# Patient Record
Sex: Female | Born: 1950
Health system: Southern US, Community
[De-identification: ages and names within clinical notes are randomized; demographics above are authoritative.]

## PROBLEM LIST (undated history)

## (undated) DIAGNOSIS — C801 Malignant (primary) neoplasm, unspecified: Secondary | ICD-10-CM

## (undated) DIAGNOSIS — B20 Human immunodeficiency virus [HIV] disease: Secondary | ICD-10-CM

## (undated) DIAGNOSIS — G4700 Insomnia, unspecified: Secondary | ICD-10-CM

## (undated) DIAGNOSIS — B009 Herpesviral infection, unspecified: Secondary | ICD-10-CM

## (undated) DIAGNOSIS — B1920 Unspecified viral hepatitis C without hepatic coma: Secondary | ICD-10-CM

## (undated) DIAGNOSIS — K829 Disease of gallbladder, unspecified: Secondary | ICD-10-CM

## (undated) DIAGNOSIS — E213 Hyperparathyroidism, unspecified: Secondary | ICD-10-CM

## (undated) DIAGNOSIS — I10 Essential (primary) hypertension: Secondary | ICD-10-CM

## (undated) DIAGNOSIS — J189 Pneumonia, unspecified organism: Secondary | ICD-10-CM

## (undated) DIAGNOSIS — J309 Allergic rhinitis, unspecified: Secondary | ICD-10-CM

## (undated) DIAGNOSIS — Z21 Asymptomatic human immunodeficiency virus [HIV] infection status: Secondary | ICD-10-CM

## (undated) DIAGNOSIS — K8050 Calculus of bile duct without cholangitis or cholecystitis without obstruction: Secondary | ICD-10-CM

## (undated) HISTORY — DX: Allergic rhinitis, unspecified: J30.9

## (undated) HISTORY — DX: Human immunodeficiency virus (HIV) disease: B20

## (undated) HISTORY — DX: Insomnia, unspecified: G47.00

## (undated) HISTORY — DX: Pneumonia, unspecified organism: J18.9

## (undated) HISTORY — DX: Calculus of bile duct without cholangitis or cholecystitis without obstruction: K80.50

## (undated) HISTORY — DX: Herpesviral infection, unspecified: B00.9

## (undated) HISTORY — DX: Hyperparathyroidism, unspecified: E21.3

## (undated) HISTORY — PX: ECTOPIC PREGNANCY SURGERY: SHX613

## (undated) HISTORY — DX: Unspecified viral hepatitis C without hepatic coma: B19.20

## (undated) HISTORY — DX: Asymptomatic human immunodeficiency virus (hiv) infection status: Z21

---

## 2006-08-02 ENCOUNTER — Emergency Department (HOSPITAL_COMMUNITY): Admission: EM | Admit: 2006-08-02 | Discharge: 2006-08-02 | Payer: Self-pay | Admitting: Emergency Medicine

## 2006-08-03 ENCOUNTER — Inpatient Hospital Stay (HOSPITAL_COMMUNITY): Admission: EM | Admit: 2006-08-03 | Discharge: 2006-08-04 | Payer: Self-pay | Admitting: Emergency Medicine

## 2006-08-03 ENCOUNTER — Ambulatory Visit: Payer: Self-pay | Admitting: Infectious Diseases

## 2006-08-04 ENCOUNTER — Encounter (INDEPENDENT_AMBULATORY_CARE_PROVIDER_SITE_OTHER): Payer: Self-pay | Admitting: Internal Medicine

## 2006-08-04 DIAGNOSIS — B20 Human immunodeficiency virus [HIV] disease: Secondary | ICD-10-CM | POA: Insufficient documentation

## 2006-08-10 ENCOUNTER — Encounter (INDEPENDENT_AMBULATORY_CARE_PROVIDER_SITE_OTHER): Payer: Self-pay | Admitting: Internal Medicine

## 2006-11-20 ENCOUNTER — Emergency Department (HOSPITAL_COMMUNITY): Admission: EM | Admit: 2006-11-20 | Discharge: 2006-11-20 | Payer: Self-pay | Admitting: Emergency Medicine

## 2007-02-06 ENCOUNTER — Emergency Department (HOSPITAL_COMMUNITY): Admission: EM | Admit: 2007-02-06 | Discharge: 2007-02-06 | Payer: Self-pay | Admitting: Emergency Medicine

## 2007-02-15 ENCOUNTER — Emergency Department (HOSPITAL_COMMUNITY): Admission: EM | Admit: 2007-02-15 | Discharge: 2007-02-15 | Payer: Self-pay | Admitting: Emergency Medicine

## 2007-03-18 ENCOUNTER — Ambulatory Visit: Payer: Self-pay | Admitting: Internal Medicine

## 2007-03-18 ENCOUNTER — Encounter: Admission: RE | Admit: 2007-03-18 | Discharge: 2007-03-18 | Payer: Self-pay | Admitting: Internal Medicine

## 2007-03-18 LAB — CONVERTED CEMR LAB
ALT: 11 units/L (ref 0–35)
AST: 40 units/L — ABNORMAL HIGH (ref 0–37)
Albumin: 2.5 g/dL — ABNORMAL LOW (ref 3.5–5.2)
Alkaline Phosphatase: 59 units/L (ref 39–117)
BUN: 13 mg/dL (ref 6–23)
Basophils Absolute: 0 10*3/uL (ref 0.0–0.1)
Basophils Relative: 0 % (ref 0–1)
CD4 Count: 180 microliters
CO2: 23 meq/L (ref 19–32)
Calcium: 8 mg/dL — ABNORMAL LOW (ref 8.4–10.5)
Chloride: 104 meq/L (ref 96–112)
Creatinine, Ser: 0.68 mg/dL (ref 0.40–1.20)
Eosinophils Absolute: 0 10*3/uL (ref 0.0–0.7)
Eosinophils Relative: 1 % (ref 0–5)
Glucose, Bld: 81 mg/dL (ref 70–99)
HCT: 33.5 % — ABNORMAL LOW (ref 36.0–46.0)
HIV 1 RNA Quant: 65200 copies/mL — ABNORMAL HIGH (ref <50)
HIV 1 RNA Quant: 652000 copies/mL
HIV-1 RNA Quant, Log: 4.81 — ABNORMAL HIGH (ref <1.70)
Hemoglobin: 10.3 g/dL — ABNORMAL LOW (ref 12.0–15.0)
Lymphocytes Relative: 39 % (ref 12–46)
Lymphs Abs: 1 10*3/uL (ref 0.7–3.3)
MCHC: 30.7 g/dL (ref 30.0–36.0)
MCV: 88.4 fL (ref 78.0–100.0)
Monocytes Absolute: 0.2 10*3/uL (ref 0.2–0.7)
Monocytes Relative: 9 % (ref 3–11)
Neutro Abs: 1.3 10*3/uL — ABNORMAL LOW (ref 1.7–7.7)
Neutrophils Relative %: 50 % (ref 43–77)
Platelets: 153 10*3/uL (ref 150–400)
Potassium: 3.7 meq/L (ref 3.5–5.3)
RBC: 3.79 M/uL — ABNORMAL LOW (ref 3.87–5.11)
RDW: 15.7 % — ABNORMAL HIGH (ref 11.5–14.0)
Sodium: 131 meq/L — ABNORMAL LOW (ref 135–145)
Total Bilirubin: 0.4 mg/dL (ref 0.3–1.2)
Total Protein: 11.6 g/dL — ABNORMAL HIGH (ref 6.0–8.3)
WBC: 2.7 10*3/uL — ABNORMAL LOW (ref 4.0–10.5)

## 2007-05-07 ENCOUNTER — Encounter (INDEPENDENT_AMBULATORY_CARE_PROVIDER_SITE_OTHER): Payer: Self-pay | Admitting: *Deleted

## 2007-05-07 ENCOUNTER — Ambulatory Visit: Payer: Self-pay | Admitting: Internal Medicine

## 2007-05-07 DIAGNOSIS — Z8709 Personal history of other diseases of the respiratory system: Secondary | ICD-10-CM | POA: Insufficient documentation

## 2007-05-07 DIAGNOSIS — G4700 Insomnia, unspecified: Secondary | ICD-10-CM | POA: Insufficient documentation

## 2007-05-07 DIAGNOSIS — B59 Pneumocystosis: Secondary | ICD-10-CM | POA: Insufficient documentation

## 2007-05-07 DIAGNOSIS — K029 Dental caries, unspecified: Secondary | ICD-10-CM | POA: Insufficient documentation

## 2007-05-07 DIAGNOSIS — Z87898 Personal history of other specified conditions: Secondary | ICD-10-CM | POA: Insufficient documentation

## 2007-05-07 DIAGNOSIS — I10 Essential (primary) hypertension: Secondary | ICD-10-CM | POA: Insufficient documentation

## 2007-05-19 ENCOUNTER — Encounter (INDEPENDENT_AMBULATORY_CARE_PROVIDER_SITE_OTHER): Payer: Self-pay | Admitting: *Deleted

## 2007-05-26 ENCOUNTER — Encounter: Payer: Self-pay | Admitting: Internal Medicine

## 2007-06-05 ENCOUNTER — Encounter: Payer: Self-pay | Admitting: Internal Medicine

## 2007-06-24 ENCOUNTER — Ambulatory Visit: Payer: Self-pay | Admitting: Internal Medicine

## 2007-06-24 ENCOUNTER — Encounter: Admission: RE | Admit: 2007-06-24 | Discharge: 2007-06-24 | Payer: Self-pay | Admitting: Internal Medicine

## 2007-06-24 LAB — CONVERTED CEMR LAB
ALT: 11 units/L (ref 0–35)
AST: 41 units/L — ABNORMAL HIGH (ref 0–37)
Albumin: 2.5 g/dL — ABNORMAL LOW (ref 3.5–5.2)
Alkaline Phosphatase: 55 units/L (ref 39–117)
BUN: 17 mg/dL (ref 6–23)
Basophils Absolute: 0 10*3/uL (ref 0.0–0.1)
Basophils Relative: 0 % (ref 0–1)
CO2: 21 meq/L (ref 19–32)
Calcium: 7.9 mg/dL — ABNORMAL LOW (ref 8.4–10.5)
Chloride: 106 meq/L (ref 96–112)
Creatinine, Ser: 0.63 mg/dL (ref 0.40–1.20)
Eosinophils Absolute: 0.1 10*3/uL (ref 0.0–0.7)
Eosinophils Relative: 2 % (ref 0–5)
Glucose, Bld: 91 mg/dL (ref 70–99)
HCT: 32.8 % — ABNORMAL LOW (ref 36.0–46.0)
HIV 1 RNA Quant: 67300 copies/mL — ABNORMAL HIGH (ref <50)
HIV-1 RNA Quant, Log: 4.83 — ABNORMAL HIGH (ref <1.70)
Hemoglobin: 10.2 g/dL — ABNORMAL LOW (ref 12.0–15.0)
Lymphocytes Relative: 37 % (ref 12–46)
Lymphs Abs: 1 10*3/uL (ref 0.7–4.0)
MCHC: 31.1 g/dL (ref 30.0–36.0)
MCV: 87.5 fL (ref 78.0–100.0)
Monocytes Absolute: 0.3 10*3/uL (ref 0.1–1.0)
Monocytes Relative: 10 % (ref 3–12)
Neutro Abs: 1.4 10*3/uL — ABNORMAL LOW (ref 1.7–7.7)
Neutrophils Relative %: 50 % (ref 43–77)
Platelets: 155 10*3/uL (ref 150–400)
Potassium: 3.6 meq/L (ref 3.5–5.3)
RBC: 3.75 M/uL — ABNORMAL LOW (ref 3.87–5.11)
RDW: 15.3 % (ref 11.5–15.5)
Sodium: 135 meq/L (ref 135–145)
Total Bilirubin: 0.4 mg/dL (ref 0.3–1.2)
Total Protein: 12.8 g/dL — ABNORMAL HIGH (ref 6.0–8.3)
WBC: 2.8 10*3/uL — ABNORMAL LOW (ref 4.0–10.5)

## 2007-07-09 ENCOUNTER — Encounter: Payer: Self-pay | Admitting: Internal Medicine

## 2007-07-09 ENCOUNTER — Encounter (INDEPENDENT_AMBULATORY_CARE_PROVIDER_SITE_OTHER): Payer: Self-pay | Admitting: *Deleted

## 2007-07-09 ENCOUNTER — Ambulatory Visit: Payer: Self-pay | Admitting: Internal Medicine

## 2007-07-09 DIAGNOSIS — J309 Allergic rhinitis, unspecified: Secondary | ICD-10-CM | POA: Insufficient documentation

## 2007-07-09 LAB — CONVERTED CEMR LAB: Pap Smear: NORMAL

## 2007-07-14 ENCOUNTER — Encounter: Payer: Self-pay | Admitting: Internal Medicine

## 2007-08-26 ENCOUNTER — Ambulatory Visit: Payer: Self-pay | Admitting: Infectious Disease

## 2007-08-26 ENCOUNTER — Encounter: Payer: Self-pay | Admitting: Internal Medicine

## 2007-08-26 ENCOUNTER — Encounter: Admission: RE | Admit: 2007-08-26 | Discharge: 2007-08-26 | Payer: Self-pay | Admitting: Infectious Disease

## 2007-08-26 LAB — CONVERTED CEMR LAB
HIV 1 RNA Quant: 62900 copies/mL — ABNORMAL HIGH (ref <50)
HIV-1 RNA Quant, Log: 4.8 — ABNORMAL HIGH (ref <1.70)

## 2007-08-27 ENCOUNTER — Encounter: Payer: Self-pay | Admitting: Internal Medicine

## 2007-08-27 LAB — CONVERTED CEMR LAB
ALT: 12 units/L (ref 0–35)
AST: 40 units/L — ABNORMAL HIGH (ref 0–37)
Albumin: 2.2 g/dL — ABNORMAL LOW (ref 3.5–5.2)
Alkaline Phosphatase: 46 units/L (ref 39–117)
BUN: 16 mg/dL (ref 6–23)
Basophils Absolute: 0 10*3/uL (ref 0.0–0.1)
Basophils Relative: 1 % (ref 0–1)
CO2: 23 meq/L (ref 19–32)
Calcium: 7.7 mg/dL — ABNORMAL LOW (ref 8.4–10.5)
Chloride: 105 meq/L (ref 96–112)
Creatinine, Ser: 0.72 mg/dL (ref 0.40–1.20)
Eosinophils Absolute: 0 10*3/uL (ref 0.0–0.7)
Eosinophils Relative: 2 % (ref 0–5)
Glucose, Bld: 56 mg/dL — ABNORMAL LOW (ref 70–99)
HCT: 31.7 % — ABNORMAL LOW (ref 36.0–46.0)
Hemoglobin: 9.9 g/dL — ABNORMAL LOW (ref 12.0–15.0)
Lymphocytes Relative: 37 % (ref 12–46)
Lymphs Abs: 1 10*3/uL (ref 0.7–4.0)
MCHC: 31.2 g/dL (ref 30.0–36.0)
MCV: 87.3 fL (ref 78.0–100.0)
Monocytes Absolute: 0.2 10*3/uL (ref 0.1–1.0)
Monocytes Relative: 7 % (ref 3–12)
Neutro Abs: 1.5 10*3/uL — ABNORMAL LOW (ref 1.7–7.7)
Neutrophils Relative %: 54 % (ref 43–77)
Platelets: 157 10*3/uL (ref 150–400)
Potassium: 3.9 meq/L (ref 3.5–5.3)
RBC: 3.63 M/uL — ABNORMAL LOW (ref 3.87–5.11)
RDW: 16.1 % — ABNORMAL HIGH (ref 11.5–15.5)
Sodium: 132 meq/L — ABNORMAL LOW (ref 135–145)
Total Bilirubin: 0.5 mg/dL (ref 0.3–1.2)
Total Protein: 11.9 g/dL — ABNORMAL HIGH (ref 6.0–8.3)
WBC: 2.7 10*3/uL — ABNORMAL LOW (ref 4.0–10.5)

## 2007-09-06 ENCOUNTER — Encounter: Payer: Self-pay | Admitting: Internal Medicine

## 2007-09-08 ENCOUNTER — Ambulatory Visit: Payer: Self-pay | Admitting: Internal Medicine

## 2007-09-08 ENCOUNTER — Encounter (INDEPENDENT_AMBULATORY_CARE_PROVIDER_SITE_OTHER): Payer: Self-pay | Admitting: *Deleted

## 2007-10-07 ENCOUNTER — Encounter (INDEPENDENT_AMBULATORY_CARE_PROVIDER_SITE_OTHER): Payer: Self-pay | Admitting: *Deleted

## 2007-10-29 ENCOUNTER — Ambulatory Visit: Payer: Self-pay | Admitting: Internal Medicine

## 2007-10-29 ENCOUNTER — Encounter: Admission: RE | Admit: 2007-10-29 | Discharge: 2007-10-29 | Payer: Self-pay | Admitting: Internal Medicine

## 2007-10-29 LAB — CONVERTED CEMR LAB
ALT: 13 units/L (ref 0–35)
AST: 40 units/L — ABNORMAL HIGH (ref 0–37)
Albumin: 2.5 g/dL — ABNORMAL LOW (ref 3.5–5.2)
Alkaline Phosphatase: 50 units/L (ref 39–117)
BUN: 14 mg/dL (ref 6–23)
Basophils Absolute: 0 10*3/uL (ref 0.0–0.1)
Basophils Relative: 1 % (ref 0–1)
CO2: 22 meq/L (ref 19–32)
Calcium: 8.2 mg/dL — ABNORMAL LOW (ref 8.4–10.5)
Chloride: 104 meq/L (ref 96–112)
Creatinine, Ser: 0.78 mg/dL (ref 0.40–1.20)
Eosinophils Absolute: 0.1 10*3/uL (ref 0.0–0.7)
Eosinophils Relative: 2 % (ref 0–5)
Glucose, Bld: 78 mg/dL (ref 70–99)
HCT: 33.4 % — ABNORMAL LOW (ref 36.0–46.0)
HCV Ab: REACTIVE — AB
HIV 1 RNA Quant: 469 copies/mL — ABNORMAL HIGH (ref <50)
HIV-1 RNA Quant, Log: 2.67 — ABNORMAL HIGH (ref <1.70)
Hemoglobin: 10.5 g/dL — ABNORMAL LOW (ref 12.0–15.0)
Hep B S Ab: NEGATIVE
Hepatitis B Surface Ag: NEGATIVE
Lymphocytes Relative: 40 % (ref 12–46)
Lymphs Abs: 1.1 10*3/uL (ref 0.7–4.0)
MCHC: 31.4 g/dL (ref 30.0–36.0)
MCV: 87.4 fL (ref 78.0–100.0)
Monocytes Absolute: 0.2 10*3/uL (ref 0.1–1.0)
Monocytes Relative: 8 % (ref 3–12)
Neutro Abs: 1.3 10*3/uL — ABNORMAL LOW (ref 1.7–7.7)
Neutrophils Relative %: 48 % (ref 43–77)
Platelets: 150 10*3/uL (ref 150–400)
Potassium: 4.2 meq/L (ref 3.5–5.3)
RBC: 3.82 M/uL — ABNORMAL LOW (ref 3.87–5.11)
RDW: 16.6 % — ABNORMAL HIGH (ref 11.5–15.5)
Sodium: 132 meq/L — ABNORMAL LOW (ref 135–145)
Total Bilirubin: 2.8 mg/dL — ABNORMAL HIGH (ref 0.3–1.2)
Total Protein: 12.1 g/dL — ABNORMAL HIGH (ref 6.0–8.3)
WBC: 2.6 10*3/uL — ABNORMAL LOW (ref 4.0–10.5)

## 2007-11-10 ENCOUNTER — Ambulatory Visit: Payer: Self-pay | Admitting: Internal Medicine

## 2007-11-10 DIAGNOSIS — M545 Low back pain, unspecified: Secondary | ICD-10-CM | POA: Insufficient documentation

## 2007-11-11 ENCOUNTER — Telehealth (INDEPENDENT_AMBULATORY_CARE_PROVIDER_SITE_OTHER): Payer: Self-pay | Admitting: *Deleted

## 2007-11-17 ENCOUNTER — Telehealth: Payer: Self-pay | Admitting: Internal Medicine

## 2008-02-15 ENCOUNTER — Encounter (INDEPENDENT_AMBULATORY_CARE_PROVIDER_SITE_OTHER): Payer: Self-pay | Admitting: *Deleted

## 2008-02-23 ENCOUNTER — Telehealth: Payer: Self-pay

## 2008-03-08 ENCOUNTER — Ambulatory Visit: Payer: Self-pay | Admitting: Internal Medicine

## 2008-03-08 LAB — CONVERTED CEMR LAB
ALT: 13 units/L (ref 0–35)
AST: 35 units/L (ref 0–37)
Albumin: 2.9 g/dL — ABNORMAL LOW (ref 3.5–5.2)
Alkaline Phosphatase: 70 units/L (ref 39–117)
BUN: 12 mg/dL (ref 6–23)
Basophils Absolute: 0 10*3/uL (ref 0.0–0.1)
Basophils Relative: 1 % (ref 0–1)
CO2: 22 meq/L (ref 19–32)
Calcium: 8 mg/dL — ABNORMAL LOW (ref 8.4–10.5)
Chloride: 105 meq/L (ref 96–112)
Creatinine, Ser: 0.77 mg/dL (ref 0.40–1.20)
Eosinophils Absolute: 0 10*3/uL (ref 0.0–0.7)
Eosinophils Relative: 1 % (ref 0–5)
Glucose, Bld: 125 mg/dL — ABNORMAL HIGH (ref 70–99)
HCT: 33.9 % — ABNORMAL LOW (ref 36.0–46.0)
HIV 1 RNA Quant: 113000 copies/mL — ABNORMAL HIGH (ref <50)
HIV-1 RNA Quant, Log: 5.05 — ABNORMAL HIGH (ref <1.70)
Hemoglobin: 11.1 g/dL — ABNORMAL LOW (ref 12.0–15.0)
Lymphocytes Relative: 39 % (ref 12–46)
Lymphs Abs: 0.8 10*3/uL (ref 0.7–4.0)
MCHC: 32.7 g/dL (ref 30.0–36.0)
MCV: 86.7 fL (ref 78.0–100.0)
Monocytes Absolute: 0.2 10*3/uL (ref 0.1–1.0)
Monocytes Relative: 8 % (ref 3–12)
Neutro Abs: 1.1 10*3/uL — ABNORMAL LOW (ref 1.7–7.7)
Neutrophils Relative %: 52 % (ref 43–77)
Platelets: 138 10*3/uL — ABNORMAL LOW (ref 150–400)
Potassium: 3.3 meq/L — ABNORMAL LOW (ref 3.5–5.3)
RBC: 3.91 M/uL (ref 3.87–5.11)
RDW: 14.1 % (ref 11.5–15.5)
Sodium: 133 meq/L — ABNORMAL LOW (ref 135–145)
Total Bilirubin: 0.7 mg/dL (ref 0.3–1.2)
Total Protein: 10.9 g/dL — ABNORMAL HIGH (ref 6.0–8.3)
WBC: 2.1 10*3/uL — ABNORMAL LOW (ref 4.0–10.5)

## 2008-03-13 ENCOUNTER — Encounter: Payer: Self-pay | Admitting: Internal Medicine

## 2008-03-22 ENCOUNTER — Ambulatory Visit: Payer: Self-pay | Admitting: Internal Medicine

## 2008-03-27 LAB — CONVERTED CEMR LAB
Chlamydia, Swab/Urine, PCR: NEGATIVE
GC Probe Amp, Urine: NEGATIVE

## 2008-04-06 ENCOUNTER — Telehealth (INDEPENDENT_AMBULATORY_CARE_PROVIDER_SITE_OTHER): Payer: Self-pay | Admitting: *Deleted

## 2008-04-07 ENCOUNTER — Ambulatory Visit: Payer: Self-pay | Admitting: Internal Medicine

## 2008-04-07 DIAGNOSIS — R5381 Other malaise: Secondary | ICD-10-CM | POA: Insufficient documentation

## 2008-04-07 DIAGNOSIS — R5383 Other fatigue: Secondary | ICD-10-CM

## 2008-04-07 DIAGNOSIS — N898 Other specified noninflammatory disorders of vagina: Secondary | ICD-10-CM | POA: Insufficient documentation

## 2008-04-10 LAB — CONVERTED CEMR LAB
Candida species: NEGATIVE
Gardnerella vaginalis: POSITIVE — AB
Trichomonal Vaginitis: NEGATIVE

## 2008-04-21 ENCOUNTER — Encounter (INDEPENDENT_AMBULATORY_CARE_PROVIDER_SITE_OTHER): Payer: Self-pay | Admitting: Licensed Clinical Social Worker

## 2008-05-02 ENCOUNTER — Ambulatory Visit: Payer: Self-pay | Admitting: Internal Medicine

## 2008-05-02 LAB — CONVERTED CEMR LAB
ALT: 8 units/L (ref 0–35)
AST: 25 units/L (ref 0–37)
Albumin: 2.7 g/dL — ABNORMAL LOW (ref 3.5–5.2)
Alkaline Phosphatase: 58 units/L (ref 39–117)
BUN: 9 mg/dL (ref 6–23)
Basophils Absolute: 0 10*3/uL (ref 0.0–0.1)
Basophils Relative: 1 % (ref 0–1)
CO2: 20 meq/L (ref 19–32)
Calcium: 7.6 mg/dL — ABNORMAL LOW (ref 8.4–10.5)
Chloride: 105 meq/L (ref 96–112)
Cholesterol: 93 mg/dL (ref 0–200)
Creatinine, Ser: 0.77 mg/dL (ref 0.40–1.20)
Eosinophils Absolute: 0.1 10*3/uL (ref 0.0–0.7)
Eosinophils Relative: 2 % (ref 0–5)
Glucose, Bld: 78 mg/dL (ref 70–99)
HCT: 33.9 % — ABNORMAL LOW (ref 36.0–46.0)
HDL: 25 mg/dL — ABNORMAL LOW (ref >39)
HIV 1 RNA Quant: 63 copies/mL — ABNORMAL HIGH (ref <48)
HIV-1 RNA Quant, Log: 1.8 — ABNORMAL HIGH (ref <1.68)
Hemoglobin: 10.6 g/dL — ABNORMAL LOW (ref 12.0–15.0)
LDL Cholesterol: 54 mg/dL (ref 0–99)
Lymphocytes Relative: 35 % (ref 12–46)
Lymphs Abs: 0.9 10*3/uL (ref 0.7–4.0)
MCHC: 31.3 g/dL (ref 30.0–36.0)
MCV: 88.5 fL (ref 78.0–100.0)
Monocytes Absolute: 0.2 10*3/uL (ref 0.1–1.0)
Monocytes Relative: 8 % (ref 3–12)
Neutro Abs: 1.3 10*3/uL — ABNORMAL LOW (ref 1.7–7.7)
Neutrophils Relative %: 54 % (ref 43–77)
Platelets: 176 10*3/uL (ref 150–400)
Potassium: 3.8 meq/L (ref 3.5–5.3)
RBC: 3.83 M/uL — ABNORMAL LOW (ref 3.87–5.11)
RDW: 15.3 % (ref 11.5–15.5)
Sodium: 133 meq/L — ABNORMAL LOW (ref 135–145)
Total Bilirubin: 0.6 mg/dL (ref 0.3–1.2)
Total CHOL/HDL Ratio: 3.7
Total Protein: 11 g/dL — ABNORMAL HIGH (ref 6.0–8.3)
Triglycerides: 71 mg/dL (ref <150)
VLDL: 14 mg/dL (ref 0–40)
WBC: 2.4 10*3/uL — ABNORMAL LOW (ref 4.0–10.5)

## 2008-05-24 ENCOUNTER — Ambulatory Visit: Payer: Self-pay | Admitting: Internal Medicine

## 2008-06-01 ENCOUNTER — Telehealth: Payer: Self-pay | Admitting: Internal Medicine

## 2008-06-06 ENCOUNTER — Encounter: Payer: Self-pay | Admitting: Internal Medicine

## 2008-06-21 ENCOUNTER — Encounter (INDEPENDENT_AMBULATORY_CARE_PROVIDER_SITE_OTHER): Payer: Self-pay | Admitting: *Deleted

## 2008-08-23 ENCOUNTER — Ambulatory Visit: Payer: Self-pay | Admitting: Internal Medicine

## 2008-08-23 LAB — CONVERTED CEMR LAB
ALT: 10 units/L (ref 0–35)
AST: 37 units/L (ref 0–37)
Albumin: 2.8 g/dL — ABNORMAL LOW (ref 3.5–5.2)
Alkaline Phosphatase: 69 units/L (ref 39–117)
BUN: 12 mg/dL (ref 6–23)
Basophils Absolute: 0 10*3/uL (ref 0.0–0.1)
Basophils Relative: 1 % (ref 0–1)
CO2: 24 meq/L (ref 19–32)
Calcium: 7.9 mg/dL — ABNORMAL LOW (ref 8.4–10.5)
Chloride: 107 meq/L (ref 96–112)
Creatinine, Ser: 0.81 mg/dL (ref 0.40–1.20)
Eosinophils Absolute: 0 10*3/uL (ref 0.0–0.7)
Eosinophils Relative: 1 % (ref 0–5)
GFR calc Af Amer: >60 mL/min (ref >60)
GFR calc non Af Amer: >60 mL/min (ref >60)
Glucose, Bld: 100 mg/dL — ABNORMAL HIGH (ref 70–99)
HCT: 29.9 % — ABNORMAL LOW (ref 36.0–46.0)
HIV 1 RNA Quant: 71800 copies/mL — ABNORMAL HIGH (ref <48)
HIV-1 RNA Quant, Log: 4.86 — ABNORMAL HIGH (ref <1.68)
Hemoglobin: 10.3 g/dL — ABNORMAL LOW (ref 12.0–15.0)
Lymphocytes Relative: 43 % (ref 12–46)
Lymphs Abs: 1 10*3/uL (ref 0.7–4.0)
MCHC: 34.4 g/dL (ref 30.0–36.0)
MCV: 86.7 fL (ref 78.0–100.0)
Monocytes Absolute: 0.2 10*3/uL (ref 0.1–1.0)
Monocytes Relative: 7 % (ref 3–12)
Neutro Abs: 1.1 10*3/uL — ABNORMAL LOW (ref 1.7–7.7)
Neutrophils Relative %: 48 % (ref 43–77)
Platelets: 137 10*3/uL — ABNORMAL LOW (ref 150–400)
Potassium: 3.6 meq/L (ref 3.5–5.3)
RBC: 3.45 M/uL — ABNORMAL LOW (ref 3.87–5.11)
RDW: 14 % (ref 11.5–15.5)
Sodium: 139 meq/L (ref 135–145)
Total Bilirubin: 0.5 mg/dL (ref 0.3–1.2)
Total Protein: 10.7 g/dL — ABNORMAL HIGH (ref 6.0–8.3)
WBC: 2.3 10*3/uL — ABNORMAL LOW (ref 4.0–10.5)

## 2008-09-06 ENCOUNTER — Ambulatory Visit: Payer: Self-pay | Admitting: Internal Medicine

## 2008-09-15 ENCOUNTER — Encounter: Payer: Self-pay | Admitting: Internal Medicine

## 2008-09-15 ENCOUNTER — Ambulatory Visit: Payer: Self-pay | Admitting: Internal Medicine

## 2008-09-22 ENCOUNTER — Encounter: Payer: Self-pay | Admitting: Internal Medicine

## 2008-09-25 ENCOUNTER — Emergency Department (HOSPITAL_COMMUNITY): Admission: EM | Admit: 2008-09-25 | Discharge: 2008-09-26 | Payer: Self-pay | Admitting: Emergency Medicine

## 2008-10-04 ENCOUNTER — Telehealth (INDEPENDENT_AMBULATORY_CARE_PROVIDER_SITE_OTHER): Payer: Self-pay | Admitting: *Deleted

## 2008-12-05 ENCOUNTER — Ambulatory Visit: Payer: Self-pay | Admitting: Internal Medicine

## 2008-12-05 LAB — CONVERTED CEMR LAB
ALT: 58 units/L — ABNORMAL HIGH (ref 0–35)
AST: 118 units/L — ABNORMAL HIGH (ref 0–37)
Albumin: 2.9 g/dL — ABNORMAL LOW (ref 3.5–5.2)
Alkaline Phosphatase: 81 units/L (ref 39–117)
BUN: 12 mg/dL (ref 6–23)
Basophils Absolute: 0 10*3/uL (ref 0.0–0.1)
Basophils Relative: 0 % (ref 0–1)
CO2: 23 meq/L (ref 19–32)
Calcium: 8.3 mg/dL — ABNORMAL LOW (ref 8.4–10.5)
Chloride: 102 meq/L (ref 96–112)
Creatinine, Ser: 0.95 mg/dL (ref 0.40–1.20)
Eosinophils Absolute: 0 10*3/uL (ref 0.0–0.7)
Eosinophils Relative: 0 % (ref 0–5)
Glucose, Bld: 96 mg/dL (ref 70–99)
HCT: 37.2 % (ref 36.0–46.0)
HIV 1 RNA Quant: <48 copies/mL (ref <48)
HIV-1 RNA Quant, Log: <1.68 (ref <1.68)
Hemoglobin: 11.7 g/dL — ABNORMAL LOW (ref 12.0–15.0)
Lymphocytes Relative: 29 % (ref 12–46)
Lymphs Abs: 0.7 10*3/uL (ref 0.7–4.0)
MCHC: 31.5 g/dL (ref 30.0–36.0)
MCV: 91.2 fL (ref 78.0–100.0)
Monocytes Absolute: 0.2 10*3/uL (ref 0.1–1.0)
Monocytes Relative: 7 % (ref 3–12)
Neutro Abs: 1.4 10*3/uL — ABNORMAL LOW (ref 1.7–7.7)
Neutrophils Relative %: 63 % (ref 43–77)
Platelets: 140 10*3/uL — ABNORMAL LOW (ref 150–400)
Potassium: 3.6 meq/L (ref 3.5–5.3)
RBC: 4.08 M/uL (ref 3.87–5.11)
RDW: 15.4 % (ref 11.5–15.5)
Sodium: 132 meq/L — ABNORMAL LOW (ref 135–145)
Total Bilirubin: 0.5 mg/dL (ref 0.3–1.2)
Total Protein: 11.2 g/dL — ABNORMAL HIGH (ref 6.0–8.3)
WBC: 2.3 10*3/uL — ABNORMAL LOW (ref 4.0–10.5)

## 2008-12-19 ENCOUNTER — Ambulatory Visit: Payer: Self-pay | Admitting: Internal Medicine

## 2008-12-19 DIAGNOSIS — B182 Chronic viral hepatitis C: Secondary | ICD-10-CM | POA: Insufficient documentation

## 2008-12-21 ENCOUNTER — Telehealth (INDEPENDENT_AMBULATORY_CARE_PROVIDER_SITE_OTHER): Payer: Self-pay | Admitting: *Deleted

## 2008-12-29 ENCOUNTER — Telehealth (INDEPENDENT_AMBULATORY_CARE_PROVIDER_SITE_OTHER): Payer: Self-pay | Admitting: *Deleted

## 2009-01-19 ENCOUNTER — Ambulatory Visit (HOSPITAL_COMMUNITY): Admission: RE | Admit: 2009-01-19 | Discharge: 2009-01-19 | Payer: Self-pay | Admitting: Internal Medicine

## 2009-03-20 ENCOUNTER — Ambulatory Visit: Payer: Self-pay | Admitting: Internal Medicine

## 2009-03-20 LAB — CONVERTED CEMR LAB
ALT: 61 units/L — ABNORMAL HIGH (ref 0–35)
AST: 119 units/L — ABNORMAL HIGH (ref 0–37)
Albumin: 3.3 g/dL — ABNORMAL LOW (ref 3.5–5.2)
Alkaline Phosphatase: 81 units/L (ref 39–117)
BUN: 12 mg/dL (ref 6–23)
Basophils Absolute: 0 10*3/uL (ref 0.0–0.1)
Basophils Relative: 0 % (ref 0–1)
CO2: 21 meq/L (ref 19–32)
Calcium: 8.7 mg/dL (ref 8.4–10.5)
Chloride: 105 meq/L (ref 96–112)
Cholesterol: 109 mg/dL (ref 0–200)
Creatinine, Ser: 0.9 mg/dL (ref 0.40–1.20)
Eosinophils Absolute: 0 10*3/uL (ref 0.0–0.7)
Eosinophils Relative: 1 % (ref 0–5)
Glucose, Bld: 81 mg/dL (ref 70–99)
HCT: 34.7 % — ABNORMAL LOW (ref 36.0–46.0)
HDL: 38 mg/dL — ABNORMAL LOW (ref >39)
HIV 1 RNA Quant: <48 copies/mL (ref <48)
HIV-1 RNA Quant, Log: <1.68 (ref <1.68)
Hemoglobin: 11.2 g/dL — ABNORMAL LOW (ref 12.0–15.0)
LDL Cholesterol: 52 mg/dL (ref 0–99)
Lymphocytes Relative: 25 % (ref 12–46)
Lymphs Abs: 0.6 10*3/uL — ABNORMAL LOW (ref 0.7–4.0)
MCHC: 32.3 g/dL (ref 30.0–36.0)
MCV: 92.5 fL (ref >=78.0)
Monocytes Absolute: 0.3 10*3/uL (ref 0.1–1.0)
Monocytes Relative: 11 % (ref 3–12)
Neutro Abs: 1.4 10*3/uL — ABNORMAL LOW (ref 1.7–7.7)
Neutrophils Relative %: 62 % (ref 43–77)
Platelets: 159 10*3/uL (ref 150–400)
Potassium: 3.7 meq/L (ref 3.5–5.3)
RBC: 3.75 M/uL — ABNORMAL LOW (ref 3.87–5.11)
RDW: 14.1 % (ref 11.5–15.5)
Sodium: 136 meq/L (ref 135–145)
Total Bilirubin: 0.6 mg/dL (ref 0.3–1.2)
Total CHOL/HDL Ratio: 2.9
Total Protein: 10.7 g/dL — ABNORMAL HIGH (ref 6.0–8.3)
Triglycerides: 97 mg/dL (ref <150)
VLDL: 19 mg/dL (ref 0–40)
WBC: 2.2 10*3/uL — ABNORMAL LOW (ref 4.0–10.5)

## 2009-04-04 ENCOUNTER — Ambulatory Visit: Payer: Self-pay | Admitting: Internal Medicine

## 2009-06-05 ENCOUNTER — Telehealth (INDEPENDENT_AMBULATORY_CARE_PROVIDER_SITE_OTHER): Payer: Self-pay | Admitting: *Deleted

## 2009-06-06 ENCOUNTER — Encounter: Payer: Self-pay | Admitting: Internal Medicine

## 2009-07-02 ENCOUNTER — Ambulatory Visit: Payer: Self-pay | Admitting: Internal Medicine

## 2009-07-02 LAB — CONVERTED CEMR LAB
ALT: 11 units/L (ref 0–35)
AST: 27 units/L (ref 0–37)
Albumin: 3.5 g/dL (ref 3.5–5.2)
Alkaline Phosphatase: 87 units/L (ref 39–117)
BUN: 15 mg/dL (ref 6–23)
Basophils Absolute: 0 10*3/uL (ref 0.0–0.1)
Basophils Relative: 0 % (ref 0–1)
CO2: 26 meq/L (ref 19–32)
Calcium: 8.9 mg/dL (ref 8.4–10.5)
Chloride: 104 meq/L (ref 96–112)
Creatinine, Ser: 0.77 mg/dL (ref 0.40–1.20)
Eosinophils Absolute: 0 10*3/uL (ref 0.0–0.7)
Eosinophils Relative: 2 % (ref 0–5)
Glucose, Bld: 88 mg/dL (ref 70–99)
HCT: 36.2 % (ref 36.0–46.0)
HIV 1 RNA Quant: <48 copies/mL (ref <48)
HIV-1 RNA Quant, Log: <1.68 (ref <1.68)
Hemoglobin: 11.8 g/dL — ABNORMAL LOW (ref 12.0–15.0)
Lymphocytes Relative: 31 % (ref 12–46)
Lymphs Abs: 0.8 10*3/uL (ref 0.7–4.0)
MCHC: 32.6 g/dL (ref 30.0–36.0)
MCV: 89.4 fL (ref >=78.0)
Monocytes Absolute: 0.3 10*3/uL (ref 0.1–1.0)
Monocytes Relative: 9 % (ref 3–12)
Neutro Abs: 1.5 10*3/uL — ABNORMAL LOW (ref 1.7–7.7)
Neutrophils Relative %: 58 % (ref 43–77)
Platelets: 168 10*3/uL (ref 150–400)
Potassium: 4.3 meq/L (ref 3.5–5.3)
RBC: 4.05 M/uL (ref 3.87–5.11)
RDW: 13.5 % (ref 11.5–15.5)
Sodium: 136 meq/L (ref 135–145)
Total Bilirubin: 0.6 mg/dL (ref 0.3–1.2)
Total Protein: 10.4 g/dL — ABNORMAL HIGH (ref 6.0–8.3)
WBC: 2.7 10*3/uL — ABNORMAL LOW (ref 4.0–10.5)

## 2009-07-18 ENCOUNTER — Ambulatory Visit: Payer: Self-pay | Admitting: Internal Medicine

## 2009-07-18 DIAGNOSIS — R05 Cough: Secondary | ICD-10-CM | POA: Insufficient documentation

## 2009-07-18 DIAGNOSIS — R059 Cough, unspecified: Secondary | ICD-10-CM | POA: Insufficient documentation

## 2009-10-17 ENCOUNTER — Ambulatory Visit: Payer: Self-pay | Admitting: Internal Medicine

## 2009-10-17 LAB — CONVERTED CEMR LAB
ALT: 14 units/L (ref 0–35)
AST: 28 units/L (ref 0–37)
Albumin: 3.2 g/dL — ABNORMAL LOW (ref 3.5–5.2)
Alkaline Phosphatase: 91 units/L (ref 39–117)
BUN: 13 mg/dL (ref 6–23)
Basophils Absolute: 0 10*3/uL (ref 0.0–0.1)
Basophils Relative: 1 % (ref 0–1)
CO2: 24 meq/L (ref 19–32)
Calcium: 8.9 mg/dL (ref 8.4–10.5)
Chloride: 107 meq/L (ref 96–112)
Creatinine, Ser: 0.81 mg/dL (ref 0.40–1.20)
Eosinophils Absolute: 0.1 10*3/uL (ref 0.0–0.7)
Eosinophils Relative: 2 % (ref 0–5)
Glucose, Bld: 94 mg/dL (ref 70–99)
HCT: 36.1 % (ref 36.0–46.0)
HIV 1 RNA Quant: <48 copies/mL (ref <48)
HIV-1 RNA Quant, Log: <1.68 (ref <1.68)
Hemoglobin: 11.6 g/dL — ABNORMAL LOW (ref 12.0–15.0)
Lymphocytes Relative: 28 % (ref 12–46)
Lymphs Abs: 0.9 10*3/uL (ref 0.7–4.0)
MCHC: 32.1 g/dL (ref 30.0–36.0)
MCV: 90.7 fL (ref 78.0–100.0)
Monocytes Absolute: 0.2 10*3/uL (ref 0.1–1.0)
Monocytes Relative: 8 % (ref 3–12)
Neutro Abs: 1.9 10*3/uL (ref 1.7–7.7)
Neutrophils Relative %: 62 % (ref 43–77)
Platelets: 159 10*3/uL (ref 150–400)
Potassium: 3.5 meq/L (ref 3.5–5.3)
RBC: 3.98 M/uL (ref 3.87–5.11)
RDW: 14.1 % (ref 11.5–15.5)
Sodium: 139 meq/L (ref 135–145)
Total Bilirubin: 0.5 mg/dL (ref 0.3–1.2)
Total Protein: 8.9 g/dL — ABNORMAL HIGH (ref 6.0–8.3)
WBC: 3.1 10*3/uL — ABNORMAL LOW (ref 4.0–10.5)

## 2009-10-31 ENCOUNTER — Ambulatory Visit: Payer: Self-pay | Admitting: Internal Medicine

## 2009-11-09 ENCOUNTER — Telehealth (INDEPENDENT_AMBULATORY_CARE_PROVIDER_SITE_OTHER): Payer: Self-pay | Admitting: *Deleted

## 2009-12-21 ENCOUNTER — Ambulatory Visit: Payer: Self-pay | Admitting: Infectious Diseases

## 2009-12-21 DIAGNOSIS — R8789 Other abnormal findings in specimens from female genital organs: Secondary | ICD-10-CM | POA: Insufficient documentation

## 2010-01-01 ENCOUNTER — Encounter: Payer: Self-pay | Admitting: Internal Medicine

## 2010-01-08 ENCOUNTER — Encounter: Payer: Self-pay | Admitting: Internal Medicine

## 2010-01-08 LAB — CONVERTED CEMR LAB

## 2010-02-06 ENCOUNTER — Ambulatory Visit: Payer: Self-pay | Admitting: Internal Medicine

## 2010-02-06 LAB — CONVERTED CEMR LAB
ALT: 12 units/L (ref 0–35)
AST: 30 units/L (ref 0–37)
Albumin: 3.9 g/dL (ref 3.5–5.2)
Alkaline Phosphatase: 90 units/L (ref 39–117)
BUN: 13 mg/dL (ref 6–23)
Basophils Absolute: 0.1 10*3/uL (ref 0.0–0.1)
Basophils Relative: 1 % (ref 0–1)
CO2: 23 meq/L (ref 19–32)
Calcium: 9 mg/dL (ref 8.4–10.5)
Chloride: 104 meq/L (ref 96–112)
Creatinine, Ser: 0.8 mg/dL (ref 0.40–1.20)
Eosinophils Absolute: 0 10*3/uL (ref 0.0–0.7)
Eosinophils Relative: 1 % (ref 0–5)
Glucose, Bld: 83 mg/dL (ref 70–99)
HCT: 39.7 % (ref 36.0–46.0)
HIV 1 RNA Quant: <20 copies/mL (ref <20)
HIV-1 RNA Quant, Log: <1.3 (ref <1.30)
Hemoglobin: 12.8 g/dL (ref 12.0–15.0)
Lymphocytes Relative: 27 % (ref 12–46)
Lymphs Abs: 1.2 10*3/uL (ref 0.7–4.0)
MCHC: 32.2 g/dL (ref 30.0–36.0)
MCV: 90.2 fL (ref 78.0–100.0)
Monocytes Absolute: 0.3 10*3/uL (ref 0.1–1.0)
Monocytes Relative: 7 % (ref 3–12)
Neutro Abs: 2.8 10*3/uL (ref 1.7–7.7)
Neutrophils Relative %: 64 % (ref 43–77)
Platelets: 157 10*3/uL (ref 150–400)
Potassium: 3.9 meq/L (ref 3.5–5.3)
RBC: 4.4 M/uL (ref 3.87–5.11)
RDW: 13.2 % (ref 11.5–15.5)
Sodium: 139 meq/L (ref 135–145)
Total Bilirubin: 0.9 mg/dL (ref 0.3–1.2)
Total Protein: 9.9 g/dL — ABNORMAL HIGH (ref 6.0–8.3)
WBC: 4.3 10*3/uL (ref 4.0–10.5)

## 2010-02-11 ENCOUNTER — Telehealth: Payer: Self-pay | Admitting: Internal Medicine

## 2010-02-12 ENCOUNTER — Ambulatory Visit: Payer: Self-pay | Admitting: Internal Medicine

## 2010-03-01 ENCOUNTER — Telehealth (INDEPENDENT_AMBULATORY_CARE_PROVIDER_SITE_OTHER): Payer: Self-pay | Admitting: *Deleted

## 2010-03-13 ENCOUNTER — Ambulatory Visit: Payer: Self-pay | Admitting: Obstetrics and Gynecology

## 2010-03-13 ENCOUNTER — Other Ambulatory Visit: Admission: RE | Admit: 2010-03-13 | Discharge: 2010-03-13 | Payer: Self-pay | Admitting: Obstetrics and Gynecology

## 2010-06-11 ENCOUNTER — Encounter: Payer: Self-pay | Admitting: Infectious Diseases

## 2010-06-11 ENCOUNTER — Ambulatory Visit
Admission: RE | Admit: 2010-06-11 | Discharge: 2010-06-11 | Payer: Self-pay | Source: Home / Self Care | Attending: Infectious Diseases | Admitting: Infectious Diseases

## 2010-06-11 ENCOUNTER — Encounter: Payer: Self-pay | Admitting: Internal Medicine

## 2010-06-11 LAB — CONVERTED CEMR LAB
ALT: 10 units/L (ref 0–35)
AST: 23 units/L (ref 0–37)
Albumin: 3.8 g/dL (ref 3.5–5.2)
Alkaline Phosphatase: 105 units/L (ref 39–117)
BUN: 14 mg/dL (ref 6–23)
Basophils Absolute: 0 10*3/uL (ref 0.0–0.1)
Basophils Relative: 0 % (ref 0–1)
CO2: 26 meq/L (ref 19–32)
Calcium: 9.5 mg/dL (ref 8.4–10.5)
Chloride: 106 meq/L (ref 96–112)
Creatinine, Ser: 1.1 mg/dL (ref 0.40–1.20)
Eosinophils Absolute: 0 10*3/uL (ref 0.0–0.7)
Eosinophils Relative: 1 % (ref 0–5)
Glucose, Bld: 98 mg/dL (ref 70–99)
HCT: 38.6 % (ref 36.0–46.0)
HIV 1 RNA Quant: <20 copies/mL (ref <20)
HIV-1 RNA Quant, Log: <1.3 (ref <1.30)
Hemoglobin: 12.6 g/dL (ref 12.0–15.0)
Lymphocytes Relative: 27 % (ref 12–46)
Lymphs Abs: 1.3 10*3/uL (ref 0.7–4.0)
MCHC: 32.6 g/dL (ref 30.0–36.0)
MCV: 88.5 fL (ref 78.0–100.0)
Monocytes Absolute: 0.2 10*3/uL (ref 0.1–1.0)
Monocytes Relative: 5 % (ref 3–12)
Neutro Abs: 3.2 10*3/uL (ref 1.7–7.7)
Neutrophils Relative %: 68 % (ref 43–77)
Platelets: 185 10*3/uL (ref 150–400)
Potassium: 4.9 meq/L (ref 3.5–5.3)
RBC: 4.36 M/uL (ref 3.87–5.11)
RDW: 13.5 % (ref 11.5–15.5)
Sodium: 139 meq/L (ref 135–145)
Total Bilirubin: 0.7 mg/dL (ref 0.3–1.2)
Total Protein: 9.3 g/dL — ABNORMAL HIGH (ref 6.0–8.3)
WBC: 4.8 10*3/uL (ref 4.0–10.5)

## 2010-06-12 LAB — T-HELPER CELL (CD4) - (RCID CLINIC ONLY)
CD4 % Helper T Cell: 23 % — ABNORMAL LOW (ref 33–55)
CD4 T Cell Abs: 280 uL — ABNORMAL LOW (ref 400–2700)

## 2010-06-18 NOTE — Progress Notes (Signed)
Summary: Not taking Mepron  Phone Note Call from Patient   Caller: Patient Reason for Call: Talk to Doctor Summary of Call: Patient called this morning because she forgot to ask Dr. Philipp Deputy at her visit why she is still taking the Mepron suspension.  I informed her that her doctor has her taking this medication as an antibiotic.  I also stated that she must be taking this particular med because her counts may be low.  She informed me that she just cannont take this medication any longer and said that she has not be taking it for sometime now.  I told her that I will inform her doctor.  Please let me know if you want me to discontinue medication so I may let the pharmacy know not to keep sending medication under [place]. Initial call taken by: Paulo Fruit  BS,CPht II,MPH,  November 11, 2007 9:15 AM  Follow-up for Phone Call        she shoul be taking to prevent PCP because CD4ct is <200 and she is allergic to bactrim and dapsone Follow-up by: Yisroel Ramming MD,  November 11, 2007 10:18 AM  Additional Follow-up for Phone Call Additional follow up Details #1::        Tried to contact patient to reiterate the same information that you have dictated about the Mepron.  I told her this same info when she called the first time, but wanted to make you aware that she has not been taking it.  I left her a message to contact Byrd Hesselbach at [phone] or Tammy at [phone]. (We will reemphasize the importance). Additional Follow-up by: Paulo Fruit  BS,CPht II,MPH,  November 11, 2007 10:47 AM

## 2010-06-18 NOTE — Miscellaneous (Signed)
Summary: Orders Update - GYN Referral  Clinical Lists Changes  Problems: Added new problem of PAP SMEAR, LGSIL, ABNORMAL (ICD-795.09) Orders: Added new Referral order of Gynecologic Referral (Gyn) - Signed

## 2010-06-18 NOTE — Miscellaneous (Signed)
Summary: RW  Clinical Lists Changes  Observations: Added new observation of YEARAIDSPOS: AIDS 2008 (05/26/2007 16:49)

## 2010-06-18 NOTE — Medication Information (Signed)
Summary: Denial  Denial   Imported By: Florinda Marker 06/06/2009 15:23:23  _____________________________________________________________________  External Attachment:    Type:   Image     Comment:   External Document

## 2010-06-18 NOTE — Assessment & Plan Note (Signed)
Summary: 3 MONTH F/U/CH   CC:  follow-up visit and chest congestion and cough.  History of Present Illness: Pt had a bad cold a couple of weeks ago. she still had a cough taht is productive of [place] phlegm.  No fever. No missed doses of her HIV meds.  Preventive Screening-Counseling & Management  Alcohol-Tobacco     Alcohol drinks/day: 0     Alcohol type: wine, occass     Smoking Status: never     Year Quit: 40 years ago, occassionally     Passive Smoke Exposure: yes  Caffeine-Diet-Exercise     Caffeine use/day: 2     Does Patient Exercise: yes     Type of exercise: walking at work, CNA     Times/week: 5  Hep-HIV-STD-Contraception     HIV Risk: no  Safety-Violence-Falls     Seat Belt Use: yes      Sexual History:  currently monogamous.        Drug Use:  never.        Blood Transfusions:  no.        Travel History:  no.     Updated Prior Medication List: AMBIEN 10 MG TABS (ZOLPIDEM TARTRATE) Take 1 tablet by mouth at bedtime TRUVADA 200-300 MG TABS (EMTRICITABINE-TENOFOVIR) Take 1 tablet by mouth once a day ISENTRESS 400 MG TABS (RALTEGRAVIR POTASSIUM) Take 1 tablet by mouth two times a day NORVASC 10 MG TABS (AMLODIPINE BESYLATE) Take 1 tablet by mouth once a day  Current Allergies (reviewed today): ! BACTRIM ! * DAPSONE ! * AZT Past History:  Past Medical History: Last updated: 05/07/2007 HIV disease Hypertension Pneumonia, hx of  Review of Systems  The patient denies anorexia, fever, weight loss, dyspnea on exertion, and hemoptysis.    Vital Signs:  Patient profile:   60 year old female Menstrual status:  postmenopausal Height:      60 inches (152.40 cm) Weight:      146.2 pounds (66.45 kg) BMI:     28.66 Temp:     97.6 degrees F (36.44 degrees C) oral Pulse rate:   70 / minute BP sitting:   137 / 88  (left arm)  Vitals Entered By: Wendall Mola CMA Duncan Dull) (July 18, 2009 3:35 PM) CC: follow-up visit, chest congestion and cough Is  Patient Diabetic? No Pain Assessment Patient in pain? no      Nutritional Status BMI of 25 - 29 = overweight Nutritional Status Detail appetite "good"  Does patient need assistance? Functional Status Self care Ambulation Normal Comments no missed doses of meds   Physical Exam  General:  alert, well-developed, well-nourished, and well-hydrated.   Head:  normocephalic and atraumatic.   Mouth:  pharynx pink and moist.   Lungs:  scattered expiratory wheezesno crackles.      Impression & Recommendations:  Problem # 1:  HIV DISEASE (ICD-042) Pt.s most recent CD4ct was 220 and VL <48 .  Pt instructed to continue the current antiretroviral regimen.  Pt encouraged to take medication regularly and not miss doses.  Pt will f/u in 3 months for repeat blood work and will see me 2 weeks later.  Diagnostics Reviewed:  HIV: CDC-defined AIDS (12/19/2008)   CD4: 220 (07/03/2009)   WBC: 2.7 (07/02/2009)   Hgb: 11.8 (07/02/2009)   HCT: 36.2 (07/02/2009)   Platelets: 168 (07/02/2009) HIV-1 RNA: <48 copies/mL (07/02/2009)   HBSAg: NEG (10/29/2007)  Problem # 2:  COUGH (ICD-786.2) will continue mucinex albuteral HFA inhaler smaple  given - instructions on use given  Other Orders: Est. Patient Level III (78295) Future Orders: T-CD4SP ([facility]) (CD4SP) ... 10/16/2009 T-HIV Viral Load [phone]) ... 10/16/2009 T-Comprehensive Metabolic Panel [phone]) ... 10/16/2009 T-CBC w/Diff ([id number]) ... 10/16/2009 T-RPR (Syphilis) [phone]) ... 10/16/2009  Patient Instructions: 1)  Please schedule a follow-up appointment in 3 months, 2 weeks after labs.  Process Orders Check Orders Results:     [facility]: ABN not required for this insurance Tests Sent for requisitioning (July 18, 2009 3:55 PM):     10/16/2009: [facility] -- T-HIV Viral Load [phone] (signed)     10/16/2009: [facility] -- T-Comprehensive Metabolic Panel  [[id number]] (signed)     10/16/2009: [facility] -- T-CBC w/Diff [[id number]] (signed)     10/16/2009: [facility] -- T-RPR (Syphilis) [phone] (signed)

## 2010-06-18 NOTE — Progress Notes (Signed)
Summary: Outgoing call to pt-#disconnected  Phone Note Outgoing Call   Call placed by: Paulo Fruit  BS,CPht II,MPH,  June 05, 2009 12:35 PM Call placed to: Patient Details for Reason: give patient information regarding aDAP status Summary of Call: Tried to contact patient , however her phone is temporarily disconnected.  Received letter from [employer] denying her application because she is Medicaid eligible.  She needs to go and apply for it.  If denied, we can re do antoher ADAP application and send the Medicaid denial letter along with it.  She is still good to receive medication until 08/16/09. Initial call taken by: Paulo Fruit  BS,CPht II,MPH,  June 05, 2009 12:37 PM

## 2010-06-18 NOTE — Miscellaneous (Signed)
Summary: clinical update/ryan Umstead  Clinical Lists Changes  Observations: Added new observation of AIDSDAP: Yes 2009 (10/07/2007 10:42) Added new observation of PAYOR: No Insurance (10/07/2007 10:42)

## 2010-06-18 NOTE — Assessment & Plan Note (Signed)
Summary: F/U/VS   CC:  follow-up visit, lab results, B/P high, has not taken Norvasc for two days, refill Ambien, and requests B12 shot.  History of Present Illness: Pt here for lab results. She has been feeling well.  No missed doses of  her HIV meds.  Preventive Screening-Counseling & Management  Alcohol-Tobacco     Alcohol drinks/day: 0     Alcohol type: wine, occass     Smoking Status: never     Year Quit: 40 years ago, occassionally     Passive Smoke Exposure: yes  Caffeine-Diet-Exercise     Caffeine use/day: occasional     Does Patient Exercise: yes     Type of exercise: walking at work, CNA     Times/week: 5  Hep-HIV-STD-Contraception     HIV Risk: no  Safety-Violence-Falls     Seat Belt Use: yes      Sexual History:  currently monogamous.        Drug Use:  never.        Blood Transfusions:  no.        Travel History:  no.    Comments: pt. declined condoms   Updated Prior Medication List: AMBIEN 10 MG TABS (ZOLPIDEM TARTRATE) Take 1 tablet by mouth at bedtime TRUVADA 200-300 MG TABS (EMTRICITABINE-TENOFOVIR) Take 1 tablet by mouth once a day ISENTRESS 400 MG TABS (RALTEGRAVIR POTASSIUM) Take 1 tablet by mouth two times a day NORVASC 10 MG TABS (AMLODIPINE BESYLATE) Take 1 tablet by mouth once a day  Current Allergies (reviewed today): ! BACTRIM ! * DAPSONE ! * AZT Past History:  Past Medical History: Last updated: 05/07/2007 HIV disease Hypertension Pneumonia, hx of  Review of Systems  The patient denies anorexia, fever, and weight loss.    Vital Signs:  Patient profile:   60 year old female Menstrual status:  postmenopausal Height:      60 inches (152.40 cm) Weight:      145.12 pounds (65.96 kg) BMI:     28.44 Temp:     97.7 degrees F (36.50 degrees C) oral Pulse rate:   71 / minute BP sitting:   158 / 115  (right arm)  Vitals Entered By: Wendall Mola CMA Duncan Dull) (February 12, 2010 3:13 PM) CC: follow-up visit, lab  results, B/P high, has not taken Norvasc for two days, refill Ambien, requests B12 shot Is Patient Diabetic? No Pain Assessment Patient in pain? no      Nutritional Status BMI of 19 -24 = normal Nutritional Status Detail appetite "good"  Does patient need assistance? Functional Status Self care Ambulation Normal Comments no missed doses of HAART meds per pt.   Physical Exam  General:  alert, well-developed, well-nourished, and well-hydrated.   Head:  normocephalic and atraumatic.   Mouth:  pharynx pink and moist.   Lungs:  normal breath sounds.      Impression & Recommendations:  Problem # 1:  HIV DISEASE (ICD-042) Pt.s most recent CD4ct was 290 and VL<20  .  Pt instructed to continue the current antiretroviral regimen.  Pt encouraged to take medication regularly and not miss doses.  Pt will f/u in 3 months for repeat blood work and will see me 2 weeks later. B12 given.  Diagnostics Reviewed:  HIV: CDC-defined AIDS (12/19/2008)   CD4: 290 (02/07/2010)   WBC: 4.3 (02/06/2010)   Hgb: 12.8 (02/06/2010)   HCT: 39.7 (02/06/2010)   Platelets: 157 (02/06/2010) HIV-1 RNA: <20 copies/mL (02/06/2010)   HBSAg: NEG (  10/29/2007)  Other Orders: Est. Patient Level III (81191) Vit B12 1000 mcg (J3420) Admin of Therapeutic Inj  intramuscular or subcutaneous (47829) Future Orders: T-CD4SP ([facility]) (CD4SP) ... 05/13/2010 T-HIV Viral Load [phone]) ... 05/13/2010 T-Comprehensive Metabolic Panel [phone]) ... 05/13/2010 T-CBC w/Diff ([id number]) ... 05/13/2010  Patient Instructions: 1)  Please schedule a follow-up appointment in 3 months, 2 weeks after labs  Prescriptions: AMBIEN 10 MG TABS (ZOLPIDEM TARTRATE) Take 1 tablet by mouth at bedtime  #30 x 5   Entered and Authorized by:   Yisroel Ramming MD   Signed by:   Yisroel Ramming MD on 02/12/2010   Method used:   Print then Give to Patient   RxID:   [id number]    Not Administered:    Influenza Vaccine not  given due to: declined    Medication Administration  Injection # 1:    Medication: Vit B12 1000 mcg    Diagnosis: FATIGUE (ICD-780.79)    Route: IM    Site: R deltoid    Exp Date: 09/17/2011    Lot #: [id number]    Mfr: [employer]    Patient tolerated injection without complications    Given by: Wendall Mola CMA Duncan Dull) (February 12, 2010 3:34 PM)  Orders Added: 1)  T-CD4SP [facility]) [CD4SP] 2)  T-HIV Viral Load [phone] 3)  T-Comprehensive Metabolic Panel [80053-22900] 4)  T-CBC w/Diff [16606-30160] 5)  Est. Patient Level III [10932] 6)  Vit B12 1000 mcg [J3420] 7)  Admin of Therapeutic Inj  intramuscular or subcutaneous [35573]

## 2010-06-18 NOTE — Miscellaneous (Signed)
Summary: RW - Insurance  Clinical Lists Changes  Observations: Added new observation of PAYOR: Medicaid (03/13/2008 13:28)

## 2010-06-18 NOTE — Assessment & Plan Note (Signed)
Summary: Pap Smear   Vital Signs:  Patient profile:   60 year old female Menstrual status:  postmenopausal  Vitals Entered By: Jennet Maduro RN (December 21, 2009 4:06 PM) CC: PAP smear visit.  Pt. given condoms.  Pt. given educational materials re:  HIV and women, IOs, medications side effects, diet, exercise, nutrition, BSE, Mammograms, and self-esteem.     Menstrual Status postmenopausal Last PAP Result NEGATIVE FOR INTRAEPITHELIAL LESIONS OR MALIGNANCY.   Patient Instructions: 1)  I will be in touch in about a week with your results.  Thank you for coming to the Center for your care.  Whit Bruni   CC:  PAP smear visit.  Pt. given condoms.  Pt. given educational materials re:  HIV and women, IOs, medications side effects, diet, exercise, nutrition, BSE, Mammograms, and and self-esteem..   Evaluation and Follow-Up  Prevention For Positives: 12/21/2009   Safe sex practices discussed with patient. Condoms offered. Prior Medications: AMBIEN 10 MG TABS (ZOLPIDEM TARTRATE) Take 1 tablet by mouth at bedtime TRUVADA 200-300 MG TABS (EMTRICITABINE-TENOFOVIR) Take 1 tablet by mouth once a day ISENTRESS 400 MG TABS (RALTEGRAVIR POTASSIUM) Take 1 tablet by mouth two times a day NORVASC 10 MG TABS (AMLODIPINE BESYLATE) Take 1 tablet by mouth once a day Current Allergies: ! BACTRIM ! * DAPSONE ! * AZT Orders Added: 1)  T-PAP [facility]) [88142] 2)  Est. Patient Level I [59563]             Prevention For Positives: 12/21/2009   Safe sex practices discussed with patient. Condoms offered.

## 2010-06-18 NOTE — Letter (Signed)
Summary: Discharge  instruction 08/04/06  Discharge  instruction 08/04/06   Imported By: Florinda Marker 08/10/2006 11:40:12  _____________________________________________________________________  External Attachment:    Type:   Image     Comment:   External Document

## 2010-06-18 NOTE — Letter (Signed)
Summary: [facility]  [address]   [place], [place] [address]   Phone: [phone]  Fax: [phone]          10/31/2009    RE: [name]   To Whom It May Concern:  Vanessa Beard is a patient of mine. She has had a number of health and stress related issues that have negatively impacted her ability to perform well in school.  She remains very motivated to obtain her degree, which she can only do with financial aid. I ask you to please continue her financial aid so she can reach her goal of graduating.  Sincerely,   Yisroel Ramming MD

## 2010-06-18 NOTE — Assessment & Plan Note (Signed)
Summary: mult. s.e. from new rx/dde   Chief Complaint:  body aches since being on meds and unable to sleep.  History of Present Illness: Pt had a lot of side effects with the Atripla - body aches, memory problems and nausea. She would like to change therapy.  She would also like the higher dose of the ambien because the 5 mg Remus Loffler is not helping her. Pt requests a B12 injection.    Updated Prior Medication List: MEPRON 750 MG/5ML  SUSP (ATOVAQUONE) 5cc by mouth once daily AMBIEN 10 MG TABS (ZOLPIDEM TARTRATE) Take 1 tablet by mouth at bedtime CLARITIN 10 MG  TABS (LORATADINE) Take 1 tablet by mouth once a day LISINOPRIL 10 MG  TABS (LISINOPRIL) Take 1 tablet by mouth once a day ULTRAM 50 MG  TABS (TRAMADOL HCL) Take 1 tablet by mouth every 8 hours prn TRUVADA 200-300 MG TABS (EMTRICITABINE-TENOFOVIR) Take 1 tablet by mouth once a day ISENTRESS 400 MG TABS (RALTEGRAVIR POTASSIUM) Take 1 tablet by mouth two times a day  Current Allergies (reviewed today): ! BACTRIM ! * DAPSONE ! 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     Vital Signs:  Patient Profile:   60 Years Old Female Height:     60 inches (152.40 cm) Weight:      139.06 pounds (63.21 kg) BMI:     27.26 Temp:     96.4 degrees F (35.78 degrees C) oral Pulse rate:   74 / minute BP sitting:   143 / 92  (left arm)  Pt. in pain?   no  Vitals Entered By:  Starleen Arms CMA (April 07, 2008 10:49 AM)              Is Patient Diabetic? No Nutritional Status BMI of 25 - 29 = overweight Nutritional Status Detail nl  Have you ever been in a relationship where you felt threatened, hurt or afraid?No   Does patient need assistance? Functional Status Self care Ambulation Normal     Physical Exam  General:     alert, well-developed, well-nourished, and well-hydrated.   Head:     normocephalic and atraumatic.   Mouth:     pharynx pink and moist.  no thrush  Lungs:     normal breath sounds.      Impression & Recommendations:  Problem # 1:  HIV DISEASE (ICD-042) Will change her to [place] and [place] - discussed need to take her meds every day.  Potential side effects were discussed.  B12 IM given today. The following medications were removed from the medication list:    Atripla 600-200-300 Mg Tabs (Efavirenz-emtricitab-tenofovir) .Marland Kitchen... Take 1 tablet by mouth at bedtime  Her  updated medication list for this problem includes:    Truvada 200-300 Mg Tabs (Emtricitabine-tenofovir) .Marland Kitchen... Take 1 tablet by mouth once a day    Isentress 400 Mg Tabs (Raltegravir potassium) .Marland Kitchen... Take 1 tablet by mouth two times a day   Medications Added to Medication List This Visit: 1)  Ambien 10 Mg Tabs (Zolpidem tartrate) .... Take 1 tablet by mouth at bedtime 2)  Truvada 200-300 Mg Tabs (Emtricitabine-tenofovir) .... Take 1 tablet by mouth once a day 3)  Isentress 400 Mg Tabs (Raltegravir potassium) .... Take 1 tablet by mouth two times a day  Other Orders: Est. Patient Level III ([address]) T-Wet Prep by [facility] [phone]) Vit B12 1000 mcg (B1478)  Future Orders: T-CD4 (29562-13086) ... 05/19/2008 T-HIV Viral Load [phone]) ... 05/19/2008 T-Comprehensive Metabolic Panel [phone]) ... 05/19/2008 T-CBC w/Diff ([id number]) ... 05/19/2008 T-RPR (Syphilis) [phone]) ... 05/19/2008 T-Lipid Profile [id number]) ...  05/19/2008     Patient Instructions: 1)  Please schedule a follow-up appointment in 8 weeks.   Prescriptions: ISENTRESS 400 MG TABS (RALTEGRAVIR POTASSIUM) Take 1 tablet by mouth two times a day  #60 x 5   Entered and Authorized by:   Yisroel Ramming MD   Signed by:   Yisroel Ramming MD on 04/07/2008   Method used:   Print then Give to Patient   RxID:   [id number] TRUVADA 200-300 MG TABS (EMTRICITABINE-TENOFOVIR) Take 1 tablet by mouth once a day  #30 x 5   Entered and Authorized by:   Yisroel Ramming MD   Signed by:   Yisroel Ramming MD on 04/07/2008   Method used:   Print then Give to Patient   RxID:   [id number] AMBIEN 10 MG TABS (ZOLPIDEM TARTRATE) Take 1 tablet by mouth at bedtime  #30 x 5   Entered and Authorized by:   Yisroel Ramming MD   Signed by:   Yisroel Ramming MD on 04/07/2008   Method used:   Print then Give to Patient   RxID:   [id number]  ]  Medication Administration  Injection # 1:    Medication: Vit B12 1000 mcg    Diagnosis: FATIGUE (ICD-780.79)    Route: IM    Site: L deltoid    Exp Date: 01/2010    Lot #: [id number]    Mfr: [employer]    Patient tolerated injection without complications    Given by: Starleen Arms CMA (April 07, 2008 11:27 AM)  Orders Added: 1)  T-CD4 [phone] 2)  T-HIV Viral Load [46270-35009] 3)  T-Comprehensive Metabolic Panel [80053-22900] 4)  T-CBC w/Diff [38182-99371] 5)  T-RPR (Syphilis) [69678-93810] 6)  T-Lipid Profile [80061-22930] 7)  Est. Patient Level III [17510] 8)  T-Wet Prep by Molecular Probe [[id number]] 9)  Vit B12 1000 mcg [J3420]

## 2010-06-18 NOTE — Progress Notes (Signed)
Summary: pt.needs to reschedule 11/16/09 PAP appt.   Phone Note Outgoing Call   Call placed by: Jennet Maduro RN,  November 09, 2009 12:09 PM Call placed to: Patient Summary of Call: Message left for pt. to call the office to reschedule her PAP smear appt. for Fri., JUly 1st. Jennet Maduro RN  November 09, 2009 12:10 PM

## 2010-06-18 NOTE — Assessment & Plan Note (Signed)
Summary: 36month f/u/vs   CC:  follow-up visit, lab results, and pt. feels she has been bruising easily.  History of Present Illness: Pt has been under a lot of stress. she has been taking her HIV meds every day.  She wants to finish school but is at risk for losing his financial aid.  Preventive Screening-Counseling & Management  Alcohol-Tobacco     Alcohol drinks/day: 0     Alcohol type: wine, occass     Smoking Status: never     Year Quit: 40 years ago, occassionally     Passive Smoke Exposure: yes  Caffeine-Diet-Exercise     Caffeine use/day: occasional     Does Patient Exercise: yes     Type of exercise: walking at work, CNA     Times/week: 5  Hep-HIV-STD-Contraception     HIV Risk: no  Safety-Violence-Falls     Seat Belt Use: yes      Sexual History:  currently monogamous.        Drug Use:  never.        Blood Transfusions:  no.        Travel History:  no.    Comments: pt. declined condoms   Updated Prior Medication List: AMBIEN 10 MG TABS (ZOLPIDEM TARTRATE) Take 1 tablet by mouth at bedtime TRUVADA 200-300 MG TABS (EMTRICITABINE-TENOFOVIR) Take 1 tablet by mouth once a day ISENTRESS 400 MG TABS (RALTEGRAVIR POTASSIUM) Take 1 tablet by mouth two times a day NORVASC 10 MG TABS (AMLODIPINE BESYLATE) Take 1 tablet by mouth once a day  Current Allergies (reviewed today): ! BACTRIM ! * DAPSONE ! * AZT Past History:  Past Medical History: Last updated: 05/07/2007 HIV disease Hypertension Pneumonia, hx of  Review of Systems  The patient denies anorexia, fever, and weight loss.    Vital Signs:  Patient profile:   60 year old female Menstrual status:  postmenopausal Height:      60 inches (152.40 cm) Weight:      141.0 pounds (64.09 kg) BMI:     27.64 Temp:     98.3 degrees F (36.83 degrees C) oral Pulse rate:   66 / minute BP sitting:   128 / 83  (left arm)  Vitals Entered By: Wendall Mola CMA Duncan Dull) (October 31, 2009 2:14 PM) CC:  follow-up visit, lab results, pt. feels she has been bruising easily Is Patient Diabetic? No Pain Assessment Patient in pain? no      Nutritional Status BMI of 25 - 29 = overweight Nutritional Status Detail appetite "great"  Does patient need assistance? Functional Status Self care Ambulation Normal Comments no missed doses of meds per patient   Physical Exam  General:  alert, well-developed, well-nourished, and well-hydrated.   Head:  normocephalic and atraumatic.   Lungs:  normal breath sounds.      Impression & Recommendations:  Problem # 1:  HIV DISEASE (ICD-042) Pt.s most recent CD4ct was 270 and VL <48 .  Pt instructed to continue the current antiretroviral regimen.  Pt encouraged to take medication regularly and not miss doses.  Pt will f/u in 3 months for repeat blood work and will see me 2 weeks later.  Diagnostics Reviewed:  HIV: CDC-defined AIDS (12/19/2008)   CD4: 270 (10/18/2009)   WBC: 3.1 (10/17/2009)   Hgb: 11.6 (10/17/2009)   HCT: 36.1 (10/17/2009)   Platelets: 159 (10/17/2009) HIV-1 RNA: <48 copies/mL (10/17/2009)   HBSAg: NEG (10/29/2007)  Other Orders: Est. Patient Level III ([address]) Future Orders: T-CD4SP (  [facility]) (CD4SP) ... 01/29/2010 T-HIV Viral Load [phone]) ... 01/29/2010 T-Comprehensive Metabolic Panel [phone]) ... 01/29/2010 T-CBC w/Diff ([id number]) ... 01/29/2010  Patient Instructions: 1)  Please schedule a follow-up appointment in 3 months, 2 weeks after labs. 2)  schedule PAP

## 2010-06-18 NOTE — Assessment & Plan Note (Signed)
Summary: F/U/VS   CC:  f/u labs .  History of Present Illness: Pt here for f/u.  She has been taking hr HIV meds regularly.  She was treated with antibiotics for URI in May.   Preventive Screening-Counseling & Management  Alcohol-Tobacco     Alcohol drinks/day: 0     Smoking Status: never      Sexual History:  currently monogamous.        Drug Use:  never.        Blood Transfusions:  no.        Travel History:  no.     Updated Prior Medication List: MEPRON 750 MG/5ML  SUSP (ATOVAQUONE) 5cc by mouth once daily AMBIEN 10 MG TABS (ZOLPIDEM TARTRATE) Take 1 tablet by mouth at bedtime CLARITIN 10 MG  TABS (LORATADINE) Take 1 tablet by mouth once a day ULTRAM 50 MG  TABS (TRAMADOL HCL) Take 1 tablet by mouth every 8 hours prn TRUVADA 200-300 MG TABS (EMTRICITABINE-TENOFOVIR) Take 1 tablet by mouth once a day ISENTRESS 400 MG TABS (RALTEGRAVIR POTASSIUM) Take 1 tablet by mouth two times a day NORVASC 10 MG TABS (AMLODIPINE BESYLATE) Take 1 tablet by mouth once a day VICODIN 5-500 MG TABS (HYDROCODONE-ACETAMINOPHEN) Take 1 tablet by mouth every 8 hours as needed  Current Allergies (reviewed today): ! BACTRIM ! * DAPSONE ! * AZT Past History:  Past Medical History: Last updated: 05/07/2007 HIV disease Hypertension Pneumonia, hx of  Review of Systems  The patient denies anorexia, fever, and weight loss.    Vital Signs:  Patient profile:   60 year old female Menstrual status:  postmenopausal Height:      60 inches (152.40 cm) Weight:      138 pounds (62.73 kg) BMI:     27.05 Temp:     97.9 degrees F (36.61 degrees C) oral Pulse rate:   65 / minute BP sitting:   149 / 90  (left arm)  Vitals Entered By: Starleen Arms CMA (December 19, 2008 3:21 PM) CC: f/u labs  Is Patient Diabetic? No Nutritional Status BMI of 25 - 29 = overweight Nutritional Status Detail nl  Have you ever been in a relationship where you felt threatened, hurt or afraid?No   Does  patient need assistance? Functional Status Self care Ambulation Normal   Physical Exam  General:  alert, well-developed, well-nourished, and well-hydrated.   Head:  normocephalic and atraumatic.   Mouth:  pharynx pink and moist.  no thrush  Lungs:  normal breath sounds.          Medication Adherence: 12/19/2008   Adherence to medications reviewed with patient. Counseling to provide adequate adherence provided           12/19/2008   Patient was screened for substance abuse and depression. Referal was made as indicated.                      Impression & Recommendations:  Problem # 1:  HIV DISEASE (ICD-042) Pt.s most recent CD4ct was 180 and VL <48 .  Pt instructed to continue the current antiretroviral regimen.  Pt encouraged to take medication regularly and not miss doses.  Pt will f/u in 3 months for repeat blood work and will see me 2 weeks later.  Diagnostics Reviewed:  HIV: HIV positive - not AIDS (09/15/2008)   CD4: 180 (12/06/2008)   WBC: 2.3 (12/05/2008)   Hgb: 11.7 (12/05/2008)   HCT: 37.2 (12/05/2008)   Platelets:  140 (12/05/2008) HIV-1 RNA: <48 copies/mL (12/05/2008)   HBSAg: NEG (10/29/2007)  Problem # 2:  PREVENTIVE HEALTH CARE (ICD-V70.0)  Orders: Mammogram (Screening) (Mammo)  Other Orders: Est. Patient Level III (28413) Future Orders: T-CD4SP ([facility]) (CD4SP) ... 03/19/2009 T-HIV Viral Load [phone]) ... 03/19/2009 T-Comprehensive Metabolic Panel [phone]) ... 03/19/2009 T-CBC w/Diff ([id number]) ... 03/19/2009 T-Lipid Profile [phone]) ... 03/19/2009  Patient Instructions: 1)  Please schedule a follow-up appointment in 3 months, 2 weeks after labs.

## 2010-06-18 NOTE — Progress Notes (Signed)
Summary: request for Ambien refill  Phone Note From Pharmacy   Caller: [facility] [address] Reason for Call: Needs renewal Summary of Call: request for ambien refill. patient has an appointment tomorrow 02/12/10 Initial call taken by: Paulo Fruit  BS,CPht II,MPH,  February 11, 2010 10:23 AM  Follow-up for Phone Call        can address tomorrow Follow-up by: Yisroel Ramming MD,  February 11, 2010 2:17 PM     Appended Document: request for Ambien refill will let pharmacy know

## 2010-06-18 NOTE — Progress Notes (Signed)
Summary: Pt. has question re: B/P rx.  To call back  Phone Note Call from Patient Call back at [facility] Phone [phone]   Caller: Patient Reason for Call: Talk to Nurse Summary of Call: Pt. left message to call her re:  B/P rx.  RN returned called.  Message left for pt. to call back first thing Monday morning. Jennet Maduro RN  March 01, 2010 4:33 PM

## 2010-06-18 NOTE — Letter (Signed)
Summary: Results Follow-up Letter  [facility]  [address]   [place], [place] [address]   Phone: [phone]  Fax: [phone]        January 08, 2010  [address] [place], [place]  [address]  Dear Vanessa Beard,   The following are the results of your recent test(s):  Test     Result     Pap Smear    Normal_______  Not Normal_XXX_       Comments:  Your PAP smear results came back abnormal.  Dr. Philipp Deputy has referred you to the GYN Clinic at the [facility] [place].  Your appointment is:   [facility] [place] [place] [facility]   [address]   [place], [place], [address]   Tel. # [phone]     Date:  Wednesday, Sept. 21, 2011   Time:  1:45 PM  Please arrive 15 minutes early to complete paperwork.  Bring your insurance card or hospital discount card.  If you need to change this appointment please call the GYN Clinic at [phone] to reschedule at least 24 hours prior to the appointment.    Thank you for coming to the Center for your care.  Sincerely,    Jennet Maduro [facility]

## 2010-06-18 NOTE — Miscellaneous (Signed)
Summary: clinical update/ryan Kutscher  Clinical Lists Changes  Observations: Added new observation of CASE MGM: Mitchel Honour (05/07/2007 11:32) Added new observation of INFECTDIS MD: Philipp Deputy MD (05/07/2007 11:32) Added new observation of RWTITLE: D (05/07/2007 11:32) Added new observation of AIDSDAP: Pending (05/07/2007 11:32) Added new observation of PAYOR: No Insurance (05/07/2007 11:32) Added new observation of PCTFPL: 150  (05/07/2007 11:32) Added new observation of INCOMESOURCE: Wages  (05/07/2007 11:32) Added new observation of HOUSEINCOME: [address]  (05/07/2007 11:32) Added new observation of MARITAL STAT: Separated  (05/07/2007 11:32) Added new observation of #CHILD<18 IN: 0  (05/07/2007 11:32) Added new observation of FAMILYSIZE: 1  (05/07/2007 11:32) Added new observation of HOUSING: Permanent  (05/07/2007 11:32) Added new observation of FINASSESSDT: 05/07/2007  (05/07/2007 11:32) Added new observation of YEARLYEXPEN: 700  (05/07/2007 11:32) Added new observation of RWPARTICIP: Yes  (05/07/2007 11:32) Added new observation of GENDER: Female  (05/07/2007 11:32) Added new observation of RACE: African American  (05/07/2007 11:32) Added new observation of PATNTCOUNTY: [place]  (05/07/2007 11:32) Added new observation of REC_MESSAGE: Yes  (05/07/2007 11:32) Added new observation of RECPHONECALL: Yes  (05/07/2007 11:32) Added new observation of REC_MAIL: Yes  (05/07/2007 11:32)

## 2010-06-20 NOTE — Miscellaneous (Addendum)
  Clinical Lists Changes  Orders: Added new Test order of T-CBC w/Diff [phone]) - Signed Added new Test order of T-CD4SP [facility]) (CD4SP) - Signed Added new Test order of T-Comprehensive Metabolic Panel [phone]) - Signed Added new Test order of T-HIV Viral Load [phone]) - Signed     Process Orders Check Orders Results:     [facility]: Check successful Tests Sent for requisitioning (June 11, 2010 12:22 PM):     06/11/2010: [facility] -- T-CBC w/Diff [[id number]] (signed)     06/11/2010: [facility] -- T-Comprehensive Metabolic Panel [[id number]] (signed)     06/11/2010: [facility] -- T-HIV Viral Load [phone] (signed)

## 2010-06-25 ENCOUNTER — Ambulatory Visit (INDEPENDENT_AMBULATORY_CARE_PROVIDER_SITE_OTHER): Payer: MEDICARE | Admitting: Adult Health

## 2010-06-25 DIAGNOSIS — M545 Low back pain, unspecified: Secondary | ICD-10-CM

## 2010-06-25 DIAGNOSIS — B20 Human immunodeficiency virus [HIV] disease: Secondary | ICD-10-CM

## 2010-06-25 DIAGNOSIS — M546 Pain in thoracic spine: Secondary | ICD-10-CM

## 2010-06-27 ENCOUNTER — Ambulatory Visit
Admission: RE | Admit: 2010-06-27 | Discharge: 2010-06-27 | Disposition: A | Discharge status: Home / Self Care | Payer: MEDICARE | Source: Ambulatory Visit | Attending: Infectious Diseases | Admitting: Infectious Diseases

## 2010-06-27 ENCOUNTER — Other Ambulatory Visit: Payer: Self-pay | Admitting: Infectious Diseases

## 2010-06-27 ENCOUNTER — Encounter: Payer: Self-pay | Admitting: Adult Health

## 2010-06-27 DIAGNOSIS — M549 Dorsalgia, unspecified: Secondary | ICD-10-CM

## 2010-07-31 LAB — POCT PREGNANCY, URINE: Preg Test, Ur: NEGATIVE

## 2010-08-01 LAB — T-HELPER CELL (CD4) - (RCID CLINIC ONLY)
CD4 % Helper T Cell: 25 % — ABNORMAL LOW (ref 33–55)
CD4 T Cell Abs: 290 uL — ABNORMAL LOW (ref 400–2700)

## 2010-08-05 LAB — T-HELPER CELL (CD4) - (RCID CLINIC ONLY)
CD4 % Helper T Cell: 26 % — ABNORMAL LOW (ref 33–55)
CD4 T Cell Abs: 270 uL — ABNORMAL LOW (ref 400–2700)

## 2010-08-06 ENCOUNTER — Telehealth: Payer: Self-pay | Admitting: *Deleted

## 2010-08-06 DIAGNOSIS — S32030A Wedge compression fracture of third lumbar vertebra, initial encounter for closed fracture: Secondary | ICD-10-CM

## 2010-08-06 NOTE — Telephone Encounter (Signed)
States she is out of her narc pain med & needs a refill . Is taking plain ibu with little relief. Uses [facility] [address]. Wants to be xcalled when this is done [phone].Faustino Congress

## 2010-08-07 LAB — T-HELPER CELL (CD4) - (RCID CLINIC ONLY)
CD4 % Helper T Cell: 26 % — ABNORMAL LOW (ref 33–55)
CD4 T Cell Abs: 220 uL — ABNORMAL LOW (ref 400–2700)

## 2010-08-07 MED ORDER — HYDROCODONE-IBUPROFEN 7.5-200 MG PO TABS: 1.0000 | ORAL_TABLET | Freq: Four times a day (QID) | ORAL | Status: DC | PRN

## 2010-08-08 NOTE — Telephone Encounter (Signed)
Stated she is still trying to get the med. [employer] RX plan had her update all info. States it should go thru now.Faustino Congress

## 2010-08-09 ENCOUNTER — Telehealth: Payer: Self-pay | Admitting: *Deleted
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
## 2010-08-13 ENCOUNTER — Ambulatory Visit: Payer: MEDICARE | Admitting: Adult Health

## 2010-08-14 ENCOUNTER — Telehealth: Payer: Self-pay | Admitting: *Deleted

## 2010-08-14 NOTE — Telephone Encounter (Signed)
Called patient with her appt time but was unable to get her so I had to leave her a message. The patient will call back.

## 2010-08-16 ENCOUNTER — Ambulatory Visit: Payer: MEDICARE | Admitting: Adult Health

## 2010-08-21 ENCOUNTER — Other Ambulatory Visit: Payer: Self-pay | Admitting: *Deleted

## 2010-08-21 DIAGNOSIS — G479 Sleep disorder, unspecified: Secondary | ICD-10-CM

## 2010-08-21 LAB — T-HELPER CELL (CD4) - (RCID CLINIC ONLY)
CD4 % Helper T Cell: 27 % — ABNORMAL LOW (ref 33–55)
CD4 T Cell Abs: 160 uL — ABNORMAL LOW (ref 400–2700)

## 2010-08-21 MED ORDER — ZOLPIDEM TARTRATE 10 MG PO TABS: 10.0000 mg | ORAL_TABLET | Freq: Every evening | ORAL | Status: DC | PRN

## 2010-08-25 LAB — T-HELPER CELL (CD4) - (RCID CLINIC ONLY)
CD4 % Helper T Cell: 24 % — ABNORMAL LOW (ref 33–55)
CD4 T Cell Abs: 180 uL — ABNORMAL LOW (ref 400–2700)

## 2010-08-27 LAB — BASIC METABOLIC PANEL
BUN: 11 mg/dL (ref 6–23)
CO2: 25 mEq/L (ref 19–32)
Calcium: 7.8 mg/dL — ABNORMAL LOW (ref 8.4–10.5)
Chloride: 103 mEq/L (ref 96–112)
Creatinine, Ser: 0.69 mg/dL (ref 0.4–1.2)
GFR calc Af Amer: >60 mL/min (ref >60)
GFR calc non Af Amer: >60 mL/min (ref >60)
Glucose, Bld: 107 mg/dL — ABNORMAL HIGH (ref 70–99)
Potassium: 3.6 mEq/L (ref 3.5–5.1)
Sodium: 129 mEq/L — ABNORMAL LOW (ref 135–145)

## 2010-08-27 LAB — CBC
HCT: 30.2 % — ABNORMAL LOW (ref 36.0–46.0)
Hemoglobin: 10.2 g/dL — ABNORMAL LOW (ref 12.0–15.0)
MCHC: 33.9 g/dL (ref 30.0–36.0)
MCV: 85.7 fL (ref 78.0–100.0)
Platelets: 119 10*3/uL — ABNORMAL LOW (ref 150–400)
RBC: 3.52 MIL/uL — ABNORMAL LOW (ref 3.87–5.11)
RDW: 14.3 % (ref 11.5–15.5)
WBC: 5.7 10*3/uL (ref 4.0–10.5)

## 2010-08-27 LAB — D-DIMER, QUANTITATIVE: D-Dimer, Quant: 0.74 ug/mL-FEU — ABNORMAL HIGH (ref 0.00–0.48)

## 2010-08-27 LAB — DIFFERENTIAL
Basophils Absolute: 0 10*3/uL (ref 0.0–0.1)
Basophils Relative: 0 % (ref 0–1)
Eosinophils Absolute: 0 10*3/uL (ref 0.0–0.7)
Eosinophils Relative: 0 % (ref 0–5)
Lymphocytes Relative: 23 % (ref 12–46)
Lymphs Abs: 1.3 10*3/uL (ref 0.7–4.0)
Monocytes Absolute: 0.3 10*3/uL (ref 0.1–1.0)
Monocytes Relative: 5 % (ref 3–12)
Neutro Abs: 4.1 10*3/uL (ref 1.7–7.7)
Neutrophils Relative %: 73 % (ref 43–77)

## 2010-08-28 LAB — T-HELPER CELL (CD4) - (RCID CLINIC ONLY)
CD4 % Helper T Cell: 13 % — ABNORMAL LOW (ref 33–55)
CD4 T Cell Abs: 130 uL — ABNORMAL LOW (ref 400–2700)

## 2010-09-12 ENCOUNTER — Ambulatory Visit: Payer: MEDICARE

## 2010-09-25 ENCOUNTER — Telehealth: Payer: Self-pay | Admitting: *Deleted

## 2010-09-25 NOTE — Telephone Encounter (Signed)
Patient went to orthopedic and was given tramadol which she said does not help and methocarbanol which she can not afford.  Is requesting Vicodin Wendall Mola CMA

## 2010-09-26 NOTE — Telephone Encounter (Signed)
As orthopedics has made the current treatment for her pain management, she needs to contact their office to discuss changing the pain medication.  As I do not have their most recent notes, further intervention needs to come from their office.

## 2010-09-27 NOTE — Telephone Encounter (Signed)
Pt. Notified Wendall Mola CMA
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
## 2010-11-13 ENCOUNTER — Other Ambulatory Visit: Payer: Self-pay | Admitting: Orthopedic Surgery

## 2010-11-13 ENCOUNTER — Other Ambulatory Visit (HOSPITAL_COMMUNITY): Payer: Self-pay | Admitting: Orthopedic Surgery

## 2010-11-13 DIAGNOSIS — M810 Age-related osteoporosis without current pathological fracture: Secondary | ICD-10-CM

## 2010-11-13 DIAGNOSIS — S32030A Wedge compression fracture of third lumbar vertebra, initial encounter for closed fracture: Secondary | ICD-10-CM

## 2010-11-16 ENCOUNTER — Other Ambulatory Visit: Payer: Medicare Other

## 2010-11-18 ENCOUNTER — Ambulatory Visit (HOSPITAL_COMMUNITY)
Admission: RE | Admit: 2010-11-18 | Discharge: 2010-11-18 | Disposition: A | Discharge status: Home / Self Care | Payer: Medicare Other | Source: Ambulatory Visit | Attending: Orthopedic Surgery | Admitting: Orthopedic Surgery

## 2010-11-18 DIAGNOSIS — Z780 Asymptomatic menopausal state: Secondary | ICD-10-CM | POA: Insufficient documentation

## 2010-11-18 DIAGNOSIS — Z13820 Encounter for screening for osteoporosis: Secondary | ICD-10-CM | POA: Insufficient documentation

## 2010-11-18 DIAGNOSIS — M810 Age-related osteoporosis without current pathological fracture: Secondary | ICD-10-CM

## 2010-11-25 ENCOUNTER — Ambulatory Visit
Admission: RE | Admit: 2010-11-25 | Discharge: 2010-11-25 | Disposition: A | Discharge status: Home / Self Care | Payer: Medicare Other | Source: Ambulatory Visit | Attending: Orthopedic Surgery | Admitting: Orthopedic Surgery

## 2010-11-25 DIAGNOSIS — S32030A Wedge compression fracture of third lumbar vertebra, initial encounter for closed fracture: Secondary | ICD-10-CM

## 2010-12-31 ENCOUNTER — Telehealth: Payer: Self-pay | Admitting: *Deleted

## 2010-12-31 DIAGNOSIS — Z113 Encounter for screening for infections with a predominantly sexual mode of transmission: Secondary | ICD-10-CM

## 2010-12-31 DIAGNOSIS — Z79899 Other long term (current) drug therapy: Secondary | ICD-10-CM

## 2010-12-31 DIAGNOSIS — B20 Human immunodeficiency virus [HIV] disease: Secondary | ICD-10-CM

## 2010-12-31 NOTE — Telephone Encounter (Signed)
appts made for the pt.  Lab orders entered.  Jennet Maduro, RN

## 2011-01-01 ENCOUNTER — Other Ambulatory Visit: Payer: Self-pay | Admitting: Infectious Diseases

## 2011-01-01 ENCOUNTER — Other Ambulatory Visit: Payer: Medicare Other

## 2011-01-01 DIAGNOSIS — Z79899 Other long term (current) drug therapy: Secondary | ICD-10-CM

## 2011-01-01 DIAGNOSIS — B20 Human immunodeficiency virus [HIV] disease: Secondary | ICD-10-CM

## 2011-01-01 DIAGNOSIS — Z113 Encounter for screening for infections with a predominantly sexual mode of transmission: Secondary | ICD-10-CM

## 2011-01-02 LAB — CBC WITH DIFFERENTIAL/PLATELET
Basophils Absolute: 0 10*3/uL (ref 0.0–0.1)
Basophils Relative: 0 % (ref 0–1)
Eosinophils Absolute: 0 10*3/uL (ref 0.0–0.7)
Eosinophils Relative: 1 % (ref 0–5)
HCT: 36.7 % (ref 36.0–46.0)
Hemoglobin: 11.9 g/dL — ABNORMAL LOW (ref 12.0–15.0)
Lymphocytes Relative: 28 % (ref 12–46)
Lymphs Abs: 1.3 10*3/uL (ref 0.7–4.0)
MCH: 29.2 pg (ref 26.0–34.0)
MCHC: 32.4 g/dL (ref 30.0–36.0)
MCV: 90 fL (ref 78.0–100.0)
Monocytes Absolute: 0.2 10*3/uL (ref 0.1–1.0)
Monocytes Relative: 4 % (ref 3–12)
Neutro Abs: 3.2 10*3/uL (ref 1.7–7.7)
Neutrophils Relative %: 67 % (ref 43–77)
Platelets: 156 10*3/uL (ref 150–400)
RBC: 4.08 MIL/uL (ref 3.87–5.11)
RDW: 14 % (ref 11.5–15.5)
WBC: 4.8 10*3/uL (ref 4.0–10.5)

## 2011-01-02 LAB — LIPID PANEL
Cholesterol: 146 mg/dL (ref 0–200)
HDL: 56 mg/dL (ref >39)
LDL Cholesterol: 77 mg/dL (ref 0–99)
Total CHOL/HDL Ratio: 2.6 Ratio
Triglycerides: 67 mg/dL (ref <150)
VLDL: 13 mg/dL (ref 0–40)

## 2011-01-02 LAB — COMPREHENSIVE METABOLIC PANEL
ALT: 9 U/L (ref 0–35)
AST: 25 U/L (ref 0–37)
Albumin: 4 g/dL (ref 3.5–5.2)
Alkaline Phosphatase: 97 U/L (ref 39–117)
BUN: 18 mg/dL (ref 6–23)
CO2: 24 mEq/L (ref 19–32)
Calcium: 8.8 mg/dL (ref 8.4–10.5)
Chloride: 106 mEq/L (ref 96–112)
Creat: 0.89 mg/dL (ref 0.50–1.10)
Glucose, Bld: 91 mg/dL (ref 70–99)
Potassium: 3.9 mEq/L (ref 3.5–5.3)
Sodium: 138 mEq/L (ref 135–145)
Total Bilirubin: 0.8 mg/dL (ref 0.3–1.2)
Total Protein: 8.9 g/dL — ABNORMAL HIGH (ref 6.0–8.3)

## 2011-01-02 LAB — RPR

## 2011-01-02 LAB — T-HELPER CELL (CD4) - (RCID CLINIC ONLY)
CD4 % Helper T Cell: 29 % — ABNORMAL LOW (ref 33–55)
CD4 T Cell Abs: 400 uL (ref 400–2700)

## 2011-01-06 LAB — HIV-1 RNA QUANT-NO REFLEX-BLD
HIV 1 RNA Quant: <20 copies/mL (ref <20)
HIV-1 RNA Quant, Log: <1.3 {Log} (ref <1.30)

## 2011-01-15 ENCOUNTER — Encounter: Payer: Self-pay | Admitting: Adult Health

## 2011-01-15 ENCOUNTER — Ambulatory Visit (INDEPENDENT_AMBULATORY_CARE_PROVIDER_SITE_OTHER): Payer: Medicare Other | Admitting: Adult Health

## 2011-01-15 DIAGNOSIS — S32030A Wedge compression fracture of third lumbar vertebra, initial encounter for closed fracture: Secondary | ICD-10-CM

## 2011-01-15 DIAGNOSIS — S32009A Unspecified fracture of unspecified lumbar vertebra, initial encounter for closed fracture: Secondary | ICD-10-CM

## 2011-01-15 DIAGNOSIS — D649 Anemia, unspecified: Secondary | ICD-10-CM

## 2011-01-15 DIAGNOSIS — E079 Disorder of thyroid, unspecified: Secondary | ICD-10-CM

## 2011-01-15 DIAGNOSIS — B20 Human immunodeficiency virus [HIV] disease: Secondary | ICD-10-CM

## 2011-01-15 LAB — T4: T4, Total: 6.8 ug/dL (ref 5.0–12.5)

## 2011-01-15 LAB — T3 UPTAKE: T3 Uptake: 33.3 % (ref 22.5–37.0)

## 2011-01-15 LAB — T4, FREE: Free T4: 0.82 ng/dL (ref 0.80–1.80)

## 2011-01-15 LAB — T3: T3, Total: 94.4 ng/dL (ref 80.0–204.0)

## 2011-01-15 LAB — T3, FREE: T3, Free: 2.8 pg/mL (ref 2.3–4.2)

## 2011-01-15 LAB — TSH: TSH: 1.353 u[IU]/mL (ref 0.350–4.500)

## 2011-01-15 MED ORDER — CYANOCOBALAMIN 1000 MCG/ML IJ SOLN
1000.0000 ug | Freq: Once | INTRAMUSCULAR | Status: AC
  Administered 2011-01-15: 1000 ug via INTRAMUSCULAR

## 2011-01-15 MED ORDER — HYDROCODONE-IBUPROFEN 7.5-200 MG PO TABS: 1.0000 | ORAL_TABLET | Freq: Four times a day (QID) | ORAL | Status: DC | PRN

## 2011-01-30 ENCOUNTER — Ambulatory Visit: Payer: Medicare Other | Admitting: Adult Health

## 2011-01-31 ENCOUNTER — Ambulatory Visit: Payer: Medicare Other | Admitting: Adult Health

## 2011-02-03 ENCOUNTER — Encounter: Payer: Self-pay | Admitting: Adult Health

## 2011-02-03 ENCOUNTER — Ambulatory Visit (INDEPENDENT_AMBULATORY_CARE_PROVIDER_SITE_OTHER): Payer: Medicare Other | Admitting: Adult Health

## 2011-02-03 DIAGNOSIS — M8580 Other specified disorders of bone density and structure, unspecified site: Secondary | ICD-10-CM

## 2011-02-03 DIAGNOSIS — B20 Human immunodeficiency virus [HIV] disease: Secondary | ICD-10-CM

## 2011-02-03 DIAGNOSIS — E213 Hyperparathyroidism, unspecified: Secondary | ICD-10-CM

## 2011-02-03 DIAGNOSIS — Z23 Encounter for immunization: Secondary | ICD-10-CM

## 2011-02-03 DIAGNOSIS — M899 Disorder of bone, unspecified: Secondary | ICD-10-CM

## 2011-02-03 NOTE — Patient Instructions (Signed)
Osteoporosis and Fractures Osteoporosis is a disease of the bones that makes them weaker and prone to break (fracture). By their mid-30s, most people begin to gradually lose bone strength. If this is severe enough, osteoporosis may occur. Bone fractures from osteoporosis (especially hip and spine fractures) are a major cause of hospitalization, loss of independence, and can lead to life-threatening complications. Your goals are to prevent falls and to prevent or treat weakening bones. ROLE OF FALLS Fractures occur in only a small percentage of older persons who fall, even though at least 30% to 40% of elderly people have osteoporosis. However, a fall of just a few feet may cause a fracture in a person with low bone density. FALL PREVENTION  If you are unsteady on your feet, use a cane, walker, or walk with someone's help.   Remove loose rugs or electrical cords from your home.   Keep your home well lit at night. Use glasses if you need them.   Avoid icy streets and wet or waxed floors.   Hold the railing when using stairs.   Watch out for your pets.   Install grab bars in your bathroom.   Exercise. Physical activity, especially weight-bearing exercise, helps strengthen bones. Strength and balance exercise, such as tai chi, helps prevent falls.   Alcohol and some medicines can make you more likely to fall. Discuss alcohol use with your caregiver. Ask your caregiver if any of your medicines might increase your risk for falling. Ask if safer alternatives are available.  HOME CARE INSTRUCTIONS  Try to prevent and avoid falls.   To pick up objects, bend at the knees. Do not bend with your back.   Do not smoke. If you smoke, ask for help to stop.   Have adequate calcium and vitamin D in your diet. Talk with your caregiver about amounts.   Before exercising, ask your caregiver what exercises will be good for you.   Only take over-the-counter or prescription medicines for pain,  discomfort, or fever as directed by your caregiver.   Some medical conditions (menopause, kidney disease, steroid treatment) can weaken your bones. Talk about bone health with your caregiver. Discuss any medicines you take and whether they can weaken your bones.   If you have osteoporosis or osteopenia, discuss treatment options with your caregiver.  SEEK MEDICAL CARE IF:  You have had a fracture and your pain is not controlled.   You have had a fracture and you are not able to return to activities as expected.   You are reinjured.   You develop side effects from medicines, especially stomach pain or trouble swallowing.   You develop new, unexplained problems.  SEEK IMMEDIATE MEDICAL CARE IF:  You develop sudden, severe pain in your back.   You develop pain after an injury or fall.  Document Released: 03/28/2004 Document Re-Released: 10/23/2009 [facility] Patient Information 2011 [place], [place].

## 2011-02-03 NOTE — Progress Notes (Signed)
Vanessa Beard returns to clinic today for followup evaluation of her antiretroviral regimen, as a result of a bone scan is consistent with osteopenia/osteoporosis. She is currently on raltegravir and Truvada. She has been on tenofovir-based regimen for "a long time," and she was informed by her scoliosis specialist that the tenofovir may contribute to her osteopenia/osteoporosis. 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 Additionally, she is here to followup regarding thyroid studies that were performed on 01/15/2011. Does obtained 01/01/2011. Show a CD4 count of 400 at 29% with a viral load of less than 20 copies per mL. Her thyroid function studies were all essentially normal although all values except TSH were on the lower end of normal. Her TSH was within normal limits.  Assessment/Plan  1. HIV. Given her diagnosis of osteoporosis, we will plan on discontinuing her Truvada. However, before we do this we will need to obtain an HLA [id number] to determine fitness for abacavir therapy. If the HLA is negative, we will switch out, Truvada for Epzicom. She should plan on returning to clinic in 4 weeks for repeat labs and followup in 6-7 weeks.  2. Osteoporosis/Osteopenia. She should continue her followup with the orthopedist and scoliosis specialist. Written information on osteoporosis and fractures was provided for her with the opportunity for her to ask questions, should she have them.  She verbally acknowledged all information that was provided for her and agreed with plan of care.

## 2011-02-03 NOTE — Progress Notes (Deleted)
  Subjective:    Patient ID: Vanessa Beard, female    DOB: 1950-09-22, 60 y.o.   MRN: [id number]  HPI    Review of Systems     Objective:   Physical Exam        Assessment & Plan:

## 2011-02-07 LAB — T-HELPER CELL (CD4) - (RCID CLINIC ONLY)
CD4 % Helper T Cell: 13 — ABNORMAL LOW
CD4 T Cell Abs: 130 — ABNORMAL LOW

## 2011-02-10 DIAGNOSIS — E213 Hyperparathyroidism, unspecified: Secondary | ICD-10-CM | POA: Insufficient documentation

## 2011-02-10 LAB — HLA B*5701: HLA-B*5701: NEGATIVE

## 2011-02-10 MED ORDER — ABACAVIR SULFATE-LAMIVUDINE 600-300 MG PO TABS: 1.0000 | ORAL_TABLET | Freq: Every day | ORAL | Status: DC

## 2011-02-10 NOTE — Progress Notes (Signed)
[id number] was negative. Therefore, she should be switched from Truvada to Epzicom one tablet by mouth daily. Additionally, reports from scoliosis. Clinic show a PTH level of 116.6, which is significantly high. Based on this information, and upon recommendation of Dr. Alveda Reasons, we will refer her to endocrinology for further evaluation.

## 2011-02-10 NOTE — Progress Notes (Signed)
Addended by: Talmadge Chad A on: 02/10/2011 11:14 PM   Modules accepted: Orders

## 2011-02-11 LAB — T-HELPER CELL (CD4) - (RCID CLINIC ONLY)
CD4 % Helper T Cell: 13 — ABNORMAL LOW
CD4 T Cell Abs: 140 — ABNORMAL LOW

## 2011-02-13 LAB — T-HELPER CELL (CD4) - (RCID CLINIC ONLY)
CD4 % Helper T Cell: 20 — ABNORMAL LOW
CD4 T Cell Abs: 190 — ABNORMAL LOW

## 2011-02-17 LAB — T-HELPER CELL (CD4) - (RCID CLINIC ONLY)
CD4 % Helper T Cell: 15 — ABNORMAL LOW
CD4 T Cell Abs: 140 — ABNORMAL LOW

## 2011-02-21 LAB — T-HELPER CELL (CD4) - (RCID CLINIC ONLY)
CD4 % Helper T Cell: 18 % — ABNORMAL LOW (ref 33–55)
CD4 T Cell Abs: 160 uL — ABNORMAL LOW (ref 400–2700)

## 2011-02-24 ENCOUNTER — Other Ambulatory Visit: Payer: Self-pay | Admitting: *Deleted

## 2011-02-24 DIAGNOSIS — G479 Sleep disorder, unspecified: Secondary | ICD-10-CM

## 2011-02-24 MED ORDER — ZOLPIDEM TARTRATE 10 MG PO TABS: 10.0000 mg | ORAL_TABLET | Freq: Every evening | ORAL | Status: DC | PRN

## 2011-02-26 LAB — T-HELPER CELL (CD4) - (RCID CLINIC ONLY)
CD4 % Helper T Cell: 18 — ABNORMAL LOW
CD4 T Cell Abs: 180 — ABNORMAL LOW

## 2011-03-03 ENCOUNTER — Other Ambulatory Visit: Payer: Medicare Other

## 2011-03-03 ENCOUNTER — Other Ambulatory Visit: Payer: Self-pay | Admitting: Infectious Disease

## 2011-03-03 DIAGNOSIS — B20 Human immunodeficiency virus [HIV] disease: Secondary | ICD-10-CM

## 2011-03-04 ENCOUNTER — Other Ambulatory Visit: Payer: Self-pay | Admitting: *Deleted

## 2011-03-04 ENCOUNTER — Other Ambulatory Visit: Payer: Medicare Other

## 2011-03-04 ENCOUNTER — Other Ambulatory Visit: Payer: Self-pay | Admitting: Infectious Diseases

## 2011-03-04 DIAGNOSIS — S32030A Wedge compression fracture of third lumbar vertebra, initial encounter for closed fracture: Secondary | ICD-10-CM

## 2011-03-04 DIAGNOSIS — B20 Human immunodeficiency virus [HIV] disease: Secondary | ICD-10-CM

## 2011-03-04 LAB — COMPREHENSIVE METABOLIC PANEL
ALT: 12 U/L (ref 0–35)
AST: 29 U/L (ref 0–37)
Albumin: 3.9 g/dL (ref 3.5–5.2)
Alkaline Phosphatase: 96 U/L (ref 39–117)
BUN: 12 mg/dL (ref 6–23)
CO2: 26 mEq/L (ref 19–32)
Calcium: 9.1 mg/dL (ref 8.4–10.5)
Chloride: 103 mEq/L (ref 96–112)
Creat: 0.83 mg/dL (ref 0.50–1.10)
Glucose, Bld: 83 mg/dL (ref 70–99)
Potassium: 4.1 mEq/L (ref 3.5–5.3)
Sodium: 136 mEq/L (ref 135–145)
Total Bilirubin: 0.8 mg/dL (ref 0.3–1.2)
Total Protein: 9.4 g/dL — ABNORMAL HIGH (ref 6.0–8.3)

## 2011-03-04 LAB — CBC WITH DIFFERENTIAL/PLATELET
Basophils Absolute: 0 10*3/uL (ref 0.0–0.1)
Basophils Relative: 0 % (ref 0–1)
Eosinophils Absolute: 0.1 10*3/uL (ref 0.0–0.7)
Eosinophils Relative: 1 % (ref 0–5)
HCT: 38.3 % (ref 36.0–46.0)
Hemoglobin: 12.4 g/dL (ref 12.0–15.0)
Lymphocytes Relative: 31 % (ref 12–46)
Lymphs Abs: 1.2 10*3/uL (ref 0.7–4.0)
MCH: 28.9 pg (ref 26.0–34.0)
MCHC: 32.4 g/dL (ref 30.0–36.0)
MCV: 89.3 fL (ref 78.0–100.0)
Monocytes Absolute: 0.3 10*3/uL (ref 0.1–1.0)
Monocytes Relative: 8 % (ref 3–12)
Neutro Abs: 2.3 10*3/uL (ref 1.7–7.7)
Neutrophils Relative %: 60 % (ref 43–77)
Platelets: 151 10*3/uL (ref 150–400)
RBC: 4.29 MIL/uL (ref 3.87–5.11)
RDW: 13.4 % (ref 11.5–15.5)
WBC: 3.8 10*3/uL — ABNORMAL LOW (ref 4.0–10.5)

## 2011-03-04 MED ORDER — HYDROCODONE-IBUPROFEN 7.5-200 MG PO TABS: 1.0000 | ORAL_TABLET | Freq: Four times a day (QID) | ORAL | Status: DC | PRN

## 2011-03-04 NOTE — Telephone Encounter (Signed)
She was here for labs. Wants refill of pain med. Last fill over 30 days. Printed & given to Dr. Maurice March to sign

## 2011-03-05 LAB — T-HELPER CELL (CD4) - (RCID CLINIC ONLY)
CD4 % Helper T Cell: 20 % — ABNORMAL LOW (ref 33–55)
CD4 T Cell Abs: 230 uL — ABNORMAL LOW (ref 400–2700)

## 2011-03-06 LAB — HIV-1 RNA QUANT-NO REFLEX-BLD
HIV 1 RNA Quant: 24800 copies/mL — ABNORMAL HIGH (ref <20)
HIV-1 RNA Quant, Log: 4.39 {Log} — ABNORMAL HIGH (ref <1.30)

## 2011-03-13 ENCOUNTER — Telehealth: Payer: Self-pay | Admitting: *Deleted

## 2011-03-13 NOTE — Telephone Encounter (Signed)
Faxed endocrinology referral to Dr Daune Perch office for an appointment for this patient.

## 2011-03-17 ENCOUNTER — Ambulatory Visit: Payer: Medicare Other | Admitting: Infectious Diseases

## 2011-03-19 ENCOUNTER — Encounter: Payer: Self-pay | Admitting: Infectious Diseases

## 2011-03-19 ENCOUNTER — Ambulatory Visit (INDEPENDENT_AMBULATORY_CARE_PROVIDER_SITE_OTHER): Payer: Medicare Other | Admitting: Infectious Diseases

## 2011-03-19 VITALS — BP 132/87 | HR 74 | Temp 98.3°F | Ht 60.0 in | Wt 167.0 lb

## 2011-03-19 DIAGNOSIS — M899 Disorder of bone, unspecified: Secondary | ICD-10-CM

## 2011-03-19 DIAGNOSIS — M949 Disorder of cartilage, unspecified: Secondary | ICD-10-CM

## 2011-03-19 DIAGNOSIS — M8580 Other specified disorders of bone density and structure, unspecified site: Secondary | ICD-10-CM

## 2011-03-19 DIAGNOSIS — B20 Human immunodeficiency virus [HIV] disease: Secondary | ICD-10-CM

## 2011-03-19 MED ORDER — ABACAVIR SULFATE-LAMIVUDINE 600-300 MG PO TABS: 1.0000 | ORAL_TABLET | Freq: Every day | ORAL | Status: DC

## 2011-03-19 NOTE — Progress Notes (Signed)
  Subjective:    Patient ID: Vanessa Beard, female    DOB: 1951-02-04, 60 y.o.   MRN: [id number]  HPI 60 yo F previously taking ISN/TRV until she was switched due to concerns about bone mineral density. She was changed to EPZ/ISN but has been taking ISN alone since her last visit.  Today c/o back pain- fell on her tail bone 1 year ago and fractured her 2-3 vertebrae. Was also told that her thyroid fxn was off. She was asked to have parathyroid tests as well. She is interested in injections for her increasing her bone mineral density. Concerned that she is gaining weight.  Last CD4 230 and VL 24,800 (03-04-11). This is discussed with her and states she is exhausted from taking drugs.    Review of Systems  Constitutional: Negative for appetite change.  Gastrointestinal: Negative for diarrhea and constipation.  Genitourinary: Negative for dysuria.       Objective:   Physical Exam  Constitutional: She appears well-developed and well-nourished.  Eyes: EOM are normal. Pupils are equal, round, and reactive to light.  Neck: Neck supple.  Cardiovascular: Normal rate, regular rhythm and normal heart sounds.   Pulmonary/Chest: Effort normal and breath sounds normal.  Abdominal: Soft. Bowel sounds are normal. There is no tenderness.  Lymphadenopathy:    She has no cervical adenopathy.          Assessment & Plan:

## 2011-03-19 NOTE — Assessment & Plan Note (Signed)
We discussed checking a PTH on her but suspect it will be normal with a normal Calcium. She has an appt with Dr Sharl Ma in January and will defer this to his expertise.

## 2011-03-19 NOTE — Assessment & Plan Note (Signed)
Pt counseled that drug monotherapy will not be effective and may lead to resistance. I let her know that she had a negative HLA B57 which decreases her risk of abacavir reaction. It does not rule out that she can have a different type of reaction. She agrees to start the epzicom. Will see her back in 2 months for eval of medication tolerance and for HIV RNA and genotype. She is offered condoms, has gotten flu shot, pneumpovax is uptodate.

## 2011-04-01 ENCOUNTER — Telehealth: Payer: Self-pay | Admitting: *Deleted

## 2011-04-01 NOTE — Telephone Encounter (Signed)
States her throat hurts on the L side. She wanted meds called in. Told her she will need to be seen before the md will prescribe. States she does not have a PCP. Told her she can get one with her insurance. Transferred to front to make an appt

## 2011-04-02 ENCOUNTER — Ambulatory Visit (INDEPENDENT_AMBULATORY_CARE_PROVIDER_SITE_OTHER): Payer: Medicare Other | Admitting: Internal Medicine

## 2011-04-02 ENCOUNTER — Other Ambulatory Visit: Payer: Self-pay | Admitting: *Deleted

## 2011-04-02 ENCOUNTER — Encounter: Payer: Self-pay | Admitting: Internal Medicine

## 2011-04-02 ENCOUNTER — Other Ambulatory Visit: Payer: Self-pay | Admitting: Internal Medicine

## 2011-04-02 VITALS — BP 119/81 | HR 73 | Temp 98.2°F | Ht 60.0 in | Wt 159.0 lb

## 2011-04-02 DIAGNOSIS — B20 Human immunodeficiency virus [HIV] disease: Secondary | ICD-10-CM

## 2011-04-02 DIAGNOSIS — J029 Acute pharyngitis, unspecified: Secondary | ICD-10-CM

## 2011-04-02 DIAGNOSIS — G479 Sleep disorder, unspecified: Secondary | ICD-10-CM

## 2011-04-02 MED ORDER — ABACAVIR SULFATE-LAMIVUDINE 600-300 MG PO TABS: 1.0000 | ORAL_TABLET | Freq: Every day | ORAL | Status: DC

## 2011-04-02 MED ORDER — PENICILLIN V POTASSIUM 500 MG PO TABS: 500.0000 mg | ORAL_TABLET | Freq: Three times a day (TID) | ORAL | Status: AC

## 2011-04-02 MED ORDER — ZOLPIDEM TARTRATE 10 MG PO TABS: 10.0000 mg | ORAL_TABLET | Freq: Every evening | ORAL | Status: DC | PRN

## 2011-04-02 MED ORDER — RALTEGRAVIR POTASSIUM 400 MG PO TABS: 400.0000 mg | ORAL_TABLET | Freq: Two times a day (BID) | ORAL | Status: DC

## 2011-04-02 NOTE — Progress Notes (Signed)
  Subjective:    Patient ID: Vanessa Beard, female    DOB: [birth year], 60 y.o.   MRN: [id number]  HPIthis patient comes in today for follow up for a sick visit. She is complaining of sore throat that she's had for about 3-4 days. She has had a subjective fever and chills as well as body aches. She's had no sick contacts. Incidentally today she also tells me that she has not been taking her HIV medications. She tells me initiallySHE HAS ONLY BEEN TAKING her Isentress, and refused to take the Epzicom because she is afraid of the side effects.  After calling the pharmacy though, she has never filled either prescription.  She developed some osteoporosis that she feels is exclusively due to Tenofovir and therefore does not want that anymore.  She was therefore switched to Epzicom, but decided not to take it either.  She is HLA B5701 negative.     Review of Systems  Constitutional: Positive for fever. Negative for appetite change and unexpected weight change.  HENT: Positive for sore throat. Negative for congestion and mouth sores.   Cardiovascular: Negative for leg swelling.  Gastrointestinal: Negative for nausea, diarrhea and constipation.  Genitourinary: Negative for dysuria and frequency.  Musculoskeletal: Positive for myalgias. Negative for arthralgias.  Skin: Negative for rash.  Neurological: Negative for headaches.  Psychiatric/Behavioral: The patient is nervous/anxious.        Objective:   Physical Exam  Constitutional: She is oriented to person, place, and time. She appears well-developed and well-nourished. No distress.  HENT:  Mouth/Throat: Oropharynx is clear and moist. No oropharyngeal exudate.  Eyes: No scleral icterus.  Cardiovascular: Normal rate, regular rhythm and normal heart sounds.  Exam reveals no gallop.   No murmur heard. Pulmonary/Chest: Effort normal and breath sounds normal. No respiratory distress.  Abdominal: Soft. Bowel sounds are normal. There is no  tenderness.  Lymphadenopathy:    She has cervical adenopathy.  Neurological: She is alert and oriented to person, place, and time.  Skin: Skin is warm and dry. No erythema.  Psychiatric: She has a normal mood and affect. Her behavior is normal. Thought content normal.          Assessment & Plan:

## 2011-04-02 NOTE — Assessment & Plan Note (Signed)
  Talk with the patient regarding her HIV. I discussed the benefits of therapy with antiretroviral medications. I also discussed with her at length the difficulty that will occur she does not take her medications including the development of AIDS and subsequent opportunistic infections. She did express to me that she does not want to take medicines anymore and she is tired of taking medications and she wants to have a normal life.I did explain to her that taking her medications are paramount in having a good quality of life and that some side effects can be dealt with as they come up. I did express to her in no uncertain terms that her life expectancy is very short if she does not start to take her medications. She did voice her understanding and she is going to think about whether or not she wants to take her medications. The prescriptions have been sent to the pharmacy for her to pick up and I did tell her that she is welcome to continue to followup either way but I would like to check her blood tests in about one month's time if she does decide to take the medicines and to followup in about 2 months.

## 2011-04-02 NOTE — Assessment & Plan Note (Signed)
I did send off a throat culture and gave her a prescription empirically for penicillin versed presumed strep throat.

## 2011-04-02 NOTE — Patient Instructions (Signed)
Take your medicine

## 2011-04-04 LAB — CULTURE, GROUP A STREP: Organism ID, Bacteria: NORMAL

## 2011-04-07 ENCOUNTER — Telehealth: Payer: Self-pay | Admitting: Licensed Clinical Social Worker

## 2011-04-07 NOTE — Telephone Encounter (Signed)
Patient states that she still has a sore throat and wants a stronger antibiotic.

## 2011-04-07 NOTE — Telephone Encounter (Signed)
Her culture was negative and so is viral which is why it is not working.  It is also probably exacerbated by her uncontrolled HIV.

## 2011-04-07 NOTE — Telephone Encounter (Signed)
Patient was very irate and she stated that her sore throat has nothing to do with her HIV being uncontrolled, and she never wants to see Dr. Luciana Axe again because he won't treat her for her sore throat. I explained to her that her strep was negative and she did not have an infection to treat with antibiotics. She did not agree.

## 2011-05-08 ENCOUNTER — Encounter: Payer: Self-pay | Admitting: Advanced Practice Midwife

## 2011-05-08 ENCOUNTER — Other Ambulatory Visit (HOSPITAL_COMMUNITY)
Admission: RE | Admit: 2011-05-08 | Discharge: 2011-05-08 | Disposition: A | Discharge status: Home / Self Care | Payer: Medicare Other | Source: Ambulatory Visit | Attending: Advanced Practice Midwife | Admitting: Advanced Practice Midwife

## 2011-05-08 ENCOUNTER — Ambulatory Visit (INDEPENDENT_AMBULATORY_CARE_PROVIDER_SITE_OTHER): Payer: Medicare Other | Admitting: Advanced Practice Midwife

## 2011-05-08 DIAGNOSIS — Z1231 Encounter for screening mammogram for malignant neoplasm of breast: Secondary | ICD-10-CM

## 2011-05-08 DIAGNOSIS — Z0000 Encounter for general adult medical examination without abnormal findings: Secondary | ICD-10-CM

## 2011-05-08 DIAGNOSIS — Z124 Encounter for screening for malignant neoplasm of cervix: Secondary | ICD-10-CM | POA: Insufficient documentation

## 2011-05-08 DIAGNOSIS — N951 Menopausal and female climacteric states: Secondary | ICD-10-CM

## 2011-05-08 DIAGNOSIS — Z01419 Encounter for gynecological examination (general) (routine) without abnormal findings: Secondary | ICD-10-CM

## 2011-05-08 NOTE — Patient Instructions (Signed)
Health Recommendations for Postmenopausal Women Based on the Results of the Women's Health Initiative [facility]) and Other Studies The WHI is a major 15-year research program to address the most common causes of death, disability and poor quality of life in postmenopausal women. Some of these causes are heart disease, cancer, bone loss (osteoporosis) and others. Taking into account all of the findings from [facility] and other studies, here are bottom-line health recommendations for women: CARDIOVASCULAR DISEASE Heart Disease: A heart attack is a medical emergency. Know the signs and symptoms of a heart attack. Hormone therapy should not be used to prevent heart disease. In women with heart disease, hormone therapy should not be used to prevent further disease. Hormone therapy increases the risk of blood clots. Below are things women can do to reduce their risk for heart disease.   Do not smoke. If you smoke, quit. Women who smoke are 2 to 6 times more likely to suffer a heart attack than non-smoking women.   Aim for a healthy weight. Being overweight causes many preventable deaths. Eat a healthy and balanced diet and drink an adequate amount of liquids.   Get moving. Make a commitment to be more physically active. Aim for 30 minutes of activity on most, if not all days of the week.   Eat for heart health. Choose a diet that is low in saturated fat, trans fat, and cholesterol. Include whole grains, vegetables, and fruits. Read the labels on the food container before buying it.   Know your numbers. Ask your caregiver to check your blood pressure, cholesterol (total, HDL, LDL, triglycerides) and blood glucose. Work with your caregiver to improve any numbers that are not normal.   High blood pressure. Limit or stop your table salt intake (try salt substitute and food seasonings), avoid salty foods and drinks. Read the labels on the food container before buying it. Avoid becoming overweight by eating well and  exercising.  STROKE  Stroke is a medical emergency. Stroke can be the result of a blood clot in the blood vessel in the brain or by a brain hemorrhage (bleeding). Know the signs and symptoms of a stroke. To lower the risk of developing a stroke:  Avoid fatty foods.   Quit smoking.   Control your diabetes, blood pressure, and irregular heart rate.  THROMBOPHLIBITIS (BLOOD CLOT) OF THE LEG  Hormone treatment is a big cause of developing blood clots in the leg. Becoming overweight and leading a stationary lifestyle also may contribute to developing blood clots. Controlling your diet and exercising will help lower the risk of developing blood clots. CANCER SCREENING  Breast Cancer: Women should take steps to reduce their risk of breast cancer. This includes having regular mammograms, monthly self breast exams and regular breast exams by your caregiver. Have a mammogram every one to two years if you are 22 to 62 years old. Have a mammogram annually if you are 92 years old or older depending on your risk factors. Women who are high risk for breast cancer may need more frequent mammograms. There are tests available (testing the genes in your body) if you have family history of breast cancer called BRCA 1 and 2. These tests can help determine the risks of developing breast cancer.   Intestinal or Stomach Cancer: Women should talk to their caregiver about when to start screening, what tests and how often they should be done, and the benefits and risks of doing these tests. Tests to consider are a rectal exam, fecal  occult blood, sigmoidoscopy, colononoscoby, barium enema and upper GI series of the stomach. Depending on the age, you may want to get a medical and family history of colon cancer. Women who are high risk may need to be screened at an earlier age and more often.   Cervical Cancer: A Pap test of the cervix should be done every year and every 3 years when there has been three straight years of a  normal Pap test. Women with an abnormal Pap test should be screened more often or have a cervical biopsy depending on your caregiver's recommendation.   Uterine Cancer: If you have vaginal bleeding after you are in the menopause, it should be evaluated by your caregiver.   Ovarian cancer: There are no reliable tests available to screen for ovarian cancer at this time except for yearly pelvic exams.   Lung Cancer: Yearly chest X-rays can detect lung cancer and should be done on high risk women, such as cigarette smokers and women with chronic lung disease (emphysemia).   Skin Cancer: A complete body skin exam should be done at your yearly examination. Avoid overexposure to the sun and ultraviolet light lamps. Use a strong sun block cream when in the sun. All of these things are important in lowering the risk of skin cancer.  MENOPAUSE Menopause Symptoms: Hormone therapy products are effective for treating symptoms associated with menopause:  Moderate to severe hot flashes.   Night sweats.   Mood swings.   Headaches.   Tiredness.   Loss of sex drive.   Insomnia.   Other symptoms.  However, hormone therapy products carry serious risks, especially in older women. Women who use or are thinking about using estrogen or estrogen with progestin treatments should discuss that with their caregiver. Your caregiver will know if the benefits outweigh the risks. The Food and Drug Administration (FDA) has concluded that hormone therapy should be used only at the lowest doses and for the shortest amount of time to reach treatment goals. It is not known at what doses there may be less risk of serious side effects. There are other treatments such as herbal medication (not controlled or regulated by the FDA), group therapy, counseling and acupuncture that may be helpful. OSTEOPOROSIS Protecting Against Bone Loss and Preventing Fracture: If hormone therapy is used for prevention of bone loss (osteoporosis),  the risks for bone loss must outweigh the risk of the therapy. Women considering taking hormone therapy for bone loss should ask their health care providers about other medications (fosamax and boniva) that are considered safe and effective for preventing bone loss and bone fractures. To guard against bone loss or fractures, it is recommended that women should take at least 1000-1500 mg of calcium and 400-800 IU of vitamin D daily in divided doses. Smoking and excessive alcohol intake increases the risk of osteoporosis. Eat foods rich in calcium and vitamin D and do weight bearing exercises several times a week as your caregiver suggests. DIABETES Diabetes Melitus: Women with Type I or Type 2 diabetes should keep their diabetes in control with diet, exercise and medication. Avoid too many sweets, starchy and fatty foods. Being overweight can affect your diabetes. COGNITION AND MEMORY Cognition and Memory: Menopausal hormone therapy is not recommended for the prevention of cognitive disorders such as Alzheimer's disease or memory loss. WHI found that women treated with hormone therapy have a greater risk of developing dementia.  DEPRESSION  Depression may occur at any age, but is common in elderly women.   The reasons may be because of physical, medical, social (loneliness), financial and/or economic problems and needs. Becoming involved with church, volunteer or social groups, seeking treatment for any physical or medical problems is recommended. Also, look into getting professional advice for any economic or financial problems. ACCIDENTS  Accidents are common and can be serious in the elderly woman. Prepare your house to prevent accidents. Eliminate throw rugs, use hip protectors, place hand bars in the bath, shower and toilet areas. Avoid wearing high heel shoes and walking on wet, snowy and icy areas. Stop driving if you have vision, hearing problems or are unsteady with you movements and  reflexes. RHEUMATOID ARTHRITIS Rheumatoid arthritis causes pain, swelling and stiffness of your bone joints. It can limit many of your activities. Over-the-counter medications may help, but prescription medications may be necessary. Talk with your caregiver about this. Exercise (walking, water aerobics), good posture, using splints on painful joints, warm baths or applying warm compresses to stiff joints and cold compresses to painful joints may be helpful. Smoking and excessive drinking may worsen the symptoms of arthritis. Seek help from a physical therapist if the arthritis is becoming a problem with your daily activities. IMMUNIZATIONS  Several immunizations are important to have during your senior years, including:   Tetanus and a diptheria shot booster every 10 years.   Influenza every year before the flu season begins.   Pneumonia vaccine.   Shingles vaccine.   Others as indicated (example: H1N1 vaccine).  Document Released: 06/27/2005 Document Revised: 01/15/2011 Document Reviewed: 02/21/2008 [facility] Patient Information 2012 [place], [place].

## 2011-05-08 NOTE — Progress Notes (Signed)
Subjective:    Patient ID: Vanessa Beard, female    DOB: March 15, 1951, 60 y.o.   MRN: [id number]  HPI    Review of Systems     Objective:   Physical Exam        Assessment & Plan:   Subjective:    TAVI [name] is a 60 y.o. female who presents for annual exam. The patient has no complaints today. The patient is not sexually active. GYN screening history: last pap: was abnormal: LGSIL in 2011. The patient is not taking hormone replacement therapy. Patient denies post-menopausal vaginal bleeding.. The patient wears seatbelts: yes. The patient participates in regular exercise: yes. Has the patient ever been transfused or tattooed?: not asked. The patient reports that there is not domestic violence in her life.  Sees Dr Ninetta Lights for HIV.  Is not currently taking HIV drugs due to various reasons.  Last pap LGSIL in 2011.  Needs Mammogram Has occasional hot flashes, no vaginal dryness  Menstrual History: OB History    Grav Para Term Preterm Abortions TAB SAB Ect Mult Living   5 3 3  0 2 1 0 1 0 2      Menarche age: not asked  No LMP recorded. Patient is postmenopausal.    The following portions of the patient's history were reviewed and updated as appropriate: allergies, current medications, past family history, past medical history, past social history, past surgical history and problem list.  Review of Systems Pertinent items are noted in HPI.    Objective:    BP 144/98  Pulse 85  Temp(Src) 97.3 F (36.3 C) (Oral)  Ht 5' (1.524 m)  Wt 161 lb 14.4 oz (73.437 kg)  BMI 31.62 kg/m2  General Appearance:    Alert, cooperative, no distress, appears stated age  Head:    Normocephalic, without obvious abnormality, atraumatic  Eyes:      Ears:      Nose:     Throat:     Neck:   Supple, symmetrical, trachea midline, no adenopathy;    thyroid:  no enlargement/tenderness/nodules; no carotid   bruit or JVD  Back:     Symmetric, no curvature, ROM normal, no CVA  tenderness  Lungs:     Clear to auscultation bilaterally, respirations unlabored  Chest Wall:    No tenderness or deformity   Heart:    Regular rate and rhythm, S1 and S2 normal, no murmur, rub   or gallop  Breast Exam:    No tenderness, masses, or nipple abnormality  Abdomen:     Soft, non-tender, bowel sounds active all four quadrants,    no masses, no organomegaly  Genitalia:    Normal female without lesion, discharge or tenderness  Rectal:    Normal tone, normal prostate, no masses or tenderness;   guaiac negative stool  Extremities:   Extremities normal, atraumatic, no cyanosis or edema  Pulses:   2+ and symmetric all extremities  Skin:   Skin color, texture, turgor normal, no rashes or lesions  Lymph nodes:   Cervical, supraclavicular, and axillary nodes normal  Neurologic:   CNII-XII intact, normal strength, sensation and reflexes    throughout      Assessment:    Normal gyn exam Menopause    Plan:    All questions answered. Await pap smear results. Breast self exam technique reviewed and patient encouraged to perform self-exam monthly. Follow up in 6 months. Mammogram. Thin prep Pap smear.  Follow up with other doctors for  other medical issues

## 2011-05-23 ENCOUNTER — Encounter: Payer: Self-pay | Admitting: *Deleted

## 2011-06-03 ENCOUNTER — Ambulatory Visit (INDEPENDENT_AMBULATORY_CARE_PROVIDER_SITE_OTHER): Payer: Medicare Other | Admitting: Internal Medicine

## 2011-06-03 ENCOUNTER — Encounter: Payer: Self-pay | Admitting: Internal Medicine

## 2011-06-03 VITALS — BP 116/55 | HR 99 | Temp 98.2°F | Ht 60.0 in | Wt 165.0 lb

## 2011-06-03 DIAGNOSIS — B20 Human immunodeficiency virus [HIV] disease: Secondary | ICD-10-CM

## 2011-06-03 DIAGNOSIS — J029 Acute pharyngitis, unspecified: Secondary | ICD-10-CM

## 2011-06-03 DIAGNOSIS — M545 Low back pain, unspecified: Secondary | ICD-10-CM

## 2011-06-03 MED ORDER — EMTRICITABINE-TENOFOVIR DF 200-300 MG PO TABS: 1.0000 | ORAL_TABLET | Freq: Every day | ORAL | Status: DC

## 2011-06-03 MED ORDER — RALTEGRAVIR POTASSIUM 400 MG PO TABS: 400.0000 mg | ORAL_TABLET | Freq: Two times a day (BID) | ORAL | Status: DC

## 2011-06-03 NOTE — Assessment & Plan Note (Signed)
After discussing treatment again with her and the different options, she is now refusing Epzicom and so will go back to Isentress with Truvada.  I did remind her of the concern with resistance anytime that she stops treatment and that this will need to be watched closely.  I did remind her to use condoms with all sexual activity.  She will return in 4 weeks for repeat labs after reinitiating therapy.

## 2011-06-03 NOTE — Progress Notes (Signed)
  Subjective:    Patient ID: Vanessa Beard, female    DOB: August 16, 1950, 61 y.o.   MRN: [id number]  HPI The patient comes in with multiple complaints today.  Regarding her HIV, at her last visit, she angrily stated she did not want treatment for her HIV due to the side effects of the medications.  She had been diagnosed with osteoporosis and was told to stop tenofovir.  She was therefore seen by her primary provider and started on Isentress with Epzicom (HLA negative) but she did not start.  She had told me that she was taking monotherapy with Isentress, but the pharmacy confirmed she had never filled either prescription.  She continued to adamentaly state she would not take therapy due to the side effects and the natural history of untreated HIV was explained to her at the end of the appointment.  She came today however interested again in starting therapy.  She had tried Epzicom but felt it made her tired and stopped after 3 days.  She did continually express her anger with her 042 throughout the visit.     Additionally, she has a cough and chest congestion and back/neck pain that is at its baseline.  She is asking for an antibiotic and vicodin.    Review of Systems  Constitutional: Positive for fatigue. Negative for fever, chills, activity change and unexpected weight change.  HENT: Negative for sore throat and trouble swallowing.   Respiratory: Positive for cough. Negative for choking, chest tightness, shortness of breath and wheezing.   Cardiovascular: Negative for chest pain and leg swelling.  Gastrointestinal: Negative for nausea, abdominal pain, diarrhea and constipation.  Genitourinary: Negative for dysuria.  Musculoskeletal: Negative for myalgias and arthralgias.  Skin: Negative for rash.  Neurological: Negative for light-headedness and headaches.  Hematological: Negative for adenopathy.  Psychiatric/Behavioral: Positive for agitation. Negative for dysphoric mood. The patient is not  nervous/anxious.        Objective:   Physical Exam  Constitutional: She is oriented to person, place, and time. She appears well-developed and well-nourished. No distress.  HENT:  Mouth/Throat: Oropharynx is clear and moist. No oropharyngeal exudate.  Cardiovascular: Normal rate, regular rhythm and normal heart sounds.  Exam reveals no gallop and no friction rub.   No murmur heard. Pulmonary/Chest: Effort normal and breath sounds normal. No respiratory distress. She has no wheezes. She has no rales.  Abdominal: Soft. Bowel sounds are normal. She exhibits no distension. There is no tenderness. There is no rebound.  Lymphadenopathy:    She has no cervical adenopathy.  Neurological: She is alert and oriented to person, place, and time.  Skin: Skin is warm and dry. No rash noted. No erythema.  Psychiatric:       Agitated, poor insight          Assessment & Plan:

## 2011-06-03 NOTE — Assessment & Plan Note (Signed)
She has had chronic back pain.  There were no concerning symptoms regarding bowel or bladder dysfunction.  I discussed with her the different treatment options for this including physical therapy and antiinflammatories such as ibuprofen.  The patient though was not interested in this and only asked for vicodin. I have offered to refer her for a PCP.

## 2011-06-03 NOTE — Assessment & Plan Note (Signed)
Her sore throat has resolved but now she has URI symptoms.  There is no fever, chills, myalgias or signs concerning for either flu or pneumonia.  I discussed with her symptomatic care and that antibiotics are not indicated for URIs.  I discussed various OTC options for congestion and cough.

## 2011-06-03 NOTE — Patient Instructions (Signed)
Take the Truvada and Isentress and follow up blood tests in 1 month.  Follow up with Brad 2 weeks after that.

## 2011-06-04 ENCOUNTER — Telehealth: Payer: Self-pay | Admitting: *Deleted

## 2011-06-04 NOTE — Telephone Encounter (Signed)
She called to ask for vicodin. States Dr. Luciana Axe refused this. She wants Brad to order it. I told her the NP is still out & the md she saw is not likely to give it since he documented otherwise. She does not want to take OTC since they do not work & ibuprofen "messes with my stomach". Mid back pain is 8 or 9 on a 1-10 scale. She is not interested in OTC or PT I suggested she get & discuss with her pcp. She has not found one yet. I urged her to find one close to her home to make visits easier. Told her since she has insurance it should be easy to find one. She was displeased & asked what her dr here was for. Told her Dr. Luciana Axe specialized in infectious diseases & will handle her HIV. Having a pcp who has more appt openings will make her life easier when she has non-infectious disease problems.   She wants me to ask Dr. Luciana Axe again for the vicodin. Told her I will send him this message. She sounded very angry thru this call

## 2011-06-04 NOTE — Telephone Encounter (Signed)
I gave her the message & offered to set up with PT. She was not happy with that answer. I told her she can get established with a PCP & perhaps that md would see it differently. States she was going to do that

## 2011-06-04 NOTE — Telephone Encounter (Signed)
I agree with everything you said Kennon Rounds, Thanks.  Physical therapy would be the best way to address this.

## 2011-06-11 ENCOUNTER — Ambulatory Visit (HOSPITAL_COMMUNITY): Payer: Medicare Other

## 2011-06-26 ENCOUNTER — Other Ambulatory Visit: Payer: Self-pay | Admitting: *Deleted

## 2011-06-26 DIAGNOSIS — G479 Sleep disorder, unspecified: Secondary | ICD-10-CM

## 2011-06-26 MED ORDER — ZOLPIDEM TARTRATE 10 MG PO TABS: 10.0000 mg | ORAL_TABLET | Freq: Every evening | ORAL | Status: DC | PRN

## 2011-07-01 DIAGNOSIS — S32030A Wedge compression fracture of third lumbar vertebra, initial encounter for closed fracture: Secondary | ICD-10-CM | POA: Insufficient documentation

## 2011-07-01 DIAGNOSIS — E079 Disorder of thyroid, unspecified: Secondary | ICD-10-CM | POA: Insufficient documentation

## 2011-07-01 DIAGNOSIS — D649 Anemia, unspecified: Secondary | ICD-10-CM | POA: Insufficient documentation

## 2011-07-01 NOTE — Assessment & Plan Note (Signed)
Referral needs to be made to endocrinology for further evaluation possible hypoparathyroidism. She verbally understood. The importance of this referral.

## 2011-07-01 NOTE — Progress Notes (Signed)
Subjective:    Patient ID: Vanessa Beard is a 61 y.o. female.  Chief Complaint: HIV Follow-up Visit Vanessa Beard is here for follow-up of HIV infection. She is feeling unchanged since her last visit.  She claims continued adherence to therapy with good tolerance and no complications. There are additional complaints. Lower back pain continues to persist as well as feeling fatigued.  Data Review: Diagnostic studies reviewed.  Review of Systems - General ROS: positive for  - fatigue and malaise negative for - chills or fever Psychological ROS: positive for - depression negative for - anxiety, behavioral disorder, irritability, memory difficulties or mood swings Ophthalmic ROS: negative ENT ROS: negative Endocrine ROS: negative Breast ROS: negative for breast lumps Respiratory ROS: no cough, shortness of breath, or wheezing Cardiovascular ROS: no chest pain or dyspnea on exertion Gastrointestinal ROS: no abdominal pain, change in bowel habits, or black or bloody stools Musculoskeletal ROS: positive for - pain in back - lower Neurological ROS: no TIA or stroke symptoms Dermatological ROS: negative for rash and skin lesion changes  Objective:   General appearance: alert and cooperative Head: Normocephalic, without obvious abnormality, atraumatic Eyes: conjunctivae/corneas clear. PERRL, EOM's intact. Fundi benign. Ears: normal TM's and external ear canals both ears Throat: lips, mucosa, and tongue normal; teeth and gums normal Neck: no adenopathy, no carotid bruit, no JVD, supple, symmetrical, trachea midline and thyroid not enlarged, symmetric, no tenderness/mass/nodules Back: Marked kyphosis noted Resp: clear to auscultation bilaterally Cardio: regular rate and rhythm, S1, S2 normal, no murmur, click, rub or gallop GI: soft, non-tender; bowel sounds normal; no masses,  no organomegaly Extremities: extremities normal, atraumatic, no cyanosis or edema Skin: Skin color,  texture, turgor normal. No rashes or lesions Neurologic: Grossly normal Psych:  No vegetative signs or delusional behaviors noted.    Laboratory: From 01/01/2011 ,  CD4 count was 400 c/cmm @ 29 %. Viral load <20 copies/ml.     Assessment/Plan:   Human Immunodeficiency Virus (HIV) Disease Clinically stable on current regimen. Continue present management.  Counseling provided on prevention of transmission of HIV. Condoms offered:  Refused Medication adherence discussed with patient. Referrals: Endocrinology Follow up visit in 4 months with labs 2 weeks prior to appointment. Patient verbally acknowledged information provided to them and agreed with plan of care.   Compression fracture of L3 lumbar vertebra Is followed by orthopedics, who diagnosed thyroid dysfunction, which may be the cause of her compression fracture. She'll continue followup with orthopedics, but thyroid dysfunction must be addressed by endocrinology.  Thyroid dysfunction Referral needs to be made to endocrinology for further evaluation possible hypoparathyroidism. She verbally understood. The importance of this referral.  Anemia Vitamin B 12 injection today.     Vanessa Lamartina A. Sundra Aland, MS, [facility] [phone]  07/01/2011, 12:19 PM

## 2011-07-01 NOTE — Assessment & Plan Note (Signed)
Is followed by orthopedics, who diagnosed thyroid dysfunction, which may be the cause of her compression fracture. She'll continue followup with orthopedics, but thyroid dysfunction must be addressed by endocrinology.

## 2011-07-01 NOTE — Assessment & Plan Note (Signed)
Vitamin B12 injection today

## 2011-07-01 NOTE — Assessment & Plan Note (Signed)
Clinically stable on current regimen. Continue present management.  Counseling provided on prevention of transmission of HIV. Condoms offered:  Refused Medication adherence discussed with patient. Referrals: Endocrinology Follow up visit in 4 months with labs 2 weeks prior to appointment. Patient verbally acknowledged information provided to them and agreed with plan of care.

## 2011-07-04 ENCOUNTER — Other Ambulatory Visit: Payer: Medicare Other

## 2011-07-07 ENCOUNTER — Ambulatory Visit (INDEPENDENT_AMBULATORY_CARE_PROVIDER_SITE_OTHER): Payer: Medicare Other | Admitting: Adult Health

## 2011-07-07 ENCOUNTER — Other Ambulatory Visit: Payer: Self-pay | Admitting: *Deleted

## 2011-07-07 ENCOUNTER — Encounter: Payer: Self-pay | Admitting: Adult Health

## 2011-07-07 VITALS — BP 146/94 | HR 98 | Temp 98.2°F | Wt 153.0 lb

## 2011-07-07 DIAGNOSIS — S32030A Wedge compression fracture of third lumbar vertebra, initial encounter for closed fracture: Secondary | ICD-10-CM

## 2011-07-07 DIAGNOSIS — B20 Human immunodeficiency virus [HIV] disease: Secondary | ICD-10-CM

## 2011-07-07 DIAGNOSIS — S32009A Unspecified fracture of unspecified lumbar vertebra, initial encounter for closed fracture: Secondary | ICD-10-CM

## 2011-07-07 DIAGNOSIS — M545 Low back pain, unspecified: Secondary | ICD-10-CM

## 2011-07-07 MED ORDER — RALTEGRAVIR POTASSIUM 400 MG PO TABS: 400.0000 mg | ORAL_TABLET | Freq: Two times a day (BID) | ORAL | Status: DC

## 2011-07-07 MED ORDER — EMTRICITABINE 200 MG PO CAPS: 200.0000 mg | ORAL_CAPSULE | Freq: Every day | ORAL | Status: AC

## 2011-07-07 MED ORDER — RILPIVIRINE HCL 25 MG PO TABS: 25.0000 mg | ORAL_TABLET | Freq: Every day | ORAL | Status: DC

## 2011-07-07 MED ORDER — HYDROCODONE-IBUPROFEN 7.5-200 MG PO TABS: 1.0000 | ORAL_TABLET | Freq: Four times a day (QID) | ORAL | Status: DC | PRN

## 2011-07-07 NOTE — Progress Notes (Signed)
Subjective:    Patient ID: Vanessa Beard is a 61 y.o. female.  Chief Complaint: HIV Follow-up Visit Vanessa Beard is here for follow-up of HIV infection. She is feeling worse since her last visit.  Has remained off her antiretrovirals as she did not want to take Epzicom and she did not want to take Truvada.   There are additional complaints. Lower back pain persists.  Data Review: Diagnostic studies reviewed.  Review of Systems - General ROS: positive for  - fatigue, malaise and sleep disturbance Psychological ROS: positive for - anxiety and depression Respiratory ROS: no cough, shortness of breath, or wheezing Cardiovascular ROS: no chest pain or dyspnea on exertion Musculoskeletal ROS: positive for - pain in back - lower Neurological ROS: no TIA or stroke symptoms Dermatological ROS: negative  Objective:   General appearance: alert, cooperative and no distress Head: Normocephalic, without obvious abnormality, atraumatic Eyes: conjunctivae/corneas clear. PERRL, EOM's intact. Fundi benign. Ears: normal TM's and external ear canals both ears Throat: lips, mucosa, and tongue normal; teeth and gums normal Resp: clear to auscultation bilaterally Cardio: regular rate and rhythm, S1, S2 normal, no murmur, click, rub or gallop Extremities: extremities normal, atraumatic, no cyanosis or edema Skin: Skin color, texture, turgor normal. No rashes or lesions Neurologic: Alert and oriented X 3, normal strength and tone. Normal symmetric reflexes. Normal coordination and gait Psych:  No vegetative signs or delusional behaviors noted.    Laboratory:  HIV 1 RNA Quant (copies/mL)  Date Value  03/04/2011 24800*  01/01/2011 <20   06/11/2010 <20 copies/mL      CD4 T Cell Abs (cmm)  Date Value  03/04/2011 230*  01/01/2011 400   06/11/2010 280*     CD4 % Helper T Cell (%)  Date Value  03/04/2011 20*  01/01/2011 29*  06/11/2010 23*     Hep B S Ab (no units)  Date Value  10/29/2007  NEG      Hepatitis B Surface Ag (no units)  Date Value  10/29/2007 NEG      HCV Ab (no units)  Date Value  10/29/2007 REACTIVE*          Assessment/Plan:   Human Immunodeficiency Virus (HIV) Disease We'll resume Isentress, and start Emtriva plus Edurant. Return to clinic in 4 weeks for labs 6 weeks for followup.  BACK PAIN, LUMBAR Renew her Vicoprofen.     Cosimo Schertzer A. Sundra Aland, MS, [facility] for Infectious Disease [phone]  07/07/2011, 4:50 PM

## 2011-07-07 NOTE — Assessment & Plan Note (Signed)
Renew her Vicoprofen.

## 2011-07-07 NOTE — Assessment & Plan Note (Signed)
We'll resume Isentress, and start Emtriva plus Edurant. Return to clinic in 4 weeks for labs 6 weeks for followup.

## 2011-07-09 ENCOUNTER — Ambulatory Visit (HOSPITAL_COMMUNITY): Payer: Medicare Other

## 2011-07-09 NOTE — Telephone Encounter (Signed)
Opened in error

## 2011-07-29 ENCOUNTER — Ambulatory Visit: Payer: Medicare Other | Admitting: Physical Therapy

## 2011-08-01 ENCOUNTER — Ambulatory Visit (HOSPITAL_COMMUNITY)
Admission: RE | Admit: 2011-08-01 | Discharge: 2011-08-01 | Disposition: A | Discharge status: Home / Self Care | Payer: Medicare Other | Source: Ambulatory Visit | Attending: Advanced Practice Midwife | Admitting: Advanced Practice Midwife

## 2011-08-01 DIAGNOSIS — Z1231 Encounter for screening mammogram for malignant neoplasm of breast: Secondary | ICD-10-CM | POA: Insufficient documentation

## 2011-08-04 ENCOUNTER — Other Ambulatory Visit: Payer: Self-pay | Admitting: *Deleted

## 2011-08-04 ENCOUNTER — Other Ambulatory Visit: Payer: Medicare Other

## 2011-08-04 DIAGNOSIS — I10 Essential (primary) hypertension: Secondary | ICD-10-CM

## 2011-08-04 MED ORDER — AMLODIPINE BESYLATE 10 MG PO TABS: 10.0000 mg | ORAL_TABLET | Freq: Every day | ORAL | Status: DC

## 2011-08-05 ENCOUNTER — Other Ambulatory Visit: Payer: Medicare Other

## 2011-08-12 ENCOUNTER — Other Ambulatory Visit: Payer: Medicare Other

## 2011-08-12 DIAGNOSIS — B20 Human immunodeficiency virus [HIV] disease: Secondary | ICD-10-CM

## 2011-08-12 LAB — COMPREHENSIVE METABOLIC PANEL
ALT: 57 U/L — ABNORMAL HIGH (ref 0–35)
AST: 171 U/L — ABNORMAL HIGH (ref 0–37)
Albumin: 2.9 g/dL — ABNORMAL LOW (ref 3.5–5.2)
Alkaline Phosphatase: 59 U/L (ref 39–117)
BUN: 8 mg/dL (ref 6–23)
CO2: 26 mEq/L (ref 19–32)
Calcium: 7.8 mg/dL — ABNORMAL LOW (ref 8.4–10.5)
Chloride: 101 mEq/L (ref 96–112)
Creat: 0.7 mg/dL (ref 0.50–1.10)
Glucose, Bld: 79 mg/dL (ref 70–99)
Potassium: 3.7 mEq/L (ref 3.5–5.3)
Sodium: 130 mEq/L — ABNORMAL LOW (ref 135–145)
Total Bilirubin: 0.7 mg/dL (ref 0.3–1.2)
Total Protein: 11.1 g/dL — ABNORMAL HIGH (ref 6.0–8.3)

## 2011-08-12 LAB — CBC WITH DIFFERENTIAL/PLATELET
Basophils Absolute: 0 10*3/uL (ref 0.0–0.1)
Basophils Relative: 1 % (ref 0–1)
Eosinophils Absolute: 0.2 10*3/uL (ref 0.0–0.7)
Eosinophils Relative: 4 % (ref 0–5)
HCT: 34.3 % — ABNORMAL LOW (ref 36.0–46.0)
Hemoglobin: 11.3 g/dL — ABNORMAL LOW (ref 12.0–15.0)
Lymphocytes Relative: 47 % — ABNORMAL HIGH (ref 12–46)
Lymphs Abs: 1.7 10*3/uL (ref 0.7–4.0)
MCH: 29.1 pg (ref 26.0–34.0)
MCHC: 32.9 g/dL (ref 30.0–36.0)
MCV: 88.4 fL (ref 78.0–100.0)
Monocytes Absolute: 0.3 10*3/uL (ref 0.1–1.0)
Monocytes Relative: 7 % (ref 3–12)
Neutro Abs: 1.5 10*3/uL — ABNORMAL LOW (ref 1.7–7.7)
Neutrophils Relative %: 42 % — ABNORMAL LOW (ref 43–77)
Platelets: 179 10*3/uL (ref 150–400)
RBC: 3.88 MIL/uL (ref 3.87–5.11)
RDW: 15.7 % — ABNORMAL HIGH (ref 11.5–15.5)
WBC: 3.6 10*3/uL — ABNORMAL LOW (ref 4.0–10.5)

## 2011-08-13 LAB — T-HELPER CELL (CD4) - (RCID CLINIC ONLY)
CD4 % Helper T Cell: 16 % — ABNORMAL LOW (ref 33–55)
CD4 T Cell Abs: 320 uL — ABNORMAL LOW (ref 400–2700)

## 2011-08-13 LAB — HIV-1 RNA QUANT-NO REFLEX-BLD
HIV 1 RNA Quant: 314 copies/mL — ABNORMAL HIGH (ref <20)
HIV-1 RNA Quant, Log: 2.5 {Log} — ABNORMAL HIGH (ref <1.30)

## 2011-08-18 ENCOUNTER — Ambulatory Visit: Payer: Medicare Other | Admitting: Adult Health

## 2011-08-19 ENCOUNTER — Ambulatory Visit: Payer: Medicare Other | Admitting: Internal Medicine

## 2011-08-26 ENCOUNTER — Ambulatory Visit (INDEPENDENT_AMBULATORY_CARE_PROVIDER_SITE_OTHER): Payer: Medicare Other | Admitting: Internal Medicine

## 2011-08-26 ENCOUNTER — Encounter: Payer: Self-pay | Admitting: Internal Medicine

## 2011-08-26 VITALS — BP 131/86 | HR 77 | Temp 97.7°F | Wt 148.0 lb

## 2011-08-26 DIAGNOSIS — S32030A Wedge compression fracture of third lumbar vertebra, initial encounter for closed fracture: Secondary | ICD-10-CM

## 2011-08-26 DIAGNOSIS — S32009A Unspecified fracture of unspecified lumbar vertebra, initial encounter for closed fracture: Secondary | ICD-10-CM

## 2011-08-26 DIAGNOSIS — L299 Pruritus, unspecified: Secondary | ICD-10-CM

## 2011-08-26 MED ORDER — HYDROCODONE-IBUPROFEN 7.5-200 MG PO TABS: 1.0000 | ORAL_TABLET | Freq: Three times a day (TID) | ORAL | Status: DC | PRN

## 2011-08-27 ENCOUNTER — Telehealth: Payer: Self-pay | Admitting: *Deleted

## 2011-08-27 NOTE — Telephone Encounter (Signed)
Patient called to see if the provider had made a decision on what cream for her itchy back. Advised she had not put anything in her chart but will ask her and call patient back as soon as she gives me an order. She advised they talked about it at her visit on yesterday.

## 2011-08-28 MED ORDER — HYDROXYZINE HCL 10 MG PO TABS: 10.0000 mg | ORAL_TABLET | Freq: Three times a day (TID) | ORAL | Status: DC | PRN

## 2011-08-28 NOTE — Telephone Encounter (Signed)
States she is still waiting for the med the md told her she would rx. Told her I will send the message to md & call her when done

## 2011-09-01 ENCOUNTER — Other Ambulatory Visit: Payer: Self-pay | Admitting: Licensed Clinical Social Worker

## 2011-09-01 DIAGNOSIS — L299 Pruritus, unspecified: Secondary | ICD-10-CM

## 2011-09-01 MED ORDER — HYDROXYZINE HCL 10 MG PO TABS: 10.0000 mg | ORAL_TABLET | Freq: Three times a day (TID) | ORAL | Status: AC | PRN

## 2011-09-01 NOTE — Telephone Encounter (Signed)
Spoke with provider about this patient and her med request and she advised she will call patient and send in Rx after they talk.

## 2011-09-18 ENCOUNTER — Telehealth: Payer: Self-pay | Admitting: *Deleted

## 2011-09-18 NOTE — Telephone Encounter (Signed)
Patient called to ask if she could get her pain med refilled today and I advised her it is to early and she needs to call for her refill next week on Wednesday.

## 2011-09-24 ENCOUNTER — Telehealth: Payer: Self-pay | Admitting: *Deleted

## 2011-09-24 NOTE — Telephone Encounter (Signed)
Message left reminding the pt to call [facility] clinic for a f/u appt.  Phone number given.

## 2011-09-25 ENCOUNTER — Other Ambulatory Visit: Payer: Self-pay | Admitting: *Deleted

## 2011-09-25 DIAGNOSIS — G479 Sleep disorder, unspecified: Secondary | ICD-10-CM

## 2011-09-25 MED ORDER — ZOLPIDEM TARTRATE 10 MG PO TABS: 10.0000 mg | ORAL_TABLET | Freq: Every evening | ORAL | Status: DC | PRN

## 2011-09-29 ENCOUNTER — Other Ambulatory Visit: Payer: Self-pay | Admitting: *Deleted

## 2011-09-29 DIAGNOSIS — G479 Sleep disorder, unspecified: Secondary | ICD-10-CM

## 2011-09-29 DIAGNOSIS — S32030A Wedge compression fracture of third lumbar vertebra, initial encounter for closed fracture: Secondary | ICD-10-CM

## 2011-09-29 MED ORDER — HYDROCODONE-IBUPROFEN 7.5-200 MG PO TABS: 1.0000 | ORAL_TABLET | Freq: Three times a day (TID) | ORAL | Status: DC | PRN

## 2011-09-29 MED ORDER — ZOLPIDEM TARTRATE 10 MG PO TABS: 10.0000 mg | ORAL_TABLET | Freq: Every evening | ORAL | Status: DC | PRN

## 2011-09-29 NOTE — Telephone Encounter (Signed)
I notified pt via voice mail that refills have been called in.

## 2011-10-07 ENCOUNTER — Encounter: Payer: Self-pay | Admitting: Internal Medicine

## 2011-10-07 ENCOUNTER — Ambulatory Visit (INDEPENDENT_AMBULATORY_CARE_PROVIDER_SITE_OTHER): Payer: Medicare Other | Admitting: Internal Medicine

## 2011-10-07 VITALS — BP 122/83 | HR 77 | Temp 98.3°F | Wt 152.0 lb

## 2011-10-07 DIAGNOSIS — B20 Human immunodeficiency virus [HIV] disease: Secondary | ICD-10-CM

## 2011-10-07 DIAGNOSIS — Z21 Asymptomatic human immunodeficiency virus [HIV] infection status: Secondary | ICD-10-CM

## 2011-10-08 LAB — HIV-1 RNA QUANT-NO REFLEX-BLD
HIV 1 RNA Quant: <20 copies/mL (ref <20)
HIV-1 RNA Quant, Log: <1.3 {Log} (ref <1.30)

## 2011-11-06 ENCOUNTER — Other Ambulatory Visit: Payer: Self-pay | Admitting: *Deleted

## 2011-11-06 DIAGNOSIS — S32030A Wedge compression fracture of third lumbar vertebra, initial encounter for closed fracture: Secondary | ICD-10-CM

## 2011-11-06 MED ORDER — HYDROCODONE-IBUPROFEN 7.5-200 MG PO TABS: 1.0000 | ORAL_TABLET | Freq: Three times a day (TID) | ORAL | Status: DC | PRN

## 2011-12-04 ENCOUNTER — Telehealth: Payer: Self-pay | Admitting: *Deleted

## 2011-12-04 ENCOUNTER — Telehealth: Payer: Self-pay | Admitting: Internal Medicine

## 2011-12-04 NOTE — Telephone Encounter (Signed)
Called patient at providers request to advise that the Rx is called in and will not be available for pick up until 12/06/11. Patient acknowledged understanding of this.

## 2011-12-04 NOTE — Telephone Encounter (Signed)
Called regarding low back pain due to spontaneous fracture from osteoporosis. Will give refill on hydrocodone-ibuprofen 1 tab Q 8hr PRN pain #90, 3 refills, called into [facility]. They know that she can't pick up prescription earlier than the 20th.  Will evaluate if she can benefit from vertebroplasty.  At next visit, have her sign pain contract

## 2011-12-04 NOTE — Telephone Encounter (Signed)
Patient requesting refill on Vicodin, last filled 11/06/11.  Do not see what was decided on her visit in May regarding her back pain. Sent to MD Wendall Mola CMA

## 2011-12-29 ENCOUNTER — Other Ambulatory Visit: Payer: Medicare Other

## 2011-12-31 ENCOUNTER — Other Ambulatory Visit: Payer: Self-pay | Admitting: Internal Medicine

## 2011-12-31 DIAGNOSIS — Z79899 Other long term (current) drug therapy: Secondary | ICD-10-CM

## 2011-12-31 DIAGNOSIS — Z113 Encounter for screening for infections with a predominantly sexual mode of transmission: Secondary | ICD-10-CM

## 2011-12-31 DIAGNOSIS — B20 Human immunodeficiency virus [HIV] disease: Secondary | ICD-10-CM

## 2012-01-05 ENCOUNTER — Other Ambulatory Visit: Payer: Medicare Other

## 2012-01-05 DIAGNOSIS — B20 Human immunodeficiency virus [HIV] disease: Secondary | ICD-10-CM

## 2012-01-05 DIAGNOSIS — Z113 Encounter for screening for infections with a predominantly sexual mode of transmission: Secondary | ICD-10-CM

## 2012-01-05 DIAGNOSIS — Z79899 Other long term (current) drug therapy: Secondary | ICD-10-CM

## 2012-01-05 LAB — CBC WITH DIFFERENTIAL/PLATELET
Basophils Absolute: 0 10*3/uL (ref 0.0–0.1)
Basophils Relative: 0 % (ref 0–1)
Eosinophils Absolute: 0.1 10*3/uL (ref 0.0–0.7)
Eosinophils Relative: 1 % (ref 0–5)
HCT: 35 % — ABNORMAL LOW (ref 36.0–46.0)
Hemoglobin: 11.7 g/dL — ABNORMAL LOW (ref 12.0–15.0)
Lymphocytes Relative: 30 % (ref 12–46)
Lymphs Abs: 1.3 10*3/uL (ref 0.7–4.0)
MCH: 29.4 pg (ref 26.0–34.0)
MCHC: 33.4 g/dL (ref 30.0–36.0)
MCV: 87.9 fL (ref 78.0–100.0)
Monocytes Absolute: 0.2 10*3/uL (ref 0.1–1.0)
Monocytes Relative: 5 % (ref 3–12)
Neutro Abs: 2.8 10*3/uL (ref 1.7–7.7)
Neutrophils Relative %: 64 % (ref 43–77)
Platelets: 220 10*3/uL (ref 150–400)
RBC: 3.98 MIL/uL (ref 3.87–5.11)
RDW: 14.4 % (ref 11.5–15.5)
WBC: 4.3 10*3/uL (ref 4.0–10.5)

## 2012-01-06 LAB — COMPREHENSIVE METABOLIC PANEL
ALT: 10 U/L (ref 0–35)
AST: 25 U/L (ref 0–37)
Albumin: 3.3 g/dL — ABNORMAL LOW (ref 3.5–5.2)
Alkaline Phosphatase: 84 U/L (ref 39–117)
BUN: 15 mg/dL (ref 6–23)
CO2: 29 mEq/L (ref 19–32)
Calcium: 9.4 mg/dL (ref 8.4–10.5)
Chloride: 104 mEq/L (ref 96–112)
Creat: 0.79 mg/dL (ref 0.50–1.10)
Glucose, Bld: 104 mg/dL — ABNORMAL HIGH (ref 70–99)
Potassium: 4.1 mEq/L (ref 3.5–5.3)
Sodium: 135 mEq/L (ref 135–145)
Total Bilirubin: 0.6 mg/dL (ref 0.3–1.2)
Total Protein: 10 g/dL — ABNORMAL HIGH (ref 6.0–8.3)

## 2012-01-06 LAB — RPR

## 2012-01-06 LAB — T-HELPER CELL (CD4) - (RCID CLINIC ONLY)
CD4 % Helper T Cell: 23 % — ABNORMAL LOW (ref 33–55)
CD4 T Cell Abs: 290 uL — ABNORMAL LOW (ref 400–2700)

## 2012-01-06 LAB — HIV-1 RNA QUANT-NO REFLEX-BLD
HIV 1 RNA Quant: <20 copies/mL (ref <20)
HIV-1 RNA Quant, Log: <1.3 {Log} (ref <1.30)

## 2012-01-06 LAB — LIPID PANEL
Cholesterol: 129 mg/dL (ref 0–200)
HDL: 47 mg/dL (ref >39)
LDL Cholesterol: 63 mg/dL (ref 0–99)
Total CHOL/HDL Ratio: 2.7 Ratio
Triglycerides: 97 mg/dL (ref <150)
VLDL: 19 mg/dL (ref 0–40)

## 2012-01-12 ENCOUNTER — Ambulatory Visit: Payer: Medicare Other | Admitting: Internal Medicine

## 2012-01-20 ENCOUNTER — Ambulatory Visit (INDEPENDENT_AMBULATORY_CARE_PROVIDER_SITE_OTHER): Payer: Medicare Other | Admitting: Internal Medicine

## 2012-01-20 ENCOUNTER — Encounter: Payer: Self-pay | Admitting: Internal Medicine

## 2012-01-20 VITALS — BP 132/86 | HR 71 | Temp 97.9°F | Wt 155.0 lb

## 2012-01-20 DIAGNOSIS — M810 Age-related osteoporosis without current pathological fracture: Secondary | ICD-10-CM

## 2012-01-20 DIAGNOSIS — D649 Anemia, unspecified: Secondary | ICD-10-CM

## 2012-01-20 LAB — VITAMIN B12: Vitamin B-12: 579 pg/mL (ref 211–911)

## 2012-01-20 MED ORDER — CALCIUM CARBONATE-VITAMIN D 600-400 MG-UNIT PO TABS: 1.0000 | ORAL_TABLET | Freq: Two times a day (BID) | ORAL | Status: DC

## 2012-01-20 NOTE — Progress Notes (Signed)
HIV CLINIC NOTE  RFV: routine follow up Subjective:    Patient ID: Vanessa Beard, female    DOB: 1950-06-24, 61 y.o.   MRN: [id number]  HPI HIV, Cd 4 count 290/VL <20, currently on raltegravir /rilpivirine/emtriva, not missed any doses. Still has a bit of insomnia, take ambien 3 nights/wk.she has pathological fracture -> on forteo injections for osteoporosis but not taking calcium-vit d supplementation. Pain is treated with vicodin 7.5/200 takes 1 tab Q6-8hrs. Otherwise, she is doing good. "young mind, old body"  Needs a note for April 2011 for school  Current Outpatient Prescriptions on File Prior to Visit  Medication Sig Dispense Refill  . amLODipine (NORVASC) 10 MG tablet Take 1 tablet (10 mg total) by mouth daily.  90 tablet  3  . emtricitabine (EMTRIVA) 200 MG capsule Take 200 mg by mouth daily.      Marland Kitchen HYDROcodone-ibuprofen (VICOPROFEN) 7.5-200 MG per tablet Take 1 tablet by mouth every 8 (eight) hours as needed for pain.  90 tablet  0  . raltegravir (ISENTRESS) 400 MG tablet Take 1 tablet (400 mg total) by mouth 2 (two) times daily.  60 tablet  5  . rilpivirine (ENDURANT) 25 MG TABS tablet Take 1 tablet (25 mg total) by mouth daily. Take with a 400-600-calorie meal.  30 tablet  5  . zolpidem (AMBIEN) 10 MG tablet Take 1 tablet (10 mg total) by mouth at bedtime as needed.  30 tablet  2      Review of Systems  Constitutional: Negative for fever, chills, diaphoresis, activity change, appetite change, fatigue and unexpected weight change.  HENT: Negative for congestion, sore throat, rhinorrhea, sneezing, trouble swallowing and sinus pressure.  Eyes: Negative for photophobia and visual disturbance.  Respiratory: Negative for cough, chest tightness, shortness of breath, wheezing and stridor.  Cardiovascular: Negative for chest pain, palpitations and leg swelling.  Gastrointestinal: Negative for nausea, vomiting, abdominal pain, diarrhea, constipation, blood in stool, abdominal  distention and anal bleeding.  Genitourinary: Negative for dysuria, hematuria, flank pain and difficulty urinating.  Musculoskeletal: back pain per hpi Skin: Negative for color change, pallor, rash and wound.  Neurological: Negative for dizziness, tremors, weakness and light-headedness.  Hematological: Negative for adenopathy. Does not bruise/bleed easily.  Psychiatric/Behavioral: Negative for behavioral problems, confusion, sleep disturbance, dysphoric mood, decreased concentration and agitation.       Objective:   Physical Exam BP 132/86  Pulse 71  Temp 97.9 F (36.6 C) (Oral)  Wt 155 lb (70.308 kg)   General Appearance:    Alert, cooperative, no distress, appears stated age  Head:    Normocephalic, without obvious abnormality, atraumatic  Eyes:    PERRL, conjunctiva/corneas clear, EOM's intact,   Ears:    Normal TM's and external ear canals, both ears  Nose:   Nares normal, septum midline, mucosa normal, no drainage    or sinus tenderness  Throat:   Lips, mucosa, and tongue normal; teeth and gums normal  Neck:   Supple, symmetrical, trachea midline, no adenopathy;     Back:     Symmetric, no curvature, ROM normal, no CVA tenderness  Lungs:     Clear to auscultation bilaterally, respirations unlabored      Heart:    Regular rate and rhythm, S1 and S2 normal, no murmur, rub   or gallop     Abdomen:     Soft, non-tender, bowel sounds active all four quadrants,    no masses, no organomegaly  Extremities:   Extremities normal, atraumatic, no cyanosis or edema  Pulses:   2+ and symmetric all extremities  Skin:   Skin color, texture, turgor normal, no rashes or lesions  Lymph nodes:   Cervical, supraclavicular, and axillary nodes normal            Assessment & Plan:   HIV = continue with current regimen  Health maintenance= get flu vax next month  Cancer screening = 1st will focus on mammo screening and then plan getting colonoscopy.  Vitamin b12 deficiency =  check level to see if needs supplementation. She reports a hx of vit b12 def.  Pain management = will do refills on monthly basis/get pain contract.  Getting back to school/financial aid = needs a letter of support to get financial aid back from [facility]  rtc 3months

## 2012-01-23 ENCOUNTER — Other Ambulatory Visit: Payer: Self-pay | Admitting: *Deleted

## 2012-01-23 DIAGNOSIS — G479 Sleep disorder, unspecified: Secondary | ICD-10-CM

## 2012-01-23 MED ORDER — ZOLPIDEM TARTRATE 10 MG PO TABS: 10.0000 mg | ORAL_TABLET | Freq: Every evening | ORAL | Status: DC | PRN

## 2012-01-27 ENCOUNTER — Telehealth: Payer: Self-pay | Admitting: *Deleted

## 2012-01-27 NOTE — Telephone Encounter (Signed)
Called patient to inform her that the letter she requested is ready to pick up and it will be up front for her.

## 2012-02-03 ENCOUNTER — Other Ambulatory Visit: Payer: Self-pay | Admitting: *Deleted

## 2012-02-03 DIAGNOSIS — S32030A Wedge compression fracture of third lumbar vertebra, initial encounter for closed fracture: Secondary | ICD-10-CM

## 2012-02-03 MED ORDER — HYDROCODONE-IBUPROFEN 7.5-200 MG PO TABS: 1.0000 | ORAL_TABLET | Freq: Three times a day (TID) | ORAL | Status: DC | PRN

## 2012-02-23 NOTE — Progress Notes (Signed)
HIV CLINIC  RFV: routine follow up Subjective:    Patient ID: Vanessa Beard, female    DOB: 13-Feb-1951, 61 y.o.   MRN: [id number]  HPI Vanessa Beard is a 61yo F with HIV, CD 4 count of 320(16%)/VL 314 (down from 24,800) currently on raltegravir, emtricitabine, and rilpivirine. Last seen in April for compression fx, and February for routine care. She states that she is taking her HIV medications regularly. Not missing doses.  Current Outpatient Prescriptions on File Prior to Visit  Medication Sig Dispense Refill  . amLODipine (NORVASC) 10 MG tablet Take 1 tablet (10 mg total) by mouth daily.  90 tablet  3  . emtricitabine (EMTRIVA) 200 MG capsule Take 200 mg by mouth daily.      . raltegravir (ISENTRESS) 400 MG tablet Take 1 tablet (400 mg total) by mouth 2 (two) times daily.  60 tablet  5  . rilpivirine (ENDURANT) 25 MG TABS tablet Take 1 tablet (25 mg total) by mouth daily. Take with a 400-600-calorie meal.  30 tablet  5  . Calcium Carbonate-Vitamin D (CALCIUM 600-D) 600-400 MG-UNIT per tablet Take 1 tablet by mouth 2 (two) times daily.  100 tablet  3  . zolpidem (AMBIEN) 10 MG tablet Take 1 tablet (10 mg total) by mouth at bedtime as needed.  30 tablet  0   Active Ambulatory Problems    Diagnosis Date Noted  . Human Immunodeficiency Virus (HIV) Disease 08/04/2006  . HEPATITIS C, CHRONIC 12/19/2008  . PNEUMOCYSTIS PNEUMONIA 05/07/2007  . HYPERTENSION 05/07/2007  . ALLERGIC RHINITIS 07/09/2007  . DENTAL CARIES 05/07/2007  . LEUKORRHEA 04/07/2008  . BACK PAIN, LUMBAR 11/10/2007  . INSOMNIA 05/07/2007  . FATIGUE 04/07/2008  . COUGH 07/18/2009  . PAP SMEAR, LGSIL, ABNORMAL 12/21/2009  . PNEUMONIA, HX OF 05/07/2007  . HERPES ZOSTER, HX OF 05/07/2007  . Osteopenia 02/03/2011  . Hyperparathyroidism 02/10/2011  . Sore throat 04/02/2011  . Compression fracture of L3 lumbar vertebra 07/01/2011  . Thyroid dysfunction 07/01/2011  . Anemia 07/01/2011   Resolved Ambulatory Problems   Diagnosis Date Noted  . No Resolved Ambulatory Problems   Past Medical History  Diagnosis Date  . HIV infection   . Osteoporosis      Review of Systems 10 point ROS is negative with exception to low back pain    Objective:   Physical Exam BP 122/83  Pulse 77  Temp 98.3 F (36.8 C) (Oral)  Wt 152 lb (68.947 kg) Physical Exam  Constitutional:  oriented to person, place, and time.  appears well-developed and well-nourished. No distress.  HENT:  Mouth/Throat: Oropharynx is clear and moist. No oropharyngeal exudate.  Cardiovascular: Normal rate, regular rhythm and normal heart sounds. Exam reveals no gallop and no friction rub.  No murmur heard.  Pulmonary/Chest: Effort normal and breath sounds normal. No respiratory distress.  no wheezes.  Abdominal: Soft. Bowel sounds are normal.  exhibits no distension. There is no tenderness.  Lymphadenopathy: no  cervical adenopathy.  Neurological:  alert and oriented to person, place, and time.  Skin: Skin is warm and dry. No rash noted. No erythema.       Assessment & Plan:  HIV with viremia= concern that she may have resistance on her current regimen, vs. Non-adherence. Will check Viral load, genotype, HLA [id number] to see if need to change her regimen.  Osteoporosis= she is getting forteo injection, but will also need calcium and vit D+ supplementation.  Back pain from compression fracture = will give hydrocodone 7.5mg   TID PRN pain.  Health maintenance = will get mammo and pap in the coming year. Flu vax in the fall.  rtc in 3 months

## 2012-02-23 NOTE — Progress Notes (Signed)
HIV CLINIC NOTE  RFV: back pain adn routine visit Subjective:    Patient ID: Vanessa Beard, female    DOB: 04/05/1951, 61 y.o.   MRN: [id number]  HPI Mrs. Dampier is a 61yo F with HIV, CD 4 count of 320/ VL 324 (down from 24,800) currently on raltegravir/rilpivirine/emtricitabine.taking medications regularly. Recalls taking some medications late but not missing doses. Taking pain meds for back pain associated with compression fracture.  Current Outpatient Prescriptions on File Prior to Visit  Medication Sig Dispense Refill  . amLODipine (NORVASC) 10 MG tablet Take 1 tablet (10 mg total) by mouth daily.  90 tablet  3  . raltegravir (ISENTRESS) 400 MG tablet Take 1 tablet (400 mg total) by mouth 2 (two) times daily.  60 tablet  5  . rilpivirine (ENDURANT) 25 MG TABS tablet Take 1 tablet (25 mg total) by mouth daily. Take with a 400-600-calorie meal.  30 tablet  5  . Calcium Carbonate-Vitamin D (CALCIUM 600-D) 600-400 MG-UNIT per tablet Take 1 tablet by mouth 2 (two) times daily.  100 tablet  3  . zolpidem (AMBIEN) 10 MG tablet Take 1 tablet (10 mg total) by mouth at bedtime as needed.  30 tablet  0   Active Ambulatory Problems    Diagnosis Date Noted  . Human Immunodeficiency Virus (HIV) Disease 08/04/2006  . HEPATITIS C, CHRONIC 12/19/2008  . PNEUMOCYSTIS PNEUMONIA 05/07/2007  . HYPERTENSION 05/07/2007  . ALLERGIC RHINITIS 07/09/2007  . DENTAL CARIES 05/07/2007  . LEUKORRHEA 04/07/2008  . BACK PAIN, LUMBAR 11/10/2007  . INSOMNIA 05/07/2007  . FATIGUE 04/07/2008  . COUGH 07/18/2009  . PAP SMEAR, LGSIL, ABNORMAL 12/21/2009  . PNEUMONIA, HX OF 05/07/2007  . HERPES ZOSTER, HX OF 05/07/2007  . Osteopenia 02/03/2011  . Hyperparathyroidism 02/10/2011  . Sore throat 04/02/2011  . Compression fracture of L3 lumbar vertebra 07/01/2011  . Thyroid dysfunction 07/01/2011  . Anemia 07/01/2011   Resolved Ambulatory Problems    Diagnosis Date Noted  . No Resolved Ambulatory Problems   Past  Medical History  Diagnosis Date  . HIV infection   . Osteoporosis        Review of Systems 10 point ROS otherwise negative    Objective:   Physical Exam BP 131/86  Pulse 77  Temp 97.7 F (36.5 C) (Oral)  Wt 148 lb (67.132 kg) Physical Exam  Constitutional:  oriented to person, place, and time.  appears well-developed and well-nourished. No distress.  HENT: Hall[place]/AT, PERRLA, EOMI, no scleral icterus Mouth/Throat: Oropharynx is clear and moist. No oropharyngeal exudate.  Cardiovascular: Normal rate, regular rhythm and normal heart sounds. Exam reveals no gallop and no friction rub.  No murmur heard.  Pulmonary/Chest: Effort normal and breath sounds normal. No respiratory distress.  no wheezes.  Lymphadenopathy:  no cervical adenopathy.  Skin: Skin is warm and dry. No rash noted. No erythema.       Assessment & Plan:  HIV = continue with current regimen, will have her come back to clinic in 4-6 wks. If still have elevated viremia, will need to check genotype and [id number] to see if need to change her regimen.  Osteoporosis= managed by PCP to get forteo injections.  Health maintenance = had mammo (-) in march 2013, next due in spring of 2014.

## 2012-02-26 ENCOUNTER — Other Ambulatory Visit: Payer: Self-pay | Admitting: Licensed Clinical Social Worker

## 2012-02-26 DIAGNOSIS — G479 Sleep disorder, unspecified: Secondary | ICD-10-CM

## 2012-02-26 MED ORDER — ZOLPIDEM TARTRATE 10 MG PO TABS: 10.0000 mg | ORAL_TABLET | Freq: Every evening | ORAL | Status: DC | PRN

## 2012-03-01 ENCOUNTER — Other Ambulatory Visit: Payer: Self-pay | Admitting: *Deleted

## 2012-03-01 DIAGNOSIS — S32030A Wedge compression fracture of third lumbar vertebra, initial encounter for closed fracture: Secondary | ICD-10-CM

## 2012-03-01 MED ORDER — HYDROCODONE-IBUPROFEN 7.5-200 MG PO TABS: 1.0000 | ORAL_TABLET | Freq: Three times a day (TID) | ORAL | Status: DC | PRN

## 2012-03-30 ENCOUNTER — Other Ambulatory Visit: Payer: Self-pay | Admitting: Licensed Clinical Social Worker

## 2012-03-30 DIAGNOSIS — G479 Sleep disorder, unspecified: Secondary | ICD-10-CM

## 2012-03-30 DIAGNOSIS — S32030A Wedge compression fracture of third lumbar vertebra, initial encounter for closed fracture: Secondary | ICD-10-CM

## 2012-03-30 MED ORDER — ZOLPIDEM TARTRATE 10 MG PO TABS: 10.0000 mg | ORAL_TABLET | Freq: Every evening | ORAL | Status: DC | PRN

## 2012-03-30 MED ORDER — HYDROCODONE-IBUPROFEN 7.5-200 MG PO TABS: 1.0000 | ORAL_TABLET | Freq: Three times a day (TID) | ORAL | Status: DC | PRN

## 2012-04-01 ENCOUNTER — Telehealth: Payer: Self-pay | Admitting: *Deleted

## 2012-04-01 NOTE — Telephone Encounter (Signed)
Annual PAP smear appt made for 04/23/12 @ 2:30 PM

## 2012-04-06 ENCOUNTER — Other Ambulatory Visit: Payer: Medicare Other

## 2012-04-09 ENCOUNTER — Other Ambulatory Visit: Payer: Medicare Other

## 2012-04-12 ENCOUNTER — Other Ambulatory Visit (INDEPENDENT_AMBULATORY_CARE_PROVIDER_SITE_OTHER): Payer: Medicare Other

## 2012-04-12 DIAGNOSIS — B20 Human immunodeficiency virus [HIV] disease: Secondary | ICD-10-CM

## 2012-04-12 DIAGNOSIS — Z79899 Other long term (current) drug therapy: Secondary | ICD-10-CM

## 2012-04-12 LAB — CBC WITH DIFFERENTIAL/PLATELET
Basophils Absolute: 0 10*3/uL (ref 0.0–0.1)
Basophils Relative: 0 % (ref 0–1)
Eosinophils Absolute: 0 10*3/uL (ref 0.0–0.7)
Eosinophils Relative: 1 % (ref 0–5)
HCT: 33.4 % — ABNORMAL LOW (ref 36.0–46.0)
Hemoglobin: 11.8 g/dL — ABNORMAL LOW (ref 12.0–15.0)
Lymphocytes Relative: 27 % (ref 12–46)
Lymphs Abs: 0.8 10*3/uL (ref 0.7–4.0)
MCH: 30.3 pg (ref 26.0–34.0)
MCHC: 35.3 g/dL (ref 30.0–36.0)
MCV: 85.9 fL (ref 78.0–100.0)
Monocytes Absolute: 0.2 10*3/uL (ref 0.1–1.0)
Monocytes Relative: 6 % (ref 3–12)
Neutro Abs: 2 10*3/uL (ref 1.7–7.7)
Neutrophils Relative %: 66 % (ref 43–77)
Platelets: 175 10*3/uL (ref 150–400)
RBC: 3.89 MIL/uL (ref 3.87–5.11)
RDW: 13 % (ref 11.5–15.5)
WBC: 3.1 10*3/uL — ABNORMAL LOW (ref 4.0–10.5)

## 2012-04-12 LAB — COMPREHENSIVE METABOLIC PANEL
ALT: 11 U/L (ref 0–35)
AST: 25 U/L (ref 0–37)
Albumin: 3.7 g/dL (ref 3.5–5.2)
Alkaline Phosphatase: 108 U/L (ref 39–117)
BUN: 12 mg/dL (ref 6–23)
CO2: 29 mEq/L (ref 19–32)
Calcium: 8.9 mg/dL (ref 8.4–10.5)
Chloride: 103 mEq/L (ref 96–112)
Creat: 0.78 mg/dL (ref 0.50–1.10)
Glucose, Bld: 82 mg/dL (ref 70–99)
Potassium: 3.6 mEq/L (ref 3.5–5.3)
Sodium: 136 mEq/L (ref 135–145)
Total Bilirubin: 0.7 mg/dL (ref 0.3–1.2)
Total Protein: 8.8 g/dL — ABNORMAL HIGH (ref 6.0–8.3)

## 2012-04-12 LAB — LIPID PANEL
Cholesterol: 121 mg/dL (ref 0–200)
HDL: 48 mg/dL (ref >39)
LDL Cholesterol: 61 mg/dL (ref 0–99)
Total CHOL/HDL Ratio: 2.5 Ratio
Triglycerides: 59 mg/dL (ref <150)
VLDL: 12 mg/dL (ref 0–40)

## 2012-04-13 LAB — HIV-1 RNA QUANT-NO REFLEX-BLD
HIV 1 RNA Quant: <20 copies/mL (ref <20)
HIV-1 RNA Quant, Log: <1.3 {Log} (ref <1.30)

## 2012-04-13 LAB — T-HELPER CELL (CD4) - (RCID CLINIC ONLY)
CD4 % Helper T Cell: 22 % — ABNORMAL LOW (ref 33–55)
CD4 T Cell Abs: 200 uL — ABNORMAL LOW (ref 400–2700)

## 2012-04-20 ENCOUNTER — Telehealth: Payer: Self-pay | Admitting: *Deleted

## 2012-04-20 ENCOUNTER — Ambulatory Visit: Payer: Medicare Other | Admitting: Internal Medicine

## 2012-04-20 NOTE — Telephone Encounter (Signed)
Called patient to reschedule her appointment for this afternoon. She was told that this appointment had already been rescheduled as her labs might not be resulted in time.   Vanessa Beard

## 2012-04-23 ENCOUNTER — Ambulatory Visit: Payer: Medicare Other

## 2012-04-27 ENCOUNTER — Ambulatory Visit (INDEPENDENT_AMBULATORY_CARE_PROVIDER_SITE_OTHER): Payer: Medicare Other | Admitting: Internal Medicine

## 2012-04-27 ENCOUNTER — Encounter: Payer: Self-pay | Admitting: Internal Medicine

## 2012-04-27 VITALS — BP 137/92 | HR 66 | Temp 97.4°F | Ht 60.0 in | Wt 161.5 lb

## 2012-04-27 DIAGNOSIS — S32009A Unspecified fracture of unspecified lumbar vertebra, initial encounter for closed fracture: Secondary | ICD-10-CM

## 2012-04-27 DIAGNOSIS — X58XXXA Exposure to other specified factors, initial encounter: Secondary | ICD-10-CM

## 2012-04-27 DIAGNOSIS — Z21 Asymptomatic human immunodeficiency virus [HIV] infection status: Secondary | ICD-10-CM

## 2012-04-27 DIAGNOSIS — S32030A Wedge compression fracture of third lumbar vertebra, initial encounter for closed fracture: Secondary | ICD-10-CM

## 2012-04-27 MED ORDER — HYDROCODONE-IBUPROFEN 7.5-200 MG PO TABS: 1.0000 | ORAL_TABLET | Freq: Three times a day (TID) | ORAL | Status: DC | PRN

## 2012-04-27 NOTE — Progress Notes (Signed)
HIV CLINIC NOTE  RFV: routine HIV visit Subjective:    Patient ID: Vanessa Beard, female    DOB: 08/10/1950, 61 y.o.   MRN: [id number]  HPI HIV/HCV with CD 4 count 200/ VL < 20 (nov 2013) on raltegravir /rilpivirine/emtriva. Overall doing well with exception to back pain. She states that she has been taking OTC but has not had much relief.no weakness no loss of bowel or bladder function. Low back pain throbbing in nature. No fever,chills, nightsweats.   Current Outpatient Prescriptions on File Prior to Visit  Medication Sig Dispense Refill  . amLODipine (NORVASC) 10 MG tablet Take 1 tablet (10 mg total) by mouth daily.  90 tablet  3  . Calcium Carbonate-Vitamin D (CALCIUM 600-D) 600-400 MG-UNIT per tablet Take 1 tablet by mouth 2 (two) times daily.  100 tablet  3  . emtricitabine (EMTRIVA) 200 MG capsule Take 200 mg by mouth daily.      Marland Kitchen HYDROcodone-ibuprofen (VICOPROFEN) 7.5-200 MG per tablet Take 1 tablet by mouth every 8 (eight) hours as needed for pain.  90 tablet  0  . raltegravir (ISENTRESS) 400 MG tablet Take 1 tablet (400 mg total) by mouth 2 (two) times daily.  60 tablet  5  . rilpivirine (ENDURANT) 25 MG TABS tablet Take 1 tablet (25 mg total) by mouth daily. Take with a 400-600-calorie meal.  30 tablet  5  . zolpidem (AMBIEN) 10 MG tablet Take 1 tablet (10 mg total) by mouth at bedtime as needed.  30 tablet  0    Review of Systems Back pain per hpi. Otherwise 10 point ROS negative    Objective:   Physical Exam BP 137/92  Pulse 66  Temp 97.4 F (36.3 C) (Oral)  Ht 5' (1.524 m)  Wt 161 lb 8 oz (73.256 kg)  BMI 31.54 kg/m2 Physical Exam  Constitutional:  oriented to person, place, and time. appears well-developed and well-nourished. No distress.  HENT:  Mouth/Throat: Oropharynx is clear and moist. No oropharyngeal exudate.  Cardiovascular: Normal rate, regular rhythm and normal heart sounds. Exam reveals no gallop and no friction rub.  No murmur heard.   Pulmonary/Chest: Effort normal and breath sounds normal. No respiratory distress. He has no wheezes.  Abdominal: Soft. Bowel sounds are normal. exhibits no distension. There is no tenderness.  Lymphadenopathy:  no cervical adenopathy.  Skin: Skin is warm and dry. No rash noted. No erythema.      Assessment & Plan:  hiv = continue on current regimen  Back pain from L3 lumbar verterbral compression fracture = will refill her pain medicaiton, but in the meantime refer to PM& R and ortho for kyphoplasty eval  Health maintenance = will give flu vaccine today  rtc in 3 months

## 2012-05-10 ENCOUNTER — Ambulatory Visit (INDEPENDENT_AMBULATORY_CARE_PROVIDER_SITE_OTHER): Payer: Medicare Other | Admitting: *Deleted

## 2012-05-10 DIAGNOSIS — Z0000 Encounter for general adult medical examination without abnormal findings: Secondary | ICD-10-CM

## 2012-05-10 DIAGNOSIS — Z124 Encounter for screening for malignant neoplasm of cervix: Secondary | ICD-10-CM

## 2012-05-10 NOTE — Patient Instructions (Addendum)
Your results will be ready in about a week.  I will mail them to you.  Have a safe and Happy Holiday Season.  Thank you for coming to the Center for your care.  Angelique Blonder, RN

## 2012-05-10 NOTE — Progress Notes (Signed)
  Subjective:     Vanessa Beard is a 61 y.o. woman who comes in today for a  pap smear only.  Pt complained of occasional Tercero vaginal discharge.  Pt experienced pain when speculum placed and during acquiring sample.   Objective:    There were no vitals taken for this visit. Creamy, Cordone, vaginal discharge.  Vagina with erythematous spots.  Pelvic Exam: Pap smear obtained.   Assessment:    Screening pap smear.   Plan:    Follow up in year, or as indicated by Pap results.  Pt given condoms.  Pt given educational materials re:  HIV and women, PAP smears, BSE, partner protection, ACA and how to sign up and HIV and heart disease.

## 2012-05-14 ENCOUNTER — Other Ambulatory Visit: Payer: Self-pay | Admitting: *Deleted

## 2012-05-14 DIAGNOSIS — G479 Sleep disorder, unspecified: Secondary | ICD-10-CM

## 2012-05-14 MED ORDER — ZOLPIDEM TARTRATE 10 MG PO TABS: 10.0000 mg | ORAL_TABLET | Freq: Every evening | ORAL | Status: DC | PRN

## 2012-05-21 ENCOUNTER — Encounter: Payer: Self-pay | Admitting: *Deleted

## 2012-05-27 ENCOUNTER — Other Ambulatory Visit: Payer: Self-pay | Admitting: *Deleted

## 2012-05-27 DIAGNOSIS — S32030A Wedge compression fracture of third lumbar vertebra, initial encounter for closed fracture: Secondary | ICD-10-CM

## 2012-05-27 MED ORDER — HYDROCODONE-IBUPROFEN 7.5-200 MG PO TABS: 1.0000 | ORAL_TABLET | Freq: Three times a day (TID) | ORAL | Status: DC | PRN

## 2012-06-03 ENCOUNTER — Other Ambulatory Visit: Payer: Self-pay | Admitting: *Deleted

## 2012-06-03 DIAGNOSIS — B20 Human immunodeficiency virus [HIV] disease: Secondary | ICD-10-CM

## 2012-06-03 MED ORDER — RALTEGRAVIR POTASSIUM 400 MG PO TABS: 400.0000 mg | ORAL_TABLET | Freq: Two times a day (BID) | ORAL | Status: DC

## 2012-06-03 MED ORDER — EMTRICITABINE 200 MG PO CAPS: 200.0000 mg | ORAL_CAPSULE | Freq: Every day | ORAL | Status: DC

## 2012-06-03 MED ORDER — RILPIVIRINE HCL 25 MG PO TABS: 25.0000 mg | ORAL_TABLET | Freq: Every day | ORAL | Status: DC

## 2012-06-17 ENCOUNTER — Telehealth: Payer: Self-pay | Admitting: Internal Medicine

## 2012-06-17 ENCOUNTER — Telehealth: Payer: Self-pay | Admitting: *Deleted

## 2012-06-17 NOTE — Telephone Encounter (Signed)
Pt requesting Ambien rx w/ refills.  MD please advise and # of refills.

## 2012-06-17 NOTE — Telephone Encounter (Signed)
C/D 06/17/12 for appt. 07/07/12

## 2012-06-17 NOTE — Telephone Encounter (Signed)
S/W PT IN REF TO NP APPT. ON 07/07/12@1 :30 REFERRING DR Sharl Ma DX-MONOCLONAL GAMMOPATHY MAILED NP APPT.

## 2012-06-18 ENCOUNTER — Other Ambulatory Visit: Payer: Self-pay | Admitting: Licensed Clinical Social Worker

## 2012-06-18 DIAGNOSIS — G479 Sleep disorder, unspecified: Secondary | ICD-10-CM

## 2012-06-18 MED ORDER — ZOLPIDEM TARTRATE 10 MG PO TABS: 10.0000 mg | ORAL_TABLET | Freq: Every evening | ORAL | Status: DC | PRN

## 2012-06-23 ENCOUNTER — Encounter (HOSPITAL_COMMUNITY): Payer: Medicare Other

## 2012-06-28 NOTE — Telephone Encounter (Signed)
Can give her ambien 10mg  #20, with 3 refills

## 2012-06-29 ENCOUNTER — Other Ambulatory Visit: Payer: Self-pay | Admitting: *Deleted

## 2012-06-29 ENCOUNTER — Other Ambulatory Visit: Payer: Self-pay | Admitting: Licensed Clinical Social Worker

## 2012-06-29 DIAGNOSIS — S32030A Wedge compression fracture of third lumbar vertebra, initial encounter for closed fracture: Secondary | ICD-10-CM

## 2012-06-29 MED ORDER — HYDROCODONE-IBUPROFEN 7.5-200 MG PO TABS: 1.0000 | ORAL_TABLET | Freq: Three times a day (TID) | ORAL | Status: DC | PRN

## 2012-06-29 NOTE — Telephone Encounter (Signed)
Patient called to have this medication refilled. Advised the patient she will be referred to a pain clinic and Orthopedic as Dr Drue Second feels this is a better long term solution to her pain issue. Advised her will call when the appts are made.

## 2012-06-29 NOTE — Telephone Encounter (Signed)
Amended previous rx to follow Dr. Feliz Beam refill instructions of 06/28/12.  Ambien 10mg , 1 tablet qhs prn #20 w/ 2 refills. rx already refilled w/ #30 and 2 refills on 06/18/12.

## 2012-06-30 ENCOUNTER — Telehealth: Payer: Self-pay | Admitting: *Deleted

## 2012-06-30 DIAGNOSIS — G4700 Insomnia, unspecified: Secondary | ICD-10-CM

## 2012-06-30 MED ORDER — ZOLPIDEM TARTRATE 10 MG PO TABS: 10.0000 mg | ORAL_TABLET | Freq: Every evening | ORAL | Status: DC | PRN

## 2012-06-30 NOTE — Telephone Encounter (Signed)
Spoke with [facility] to make sure that the previous Ambien rx had been discontinued and the new rx for #20 tablets will be used for the next refill.

## 2012-07-06 ENCOUNTER — Other Ambulatory Visit: Payer: Self-pay | Admitting: Medical Oncology

## 2012-07-07 ENCOUNTER — Ambulatory Visit: Payer: Medicare Other | Admitting: Lab

## 2012-07-07 ENCOUNTER — Encounter: Payer: Self-pay | Admitting: Internal Medicine

## 2012-07-07 ENCOUNTER — Ambulatory Visit: Payer: Medicare Other

## 2012-07-07 ENCOUNTER — Other Ambulatory Visit: Payer: Medicare Other | Admitting: Lab

## 2012-07-07 ENCOUNTER — Ambulatory Visit (HOSPITAL_BASED_OUTPATIENT_CLINIC_OR_DEPARTMENT_OTHER): Payer: Medicare Other | Admitting: Internal Medicine

## 2012-07-07 VITALS — BP 145/81 | HR 67 | Temp 98.2°F | Resp 20 | Ht 60.0 in | Wt 165.0 lb

## 2012-07-07 DIAGNOSIS — D472 Monoclonal gammopathy: Secondary | ICD-10-CM

## 2012-07-07 LAB — COMPREHENSIVE METABOLIC PANEL (CC13)
ALT: 14 U/L (ref 0–55)
AST: 29 U/L (ref 5–34)
Albumin: 3.3 g/dL — ABNORMAL LOW (ref 3.5–5.0)
Alkaline Phosphatase: 126 U/L (ref 40–150)
BUN: 16.3 mg/dL (ref 7.0–26.0)
CO2: 29 mEq/L (ref 22–29)
Calcium: 9.3 mg/dL (ref 8.4–10.4)
Chloride: 104 mEq/L (ref 98–107)
Creatinine: 0.9 mg/dL (ref 0.6–1.1)
Glucose: 88 mg/dl (ref 70–99)
Potassium: 4.3 mEq/L (ref 3.5–5.1)
Sodium: 138 mEq/L (ref 136–145)
Total Bilirubin: 0.83 mg/dL (ref 0.20–1.20)
Total Protein: 9.8 g/dL — ABNORMAL HIGH (ref 6.4–8.3)

## 2012-07-07 LAB — CBC WITH DIFFERENTIAL/PLATELET
BASO%: 0.7 % (ref 0.0–2.0)
Basophils Absolute: 0 10*3/uL (ref 0.0–0.1)
EOS%: 0.9 % (ref 0.0–7.0)
Eosinophils Absolute: 0 10*3/uL (ref 0.0–0.5)
HCT: 38.5 % (ref 34.8–46.6)
HGB: 12.8 g/dL (ref 11.6–15.9)
LYMPH%: 25.7 % (ref 14.0–49.7)
MCH: 30.3 pg (ref 25.1–34.0)
MCHC: 33.2 g/dL (ref 31.5–36.0)
MCV: 91.2 fL (ref 79.5–101.0)
MONO#: 0.2 10*3/uL (ref 0.1–0.9)
MONO%: 5.9 % (ref 0.0–14.0)
NEUT#: 2.6 10*3/uL (ref 1.5–6.5)
NEUT%: 66.8 % (ref 38.4–76.8)
Platelets: 147 10*3/uL (ref 145–400)
RBC: 4.23 10*6/uL (ref 3.70–5.45)
RDW: 13 % (ref 11.2–14.5)
WBC: 4 10*3/uL (ref 3.9–10.3)
lymph#: 1 10*3/uL (ref 0.9–3.3)

## 2012-07-07 LAB — LACTATE DEHYDROGENASE (CC13): LDH: 183 U/L (ref 125–245)

## 2012-07-07 NOTE — Progress Notes (Signed)
Vanessa Beard [phone]   [phone]  CONSULT NOTE  REFERRING PHYSICIAN: Dr. Talmage Coin.  REASON FOR CONSULTATION:  62 years old [place] American female with monoclonal gammopathy.  HPI Vanessa Beard is a 62 y.o. female was past medical history significant for HIV, osteoporosis, secondary hyperparathyroidism as well as hypertension and vitamin D deficiency. The patient was seen by Dr. Sharl Ma for routine evaluation for back pain. She was found to have osteoporosis with compression fracture of the T9, T10 and T11. Serum protein electrophoreses was performed on 06/14/2012 and it showed M spike of 2.0 G./DL. The patient was referred to me today for further evaluation and recommendation regarding this abnormality. She is feeling fine today with no specific complaints except for the back ache. She is currently followed by Dr. Yehuda Budd her HIV. She is currently on a combination of raltegravir /rilpivirine/emtriva.  Her family history significant for a father who died from alcoholic complication, mother still alive with diabetes mellitus, brother with prostate cancer and 2 sister with HIV. The patient is married and has 2 children. 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 She has no history of smoking but drinks alcohol occasionally and no history of drug abuse.  @SFHPI @  Past Medical History  Diagnosis Date  . HIV infection   . Osteoporosis     No past surgical history on file.  Family History  Problem Relation Age of Onset  . Diabetes Mother   . Hypertension Mother     Social History History  Substance Use Topics  . Smoking status: Never Smoker   . Smokeless tobacco: Never Used  . Alcohol Use: 1.5 oz/week    3 drink(s) per week     Comment: wine or Santilli alcohol    Allergies  Allergen Reactions  . Dapsone   . Retrovir (Zidovudine)   . Sulfamethoxazole W-Trimethoprim     Current Outpatient Prescriptions  Medication Sig Dispense  Refill  . amLODipine (NORVASC) 10 MG tablet Take 1 tablet (10 mg total) by mouth daily.  90 tablet  3  . Calcium Carbonate-Vitamin D (CALCIUM 600-D) 600-400 MG-UNIT per tablet Take 1 tablet by mouth 2 (two) times daily.  100 tablet  3  . emtricitabine (EMTRIVA) 200 MG capsule Take 1 capsule (200 mg total) by mouth daily.  30 capsule  5  . HYDROcodone-ibuprofen (VICOPROFEN) 7.5-200 MG per tablet Take 1 tablet by mouth every 8 (eight) hours as needed for pain.  90 tablet  0  . raltegravir (ISENTRESS) 400 MG tablet Take 1 tablet (400 mg total) by mouth 2 (two) times daily.  60 tablet  5  . rilpivirine (EDURANT) 25 MG TABS tablet Take 1 tablet (25 mg total) by mouth daily. Take with a 400-600-calorie meal.  30 tablet  5  . zolpidem (AMBIEN) 10 MG tablet Take 1 tablet (10 mg total) by mouth at bedtime as needed for sleep.  20 tablet  2   No current facility-administered medications for this visit.    Review of Systems  A comprehensive review of systems was negative except for: Musculoskeletal: positive for back pain  Physical Exam  KGM:WNUUV, healthy, no distress, well nourished and well developed SKIN: skin color, texture, turgor are normal HEAD: Normocephalic, No masses, lesions, tenderness or abnormalities EYES: normal EARS: External ears normal OROPHARYNX:no exudate and no erythema  NECK: supple, no adenopathy LYMPH:  no palpable lymphadenopathy, no hepatosplenomegaly BREAST:not examined LUNGS: clear to auscultation  HEART: regular rate & rhythm,  no murmurs and no gallops ABDOMEN:abdomen soft, non-tender, normal bowel sounds and no masses or organomegaly BACK: Back symmetric, no curvature. EXTREMITIES:no edema, no skin discoloration  NEURO: alert & oriented x 3 with fluent speech, no focal motor/sensory deficits  PERFORMANCE STATUS: ECOG 1  LABORATORY DATA: Lab Results  Component Value Date   WBC 3.1* 04/12/2012   HGB 11.8* 04/12/2012   HCT 33.4* 04/12/2012   MCV 85.9  04/12/2012   PLT 175 04/12/2012      Chemistry      Component Value Date/Time   NA 136 04/12/2012 0957   K 3.6 04/12/2012 0957   CL 103 04/12/2012 0957   CO2 29 04/12/2012 0957   BUN 12 04/12/2012 0957   CREATININE 0.78 04/12/2012 0957   CREATININE 1.10 06/11/2010 2038      Component Value Date/Time   CALCIUM 8.9 04/12/2012 0957   ALKPHOS 108 04/12/2012 0957   AST 25 04/12/2012 0957   ALT 11 04/12/2012 0957   BILITOT 0.7 04/12/2012 0957       RADIOGRAPHIC STUDIES: No results found.  ASSESSMENT: this is a very pleasant 62 years old African American femalewith monoclonal gammopathy suspicious for multiple myeloma.  PLAN: I have a lengthy discussion with the patient today about her condition. I recommended for her several studies to rule out multiple myeloma including repeat CBC, comprehensive metabolic panel, LDH as well as myeloma panel. I would also consider the patient for skeletal bone survey. I discussed with the patient proceeding with a bone marrow biopsy and aspirate for evaluation of the monoclonal gammopathy and to rule out multiple myeloma but she declined to proceed with the bone marrow biopsy at this point. I would see her back for follow up visit in 3 weeks for evaluation and discussion of her lab and discectomy bone survey. She was advised to call me immediately she has any concerning symptoms in the interval. I gave the patient the time to ask questions and I answered them completely to her satisfaction. All questions were answered. The patient knows to call the clinic with any problems, questions or concerns. We can certainly see the patient much sooner if necessary.  Thank you so much for allowing me to participate in the care of Vanessa Beard. I will continue to follow up the patient with you and assist in her care.  I spent 25 minutes counseling the patient face to face. The total time spent in the appointment was 50 minutes.   Vanessa Beard  K. 07/07/2012, 2:54 PM

## 2012-07-07 NOTE — Progress Notes (Signed)
Checked in new pt with no financial concerns. °

## 2012-07-07 NOTE — Patient Instructions (Signed)
You have monoclonal gammopathy suspicious for multiple myeloma. I will order lab work as well as a skeletal bone survey for further evaluation of this condition. We discussed proceeding with a bone marrow biopsy and aspirate but she declined it at this point. Follow up in 3 weeks

## 2012-07-08 LAB — KAPPA/LAMBDA LIGHT CHAINS
Kappa free light chain: 5.19 mg/dL — ABNORMAL HIGH (ref 0.33–1.94)
Kappa:Lambda Ratio: 1.37 (ref 0.26–1.65)
Lambda Free Lght Chn: 3.79 mg/dL — ABNORMAL HIGH (ref 0.57–2.63)

## 2012-07-08 LAB — IGG, IGA, IGM
IgA: 138 mg/dL (ref 69–380)
IgG (Immunoglobin G), Serum: 3580 mg/dL — ABNORMAL HIGH (ref 690–1700)
IgM, Serum: 153 mg/dL (ref 52–322)

## 2012-07-08 LAB — BETA 2 MICROGLOBULIN, SERUM: Beta-2 Microglobulin: 2.49 mg/L — ABNORMAL HIGH (ref 1.01–1.73)

## 2012-07-09 ENCOUNTER — Telehealth: Payer: Self-pay | Admitting: Internal Medicine

## 2012-07-13 ENCOUNTER — Other Ambulatory Visit (HOSPITAL_COMMUNITY): Payer: Medicare Other

## 2012-07-15 ENCOUNTER — Ambulatory Visit (HOSPITAL_COMMUNITY)
Admission: RE | Admit: 2012-07-15 | Discharge: 2012-07-15 | Disposition: A | Discharge status: Home / Self Care | Payer: Medicare Other | Source: Ambulatory Visit | Attending: Internal Medicine | Admitting: Internal Medicine

## 2012-07-15 DIAGNOSIS — X58XXXA Exposure to other specified factors, initial encounter: Secondary | ICD-10-CM | POA: Insufficient documentation

## 2012-07-15 DIAGNOSIS — S32009A Unspecified fracture of unspecified lumbar vertebra, initial encounter for closed fracture: Secondary | ICD-10-CM | POA: Insufficient documentation

## 2012-07-15 DIAGNOSIS — D472 Monoclonal gammopathy: Secondary | ICD-10-CM | POA: Insufficient documentation

## 2012-07-26 ENCOUNTER — Other Ambulatory Visit (INDEPENDENT_AMBULATORY_CARE_PROVIDER_SITE_OTHER): Payer: Medicare Other

## 2012-07-26 ENCOUNTER — Other Ambulatory Visit (HOSPITAL_COMMUNITY)
Admission: RE | Admit: 2012-07-26 | Discharge: 2012-07-26 | Disposition: A | Discharge status: Home / Self Care | Payer: Medicare Other | Source: Ambulatory Visit | Attending: Internal Medicine | Admitting: Internal Medicine

## 2012-07-26 ENCOUNTER — Other Ambulatory Visit: Payer: Self-pay | Admitting: Internal Medicine

## 2012-07-26 DIAGNOSIS — Z113 Encounter for screening for infections with a predominantly sexual mode of transmission: Secondary | ICD-10-CM

## 2012-07-26 DIAGNOSIS — B20 Human immunodeficiency virus [HIV] disease: Secondary | ICD-10-CM

## 2012-07-27 ENCOUNTER — Other Ambulatory Visit: Payer: Medicare Other | Admitting: Lab

## 2012-07-27 ENCOUNTER — Telehealth: Payer: Self-pay | Admitting: Internal Medicine

## 2012-07-27 ENCOUNTER — Telehealth: Payer: Self-pay

## 2012-07-27 ENCOUNTER — Other Ambulatory Visit: Payer: Self-pay | Admitting: Medical Oncology

## 2012-07-27 LAB — URINALYSIS, ROUTINE W REFLEX MICROSCOPIC
Glucose, UA: NEGATIVE mg/dL
Hgb urine dipstick: NEGATIVE
Ketones, ur: NEGATIVE mg/dL
Nitrite: NEGATIVE
Protein, ur: NEGATIVE mg/dL
Specific Gravity, Urine: 1.03 (ref 1.005–1.030)
Urobilinogen, UA: 1 mg/dL (ref 0.0–1.0)
pH: 6 (ref 5.0–8.0)

## 2012-07-27 LAB — URINALYSIS, MICROSCOPIC ONLY
Bacteria, UA: NONE SEEN
Casts: NONE SEEN
Crystals: NONE SEEN

## 2012-07-27 LAB — T-HELPER CELL (CD4) - (RCID CLINIC ONLY)
CD4 % Helper T Cell: 26 % — ABNORMAL LOW (ref 33–55)
CD4 T Cell Abs: 340 uL — ABNORMAL LOW (ref 400–2700)

## 2012-07-27 LAB — HIV-1 RNA QUANT-NO REFLEX-BLD
HIV 1 RNA Quant: 31 copies/mL — ABNORMAL HIGH (ref <20)
HIV-1 RNA Quant, Log: 1.49 {Log} — ABNORMAL HIGH (ref <1.30)

## 2012-07-27 MED ORDER — HYDROCODONE-IBUPROFEN 7.5-200 MG PO TABS: 1.0000 | ORAL_TABLET | Freq: Three times a day (TID) | ORAL | Status: DC | PRN

## 2012-07-27 NOTE — Telephone Encounter (Signed)
returned pt call and she was livid bc no one advised her on the 3.11.14 appt....i asked her would she like to r/s and she stated that she didnt want to do anything and woule like to talk to the Dr..Marland KitchenMarland KitchenI transferred that pt to the nurses desk.

## 2012-07-27 NOTE — Telephone Encounter (Signed)
Pt calling for refill of hydrocodone 7.5/200 . She has asked that it be called to the pharmacy.  Medication called to pharmacy.   Laurell Josephs, RN

## 2012-07-29 ENCOUNTER — Ambulatory Visit: Payer: Medicare Other | Admitting: Internal Medicine

## 2012-07-31 ENCOUNTER — Telehealth: Payer: Self-pay | Admitting: Internal Medicine

## 2012-07-31 NOTE — Telephone Encounter (Signed)
lmonvm advising the pt of her march 2014 appt.

## 2012-08-09 ENCOUNTER — Encounter: Payer: Self-pay | Admitting: Internal Medicine

## 2012-08-09 ENCOUNTER — Ambulatory Visit (INDEPENDENT_AMBULATORY_CARE_PROVIDER_SITE_OTHER): Payer: Medicare Other | Admitting: Internal Medicine

## 2012-08-09 VITALS — BP 142/94 | HR 69 | Temp 97.1°F | Wt 165.0 lb

## 2012-08-09 DIAGNOSIS — M549 Dorsalgia, unspecified: Secondary | ICD-10-CM

## 2012-08-09 DIAGNOSIS — S32000S Wedge compression fracture of unspecified lumbar vertebra, sequela: Secondary | ICD-10-CM

## 2012-08-09 DIAGNOSIS — IMO0002 Reserved for concepts with insufficient information to code with codable children: Secondary | ICD-10-CM

## 2012-08-09 NOTE — Progress Notes (Signed)
HIV CLINIC NOTE  RFV: 3 month routine follow up Subjective:    Patient ID: Vanessa Beard, female    DOB: 1950-09-20, 62 y.o.   MRN: [id number]  HPI Beatrics is a 62yo F with HIV,CD 4 count 340/ VL 31 (mar 2014) on raltegravir /rilpivirine/emtriva. Overall doing well with exception to back pain, from L4 compression fracture.  And now new right shoulder pain in the last month, unable to lift right arm above head. Doesn't remember injuring her arm or sleeping on it improperly. She states she is taking pain medication she is prescribed for back pain but not much relief.no weakness no loss of bowel or bladder function.  No fever,chills, nightsweats. Has a lot of stress, now mom living with her who requires 24hr a day  Also following up with Dr. 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  Current Outpatient Prescriptions on File Prior to Visit  Medication Sig Dispense Refill  . amLODipine (NORVASC) 10 MG tablet Take 1 tablet (10 mg total) by mouth daily.  90 tablet  3  . Calcium Carbonate-Vitamin D (CALCIUM 600-D) 600-400 MG-UNIT per tablet Take 1 tablet by mouth 2 (two) times daily.  100 tablet  3  . emtricitabine (EMTRIVA) 200 MG capsule Take 1 capsule (200 mg total) by mouth daily.  30 capsule  5  . HYDROcodone-ibuprofen (VICOPROFEN) 7.5-200 MG per tablet Take 1 tablet by mouth every 8 (eight) hours as needed for pain.  90 tablet  0  . raltegravir (ISENTRESS) 400 MG tablet Take 1 tablet (400 mg total) by mouth 2 (two) times daily.  60 tablet  5  . rilpivirine (EDURANT) 25 MG TABS tablet Take 1 tablet (25 mg total) by mouth daily. Take with a 400-600-calorie meal.  30 tablet  5  . zolpidem (AMBIEN) 10 MG tablet Take 1 tablet (10 mg total) by mouth at bedtime as needed for sleep.  20 tablet  2   No current facility-administered medications on file prior to visit.     Review of  Systems 10 point ROS reviewed, positive pertinents listed in hpi    Objective:   Physical Exam BP 142/94  Pulse 69  Temp(Src) 97.1 F (36.2 C) (Oral)  Wt 165 lb (74.844 kg)  BMI 32.22 kg/m2 Physical Exam  Constitutional:  oriented to person, place, and time.  appears well-developed and well-nourished. No distress.  HENT:  Mouth/Throat: Oropharynx is clear and moist. No oropharyngeal exudate.  Cardiovascular: Normal rate, regular rhythm and normal heart sounds. Exam reveals no gallop and no friction rub.  No murmur heard.  Pulmonary/Chest: Effort normal and breath sounds normal. No respiratory distress. He has no wheezes.  Abdominal: Soft. Bowel sounds are normal. He exhibits no distension. There is no tenderness.  Lymphadenopathy:  no cervical adenopathy.  MSK/neuro= equal grip strength. Pain with passive range of motion, external rotation and pain with lifting right arm above shoulder  Skin: Skin is warm and dry. No rash noted. No erythema.  Psychiatric:  normal mood and affect. behavior is normal.      Assessment & Plan:  Right shoulder bursitis = will refer to ortho to evaluate if has bursitis and would benefit from aspirate/steroid injection to be seen in 2 days  Back pain from compression fracture = will defer to PM & R to see if patient would benefit from PT  vs. Kyphoplasty. Currently being prescribe pain medications from [place].  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 Skeletal bone exam is without any lytic lesions. Discussed the importance for her to continue her follow up appt.  HIV = continue with her current regimen  rtc in 3 months

## 2012-08-16 ENCOUNTER — Telehealth: Payer: Self-pay | Admitting: Internal Medicine

## 2012-08-16 ENCOUNTER — Encounter: Payer: Self-pay | Admitting: Internal Medicine

## 2012-08-16 ENCOUNTER — Ambulatory Visit (HOSPITAL_BASED_OUTPATIENT_CLINIC_OR_DEPARTMENT_OTHER): Payer: Medicare Other | Admitting: Internal Medicine

## 2012-08-16 VITALS — BP 138/89 | HR 78 | Temp 97.4°F | Resp 20 | Ht 60.0 in | Wt 166.4 lb

## 2012-08-16 DIAGNOSIS — D472 Monoclonal gammopathy: Secondary | ICD-10-CM

## 2012-08-16 NOTE — Telephone Encounter (Signed)
appts made and printed for pt aom °

## 2012-08-16 NOTE — Patient Instructions (Signed)
Follow up Visit in 3 months with repeat myeloma

## 2012-08-16 NOTE — Progress Notes (Signed)
Vanessa Beard [facility] [phone]   [phone]  OFFICE PROGRESS NOTE  No primary provider on file. No primary provider on file.  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 2) HIV  PRIOR THERAPY: None.  CURRENT THERAPY: Observation.  INTERVAL HISTORY: Vanessa Beard 62 y.o. female returns to the clinic today for followup visit. The patient is feeling fine today with no specific complaints. She underwent several studies to evaluate the question of multiple myeloma. The patient had skeletal bone survey performed on 07/15/2012 that was negative for lytic myelomatous bone lesions but there is stable L4 compression fracture. Myeloma panel to 19 2014 showed Beta 2 microglobulin 2.49, kappa light chain 5.19, free lambda light chains 3.79, with a kappa/lambda ratio of 1.37. IgG 3580, IgA 138 and IgM 153. She is here today for evaluation and discussion of her lab and imaging results. The patient denied having any complaints and specifically no chest pain, shortness breath, cough or hemoptysis. She denied having any significant weight loss or night sweats.  MEDICAL HISTORY: Past Medical History  Diagnosis Date  . HIV infection   . Osteoporosis     ALLERGIES:  is allergic to dapsone; retrovir; and sulfamethoxazole w-trimethoprim.  MEDICATIONS:  Current Outpatient Prescriptions  Medication Sig Dispense Refill  . amLODipine (NORVASC) 10 MG tablet Take 1 tablet (10 mg total) by mouth daily.  90 tablet  3  . Calcium Carbonate-Vitamin D (CALCIUM 600-D) 600-400 MG-UNIT per tablet Take 1 tablet by mouth 2 (two) times daily.  100 tablet  3  . diclofenac (VOLTAREN) 75 MG EC tablet Take 1 tablet by mouth at bedtime.      Marland Kitchen emtricitabine (EMTRIVA) 200 MG capsule Take 1 capsule (200 mg total) by mouth daily.  30 capsule  5  . HYDROcodone-ibuprofen (VICOPROFEN) 7.5-200 MG per tablet Take 1 tablet by mouth every 8  (eight) hours as needed for pain.  90 tablet  0  . raltegravir (ISENTRESS) 400 MG tablet Take 1 tablet (400 mg total) by mouth 2 (two) times daily.  60 tablet  5  . rilpivirine (EDURANT) 25 MG TABS tablet Take 1 tablet (25 mg total) by mouth daily. Take with a 400-600-calorie meal.  30 tablet  5  . zolpidem (AMBIEN) 10 MG tablet Take 1 tablet (10 mg total) by mouth at bedtime as needed for sleep.  20 tablet  2   No current facility-administered medications for this visit.    REVIEW OF SYSTEMS:  A comprehensive review of systems was negative.   PHYSICAL EXAMINATION: General appearance: alert, cooperative and no distress Head: Normocephalic, without obvious abnormality, atraumatic Neck: no adenopathy Resp: clear to auscultation bilaterally Cardio: regular rate and rhythm, S1, S2 normal, no murmur, click, rub or gallop GI: soft, non-tender; bowel sounds normal; no masses,  no organomegaly Extremities: extremities normal, atraumatic, no cyanosis or edema Neurologic: Alert and oriented X 3, normal strength and tone. Normal symmetric reflexes. Normal coordination and gait  ECOG PERFORMANCE STATUS: 0 - Asymptomatic  Blood pressure 138/89, pulse 78, temperature 97.4 F (36.3 C), temperature source Oral, resp. rate 20, height 5' (1.524 m), weight 166 lb 6.4 oz (75.479 kg).  LABORATORY DATA: Lab Results  Component Value Date   WBC 4.0 07/07/2012   HGB 12.8 07/07/2012   HCT 38.5 07/07/2012   MCV 91.2 07/07/2012   PLT 147 07/07/2012      Chemistry      Component Value Date/Time  NA 138 07/07/2012 1512   NA 136 04/12/2012 0957   K 4.3 07/07/2012 1512   K 3.6 04/12/2012 0957   CL 104 07/07/2012 1512   CL 103 04/12/2012 0957   CO2 29 07/07/2012 1512   CO2 29 04/12/2012 0957   BUN 16.3 07/07/2012 1512   BUN 12 04/12/2012 0957   CREATININE 0.9 07/07/2012 1512   CREATININE 0.78 04/12/2012 0957   CREATININE 1.10 06/11/2010 2038      Component Value Date/Time   CALCIUM 9.3 07/07/2012 1512    CALCIUM 8.9 04/12/2012 0957   ALKPHOS 126 07/07/2012 1512   ALKPHOS 108 04/12/2012 0957   AST 29 07/07/2012 1512   AST 25 04/12/2012 0957   ALT 14 07/07/2012 1512   ALT 11 04/12/2012 0957   BILITOT 0.83 07/07/2012 1512   BILITOT 0.7 04/12/2012 0957       RADIOGRAPHIC STUDIES: No results found.  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   PLAN: I discussed the lab result with the patient today. I strongly recommended her to consider bone marrow biopsy and aspirate to rule out or confirm a diagnosis of multiple myeloma but the patient still reluctant to consider this procedure and she would like to wait for a few more months. I would see her back for followup visit in 3 months with repeat myeloma panel. She was advised to call immediately she has any concerning symptoms in the interval  All questions were answered. The patient knows to call the clinic with any problems, questions or concerns. We can certainly see the patient much sooner if necessary.

## 2012-08-27 ENCOUNTER — Other Ambulatory Visit: Payer: Self-pay | Admitting: *Deleted

## 2012-08-27 DIAGNOSIS — M549 Dorsalgia, unspecified: Secondary | ICD-10-CM

## 2012-08-27 MED ORDER — HYDROCODONE-IBUPROFEN 7.5-200 MG PO TABS: 1.0000 | ORAL_TABLET | Freq: Three times a day (TID) | ORAL | Status: DC | PRN

## 2012-09-09 ENCOUNTER — Other Ambulatory Visit: Payer: Self-pay | Admitting: Internal Medicine

## 2012-09-09 DIAGNOSIS — Z1231 Encounter for screening mammogram for malignant neoplasm of breast: Secondary | ICD-10-CM

## 2012-09-14 ENCOUNTER — Ambulatory Visit (HOSPITAL_COMMUNITY): Payer: Medicare Other

## 2012-09-17 ENCOUNTER — Ambulatory Visit (HOSPITAL_COMMUNITY): Payer: Medicare Other

## 2012-09-23 ENCOUNTER — Ambulatory Visit (HOSPITAL_COMMUNITY)
Admission: RE | Admit: 2012-09-23 | Discharge: 2012-09-23 | Disposition: A | Discharge status: Home / Self Care | Payer: Medicare Other | Source: Ambulatory Visit | Attending: Internal Medicine | Admitting: Internal Medicine

## 2012-09-23 DIAGNOSIS — Z1231 Encounter for screening mammogram for malignant neoplasm of breast: Secondary | ICD-10-CM | POA: Insufficient documentation

## 2012-09-24 ENCOUNTER — Other Ambulatory Visit: Payer: Self-pay | Admitting: *Deleted

## 2012-09-24 DIAGNOSIS — G4700 Insomnia, unspecified: Secondary | ICD-10-CM

## 2012-09-24 MED ORDER — ZOLPIDEM TARTRATE 10 MG PO TABS: 10.0000 mg | ORAL_TABLET | Freq: Every evening | ORAL | Status: DC | PRN

## 2012-09-27 ENCOUNTER — Other Ambulatory Visit: Payer: Self-pay | Admitting: Licensed Clinical Social Worker

## 2012-09-27 DIAGNOSIS — M549 Dorsalgia, unspecified: Secondary | ICD-10-CM

## 2012-09-27 MED ORDER — HYDROCODONE-IBUPROFEN 7.5-200 MG PO TABS: 1.0000 | ORAL_TABLET | Freq: Three times a day (TID) | ORAL | Status: DC | PRN

## 2012-10-21 ENCOUNTER — Other Ambulatory Visit: Payer: Self-pay | Admitting: *Deleted

## 2012-10-21 DIAGNOSIS — I10 Essential (primary) hypertension: Secondary | ICD-10-CM

## 2012-10-21 MED ORDER — AMLODIPINE BESYLATE 10 MG PO TABS: 10.0000 mg | ORAL_TABLET | Freq: Every day | ORAL | Status: DC

## 2012-10-28 ENCOUNTER — Other Ambulatory Visit: Payer: Self-pay | Admitting: *Deleted

## 2012-10-28 DIAGNOSIS — M549 Dorsalgia, unspecified: Secondary | ICD-10-CM

## 2012-10-28 MED ORDER — HYDROCODONE-IBUPROFEN 7.5-200 MG PO TABS: 1.0000 | ORAL_TABLET | Freq: Three times a day (TID) | ORAL | Status: DC | PRN

## 2012-11-02 ENCOUNTER — Other Ambulatory Visit: Payer: Medicare Other

## 2012-11-02 DIAGNOSIS — B20 Human immunodeficiency virus [HIV] disease: Secondary | ICD-10-CM

## 2012-11-02 LAB — CBC WITH DIFFERENTIAL/PLATELET
Basophils Absolute: 0 10*3/uL (ref 0.0–0.1)
Basophils Relative: 1 % (ref 0–1)
Eosinophils Absolute: 0.1 10*3/uL (ref 0.0–0.7)
Eosinophils Relative: 1 % (ref 0–5)
HCT: 33.6 % — ABNORMAL LOW (ref 36.0–46.0)
Hemoglobin: 11.3 g/dL — ABNORMAL LOW (ref 12.0–15.0)
Lymphocytes Relative: 20 % (ref 12–46)
Lymphs Abs: 1.1 10*3/uL (ref 0.7–4.0)
MCH: 30.1 pg (ref 26.0–34.0)
MCHC: 33.6 g/dL (ref 30.0–36.0)
MCV: 89.4 fL (ref 78.0–100.0)
Monocytes Absolute: 0.3 10*3/uL (ref 0.1–1.0)
Monocytes Relative: 5 % (ref 3–12)
Neutro Abs: 4.1 10*3/uL (ref 1.7–7.7)
Neutrophils Relative %: 73 % (ref 43–77)
Platelets: 219 10*3/uL (ref 150–400)
RBC: 3.76 MIL/uL — ABNORMAL LOW (ref 3.87–5.11)
RDW: 15 % (ref 11.5–15.5)
WBC: 5.6 10*3/uL (ref 4.0–10.5)

## 2012-11-02 NOTE — Addendum Note (Signed)
Addended by: Mariea Clonts D on: 11/02/2012 02:47 PM   Modules accepted: Orders

## 2012-11-03 LAB — COMPREHENSIVE METABOLIC PANEL
ALT: 17 U/L (ref 0–35)
AST: 31 U/L (ref 0–37)
Albumin: 3.3 g/dL — ABNORMAL LOW (ref 3.5–5.2)
Alkaline Phosphatase: 85 U/L (ref 39–117)
BUN: 15 mg/dL (ref 6–23)
CO2: 26 mEq/L (ref 19–32)
Calcium: 8.7 mg/dL (ref 8.4–10.5)
Chloride: 106 mEq/L (ref 96–112)
Creat: 0.71 mg/dL (ref 0.50–1.10)
Glucose, Bld: 98 mg/dL (ref 70–99)
Potassium: 3.3 mEq/L — ABNORMAL LOW (ref 3.5–5.3)
Sodium: 139 mEq/L (ref 135–145)
Total Bilirubin: 0.8 mg/dL (ref 0.3–1.2)
Total Protein: 8.4 g/dL — ABNORMAL HIGH (ref 6.0–8.3)

## 2012-11-04 LAB — HIV-1 RNA QUANT-NO REFLEX-BLD
HIV 1 RNA Quant: 49 copies/mL — ABNORMAL HIGH (ref <20)
HIV-1 RNA Quant, Log: 1.69 {Log} — ABNORMAL HIGH (ref <1.30)

## 2012-11-11 ENCOUNTER — Telehealth: Payer: Self-pay | Admitting: *Deleted

## 2012-11-11 NOTE — Telephone Encounter (Signed)
Orthopedic Surgery called.  Needing medical clearance for surgery from Dr. Drue Second and current lab work.  Recent lab work completed and faxed to orthopedic surgery office.  Pt has upcoming OV on 11/16/12 w/ Dr. Drue Second.  Will provide Dr. Drue Second with medical clearance form.  Orthopedic surgery office faxing form.  Form received and placed in Dr. Feliz Beam box for completion.

## 2012-11-16 ENCOUNTER — Ambulatory Visit: Payer: Medicare Other | Admitting: Internal Medicine

## 2012-11-18 ENCOUNTER — Emergency Department (HOSPITAL_COMMUNITY)
Admission: EM | Admit: 2012-11-18 | Discharge: 2012-11-19 | Disposition: A | Discharge status: Home / Self Care | Payer: Medicare Other | Attending: Emergency Medicine | Admitting: Emergency Medicine

## 2012-11-18 ENCOUNTER — Ambulatory Visit (INDEPENDENT_AMBULATORY_CARE_PROVIDER_SITE_OTHER): Payer: Medicare Other | Admitting: Internal Medicine

## 2012-11-18 ENCOUNTER — Encounter: Payer: Self-pay | Admitting: Internal Medicine

## 2012-11-18 ENCOUNTER — Encounter (HOSPITAL_COMMUNITY): Payer: Self-pay | Admitting: Emergency Medicine

## 2012-11-18 VITALS — BP 132/86 | HR 65 | Temp 98.4°F | Wt 157.0 lb

## 2012-11-18 DIAGNOSIS — E876 Hypokalemia: Secondary | ICD-10-CM

## 2012-11-18 DIAGNOSIS — K8050 Calculus of bile duct without cholangitis or cholecystitis without obstruction: Secondary | ICD-10-CM

## 2012-11-18 DIAGNOSIS — Z21 Asymptomatic human immunodeficiency virus [HIV] infection status: Secondary | ICD-10-CM | POA: Insufficient documentation

## 2012-11-18 DIAGNOSIS — B20 Human immunodeficiency virus [HIV] disease: Secondary | ICD-10-CM

## 2012-11-18 DIAGNOSIS — R112 Nausea with vomiting, unspecified: Secondary | ICD-10-CM | POA: Insufficient documentation

## 2012-11-18 DIAGNOSIS — K8020 Calculus of gallbladder without cholecystitis without obstruction: Secondary | ICD-10-CM

## 2012-11-18 DIAGNOSIS — M810 Age-related osteoporosis without current pathological fracture: Secondary | ICD-10-CM | POA: Insufficient documentation

## 2012-11-18 DIAGNOSIS — R079 Chest pain, unspecified: Secondary | ICD-10-CM | POA: Insufficient documentation

## 2012-11-18 DIAGNOSIS — G4700 Insomnia, unspecified: Secondary | ICD-10-CM

## 2012-11-18 DIAGNOSIS — Z79899 Other long term (current) drug therapy: Secondary | ICD-10-CM | POA: Insufficient documentation

## 2012-11-18 LAB — CBC WITH DIFFERENTIAL/PLATELET
Basophils Absolute: 0 10*3/uL (ref 0.0–0.1)
Basophils Relative: 1 % (ref 0–1)
Eosinophils Absolute: 0.1 10*3/uL (ref 0.0–0.7)
Eosinophils Relative: 3 % (ref 0–5)
HCT: 33.9 % — ABNORMAL LOW (ref 36.0–46.0)
Hemoglobin: 11.5 g/dL — ABNORMAL LOW (ref 12.0–15.0)
Lymphocytes Relative: 27 % (ref 12–46)
Lymphs Abs: 1.1 10*3/uL (ref 0.7–4.0)
MCH: 29.5 pg (ref 26.0–34.0)
MCHC: 33.9 g/dL (ref 30.0–36.0)
MCV: 86.9 fL (ref 78.0–100.0)
Monocytes Absolute: 0.4 10*3/uL (ref 0.1–1.0)
Monocytes Relative: 9 % (ref 3–12)
Neutro Abs: 2.5 10*3/uL (ref 1.7–7.7)
Neutrophils Relative %: 60 % (ref 43–77)
Platelets: 189 10*3/uL (ref 150–400)
RBC: 3.9 MIL/uL (ref 3.87–5.11)
RDW: 14.7 % (ref 11.5–15.5)
WBC: 4 10*3/uL (ref 4.0–10.5)

## 2012-11-18 LAB — PROTIME-INR
INR: 1.09 (ref <1.50)
Prothrombin Time: 14.1 seconds (ref 11.6–15.2)

## 2012-11-18 LAB — COMPREHENSIVE METABOLIC PANEL
ALT: 15 U/L (ref 0–35)
AST: 29 U/L (ref 0–37)
Albumin: 3.5 g/dL (ref 3.5–5.2)
Alkaline Phosphatase: 82 U/L (ref 39–117)
BUN: 10 mg/dL (ref 6–23)
CO2: 29 mEq/L (ref 19–32)
Calcium: 9 mg/dL (ref 8.4–10.5)
Chloride: 105 mEq/L (ref 96–112)
Creat: 0.75 mg/dL (ref 0.50–1.10)
Glucose, Bld: 77 mg/dL (ref 70–99)
Potassium: 3.3 mEq/L — ABNORMAL LOW (ref 3.5–5.3)
Sodium: 139 mEq/L (ref 135–145)
Total Bilirubin: 0.9 mg/dL (ref 0.3–1.2)
Total Protein: 8 g/dL (ref 6.0–8.3)

## 2012-11-18 MED ORDER — ABACAVIR SULFATE-LAMIVUDINE 600-300 MG PO TABS: 1.0000 | ORAL_TABLET | Freq: Every day | ORAL | Status: DC

## 2012-11-18 MED ORDER — ZOLPIDEM TARTRATE 10 MG PO TABS: 10.0000 mg | ORAL_TABLET | Freq: Every evening | ORAL | Status: DC | PRN

## 2012-11-18 MED ORDER — DOLUTEGRAVIR SODIUM 50 MG PO TABS: 50.0000 mg | ORAL_TABLET | Freq: Every day | ORAL | Status: DC

## 2012-11-18 NOTE — ED Notes (Signed)
Per EMS: Pt c/o upper abdominal pain around 7 pm today. Not tender to palpation. No nausea/vomiting/diarrhea. Pt HIV positive.

## 2012-11-18 NOTE — Progress Notes (Signed)
[facility] NOTE  RFV: routine visit/pre-op evaluation for right shoulder arthroscopy Subjective:    Patient ID: Vanessa Beard, female    DOB: 12/28/1950, 62 y.o.   MRN: [id number]  HPI  Vanessa Beard is a 62yo F with HIV,CD 4 count 340/ VL 31 (mar 2014) on raltegravir /rilpivirine/emtriva. She reports having pill fatigue. She often misses the 2nd dose raltegravir. Still doing well otherwise. She is having good sleep with ambien, doesn't use every night. 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  She is having right sholder scope with rotator cuff repair on 7/16. Will do labs for Dr. Madelon Lips. Their office has asked [place] to evaluate/medically clearance for upcoming surgery.  She denies any shortness of breath or chest pressure or chest pain or lightheadedness when walking long distances or flights of stair.  Intentional weight loss  Current Outpatient Prescriptions on File Prior to Visit  Medication Sig Dispense Refill  . amLODipine (NORVASC) 10 MG tablet Take 1 tablet (10 mg total) by mouth daily.  90 tablet  3  . Calcium Carbonate-Vitamin D (CALCIUM 600-D) 600-400 MG-UNIT per tablet Take 1 tablet by mouth 2 (two) times daily.  100 tablet  3  . diclofenac (VOLTAREN) 75 MG EC tablet Take 1 tablet by mouth at bedtime.      Marland Kitchen emtricitabine (EMTRIVA) 200 MG capsule Take 1 capsule (200 mg total) by mouth daily.  30 capsule  5  . HYDROcodone-ibuprofen (VICOPROFEN) 7.5-200 MG per tablet Take 1 tablet by mouth every 8 (eight) hours as needed for pain.  90 tablet  0  . raltegravir (ISENTRESS) 400 MG tablet Take 1 tablet (400 mg total) by mouth 2 (two) times daily.  60 tablet  5  . rilpivirine (EDURANT) 25 MG TABS tablet Take 1 tablet (25 mg total) by mouth daily. Take with a 400-600-calorie meal.  30 tablet  5  . zolpidem (AMBIEN) 10 MG tablet Take 1 tablet (10 mg total) by mouth at bedtime as needed for sleep.  20 tablet  2   No  current facility-administered medications on file prior to visit.   Active Ambulatory Problems    Diagnosis Date Noted  . Human Immunodeficiency Virus (HIV) Disease 08/04/2006  . HEPATITIS C, CHRONIC 12/19/2008  . PNEUMOCYSTIS PNEUMONIA 05/07/2007  . HYPERTENSION 05/07/2007  . ALLERGIC RHINITIS 07/09/2007  . DENTAL CARIES 05/07/2007  . LEUKORRHEA 04/07/2008  . BACK PAIN, LUMBAR 11/10/2007  . INSOMNIA 05/07/2007  . FATIGUE 04/07/2008  . COUGH 07/18/2009  . PAP SMEAR, LGSIL, ABNORMAL 12/21/2009  . PNEUMONIA, HX OF 05/07/2007  . HERPES ZOSTER, HX OF 05/07/2007  . Osteopenia 02/03/2011  . Hyperparathyroidism 02/10/2011  . Sore throat 04/02/2011  . Compression fracture of L3 lumbar vertebra 07/01/2011  . Thyroid dysfunction 07/01/2011  . Anemia 07/01/2011  . 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   Resolved Ambulatory Problems    Diagnosis Date Noted  . No Resolved Ambulatory Problems   Past Medical History  Diagnosis Date  . HIV infection   . Osteoporosis    Social hx: does not smoke  Review of Systems  Constitutional: Negative for fever, chills, diaphoresis, activity change, appetite change, fatigue and unexpected weight change.  HENT: Negative for congestion, sore throat, rhinorrhea, sneezing, trouble swallowing and sinus pressure.  Eyes: Negative for photophobia and visual disturbance.  Respiratory: Negative for cough, chest tightness, shortness of breath, wheezing and stridor.  Cardiovascular: Negative for chest  pain, palpitations and leg swelling.  Gastrointestinal: Negative for nausea, vomiting, abdominal pain, diarrhea, constipation, blood in stool, abdominal distention and anal bleeding.  Genitourinary: Negative for dysuria, hematuria, flank pain and difficulty urinating.  Musculoskeletal: right shoulder decreased range of motion as previously described at last visit Skin: Negative for color change, pallor, rash and wound.  Neurological:  Negative for dizziness, tremors, weakness and light-headedness.  Hematological: Negative for adenopathy. Does not bruise/bleed easily.  Psychiatric/Behavioral: Negative for behavioral problems, confusion, sleep disturbance, dysphoric mood, decreased concentration and agitation.        Objective:   Physical Exam BP 132/86  Pulse 65  Temp(Src) 98.4 F (36.9 C) (Oral)  Wt 157 lb (71.215 kg)  BMI 30.66 kg/m2 Physical Exam  Constitutional: oriented to person, place, and time.  appears well-developed and well-nourished. No distress.  HENT:  Mouth/Throat: Oropharynx is clear and moist. No oropharyngeal exudate.  Cardiovascular: Normal rate, regular rhythm and normal heart sounds. Exam reveals no gallop and no friction rub.  No murmur heard.  Pulmonary/Chest: Effort normal and breath sounds normal. No respiratory distress.  no wheezes.  Abdominal: Soft. Bowel sounds are normal.  exhibits no distension. There is no tenderness.  Lymphadenopathy:  no cervical adenopathy.  Neurological:  alert and oriented to person, place, and time.  Skin: Skin is warm and dry. No rash noted. No erythema.  Psychiatric: a normal mood and affect. His behavior is normal.   Labs: Lab Results  Component Value Date   WBC 8.6 11/19/2012   HGB 12.3 11/19/2012   HCT 37.1 11/19/2012   MCV 90.9 11/19/2012   PLT 193 11/19/2012       Assessment & Plan:  HIV = CD 4 count of 340/VL 31 having difficulty with number of pills  And BID dosing with current regimen but remains to be well controlled. will discuss changing to [facility] plus abacavir/lamivudine at next visit if she still continues to miss evening doses of raltegravir  Right shoulder rotator cuff surgery = she is cleared for surgery, will get labs to inc cbc, pt/inr, bmp, cd 4 count and viral load. She is clear from cardial and medical standpoint.  Insomnia = refill ambien 30 # per month  Mild hypokalemia = asked her to increase dietary potassium intake  rtc in 3  months

## 2012-11-19 ENCOUNTER — Emergency Department (HOSPITAL_COMMUNITY): Payer: Medicare Other

## 2012-11-19 LAB — URINALYSIS, ROUTINE W REFLEX MICROSCOPIC
Bilirubin Urine: NEGATIVE
Glucose, UA: NEGATIVE mg/dL
Hgb urine dipstick: NEGATIVE
Ketones, ur: NEGATIVE mg/dL
Leukocytes, UA: NEGATIVE
Nitrite: NEGATIVE
Protein, ur: NEGATIVE mg/dL
Specific Gravity, Urine: 1.017 (ref 1.005–1.030)
Urobilinogen, UA: 1 mg/dL (ref 0.0–1.0)
pH: 8 (ref 5.0–8.0)

## 2012-11-19 LAB — COMPREHENSIVE METABOLIC PANEL
ALT: 17 U/L (ref 0–35)
AST: 33 U/L (ref 0–37)
Albumin: 3.6 g/dL (ref 3.5–5.2)
Alkaline Phosphatase: 99 U/L (ref 39–117)
BUN: 11 mg/dL (ref 6–23)
CO2: 27 mEq/L (ref 19–32)
Calcium: 9.5 mg/dL (ref 8.4–10.5)
Chloride: 103 mEq/L (ref 96–112)
Creatinine, Ser: 0.71 mg/dL (ref 0.50–1.10)
GFR calc Af Amer: >90 mL/min (ref >90)
GFR calc non Af Amer: 90 mL/min — ABNORMAL LOW (ref >90)
Glucose, Bld: 121 mg/dL — ABNORMAL HIGH (ref 70–99)
Potassium: 2.9 mEq/L — ABNORMAL LOW (ref 3.5–5.1)
Sodium: 140 mEq/L (ref 135–145)
Total Bilirubin: 0.9 mg/dL (ref 0.3–1.2)
Total Protein: 9.6 g/dL — ABNORMAL HIGH (ref 6.0–8.3)

## 2012-11-19 LAB — CBC WITH DIFFERENTIAL/PLATELET
Basophils Absolute: 0 10*3/uL (ref 0.0–0.1)
Basophils Relative: 0 % (ref 0–1)
Eosinophils Absolute: 0.1 10*3/uL (ref 0.0–0.7)
Eosinophils Relative: 1 % (ref 0–5)
HCT: 37.1 % (ref 36.0–46.0)
Hemoglobin: 12.3 g/dL (ref 12.0–15.0)
Lymphocytes Relative: 19 % (ref 12–46)
Lymphs Abs: 1.6 10*3/uL (ref 0.7–4.0)
MCH: 30.1 pg (ref 26.0–34.0)
MCHC: 33.2 g/dL (ref 30.0–36.0)
MCV: 90.9 fL (ref 78.0–100.0)
Monocytes Absolute: 0.3 10*3/uL (ref 0.1–1.0)
Monocytes Relative: 4 % (ref 3–12)
Neutro Abs: 6.5 10*3/uL (ref 1.7–7.7)
Neutrophils Relative %: 76 % (ref 43–77)
Platelets: 193 10*3/uL (ref 150–400)
RBC: 4.08 MIL/uL (ref 3.87–5.11)
RDW: 14.1 % (ref 11.5–15.5)
WBC: 8.6 10*3/uL (ref 4.0–10.5)

## 2012-11-19 LAB — LIPASE, BLOOD: Lipase: 48 U/L (ref 11–59)

## 2012-11-19 LAB — TROPONIN I: Troponin I: <0.3 ng/mL (ref <0.30)

## 2012-11-19 MED ORDER — POTASSIUM CHLORIDE CRYS ER 20 MEQ PO TBCR
40.0000 meq | EXTENDED_RELEASE_TABLET | Freq: Once | ORAL | Status: AC
  Administered 2012-11-19: 40 meq via ORAL
  Filled 2012-11-19: qty 2

## 2012-11-19 MED ORDER — SODIUM CHLORIDE 0.9 % IV SOLN
Freq: Once | INTRAVENOUS | Status: AC
  Administered 2012-11-19: 01:00:00 via INTRAVENOUS

## 2012-11-19 MED ORDER — MORPHINE SULFATE 4 MG/ML IJ SOLN
4.0000 mg | Freq: Once | INTRAMUSCULAR | Status: AC
  Administered 2012-11-19: 4 mg via INTRAVENOUS
  Filled 2012-11-19: qty 1

## 2012-11-19 NOTE — ED Provider Notes (Signed)
History    CSN: [id number] Arrival date & time 11/18/12  2332  First MD Initiated Contact with Patient 11/18/12 2339     Chief Complaint  Patient presents with  . Abdominal Pain   (Consider location/radiation/quality/duration/timing/severity/associated sxs/prior Treatment) HPI Comments: 62 year old female with PMH of HIV who presents for epigastric pain x5 hours. Patient states the pain radiates to her right and left upper abdomen around to her back it is about aggravating or alleviating factors. She describes the pain as sharp and constant. Patient admits to associated chest pain as well as nausea with one episode of nonbloody, nonbilious emesis. Patient denies diarrhea, melena, and hematochezia. Last bowel movement was this morning. Patient denies having pain like this in the past. Patient pacing around the room and very fidgety.   ID physician - Dr. Judyann Munson  Patient is a 62 y.o. female presenting with abdominal pain. The history is provided by the patient. No language interpreter was used.  Abdominal Pain This is a new problem. The current episode started today. The problem occurs constantly. The problem has been gradually worsening. Associated symptoms include abdominal pain, chest pain, nausea and vomiting (NB/NB x 1). Pertinent negatives include no diaphoresis, fever, numbness, urinary symptoms or weakness. Nothing aggravates the symptoms. She has tried nothing for the symptoms. Improvement on treatment: N/A.   Past Medical History  Diagnosis Date  . HIV infection   . Osteoporosis    Past Surgical History  Procedure Laterality Date  . Ectopic pregnancy surgery     Family History  Problem Relation Age of Onset  . Diabetes Mother   . Hypertension Mother    History  Substance Use Topics  . Smoking status: Never Smoker   . Smokeless tobacco: Never Used  . Alcohol Use: 1.5 oz/week    3 drink(s) per week     Comment: wine or [place] alcohol   OB History   Grav Para  Term Preterm Abortions TAB SAB Ect Mult Living   5 3 3  0 2 1 0 1 0 2     Review of Systems  Constitutional: Negative for fever and diaphoresis.  Respiratory: Negative for shortness of breath.   Cardiovascular: Positive for chest pain.  Gastrointestinal: Positive for nausea, vomiting (NB/NB x 1) and abdominal pain. Negative for diarrhea and blood in stool.  Genitourinary: Negative for dysuria and hematuria.  Neurological: Negative for weakness and numbness.  All other systems reviewed and are negative.   Allergies  Dapsone; Retrovir; and Sulfamethoxazole w-trimethoprim  Home Medications   Current Outpatient Rx  Name  Route  Sig  Dispense  Refill  . amLODipine (NORVASC) 10 MG tablet   Oral   Take 1 tablet (10 mg total) by mouth daily.   90 tablet   3   . Calcium Carbonate-Vitamin D (CALCIUM 600-D) 600-400 MG-UNIT per tablet   Oral   Take 1 tablet by mouth 2 (two) times daily.   100 tablet   3   . diclofenac (VOLTAREN) 75 MG EC tablet   Oral   Take 1 tablet by mouth at bedtime.         Marland Kitchen HYDROcodone-ibuprofen (VICOPROFEN) 7.5-200 MG per tablet   Oral   Take 1 tablet by mouth every 8 (eight) hours as needed for pain.   90 tablet   0   . zolpidem (AMBIEN) 10 MG tablet   Oral   Take 1 tablet (10 mg total) by mouth at bedtime as needed for sleep.   30  tablet   2   . abacavir-lamiVUDine (EPZICOM) 600-300 MG per tablet   Oral   Take 1 tablet by mouth daily.   30 tablet   11   . dolutegravir (TIVICAY) 50 MG tablet   Oral   Take 1 tablet (50 mg total) by mouth daily.   30 tablet   11    BP 153/81  Pulse 69  Temp(Src) 98.3 F (36.8 C) (Oral)  SpO2 100%  Physical Exam  Nursing note and vitals reviewed. Constitutional: She is oriented to person, place, and time.  HENT:  Head: Normocephalic and atraumatic.  Right Ear: External ear normal.  Left Ear: External ear normal.  Eyes: Conjunctivae and EOM are normal. No scleral icterus.  Neck: Normal range of  motion.  Cardiovascular: Normal rate, regular rhythm and normal heart sounds.   Pulmonary/Chest: Breath sounds normal. No respiratory distress. She has no wheezes. She has no rales.  Abdominal: Soft. She exhibits no distension and no mass. There is tenderness (epigastric) in the epigastric area. There is no rebound, no guarding, no CVA tenderness, no tenderness at McBurney's point and negative Murphy's sign.    Neurological: She is alert and oriented to person, place, and time.  Skin: Skin is warm and dry. No rash noted. No erythema. No pallor.  Psychiatric: She has a normal mood and affect.    ED Course  Procedures (including critical care time) Labs Reviewed  COMPREHENSIVE METABOLIC PANEL - Abnormal; Notable for the following:    Potassium 2.9 (*)    Glucose, Bld 121 (*)    Total Protein 9.6 (*)    GFR calc non Af Amer 90 (*)    All other components within normal limits  URINALYSIS, ROUTINE W REFLEX MICROSCOPIC - Abnormal; Notable for the following:    APPearance CLOUDY (*)    All other components within normal limits  CBC WITH DIFFERENTIAL  TROPONIN I  LIPASE, BLOOD    Date: 11/19/2012  Rate: 72  Rhythm: sinus tachycardia  QRS Axis: normal  Intervals: normal  ST/T Wave abnormalities: nonspecific ST changes  Conduction Disutrbances:none  Narrative Interpretation: NSR without STEMI or ischemic changes  Old EKG Reviewed: none available I have personally reviewed and interpreted this EKG  Dg Chest 2 View  11/19/2012   *RADIOLOGY REPORT*  Clinical Data: Short of breath.  Chest pain.  CHEST - 2 VIEW  Comparison: 06/27/2010 and 09/25/2008.  Findings: Tortuous thoracic aorta.  Cardiopericardial silhouette within normal limits. Monitoring leads are projected over the chest.  No airspace disease.  No pleural effusion.  Thoracic aorta configuration is unchanged compared to prior.  IMPRESSION: No interval change or acute cardiopulmonary disease.   Original Report Authenticated By:  Andreas Newport, M.D.   US Abdomen Complete  11/19/2012   *RADIOLOGY REPORT*  Clinical Data:  Right upper quadrant pain.  COMPLETE ABDOMINAL ULTRASOUND  Comparison:  None.  Findings:  Gallbladder:  Cholelithiasis is present.  The largest stone is 8 mm.  No sonographic Murphy's sign.  No wall thickening.  Common bile duct:  8 mm.  This probably relates to prior passage of stone.  No common duct stone is identified.  Liver:  Mild intrahepatic biliary ductal dilation.  No mass lesion.  IVC:  Appears normal.  Pancreas:  Suboptimally visualized.  Visualized portions appear normal.  Spleen:  11.1 cm.  Normal echotexture.  Right Kidney:  10.8 cm. 1 cm simple cyst.  No calculi or hydronephrosis.  Left Kidney:  10.5 cm. Normal echotexture.  Normal central sinus echo complex.  No calculi or hydronephrosis.  Abdominal aorta:  3 cm with anatomic tapering.  IMPRESSION:  1.  Cholelithiasis without cholecystitis. 2.  Prominence of the common bile duct probably relates to prior passage of stones.  If bilirubin is normal, no further evaluation is warranted. 3.  Right renal cyst. 4.  No acute abnormality.   Original Report Authenticated By: Andreas Newport, M.D.    1. Biliary colic   2. Cholelithiasis     MDM  Patient presents with epigastric pain with onset 5 hours ago. Physical exam as above with focal tenderness in the epigastric region; no peritoneal signs and negative Murphy sign by my interpretation. Patient afebrile and hemodynamically stable on arrival. Labs without leukocytosis, anemia, or abnormal liver or kidney function. Hypokalemia appreciated which was treated in the ED with K-Dur. Cardiac workup without significant findings; Troponin <0.30 and CXR without pneumonia, pneumothorax, pleural effusion, or other cardiopulmonary abnormality by my interpretation.   Ultrasound of the abdomen ordered to evaluate for gallstones; 8mm stone present in gallbladder without evidence of cholecystitis. Given work up, symptoms  most c/w biliary colic. Patient also states the pain has decreased from a 10/10 to 0/10 over course of ED stay after receiving 4mg  IV morphine. Patient appropriate for discharge with general surgery followup for further evaluation of symptoms. Patient also seen and examined by Dr. Bebe Shaggy who is in agreement with patient's stability for outpatient followup. Indications for ED return discussed with the patient who verbalizes comfort and understanding with this discharge plan with no unaddressed concerns.     Antony Madura, PA-C 11/21/12 2216

## 2012-11-19 NOTE — ED Provider Notes (Signed)
Pt with RUQ tenderness, will need US imaging She denies CP and she denies any SOB and denies pleuritic CP   Joya Gaskins, MD 11/19/12 0207

## 2012-11-22 ENCOUNTER — Telehealth: Payer: Self-pay | Admitting: Licensed Clinical Social Worker

## 2012-11-22 LAB — T-HELPER CELL (CD4) - (RCID CLINIC ONLY)
CD4 % Helper T Cell: 22 % — ABNORMAL LOW (ref 33–55)
CD4 T Cell Abs: 290 uL — ABNORMAL LOW (ref 400–2700)

## 2012-11-22 NOTE — Telephone Encounter (Signed)
Patient was seen at [facility] ED on 11/18/2012 with Gall Bladder stones and was referred to [facility] Dr. Ezzard Standing. I called and made her a referral and they will see her on 12/16/2012 at 9:15 am.

## 2012-11-23 NOTE — ED Provider Notes (Signed)
Medical screening examination/treatment/procedure(s) were conducted as a shared visit with non-physician practitioner(s) and myself.  I personally evaluated the patient during the encounter  Pt stable in the ED and can f/u as outpatient   Joya Gaskins, MD 11/23/12 1456

## 2012-12-16 ENCOUNTER — Ambulatory Visit (INDEPENDENT_AMBULATORY_CARE_PROVIDER_SITE_OTHER): Payer: Medicare Other | Admitting: Surgery

## 2013-02-03 ENCOUNTER — Encounter (INDEPENDENT_AMBULATORY_CARE_PROVIDER_SITE_OTHER): Payer: Self-pay | Admitting: Surgery

## 2013-02-03 ENCOUNTER — Ambulatory Visit (INDEPENDENT_AMBULATORY_CARE_PROVIDER_SITE_OTHER): Payer: Medicare Other | Admitting: Surgery

## 2013-02-03 VITALS — BP 129/76 | HR 62 | Temp 98.0°F | Resp 14 | Ht 60.0 in | Wt 147.4 lb

## 2013-02-03 DIAGNOSIS — K829 Disease of gallbladder, unspecified: Secondary | ICD-10-CM | POA: Insufficient documentation

## 2013-02-03 NOTE — Progress Notes (Signed)
Re:   Vanessa Beard DOB:   02-26-1951 MRN:   [id number]  ASSESSMENT AND PLAN: 1.  Gall stones.  She has had a single attack in July 2014, but has had no symptoms since then.  I discussed with the patient the indications and risks of gall bladder surgery.  The primary risks of gall bladder surgery include, but are not limited to, bleeding, infection, common bile duct injury, and open surgery.  There is also the risk that the patient may have continued symptoms after surgery.  However, the likelihood of improvement in symptoms and return to the patient's normal status is good. We discussed the typical post-operative recovery course. I tried to answer the patient's questions.  I gave the patient literature about gall bladder surgery.  At this time, she does not want to go undergo surgery.  She knows that if she has further symptoms she can be in touch with me to readdress potential surgery.  2.  HIV  Sees Dr. Ivar Drape.  HIV RNA Quant - 11/02/2012 - 49 (High) 3.  Hepatitis C (???)  The patient does not know why she has this diagnosis.  I cannot find a positive test in Epic, but Epic only goes back 2 years.  There may be an older test that I am not aware of. 4.  Hypertension 5.  L4 compression fracture 5.  Dr. Shirline Frees to do work up for [facility]  Chief Complaint  Patient presents with  . Abdominal Pain    gallstones   REFERRING PHYSICIAN: Judyann Munson, MD  HISTORY OF PRESENT ILLNESS: Vanessa Beard is a 62 y.o. (DOB: 27-Oct-1950)  AA  female whose primary care physician is Judyann Munson, MD and comes to me today for gall bladder disease.  She has had a single "attack" in July 2014 when she ate some [employer] hot sauce.  She had epigastric pain and indigestion and went to the [facility]. The patient went to the [facility] on 11/19/2012 with a 5 hour history of epigastric pain.  [place] on 11/19/2012 showed multiple gallstones.  She was referred here from the ER.  She has not discussed the diagnosis with  Dr. Drue Second. Prior GI history:  Her chart has that she has Hepatitis C - but she denies knowledge of this.   She has no history of stomach disease.  No history of liver disease.  No history of pancreas disease.  No history of colon disease.  She has not had a colonoscopy.  Past Medical History  Diagnosis Date  . HIV infection   . Osteoporosis     Past Surgical History  Procedure Laterality Date  . Ectopic pregnancy surgery    . Shoulder surgery Right     Current Outpatient Prescriptions  Medication Sig Dispense Refill  . abacavir-lamiVUDine (EPZICOM) 600-300 MG per tablet Take 1 tablet by mouth daily.  30 tablet  11  . amLODipine (NORVASC) 10 MG tablet Take 1 tablet (10 mg total) by mouth daily.  90 tablet  3  . diclofenac (VOLTAREN) 75 MG EC tablet Take 1 tablet by mouth at bedtime.      . dolutegravir (TIVICAY) 50 MG tablet Take 1 tablet (50 mg total) by mouth daily.  30 tablet  11  . HYDROcodone-acetaminophen (NORCO/VICODIN) 5-325 MG per tablet       . HYDROcodone-ibuprofen (VICOPROFEN) 7.5-200 MG per tablet Take 1 tablet by mouth every 8 (eight) hours as needed for pain.  90 tablet  0  . oxyCODONE (OXY  IR/ROXICODONE) 5 MG immediate release tablet       . oxyCODONE-acetaminophen (PERCOCET/ROXICET) 5-325 MG per tablet       . zolpidem (AMBIEN) 10 MG tablet Take 1 tablet (10 mg total) by mouth at bedtime as needed for sleep.  30 tablet  2  . Calcium Carbonate-Vitamin D (CALCIUM 600-D) 600-400 MG-UNIT per tablet Take 1 tablet by mouth 2 (two) times daily.  100 tablet  3   No current facility-administered medications for this visit.      Allergies  Allergen Reactions  . Dapsone Other (See Comments)    stomach  burning  . Retrovir [Zidovudine] Other (See Comments)    Changed skin color  . Sulfamethoxazole W-Trimethoprim Other (See Comments)    Flu like symptoms    REVIEW OF SYSTEMS: Skin:  No history of rash.  No history of abnormal moles. Infection:  HIV+.  Sees Dr. Ivar Drape.  HIV RNA Quant - 11/02/2012 - 49 (High).  Hepatitis C is on her chart, but she denies knowledge of this. Neurologic:  No history of stroke.  No history of seizure.  No history of headaches. Cardiac:  Hypertension. No history of heart disease.  No history of prior cardiac catheterization.  No history of seeing a cardiologist. Pulmonary:  Does not smoke cigarettes.  No asthma or bronchitis.  No OSA/CPAP.  Endocrine:  No diabetes. No thyroid disease. Gastrointestinal:  See HPI. Urologic:  No history of kidney stones.  No history of bladder infections. Musculoskeletal:  Repair right rotator cuff - 11/2012 - [place].  L3 compression fracture from fall - saw Dr. Edison Simon who said nothing needed to be done. Hematologic:  Seeing Dr. Shirline Frees for work up for [facility] Psycho-social:  The patient is oriented.   The patient has no obvious psychologic or social impairment to understanding our conversation and plan.  SOCIAL and FAMILY HISTORY: Separated. Her mother lives with her. She works as a Lawyer for Teacher, English as a foreign language.  She works for [employer]. She has two sons:  22 and 18.  PHYSICAL EXAM: BP 129/76  Pulse 62  Temp(Src) 98 F (36.7 C) (Temporal)  Resp 14  Ht 5' (1.524 m)  Wt 147 lb 6.4 oz (66.86 kg)  BMI 28.79 kg/m2  General: AA F who is alert and generally healthy appearing.  She has a hair piece on. HEENT: Normal. Pupils equal. Neck: Supple. No mass.  No thyroid mass. Lymph Nodes:  No supraclavicular or cervical nodes. Lungs: Clear to auscultation and symmetric breath sounds. Heart:  RRR. No murmur or rub.  Abdomen: Soft. No mass. No tenderness. No hernia. Normal bowel sounds.  Has pfannenstiel incision from C - section and tubal pregnancy. Rectal: Not done. Extremities:  Good strength and ROM  in upper and lower extremities.  Discoloration of the skin at her ankles. Neurologic:  Grossly intact to motor and sensory function. Psychiatric: Has normal mood and affect. Behavior is  normal.   DATA REVIEWED: Epic notes and records.  Ovidio Kin, MD,  [facility] [address]  [address]   [place], [place]    [address] Phone:  [phone] FAX:  [phone]

## 2013-02-08 ENCOUNTER — Other Ambulatory Visit: Payer: Medicare Other

## 2013-02-11 ENCOUNTER — Telehealth: Payer: Self-pay | Admitting: Internal Medicine

## 2013-02-11 ENCOUNTER — Emergency Department (HOSPITAL_COMMUNITY): Payer: Medicare Other

## 2013-02-11 ENCOUNTER — Emergency Department (HOSPITAL_COMMUNITY)
Admission: EM | Admit: 2013-02-11 | Discharge: 2013-02-11 | Disposition: A | Discharge status: Home / Self Care | Payer: Medicare Other | Attending: Emergency Medicine | Admitting: Emergency Medicine

## 2013-02-11 ENCOUNTER — Other Ambulatory Visit: Payer: Medicaid Other

## 2013-02-11 ENCOUNTER — Encounter (HOSPITAL_COMMUNITY): Payer: Self-pay | Admitting: Emergency Medicine

## 2013-02-11 DIAGNOSIS — Z9889 Other specified postprocedural states: Secondary | ICD-10-CM | POA: Insufficient documentation

## 2013-02-11 DIAGNOSIS — Z21 Asymptomatic human immunodeficiency virus [HIV] infection status: Secondary | ICD-10-CM | POA: Insufficient documentation

## 2013-02-11 DIAGNOSIS — M810 Age-related osteoporosis without current pathological fracture: Secondary | ICD-10-CM | POA: Insufficient documentation

## 2013-02-11 DIAGNOSIS — Z79899 Other long term (current) drug therapy: Secondary | ICD-10-CM | POA: Insufficient documentation

## 2013-02-11 DIAGNOSIS — B20 Human immunodeficiency virus [HIV] disease: Secondary | ICD-10-CM

## 2013-02-11 DIAGNOSIS — R110 Nausea: Secondary | ICD-10-CM | POA: Insufficient documentation

## 2013-02-11 DIAGNOSIS — K829 Disease of gallbladder, unspecified: Secondary | ICD-10-CM

## 2013-02-11 HISTORY — DX: Disease of gallbladder, unspecified: K82.9

## 2013-02-11 LAB — CBC WITH DIFFERENTIAL/PLATELET
Basophils Absolute: 0 10*3/uL (ref 0.0–0.1)
Basophils Absolute: 0 10*3/uL (ref 0.0–0.1)
Basophils Relative: 0 % (ref 0–1)
Basophils Relative: 0 % (ref 0–1)
Eosinophils Absolute: 0.1 10*3/uL (ref 0.0–0.7)
Eosinophils Absolute: 0 10*3/uL (ref 0.0–0.7)
Eosinophils Relative: 2 % (ref 0–5)
Eosinophils Relative: 1 % (ref 0–5)
HCT: 36.2 % (ref 36.0–46.0)
HCT: 40 % (ref 36.0–46.0)
Hemoglobin: 12.2 g/dL (ref 12.0–15.0)
Hemoglobin: 13 g/dL (ref 12.0–15.0)
Lymphocytes Relative: 42 % (ref 12–46)
Lymphocytes Relative: 29 % (ref 12–46)
Lymphs Abs: 1.2 10*3/uL (ref 0.7–4.0)
Lymphs Abs: 1.9 10*3/uL (ref 0.7–4.0)
MCH: 30 pg (ref 26.0–34.0)
MCH: 29.8 pg (ref 26.0–34.0)
MCHC: 33.7 g/dL (ref 30.0–36.0)
MCHC: 32.5 g/dL (ref 30.0–36.0)
MCV: 89.2 fL (ref 78.0–100.0)
MCV: 91.7 fL (ref 78.0–100.0)
Monocytes Absolute: 0.3 10*3/uL (ref 0.1–1.0)
Monocytes Absolute: 0.4 10*3/uL (ref 0.1–1.0)
Monocytes Relative: 9 % (ref 3–12)
Monocytes Relative: 6 % (ref 3–12)
Neutro Abs: 1.3 10*3/uL — ABNORMAL LOW (ref 1.7–7.7)
Neutro Abs: 4.2 10*3/uL (ref 1.7–7.7)
Neutrophils Relative %: 47 % (ref 43–77)
Neutrophils Relative %: 65 % (ref 43–77)
Platelets: 152 10*3/uL (ref 150–400)
Platelets: 168 10*3/uL (ref 150–400)
RBC: 4.06 MIL/uL (ref 3.87–5.11)
RBC: 4.36 MIL/uL (ref 3.87–5.11)
RDW: 14 % (ref 11.5–15.5)
RDW: 13.4 % (ref 11.5–15.5)
WBC: 2.9 10*3/uL — ABNORMAL LOW (ref 4.0–10.5)
WBC: 6.5 10*3/uL (ref 4.0–10.5)

## 2013-02-11 LAB — COMPLETE METABOLIC PANEL WITH GFR
ALT: 24 U/L (ref 0–35)
AST: 39 U/L — ABNORMAL HIGH (ref 0–37)
Albumin: 3.7 g/dL (ref 3.5–5.2)
Alkaline Phosphatase: 84 U/L (ref 39–117)
BUN: 10 mg/dL (ref 6–23)
CO2: 29 mEq/L (ref 19–32)
Calcium: 9.1 mg/dL (ref 8.4–10.5)
Chloride: 102 mEq/L (ref 96–112)
Creat: 0.7 mg/dL (ref 0.50–1.10)
GFR, Est African American: >89 mL/min
GFR, Est Non African American: >89 mL/min
Glucose, Bld: 84 mg/dL (ref 70–99)
Potassium: 3.8 mEq/L (ref 3.5–5.3)
Sodium: 139 mEq/L (ref 135–145)
Total Bilirubin: 0.7 mg/dL (ref 0.3–1.2)
Total Protein: 8.5 g/dL — ABNORMAL HIGH (ref 6.0–8.3)

## 2013-02-11 LAB — COMPREHENSIVE METABOLIC PANEL
ALT: 29 U/L (ref 0–35)
AST: 59 U/L — ABNORMAL HIGH (ref 0–37)
Albumin: 3.8 g/dL (ref 3.5–5.2)
Alkaline Phosphatase: 98 U/L (ref 39–117)
BUN: 12 mg/dL (ref 6–23)
CO2: 24 mEq/L (ref 19–32)
Calcium: 9.6 mg/dL (ref 8.4–10.5)
Chloride: 97 mEq/L (ref 96–112)
Creatinine, Ser: 0.66 mg/dL (ref 0.50–1.10)
GFR calc Af Amer: >90 mL/min (ref >90)
GFR calc non Af Amer: >90 mL/min (ref >90)
Glucose, Bld: 108 mg/dL — ABNORMAL HIGH (ref 70–99)
Potassium: 3.4 mEq/L — ABNORMAL LOW (ref 3.5–5.1)
Sodium: 134 mEq/L — ABNORMAL LOW (ref 135–145)
Total Bilirubin: 0.7 mg/dL (ref 0.3–1.2)
Total Protein: 9.5 g/dL — ABNORMAL HIGH (ref 6.0–8.3)

## 2013-02-11 LAB — T-HELPER CELL (CD4) - (RCID CLINIC ONLY)
CD4 % Helper T Cell: 28 % — ABNORMAL LOW (ref 33–55)
CD4 T Cell Abs: 370 /uL — ABNORMAL LOW (ref 400–2700)

## 2013-02-11 LAB — LIPASE, BLOOD: Lipase: 40 U/L (ref 11–59)

## 2013-02-11 MED ORDER — HYDROCODONE-ACETAMINOPHEN 5-325 MG PO TABS: 2.0000 | ORAL_TABLET | Freq: Four times a day (QID) | ORAL | Status: DC | PRN

## 2013-02-11 MED ORDER — SODIUM CHLORIDE 0.9 % IV SOLN
1000.0000 mL | Freq: Once | INTRAVENOUS | Status: AC
  Administered 2013-02-11: 1000 mL via INTRAVENOUS

## 2013-02-11 MED ORDER — METOCLOPRAMIDE HCL 10 MG PO TABS: 10.0000 mg | ORAL_TABLET | Freq: Four times a day (QID) | ORAL | Status: DC | PRN

## 2013-02-11 MED ORDER — ONDANSETRON HCL 4 MG/2ML IJ SOLN
4.0000 mg | Freq: Once | INTRAMUSCULAR | Status: AC
  Administered 2013-02-11: 4 mg via INTRAVENOUS
  Filled 2013-02-11: qty 2

## 2013-02-11 MED ORDER — HYDROMORPHONE HCL PF 1 MG/ML IJ SOLN
1.0000 mg | Freq: Once | INTRAMUSCULAR | Status: AC
  Administered 2013-02-11: 1 mg via INTRAVENOUS
  Filled 2013-02-11: qty 1

## 2013-02-11 NOTE — Telephone Encounter (Signed)
lvm for pt that 9.29.14 appt cx and moved to 10.14.14.Marland KitchenMarland Kitchen

## 2013-02-11 NOTE — ED Notes (Signed)
Pt has dx of gallstones and is having a flare up. Pt is obvious distress.

## 2013-02-11 NOTE — ED Provider Notes (Signed)
CSN: [id number]     Arrival date & time 02/11/13  2005 History   First MD Initiated Contact with Patient 02/11/13 2055     Chief Complaint  Patient presents with  . Abdominal Pain    Gallstones   (Consider location/radiation/quality/duration/timing/severity/associated sxs/prior Treatment) HPI Patient presents with abdominal pain.  This episode began approximately one hour prior to my evaluation.  Pain began after the patient ate dinner.  Since onset pain has been severe, epigastric, nonradiating.  There is associated nausea, no vomiting, no diarrhea. Patient was in her usual state of health prior to the onset of pain. Patient has history of biliary disease, and was evaluated one week ago by surgery as an outpatient. She has multiple medical problems, including HIV, which she states is well controlled Past Medical History  Diagnosis Date  . HIV infection   . Osteoporosis   . Gall bladder disease \   Past Surgical History  Procedure Laterality Date  . Ectopic pregnancy surgery    . Shoulder surgery Right    Family History  Problem Relation Age of Onset  . Diabetes Mother   . Hypertension Mother    History  Substance Use Topics  . Smoking status: Never Smoker   . Smokeless tobacco: Never Used  . Alcohol Use: 1.5 oz/week    3 drink(s) per week     Comment: wine or Rosier alcohol   OB History   Grav Para Term Preterm Abortions TAB SAB Ect Mult Living   5 3 3  0 2 1 0 1 0 2     Review of Systems  Constitutional:       Per HPI, otherwise negative  HENT:       Per HPI, otherwise negative  Respiratory:       Per HPI, otherwise negative  Cardiovascular:       Per HPI, otherwise negative  Gastrointestinal: Positive for nausea and abdominal pain. Negative for vomiting.  Endocrine:       Negative aside from HPI  Genitourinary:       Neg aside from HPI   Musculoskeletal:       Per HPI, otherwise negative  Skin: Negative.   Neurological: Negative for syncope.     Allergies  Dapsone; Retrovir; and Sulfamethoxazole w-trimethoprim  Home Medications   Current Outpatient Rx  Name  Route  Sig  Dispense  Refill  . abacavir-lamiVUDine (EPZICOM) 600-300 MG per tablet   Oral   Take 1 tablet by mouth daily.   30 tablet   11   . amLODipine (NORVASC) 10 MG tablet   Oral   Take 1 tablet (10 mg total) by mouth daily.   90 tablet   3   . EXPIRED: Calcium Carbonate-Vitamin D (CALCIUM 600-D) 600-400 MG-UNIT per tablet   Oral   Take 1 tablet by mouth 2 (two) times daily.   100 tablet   3   . diclofenac (VOLTAREN) 75 MG EC tablet   Oral   Take 1 tablet by mouth at bedtime.         . dolutegravir (TIVICAY) 50 MG tablet   Oral   Take 1 tablet (50 mg total) by mouth daily.   30 tablet   11   . HYDROcodone-acetaminophen (NORCO/VICODIN) 5-325 MG per tablet               . HYDROcodone-ibuprofen (VICOPROFEN) 7.5-200 MG per tablet   Oral   Take 1 tablet by mouth every 8 (eight) hours as needed  for pain.   90 tablet   0   . oxyCODONE (OXY IR/ROXICODONE) 5 MG immediate release tablet               . oxyCODONE-acetaminophen (PERCOCET/ROXICET) 5-325 MG per tablet               . zolpidem (AMBIEN) 10 MG tablet   Oral   Take 1 tablet (10 mg total) by mouth at bedtime as needed for sleep.   30 tablet   2    BP 155/102  Pulse 88  Temp(Src) 98.3 F (36.8 C) (Oral)  Resp 20  Ht 5' (1.524 m)  Wt 145 lb (65.772 kg)  BMI 28.32 kg/m2  SpO2 100% Physical Exam  Nursing note and vitals reviewed. Constitutional: She is oriented to person, place, and time. She appears well-developed and well-nourished. No distress.  HENT:  Head: Normocephalic and atraumatic.  Eyes: Conjunctivae and EOM are normal.  Cardiovascular: Normal rate and regular rhythm.   Pulmonary/Chest: Effort normal and breath sounds normal. No stridor. No respiratory distress.  Abdominal: Normal appearance. She exhibits no distension. There is tenderness in the  epigastric area. There is guarding. There is no rigidity.  Musculoskeletal: She exhibits no edema.  Neurological: She is alert and oriented to person, place, and time. No cranial nerve deficit.  Skin: Skin is warm and dry.  Psychiatric: She has a normal mood and affect.    ED Course  Procedures (including critical care time) Labs Review Labs Reviewed  CBC WITH DIFFERENTIAL  COMPREHENSIVE METABOLIC PANEL  LIPASE, BLOOD  URINALYSIS, ROUTINE W REFLEX MICROSCOPIC   Imaging Review No results found. I reviewed the patient's chart, including [facility] [place] evaluation one week ago.  11:00 PM Patient sitting upright.  We discussed results.  I reviewed the [place] results and they are notable for stones, abnormal.  MDM  No diagnosis found.  This patient with previously diagnosed gallstones presents with with acute abdominal pain, consistent with biliary colic.  The patient's labs are largely reassuring, she is afebrile, and awake and alert, in no distress.  Patient is afebrile, ultrasound does not demonstrate acute cholecystitis.  Patient improved substantially here, and was d/c to f/u w surgery this week.   Gerhard Munch, MD 02/11/13 2302

## 2013-02-14 ENCOUNTER — Other Ambulatory Visit: Payer: Medicare Other | Admitting: Lab

## 2013-02-14 ENCOUNTER — Ambulatory Visit: Payer: Medicare Other | Admitting: Internal Medicine

## 2013-02-14 LAB — HIV-1 RNA QUANT-NO REFLEX-BLD
HIV 1 RNA Quant: <20 copies/mL (ref <20)
HIV-1 RNA Quant, Log: <1.3 {Log} (ref <1.30)

## 2013-02-21 ENCOUNTER — Ambulatory Visit: Payer: Medicare Other | Admitting: Internal Medicine

## 2013-02-22 ENCOUNTER — Other Ambulatory Visit: Payer: Self-pay | Admitting: *Deleted

## 2013-02-22 DIAGNOSIS — G4700 Insomnia, unspecified: Secondary | ICD-10-CM

## 2013-02-22 MED ORDER — ZOLPIDEM TARTRATE 10 MG PO TABS
10.0000 mg | ORAL_TABLET | Freq: Every evening | ORAL | Status: DC | PRN
Start: 2013-02-22 — End: 2013-06-09

## 2013-02-22 MED ORDER — ZOLPIDEM TARTRATE 10 MG PO TABS: 10.0000 mg | ORAL_TABLET | Freq: Every evening | ORAL | Status: DC | PRN

## 2013-03-01 ENCOUNTER — Encounter: Payer: Self-pay | Admitting: Internal Medicine

## 2013-03-01 ENCOUNTER — Ambulatory Visit: Payer: Medicare Other | Admitting: Lab

## 2013-03-01 ENCOUNTER — Ambulatory Visit (HOSPITAL_BASED_OUTPATIENT_CLINIC_OR_DEPARTMENT_OTHER): Payer: Medicare Other | Admitting: Internal Medicine

## 2013-03-01 ENCOUNTER — Ambulatory Visit (INDEPENDENT_AMBULATORY_CARE_PROVIDER_SITE_OTHER): Payer: Medicare Other | Admitting: Internal Medicine

## 2013-03-01 ENCOUNTER — Other Ambulatory Visit: Payer: Self-pay | Admitting: *Deleted

## 2013-03-01 ENCOUNTER — Other Ambulatory Visit (HOSPITAL_BASED_OUTPATIENT_CLINIC_OR_DEPARTMENT_OTHER): Payer: Medicare Other | Admitting: Lab

## 2013-03-01 VITALS — BP 127/76 | HR 63 | Temp 97.4°F | Wt 146.0 lb

## 2013-03-01 VITALS — BP 160/97 | HR 63 | Temp 98.0°F | Resp 18 | Ht 60.0 in | Wt 146.0 lb

## 2013-03-01 DIAGNOSIS — Z23 Encounter for immunization: Secondary | ICD-10-CM

## 2013-03-01 DIAGNOSIS — D472 Monoclonal gammopathy: Secondary | ICD-10-CM

## 2013-03-01 DIAGNOSIS — D696 Thrombocytopenia, unspecified: Secondary | ICD-10-CM

## 2013-03-01 DIAGNOSIS — B20 Human immunodeficiency virus [HIV] disease: Secondary | ICD-10-CM

## 2013-03-01 LAB — CBC WITH DIFFERENTIAL/PLATELET
BASO%: 0.7 % (ref 0.0–2.0)
Basophils Absolute: 0 10*3/uL (ref 0.0–0.1)
EOS%: 0.9 % (ref 0.0–7.0)
Eosinophils Absolute: 0 10*3/uL (ref 0.0–0.5)
HCT: 39.1 % (ref 34.8–46.6)
HGB: 12.6 g/dL (ref 11.6–15.9)
LYMPH%: 27.1 % (ref 14.0–49.7)
MCH: 30 pg (ref 25.1–34.0)
MCHC: 32.3 g/dL (ref 31.5–36.0)
MCV: 92.7 fL (ref 79.5–101.0)
MONO#: 0.2 10*3/uL (ref 0.1–0.9)
MONO%: 4.7 % (ref 0.0–14.0)
NEUT#: 3.1 10*3/uL (ref 1.5–6.5)
NEUT%: 66.6 % (ref 38.4–76.8)
Platelets: 136 10*3/uL — ABNORMAL LOW (ref 145–400)
RBC: 4.22 10*6/uL (ref 3.70–5.45)
RDW: 13.6 % (ref 11.2–14.5)
WBC: 4.7 10*3/uL (ref 3.9–10.3)
lymph#: 1.3 10*3/uL (ref 0.9–3.3)

## 2013-03-01 LAB — COMPREHENSIVE METABOLIC PANEL (CC13)
ALT: 21 U/L (ref 0–55)
AST: 36 U/L — ABNORMAL HIGH (ref 5–34)
Albumin: 3.4 g/dL — ABNORMAL LOW (ref 3.5–5.0)
Alkaline Phosphatase: 85 U/L (ref 40–150)
Anion Gap: 7 mEq/L (ref 3–11)
BUN: 11.6 mg/dL (ref 7.0–26.0)
CO2: 25 mEq/L (ref 22–29)
Calcium: 9.3 mg/dL (ref 8.4–10.4)
Chloride: 107 mEq/L (ref 98–109)
Creatinine: 0.8 mg/dL (ref 0.6–1.1)
Glucose: 100 mg/dl (ref 70–140)
Potassium: 3.7 mEq/L (ref 3.5–5.1)
Sodium: 139 mEq/L (ref 136–145)
Total Bilirubin: 0.64 mg/dL (ref 0.20–1.20)
Total Protein: 9.3 g/dL — ABNORMAL HIGH (ref 6.4–8.3)

## 2013-03-01 LAB — LACTATE DEHYDROGENASE (CC13): LDH: 177 U/L (ref 125–245)

## 2013-03-01 MED ORDER — ABACAVIR-DOLUTEGRAVIR-LAMIVUD 600-50-300 MG PO TABS: 1.0000 | ORAL_TABLET | Freq: Every day | ORAL | Status: DC

## 2013-03-01 NOTE — Progress Notes (Signed)
[facility] CLIN[facility] NOTE  RFV: routine Subjective:    Patient ID: Vanessa Beard, female    DOB: November 15, 1950, 62 y.o.   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 She reports doing well. No recent illnesses. Taking medications as instructed. No missing doses  Current Outpatient Prescriptions on File Prior to Visit  Medication Sig Dispense Refill  . abacavir-lamiVUDine (EPZICOM) 600-300 MG per tablet Take 1 tablet by mouth daily.  30 tablet  11  . amLODipine (NORVASC) 10 MG tablet Take 1 tablet (10 mg total) by mouth daily.  90 tablet  3  . diclofenac (VOLTAREN) 75 MG EC tablet Take 1 tablet by mouth at bedtime.      . dolutegravir (TIVICAY) 50 MG tablet Take 1 tablet (50 mg total) by mouth daily.  30 tablet  11  . HYDROcodone-acetaminophen (NORCO/VICODIN) 5-325 MG per tablet Take 2 tablets by mouth every 6 (six) hours as needed for pain.  15 tablet  0  . metoCLOPramide (REGLAN) 10 MG tablet Take 1 tablet (10 mg total) by mouth every 6 (six) hours as needed (nausea).  20 tablet  0  . zolpidem (AMBIEN) 10 MG tablet Take 1 tablet (10 mg total) by mouth at bedtime as needed for sleep.  30 tablet  2   No current facility-administered medications on file prior to visit.   Active Ambulatory Problems    Diagnosis Date Noted  . Human Immunodeficiency Virus (HIV) Disease 08/04/2006  . HEPATITIS C, CHRONIC 12/19/2008  . PNEUMOCYSTIS PNEUMONIA 05/07/2007  . HYPERTENSION 05/07/2007  . ALLERGIC RHINITIS 07/09/2007  . DENTAL CARIES 05/07/2007  . LEUKORRHEA 04/07/2008  . BACK PAIN, LUMBAR 11/10/2007  . INSOMNIA 05/07/2007  . FATIGUE 04/07/2008  . COUGH 07/18/2009  . PAP SMEAR, LGSIL, ABNORMAL 12/21/2009  . PNEUMONIA, HX OF 05/07/2007  . HERPES ZOSTER, HX OF 05/07/2007  . Osteopenia 02/03/2011  . Hyperparathyroidism 02/10/2011  . Sore throat 04/02/2011  . Compression fracture of L3 lumbar vertebra 07/01/2011  .  Thyroid dysfunction 07/01/2011  . Anemia 07/01/2011  . 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  . Gall bladder disease 02/03/2013   Resolved Ambulatory Problems    Diagnosis Date Noted  . No Resolved Ambulatory Problems   Past Medical History  Diagnosis Date  . HIV infection   . Osteoporosis       Review of Systems Review of Systems  Constitutional: Negative for fever, chills, diaphoresis, activity change, appetite change, fatigue and unexpected weight change.  HENT: Negative for congestion, sore throat, rhinorrhea, sneezing, trouble swallowing and sinus pressure.  Eyes: Negative for photophobia and visual disturbance.  Respiratory: Negative for cough, chest tightness, shortness of breath, wheezing and stridor.  Cardiovascular: Negative for chest pain, palpitations and leg swelling.  Gastrointestinal: Negative for nausea, vomiting, abdominal pain, diarrhea, constipation, blood in stool, abdominal distention and anal bleeding.  Genitourinary: Negative for dysuria, hematuria, flank pain and difficulty urinating.  Musculoskeletal: Negative for myalgias, back pain, joint swelling, arthralgias and gait problem.  Skin: Negative for color change, pallor, rash and wound.  Neurological: Negative for dizziness, tremors, weakness and light-headedness.  Hematological: Negative for adenopathy. Does not bruise/bleed easily.  Psychiatric/Behavioral: Negative for behavioral problems, confusion, sleep disturbance, dysphoric mood, decreased concentration and agitation.       Objective:   Physical Exam BP 127/76  Pulse 63  Temp(Src) 97.4 F (36.3 C) (Oral)  Wt 146 lb (66.225 kg)  BMI 28.51 kg/m2 Physical Exam  Constitutional: is oriented to person, place, and time.  appears well-developed and well-nourished. No distress.  HENT:  Mouth/Throat: Oropharynx is clear and moist. No oropharyngeal exudate.  Cardiovascular: Normal rate, regular rhythm and normal heart  sounds. Exam reveals no gallop and no friction rub.  No murmur heard.  Pulmonary/Chest: Effort normal and breath sounds normal. No respiratory distress.  no wheezes.  Lymphadenopathy:  no cervical adenopathy.  Skin: Skin is warm and dry. No rash noted. No erythema.        Assessment & Plan:  HIV = continue with current medications. Will continue with checking labs every 6 months  Health maintenance = in the next coming year, will need to schedule mammo and pap smear

## 2013-03-01 NOTE — Progress Notes (Signed)
[facility] [phone]   Fax:([phone]) [phone]  OFFICE PROGRESS NOTE  Judyann Munson, MD 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  2) HIV   PRIOR THERAPY: None.   CURRENT THERAPY: Observation.  INTERVAL HISTORY: Vanessa Beard 62 y.o. female returns to the clinic today for routine three-month followup visit. The patient is feeling fine today with no specific complaints. She denied having any significant fatigue or weakness. She denied having any aching pain. The patient denied having any significant weight loss or night sweats. She has no chest pain, shortness breath, cough or hemoptysis. The patient has repeat CBC, comprehensive metabolic panel, LDH and myeloma panel performed earlier today and she is here for evaluation and discussion of her lab results.  MEDICAL HISTORY: Past Medical History  Diagnosis Date  . HIV infection   . Osteoporosis   . Gall bladder disease \    ALLERGIES:  is allergic to dapsone; retrovir; and sulfamethoxazole-trimethoprim.  MEDICATIONS:  Current Outpatient Prescriptions  Medication Sig Dispense Refill  . Abacavir-Dolutegravir-Lamivud 600-50-300 MG TABS Take 1 tablet by mouth daily.  30 tablet  11  . amLODipine (NORVASC) 10 MG tablet Take 1 tablet (10 mg total) by mouth daily.  90 tablet  3  . HYDROcodone-acetaminophen (NORCO/VICODIN) 5-325 MG per tablet Take 2 tablets by mouth every 6 (six) hours as needed for pain.  15 tablet  0  . metoCLOPramide (REGLAN) 10 MG tablet Take 1 tablet (10 mg total) by mouth every 6 (six) hours as needed (nausea).  20 tablet  0  . zolpidem (AMBIEN) 10 MG tablet Take 1 tablet (10 mg total) by mouth at bedtime as needed for sleep.  30 tablet  2   No current facility-administered medications for this visit.    SURGICAL HISTORY:  Past Surgical History  Procedure Laterality  Date  . Ectopic pregnancy surgery    . Shoulder surgery Right     REVIEW OF SYSTEMS:  A comprehensive review of systems was negative.   PHYSICAL EXAMINATION: General appearance: alert, cooperative and no distress Head: Normocephalic, without obvious abnormality, atraumatic Neck: no adenopathy, no JVD, supple, symmetrical, trachea midline and thyroid not enlarged, symmetric, no tenderness/mass/nodules Lymph nodes: Cervical, supraclavicular, and axillary nodes normal. Resp: clear to auscultation bilaterally Back: symmetric, no curvature. ROM normal. No CVA tenderness. Cardio: regular rate and rhythm, S1, S2 normal, no murmur, click, rub or gallop GI: soft, non-tender; bowel sounds normal; no masses,  no organomegaly Extremities: extremities normal, atraumatic, no cyanosis or edema  ECOG PERFORMANCE STATUS: 0 - Asymptomatic  Blood pressure 160/97, pulse 63, temperature 98 F (36.7 C), temperature source Oral, resp. rate 18, height 5' (1.524 m), weight 146 lb (66.225 kg).  LABORATORY DATA: Lab Results  Component Value Date   WBC 4.7 03/01/2013   HGB 12.6 03/01/2013   HCT 39.1 03/01/2013   MCV 92.7 03/01/2013   PLT 136* 03/01/2013      Chemistry      Component Value Date/Time   NA 134* 02/11/2013 2115   NA 138 07/07/2012 1512   K 3.4* 02/11/2013 2115   K 4.3 07/07/2012 1512   CL 97 02/11/2013 2115   CL 104 07/07/2012 1512   CO2 24 02/11/2013 2115   CO2 29 07/07/2012 1512   BUN 12 02/11/2013 2115   BUN 16.3 07/07/2012 1512   CREATININE 0.66 02/11/2013 2115   CREATININE  0.70 02/11/2013 0933   CREATININE 0.9 07/07/2012 1512      Component Value Date/Time   CALCIUM 9.6 02/11/2013 2115   CALCIUM 9.3 07/07/2012 1512   ALKPHOS 98 02/11/2013 2115   ALKPHOS 126 07/07/2012 1512   AST 59* 02/11/2013 2115   AST 29 07/07/2012 1512   ALT 29 02/11/2013 2115   ALT 14 07/07/2012 1512   BILITOT 0.7 02/11/2013 2115   BILITOT 0.83 07/07/2012 1512       RADIOGRAPHIC STUDIES: US Abdomen  Complete  02/11/2013   CLINICAL DATA:  Abdominal pain. History of HIV and gallstones.  EXAM: ULTRASOUND ABDOMEN COMPLETE  COMPARISON:  11/19/2012  FINDINGS: Gallbladder  Multiple gallstones measuring up to 1.1 cm. No wall thickening or pericholecystic fluid. Sonographic Murphy's sign was not elicited.  Common bile duct  Diameter: Mildly dilated common 8 mm. Upper normal 6-7 mm in this age group.  Liver  No focal lesion identified. Within normal limits in parenchymal echogenicity. No intrahepatic ductal dilatation.  IVC  No abnormality visualized.  Pancreas  Visualized portion unremarkable.  Spleen  Size and appearance within normal limits.  Right Kidney  Length: 10.0 cm echogenicity within normal limits. No mass or hydronephrosis visualized.  Left Kidney  Length: 10.4 cm Echogenicity within normal limits. No mass or hydronephrosis visualized.  Abdominal aorta  No aneurysm visualized.  IMPRESSION: Cholelithiasis without acute cholecystitis.  Similar prominence of the common bile duct since 11/19/2012. If the bilirubin is elevated, consider contrast-enhanced CT or MRCP.   Electronically Signed   By: Jeronimo Greaves   On: 02/11/2013 22:23    ASSESSMENT AND PLAN: this is a very pleasant 62 years old African American female with monoclonal gammopathy of undetermined significance currently on observation. Her myeloma panel is still pending today. Her CBC is unremarkable except for mild thrombocytopenia.  I recommended for the patient to continue on observation with repeat myeloma panel in 6 months. If there is any significant abnormalities in her myeloma panel today, I would call the patient with recommendation. She was advised to call immediately if she has any concerning symptoms in the interval.  The patient voices understanding of current disease status and treatment options and is in agreement with the current care plan.  All questions were answered. The patient knows to call the clinic with any problems,  questions or concerns. We can certainly see the patient much sooner if necessary.

## 2013-03-01 NOTE — Patient Instructions (Signed)
Followup visit in 6 months with repeat myeloma panel. 

## 2013-03-02 LAB — IGG, IGA, IGM
IgA: 142 mg/dL (ref 69–380)
IgG (Immunoglobin G), Serum: 3060 mg/dL — ABNORMAL HIGH (ref 690–1700)
IgM, Serum: 121 mg/dL (ref 52–322)

## 2013-03-02 LAB — KAPPA/LAMBDA LIGHT CHAINS
Kappa free light chain: 6.25 mg/dL — ABNORMAL HIGH (ref 0.33–1.94)
Kappa:Lambda Ratio: 1.08 (ref 0.26–1.65)
Lambda Free Lght Chn: 5.79 mg/dL — ABNORMAL HIGH (ref 0.57–2.63)

## 2013-03-02 LAB — BETA 2 MICROGLOBULIN, SERUM: Beta-2 Microglobulin: 2.26 mg/L — ABNORMAL HIGH (ref 1.01–1.73)

## 2013-03-03 ENCOUNTER — Telehealth: Payer: Self-pay | Admitting: Internal Medicine

## 2013-03-03 NOTE — Telephone Encounter (Signed)
s.w pt and advised on April 2015 appt....pt aware and aok

## 2013-03-07 ENCOUNTER — Ambulatory Visit: Payer: Medicare Other | Admitting: Internal Medicine

## 2013-03-28 ENCOUNTER — Telehealth (INDEPENDENT_AMBULATORY_CARE_PROVIDER_SITE_OTHER): Payer: Self-pay

## 2013-03-28 NOTE — Telephone Encounter (Signed)
F/U call ; Informed patient she had an appt 03-30-13@ 900 am with Dr. Ezzard Standing, Patient states she is doing good she is not ready for surgery at this time. Advised her if she had an G.B attack she may need to have surgery asap and it may be by another Physician. Patient verbalized understanding and has decided to cancel appointment .

## 2013-03-30 ENCOUNTER — Encounter (INDEPENDENT_AMBULATORY_CARE_PROVIDER_SITE_OTHER): Payer: Medicare Other | Admitting: Surgery

## 2013-04-18 ENCOUNTER — Telehealth: Payer: Self-pay | Admitting: *Deleted

## 2013-04-18 NOTE — Telephone Encounter (Signed)
Patient called requesting an Rx for Vicodin 7.5-325. Per Dr. Drue Second patient should be referred to pain management or PCP. Patient does not want to go to pain clinic and states she needs time to get a PCP. She is requesting that Dr. Drue Second call her. Vanessa Beard

## 2013-05-11 ENCOUNTER — Telehealth: Payer: Self-pay | Admitting: *Deleted

## 2013-05-11 ENCOUNTER — Ambulatory Visit: Payer: Medicare Other | Attending: Internal Medicine | Admitting: Internal Medicine

## 2013-05-11 VITALS — BP 138/88 | HR 67 | Temp 98.9°F | Resp 14

## 2013-05-11 DIAGNOSIS — M25519 Pain in unspecified shoulder: Secondary | ICD-10-CM

## 2013-05-11 DIAGNOSIS — M25511 Pain in right shoulder: Secondary | ICD-10-CM

## 2013-05-11 DIAGNOSIS — D472 Monoclonal gammopathy: Secondary | ICD-10-CM | POA: Insufficient documentation

## 2013-05-11 MED ORDER — HYDROCODONE-ACETAMINOPHEN 5-325 MG PO TABS: 1.0000 | ORAL_TABLET | Freq: Two times a day (BID) | ORAL | Status: DC | PRN

## 2013-05-11 NOTE — Progress Notes (Signed)
Patient ID: Vanessa Beard, female   DOB: [birth year], 62 y.o.   MRN: [id number]   CC:  HPI:  This 62 year old female with a history of monoclonal gammopathy of undetermined significance, HIV, biliary colic intermittently presents to the clinic primarily to establish care. Her chief complaint is right shoulder pain she states that she had surgery for her rotator cuff done by Dr. Madelon Lips. She is confident that she will be seen in January by Dr. Madelon Lips. She has been taking Vicodin for the pain. 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 She states it has  likely exacerbated her right shoulder pain  She is otherwise in good health. She has seen Dr. Ezzard Standing for gallbladder. She has had no symptoms from it recently.    Allergies  Allergen Reactions  . Dapsone Other (See Comments)    stomach  burning  . Retrovir [Zidovudine] Other (See Comments)    Changed skin color  . Sulfamethoxazole-Trimethoprim Other (See Comments)    Flu like symptoms   Past Medical History  Diagnosis Date  . HIV infection   . Osteoporosis   . Gall bladder disease \   Current Outpatient Prescriptions on File Prior to Visit  Medication Sig Dispense Refill  . amLODipine (NORVASC) 10 MG tablet Take 1 tablet (10 mg total) by mouth daily.  90 tablet  3  . zolpidem (AMBIEN) 10 MG tablet Take 1 tablet (10 mg total) by mouth at bedtime as needed for sleep.  30 tablet  2  . Abacavir-Dolutegravir-Lamivud 600-50-300 MG TABS Take 1 tablet by mouth daily.  30 tablet  11   No current facility-administered medications on file prior to visit.   Family History  Problem Relation Age of Onset  . Diabetes Mother   . Hypertension Mother    History   Social History  . Marital Status: Divorced    Spouse Name: N/A    Number of Children: N/A  . Years of Education: N/A   Occupational History  . Not on file.   Social History Main Topics  . Smoking status: Never Smoker   . Smokeless tobacco: Never Used  . Alcohol  Use: 1.5 oz/week    3 drink(s) per week     Comment: wine or [place] alcohol  . Drug Use: No  . Sexual Activity: No     Comment: declined condoms   Other Topics Concern  . Not on file   Social History Narrative  . No narrative on file    Review of Systems  Constitutional: Negative for fever, chills, diaphoresis, activity change, appetite change and fatigue.  HENT: Negative for ear pain, nosebleeds, congestion, facial swelling, rhinorrhea, neck pain, neck stiffness and ear discharge.   Eyes: Negative for pain, discharge, redness, itching and visual disturbance.  Respiratory: Negative for cough, choking, chest tightness, shortness of breath, wheezing and stridor.   Cardiovascular: Negative for chest pain, palpitations and leg swelling.  Gastrointestinal: Negative for abdominal distention.  Genitourinary: Negative for dysuria, urgency, frequency, hematuria, flank pain, decreased urine volume, difficulty urinating and dyspareunia.  Musculoskeletal: Negative for back pain, joint swelling, arthralgias and gait problem.  Neurological: Negative for dizziness, tremors, seizures, syncope, facial asymmetry, speech difficulty, weakness, light-headedness, numbness and headaches.  Hematological: Negative for adenopathy. Does not bruise/bleed easily.  Psychiatric/Behavioral: Negative for hallucinations, behavioral problems, confusion, dysphoric mood, decreased concentration and agitation.    Objective:   Filed Vitals:   05/11/13 1204  BP: 138/88  Pulse: 67  Temp: 98.9 F (  37.2 C)  Resp: 14    Physical Exam  Constitutional: Appears well-developed and well-nourished. No distress.  HENT: Normocephalic. External right and left ear normal. Oropharynx is clear and moist.  Eyes: Conjunctivae and EOM are normal. PERRLA, no scleral icterus.  Neck: Normal ROM. Neck supple. No JVD. No tracheal deviation. No thyromegaly.  CVS: RRR, S1/S2 +, no murmurs, no gallops, no carotid bruit.  Pulmonary: Effort  and breath sounds normal, no stridor, rhonchi, wheezes, rales.  Abdominal: Soft. BS +,  no distension, tenderness, rebound or guarding.  Musculoskeletal: Normal range of motion. No edema and no tenderness.  Lymphadenopathy: No lymphadenopathy noted, cervical, inguinal. Neuro: Alert. Normal reflexes, muscle tone coordination. No cranial nerve deficit. Skin: Skin is warm and dry. No rash noted. Not diaphoretic. No erythema. No pallor.  Psychiatric: Normal mood and affect. Behavior, judgment, thought content normal.   Lab Results  Component Value Date   WBC 4.7 03/01/2013   HGB 12.6 03/01/2013   HCT 39.1 03/01/2013   MCV 92.7 03/01/2013   PLT 136* 03/01/2013   Lab Results  Component Value Date   CREATININE 0.8 03/01/2013   BUN 11.6 03/01/2013   NA 139 03/01/2013   K 3.7 03/01/2013   CL 97 02/11/2013   CO2 25 03/01/2013    No results found for this basename: HGBA1C   Lipid Panel     Component Value Date/Time   CHOL 121 04/12/2012 0957   TRIG 59 04/12/2012 0957   HDL 48 04/12/2012 0957   CHOLHDL 2.5 04/12/2012 0957   VLDL 12 04/12/2012 0957   LDLCALC 61 04/12/2012 0957       Assessment and plan:   Patient Active Problem List   Diagnosis Date Noted  . Gall bladder disease 02/03/2013  . 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  . Compression fracture of L3 lumbar vertebra 07/01/2011  . Thyroid dysfunction 07/01/2011  . Anemia 07/01/2011  . Sore throat 04/02/2011  . Hyperparathyroidism 02/10/2011  . Osteopenia 02/03/2011  . PAP SMEAR, LGSIL, ABNORMAL 12/21/2009  . COUGH 07/18/2009  . HEPATITIS C, CHRONIC 12/19/2008  . LEUKORRHEA 04/07/2008  . FATIGUE 04/07/2008  . BACK PAIN, LUMBAR 11/10/2007  . ALLERGIC RHINITIS 07/09/2007  . PNEUMOCYSTIS PNEUMONIA 05/07/2007  . HYPERTENSION 05/07/2007  . DENTAL CARIES 05/07/2007  . INSOMNIA 05/07/2007  . PNEUMONIA, HX OF 05/07/2007  . HERPES ZOSTER, HX OF 05/07/2007  . Human Immunodeficiency Virus (HIV)  Disease 08/04/2006        MGUS repeat myeloma panel in 6 months. 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 The patient verbalized understanding. The patient was told to call to get any lab results if not heard anything in the next week.

## 2013-05-11 NOTE — Telephone Encounter (Signed)
Contacted pt to gain information of her visit before coming into the clinic due to closing hours at 3pm. Call completely effectively.

## 2013-05-11 NOTE — Progress Notes (Signed)
Pt is establish care. complains of pain in Rt shoulder on a scale of 8 today; worsens with the weather. Pt is HIV positive.

## 2013-06-09 ENCOUNTER — Ambulatory Visit (INDEPENDENT_AMBULATORY_CARE_PROVIDER_SITE_OTHER): Payer: Medicare Other | Admitting: Internal Medicine

## 2013-06-09 VITALS — BP 130/88 | HR 67 | Temp 98.2°F | Ht 60.0 in | Wt 149.5 lb

## 2013-06-09 DIAGNOSIS — G4700 Insomnia, unspecified: Secondary | ICD-10-CM

## 2013-06-09 MED ORDER — ZOLPIDEM TARTRATE 10 MG PO TABS: 10.0000 mg | ORAL_TABLET | Freq: Every evening | ORAL | Status: DC | PRN

## 2013-06-09 NOTE — Progress Notes (Signed)
   Subjective:    Patient ID: Vanessa Beard, female    DOB: June 04, 1950, 63 y.o.   MRN: [id number]  HPI CD 4 count of 370/VL<20 ( end of sep) now on triomeq daily. Doing well since last visit. Takes triomeq daily without missing doses. Not having any side effects. She is seeing ortho this week as follow up to shoulder surgery, recently had cortisone injection. Also complaining of trigger finger to bilateral middle fingers. Some annoyance but not persistent. She is still has difficulty with insomnia. Does not sleep with tv on, sleeps in dark room, does not drink alcohol or coffee in the evening.  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 Has sibling in [place]   Current Outpatient Prescriptions on File Prior to Visit  Medication Sig Dispense Refill  . Abacavir-Dolutegravir-Lamivud 600-50-300 MG TABS Take 1 tablet by mouth daily.  30 tablet  11  . amLODipine (NORVASC) 10 MG tablet Take 1 tablet (10 mg total) by mouth daily.  90 tablet  3  . HYDROcodone-acetaminophen (NORCO/VICODIN) 5-325 MG per tablet Take 1 tablet by mouth 2 (two) times daily as needed for moderate pain.  15 tablet  0  . zolpidem (AMBIEN) 10 MG tablet Take 1 tablet (10 mg total) by mouth at bedtime as needed for sleep.  30 tablet  2   No current facility-administered medications on file prior to visit.   soch hx: she is getting divorce next week, long overdue. Trying to apply for business license   Review of Systems Besides trigger finger and insomnia, 12 point ROS is negative    Objective:   Physical Exam BP 130/88  Pulse 67  Temp(Src) 98.2 F (36.8 C) (Oral)  Ht 5' (1.524 m)  Wt 149 lb 8 oz (67.813 kg)  BMI 29.20 kg/m2 Physical Exam  Constitutional:  oriented to person, place, and time. appears well-developed and well-nourished. No distress.  HENT:  Mouth/Throat: Oropharynx is clear and moist. No oropharyngeal exudate.  Cardiovascular: Normal rate, regular rhythm and normal heart  sounds. Exam reveals no gallop and no friction rub.  No murmur heard.  Pulmonary/Chest: Effort normal and breath sounds normal. No respiratory distress. no wheezes.  Abdominal: Soft. Bowel sounds are normal.  exhibits no distension. There is no tenderness.  Lymphadenopathy:  no cervical adenopathy.  Neurological: He is alert and oriented to person, place, and time.  Skin: Skin is warm and dry. No rash noted. No erythema.  Psychiatric:  a normal mood and affect.  behavior is normal.       Assessment & Plan:  hiv disease= continue with triomeq, well controlled. Check labs at next visit  Insomnia = will refill 3 month supply of ambien. And re-assess at next visit further need.  Health maintenance = will need pap in may 2015  Trigger finger = defer to ortho to decide if needs referral to hand surgeon  rtc in 27months

## 2013-07-01 ENCOUNTER — Ambulatory Visit: Payer: Medicare Other

## 2013-07-22 ENCOUNTER — Ambulatory Visit (INDEPENDENT_AMBULATORY_CARE_PROVIDER_SITE_OTHER): Payer: Medicare Other | Admitting: *Deleted

## 2013-07-22 ENCOUNTER — Other Ambulatory Visit (HOSPITAL_COMMUNITY)
Admission: RE | Admit: 2013-07-22 | Discharge: 2013-07-22 | Disposition: A | Discharge status: Home / Self Care | Payer: Medicare Other | Source: Ambulatory Visit | Attending: Internal Medicine | Admitting: Internal Medicine

## 2013-07-22 DIAGNOSIS — Z113 Encounter for screening for infections with a predominantly sexual mode of transmission: Secondary | ICD-10-CM | POA: Insufficient documentation

## 2013-07-22 DIAGNOSIS — Z1231 Encounter for screening mammogram for malignant neoplasm of breast: Secondary | ICD-10-CM

## 2013-07-22 DIAGNOSIS — Z124 Encounter for screening for malignant neoplasm of cervix: Secondary | ICD-10-CM | POA: Insufficient documentation

## 2013-07-22 DIAGNOSIS — R87810 Cervical high risk human papillomavirus (HPV) DNA test positive: Secondary | ICD-10-CM | POA: Insufficient documentation

## 2013-07-22 DIAGNOSIS — R87619 Unspecified abnormal cytological findings in specimens from cervix uteri: Secondary | ICD-10-CM | POA: Insufficient documentation

## 2013-07-22 DIAGNOSIS — Z1151 Encounter for screening for human papillomavirus (HPV): Secondary | ICD-10-CM | POA: Insufficient documentation

## 2013-07-22 NOTE — Patient Instructions (Signed)
Your results will be ready in about a week.  I will mail them to you.  Thank you for coming to the Center for your care.  Denise,  RN 

## 2013-07-22 NOTE — Progress Notes (Signed)
  Subjective:     Vanessa Beard is a 63 y.o. woman who comes in today for a  pap smear only. . Previous abnormal Pap smears: no. Contraception: condoms  Objective:    There were no vitals taken for this visit. Pelvic Exam:  Pap smear obtained.   Assessment:    Screening pap smear.   Plan:    Follow up in one year, or as indicated by Pap results.  Pt given educational materials re: HIV and women, self-esteem, BSE, nutrition and diet management, PAP smears and partner safety. Pt declined condoms.

## 2013-07-29 ENCOUNTER — Telehealth: Payer: Self-pay | Admitting: *Deleted

## 2013-07-29 DIAGNOSIS — R87610 Atypical squamous cells of undetermined significance on cytologic smear of cervix (ASC-US): Secondary | ICD-10-CM

## 2013-07-29 NOTE — Telephone Encounter (Signed)
Pt being referred to [facility] for f/u of ABL PAP smear.  Pt verbalized understanding.

## 2013-08-09 ENCOUNTER — Encounter: Payer: Self-pay | Admitting: Internal Medicine

## 2013-08-09 ENCOUNTER — Ambulatory Visit: Payer: Medicare Other | Attending: Internal Medicine | Admitting: Internal Medicine

## 2013-08-09 VITALS — BP 130/90 | HR 71 | Temp 98.3°F | Resp 16

## 2013-08-09 DIAGNOSIS — I10 Essential (primary) hypertension: Secondary | ICD-10-CM | POA: Insufficient documentation

## 2013-08-09 DIAGNOSIS — M810 Age-related osteoporosis without current pathological fracture: Secondary | ICD-10-CM | POA: Insufficient documentation

## 2013-08-09 DIAGNOSIS — Z09 Encounter for follow-up examination after completed treatment for conditions other than malignant neoplasm: Secondary | ICD-10-CM

## 2013-08-09 NOTE — Progress Notes (Signed)
MRN: [id number] Name: Vanessa Beard  Sex: female Age: 63 y.o. DOB: [birth year]  Allergies: Dapsone; Retrovir; and Sulfamethoxazole-trimethoprim  Chief Complaint  Patient presents with  . Follow-up    HPI: Patient is 63 y.o. female who history of hypertension comes today for followup, today her blood pressure is elevated, as per patient she has not taken the medication today, she also history of HIV following up with her infectious disease Dr. Patient denies any acute symptoms, she is up-to-date with mammogram.  Past Medical History  Diagnosis Date  . HIV infection   . Osteoporosis   . Gall bladder disease \    Past Surgical History  Procedure Laterality Date  . Ectopic pregnancy surgery    . Shoulder surgery Right       Medication List       This list is accurate as of: 08/09/13  2:24 PM.  Always use your most recent med list.               Abacavir-Dolutegravir-Lamivud 600-50-300 MG Tabs  Take 1 tablet by mouth daily.     amLODipine 10 MG tablet  Commonly known as:  NORVASC  Take 1 tablet (10 mg total) by mouth daily.     HYDROcodone-acetaminophen 5-325 MG per tablet  Commonly known as:  NORCO/VICODIN  Take 1 tablet by mouth 2 (two) times daily as needed for moderate pain.     zolpidem 10 MG tablet  Commonly known as:  AMBIEN  Take 1 tablet (10 mg total) by mouth at bedtime as needed for sleep.        No orders of the defined types were placed in this encounter.    Immunization History  Administered Date(s) Administered  . Hepatitis B 11/10/2007, 03/22/2008, 09/06/2008  . Influenza Whole 02/03/2011  . Influenza,inj,Quad PF,36+ Mos 03/01/2013  . Pneumococcal Polysaccharide-23 07/09/2007, 03/01/2013    Family History  Problem Relation Age of Onset  . Diabetes Mother   . Hypertension Mother     History  Substance Use Topics  . Smoking status: Never Smoker   . Smokeless tobacco: Never Used  . Alcohol Use: 1.5 oz/week    3 drink(s) per  week     Comment: wine or [place] alcohol    Review of Systems   As noted in HPI  Filed Vitals:   08/09/13 1423  BP: 130/90  Pulse:   Temp:   Resp:     Physical Exam  Physical Exam  Constitutional: No distress.  Eyes: EOM are normal. Pupils are equal, round, and reactive to light.  Cardiovascular: Normal rate and regular rhythm.   Pulmonary/Chest: Breath sounds normal. No respiratory distress. She has no wheezes. She has no rales.  Musculoskeletal: She exhibits no edema.    CBC    Component Value Date/Time   WBC 4.7 03/01/2013 1524   WBC 6.5 02/11/2013 2115   RBC 4.22 03/01/2013 1524   RBC 4.36 02/11/2013 2115   HGB 12.6 03/01/2013 1524   HGB 13.0 02/11/2013 2115   HCT 39.1 03/01/2013 1524   HCT 40.0 02/11/2013 2115   PLT 136* 03/01/2013 1524   PLT 168 02/11/2013 2115   MCV 92.7 03/01/2013 1524   MCV 91.7 02/11/2013 2115   LYMPHSABS 1.3 03/01/2013 1524   LYMPHSABS 1.9 02/11/2013 2115   MONOABS 0.2 03/01/2013 1524   MONOABS 0.4 02/11/2013 2115   EOSABS 0.0 03/01/2013 1524   EOSABS 0.0 02/11/2013 2115   BASOSABS 0.0 03/01/2013 1524   BASOSABS  0.0 02/11/2013 2115    CMP     Component Value Date/Time   NA 139 03/01/2013 1524   NA 134* 02/11/2013 2115   K 3.7 03/01/2013 1524   K 3.4* 02/11/2013 2115   CL 97 02/11/2013 2115   CL 104 07/07/2012 1512   CO2 25 03/01/2013 1524   CO2 24 02/11/2013 2115   GLUCOSE 100 03/01/2013 1524   GLUCOSE 108* 02/11/2013 2115   GLUCOSE 88 07/07/2012 1512   BUN 11.6 03/01/2013 1524   BUN 12 02/11/2013 2115   CREATININE 0.8 03/01/2013 1524   CREATININE 0.66 02/11/2013 2115   CREATININE 0.70 02/11/2013 0933   CALCIUM 9.3 03/01/2013 1524   CALCIUM 9.6 02/11/2013 2115   PROT 9.3* 03/01/2013 1524   PROT 9.5* 02/11/2013 2115   ALBUMIN 3.4* 03/01/2013 1524   ALBUMIN 3.8 02/11/2013 2115   AST 36* 03/01/2013 1524   AST 59* 02/11/2013 2115   ALT 21 03/01/2013 1524   ALT 29 02/11/2013 2115   ALKPHOS 85 03/01/2013 1524   ALKPHOS 98 02/11/2013 2115    BILITOT 0.64 03/01/2013 1524   BILITOT 0.7 02/11/2013 2115   GFRNONAA >90 02/11/2013 2115   GFRAA >90 02/11/2013 2115    Lab Results  Component Value Date/Time   CHOL 121 04/12/2012  9:57 AM    No components found with this basename: hga1c    Lab Results  Component Value Date/Time   AST 36* 03/01/2013  3:24 PM   AST 59* 02/11/2013  9:15 PM    Assessment and Plan  Follow up  Essential hypertension, benign  repeat manual blood pressure is 130/90, advised patient to take medication regularly also advise for DASH diet.  Return in about 6 months (around 02/09/2014) for hypertension.  Lorayne Marek, MD

## 2013-08-09 NOTE — Progress Notes (Signed)
Patient here for follow up of right shoulder pain

## 2013-08-09 NOTE — Patient Instructions (Signed)
DASH Diet  The DASH diet stands for "Dietary Approaches to Stop Hypertension." It is a healthy eating plan that has been shown to reduce high blood pressure (hypertension) in as little as 14 days, while also possibly providing other significant health benefits. These other health benefits include reducing the risk of breast cancer after menopause and reducing the risk of type 2 diabetes, heart disease, colon cancer, and stroke. Health benefits also include weight loss and slowing kidney failure in patients with chronic kidney disease.   DIET GUIDELINES  · Limit salt (sodium). Your diet should contain less than 1500 mg of sodium daily.  · Limit refined or processed carbohydrates. Your diet should include mostly whole grains. Desserts and added sugars should be used sparingly.  · Include small amounts of heart-healthy fats. These types of fats include nuts, oils, and tub margarine. Limit saturated and trans fats. These fats have been shown to be harmful in the body.  CHOOSING FOODS   The following food groups are based on a 2000 calorie diet. See your Registered Dietitian for individual calorie needs.  Grains and Grain Products (6 to 8 servings daily)  · Eat More Often: Whole-wheat bread, brown rice, whole-grain or wheat pasta, quinoa, popcorn without added fat or salt (air popped).  · Eat Less Often: Docken bread, Tarnowski pasta, Gropp rice, cornbread.  Vegetables (4 to 5 servings daily)  · Eat More Often: Fresh, frozen, and canned vegetables. Vegetables may be raw, steamed, roasted, or grilled with a minimal amount of fat.  · Eat Less Often/Avoid: Creamed or fried vegetables. Vegetables in a cheese sauce.  Fruit (4 to 5 servings daily)  · Eat More Often: All fresh, canned (in natural juice), or frozen fruits. Dried fruits without added sugar. One hundred percent fruit juice (½ cup [237 mL] daily).  · Eat Less Often: Dried fruits with added sugar. Canned fruit in light or heavy syrup.  Lean Meats, Fish, and Poultry (2  servings or less daily. One serving is 3 to 4 oz [85-114 g]).  · Eat More Often: Ninety percent or leaner ground beef, tenderloin, sirloin. Round cuts of beef, chicken breast, turkey breast. All fish. Grill, bake, or broil your meat. Nothing should be fried.  · Eat Less Often/Avoid: Fatty cuts of meat, turkey, or chicken leg, thigh, or wing. Fried cuts of meat or fish.  Dairy (2 to 3 servings)  · Eat More Often: Low-fat or fat-free milk, low-fat plain or light yogurt, reduced-fat or part-skim cheese.  · Eat Less Often/Avoid: Milk (whole, 2%). Whole milk yogurt. Full-fat cheeses.  Nuts, Seeds, and Legumes (4 to 5 servings per week)  · Eat More Often: All without added salt.  · Eat Less Often/Avoid: Salted nuts and seeds, canned beans with added salt.  Fats and Sweets (limited)  · Eat More Often: Vegetable oils, tub margarines without trans fats, sugar-free gelatin. Mayonnaise and salad dressings.  · Eat Less Often/Avoid: Coconut oils, palm oils, butter, stick margarine, cream, half and half, cookies, candy, pie.  FOR MORE INFORMATION  The Dash Diet Eating Plan: www.dashdiet.org  Document Released: 04/24/2011 Document Revised: 07/28/2011 Document Reviewed: 04/24/2011  ExitCare® Patient Information ©2014 [employer].

## 2013-08-31 ENCOUNTER — Ambulatory Visit (HOSPITAL_BASED_OUTPATIENT_CLINIC_OR_DEPARTMENT_OTHER): Payer: Medicare Other | Admitting: Internal Medicine

## 2013-08-31 ENCOUNTER — Encounter: Payer: Self-pay | Admitting: Internal Medicine

## 2013-08-31 ENCOUNTER — Other Ambulatory Visit (HOSPITAL_BASED_OUTPATIENT_CLINIC_OR_DEPARTMENT_OTHER): Payer: Medicare Other

## 2013-08-31 VITALS — BP 161/96 | HR 95 | Temp 98.2°F | Resp 18 | Ht 60.0 in | Wt 153.1 lb

## 2013-08-31 DIAGNOSIS — D472 Monoclonal gammopathy: Secondary | ICD-10-CM

## 2013-08-31 DIAGNOSIS — B20 Human immunodeficiency virus [HIV] disease: Secondary | ICD-10-CM

## 2013-08-31 DIAGNOSIS — M810 Age-related osteoporosis without current pathological fracture: Secondary | ICD-10-CM

## 2013-08-31 LAB — CBC WITH DIFFERENTIAL/PLATELET
BASO%: 0.7 % (ref 0.0–2.0)
Basophils Absolute: 0 10*3/uL (ref 0.0–0.1)
EOS%: 0.9 % (ref 0.0–7.0)
Eosinophils Absolute: 0 10*3/uL (ref 0.0–0.5)
HCT: 39.3 % (ref 34.8–46.6)
HGB: 12.7 g/dL (ref 11.6–15.9)
LYMPH%: 27.4 % (ref 14.0–49.7)
MCH: 30.5 pg (ref 25.1–34.0)
MCHC: 32.5 g/dL (ref 31.5–36.0)
MCV: 94 fL (ref 79.5–101.0)
MONO#: 0.3 10*3/uL (ref 0.1–0.9)
MONO%: 7.6 % (ref 0.0–14.0)
NEUT#: 2.8 10*3/uL (ref 1.5–6.5)
NEUT%: 63.4 % (ref 38.4–76.8)
Platelets: 156 10*3/uL (ref 145–400)
RBC: 4.18 10*6/uL (ref 3.70–5.45)
RDW: 12.7 % (ref 11.2–14.5)
WBC: 4.5 10*3/uL (ref 3.9–10.3)
lymph#: 1.2 10*3/uL (ref 0.9–3.3)

## 2013-08-31 LAB — COMPREHENSIVE METABOLIC PANEL (CC13)
ALT: 9 U/L (ref 0–55)
AST: 25 U/L (ref 5–34)
Albumin: 3.7 g/dL (ref 3.5–5.0)
Alkaline Phosphatase: 99 U/L (ref 40–150)
Anion Gap: 7 mEq/L (ref 3–11)
BUN: 14.7 mg/dL (ref 7.0–26.0)
CO2: 27 mEq/L (ref 22–29)
Calcium: 9.6 mg/dL (ref 8.4–10.4)
Chloride: 108 mEq/L (ref 98–109)
Creatinine: 0.8 mg/dL (ref 0.6–1.1)
Glucose: 69 mg/dl — ABNORMAL LOW (ref 70–140)
Potassium: 3.4 mEq/L — ABNORMAL LOW (ref 3.5–5.1)
Sodium: 142 mEq/L (ref 136–145)
Total Bilirubin: 0.67 mg/dL (ref 0.20–1.20)
Total Protein: 8.8 g/dL — ABNORMAL HIGH (ref 6.4–8.3)

## 2013-08-31 LAB — LACTATE DEHYDROGENASE (CC13): LDH: 177 U/L (ref 125–245)

## 2013-08-31 NOTE — Progress Notes (Signed)
[facility] [phone]   [phone]  OFFICE PROGRESS NOTE  Carlyle Basques, MD 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  2) HIV   PRIOR THERAPY: None.   CURRENT THERAPY: Observation.  INTERVAL HISTORY: Vanessa Beard 63 y.o. female returns to the clinic today for routine six-month followup visit. The patient is feeling fine today with no specific complaints. She denied having any significant fatigue or weakness. She denied having any aching pain. The patient denied having any significant weight loss or night sweats. She has no chest pain, shortness of breath, cough or hemoptysis. The patient has repeat CBC, comprehensive metabolic panel, LDH and myeloma panel performed earlier today and she is here for evaluation and discussion of her lab results.  MEDICAL HISTORY: Past Medical History  Diagnosis Date  . HIV infection   . Osteoporosis   . Gall bladder disease \    ALLERGIES:  is allergic to dapsone; retrovir; and sulfamethoxazole-trimethoprim.  MEDICATIONS:  Current Outpatient Prescriptions  Medication Sig Dispense Refill  . Abacavir-Dolutegravir-Lamivud 600-50-300 MG TABS Take 1 tablet by mouth daily.  30 tablet  11  . amLODipine (NORVASC) 10 MG tablet Take 1 tablet (10 mg total) by mouth daily.  90 tablet  3  . HYDROcodone-acetaminophen (NORCO/VICODIN) 5-325 MG per tablet Take 1 tablet by mouth 2 (two) times daily as needed for moderate pain.  15 tablet  0  . zolpidem (AMBIEN) 10 MG tablet Take 1 tablet (10 mg total) by mouth at bedtime as needed for sleep.  30 tablet  2   No current facility-administered medications for this visit.    SURGICAL HISTORY:  Past Surgical History  Procedure Laterality Date  . Ectopic pregnancy surgery    . Shoulder surgery Right     REVIEW OF SYSTEMS:  A comprehensive review of systems  was negative.   PHYSICAL EXAMINATION: General appearance: alert, cooperative and no distress Head: Normocephalic, without obvious abnormality, atraumatic Neck: no adenopathy, no JVD, supple, symmetrical, trachea midline and thyroid not enlarged, symmetric, no tenderness/mass/nodules Lymph nodes: Cervical, supraclavicular, and axillary nodes normal. Resp: clear to auscultation bilaterally Back: symmetric, no curvature. ROM normal. No CVA tenderness. Cardio: regular rate and rhythm, S1, S2 normal, no murmur, click, rub or gallop GI: soft, non-tender; bowel sounds normal; no masses,  no organomegaly Extremities: extremities normal, atraumatic, no cyanosis or edema  ECOG PERFORMANCE STATUS: 0 - Asymptomatic  Blood pressure 161/96, pulse 95, temperature 98.2 F (36.8 C), temperature source Oral, resp. rate 18, height 5' (1.524 m), weight 153 lb 1.6 oz (69.446 kg), SpO2 98.00%.  LABORATORY DATA: Lab Results  Component Value Date   WBC 4.5 08/31/2013   HGB 12.7 08/31/2013   HCT 39.3 08/31/2013   MCV 94.0 08/31/2013   PLT 156 08/31/2013      Chemistry      Component Value Date/Time   NA 139 03/01/2013 1524   NA 134* 02/11/2013 2115   K 3.7 03/01/2013 1524   K 3.4* 02/11/2013 2115   CL 97 02/11/2013 2115   CL 104 07/07/2012 1512   CO2 25 03/01/2013 1524   CO2 24 02/11/2013 2115   BUN 11.6 03/01/2013 1524   BUN 12 02/11/2013 2115   CREATININE 0.8 03/01/2013 1524   CREATININE 0.66 02/11/2013 2115   CREATININE 0.70 02/11/2013 0933      Component Value Date/Time   CALCIUM 9.3  03/01/2013 1524   CALCIUM 9.6 02/11/2013 2115   ALKPHOS 85 03/01/2013 1524   ALKPHOS 98 02/11/2013 2115   AST 36* 03/01/2013 1524   AST 59* 02/11/2013 2115   ALT 21 03/01/2013 1524   ALT 29 02/11/2013 2115   BILITOT 0.64 03/01/2013 1524   BILITOT 0.7 02/11/2013 2115       RADIOGRAPHIC STUDIES:  ASSESSMENT AND PLAN: this is a very pleasant 63 years old [place] American female with monoclonal gammopathy of  undetermined significance currently on observation. Her myeloma panel is still pending today. Her CBC is unremarkable except for mild thrombocytopenia. Her previous myeloma panel 6 months ago was unremarkable. I recommended for the patient to continue on observation with repeat myeloma panel in 6 months. If there is any significant abnormalities in her myeloma panel today, I would call the patient with recommendation. She was advised to call immediately if she has any concerning symptoms in the interval.  The patient voices understanding of current disease status and treatment options and is in agreement with the current care plan.  All questions were answered. The patient knows to call the clinic with any problems, questions or concerns. We can certainly see the patient much sooner if necessary.  Disclaimer: This note was dictated with voice recognition software. Similar sounding words can inadvertently be transcribed and may not be corrected upon review.

## 2013-09-02 ENCOUNTER — Telehealth: Payer: Self-pay | Admitting: Internal Medicine

## 2013-09-02 LAB — IGG, IGA, IGM
IgA: 173 mg/dL (ref 69–380)
IgG (Immunoglobin G), Serum: 2110 mg/dL — ABNORMAL HIGH (ref 690–1700)
IgM, Serum: 140 mg/dL (ref 52–322)

## 2013-09-02 LAB — BETA 2 MICROGLOBULIN, SERUM: Beta-2 Microglobulin: 2.46 mg/L (ref <=2.51)

## 2013-09-02 LAB — KAPPA/LAMBDA LIGHT CHAINS
Kappa free light chain: 4.45 mg/dL — ABNORMAL HIGH (ref 0.33–1.94)
Kappa:Lambda Ratio: 1.25 (ref 0.26–1.65)
Lambda Free Lght Chn: 3.56 mg/dL — ABNORMAL HIGH (ref 0.57–2.63)

## 2013-09-02 NOTE — Telephone Encounter (Signed)
s.w. pt and advised on OCT appt....pt ok adn aware °

## 2013-09-07 ENCOUNTER — Encounter: Payer: Self-pay | Admitting: Family Medicine

## 2013-09-07 ENCOUNTER — Emergency Department (HOSPITAL_COMMUNITY): Payer: Medicare Other

## 2013-09-07 ENCOUNTER — Encounter (HOSPITAL_COMMUNITY): Payer: Self-pay | Admitting: Emergency Medicine

## 2013-09-07 ENCOUNTER — Emergency Department (HOSPITAL_COMMUNITY)
Admission: EM | Admit: 2013-09-07 | Discharge: 2013-09-07 | Disposition: A | Discharge status: Home / Self Care | Payer: Medicare Other | Attending: Emergency Medicine | Admitting: Emergency Medicine

## 2013-09-07 DIAGNOSIS — Z79899 Other long term (current) drug therapy: Secondary | ICD-10-CM | POA: Insufficient documentation

## 2013-09-07 DIAGNOSIS — R112 Nausea with vomiting, unspecified: Secondary | ICD-10-CM

## 2013-09-07 DIAGNOSIS — R1011 Right upper quadrant pain: Secondary | ICD-10-CM

## 2013-09-07 DIAGNOSIS — Z21 Asymptomatic human immunodeficiency virus [HIV] infection status: Secondary | ICD-10-CM | POA: Insufficient documentation

## 2013-09-07 DIAGNOSIS — K8020 Calculus of gallbladder without cholecystitis without obstruction: Secondary | ICD-10-CM | POA: Insufficient documentation

## 2013-09-07 DIAGNOSIS — Z8739 Personal history of other diseases of the musculoskeletal system and connective tissue: Secondary | ICD-10-CM | POA: Insufficient documentation

## 2013-09-07 LAB — CBC WITH DIFFERENTIAL/PLATELET
Basophils Absolute: 0 10*3/uL (ref 0.0–0.1)
Basophils Relative: 0 % (ref 0–1)
Eosinophils Absolute: 0 10*3/uL (ref 0.0–0.7)
Eosinophils Relative: 0 % (ref 0–5)
HCT: 37.9 % (ref 36.0–46.0)
Hemoglobin: 12.7 g/dL (ref 12.0–15.0)
Lymphocytes Relative: 17 % (ref 12–46)
Lymphs Abs: 1.2 10*3/uL (ref 0.7–4.0)
MCH: 30.1 pg (ref 26.0–34.0)
MCHC: 33.5 g/dL (ref 30.0–36.0)
MCV: 89.8 fL (ref 78.0–100.0)
Monocytes Absolute: 0.3 10*3/uL (ref 0.1–1.0)
Monocytes Relative: 5 % (ref 3–12)
Neutro Abs: 5.8 10*3/uL (ref 1.7–7.7)
Neutrophils Relative %: 78 % — ABNORMAL HIGH (ref 43–77)
Platelets: 168 10*3/uL (ref 150–400)
RBC: 4.22 MIL/uL (ref 3.87–5.11)
RDW: 12.7 % (ref 11.5–15.5)
WBC: 7.4 10*3/uL (ref 4.0–10.5)

## 2013-09-07 LAB — COMPREHENSIVE METABOLIC PANEL
ALT: 16 U/L (ref 0–35)
AST: 43 U/L — ABNORMAL HIGH (ref 0–37)
Albumin: 4 g/dL (ref 3.5–5.2)
Alkaline Phosphatase: 111 U/L (ref 39–117)
BUN: 15 mg/dL (ref 6–23)
CO2: 26 mEq/L (ref 19–32)
Calcium: 9.6 mg/dL (ref 8.4–10.5)
Chloride: 99 mEq/L (ref 96–112)
Creatinine, Ser: 0.69 mg/dL (ref 0.50–1.10)
GFR calc Af Amer: >90 mL/min (ref >90)
GFR calc non Af Amer: >90 mL/min (ref >90)
Glucose, Bld: 99 mg/dL (ref 70–99)
Potassium: 3.5 mEq/L — ABNORMAL LOW (ref 3.7–5.3)
Sodium: 139 mEq/L (ref 137–147)
Total Bilirubin: 1.2 mg/dL (ref 0.3–1.2)
Total Protein: 9 g/dL — ABNORMAL HIGH (ref 6.0–8.3)

## 2013-09-07 LAB — LIPASE, BLOOD: Lipase: 39 U/L (ref 11–59)

## 2013-09-07 MED ORDER — ONDANSETRON HCL 4 MG/2ML IJ SOLN
4.0000 mg | Freq: Once | INTRAMUSCULAR | Status: AC
  Administered 2013-09-07: 4 mg via INTRAVENOUS
  Filled 2013-09-07: qty 2

## 2013-09-07 MED ORDER — SODIUM CHLORIDE 0.9 % IV BOLUS (SEPSIS)
1000.0000 mL | Freq: Once | INTRAVENOUS | Status: AC
  Administered 2013-09-07: 1000 mL via INTRAVENOUS

## 2013-09-07 MED ORDER — MORPHINE SULFATE 4 MG/ML IJ SOLN
4.0000 mg | Freq: Once | INTRAMUSCULAR | Status: AC
  Administered 2013-09-07: 4 mg via INTRAVENOUS
  Filled 2013-09-07: qty 1

## 2013-09-07 MED ORDER — HYDROMORPHONE HCL PF 1 MG/ML IJ SOLN
1.0000 mg | Freq: Once | INTRAMUSCULAR | Status: AC
  Administered 2013-09-07: 1 mg via INTRAVENOUS
  Filled 2013-09-07: qty 1

## 2013-09-07 MED ORDER — ONDANSETRON HCL 8 MG PO TABS: 8.0000 mg | ORAL_TABLET | ORAL | Status: DC | PRN

## 2013-09-07 MED ORDER — OXYCODONE-ACETAMINOPHEN 5-325 MG PO TABS: 2.0000 | ORAL_TABLET | ORAL | Status: DC | PRN

## 2013-09-07 NOTE — ED Notes (Signed)
Pt able to tolerate PO fluids and crackers without emesis.

## 2013-09-07 NOTE — Discharge Instructions (Signed)
1. Medications: percocet, zofran, usual home medications 2. Treatment: rest, drink plenty of fluids,  3. Follow Up: Please followup with your primary doctor for discussion of your diagnoses and further evaluation after today's visit; Please also see [facility] for further evaluation of your gallbladder    Biliary Colic  Biliary colic is a steady or irregular pain in the upper abdomen. It is usually under the right side of the rib cage. It happens when gallstones interfere with the normal flow of bile from the gallbladder. Bile is a liquid that helps to digest fats. Bile is made in the liver and stored in the gallbladder. When you eat a meal, bile passes from the gallbladder through the cystic duct and the common bile duct into the small intestine. There, it mixes with partially digested food. If a gallstone blocks either of these ducts, the normal flow of bile is blocked. The muscle cells in the bile duct contract forcefully to try to move the stone. This causes the pain of biliary colic.  SYMPTOMS   A person with biliary colic usually complains of pain in the upper abdomen. This pain can be:  In the center of the upper abdomen just below the breastbone.  In the upper-right part of the abdomen, near the gallbladder and liver.  Spread back toward the right shoulder blade.  Nausea and vomiting.  The pain usually occurs after eating.  Biliary colic is usually triggered by the digestive system's demand for bile. The demand for bile is high after fatty meals. Symptoms can also occur when a person who has been fasting suddenly eats a very large meal. Most episodes of biliary colic pass after 1 to 5 hours. After the most intense pain passes, your abdomen may continue to ache mildly for about 24 hours. DIAGNOSIS  After you describe your symptoms, your caregiver will perform a physical exam. He or she will pay attention to the upper right portion of your belly (abdomen). This is the area  of your liver and gallbladder. An ultrasound will help your caregiver look for gallstones. Specialized scans of the gallbladder may also be done. Blood tests may be done, especially if you have fever or if your pain persists. PREVENTION  Biliary colic can be prevented by controlling the risk factors for gallstones. Some of these risk factors, such as heredity, increasing age, and pregnancy are a normal part of life. Obesity and a high-fat diet are risk factors you can change through a healthy lifestyle. Women going through menopause who take hormone replacement therapy (estrogen) are also more likely to develop biliary colic. TREATMENT   Pain medication may be prescribed.  You may be encouraged to eat a fat-free diet.  If the first episode of biliary colic is severe, or episodes of colic keep retuning, surgery to remove the gallbladder (cholecystectomy) is usually recommended. This procedure can be done through small incisions using an instrument called a laparoscope. The procedure often requires a brief stay in the hospital. Some people can leave the hospital the same day. It is the most widely used treatment in people troubled by painful gallstones. It is effective and safe, with no complications in more than 90% of cases.  If surgery cannot be done, medication that dissolves gallstones may be used. This medication is expensive and can take months or years to work. Only small stones will dissolve.  Rarely, medication to dissolve gallstones is combined with a procedure called shock-wave lithotripsy. This procedure uses carefully aimed shock waves  to break up gallstones. In many people treated with this procedure, gallstones form again within a few years. PROGNOSIS  If gallstones block your cystic duct or common bile duct, you are at risk for repeated episodes of biliary colic. There is also a 25% chance that you will develop a gallbladder infection(acute cholecystitis), or some other complication of  gallstones within 10 to 20 years. If you have surgery, schedule it at a time that is convenient for you and at a time when you are not sick. HOME CARE INSTRUCTIONS   Drink plenty of clear fluids.  Avoid fatty, greasy or fried foods, or any foods that make your pain worse.  Take medications as directed. SEEK MEDICAL CARE IF:   You develop a fever over 100.5 F (38.1 C).  Your pain gets worse over time.  You develop nausea that prevents you from eating and drinking.  You develop vomiting. SEEK IMMEDIATE MEDICAL CARE IF:   You have continuous or severe belly (abdominal) pain which is not relieved with medications.  You develop nausea and vomiting which is not relieved with medications.  You have symptoms of biliary colic and you suddenly develop a fever and shaking chills. This may signal cholecystitis. Call your caregiver immediately.  You develop a yellow color to your skin or the [place] part of your eyes (jaundice). Document Released: 10/06/2005 Document Revised: 07/28/2011 Document Reviewed: 12/16/2007 [facility] Patient Information 2014 [place].

## 2013-09-07 NOTE — ED Provider Notes (Signed)
CSN: [id number]     Arrival date & time 09/07/13  1631 History   First MD Initiated Contact with Patient 09/07/13 1654     Chief Complaint  Patient presents with  . Abdominal Pain  . Emesis     (Consider location/radiation/quality/duration/timing/severity/associated sxs/prior Treatment) The history is provided by the patient and medical records. No language interpreter was used.     Vanessa Beard is a 63 y.o. female  with a hx of HIV (well controlled), "gallbladder disease" presents to the Emergency Department complaining of gradual, persistent, progressively worsening RUQ abd pain onset 12:30 after eating 2 bowls of cereal with whole milk.  Since onset pain has been severe, epigastric and nonradiating.   Pt has a hx of gallstones without cholecystectomy. Pt reports taking hydrocodone without relief.  Pt reports she has not taken her BP medication today.  Pt reports vomiting made it better and nothing makes it worse.  Pt reports emesis was stomach contents and nbnb.  No melena or hematochezia.  Pt denies fever, chills, headache, neck pain, chest pain, SOB, diarrhea, weakness, dizziness, syncope, dysuria, hematuria.         Past Medical History  Diagnosis Date  . HIV infection   . Osteoporosis   . Gall bladder disease \   Past Surgical History  Procedure Laterality Date  . Ectopic pregnancy surgery    . Shoulder surgery Right    Family History  Problem Relation Age of Onset  . Diabetes Mother   . Hypertension Mother    History  Substance Use Topics  . Smoking status: Never Smoker   . Smokeless tobacco: Never Used  . Alcohol Use: 1.5 oz/week    3 drink(s) per week     Comment: wine or [place] alcohol   OB History   Grav Para Term Preterm Abortions TAB SAB Ect Mult Living   5 3 3  0 2 1 0 1 0 2     Review of Systems  Constitutional: Negative for fever, diaphoresis, appetite change, fatigue and unexpected weight change.  HENT: Negative for mouth sores.   Eyes: Negative  for visual disturbance.  Respiratory: Negative for cough, chest tightness, shortness of breath and wheezing.   Cardiovascular: Negative for chest pain.  Gastrointestinal: Positive for nausea, vomiting and abdominal pain (RUQ ). Negative for diarrhea and constipation.  Endocrine: Negative for polydipsia, polyphagia and polyuria.  Genitourinary: Negative for dysuria, urgency, frequency and hematuria.  Musculoskeletal: Negative for back pain and neck stiffness.  Skin: Negative for rash.  Allergic/Immunologic: Negative for immunocompromised state.  Neurological: Negative for syncope, light-headedness and headaches.  Hematological: Does not bruise/bleed easily.  Psychiatric/Behavioral: Negative for sleep disturbance. The patient is not nervous/anxious.       Allergies  Dapsone; Retrovir; and Sulfamethoxazole-trimethoprim  Home Medications   Prior to Admission medications   Medication Sig Start Date End Date Taking? Authorizing Provider  Abacavir-Dolutegravir-Lamivud 629-52-841 MG TABS Take 1 tablet by mouth daily. 03/01/13  Yes Carlyle Basques, MD  amLODipine (NORVASC) 10 MG tablet Take 1 tablet (10 mg total) by mouth daily. 10/21/12  Yes Carlyle Basques, MD  HYDROcodone-acetaminophen (NORCO/VICODIN) 5-325 MG per tablet Take 1 tablet by mouth 2 (two) times daily as needed for moderate pain. 05/11/13  Yes Reyne Dumas, MD  zolpidem (AMBIEN) 10 MG tablet Take 1 tablet (10 mg total) by mouth at bedtime as needed for sleep. 06/09/13  Yes Carlyle Basques, MD   BP 129/75  Pulse 70  Temp(Src) 98.4 F (36.9 C) (Rectal)  Resp 21  SpO2 98% Physical Exam  Nursing note and vitals reviewed. Constitutional: She appears well-developed and well-nourished. No distress.  Awake, alert, nontoxic appearance  HENT:  Head: Normocephalic and atraumatic.  Mouth/Throat: Oropharynx is clear and moist. No oropharyngeal exudate.  Eyes: Conjunctivae are normal. No scleral icterus.  Neck: Normal range of motion.  Neck supple.  Cardiovascular: Normal rate, regular rhythm, normal heart sounds and intact distal pulses.   No murmur heard. Pulmonary/Chest: Effort normal and breath sounds normal. No respiratory distress. She has no wheezes.  Clear and equal breath sounds  Abdominal: Soft. Normal appearance and bowel sounds are normal. She exhibits no distension and no mass. There is tenderness in the right upper quadrant. There is guarding and positive Murphy's sign. There is no rebound and no CVA tenderness.  No rigidity, rebound or peritoneal signs No CVA tenderness  Musculoskeletal: Normal range of motion. She exhibits no edema.  Neurological: She is alert.  Speech is clear and goal oriented Moves extremities without ataxia  Skin: Skin is warm and dry. She is not diaphoretic.  Psychiatric: She has a normal mood and affect.    ED Course  Procedures (including critical care time) Labs Review Labs Reviewed  COMPREHENSIVE METABOLIC PANEL - Abnormal; Notable for the following:    Potassium 3.5 (*)    Total Protein 9.0 (*)    AST 43 (*)    All other components within normal limits  CBC WITH DIFFERENTIAL - Abnormal; Notable for the following:    Neutrophils Relative % 78 (*)    All other components within normal limits  LIPASE, BLOOD    Imaging Review US Abdomen Complete  09/07/2013   CLINICAL DATA:  Right upper quadrant pain  EXAM: ULTRASOUND ABDOMEN COMPLETE  COMPARISON:  02/11/2013  FINDINGS: Gallbladder:  Multiple gallstones are again identified and mobile within the gallbladder. No wall thickening is seen however a positive sonographic Murphy sign was elicited. No pericholecystic fluid is noted.  Common bile duct:  Diameter: 2.2 mm.  Liver:  No focal lesion identified. Within normal limits in parenchymal echogenicity.  IVC:  No abnormality visualized.  Pancreas:  Visualized portion unremarkable.  Spleen:  Size and appearance within normal limits.  Right Kidney:  Length: 10.7 cm.  A 9 mm cyst is  noted within the right kidney.  Left Kidney:  Length: 10.2 cm. Echogenicity within normal limits. No mass or hydronephrosis visualized.  Abdominal aorta:  No aneurysm visualized.  Other findings:  None.  IMPRESSION: Cholelithiasis with positive sonographic Murphy sign. In the appropriate clinical setting this would be consistent with acute cholecystitis.  Right renal cyst  No other focal abnormality is seen.   Electronically Signed   By: Inez Catalina M.D.   On: 09/07/2013 18:48     EKG Interpretation None      MDM   Final diagnoses:  Gallstones  RUQ abdominal pain  Nausea and vomiting   Vanessa Beard presents with RUQ abd pain, N/V.  Pt reports previous hx of this and repeated recommendations to have her gallbladder removed, but she has not done so.  Pt denies fever, chills.  She is alert, oriented, nontoxic and nonseptic appearing. She is not tachycardic. Will give pain control, check labs and obtain ultrasound.  7:31 PM Patient with pain control. No further emesis here in the department. Abdomen remained soft and this time is nontender.  We'll continue to monitor and by mouth trial.  [place] with gallstones and mobile. No wall thickening or  pericholecystic fluid noted. No dilated common bile duct.  Patient is afebrile, without leukocytosis or left shift noted. Highly doubt cholecystitis this time.  9:24 PM Pt resting comfortably.  She reports complete resolution of her abdominal pain and nausea. She is tolerating her fluids and crackers without difficulty. She's had no return of her pain or nausea or vomiting. She reports she feels back to normal and is ready to be discharged home. I discussed with her a week the importance of following up with her primary care physician as well as discussing possible cholecystectomy with [facility]. Patient is to return to the emergency department if her pain returns, is associated with high fevers or intractable vomiting.  It has been  determined that no acute conditions requiring further emergency intervention are present at this time. The patient/guardian have been advised of the diagnosis and plan. We have discussed signs and symptoms that warrant return to the ED, such as changes or worsening in symptoms.   Vital signs are stable at discharge.   BP 129/75  Pulse 70  Temp(Src) 98.4 F (36.9 C) (Rectal)  Resp 21  SpO2 98%  Patient/guardian has voiced understanding and agreed to follow-up with the PCP or specialist.      Abigail Butts, PA-C 09/07/13 2126

## 2013-09-07 NOTE — ED Notes (Signed)
Patient  Ultrasound at [facility]

## 2013-09-07 NOTE — ED Notes (Addendum)
Pt c/o RUQ abdominal pain and nausea starting this afternoon.  Pain score 10/10.  Hx of "gallbladder disease."  Pt began actively vomiting during Triage.  Reports "I drank milk that it caused this."

## 2013-09-07 NOTE — ED Provider Notes (Signed)
Medical screening examination/treatment/procedure(s) were performed by non-physician practitioner and as supervising physician I was immediately available for consultation/collaboration.   EKG Interpretation None       Virgel Manifold, MD 09/07/13 2325

## 2013-09-09 ENCOUNTER — Other Ambulatory Visit: Payer: Self-pay | Admitting: Licensed Clinical Social Worker

## 2013-09-09 DIAGNOSIS — G4700 Insomnia, unspecified: Secondary | ICD-10-CM

## 2013-09-09 MED ORDER — ZOLPIDEM TARTRATE 10 MG PO TABS: 10.0000 mg | ORAL_TABLET | Freq: Every evening | ORAL | Status: DC | PRN

## 2013-09-22 ENCOUNTER — Encounter: Payer: Self-pay | Admitting: Internal Medicine

## 2013-09-22 ENCOUNTER — Ambulatory Visit (INDEPENDENT_AMBULATORY_CARE_PROVIDER_SITE_OTHER): Payer: Medicaid Other | Admitting: Internal Medicine

## 2013-09-22 VITALS — BP 123/81 | HR 67 | Temp 97.0°F | Wt 150.0 lb

## 2013-09-22 DIAGNOSIS — R300 Dysuria: Secondary | ICD-10-CM

## 2013-09-22 DIAGNOSIS — B20 Human immunodeficiency virus [HIV] disease: Secondary | ICD-10-CM

## 2013-09-22 LAB — CBC WITH DIFFERENTIAL/PLATELET
Basophils Absolute: 0 10*3/uL (ref 0.0–0.1)
Basophils Relative: 0 % (ref 0–1)
Eosinophils Absolute: 0 10*3/uL (ref 0.0–0.7)
Eosinophils Relative: 1 % (ref 0–5)
HCT: 36 % (ref 36.0–46.0)
Hemoglobin: 12.4 g/dL (ref 12.0–15.0)
Lymphocytes Relative: 34 % (ref 12–46)
Lymphs Abs: 1.5 10*3/uL (ref 0.7–4.0)
MCH: 30.5 pg (ref 26.0–34.0)
MCHC: 34.4 g/dL (ref 30.0–36.0)
MCV: 88.7 fL (ref 78.0–100.0)
Monocytes Absolute: 0.3 10*3/uL (ref 0.1–1.0)
Monocytes Relative: 6 % (ref 3–12)
Neutro Abs: 2.7 10*3/uL (ref 1.7–7.7)
Neutrophils Relative %: 59 % (ref 43–77)
Platelets: 165 10*3/uL (ref 150–400)
RBC: 4.06 MIL/uL (ref 3.87–5.11)
RDW: 13.9 % (ref 11.5–15.5)
WBC: 4.5 10*3/uL (ref 4.0–10.5)

## 2013-09-22 LAB — COMPLETE METABOLIC PANEL WITH GFR
ALT: 12 U/L (ref 0–35)
AST: 22 U/L (ref 0–37)
Albumin: 3.6 g/dL (ref 3.5–5.2)
Alkaline Phosphatase: 77 U/L (ref 39–117)
BUN: 11 mg/dL (ref 6–23)
CO2: 27 mEq/L (ref 19–32)
Calcium: 8.7 mg/dL (ref 8.4–10.5)
Chloride: 106 mEq/L (ref 96–112)
Creat: 0.77 mg/dL (ref 0.50–1.10)
GFR, Est African American: >89 mL/min
GFR, Est Non African American: 82 mL/min
Glucose, Bld: 87 mg/dL (ref 70–99)
Potassium: 3.5 mEq/L (ref 3.5–5.3)
Sodium: 139 mEq/L (ref 135–145)
Total Bilirubin: 0.6 mg/dL (ref 0.2–1.2)
Total Protein: 7.4 g/dL (ref 6.0–8.3)

## 2013-09-22 NOTE — Progress Notes (Signed)
   Subjective:    Patient ID: Vanessa Beard, female    DOB: 17-Oct-1950, 63 y.o.   MRN: [id number]  [id number] F with HIV, well controled on triomeq. She comes to clinic to determine if she has uti, had a few days of dysuria but now improved after taking cranberry juice. No fever or chills or blood in urine. Also needs letter for financial aid for [employer]  Current Outpatient Prescriptions on File Prior to Visit  Medication Sig Dispense Refill  . Abacavir-Dolutegravir-Lamivud 600-50-300 MG TABS Take 1 tablet by mouth daily.  30 tablet  11  . amLODipine (NORVASC) 10 MG tablet Take 1 tablet (10 mg total) by mouth daily.  90 tablet  3  . HYDROcodone-acetaminophen (NORCO/VICODIN) 5-325 MG per tablet Take 1 tablet by mouth 2 (two) times daily as needed for moderate pain.  15 tablet  0  . ondansetron (ZOFRAN) 8 MG tablet Take 1 tablet (8 mg total) by mouth every 4 (four) hours as needed for nausea.  4 tablet  0  . oxyCODONE-acetaminophen (PERCOCET/ROXICET) 5-325 MG per tablet Take 2 tablets by mouth every 4 (four) hours as needed for severe pain.  6 tablet  0  . zolpidem (AMBIEN) 10 MG tablet Take 1 tablet (10 mg total) by mouth at bedtime as needed for sleep.  30 tablet  2   No current facility-administered medications on file prior to visit.       Review of Systems 10 point ros is negative except for stress    Objective:   Physical Exam BP 123/81  Pulse 67  Temp(Src) 97 F (36.1 C) (Oral)  Wt 150 lb (68.04 kg) gen = a x oby 3 in nad Heent= Vanessa Beard/at, perrla, eomi, no scleral icterus Skin = no rash       Assessment & Plan:  hiv = well controlled. Will check labs today  Dysuria = will check ua nad urine cx

## 2013-09-23 LAB — URINALYSIS, MICROSCOPIC ONLY
Casts: NONE SEEN
Crystals: NONE SEEN

## 2013-09-23 LAB — URINALYSIS, ROUTINE W REFLEX MICROSCOPIC
Bilirubin Urine: NEGATIVE
Glucose, UA: NEGATIVE mg/dL
Hgb urine dipstick: NEGATIVE
Ketones, ur: NEGATIVE mg/dL
Nitrite: NEGATIVE
Protein, ur: NEGATIVE mg/dL
Specific Gravity, Urine: 1.016 (ref 1.005–1.030)
Urobilinogen, UA: 2 mg/dL — ABNORMAL HIGH (ref 0.0–1.0)
pH: 7.5 (ref 5.0–8.0)

## 2013-09-23 LAB — T-HELPER CELL (CD4) - (RCID CLINIC ONLY)
CD4 % Helper T Cell: 25 % — ABNORMAL LOW (ref 33–55)
CD4 T Cell Abs: 370 /uL — ABNORMAL LOW (ref 400–2700)

## 2013-09-24 LAB — HIV-1 RNA QUANT-NO REFLEX-BLD
HIV 1 RNA Quant: <20 copies/mL (ref <20)
HIV-1 RNA Quant, Log: <1.3 {Log} (ref <1.30)

## 2013-09-25 LAB — URINE CULTURE: Colony Count: >=100000

## 2013-09-26 ENCOUNTER — Encounter: Payer: Self-pay | Admitting: *Deleted

## 2013-09-26 ENCOUNTER — Other Ambulatory Visit: Payer: Self-pay | Admitting: Internal Medicine

## 2013-09-26 DIAGNOSIS — N390 Urinary tract infection, site not specified: Secondary | ICD-10-CM

## 2013-09-26 LAB — TB SKIN TEST
Induration: 0 mm
TB Skin Test: NEGATIVE

## 2013-09-26 MED ORDER — CEPHALEXIN 500 MG PO CAPS: 500.0000 mg | ORAL_CAPSULE | Freq: Four times a day (QID) | ORAL | Status: DC

## 2013-09-26 NOTE — Addendum Note (Signed)
Addended by: Janyce Llanos F on: 09/26/2013 11:42 AM   Modules accepted: Orders

## 2013-09-27 ENCOUNTER — Ambulatory Visit (HOSPITAL_COMMUNITY)
Admission: RE | Admit: 2013-09-27 | Discharge: 2013-09-27 | Disposition: A | Discharge status: Home / Self Care | Payer: 59 | Source: Ambulatory Visit | Attending: Internal Medicine | Admitting: Internal Medicine

## 2013-09-27 DIAGNOSIS — Z1231 Encounter for screening mammogram for malignant neoplasm of breast: Secondary | ICD-10-CM | POA: Insufficient documentation

## 2013-10-11 ENCOUNTER — Other Ambulatory Visit: Payer: Self-pay | Admitting: *Deleted

## 2013-10-11 DIAGNOSIS — I10 Essential (primary) hypertension: Secondary | ICD-10-CM

## 2013-10-11 MED ORDER — AMLODIPINE BESYLATE 10 MG PO TABS: 10.0000 mg | ORAL_TABLET | Freq: Every day | ORAL | Status: DC

## 2013-10-31 ENCOUNTER — Other Ambulatory Visit (HOSPITAL_COMMUNITY)
Admission: RE | Admit: 2013-10-31 | Discharge: 2013-10-31 | Disposition: A | Discharge status: Home / Self Care | Payer: PRIVATE HEALTH INSURANCE | Source: Ambulatory Visit | Attending: Family Medicine | Admitting: Family Medicine

## 2013-10-31 ENCOUNTER — Encounter: Payer: Self-pay | Admitting: Family Medicine

## 2013-10-31 ENCOUNTER — Ambulatory Visit (INDEPENDENT_AMBULATORY_CARE_PROVIDER_SITE_OTHER): Payer: Medicare Other | Admitting: Family Medicine

## 2013-10-31 VITALS — BP 134/93 | HR 72 | Temp 97.9°F | Ht 60.0 in | Wt 147.1 lb

## 2013-10-31 DIAGNOSIS — R87610 Atypical squamous cells of undetermined significance on cytologic smear of cervix (ASC-US): Secondary | ICD-10-CM | POA: Insufficient documentation

## 2013-10-31 DIAGNOSIS — B977 Papillomavirus as the cause of diseases classified elsewhere: Secondary | ICD-10-CM | POA: Diagnosis present

## 2013-10-31 DIAGNOSIS — R6889 Other general symptoms and signs: Secondary | ICD-10-CM

## 2013-10-31 DIAGNOSIS — IMO0002 Reserved for concepts with insufficient information to code with codable children: Secondary | ICD-10-CM

## 2013-10-31 LAB — POCT PREGNANCY, URINE: Preg Test, Ur: NEGATIVE

## 2013-10-31 NOTE — Progress Notes (Signed)
S) Patient is a 63 y.o. y/o [id number] who presents for colposcopy in the context of recent PAP revealing ASCUS/HPV+ - last period 20 years ago Reports hx of cervical procedure in her 92s, pt has not had a child since that time - Hx of HIV  O) Vital signs reviewed; afebrile Gen:  WD, WN, NAD, A&Ox3, pleasant LAB: HCG today is negative Colposcopy Procedure Note  Procedure: Colposcopy Consent: The risks and benefits of the procedure were discussed with the patient. Risks include acute and chronic abdominal and/or pelvic pain, infection (minimized by sterile technique), bleeding (internal and external), and need for subsequent procedure to correct a complication or further treat symptoms. Rarely, damage to internal structures may occur requiring subsequent procedures or, exceedingly rare, repair or removal of the uterus. Benefits may include further evaluation and diagnosis of cervical pathology. The alternatives were explained to the patient including: not doing procedure or postponing procedure. The disadvantages to not doing the procedure were discussed with the patent, including missed diagnosis of cervical cancer The written consent form has been signed and placed in the patient's medical record The patient voiced understanding of the procedure and agreed to proceed. Indication: Cervical dysplasia work up per ASCCP Guidelines Physicians:  Dr. Leamon Arnt Description in detail:   Chaperone present for exam Lovena Le A timeout was completed before the start of the procedure - site verified and documented in the chart.  External genitalia w/o visible lesions. Speculum exam revealed normal appearing cervix.  The cervix was examined using high powered microscopy  Acetic acid applied to cervix - hypervascular at 3oclock  Lugol's iodine solution applied to cervix - decreased uptake at 6 oclock  The entire transformation zone / squamocolumnar junction was not visualized.  Cervical Os was stenosed,  unable to perform ECC or cervical speculum  Biopsies completed at 3 and 6 oclock  EBL: Minimal, <7mL; hemostasis achieved prior to removing speculum Complications: None; Pt. tolerated procedure well. Instructions to patient: Return to clinic vs. ER, depending on severity, with fevers,   increasing pain, or difficulty walking. Expect mild soreness and spotting for one day.  Call clinic with other questions. Post-procedure handout given to patient. Nothing in vagina for two weeks. 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 Will call pt with results and follow ASCCP guidelines. NOTE: Bx pending  Regardless - HIV continue annual pap smears

## 2013-10-31 NOTE — Patient Instructions (Signed)
Colposcopy  Colposcopy is a procedure to examine your cervix and vagina, or the area around the outside of your vagina, for abnormalities or signs of disease. The procedure is done using a lighted microscope called a colposcope. Tissue samples may be collected during the colposcopy if your health care provider finds any unusual cells. A colposcopy may be done if a woman has:   An abnormal Pap test. A Pap test is a medical test done to evaluate cells that are on the surface of the cervix.   A Pap test result that is suggestive of human papillomavirus (HPV). This virus can cause genital warts and is linked to the development of cervical cancer.   A sore on her cervix and the results of a Pap test were normal.   Genital warts on the cervix or in or around the outside of the vagina.   A mother who took the drug diethylstilbestrol (DES) while pregnant.   Painful intercourse.   Vaginal bleeding, especially after sexual intercourse.  LET YOUR HEALTH CARE PROVIDER KNOW ABOUT:   Any allergies you have.   All medicines you are taking, including vitamins, herbs, eye drops, creams, and over-the-counter medicines.   Previous problems you or members of your family have had with the use of anesthetics.   Any blood disorders you have.   Previous surgeries you have had.   Medical conditions you have.  RISKS AND COMPLICATIONS  Generally, a colposcopy is a safe procedure. However, as with any procedure, complications can occur. Possible complications include:   Bleeding.   Infection.   Missed lesions.  BEFORE THE PROCEDURE    Tell your health care provider if you have your menstrual period. A colposcopy typically is not done during menstruation.   For 24 hours before the colposcopy, do not:   D[place]che.   Use tampons.   Use medicines, creams, or suppositories in the vagina.   Have sexual intercourse.  PROCEDURE   During the procedure, you will be lying on your back with your feet in foot rests (stirrups). A warm  metal or plastic instrument (speculum) will be placed in your vagina to keep it open and to allow the health care provider to see the cervix. The colposcope will be placed outside the vagina. It will be used to magnify and examine the cervix, vagina, and the area around the outside of the vagina. A small amount of liquid solution will be placed on the area that is to be viewed. This solution will make it easier to see the abnormal cells. Your health care provider will use tools to suck out mucus and cells from the canal of the cervix. Then he or she will record the location of the abnormal areas.  If a biopsy is done during the procedure, a medicine will usually be given to numb the area (local anesthetic). You may feel mild pain or cramping while the biopsy is done. After the procedure, tissue samples collected during the biopsy will be sent to a lab for analysis.  AFTER THE PROCEDURE   You will be given instructions on when to follow up with your health care provider for your test results. It is important to keep your appointment.  Document Released: 07/26/2002 Document Revised: 01/05/2013 Document Reviewed: 12/02/2012  [employer] Patient Information 2014 [employer].

## 2013-11-16 ENCOUNTER — Encounter: Payer: Self-pay | Admitting: Family Medicine

## 2013-11-17 ENCOUNTER — Telehealth: Payer: Self-pay | Admitting: *Deleted

## 2013-11-17 NOTE — Telephone Encounter (Signed)
Called Ellanie and explained results and reccomendation to repeat pap and cotesting in one year. Kari voiced understanding.

## 2013-11-17 NOTE — Telephone Encounter (Signed)
Message copied by Samuel Germany on Thu Nov 17, 2013 10:13 AM ------      Message from: Leamon Arnt R      Created: Wed Nov 16, 2013  7:29 PM       Pt has cervical findings consistent with HPV but not meeting criteria for more intervention at this time. Needs to have repeat testing in 12 months.             Cheers. ------

## 2013-12-05 ENCOUNTER — Emergency Department (HOSPITAL_COMMUNITY): Payer: Medicare Other

## 2013-12-05 ENCOUNTER — Encounter (HOSPITAL_COMMUNITY): Payer: Self-pay | Admitting: Emergency Medicine

## 2013-12-05 ENCOUNTER — Emergency Department (HOSPITAL_COMMUNITY)
Admission: EM | Admit: 2013-12-05 | Discharge: 2013-12-06 | Disposition: A | Discharge status: Home / Self Care | Payer: Medicare Other | Attending: Emergency Medicine | Admitting: Emergency Medicine

## 2013-12-05 DIAGNOSIS — Z8739 Personal history of other diseases of the musculoskeletal system and connective tissue: Secondary | ICD-10-CM | POA: Insufficient documentation

## 2013-12-05 DIAGNOSIS — K8010 Calculus of gallbladder with chronic cholecystitis without obstruction: Secondary | ICD-10-CM | POA: Insufficient documentation

## 2013-12-05 DIAGNOSIS — Z21 Asymptomatic human immunodeficiency virus [HIV] infection status: Secondary | ICD-10-CM | POA: Insufficient documentation

## 2013-12-05 DIAGNOSIS — K8000 Calculus of gallbladder with acute cholecystitis without obstruction: Secondary | ICD-10-CM | POA: Insufficient documentation

## 2013-12-05 DIAGNOSIS — Z79899 Other long term (current) drug therapy: Secondary | ICD-10-CM | POA: Diagnosis not present

## 2013-12-05 DIAGNOSIS — K8012 Calculus of gallbladder with acute and chronic cholecystitis without obstruction: Secondary | ICD-10-CM

## 2013-12-05 DIAGNOSIS — R1011 Right upper quadrant pain: Secondary | ICD-10-CM | POA: Diagnosis present

## 2013-12-05 LAB — CBC WITH DIFFERENTIAL/PLATELET
Basophils Absolute: 0 10*3/uL (ref 0.0–0.1)
Basophils Relative: 0 % (ref 0–1)
Eosinophils Absolute: 0.1 10*3/uL (ref 0.0–0.7)
Eosinophils Relative: 1 % (ref 0–5)
HCT: 38.4 % (ref 36.0–46.0)
Hemoglobin: 13 g/dL (ref 12.0–15.0)
Lymphocytes Relative: 38 % (ref 12–46)
Lymphs Abs: 2.3 10*3/uL (ref 0.7–4.0)
MCH: 31.3 pg (ref 26.0–34.0)
MCHC: 33.9 g/dL (ref 30.0–36.0)
MCV: 92.5 fL (ref 78.0–100.0)
Monocytes Absolute: 0.4 10*3/uL (ref 0.1–1.0)
Monocytes Relative: 7 % (ref 3–12)
Neutro Abs: 3.3 10*3/uL (ref 1.7–7.7)
Neutrophils Relative %: 54 % (ref 43–77)
Platelets: 172 10*3/uL (ref 150–400)
RBC: 4.15 MIL/uL (ref 3.87–5.11)
RDW: 13.4 % (ref 11.5–15.5)
WBC: 6.1 10*3/uL (ref 4.0–10.5)

## 2013-12-05 LAB — URINALYSIS, ROUTINE W REFLEX MICROSCOPIC
Bilirubin Urine: NEGATIVE
Glucose, UA: NEGATIVE mg/dL
Hgb urine dipstick: NEGATIVE
Ketones, ur: NEGATIVE mg/dL
Leukocytes, UA: NEGATIVE
Nitrite: NEGATIVE
Protein, ur: NEGATIVE mg/dL
Specific Gravity, Urine: 1.017 (ref 1.005–1.030)
Urobilinogen, UA: 1 mg/dL (ref 0.0–1.0)
pH: 7.5 (ref 5.0–8.0)

## 2013-12-05 LAB — COMPREHENSIVE METABOLIC PANEL
ALT: 12 U/L (ref 0–35)
AST: 27 U/L (ref 0–37)
Albumin: 3.8 g/dL (ref 3.5–5.2)
Alkaline Phosphatase: 108 U/L (ref 39–117)
Anion gap: 14 (ref 5–15)
BUN: 12 mg/dL (ref 6–23)
CO2: 23 mEq/L (ref 19–32)
Calcium: 9.4 mg/dL (ref 8.4–10.5)
Chloride: 103 mEq/L (ref 96–112)
Creatinine, Ser: 0.86 mg/dL (ref 0.50–1.10)
GFR calc Af Amer: 81 mL/min — ABNORMAL LOW (ref >90)
GFR calc non Af Amer: 70 mL/min — ABNORMAL LOW (ref >90)
Glucose, Bld: 98 mg/dL (ref 70–99)
Potassium: 3.4 mEq/L — ABNORMAL LOW (ref 3.7–5.3)
Sodium: 140 mEq/L (ref 137–147)
Total Bilirubin: 0.8 mg/dL (ref 0.3–1.2)
Total Protein: 8.7 g/dL — ABNORMAL HIGH (ref 6.0–8.3)

## 2013-12-05 LAB — LIPASE, BLOOD: Lipase: 54 U/L (ref 11–59)

## 2013-12-05 MED ORDER — ONDANSETRON HCL 4 MG/2ML IJ SOLN
4.0000 mg | Freq: Once | INTRAMUSCULAR | Status: AC
  Administered 2013-12-05: 4 mg via INTRAVENOUS
  Filled 2013-12-05: qty 2

## 2013-12-05 MED ORDER — SODIUM CHLORIDE 0.9 % IV BOLUS (SEPSIS)
1000.0000 mL | Freq: Once | INTRAVENOUS | Status: AC
  Administered 2013-12-05: 1000 mL via INTRAVENOUS

## 2013-12-05 MED ORDER — HYDROMORPHONE HCL PF 1 MG/ML IJ SOLN
0.5000 mg | Freq: Once | INTRAMUSCULAR | Status: AC
  Administered 2013-12-05: 0.5 mg via INTRAVENOUS
  Filled 2013-12-05: qty 1

## 2013-12-05 NOTE — ED Provider Notes (Signed)
CSN: [id number]     Arrival date & time 12/05/13  2039 History   First MD Initiated Contact with Patient 12/05/13 2147     Chief Complaint  Patient presents with  . Abdominal Pain     (Consider location/radiation/quality/duration/timing/severity/associated sxs/prior Treatment) HPI Comments: Patient presents with right upper abdominal pain. She has a history of gallstones and has had recurrent episodes of gallbladder attacks. 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 She started having some pain this evening after she ate a meatball sandwich. She has severe or sharp pain in her upper abdomen. She's had some nausea but no vomiting. She denies any fevers or chills. The pain has been constant and worsening since it started. She says the pain feels like her other gallbladder attacks. She has not had an attack in several months.  Patient is a 63 y.o. female presenting with abdominal pain.  Abdominal Pain Associated symptoms: nausea   Associated symptoms: no chest pain, no chills, no cough, no diarrhea, no fatigue, no fever, no hematuria, no shortness of breath and no vomiting     Past Medical History  Diagnosis Date  . HIV infection   . Osteoporosis   . Gall bladder disease \   Past Surgical History  Procedure Laterality Date  . Ectopic pregnancy surgery    . Shoulder surgery Right    Family History  Problem Relation Age of Onset  . Diabetes Mother   . Hypertension Mother    History  Substance Use Topics  . Smoking status: Never Smoker   . Smokeless tobacco: Never Used  . Alcohol Use: 1.5 oz/week    3 drink(s) per week     Comment: wine or [place] alcohol   OB History   Grav Para Term Preterm Abortions TAB SAB Ect Mult Living   5 3 3  0 2 1 0 1 0 2     Review of Systems  Constitutional: Negative for fever, chills, diaphoresis and fatigue.  HENT: Negative for congestion, rhinorrhea and sneezing.   Eyes: Negative.    Respiratory: Negative for cough, chest tightness and shortness of breath.   Cardiovascular: Negative for chest pain and leg swelling.  Gastrointestinal: Positive for nausea and abdominal pain. Negative for vomiting, diarrhea and blood in stool.  Genitourinary: Negative for frequency, hematuria, flank pain and difficulty urinating.  Musculoskeletal: Negative for arthralgias and back pain.  Skin: Negative for rash.  Neurological: Negative for dizziness, speech difficulty, weakness, numbness and headaches.      Allergies  Dapsone; Retrovir; and Sulfamethoxazole-trimethoprim  Home Medications   Prior to Admission medications   Medication Sig Start Date End Date Taking? Authorizing Provider  Abacavir-Dolutegravir-Lamivud [phone] MG TABS Take 1 tablet by mouth daily. 03/01/13  Yes Carlyle Basques, MD  amLODipine (NORVASC) 10 MG tablet Take 1 tablet (10 mg total) by mouth daily. 10/11/13  Yes Carlyle Basques, MD  HYDROcodone-acetaminophen (NORCO/VICODIN) 5-325 MG per tablet Take 1 tablet by mouth 2 (two) times daily as needed for moderate pain. 05/11/13  Yes Reyne Dumas, MD  zolpidem (AMBIEN) 10 MG tablet Take 1 tablet (10 mg total) by mouth at bedtime as needed for sleep. 09/09/13  Yes Carlyle Basques, MD  ondansetron (ZOFRAN ODT) 8 MG disintegrating tablet 8mg  ODT q4 hours prn nausea 12/06/13   Malvin Johns, MD  oxyCODONE-acetaminophen (PERCOCET) 5-325 MG per tablet Take 1-2 tablets by mouth every 4 (four) hours as needed. 12/06/13   Malvin Johns, MD  BP 148/119  Pulse 72  Temp(Src) 98.1 F (36.7 C) (Oral)  Resp 18  Ht 5' (1.524 m)  Wt 142 lb (64.411 kg)  BMI 27.73 kg/m2  SpO2 99% Physical Exam  Constitutional: She is oriented to person, place, and time. She appears well-developed and well-nourished.  HENT:  Head: Normocephalic and atraumatic.  Eyes: Pupils are equal, round, and reactive to light.  Neck: Normal range of motion. Neck supple.  Cardiovascular: Normal rate, regular  rhythm and normal heart sounds.   Pulmonary/Chest: Effort normal and breath sounds normal. No respiratory distress. She has no wheezes. She has no rales. She exhibits no tenderness.  Abdominal: Soft. Bowel sounds are normal. There is tenderness (marked TTP RUQ). There is no rebound and no guarding.  Musculoskeletal: Normal range of motion. She exhibits no edema.  Lymphadenopathy:    She has no cervical adenopathy.  Neurological: She is alert and oriented to person, place, and time.  Skin: Skin is warm and dry. No rash noted.  Psychiatric: She has a normal mood and affect.    ED Course  Procedures (including critical care time) Labs Review Results for orders placed during the hospital encounter of 12/05/13  CBC WITH DIFFERENTIAL      Result Value Ref Range   WBC 6.1  4.0 - 10.5 K/uL   RBC 4.15  3.87 - 5.11 MIL/uL   Hemoglobin 13.0  12.0 - 15.0 g/dL   HCT 38.4  36.0 - 46.0 %   MCV 92.5  78.0 - 100.0 fL   MCH 31.3  26.0 - 34.0 pg   MCHC 33.9  30.0 - 36.0 g/dL   RDW 13.4  11.5 - 15.5 %   Platelets 172  150 - 400 K/uL   Neutrophils Relative % 54  43 - 77 %   Neutro Abs 3.3  1.7 - 7.7 K/uL   Lymphocytes Relative 38  12 - 46 %   Lymphs Abs 2.3  0.7 - 4.0 K/uL   Monocytes Relative 7  3 - 12 %   Monocytes Absolute 0.4  0.1 - 1.0 K/uL   Eosinophils Relative 1  0 - 5 %   Eosinophils Absolute 0.1  0.0 - 0.7 K/uL   Basophils Relative 0  0 - 1 %   Basophils Absolute 0.0  0.0 - 0.1 K/uL  COMPREHENSIVE METABOLIC PANEL      Result Value Ref Range   Sodium 140  137 - 147 mEq/L   Potassium 3.4 (*) 3.7 - 5.3 mEq/L   Chloride 103  96 - 112 mEq/L   CO2 23  19 - 32 mEq/L   Glucose, Bld 98  70 - 99 mg/dL   BUN 12  6 - 23 mg/dL   Creatinine, Ser 0.86  0.50 - 1.10 mg/dL   Calcium 9.4  8.4 - 10.5 mg/dL   Total Protein 8.7 (*) 6.0 - 8.3 g/dL   Albumin 3.8  3.5 - 5.2 g/dL   AST 27  0 - 37 U/L   ALT 12  0 - 35 U/L   Alkaline Phosphatase 108  39 - 117 U/L   Total Bilirubin 0.8  0.3 - 1.2 mg/dL    GFR calc non Af Amer 70 (*) >90 mL/min   GFR calc Af Amer 81 (*) >90 mL/min   Anion gap 14  5 - 15  LIPASE, BLOOD      Result Value Ref Range   Lipase 54  11 - 59 U/L  URINALYSIS, ROUTINE W REFLEX  MICROSCOPIC      Result Value Ref Range   Color, Urine YELLOW  YELLOW   APPearance CLOUDY (*) CLEAR   Specific Gravity, Urine 1.017  1.005 - 1.030   pH 7.5  5.0 - 8.0   Glucose, UA NEGATIVE  NEGATIVE mg/dL   Hgb urine dipstick NEGATIVE  NEGATIVE   Bilirubin Urine NEGATIVE  NEGATIVE   Ketones, ur NEGATIVE  NEGATIVE mg/dL   Protein, ur NEGATIVE  NEGATIVE mg/dL   Urobilinogen, UA 1.0  0.0 - 1.0 mg/dL   Nitrite NEGATIVE  NEGATIVE   Leukocytes, UA NEGATIVE  NEGATIVE   No results found.    Imaging Review US Abdomen Complete  12/05/2013   CLINICAL DATA:  Upper abdominal pain.  History of gallstones.  EXAM: ULTRASOUND ABDOMEN COMPLETE  COMPARISON:  Prior examinations 09/07/2013 and 02/11/2013.  FINDINGS: Gallbladder:  Multiple gallstones are again noted, the largest measuring approximately 8 mm. There is no gallbladder wall thickening or sonographic Murphy's sign.  Common bile duct:  Diameter: 8.4 mm. No evidence of choledocholithiasis. There is mild intrahepatic biliary dilatation. This biliary dilatation appears new.  Liver:  No focal lesion identified. Within normal limits in parenchymal echogenicity.  IVC:  No abnormality visualized.  Pancreas:  The visualized portions appear unremarkable. The pancreatic duct is minimally prominent without significant dilatation.  Spleen:  Size and appearance within normal limits.  Right Kidney:  Length: 9.6 cm. There is small cyst in the upper pole, measuring 9 mm maximally. No hydronephrosis or suspicious lesion.  Left Kidney:  Length: 10.0 cm. Echogenicity within normal limits. No mass or hydronephrosis visualized.  Abdominal aorta:  No aneurysm visualized.  Other findings:  None.  IMPRESSION: 1. New intra and extrahepatic biliary dilatation in this patient with  known cholelithiasis, suspicious for possible choledocholithiasis. This could be further evaluated with MRCP as clinically warranted. 2. No evidence of cholecystitis on the current examination. Multiple gallstones again noted.   Electronically Signed   By: Camie Patience M.D.   On: 12/05/2013 23:44     EKG Interpretation None      MDM   Final diagnoses:  Calculus of gallbladder with acute on chronic cholecystitis without obstruction    PT with recurrent abd pain related to her gallstones.  No evidence of cholecystitis.  There is mild biliary duct dilitation, but her LFTs and lipase are normal which would go against her having an obstructing stone.  Pt was given one dose of pain meds and her pain is well controlled.  She was discharge home.  I did encourage her to have close f/u with GI to assess for need for ERCP and to f/u with Dr. Lucia Gaskins, her surgeon regarding having a cholecystectomy.  She was advised to return for worsening pain, vomiting, fevers, jaundice.    Malvin Johns, MD 12/06/13 [phone]

## 2013-12-05 NOTE — ED Notes (Signed)
Pt complains of gallbladder pain since earlier today

## 2013-12-06 MED ORDER — OXYCODONE-ACETAMINOPHEN 5-325 MG PO TABS: 1.0000 | ORAL_TABLET | ORAL | Status: DC | PRN

## 2013-12-06 MED ORDER — ONDANSETRON 8 MG PO TBDP: ORAL_TABLET | ORAL | Status: DC

## 2013-12-06 NOTE — ED Notes (Signed)
Went in to discharge pt, learned that pt drove to the ED.  Pt made aware that she was given a narcotic at 2217 and has to wait 4 hours after the administration time before pt can leave the ED.  Pt states that she is okay and that she is more than capable of driving home.  Terri CN aware of the situation.  Went back in the room with Wynetta Emery [employer] to talk to pt.  Explained to pt that if she left the ED now, she will be putting herself at risk of getting charged with a DUI if something happens between here and her home.  She then reports that her mother had called her while she was here stating her O2 is not working because she cannot breathe.  She states that she wants to go home to check on her mother.  [employer] and this nurse offered to have a medical personal or [employer] sent to her home to check on her mother, pt states no one can get in her home but her because she has the code to her home.  Johnson [employer] states "so you don't trust me enough to give me the code to your house to check on your mother."  Pt states "no, i'll just stay here for another 2 hours."

## 2013-12-09 ENCOUNTER — Other Ambulatory Visit: Payer: Self-pay | Admitting: Licensed Clinical Social Worker

## 2013-12-09 DIAGNOSIS — G4700 Insomnia, unspecified: Secondary | ICD-10-CM

## 2013-12-09 MED ORDER — ZOLPIDEM TARTRATE 10 MG PO TABS: 10.0000 mg | ORAL_TABLET | Freq: Every evening | ORAL | Status: DC | PRN

## 2013-12-29 ENCOUNTER — Encounter: Payer: Self-pay | Admitting: Internal Medicine

## 2013-12-29 ENCOUNTER — Ambulatory Visit (INDEPENDENT_AMBULATORY_CARE_PROVIDER_SITE_OTHER): Payer: Commercial Managed Care - HMO | Admitting: Internal Medicine

## 2013-12-29 VITALS — BP 126/85 | HR 82 | Temp 98.1°F | Wt 144.0 lb

## 2013-12-29 DIAGNOSIS — B20 Human immunodeficiency virus [HIV] disease: Secondary | ICD-10-CM

## 2013-12-29 DIAGNOSIS — L0292 Furuncle, unspecified: Secondary | ICD-10-CM

## 2013-12-29 DIAGNOSIS — B182 Chronic viral hepatitis C: Secondary | ICD-10-CM

## 2013-12-29 DIAGNOSIS — L0293 Carbuncle, unspecified: Secondary | ICD-10-CM

## 2013-12-29 LAB — LIPID PANEL
Cholesterol: 142 mg/dL (ref 0–200)
HDL: 53 mg/dL (ref >39)
LDL Cholesterol: 73 mg/dL (ref 0–99)
Total CHOL/HDL Ratio: 2.7 Ratio
Triglycerides: 80 mg/dL (ref <150)
VLDL: 16 mg/dL (ref 0–40)

## 2013-12-29 LAB — CBC WITH DIFFERENTIAL/PLATELET
Basophils Absolute: 0 10*3/uL (ref 0.0–0.1)
Basophils Relative: 1 % (ref 0–1)
Eosinophils Absolute: 0 10*3/uL (ref 0.0–0.7)
Eosinophils Relative: 1 % (ref 0–5)
HCT: 34.6 % — ABNORMAL LOW (ref 36.0–46.0)
Hemoglobin: 11.9 g/dL — ABNORMAL LOW (ref 12.0–15.0)
Lymphocytes Relative: 28 % (ref 12–46)
Lymphs Abs: 1.2 10*3/uL (ref 0.7–4.0)
MCH: 31 pg (ref 26.0–34.0)
MCHC: 34.4 g/dL (ref 30.0–36.0)
MCV: 90.1 fL (ref 78.0–100.0)
Monocytes Absolute: 0.3 10*3/uL (ref 0.1–1.0)
Monocytes Relative: 6 % (ref 3–12)
Neutro Abs: 2.8 10*3/uL (ref 1.7–7.7)
Neutrophils Relative %: 64 % (ref 43–77)
Platelets: 184 10*3/uL (ref 150–400)
RBC: 3.84 MIL/uL — ABNORMAL LOW (ref 3.87–5.11)
RDW: 14.1 % (ref 11.5–15.5)
WBC: 4.4 10*3/uL (ref 4.0–10.5)

## 2013-12-29 LAB — COMPLETE METABOLIC PANEL WITH GFR
ALT: 9 U/L (ref 0–35)
AST: 20 U/L (ref 0–37)
Albumin: 3.9 g/dL (ref 3.5–5.2)
Alkaline Phosphatase: 83 U/L (ref 39–117)
BUN: 10 mg/dL (ref 6–23)
CO2: 26 mEq/L (ref 19–32)
Calcium: 9.1 mg/dL (ref 8.4–10.5)
Chloride: 107 mEq/L (ref 96–112)
Creat: 0.7 mg/dL (ref 0.50–1.10)
GFR, Est African American: >89 mL/min
GFR, Est Non African American: >89 mL/min
Glucose, Bld: 86 mg/dL (ref 70–99)
Potassium: 3.3 mEq/L — ABNORMAL LOW (ref 3.5–5.3)
Sodium: 139 mEq/L (ref 135–145)
Total Bilirubin: 0.5 mg/dL (ref 0.2–1.2)
Total Protein: 7.6 g/dL (ref 6.0–8.3)

## 2013-12-29 MED ORDER — DOXYCYCLINE HYCLATE 100 MG PO TABS: 100.0000 mg | ORAL_TABLET | Freq: Two times a day (BID) | ORAL | Status: DC

## 2013-12-30 ENCOUNTER — Other Ambulatory Visit: Payer: Self-pay | Admitting: Licensed Clinical Social Worker

## 2013-12-30 DIAGNOSIS — L0292 Furuncle, unspecified: Secondary | ICD-10-CM

## 2013-12-30 LAB — RPR

## 2013-12-30 LAB — T-HELPER CELL (CD4) - (RCID CLINIC ONLY)
CD4 % Helper T Cell: 27 % — ABNORMAL LOW (ref 33–55)
CD4 T Cell Abs: 330 /uL — ABNORMAL LOW (ref 400–2700)

## 2013-12-30 LAB — HEPATITIS B SURFACE ANTIBODY,QUALITATIVE: Hep B S Ab: NEGATIVE

## 2013-12-30 MED ORDER — DOXYCYCLINE HYCLATE 100 MG PO TABS: 100.0000 mg | ORAL_TABLET | Freq: Two times a day (BID) | ORAL | Status: DC

## 2014-01-02 LAB — HEPATITIS C RNA QUANTITATIVE
HCV Quantitative Log: 6.53 {Log} — ABNORMAL HIGH (ref <1.18)
HCV Quantitative: 3385009 IU/mL — ABNORMAL HIGH (ref <15)

## 2014-01-02 LAB — HIV-1 RNA QUANT-NO REFLEX-BLD
HIV 1 RNA Quant: <20 copies/mL (ref <20)
HIV-1 RNA Quant, Log: <1.3 {Log} (ref <1.30)

## 2014-01-04 ENCOUNTER — Telehealth: Payer: Self-pay | Admitting: Internal Medicine

## 2014-01-04 ENCOUNTER — Ambulatory Visit (INDEPENDENT_AMBULATORY_CARE_PROVIDER_SITE_OTHER): Payer: Commercial Managed Care - HMO | Admitting: Surgery

## 2014-01-04 NOTE — Telephone Encounter (Signed)
Pt. Would like a referral to be made with Dr. Lucia Gaskins (gastro...)......Marland KitchenPlease call patient if this can be done without coming in to see the doctor

## 2014-01-05 LAB — HEPATITIS C GENOTYPE

## 2014-01-05 NOTE — Progress Notes (Signed)
Patient ID: Vanessa Beard, female   DOB: [birth year], 63 y.o.   MRN: [id number]       Patient ID: Vanessa Beard, female   DOB: Feb 01, 1951, 63 y.o.   MRN: [id number]  HPI CD 4 count of 330/VL<20 on triomeq, doing well with great adherence. She denies any difficulty with meds. Going back to school at [employer].  Outpatient Encounter Prescriptions as of 12/29/2013  Medication Sig  . Abacavir-Dolutegravir-Lamivud [id number] MG TABS Take 1 tablet by mouth daily.  Marland Kitchen amLODipine (NORVASC) 10 MG tablet Take 1 tablet (10 mg total) by mouth daily.  Marland Kitchen HYDROcodone-acetaminophen (NORCO/VICODIN) 5-325 MG per tablet Take 1 tablet by mouth 2 (two) times daily as needed for moderate pain.  Marland Kitchen ondansetron (ZOFRAN ODT) 8 MG disintegrating tablet 8mg  ODT q4 hours prn nausea  . zolpidem (AMBIEN) 10 MG tablet Take 1 tablet (10 mg total) by mouth at bedtime as needed for sleep.  Marland Kitchen oxyCODONE-acetaminophen (PERCOCET) 5-325 MG per tablet Take 1-2 tablets by mouth every 4 (four) hours as needed.  . [DISCONTINUED] doxycycline (VIBRA-TABS) 100 MG tablet Take 1 tablet (100 mg total) by mouth 2 (two) times daily.     Patient Active Problem List   Diagnosis Date Noted  . ASCUS with positive high risk HPV 10/31/2013  . Gall bladder disease 02/03/2013  . 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  . Compression fracture of L3 lumbar vertebra 07/01/2011  . Thyroid dysfunction 07/01/2011  . Anemia 07/01/2011  . Sore throat 04/02/2011  . Hyperparathyroidism 02/10/2011  . Osteopenia 02/03/2011  . PAP SMEAR, LGSIL, ABNORMAL 12/21/2009  . COUGH 07/18/2009  . HEPATITIS C, CHRONIC 12/19/2008  . LEUKORRHEA 04/07/2008  . FATIGUE 04/07/2008  . BACK PAIN, LUMBAR 11/10/2007  . ALLERGIC RHINITIS 07/09/2007  . PNEUMOCYSTIS PNEUMONIA 05/07/2007  . HYPERTENSION 05/07/2007  . DENTAL CARIES 05/07/2007  . INSOMNIA 05/07/2007  . PNEUMONIA, HX OF 05/07/2007  . HERPES ZOSTER, HX OF 05/07/2007  . Human  Immunodeficiency Virus (HIV) Disease 08/04/2006     Health Maintenance Due  Topic Date Due  . Tetanus/tdap  03/07/1968  . Colonoscopy  03/08/1999  . Zostavax  03/07/2009  . Influenza Vaccine  12/17/2013     Review of Systems 10 point ros is negative Physical Exam   BP 126/85  Pulse 82  Temp(Src) 98.1 F (36.7 C) (Oral)  Wt 144 lb (65.318 kg) Physical Exam  Constitutional:  oriented to person, place, and time. appears well-developed and well-nourished. No distress.  HENT:  Mouth/Throat: Oropharynx is clear and moist. No oropharyngeal exudate.  Cardiovascular: Normal rate, regular rhythm and normal heart sounds. Exam reveals no gallop and no friction rub.  No murmur heard.  Pulmonary/Chest: Effort normal and breath sounds normal. No respiratory distress.  has no wheezes.  Lymphadenopathy: no cervical adenopathy.  Neurological: alert and oriented to person, place, and time.  Skin: Skin is warm and dry. No rash noted. No erythema.  Psychiatric: a normal mood and affect. His behavior is normal.   Lab Results  Component Value Date   CD4TCELL 27* 12/29/2013   Lab Results  Component Value Date   CD4TABS 330* 12/29/2013   CD4TABS 370* 09/22/2013   CD4TABS 370* 02/11/2013   Lab Results  Component Value Date   HIV1RNAQUANT <20 12/29/2013   Lab Results  Component Value Date   HEPBSAB NEG 12/29/2013   No results found for this basename: RPR    CBC Lab Results  Component Value Date   WBC 4.4 12/29/2013  RBC 3.84* 12/29/2013   HGB 11.9* 12/29/2013   HCT 34.6* 12/29/2013   PLT 184 12/29/2013   MCV 90.1 12/29/2013   MCH 31.0 12/29/2013   MCHC 34.4 12/29/2013   RDW 14.1 12/29/2013   LYMPHSABS 1.2 12/29/2013   MONOABS 0.3 12/29/2013   EOSABS 0.0 12/29/2013   BASOSABS 0.0 12/29/2013   BMET Lab Results  Component Value Date   NA 139 12/29/2013   K 3.3* 12/29/2013   CL 107 12/29/2013   CO2 26 12/29/2013   GLUCOSE 86 12/29/2013   BUN 10 12/29/2013   CREATININE 0.70 12/29/2013    CALCIUM 9.1 12/29/2013   GFRNONAA >89 12/29/2013   GFRAA >89 12/29/2013     Assessment and Plan  hiv = well controlled, continue on triomeq  htn = well controlled on amlodipine  Health maintenance = will ensure she gets pap in the coming 6 months

## 2014-01-10 ENCOUNTER — Encounter: Payer: Self-pay | Admitting: General Practice

## 2014-01-12 ENCOUNTER — Ambulatory Visit (INDEPENDENT_AMBULATORY_CARE_PROVIDER_SITE_OTHER): Payer: Medicare Other | Admitting: Surgery

## 2014-02-01 ENCOUNTER — Ambulatory Visit (INDEPENDENT_AMBULATORY_CARE_PROVIDER_SITE_OTHER): Payer: Commercial Managed Care - HMO | Admitting: Surgery

## 2014-02-09 ENCOUNTER — Ambulatory Visit: Payer: Medicare Other | Admitting: Internal Medicine

## 2014-02-23 ENCOUNTER — Other Ambulatory Visit: Payer: Self-pay

## 2014-03-01 ENCOUNTER — Ambulatory Visit: Payer: Medicare Other | Admitting: Internal Medicine

## 2014-03-01 ENCOUNTER — Telehealth: Payer: Self-pay | Admitting: Internal Medicine

## 2014-03-01 ENCOUNTER — Other Ambulatory Visit: Payer: Medicare Other

## 2014-03-01 NOTE — Telephone Encounter (Signed)
pt called to cx appt....did not want to r/s °

## 2014-03-09 ENCOUNTER — Telehealth: Payer: Self-pay | Admitting: *Deleted

## 2014-03-09 NOTE — Telephone Encounter (Signed)
Can give ambien 10 mg qhs, can start with 1/2 tab to see if it works. #30, NR

## 2014-03-09 NOTE — Telephone Encounter (Signed)
Requesting refill for Ambien.  # of tablets and # of refills?  Please advise.

## 2014-03-10 ENCOUNTER — Other Ambulatory Visit: Payer: Self-pay | Admitting: *Deleted

## 2014-03-10 DIAGNOSIS — G4700 Insomnia, unspecified: Secondary | ICD-10-CM

## 2014-03-10 MED ORDER — ZOLPIDEM TARTRATE 10 MG PO TABS: 5.0000 mg | ORAL_TABLET | Freq: Every evening | ORAL | Status: DC | PRN

## 2014-03-14 ENCOUNTER — Other Ambulatory Visit: Payer: Self-pay | Admitting: *Deleted

## 2014-03-14 DIAGNOSIS — B20 Human immunodeficiency virus [HIV] disease: Secondary | ICD-10-CM

## 2014-03-14 MED ORDER — ABACAVIR-DOLUTEGRAVIR-LAMIVUD 600-50-300 MG PO TABS: 1.0000 | ORAL_TABLET | Freq: Every day | ORAL | Status: DC

## 2014-03-20 ENCOUNTER — Encounter: Payer: Self-pay | Admitting: Internal Medicine

## 2014-03-26 ENCOUNTER — Emergency Department (HOSPITAL_COMMUNITY): Payer: Medicare HMO

## 2014-03-26 ENCOUNTER — Emergency Department (HOSPITAL_COMMUNITY)
Admission: EM | Admit: 2014-03-26 | Discharge: 2014-03-26 | Disposition: A | Discharge status: Home / Self Care | Payer: Medicare HMO | Attending: Emergency Medicine | Admitting: Emergency Medicine

## 2014-03-26 ENCOUNTER — Encounter (HOSPITAL_COMMUNITY): Payer: Self-pay | Admitting: Emergency Medicine

## 2014-03-26 DIAGNOSIS — K8050 Calculus of bile duct without cholangitis or cholecystitis without obstruction: Secondary | ICD-10-CM

## 2014-03-26 DIAGNOSIS — Z8739 Personal history of other diseases of the musculoskeletal system and connective tissue: Secondary | ICD-10-CM | POA: Diagnosis not present

## 2014-03-26 DIAGNOSIS — K8020 Calculus of gallbladder without cholecystitis without obstruction: Secondary | ICD-10-CM | POA: Insufficient documentation

## 2014-03-26 DIAGNOSIS — Z792 Long term (current) use of antibiotics: Secondary | ICD-10-CM | POA: Diagnosis not present

## 2014-03-26 DIAGNOSIS — Z79899 Other long term (current) drug therapy: Secondary | ICD-10-CM | POA: Diagnosis not present

## 2014-03-26 DIAGNOSIS — R079 Chest pain, unspecified: Secondary | ICD-10-CM | POA: Insufficient documentation

## 2014-03-26 DIAGNOSIS — Z21 Asymptomatic human immunodeficiency virus [HIV] infection status: Secondary | ICD-10-CM | POA: Insufficient documentation

## 2014-03-26 DIAGNOSIS — R1011 Right upper quadrant pain: Secondary | ICD-10-CM

## 2014-03-26 LAB — URINALYSIS, ROUTINE W REFLEX MICROSCOPIC
Bilirubin Urine: NEGATIVE
Glucose, UA: NEGATIVE mg/dL
Hgb urine dipstick: NEGATIVE
Ketones, ur: NEGATIVE mg/dL
Leukocytes, UA: NEGATIVE
Nitrite: NEGATIVE
Protein, ur: NEGATIVE mg/dL
Specific Gravity, Urine: 1.011 (ref 1.005–1.030)
Urobilinogen, UA: 1 mg/dL (ref 0.0–1.0)
pH: 8 (ref 5.0–8.0)

## 2014-03-26 LAB — CBC WITH DIFFERENTIAL/PLATELET
Basophils Absolute: 0 10*3/uL (ref 0.0–0.1)
Basophils Relative: 0 % (ref 0–1)
Eosinophils Absolute: 0 10*3/uL (ref 0.0–0.7)
Eosinophils Relative: 1 % (ref 0–5)
HCT: 42.5 % (ref 36.0–46.0)
Hemoglobin: 14.5 g/dL (ref 12.0–15.0)
Lymphocytes Relative: 37 % (ref 12–46)
Lymphs Abs: 1.8 10*3/uL (ref 0.7–4.0)
MCH: 31.3 pg (ref 26.0–34.0)
MCHC: 34.1 g/dL (ref 30.0–36.0)
MCV: 91.8 fL (ref 78.0–100.0)
Monocytes Absolute: 0.3 10*3/uL (ref 0.1–1.0)
Monocytes Relative: 5 % (ref 3–12)
Neutro Abs: 2.7 10*3/uL (ref 1.7–7.7)
Neutrophils Relative %: 57 % (ref 43–77)
Platelets: 170 10*3/uL (ref 150–400)
RBC: 4.63 MIL/uL (ref 3.87–5.11)
RDW: 12.8 % (ref 11.5–15.5)
WBC: 4.8 10*3/uL (ref 4.0–10.5)

## 2014-03-26 LAB — COMPREHENSIVE METABOLIC PANEL
ALT: 16 U/L (ref 0–35)
AST: 33 U/L (ref 0–37)
Albumin: 4.5 g/dL (ref 3.5–5.2)
Alkaline Phosphatase: 113 U/L (ref 39–117)
Anion gap: 17 — ABNORMAL HIGH (ref 5–15)
BUN: 13 mg/dL (ref 6–23)
CO2: 25 mEq/L (ref 19–32)
Calcium: 10.5 mg/dL (ref 8.4–10.5)
Chloride: 101 mEq/L (ref 96–112)
Creatinine, Ser: 0.77 mg/dL (ref 0.50–1.10)
GFR calc Af Amer: >90 mL/min (ref >90)
GFR calc non Af Amer: 86 mL/min — ABNORMAL LOW (ref >90)
Glucose, Bld: 101 mg/dL — ABNORMAL HIGH (ref 70–99)
Potassium: 3.5 mEq/L — ABNORMAL LOW (ref 3.7–5.3)
Sodium: 143 mEq/L (ref 137–147)
Total Bilirubin: 0.9 mg/dL (ref 0.3–1.2)
Total Protein: 10.5 g/dL — ABNORMAL HIGH (ref 6.0–8.3)

## 2014-03-26 LAB — TROPONIN I: Troponin I: <0.3 ng/mL (ref <0.30)

## 2014-03-26 LAB — LIPASE, BLOOD: Lipase: 48 U/L (ref 11–59)

## 2014-03-26 MED ORDER — ONDANSETRON HCL 4 MG/2ML IJ SOLN
4.0000 mg | Freq: Once | INTRAMUSCULAR | Status: AC
  Administered 2014-03-26: 4 mg via INTRAVENOUS
  Filled 2014-03-26: qty 2

## 2014-03-26 MED ORDER — HYDROMORPHONE HCL 1 MG/ML IJ SOLN
1.0000 mg | Freq: Once | INTRAMUSCULAR | Status: AC
  Administered 2014-03-26: 1 mg via INTRAVENOUS
  Filled 2014-03-26: qty 1

## 2014-03-26 MED ORDER — OXYCODONE-ACETAMINOPHEN 5-325 MG PO TABS: 1.0000 | ORAL_TABLET | ORAL | Status: DC | PRN

## 2014-03-26 MED ORDER — MORPHINE SULFATE 4 MG/ML IJ SOLN
4.0000 mg | Freq: Once | INTRAMUSCULAR | Status: AC
  Administered 2014-03-26: 4 mg via INTRAVENOUS
  Filled 2014-03-26: qty 1

## 2014-03-26 NOTE — ED Notes (Signed)
Patient ambulatory upon d/c in NAD, gait is steady, patient reports feeling comfortable to drive home.

## 2014-03-26 NOTE — ED Notes (Signed)
Pt has had an ultrasound and dx with gallbladder issues she is to see [facility] on tues for possible removal. Pain to upper abd restless, took percocet PTA with no relief

## 2014-03-26 NOTE — ED Notes (Signed)
Pt sts that she has been dz with gallstones, ate sausage last pm and is now in 10/10 pain. Pt is A&O and in NAD

## 2014-03-26 NOTE — ED Provider Notes (Signed)
CSN: [id number]     Arrival date & time 03/26/14  1324 History   First MD Initiated Contact with Patient 03/26/14 1500     Chief Complaint  Patient presents with  . Abdominal Pain  . Nausea     (Consider location/radiation/quality/duration/timing/severity/associated sxs/prior Treatment) HPI Comments: Similar to prior biliary colic. Has appt with surgery to set up elective cholecystectomy.  Patient is a 63 y.o. female presenting with abdominal pain. The history is provided by the patient.  Abdominal Pain Pain location:  RUQ Pain quality: not aching   Pain radiates to:  Does not radiate Pain severity:  Moderate Onset quality:  Sudden Timing:  Constant Chronicity:  Recurrent Context: not previous surgeries and not recent illness   Relieved by:  Nothing Worsened by:  Nothing tried Ineffective treatments: vicodin. Associated symptoms: chest pain   Associated symptoms: no chills, no cough, no fever, no shortness of breath and no vomiting     Past Medical History  Diagnosis Date  . HIV infection   . Osteoporosis   . Gall bladder disease \   Past Surgical History  Procedure Laterality Date  . Ectopic pregnancy surgery    . Shoulder surgery Right    Family History  Problem Relation Age of Onset  . Diabetes Mother   . Hypertension Mother    History  Substance Use Topics  . Smoking status: Never Smoker   . Smokeless tobacco: Never Used  . Alcohol Use: 1.5 oz/week    3 drink(s) per week     Comment: wine or [place] alcohol   OB History    Gravida Para Term Preterm AB TAB SAB Ectopic Multiple Living   5 3 3  0 2 1 0 1 0 2     Review of Systems  Constitutional: Negative for fever and chills.  Respiratory: Negative for cough and shortness of breath.   Cardiovascular: Positive for chest pain.  Gastrointestinal: Negative for vomiting and abdominal pain.  All other systems reviewed and are negative.     Allergies  Dapsone; Retrovir; and  Sulfamethoxazole-trimethoprim  Home Medications   Prior to Admission medications   Medication Sig Start Date End Date Taking? Authorizing Provider  Abacavir-Dolutegravir-Lamivud [id number] MG TABS Take 1 tablet by mouth daily. 03/14/14  Yes Carlyle Basques, MD  amLODipine (NORVASC) 10 MG tablet Take 1 tablet (10 mg total) by mouth daily. 10/11/13  Yes Carlyle Basques, MD  HYDROcodone-acetaminophen (NORCO/VICODIN) 5-325 MG per tablet Take 1 tablet by mouth 2 (two) times daily as needed for moderate pain. 05/11/13  Yes Reyne Dumas, MD  oxyCODONE-acetaminophen (PERCOCET) 5-325 MG per tablet Take 1-2 tablets by mouth every 4 (four) hours as needed. Patient taking differently: Take 1-2 tablets by mouth every 4 (four) hours as needed for moderate pain.  12/06/13  Yes Malvin Johns, MD  zolpidem (AMBIEN) 10 MG tablet Take 0.5 tablets (5 mg total) by mouth at bedtime as needed for sleep. 03/10/14  Yes Carlyle Basques, MD  doxycycline (VIBRA-TABS) 100 MG tablet Take 1 tablet (100 mg total) by mouth 2 (two) times daily. Patient not taking: Reported on 03/26/2014 12/30/13   Carlyle Basques, MD  ondansetron (ZOFRAN ODT) 8 MG disintegrating tablet 8mg  ODT q4 hours prn nausea Patient taking differently: Take 8 mg by mouth every 8 (eight) hours as needed for nausea.  12/06/13   Malvin Johns, MD   BP 154/70 mmHg  Pulse 66  Temp(Src)   Resp 20  SpO2 99% Physical Exam  Constitutional: She is oriented to  person, place, and time. She appears well-developed and well-nourished. No distress.  HENT:  Head: Normocephalic and atraumatic.  Mouth/Throat: Oropharynx is clear and moist.  Eyes: EOM are normal. Pupils are equal, round, and reactive to light.  Neck: Normal range of motion. Neck supple.  Cardiovascular: Normal rate and regular rhythm.  Exam reveals no friction rub.   No murmur heard. Pulmonary/Chest: Effort normal and breath sounds normal. No respiratory distress. She has no wheezes. She has no rales.   Abdominal: Soft. She exhibits no distension. There is tenderness (epigastric, RUQ severe). There is no rebound.  Musculoskeletal: Normal range of motion. She exhibits no edema.  Neurological: She is alert and oriented to person, place, and time. She exhibits normal muscle tone. Coordination normal.  Skin: No rash noted. She is not diaphoretic.  Nursing note and vitals reviewed.   ED Course  Procedures (including critical care time) Labs Review Labs Reviewed  COMPREHENSIVE METABOLIC PANEL - Abnormal; Notable for the following:    Potassium 3.5 (*)    Glucose, Bld 101 (*)    Total Protein 10.5 (*)    GFR calc non Af Amer 86 (*)    Anion gap 17 (*)    All other components within normal limits  URINALYSIS, ROUTINE W REFLEX MICROSCOPIC - Abnormal; Notable for the following:    APPearance CLOUDY (*)    All other components within normal limits  CBC WITH DIFFERENTIAL  LIPASE, BLOOD  TROPONIN I    Imaging Review No results found.   EKG Interpretation   Date/Time:  Sunday March 26 2014 13:57:15 EST Ventricular Rate:  64 PR Interval:  110 QRS Duration: 73 QT Interval:  432 QTC Calculation: 446 R Axis:   43 Text Interpretation:  Atrial-paced complexes Similar to prior Confirmed by  Mingo Amber  MD, Ryenn Howeth ([id number]) on 03/26/2014 3:31:56 PM      MDM   Final diagnoses:  RUQ pain  Biliary colic    63 year old female with history of cholelithiasis presents with right upper quadrant pain. Began after eating today. No relief with Vicodin home. Similar to her prior biliary colic. [place] negative. Labs ok. Feeling much better after labs. Stable for discharge, given pain medications, has surgery f/u this week.   Evelina Bucy, MD 03/26/14 1921

## 2014-03-26 NOTE — Discharge Instructions (Signed)
Biliary Colic  °Biliary colic is a steady or irregular pain in the upper abdomen. It is usually under the right side of the rib cage. It happens when gallstones interfere with the normal flow of bile from the gallbladder. Bile is a liquid that helps to digest fats. Bile is made in the liver and stored in the gallbladder. When you eat a meal, bile passes from the gallbladder through the cystic duct and the common bile duct into the small intestine. There, it mixes with partially digested food. If a gallstone blocks either of these ducts, the normal flow of bile is blocked. The muscle cells in the bile duct contract forcefully to try to move the stone. This causes the pain of biliary colic.  °SYMPTOMS  °· A person with biliary colic usually complains of pain in the upper abdomen. This pain can be: °¨ In the center of the upper abdomen just below the breastbone. °¨ In the upper-right part of the abdomen, near the gallbladder and liver. °¨ Spread back toward the right shoulder blade. °· Nausea and vomiting. °· The pain usually occurs after eating. °· Biliary colic is usually triggered by the digestive system's demand for bile. The demand for bile is high after fatty meals. Symptoms can also occur when a person who has been fasting suddenly eats a very large meal. Most episodes of biliary colic pass after 1 to 5 hours. After the most intense pain passes, your abdomen may continue to ache mildly for about 24 hours. °DIAGNOSIS  °After you describe your symptoms, your caregiver will perform a physical exam. He or she will pay attention to the upper right portion of your belly (abdomen). This is the area of your liver and gallbladder. An ultrasound will help your caregiver look for gallstones. Specialized scans of the gallbladder may also be done. Blood tests may be done, especially if you have fever or if your pain persists. °PREVENTION  °Biliary colic can be prevented by controlling the risk factors for gallstones. Some of  these risk factors, such as heredity, increasing age, and pregnancy are a normal part of life. Obesity and a high-fat diet are risk factors you can change through a healthy lifestyle. Women going through menopause who take hormone replacement therapy (estrogen) are also more likely to develop biliary colic. °TREATMENT  °· Pain medication may be prescribed. °· You may be encouraged to eat a fat-free diet. °· If the first episode of biliary colic is severe, or episodes of colic keep retuning, surgery to remove the gallbladder (cholecystectomy) is usually recommended. This procedure can be done through small incisions using an instrument called a laparoscope. The procedure often requires a brief stay in the hospital. Some people can leave the hospital the same day. It is the most widely used treatment in people troubled by painful gallstones. It is effective and safe, with no complications in more than 90% of cases. °· If surgery cannot be done, medication that dissolves gallstones may be used. This medication is expensive and can take months or years to work. Only small stones will dissolve. °· Rarely, medication to dissolve gallstones is combined with a procedure called shock-wave lithotripsy. This procedure uses carefully aimed shock waves to break up gallstones. In many people treated with this procedure, gallstones form again within a few years. °PROGNOSIS  °If gallstones block your cystic duct or common bile duct, you are at risk for repeated episodes of biliary colic. There is also a 25% chance that you will develop   a gallbladder infection(acute cholecystitis), or some other complication of gallstones within 10 to 20 years. If you have surgery, schedule it at a time that is convenient for you and at a time when you are not sick. °HOME CARE INSTRUCTIONS  °· Drink plenty of clear fluids. °· Avoid fatty, greasy or fried foods, or any foods that make your pain worse. °· Take medications as directed. °SEEK MEDICAL  CARE IF:  °· You develop a fever over 100.5° F (38.1° C). °· Your pain gets worse over time. °· You develop nausea that prevents you from eating and drinking. °· You develop vomiting. °SEEK IMMEDIATE MEDICAL CARE IF:  °· You have continuous or severe belly (abdominal) pain which is not relieved with medications. °· You develop nausea and vomiting which is not relieved with medications. °· You have symptoms of biliary colic and you suddenly develop a fever and shaking chills. This may signal cholecystitis. Call your caregiver immediately. °· You develop a yellow color to your skin or the [place] part of your eyes (jaundice). °Document Released: 10/06/2005 Document Revised: 07/28/2011 Document Reviewed: 12/16/2007 °ExitCare® Patient Information ©2015 [employer]. This information is not intended to replace advice given to you by your health care provider. Make sure you discuss any questions you have with your health care provider. ° °

## 2014-03-26 NOTE — ED Notes (Signed)
Patient is in too much pain to give sample. Patient also stated that she used the restroom before she came.

## 2014-05-09 ENCOUNTER — Telehealth: Payer: Self-pay | Admitting: *Deleted

## 2014-05-09 NOTE — Telephone Encounter (Signed)
Pt requesting refill for ambien.  Last refilled 03/10/14.  MD please advise about number of tablets and refills.

## 2014-05-15 ENCOUNTER — Telehealth: Payer: Self-pay | Admitting: *Deleted

## 2014-05-15 DIAGNOSIS — G4700 Insomnia, unspecified: Secondary | ICD-10-CM

## 2014-05-15 MED ORDER — ZOLPIDEM TARTRATE 10 MG PO TABS: 5.0000 mg | ORAL_TABLET | Freq: Every evening | ORAL | Status: DC | PRN
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
## 2014-06-12 ENCOUNTER — Other Ambulatory Visit: Payer: Medicare HMO

## 2014-06-12 DIAGNOSIS — B20 Human immunodeficiency virus [HIV] disease: Secondary | ICD-10-CM

## 2014-06-12 LAB — COMPLETE METABOLIC PANEL WITH GFR
ALT: 11 U/L (ref 0–35)
AST: 22 U/L (ref 0–37)
Albumin: 3.8 g/dL (ref 3.5–5.2)
Alkaline Phosphatase: 89 U/L (ref 39–117)
BUN: 15 mg/dL (ref 6–23)
CO2: 26 mEq/L (ref 19–32)
Calcium: 9.7 mg/dL (ref 8.4–10.5)
Chloride: 105 mEq/L (ref 96–112)
Creat: 0.86 mg/dL (ref 0.50–1.10)
GFR, Est African American: 82 mL/min
GFR, Est Non African American: 71 mL/min
Glucose, Bld: 78 mg/dL (ref 70–99)
Potassium: 3.8 mEq/L (ref 3.5–5.3)
Sodium: 142 mEq/L (ref 135–145)
Total Bilirubin: 0.5 mg/dL (ref 0.2–1.2)
Total Protein: 8 g/dL (ref 6.0–8.3)

## 2014-06-12 LAB — CBC WITH DIFFERENTIAL/PLATELET
Basophils Absolute: 0 10*3/uL (ref 0.0–0.1)
Basophils Relative: 0 % (ref 0–1)
Eosinophils Absolute: 0.1 10*3/uL (ref 0.0–0.7)
Eosinophils Relative: 3 % (ref 0–5)
HCT: 38.8 % (ref 36.0–46.0)
Hemoglobin: 12.6 g/dL (ref 12.0–15.0)
Lymphocytes Relative: 20 % (ref 12–46)
Lymphs Abs: 1 10*3/uL (ref 0.7–4.0)
MCH: 29.5 pg (ref 26.0–34.0)
MCHC: 32.5 g/dL (ref 30.0–36.0)
MCV: 90.9 fL (ref 78.0–100.0)
MPV: 11.4 fL (ref 8.6–12.4)
Monocytes Absolute: 0.3 10*3/uL (ref 0.1–1.0)
Monocytes Relative: 7 % (ref 3–12)
Neutro Abs: 3.4 10*3/uL (ref 1.7–7.7)
Neutrophils Relative %: 70 % (ref 43–77)
Platelets: 198 10*3/uL (ref 150–400)
RBC: 4.27 MIL/uL (ref 3.87–5.11)
RDW: 13.8 % (ref 11.5–15.5)
WBC: 4.9 10*3/uL (ref 4.0–10.5)

## 2014-06-13 LAB — T-HELPER CELL (CD4) - (RCID CLINIC ONLY)
CD4 % Helper T Cell: 25 % — ABNORMAL LOW (ref 33–55)
CD4 T Cell Abs: 240 /uL — ABNORMAL LOW (ref 400–2700)

## 2014-06-13 LAB — HIV-1 RNA QUANT-NO REFLEX-BLD
HIV 1 RNA Quant: <20 copies/mL (ref <20)
HIV-1 RNA Quant, Log: <1.3 {Log} (ref <1.30)

## 2014-06-15 ENCOUNTER — Other Ambulatory Visit: Payer: Commercial Managed Care - HMO

## 2014-06-26 ENCOUNTER — Telehealth: Payer: Self-pay | Admitting: *Deleted

## 2014-06-26 ENCOUNTER — Other Ambulatory Visit: Payer: Self-pay | Admitting: *Deleted

## 2014-06-26 DIAGNOSIS — G4700 Insomnia, unspecified: Secondary | ICD-10-CM

## 2014-06-26 MED ORDER — ZOLPIDEM TARTRATE 10 MG PO TABS: 5.0000 mg | ORAL_TABLET | Freq: Every evening | ORAL | Status: DC | PRN

## 2014-06-26 NOTE — Telephone Encounter (Signed)
Patient called for a refill of her Ambien is this okay to refill?

## 2014-06-26 NOTE — Telephone Encounter (Signed)
Rx called to pharmacy and patient notified.

## 2014-06-26 NOTE — Telephone Encounter (Signed)
Yes can refill ambien with 2 addn refills

## 2014-07-13 ENCOUNTER — Ambulatory Visit: Payer: Commercial Managed Care - HMO | Admitting: Internal Medicine

## 2014-07-18 ENCOUNTER — Other Ambulatory Visit: Payer: Self-pay | Admitting: *Deleted

## 2014-07-18 DIAGNOSIS — Z113 Encounter for screening for infections with a predominantly sexual mode of transmission: Secondary | ICD-10-CM

## 2014-07-18 DIAGNOSIS — B20 Human immunodeficiency virus [HIV] disease: Secondary | ICD-10-CM

## 2014-07-21 ENCOUNTER — Telehealth: Payer: Self-pay | Admitting: *Deleted

## 2014-07-21 NOTE — Telephone Encounter (Signed)
Patient called asking when Dr. Baxter Flattery would next be in the office. Told her 07/27/14 and she said ok she would be in that day to talk to her. Explained that the Dr. Lynnda Child be busy in clinic and would not be able to speak to her without an appointment. Tried to find out what this was about and she said it was a Personnel officer. She then said she needed a letter from the MD but would not say what it was about. Offered her the next available appt on 08/15/14; but she said it can not wait that long. Advised I would send a note to MD asking her to call if she is able. [phone].

## 2014-07-22 ENCOUNTER — Emergency Department (HOSPITAL_COMMUNITY)
Admission: EM | Admit: 2014-07-22 | Discharge: 2014-07-22 | Disposition: A | Discharge status: Home / Self Care | Payer: Medicare HMO | Attending: Emergency Medicine | Admitting: Emergency Medicine

## 2014-07-22 ENCOUNTER — Encounter (HOSPITAL_COMMUNITY): Payer: Self-pay | Admitting: Emergency Medicine

## 2014-07-22 ENCOUNTER — Emergency Department (HOSPITAL_COMMUNITY): Payer: Medicare HMO

## 2014-07-22 DIAGNOSIS — K8050 Calculus of bile duct without cholangitis or cholecystitis without obstruction: Secondary | ICD-10-CM | POA: Insufficient documentation

## 2014-07-22 DIAGNOSIS — Z21 Asymptomatic human immunodeficiency virus [HIV] infection status: Secondary | ICD-10-CM | POA: Insufficient documentation

## 2014-07-22 DIAGNOSIS — Z79899 Other long term (current) drug therapy: Secondary | ICD-10-CM | POA: Diagnosis not present

## 2014-07-22 DIAGNOSIS — Z8739 Personal history of other diseases of the musculoskeletal system and connective tissue: Secondary | ICD-10-CM | POA: Insufficient documentation

## 2014-07-22 DIAGNOSIS — I10 Essential (primary) hypertension: Secondary | ICD-10-CM | POA: Diagnosis not present

## 2014-07-22 DIAGNOSIS — K8020 Calculus of gallbladder without cholecystitis without obstruction: Secondary | ICD-10-CM | POA: Diagnosis not present

## 2014-07-22 DIAGNOSIS — R1013 Epigastric pain: Secondary | ICD-10-CM | POA: Diagnosis present

## 2014-07-22 HISTORY — DX: Essential (primary) hypertension: I10

## 2014-07-22 LAB — COMPREHENSIVE METABOLIC PANEL
ALT: 16 U/L (ref 0–35)
AST: 35 U/L (ref 0–37)
Albumin: 3.7 g/dL (ref 3.5–5.2)
Alkaline Phosphatase: 91 U/L (ref 39–117)
Anion gap: 9 (ref 5–15)
BUN: 12 mg/dL (ref 6–23)
CO2: 27 mmol/L (ref 19–32)
Calcium: 9.3 mg/dL (ref 8.4–10.5)
Chloride: 106 mmol/L (ref 96–112)
Creatinine, Ser: 0.86 mg/dL (ref 0.50–1.10)
GFR calc Af Amer: 80 mL/min — ABNORMAL LOW (ref >90)
GFR calc non Af Amer: 69 mL/min — ABNORMAL LOW (ref >90)
Glucose, Bld: 105 mg/dL — ABNORMAL HIGH (ref 70–99)
Potassium: 3.6 mmol/L (ref 3.5–5.1)
Sodium: 142 mmol/L (ref 135–145)
Total Bilirubin: 1 mg/dL (ref 0.3–1.2)
Total Protein: 8.6 g/dL — ABNORMAL HIGH (ref 6.0–8.3)

## 2014-07-22 LAB — CBC WITH DIFFERENTIAL/PLATELET
Basophils Absolute: 0 10*3/uL (ref 0.0–0.1)
Basophils Relative: 0 % (ref 0–1)
Eosinophils Absolute: 0.1 10*3/uL (ref 0.0–0.7)
Eosinophils Relative: 1 % (ref 0–5)
HCT: 39.9 % (ref 36.0–46.0)
Hemoglobin: 13 g/dL (ref 12.0–15.0)
Lymphocytes Relative: 15 % (ref 12–46)
Lymphs Abs: 1 10*3/uL (ref 0.7–4.0)
MCH: 30 pg (ref 26.0–34.0)
MCHC: 32.6 g/dL (ref 30.0–36.0)
MCV: 91.9 fL (ref 78.0–100.0)
Monocytes Absolute: 0.3 10*3/uL (ref 0.1–1.0)
Monocytes Relative: 5 % (ref 3–12)
Neutro Abs: 5.4 10*3/uL (ref 1.7–7.7)
Neutrophils Relative %: 79 % — ABNORMAL HIGH (ref 43–77)
Platelets: 187 10*3/uL (ref 150–400)
RBC: 4.34 MIL/uL (ref 3.87–5.11)
RDW: 13.7 % (ref 11.5–15.5)
WBC: 6.8 10*3/uL (ref 4.0–10.5)

## 2014-07-22 LAB — LIPASE, BLOOD: Lipase: 27 U/L (ref 11–59)

## 2014-07-22 MED ORDER — HYDROMORPHONE HCL 1 MG/ML IJ SOLN
1.0000 mg | Freq: Once | INTRAMUSCULAR | Status: AC
Start: 2014-07-22 — End: 2014-07-22
  Administered 2014-07-22: 1 mg via INTRAMUSCULAR
  Filled 2014-07-22: qty 1

## 2014-07-22 MED ORDER — KETOROLAC TROMETHAMINE 15 MG/ML IJ SOLN
15.0000 mg | Freq: Once | INTRAMUSCULAR | Status: AC
  Administered 2014-07-22: 15 mg via INTRAVENOUS
  Filled 2014-07-22: qty 1

## 2014-07-22 MED ORDER — HYDROMORPHONE HCL 1 MG/ML IJ SOLN
1.0000 mg | Freq: Once | INTRAMUSCULAR | Status: AC
  Administered 2014-07-22: 1 mg via INTRAVENOUS
  Filled 2014-07-22: qty 1

## 2014-07-22 MED ORDER — ONDANSETRON HCL 4 MG PO TABS: 4.0000 mg | ORAL_TABLET | Freq: Four times a day (QID) | ORAL | Status: DC

## 2014-07-22 MED ORDER — OXYCODONE-ACETAMINOPHEN 5-325 MG PO TABS: 1.0000 | ORAL_TABLET | ORAL | Status: DC | PRN

## 2014-07-22 MED ORDER — ONDANSETRON 4 MG PO TBDP
4.0000 mg | ORAL_TABLET | Freq: Once | ORAL | Status: AC
  Administered 2014-07-22: 4 mg via ORAL
  Filled 2014-07-22: qty 1

## 2014-07-22 MED ORDER — ONDANSETRON HCL 4 MG/2ML IJ SOLN
4.0000 mg | Freq: Once | INTRAMUSCULAR | Status: AC
  Administered 2014-07-22: 4 mg via INTRAVENOUS
  Filled 2014-07-22: qty 2

## 2014-07-22 NOTE — ED Notes (Signed)
MD at bedside. 

## 2014-07-22 NOTE — ED Notes (Signed)
Pt alert,oriented, and ambulatory upon DC. 

## 2014-07-22 NOTE — ED Provider Notes (Signed)
CSN: [id number]     Arrival date & time 07/22/14  1316 History   First MD Initiated Contact with Patient 07/22/14 1357     Chief Complaint  Patient presents with  . Abdominal Pain  . Nausea     (Consider location/radiation/quality/duration/timing/severity/associated sxs/prior Treatment) HPI   64 year old female with abdominal pain. Pain is in epigastrium and right upper quadrant. Worsening earlier today. Says often times foods with a lot of butter will seem to precipitate these symptoms, but has not had them this severe in awhile. Patient has a past history of cholelithiasis. States current symptoms feel similar to previous bouts of biliary colic. She's been previously referred to general surgery but has yet to make an appointment. Nausea, but no vomiting. No fevers or chills. No urinary complaints. No diarrhea. Has not tried taking anything for current symptoms.   Past Medical History  Diagnosis Date  . HIV infection   . Osteoporosis   . Gall bladder disease \  . Hypertension    Past Surgical History  Procedure Laterality Date  . Ectopic pregnancy surgery    . Shoulder surgery Right    Family History  Problem Relation Age of Onset  . Diabetes Mother   . Hypertension Mother    History  Substance Use Topics  . Smoking status: Never Smoker   . Smokeless tobacco: Never Used  . Alcohol Use: 1.5 oz/week    3 Standard drinks or equivalent per week     Comment: wine or [place] alcohol   OB History    Gravida Para Term Preterm AB TAB SAB Ectopic Multiple Living   5 3 3  0 2 1 0 1 0 2     Review of Systems  All systems reviewed and negative, other than as noted in HPI.   Allergies  Dapsone; Retrovir; and Sulfamethoxazole-trimethoprim  Home Medications   Prior to Admission medications   Medication Sig Start Date End Date Taking? Authorizing Provider  Abacavir-Dolutegravir-Lamivud [phone] MG TABS Take 1 tablet by mouth daily. 03/14/14  Yes Carlyle Basques, MD  amLODipine  (NORVASC) 10 MG tablet Take 1 tablet (10 mg total) by mouth daily. 10/11/13  Yes Carlyle Basques, MD  HYDROcodone-acetaminophen (NORCO/VICODIN) 5-325 MG per tablet Take 1 tablet by mouth 2 (two) times daily as needed for moderate pain. 05/11/13  Yes Reyne Dumas, MD  ondansetron (ZOFRAN ODT) 8 MG disintegrating tablet 8mg  ODT q4 hours prn nausea Patient taking differently: Take 8 mg by mouth every 8 (eight) hours as needed for nausea.  12/06/13  Yes Malvin Johns, MD  oxyCODONE-acetaminophen (PERCOCET) 5-325 MG per tablet Take 1-2 tablets by mouth every 4 (four) hours as needed for moderate pain. 03/26/14  Yes Evelina Bucy, MD  zolpidem (AMBIEN) 10 MG tablet Take 0.5 tablets (5 mg total) by mouth at bedtime as needed for sleep. 06/26/14  Yes Carlyle Basques, MD  doxycycline (VIBRA-TABS) 100 MG tablet Take 1 tablet (100 mg total) by mouth 2 (two) times daily. Patient not taking: Reported on 03/26/2014 12/30/13   Carlyle Basques, MD   BP 144/93 mmHg  Pulse 73  Temp(Src) 97.8 F (36.6 C) (Oral)  Resp 22  SpO2 99% Physical Exam  Constitutional: She appears well-developed and well-nourished. No distress.  Laying in bed. Appears uncomfortable.   HENT:  Head: Normocephalic and atraumatic.  Eyes: Conjunctivae are normal. Right eye exhibits no discharge. Left eye exhibits no discharge.  Neck: Neck supple.  Cardiovascular: Normal rate, regular rhythm and normal heart sounds.  Exam reveals no  gallop and no friction rub.   No murmur heard. Pulmonary/Chest: Effort normal and breath sounds normal. No respiratory distress.  Abdominal: Soft. She exhibits no distension. There is tenderness. There is no rebound and no guarding.  Epigastric and RUQ tenderness  Musculoskeletal: She exhibits no edema or tenderness.  Neurological: She is alert.  Skin: Skin is warm and dry.  Psychiatric: She has a normal mood and affect. Her behavior is normal. Thought content normal.  Nursing note and vitals reviewed.   ED Course   Procedures (including critical care time) Labs Review Labs Reviewed  CBC WITH DIFFERENTIAL/PLATELET - Abnormal; Notable for the following:    Neutrophils Relative % 79 (*)    All other components within normal limits  COMPREHENSIVE METABOLIC PANEL - Abnormal; Notable for the following:    Glucose, Bld 105 (*)    Total Protein 8.6 (*)    GFR calc non Af Amer 69 (*)    GFR calc Af Amer 80 (*)    All other components within normal limits  LIPASE, BLOOD    Imaging Review US Abdomen Limited  07/22/2014   CLINICAL DATA:  Abdominal pain. Initial encounter. Nausea. Onset of symptoms today.  EXAM: US ABDOMEN LIMITED - RIGHT UPPER QUADRANT  COMPARISON:  12/05/2013.  FINDINGS: Gallbladder:  Multiple small gallstones are present with the largest 8 mm. No sonographic Murphy sign. No wall thickening.  Common bile duct:  Diameter: 9 mm. Previous ultrasound measured the common bile duct did 8 mm. No common duct stone is identified.  Liver:  No focal mass lesion. Parenchymal echotexture is within normal limits. Suggestion of mild intrahepatic biliary ductal dilation. Correlation with bilirubin recommended.  IMPRESSION: 1. Chronic cholelithiasis. No sonographic findings of acute cholecystitis. 2. Chronic enlargement of the common bile duct. Probable intrahepatic biliary ductal dilation. Correlation with bilirubin is recommended. As noted on the prior examination, if further evaluation is warranted, MRCP could be obtained. Non-emergent MRI should be deferred until patient has been discharged for the acute illness, and can optimally cooperate with positioning and breath-holding instructions.   Electronically Signed   By: Dereck Ligas M.D.   On: 07/22/2014 16:13     EKG Interpretation None      MDM   Final diagnoses:  Biliary colic    64 year old female with abdominal pain. Suspect biliary colic. Known cholelithiasis. Symptoms are consistent with it. Afebrile. No leukocytosis. LFTs and lipase are  normal. Will obtain ultrasound. Assuming no signs of cholecystitis or other emergent pathology, I feel pt can follow-up with general surgery as previously recommended.     Virgel Manifold, MD 07/23/14 [phone]

## 2014-07-22 NOTE — Discharge Instructions (Signed)
Biliary Colic  °Biliary colic is a steady or irregular pain in the upper abdomen. It is usually under the right side of the rib cage. It happens when gallstones interfere with the normal flow of bile from the gallbladder. Bile is a liquid that helps to digest fats. Bile is made in the liver and stored in the gallbladder. When you eat a meal, bile passes from the gallbladder through the cystic duct and the common bile duct into the small intestine. There, it mixes with partially digested food. If a gallstone blocks either of these ducts, the normal flow of bile is blocked. The muscle cells in the bile duct contract forcefully to try to move the stone. This causes the pain of biliary colic.  °SYMPTOMS  °· A person with biliary colic usually complains of pain in the upper abdomen. This pain can be: °¨ In the center of the upper abdomen just below the breastbone. °¨ In the upper-right part of the abdomen, near the gallbladder and liver. °¨ Spread back toward the right shoulder blade. °· Nausea and vomiting. °· The pain usually occurs after eating. °· Biliary colic is usually triggered by the digestive system's demand for bile. The demand for bile is high after fatty meals. Symptoms can also occur when a person who has been fasting suddenly eats a very large meal. Most episodes of biliary colic pass after 1 to 5 hours. After the most intense pain passes, your abdomen may continue to ache mildly for about 24 hours. °DIAGNOSIS  °After you describe your symptoms, your caregiver will perform a physical exam. He or she will pay attention to the upper right portion of your belly (abdomen). This is the area of your liver and gallbladder. An ultrasound will help your caregiver look for gallstones. Specialized scans of the gallbladder may also be done. Blood tests may be done, especially if you have fever or if your pain persists. °PREVENTION  °Biliary colic can be prevented by controlling the risk factors for gallstones. Some of  these risk factors, such as heredity, increasing age, and pregnancy are a normal part of life. Obesity and a high-fat diet are risk factors you can change through a healthy lifestyle. Women going through menopause who take hormone replacement therapy (estrogen) are also more likely to develop biliary colic. °TREATMENT  °· Pain medication may be prescribed. °· You may be encouraged to eat a fat-free diet. °· If the first episode of biliary colic is severe, or episodes of colic keep retuning, surgery to remove the gallbladder (cholecystectomy) is usually recommended. This procedure can be done through small incisions using an instrument called a laparoscope. The procedure often requires a brief stay in the hospital. Some people can leave the hospital the same day. It is the most widely used treatment in people troubled by painful gallstones. It is effective and safe, with no complications in more than 90% of cases. °· If surgery cannot be done, medication that dissolves gallstones may be used. This medication is expensive and can take months or years to work. Only small stones will dissolve. °· Rarely, medication to dissolve gallstones is combined with a procedure called shock-wave lithotripsy. This procedure uses carefully aimed shock waves to break up gallstones. In many people treated with this procedure, gallstones form again within a few years. °PROGNOSIS  °If gallstones block your cystic duct or common bile duct, you are at risk for repeated episodes of biliary colic. There is also a 25% chance that you will develop   a gallbladder infection(acute cholecystitis), or some other complication of gallstones within 10 to 20 years. If you have surgery, schedule it at a time that is convenient for you and at a time when you are not sick. °HOME CARE INSTRUCTIONS  °· Drink plenty of clear fluids. °· Avoid fatty, greasy or fried foods, or any foods that make your pain worse. °· Take medications as directed. °SEEK MEDICAL  CARE IF:  °· You develop a fever over 100.5° F (38.1° C). °· Your pain gets worse over time. °· You develop nausea that prevents you from eating and drinking. °· You develop vomiting. °SEEK IMMEDIATE MEDICAL CARE IF:  °· You have continuous or severe belly (abdominal) pain which is not relieved with medications. °· You develop nausea and vomiting which is not relieved with medications. °· You have symptoms of biliary colic and you suddenly develop a fever and shaking chills. This may signal cholecystitis. Call your caregiver immediately. °· You develop a yellow color to your skin or the [place] part of your eyes (jaundice). °Document Released: 10/06/2005 Document Revised: 07/28/2011 Document Reviewed: 12/16/2007 °ExitCare® Patient Information ©2015 [employer]. This information is not intended to replace advice given to you by your health care provider. Make sure you discuss any questions you have with your health care provider. ° °

## 2014-07-22 NOTE — ED Notes (Signed)
Pt called twice with no response

## 2014-07-22 NOTE — ED Notes (Signed)
Pt c/o abd pain due to her gallbladder.  Pt is pacing all over the room.  Pt states that she has nausea but denies vomiting. Pt states that she had to cancel her last Dr appt with Dr Lucia Gaskins and doesn't know when she will see him again.

## 2014-07-27 ENCOUNTER — Telehealth: Payer: Self-pay | Admitting: *Deleted

## 2014-07-27 NOTE — Telephone Encounter (Signed)
Question about paperwork she dropped off for Dr. Baxter Flattery.

## 2014-07-28 ENCOUNTER — Telehealth: Payer: Self-pay | Admitting: Internal Medicine

## 2014-07-28 ENCOUNTER — Other Ambulatory Visit: Payer: Self-pay

## 2014-07-28 DIAGNOSIS — Z0000 Encounter for general adult medical examination without abnormal findings: Secondary | ICD-10-CM

## 2014-07-28 NOTE — Telephone Encounter (Signed)
Dr. Lucia Gaskins (gastroenterology) office has called to obtain a referral from [place]. Please f/u with pt.

## 2014-07-31 NOTE — Telephone Encounter (Signed)
Patient advised paperwork is inhouse and will call her once the doctor has completed them. Also advised her that it takes 7-10 days like we discussed when she dropped them off 07/26/14. She has an appt with disability set for 08/01/14.

## 2014-08-07 NOTE — Telephone Encounter (Signed)
Patient called for an update regarding her paperwork and letter.

## 2014-08-08 ENCOUNTER — Encounter: Payer: Self-pay | Admitting: Internal Medicine

## 2014-08-08 ENCOUNTER — Other Ambulatory Visit: Payer: Self-pay | Admitting: Internal Medicine

## 2014-08-08 DIAGNOSIS — B182 Chronic viral hepatitis C: Secondary | ICD-10-CM

## 2014-08-09 ENCOUNTER — Encounter: Payer: Self-pay | Admitting: Pharmacist Clinician (PhC)/ Clinical Pharmacy Specialist

## 2014-08-09 ENCOUNTER — Other Ambulatory Visit: Payer: Self-pay | Admitting: Pharmacist Clinician (PhC)/ Clinical Pharmacy Specialist

## 2014-08-09 MED ORDER — LEDIPASVIR-SOFOSBUVIR 90-400 MG PO TABS: 1.0000 | ORAL_TABLET | Freq: Every day | ORAL | Status: DC

## 2014-08-09 NOTE — Progress Notes (Signed)
Patient ID: LUDENE STOKKE, female   DOB: 1951-05-15, 64 y.o.   MRN: [id number] HPI: DESTANI WAMSER is a 64 y.o. female who is co-infected with HIV/Hep C.   Lab Results  Component Value Date   HCVGENOTYPE 1b 12/29/2013    Allergies: Allergies  Allergen Reactions  . Dapsone Other (See Comments)    stomach  burning  . Retrovir [Zidovudine] Other (See Comments)    Changed skin color  . Sulfamethoxazole-Trimethoprim Other (See Comments)    Flu like symptoms    Vitals:    Past Medical History: Past Medical History  Diagnosis Date  . HIV infection   . Osteoporosis   . Gall bladder disease \  . Hypertension     Social History: History   Social History  . Marital Status: Divorced    Spouse Name: N/A  . Number of Children: N/A  . Years of Education: N/A   Social History Main Topics  . Smoking status: Never Smoker   . Smokeless tobacco: Never Used  . Alcohol Use: 1.5 oz/week    3 Standard drinks or equivalent per week     Comment: wine or [place] alcohol  . Drug Use: No  . Sexual Activity: No     Comment: declined condoms   Other Topics Concern  . Not on file   Social History Narrative    Labs: HIV 1 RNA QUANT (copies/mL)  Date Value  06/12/2014 <20  12/29/2013 <20  09/22/2013 <20   CD4 T CELL ABS (/uL)  Date Value  06/12/2014 240*  12/29/2013 330*  09/22/2013 370*   HEP B S AB (no units)  Date Value  12/29/2013 NEG   HEPATITIS B SURFACE AG (no units)  Date Value  10/29/2007 NEG   HCV AB (no units)  Date Value  10/29/2007 REACTIVE*    Lab Results  Component Value Date   HCVGENOTYPE 1b 12/29/2013    Hepatitis C RNA quantitative Latest Ref Rng 12/29/2013  HCV Quantitative <15 IU/mL [id number](H)  HCV Quantitative Log <1.18 log 10 6.53(H)    AST (U/L)  Date Value  07/22/2014 35  06/12/2014 22  03/26/2014 33  08/31/2013 25  03/01/2013 36*  07/07/2012 29   ALT (U/L)  Date Value  07/22/2014 16  06/12/2014 11  03/26/2014 16   08/31/2013 9  03/01/2013 21  07/07/2012 14   INR (no units)  Date Value  11/18/2012 1.09    CrCl: CrCl cannot be calculated (Unknown ideal weight.).  Fibrosis Score:  APRI = 0.51  Child-Pugh Score: Class A  Previous Treatment Regimen: Naive  Assessment: 64 yo who is co-infected with HIV/Hep C. Dr Baxter Flattery was inquiring about treating her hep C. She has genotype 1b. She doesn't have elastography so the APRI score was used for her case. She is currently on Triumeq which is ok to be on Harvoni. Planning to treat her for 3 months. I called her today to told her about the meds and explained the potential side effects. Told her to avoid H2 and PPIs if possible. She is not taking any right now.   Recommendations:  Harvoni 1 PO qday Get lab in 1 month  Onnie Boer [place], [place].D., BCPS, AAHIVP [facility] 08/09/2014, 2:46 PM

## 2014-08-10 ENCOUNTER — Other Ambulatory Visit: Payer: Commercial Managed Care - HMO

## 2014-08-10 DIAGNOSIS — B20 Human immunodeficiency virus [HIV] disease: Secondary | ICD-10-CM

## 2014-08-10 DIAGNOSIS — B182 Chronic viral hepatitis C: Secondary | ICD-10-CM | POA: Diagnosis not present

## 2014-08-10 LAB — CBC WITH DIFFERENTIAL/PLATELET
Basophils Absolute: 0 10*3/uL (ref 0.0–0.1)
Basophils Relative: 1 % (ref 0–1)
Eosinophils Absolute: 0.1 10*3/uL (ref 0.0–0.7)
Eosinophils Relative: 2 % (ref 0–5)
HCT: 37.6 % (ref 36.0–46.0)
Hemoglobin: 12.7 g/dL (ref 12.0–15.0)
Lymphocytes Relative: 23 % (ref 12–46)
Lymphs Abs: 1.1 10*3/uL (ref 0.7–4.0)
MCH: 29.5 pg (ref 26.0–34.0)
MCHC: 33.8 g/dL (ref 30.0–36.0)
MCV: 87.4 fL (ref 78.0–100.0)
MPV: 10.9 fL (ref 8.6–12.4)
Monocytes Absolute: 0.3 10*3/uL (ref 0.1–1.0)
Monocytes Relative: 6 % (ref 3–12)
Neutro Abs: 3.2 10*3/uL (ref 1.7–7.7)
Neutrophils Relative %: 68 % (ref 43–77)
Platelets: 215 10*3/uL (ref 150–400)
RBC: 4.3 MIL/uL (ref 3.87–5.11)
RDW: 14.3 % (ref 11.5–15.5)
WBC: 4.7 10*3/uL (ref 4.0–10.5)

## 2014-08-10 LAB — COMPLETE METABOLIC PANEL WITH GFR
ALT: 13 U/L (ref 0–35)
AST: 27 U/L (ref 0–37)
Albumin: 3.8 g/dL (ref 3.5–5.2)
Alkaline Phosphatase: 97 U/L (ref 39–117)
BUN: 15 mg/dL (ref 6–23)
CO2: 25 mEq/L (ref 19–32)
Calcium: 9.3 mg/dL (ref 8.4–10.5)
Chloride: 102 mEq/L (ref 96–112)
Creat: 0.72 mg/dL (ref 0.50–1.10)
GFR, Est African American: >89 mL/min
GFR, Est Non African American: 88 mL/min
Glucose, Bld: 78 mg/dL (ref 70–99)
Potassium: 3.7 mEq/L (ref 3.5–5.3)
Sodium: 138 mEq/L (ref 135–145)
Total Bilirubin: 0.5 mg/dL (ref 0.2–1.2)
Total Protein: 7.8 g/dL (ref 6.0–8.3)

## 2014-08-10 NOTE — Telephone Encounter (Signed)
Letter done and patient picked up 08/08/14 from Dr Baxter Flattery.

## 2014-08-11 LAB — T-HELPER CELL (CD4) - (RCID CLINIC ONLY)
CD4 % Helper T Cell: 27 % — ABNORMAL LOW (ref 33–55)
CD4 T Cell Abs: 270 /uL — ABNORMAL LOW (ref 400–2700)

## 2014-08-12 LAB — HEPATITIS C RNA QUANTITATIVE
HCV Quantitative Log: 6.74 {Log} — ABNORMAL HIGH (ref <1.18)
HCV Quantitative: 5460568 IU/mL — ABNORMAL HIGH (ref <15)

## 2014-08-12 LAB — HIV-1 RNA QUANT-NO REFLEX-BLD
HIV 1 RNA Quant: <20 copies/mL (ref <20)
HIV-1 RNA Quant, Log: <1.3 {Log} (ref <1.30)

## 2014-08-17 ENCOUNTER — Other Ambulatory Visit: Payer: Self-pay | Admitting: Internal Medicine

## 2014-08-17 DIAGNOSIS — B182 Chronic viral hepatitis C: Secondary | ICD-10-CM

## 2014-08-21 ENCOUNTER — Ambulatory Visit: Payer: Commercial Managed Care - HMO | Attending: Internal Medicine | Admitting: Internal Medicine

## 2014-08-21 ENCOUNTER — Encounter: Payer: Self-pay | Admitting: Internal Medicine

## 2014-08-21 VITALS — BP 140/80 | HR 83 | Temp 98.0°F | Resp 16 | Wt 144.0 lb

## 2014-08-21 DIAGNOSIS — I10 Essential (primary) hypertension: Secondary | ICD-10-CM | POA: Insufficient documentation

## 2014-08-21 DIAGNOSIS — K8020 Calculus of gallbladder without cholecystitis without obstruction: Secondary | ICD-10-CM | POA: Insufficient documentation

## 2014-08-21 DIAGNOSIS — Z21 Asymptomatic human immunodeficiency virus [HIV] infection status: Secondary | ICD-10-CM | POA: Insufficient documentation

## 2014-08-21 NOTE — Progress Notes (Signed)
MRN: [id number] Name: Vanessa Beard  Sex: female Age: 64 y.o. DOB: 22-Aug-1950  Allergies: Dapsone; Retrovir; and Sulfamethoxazole-trimethoprim  Chief Complaint  Patient presents with  . Referral    HPI: Patient is 64 y.o. female who has is she of HIV, hypertension, recently went to the emergency with the symptoms of epigastric pain, EMR reviewed patient had ultrasound done found to have gallstones, she was advised to follow with the surgery, today she is requesting referral for surgeon, today her blood pressure is borderline elevated denies any headache dizziness chest and shortness of breath, denies any nausea vomiting change in bowel habits, as per patient she has not taken her blood pressure medication today.  Past Medical History  Diagnosis Date  . HIV infection   . Osteoporosis   . Gall bladder disease \  . Hypertension     Past Surgical History  Procedure Laterality Date  . Ectopic pregnancy surgery    . Shoulder surgery Right       Medication List       This list is accurate as of: 08/21/14  4:35 PM.  Always use your most recent med list.               Abacavir-Dolutegravir-Lamivud 600-50-300 MG Tabs  Take 1 tablet by mouth daily.     amLODipine 10 MG tablet  Commonly known as:  NORVASC  Take 1 tablet (10 mg total) by mouth daily.     doxycycline 100 MG tablet  Commonly known as:  VIBRA-TABS  Take 1 tablet (100 mg total) by mouth 2 (two) times daily.     HYDROcodone-acetaminophen 5-325 MG per tablet  Commonly known as:  NORCO/VICODIN  Take 1 tablet by mouth 2 (two) times daily as needed for moderate pain.     Ledipasvir-Sofosbuvir 90-400 MG Tabs  Commonly known as:  HARVONI  Take 1 tablet by mouth daily.     ondansetron 4 MG tablet  Commonly known as:  ZOFRAN  Take 1 tablet (4 mg total) by mouth every 6 (six) hours.     ondansetron 8 MG disintegrating tablet  Commonly known as:  ZOFRAN ODT  8mg  ODT q4 hours prn nausea     oxyCODONE-acetaminophen 5-325 MG per tablet  Commonly known as:  PERCOCET  Take 1-2 tablets by mouth every 4 (four) hours as needed for moderate pain.     oxyCODONE-acetaminophen 5-325 MG per tablet  Commonly known as:  PERCOCET/ROXICET  Take 1-2 tablets by mouth every 4 (four) hours as needed for severe pain.     zolpidem 10 MG tablet  Commonly known as:  AMBIEN  Take 0.5 tablets (5 mg total) by mouth at bedtime as needed for sleep.        No orders of the defined types were placed in this encounter.    Immunization History  Administered Date(s) Administered  . Hepatitis B 11/10/2007, 03/22/2008, 09/06/2008  . Influenza Whole 02/03/2011  . Influenza,inj,Quad PF,36+ Mos 03/01/2013  . PPD Test 09/22/2013  . Pneumococcal Polysaccharide-23 07/09/2007, 03/01/2013    Family History  Problem Relation Age of Onset  . Diabetes Mother   . Hypertension Mother     History  Substance Use Topics  . Smoking status: Never Smoker   . Smokeless tobacco: Never Used  . Alcohol Use: 1.5 oz/week    3 Standard drinks or equivalent per week     Comment: wine or Arts alcohol    Review of Systems   As noted in  HPI  Filed Vitals:   08/21/14 1634  BP: 140/80  Pulse:   Temp:   Resp:     Physical Exam  Physical Exam  Constitutional: No distress.  Eyes: EOM are normal. Pupils are equal, round, and reactive to light.  Cardiovascular: Normal rate and regular rhythm.   Pulmonary/Chest: Breath sounds normal. No respiratory distress. She has no wheezes. She has no rales.  Abdominal: Soft. Bowel sounds are normal. She exhibits no distension. There is no tenderness. There is no rebound.  Musculoskeletal: She exhibits no edema.    CBC    Component Value Date/Time   WBC 4.7 08/10/2014 1536   WBC 4.5 08/31/2013 1515   RBC 4.30 08/10/2014 1536   RBC 4.18 08/31/2013 1515   HGB 12.7 08/10/2014 1536   HGB 12.7 08/31/2013 1515   HCT 37.6 08/10/2014 1536   HCT 39.3 08/31/2013 1515    PLT 215 08/10/2014 1536   PLT 156 08/31/2013 1515   MCV 87.4 08/10/2014 1536   MCV 94.0 08/31/2013 1515   LYMPHSABS 1.1 08/10/2014 1536   LYMPHSABS 1.2 08/31/2013 1515   MONOABS 0.3 08/10/2014 1536   MONOABS 0.3 08/31/2013 1515   EOSABS 0.1 08/10/2014 1536   EOSABS 0.0 08/31/2013 1515   BASOSABS 0.0 08/10/2014 1536   BASOSABS 0.0 08/31/2013 1515    CMP     Component Value Date/Time   NA 138 08/10/2014 1536   NA 142 08/31/2013 1515   K 3.7 08/10/2014 1536   K 3.4* 08/31/2013 1515   CL 102 08/10/2014 1536   CL 104 07/07/2012 1512   CO2 25 08/10/2014 1536   CO2 27 08/31/2013 1515   GLUCOSE 78 08/10/2014 1536   GLUCOSE 69* 08/31/2013 1515   GLUCOSE 88 07/07/2012 1512   BUN 15 08/10/2014 1536   BUN 14.7 08/31/2013 1515   CREATININE 0.72 08/10/2014 1536   CREATININE 0.86 07/22/2014 1409   CREATININE 0.8 08/31/2013 1515   CALCIUM 9.3 08/10/2014 1536   CALCIUM 9.6 08/31/2013 1515   PROT 7.8 08/10/2014 1536   PROT 8.8* 08/31/2013 1515   ALBUMIN 3.8 08/10/2014 1536   ALBUMIN 3.7 08/31/2013 1515   AST 27 08/10/2014 1536   AST 25 08/31/2013 1515   ALT 13 08/10/2014 1536   ALT 9 08/31/2013 1515   ALKPHOS 97 08/10/2014 1536   ALKPHOS 99 08/31/2013 1515   BILITOT 0.5 08/10/2014 1536   BILITOT 0.67 08/31/2013 1515   GFRNONAA 88 08/10/2014 1536   GFRNONAA 69* 07/22/2014 1409   GFRAA >89 08/10/2014 1536   GFRAA 80* 07/22/2014 1409    Lab Results  Component Value Date/Time   CHOL 142 12/29/2013 04:42 PM    No components found for: HGA1C  Lab Results  Component Value Date/Time   AST 27 08/10/2014 03:36 PM   AST 25 08/31/2013 03:15 PM    Assessment and Plan  Gallstones - Plan: Ambulatory referral to General Surgery  Essential hypertension, benign Currently patient is on amlodipine 10 mg, advised patient for DASH diet, continue to follow with her ID specialist.    Return in about 6 months (around 02/20/2015), or if symptoms worsen or fail to improve.   This note  has been created with Surveyor, quantity. Any transcriptional errors are unintentional.    Lorayne Marek, MD

## 2014-08-21 NOTE — Patient Instructions (Signed)
DASH Eating Plan °DASH stands for "Dietary Approaches to Stop Hypertension." The DASH eating plan is a healthy eating plan that has been shown to reduce high blood pressure (hypertension). Additional health benefits may include reducing the risk of type 2 diabetes mellitus, heart disease, and stroke. The DASH eating plan may also help with weight loss. °WHAT DO I NEED TO KNOW ABOUT THE DASH EATING PLAN? °For the DASH eating plan, you will follow these general guidelines: °· Choose foods with a percent daily value for sodium of less than 5% (as listed on the food label). °· Use salt-free seasonings or herbs instead of table salt or sea salt. °· Check with your health care provider or pharmacist before using salt substitutes. °· Eat lower-sodium products, often labeled as "lower sodium" or "no salt added." °· Eat fresh foods. °· Eat more vegetables, fruits, and low-fat dairy products. °· Choose whole grains. Look for the word "whole" as the first word in the ingredient list. °· Choose fish and skinless chicken or turkey more often than red meat. Limit fish, poultry, and meat to 6 oz (170 g) each day. °· Limit sweets, desserts, sugars, and sugary drinks. °· Choose heart-healthy fats. °· Limit cheese to 1 oz (28 g) per day. °· Eat more home-cooked food and less restaurant, buffet, and fast food. °· Limit fried foods. °· Cook foods using methods other than frying. °· Limit canned vegetables. If you do use them, rinse them well to decrease the sodium. °· When eating at a restaurant, ask that your food be prepared with less salt, or no salt if possible. °WHAT FOODS CAN I EAT? °Seek help from a dietitian for individual calorie needs. °Grains °Whole grain or whole wheat bread. Brown rice. Whole grain or whole wheat pasta. Quinoa, bulgur, and whole grain cereals. Low-sodium cereals. Corn or whole wheat flour tortillas. Whole grain cornbread. Whole grain crackers. Low-sodium crackers. °Vegetables °Fresh or frozen vegetables  (raw, steamed, roasted, or grilled). Low-sodium or reduced-sodium tomato and vegetable juices. Low-sodium or reduced-sodium tomato sauce and paste. Low-sodium or reduced-sodium canned vegetables.  °Fruits °All fresh, canned (in natural juice), or frozen fruits. °Meat and Other Protein Products °Ground beef (85% or leaner), grass-fed beef, or beef trimmed of fat. Skinless chicken or turkey. Ground chicken or turkey. Pork trimmed of fat. All fish and seafood. Eggs. Dried beans, peas, or lentils. Unsalted nuts and seeds. Unsalted canned beans. °Dairy °Low-fat dairy products, such as skim or 1% milk, 2% or reduced-fat cheeses, low-fat ricotta or cottage cheese, or plain low-fat yogurt. Low-sodium or reduced-sodium cheeses. °Fats and Oils °Tub margarines without trans fats. Light or reduced-fat mayonnaise and salad dressings (reduced sodium). Avocado. Safflower, olive, or canola oils. Natural peanut or almond butter. °Other °Unsalted popcorn and pretzels. °The items listed above may not be a complete list of recommended foods or beverages. Contact your dietitian for more options. °WHAT FOODS ARE NOT RECOMMENDED? °Grains °Heiman bread. Uddin pasta. Graves rice. Refined cornbread. Bagels and croissants. Crackers that contain trans fat. °Vegetables °Creamed or fried vegetables. Vegetables in a cheese sauce. Regular canned vegetables. Regular canned tomato sauce and paste. Regular tomato and vegetable juices. °Fruits °Dried fruits. Canned fruit in light or heavy syrup. Fruit juice. °Meat and Other Protein Products °Fatty cuts of meat. Ribs, chicken wings, bacon, sausage, bologna, salami, chitterlings, fatback, hot dogs, bratwurst, and packaged luncheon meats. Salted nuts and seeds. Canned beans with salt. °Dairy °Whole or 2% milk, cream, half-and-half, and cream cheese. Whole-fat or sweetened yogurt. Full-fat   cheeses or blue cheese. Nondairy creamers and whipped toppings. Processed cheese, cheese spreads, or cheese  curds. °Condiments °Onion and garlic salt, seasoned salt, table salt, and sea salt. Canned and packaged gravies. Worcestershire sauce. Tartar sauce. Barbecue sauce. Teriyaki sauce. Soy sauce, including reduced sodium. Steak sauce. Fish sauce. Oyster sauce. Cocktail sauce. Horseradish. Ketchup and mustard. Meat flavorings and tenderizers. Bouillon cubes. Hot sauce. Tabasco sauce. Marinades. Taco seasonings. Relishes. °Fats and Oils °Butter, stick margarine, lard, shortening, ghee, and bacon fat. Coconut, palm kernel, or palm oils. Regular salad dressings. °Other °Pickles and olives. Salted popcorn and pretzels. °The items listed above may not be a complete list of foods and beverages to avoid. Contact your dietitian for more information. °WHERE CAN I FIND MORE INFORMATION? °National [employer], [employer], [employer]: www.nhlbi.nih.gov/health/health-topics/topics/dash/ °Document Released: 04/24/2011 Document Revised: 09/19/2013 Document Reviewed: 03/09/2013 °ExitCare® Patient Information ©2015 [employer]. This information is not intended to replace advice given to you by your health care provider. Make sure you discuss any questions you have with your health care provider. ° °

## 2014-08-21 NOTE — Progress Notes (Signed)
Patient here for referral to GI so that she can get her gall bladder removed Patient has had quite a few episodes and would like the surgery

## 2014-08-24 ENCOUNTER — Ambulatory Visit (INDEPENDENT_AMBULATORY_CARE_PROVIDER_SITE_OTHER): Payer: Commercial Managed Care - HMO | Admitting: Internal Medicine

## 2014-08-24 ENCOUNTER — Encounter: Payer: Self-pay | Admitting: Internal Medicine

## 2014-08-24 VITALS — BP 145/95 | HR 76 | Temp 98.8°F | Wt 145.0 lb

## 2014-08-24 DIAGNOSIS — B182 Chronic viral hepatitis C: Secondary | ICD-10-CM | POA: Diagnosis not present

## 2014-08-24 DIAGNOSIS — I10 Essential (primary) hypertension: Secondary | ICD-10-CM | POA: Diagnosis not present

## 2014-08-24 DIAGNOSIS — B20 Human immunodeficiency virus [HIV] disease: Secondary | ICD-10-CM | POA: Diagnosis not present

## 2014-08-24 NOTE — Progress Notes (Signed)
Patient ID: Vanessa Beard, female   DOB: 08-24-1950, 64 y.o.   MRN: [id number]       Patient ID: Vanessa Beard, female   DOB: 10-14-1950, 64 y.o.   MRN: [id number]  HPI 64yo F with HIV disease- chronic hepatitis C, CD 4 count of 270/VL<20 (march 2016),on triomeq. Since her last visit, she was approved for harvoni, which she has just started her 3rd week of medicaition. she is Doing well with it, no missing doses. Only noticed some fatigue on the 1st dose otherwise doing well. She is worried about her mother, being evaluated for aneurysm.  Outpatient Encounter Prescriptions as of 08/24/2014  Medication Sig  . Abacavir-Dolutegravir-Lamivud 600-50-300 MG TABS Take 1 tablet by mouth daily.  Marland Kitchen amLODipine (NORVASC) 10 MG tablet Take 1 tablet (10 mg total) by mouth daily.  Marland Kitchen HYDROcodone-acetaminophen (NORCO/VICODIN) 5-325 MG per tablet Take 1 tablet by mouth 2 (two) times daily as needed for moderate pain.  . Ledipasvir-Sofosbuvir (HARVONI) 90-400 MG TABS Take 1 tablet by mouth daily.  . ondansetron (ZOFRAN) 4 MG tablet Take 1 tablet (4 mg total) by mouth every 6 (six) hours.  Marland Kitchen zolpidem (AMBIEN) 10 MG tablet Take 0.5 tablets (5 mg total) by mouth at bedtime as needed for sleep.  Marland Kitchen doxycycline (VIBRA-TABS) 100 MG tablet Take 1 tablet (100 mg total) by mouth 2 (two) times daily. (Patient not taking: Reported on 03/26/2014)  . ondansetron (ZOFRAN ODT) 8 MG disintegrating tablet 8mg  ODT q4 hours prn nausea (Patient taking differently: Take 8 mg by mouth every 8 (eight) hours as needed for nausea. )  . oxyCODONE-acetaminophen (PERCOCET) 5-325 MG per tablet Take 1-2 tablets by mouth every 4 (four) hours as needed for moderate pain. (Patient not taking: Reported on 08/24/2014)  . oxyCODONE-acetaminophen (PERCOCET/ROXICET) 5-325 MG per tablet Take 1-2 tablets by mouth every 4 (four) hours as needed for severe pain. (Patient not taking: Reported on 08/24/2014)     Patient Active Problem List   Diagnosis Date  Noted  . ASCUS with positive high risk HPV 10/31/2013  . Gall bladder disease 02/03/2013  . 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  . Compression fracture of L3 lumbar vertebra 07/01/2011  . Thyroid dysfunction 07/01/2011  . Anemia 07/01/2011  . Sore throat 04/02/2011  . Hyperparathyroidism 02/10/2011  . Osteopenia 02/03/2011  . PAP SMEAR, LGSIL, ABNORMAL 12/21/2009  . COUGH 07/18/2009  . HEPATITIS C, CHRONIC 12/19/2008  . LEUKORRHEA 04/07/2008  . FATIGUE 04/07/2008  . BACK PAIN, LUMBAR 11/10/2007  . ALLERGIC RHINITIS 07/09/2007  . PNEUMOCYSTIS PNEUMONIA 05/07/2007  . HYPERTENSION 05/07/2007  . DENTAL CARIES 05/07/2007  . INSOMNIA 05/07/2007  . PNEUMONIA, HX OF 05/07/2007  . HERPES ZOSTER, HX OF 05/07/2007  . Human Immunodeficiency Virus (HIV) Disease 08/04/2006     Health Maintenance Due  Topic Date Due  . TETANUS/TDAP  03/07/1968  . COLONOSCOPY  03/08/1999  . ZOSTAVAX  03/07/2009  . PNA vac Low Risk Adult (1 of 2 - PCV13) 03/07/2014  . PAP SMEAR  07/23/2014     Review of Systems   10 point ros reviewed, nothing revealing  Physical Exam   BP 145/95 mmHg  Pulse 76  Temp(Src) 98.8 F (37.1 C) (Oral)  Wt 145 lb (65.772 kg) No exam today  Lab Results  Component Value Date   CD4TCELL 27* 08/10/2014   Lab Results  Component Value Date   CD4TABS 270* 08/10/2014   CD4TABS 240* 06/12/2014   CD4TABS 330* 12/29/2013   Lab  Results  Component Value Date   HIV1RNAQUANT <20 08/10/2014   Lab Results  Component Value Date   HEPBSAB NEG 12/29/2013   No results found for: RPR  CBC Lab Results  Component Value Date   WBC 4.7 08/10/2014   RBC 4.30 08/10/2014   HGB 12.7 08/10/2014   HCT 37.6 08/10/2014   PLT 215 08/10/2014   MCV 87.4 08/10/2014   MCH 29.5 08/10/2014   MCHC 33.8 08/10/2014   RDW 14.3 08/10/2014   LYMPHSABS 1.1 08/10/2014   MONOABS 0.3 08/10/2014   EOSABS 0.1 08/10/2014   BASOSABS 0.0 08/10/2014   BMET Lab  Results  Component Value Date   NA 138 08/10/2014   K 3.7 08/10/2014   CL 102 08/10/2014   CO2 25 08/10/2014   GLUCOSE 78 08/10/2014   BUN 15 08/10/2014   CREATININE 0.72 08/10/2014   CALCIUM 9.3 08/10/2014   GFRNONAA 88 08/10/2014   GFRAA >89 08/10/2014     Assessment and Plan Chronic hepatitis c without hepatic coma: continue on harvoni, currently on 3 of 12 wks of therapy. We have labs already scheduled for hep c at 4 wk mark. Plan to finish out course of therapy and have her rtc in 9 wk at completion of harvoni.  HIV disease = continue with taking triumeq. Excellent adherence. Well controlled.  htn = currently on amlodipine. If still elevated at next visit, may need to add hctz

## 2014-09-06 ENCOUNTER — Other Ambulatory Visit: Payer: Self-pay | Admitting: Internal Medicine

## 2014-09-06 DIAGNOSIS — Z1231 Encounter for screening mammogram for malignant neoplasm of breast: Secondary | ICD-10-CM

## 2014-09-07 ENCOUNTER — Other Ambulatory Visit (INDEPENDENT_AMBULATORY_CARE_PROVIDER_SITE_OTHER): Payer: Commercial Managed Care - HMO

## 2014-09-07 DIAGNOSIS — B182 Chronic viral hepatitis C: Secondary | ICD-10-CM

## 2014-09-08 LAB — HEPATITIS C RNA QUANTITATIVE: HCV Quantitative: NOT DETECTED IU/mL (ref <15)

## 2014-09-14 DIAGNOSIS — K8070 Calculus of gallbladder and bile duct without cholecystitis without obstruction: Secondary | ICD-10-CM | POA: Diagnosis not present

## 2014-09-14 DIAGNOSIS — Z21 Asymptomatic human immunodeficiency virus [HIV] infection status: Secondary | ICD-10-CM | POA: Diagnosis not present

## 2014-09-14 DIAGNOSIS — Z2252 Carrier of viral hepatitis C: Secondary | ICD-10-CM | POA: Diagnosis not present

## 2014-09-29 ENCOUNTER — Ambulatory Visit (HOSPITAL_COMMUNITY)
Admission: RE | Admit: 2014-09-29 | Discharge: 2014-09-29 | Disposition: A | Discharge status: Home / Self Care | Payer: Commercial Managed Care - HMO | Source: Ambulatory Visit | Attending: Internal Medicine | Admitting: Internal Medicine

## 2014-09-29 DIAGNOSIS — Z1231 Encounter for screening mammogram for malignant neoplasm of breast: Secondary | ICD-10-CM | POA: Diagnosis not present

## 2014-10-17 ENCOUNTER — Encounter: Payer: Self-pay | Admitting: Internal Medicine

## 2014-10-17 ENCOUNTER — Ambulatory Visit (INDEPENDENT_AMBULATORY_CARE_PROVIDER_SITE_OTHER): Payer: Commercial Managed Care - HMO | Admitting: Internal Medicine

## 2014-10-17 VITALS — BP 137/90 | HR 73 | Temp 98.4°F | Ht 60.0 in | Wt 142.0 lb

## 2014-10-17 DIAGNOSIS — B182 Chronic viral hepatitis C: Secondary | ICD-10-CM

## 2014-10-17 DIAGNOSIS — B20 Human immunodeficiency virus [HIV] disease: Secondary | ICD-10-CM | POA: Diagnosis not present

## 2014-10-17 DIAGNOSIS — R5383 Other fatigue: Secondary | ICD-10-CM | POA: Diagnosis not present

## 2014-10-17 DIAGNOSIS — G4700 Insomnia, unspecified: Secondary | ICD-10-CM | POA: Diagnosis not present

## 2014-10-17 DIAGNOSIS — R5382 Chronic fatigue, unspecified: Secondary | ICD-10-CM

## 2014-10-17 DIAGNOSIS — D518 Other vitamin B12 deficiency anemias: Secondary | ICD-10-CM | POA: Diagnosis not present

## 2014-10-17 LAB — VITAMIN B12: Vitamin B-12: 539 pg/mL (ref 211–911)

## 2014-10-17 MED ORDER — ZOLPIDEM TARTRATE 10 MG PO TABS: 5.0000 mg | ORAL_TABLET | Freq: Every evening | ORAL | Status: DC | PRN

## 2014-10-17 NOTE — Progress Notes (Signed)
Patient ID: Vanessa Beard, female   DOB: Apr 08, 1951, 64 y.o.   MRN: [id number]       Patient ID: Vanessa Beard, female   DOB: May 10, 1951, 64 y.o.   MRN: [id number]  HPI 64yo F with HIV disease- HCV co infection. Well controlled HIV disease with triumeq. She is also Currently on 11 of 12 weeks of harvoni, HCV VL detected. Feels she has ongoing fatigue. Feels she has decreased libido. Is looking into stopping work and doing retirement plan. Has been worried since she lost her partial disability check.  Outpatient Encounter Prescriptions as of 10/17/2014  Medication Sig  . Abacavir-Dolutegravir-Lamivud 600-50-300 MG TABS Take 1 tablet by mouth daily.  Marland Kitchen amLODipine (NORVASC) 10 MG tablet Take 1 tablet (10 mg total) by mouth daily.  . Ledipasvir-Sofosbuvir (HARVONI) 90-400 MG TABS Take 1 tablet by mouth daily.  . ondansetron (ZOFRAN ODT) 8 MG disintegrating tablet 8mg  ODT q4 hours prn nausea (Patient taking differently: Take 8 mg by mouth every 8 (eight) hours as needed for nausea. )  . ondansetron (ZOFRAN) 4 MG tablet Take 1 tablet (4 mg total) by mouth every 6 (six) hours.  Marland Kitchen zolpidem (AMBIEN) 10 MG tablet Take 0.5 tablets (5 mg total) by mouth at bedtime as needed for sleep.  . [DISCONTINUED] HYDROcodone-acetaminophen (NORCO/VICODIN) 5-325 MG per tablet Take 1 tablet by mouth 2 (two) times daily as needed for moderate pain.  . [DISCONTINUED] doxycycline (VIBRA-TABS) 100 MG tablet Take 1 tablet (100 mg total) by mouth 2 (two) times daily. (Patient not taking: Reported on 03/26/2014)  . [DISCONTINUED] oxyCODONE-acetaminophen (PERCOCET) 5-325 MG per tablet Take 1-2 tablets by mouth every 4 (four) hours as needed for moderate pain. (Patient not taking: Reported on 08/24/2014)  . [DISCONTINUED] oxyCODONE-acetaminophen (PERCOCET/ROXICET) 5-325 MG per tablet Take 1-2 tablets by mouth every 4 (four) hours as needed for severe pain. (Patient not taking: Reported on 08/24/2014)   No facility-administered  encounter medications on file as of 10/17/2014.     Patient Active Problem List   Diagnosis Date Noted  . ASCUS with positive high risk HPV 10/31/2013  . Gall bladder disease 02/03/2013  . 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  . Compression fracture of L3 lumbar vertebra 07/01/2011  . Thyroid dysfunction 07/01/2011  . Anemia 07/01/2011  . Sore throat 04/02/2011  . Hyperparathyroidism 02/10/2011  . Osteopenia 02/03/2011  . PAP SMEAR, LGSIL, ABNORMAL 12/21/2009  . COUGH 07/18/2009  . HEPATITIS C, CHRONIC 12/19/2008  . LEUKORRHEA 04/07/2008  . FATIGUE 04/07/2008  . BACK PAIN, LUMBAR 11/10/2007  . ALLERGIC RHINITIS 07/09/2007  . PNEUMOCYSTIS PNEUMONIA 05/07/2007  . HYPERTENSION 05/07/2007  . DENTAL CARIES 05/07/2007  . INSOMNIA 05/07/2007  . PNEUMONIA, HX OF 05/07/2007  . HERPES ZOSTER, HX OF 05/07/2007  . Human immunodeficiency virus (HIV) disease 08/04/2006     Health Maintenance Due  Topic Date Due  . TETANUS/TDAP  03/07/1968  . COLONOSCOPY  03/08/1999  . ZOSTAVAX  03/07/2009  . PNA vac Low Risk Adult (1 of 2 - PCV13) 03/07/2014  . PAP SMEAR  07/23/2014     Review of Systems  Physical Exam   BP 137/90 mmHg  Pulse 73  Temp(Src) 98.4 F (36.9 C) (Oral)  Ht 5' (1.524 m)  Wt 142 lb (64.411 kg)  BMI 27.73 kg/m2 Physical Exam  Constitutional:  oriented to person, place, and time. appears well-developed and well-nourished. No distress.  HENT: [place]/AT, PERRLA, no scleral icterus Mouth/Throat: Oropharynx is clear and moist. No oropharyngeal exudate.  Cardiovascular: Normal rate, regular rhythm and normal heart sounds. Exam reveals no gallop and no friction rub.  No murmur heard.  Pulmonary/Chest: Effort normal and breath sounds normal. No respiratory distress.  has no wheezes.  Neck = supple, no nuchal rigidity Abdominal: Soft. Bowel sounds are normal.  exhibits no distension. There is no tenderness.  Lymphadenopathy: no cervical adenopathy.  No axillary adenopathy Neurological: alert and oriented to person, place, and time.  Skin: Skin is warm and dry. No rash noted. No erythema.  Psychiatric: a normal mood and affect.  behavior is normal.   Lab Results  Component Value Date   CD4TCELL 27* 08/10/2014   Lab Results  Component Value Date   CD4TABS 270* 08/10/2014   CD4TABS 240* 06/12/2014   CD4TABS 330* 12/29/2013   Lab Results  Component Value Date   HIV1RNAQUANT <20 08/10/2014   Lab Results  Component Value Date   HEPBSAB NEG 12/29/2013   No results found for: RPR  CBC Lab Results  Component Value Date   WBC 4.7 08/10/2014   RBC 4.30 08/10/2014   HGB 12.7 08/10/2014   HCT 37.6 08/10/2014   PLT 215 08/10/2014   MCV 87.4 08/10/2014   MCH 29.5 08/10/2014   MCHC 33.8 08/10/2014   RDW 14.3 08/10/2014   LYMPHSABS 1.1 08/10/2014   MONOABS 0.3 08/10/2014   EOSABS 0.1 08/10/2014   BASOSABS 0.0 08/10/2014   BMET Lab Results  Component Value Date   NA 138 08/10/2014   K 3.7 08/10/2014   CL 102 08/10/2014   CO2 25 08/10/2014   GLUCOSE 78 08/10/2014   BUN 15 08/10/2014   CREATININE 0.72 08/10/2014   CALCIUM 9.3 08/10/2014   GFRNONAA 88 08/10/2014   GFRAA >89 08/10/2014     Assessment and Plan  HIV disease = well controlled  HCV chronic, without hepatic coma = she will finish in 2 wk, then hcv vl in 14 wks to document SVR 12  Fatigue = will check vit b 12, although likely situation depression  Insomnia = will refill Lorrin Mais

## 2014-10-17 NOTE — Progress Notes (Signed)
Subjective:    Patient ID: Vanessa Beard, female    DOB: 19-Mar-1951, 64 y.o.   MRN: [id number]  HPI Vanessa Beard is a 64 y/o female here for HIV f/u and Hep C treatment with Harvoni. She is currently on week 11 of 12 and is undetectable. Her HIV VL is undetectable and CD4 count is 270. She notes that she is very fatigued and has multiple stressors in her life. 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 She is currently in a relationship that she states has not been going well. She does note that she has decreased sexual interest, she feels depressed and she is requesting a vitamin B12 shot to boost her energy level.   Outpatient Encounter Prescriptions as of 10/17/2014  Medication Sig Note  . Abacavir-Dolutegravir-Lamivud 600-50-300 MG TABS Take 1 tablet by mouth daily.   Marland Kitchen amLODipine (NORVASC) 10 MG tablet Take 1 tablet (10 mg total) by mouth daily.   . Ledipasvir-Sofosbuvir (HARVONI) 90-400 MG TABS Take 1 tablet by mouth daily.   . ondansetron (ZOFRAN ODT) 8 MG disintegrating tablet 8mg  ODT q4 hours prn nausea (Patient taking differently: Take 8 mg by mouth every 8 (eight) hours as needed for nausea. )   . ondansetron (ZOFRAN) 4 MG tablet Take 1 tablet (4 mg total) by mouth every 6 (six) hours.   Marland Kitchen zolpidem (AMBIEN) 10 MG tablet Take 0.5 tablets (5 mg total) by mouth at bedtime as needed for sleep.   . [DISCONTINUED] HYDROcodone-acetaminophen (NORCO/VICODIN) 5-325 MG per tablet Take 1 tablet by mouth 2 (two) times daily as needed for moderate pain.   . [DISCONTINUED] zolpidem (AMBIEN) 10 MG tablet Take 0.5 tablets (5 mg total) by mouth at bedtime as needed for sleep. 07/22/2014: Pt had whole tablet last night.   . [DISCONTINUED] doxycycline (VIBRA-TABS) 100 MG tablet Take 1 tablet (100 mg total) by mouth 2 (two) times daily. (Patient not taking: Reported on 03/26/2014)   . [DISCONTINUED] oxyCODONE-acetaminophen (PERCOCET) 5-325  MG per tablet Take 1-2 tablets by mouth every 4 (four) hours as needed for moderate pain. (Patient not taking: Reported on 08/24/2014)   . [DISCONTINUED] oxyCODONE-acetaminophen (PERCOCET/ROXICET) 5-325 MG per tablet Take 1-2 tablets by mouth every 4 (four) hours as needed for severe pain. (Patient not taking: Reported on 08/24/2014)    No facility-administered encounter medications on file as of 10/17/2014.    Review of Systems  Constitutional: Positive for fatigue. Negative for fever and unexpected weight change.  HENT: Negative for mouth sores and sore throat.   Eyes: Negative for visual disturbance.  Respiratory: Negative for shortness of breath.   Cardiovascular: Negative for chest pain and palpitations.  Gastrointestinal: Negative for abdominal pain and abdominal distention.  Genitourinary: Negative for difficulty urinating.  Musculoskeletal: Negative for arthralgias.  Skin: Negative for rash.  Neurological: Negative for syncope, weakness and numbness.  Hematological: Negative for adenopathy.  Psychiatric/Behavioral: Positive for sleep disturbance.       Feels depressed, difficulty sleeping       Objective:   Physical Exam  Constitutional: She appears well-developed and well-nourished.  HENT:  Head: Normocephalic and atraumatic.  Mouth/Throat: No oropharyngeal exudate.  Eyes: Pupils are equal, round, and reactive to light. No scleral icterus.  Neck: Normal range of motion. Neck supple.  Cardiovascular: Normal rate, regular rhythm and normal heart sounds.   Lymphadenopathy:    She has no cervical adenopathy.    Lab Results  Component Value Date   HIV1RNAQUANT <20 08/10/2014   Lab Results  Component Value Date   CD4TABS 270* 08/10/2014   CD4TABS 240* 06/12/2014   CD4TABS 330* 12/29/2013  Blood pressure 137/90, pulse 73, temperature 98.4 F (36.9 C), temperature source Oral, height 5' (1.524 m), weight 142 lb (64.411 kg).      Assessment & Plan:  HIV, well  controlled: - Continue current regimen - Recheck labs in   Hep C: - Finish out [employer]  - Will f/u SVR 12   Decreased sexual drive: - Believe this to be r/t situational depression and fatigue r/t stressors and Harvoni treatment - will reassess after treatment with Harvoni is over  Fatigue: - Will check B12 level and supplement if needed.

## 2014-10-18 LAB — HEPATITIS A ANTIBODY, TOTAL: Hep A Total Ab: REACTIVE — AB

## 2014-10-20 ENCOUNTER — Other Ambulatory Visit: Payer: Self-pay | Admitting: Licensed Clinical Social Worker

## 2014-10-20 DIAGNOSIS — I10 Essential (primary) hypertension: Secondary | ICD-10-CM

## 2014-10-20 MED ORDER — AMLODIPINE BESYLATE 10 MG PO TABS: 10.0000 mg | ORAL_TABLET | Freq: Every day | ORAL | Status: DC

## 2014-11-03 ENCOUNTER — Encounter (HOSPITAL_COMMUNITY): Payer: Self-pay | Admitting: Emergency Medicine

## 2014-11-03 ENCOUNTER — Telehealth: Payer: Self-pay | Admitting: Gastroenterology

## 2014-11-03 ENCOUNTER — Emergency Department (HOSPITAL_COMMUNITY): Payer: Commercial Managed Care - HMO

## 2014-11-03 ENCOUNTER — Emergency Department (HOSPITAL_COMMUNITY)
Admission: EM | Admit: 2014-11-03 | Discharge: 2014-11-04 | Disposition: A | Discharge status: Home / Self Care | Payer: Commercial Managed Care - HMO | Attending: Emergency Medicine | Admitting: Emergency Medicine

## 2014-11-03 DIAGNOSIS — Z8739 Personal history of other diseases of the musculoskeletal system and connective tissue: Secondary | ICD-10-CM | POA: Diagnosis not present

## 2014-11-03 DIAGNOSIS — K8050 Calculus of bile duct without cholangitis or cholecystitis without obstruction: Secondary | ICD-10-CM

## 2014-11-03 DIAGNOSIS — K8020 Calculus of gallbladder without cholecystitis without obstruction: Secondary | ICD-10-CM | POA: Insufficient documentation

## 2014-11-03 DIAGNOSIS — Z21 Asymptomatic human immunodeficiency virus [HIV] infection status: Secondary | ICD-10-CM | POA: Diagnosis not present

## 2014-11-03 DIAGNOSIS — I10 Essential (primary) hypertension: Secondary | ICD-10-CM | POA: Diagnosis not present

## 2014-11-03 DIAGNOSIS — R110 Nausea: Secondary | ICD-10-CM | POA: Insufficient documentation

## 2014-11-03 DIAGNOSIS — Z79899 Other long term (current) drug therapy: Secondary | ICD-10-CM | POA: Diagnosis not present

## 2014-11-03 DIAGNOSIS — R1011 Right upper quadrant pain: Secondary | ICD-10-CM | POA: Diagnosis not present

## 2014-11-03 DIAGNOSIS — Z8619 Personal history of other infectious and parasitic diseases: Secondary | ICD-10-CM | POA: Insufficient documentation

## 2014-11-03 LAB — COMPREHENSIVE METABOLIC PANEL
ALT: 57 U/L — ABNORMAL HIGH (ref 14–54)
AST: 108 U/L — ABNORMAL HIGH (ref 15–41)
Albumin: 3.6 g/dL (ref 3.5–5.0)
Alkaline Phosphatase: 173 U/L — ABNORMAL HIGH (ref 38–126)
Anion gap: 7 (ref 5–15)
BUN: 13 mg/dL (ref 6–20)
CO2: 26 mmol/L (ref 22–32)
Calcium: 9.5 mg/dL (ref 8.9–10.3)
Chloride: 107 mmol/L (ref 101–111)
Creatinine, Ser: 0.95 mg/dL (ref 0.44–1.00)
GFR calc Af Amer: >60 mL/min (ref >60)
GFR calc non Af Amer: >60 mL/min (ref >60)
Glucose, Bld: 96 mg/dL (ref 65–99)
Potassium: 3.1 mmol/L — ABNORMAL LOW (ref 3.5–5.1)
Sodium: 140 mmol/L (ref 135–145)
Total Bilirubin: 1.7 mg/dL — ABNORMAL HIGH (ref 0.3–1.2)
Total Protein: 8.7 g/dL — ABNORMAL HIGH (ref 6.5–8.1)

## 2014-11-03 LAB — CBC WITH DIFFERENTIAL/PLATELET
Basophils Absolute: 0 10*3/uL (ref 0.0–0.1)
Basophils Relative: 0 % (ref 0–1)
Eosinophils Absolute: 0.1 10*3/uL (ref 0.0–0.7)
Eosinophils Relative: 2 % (ref 0–5)
HCT: 39.4 % (ref 36.0–46.0)
Hemoglobin: 13.2 g/dL (ref 12.0–15.0)
Lymphocytes Relative: 16 % (ref 12–46)
Lymphs Abs: 0.7 10*3/uL (ref 0.7–4.0)
MCH: 30.3 pg (ref 26.0–34.0)
MCHC: 33.5 g/dL (ref 30.0–36.0)
MCV: 90.6 fL (ref 78.0–100.0)
Monocytes Absolute: 0.4 10*3/uL (ref 0.1–1.0)
Monocytes Relative: 8 % (ref 3–12)
Neutro Abs: 3.3 10*3/uL (ref 1.7–7.7)
Neutrophils Relative %: 74 % (ref 43–77)
Platelets: 184 10*3/uL (ref 150–400)
RBC: 4.35 MIL/uL (ref 3.87–5.11)
RDW: 13.5 % (ref 11.5–15.5)
WBC: 4.5 10*3/uL (ref 4.0–10.5)

## 2014-11-03 LAB — URINALYSIS, ROUTINE W REFLEX MICROSCOPIC
Glucose, UA: NEGATIVE mg/dL
Hgb urine dipstick: NEGATIVE
Ketones, ur: NEGATIVE mg/dL
Nitrite: NEGATIVE
Protein, ur: NEGATIVE mg/dL
Specific Gravity, Urine: 1.022 (ref 1.005–1.030)
Urobilinogen, UA: >8 mg/dL — ABNORMAL HIGH (ref 0.0–1.0)
pH: 6 (ref 5.0–8.0)

## 2014-11-03 LAB — I-STAT TROPONIN, ED: Troponin i, poc: 0 ng/mL (ref 0.00–0.08)

## 2014-11-03 LAB — URINE MICROSCOPIC-ADD ON

## 2014-11-03 LAB — LIPASE, BLOOD: Lipase: 12 U/L — ABNORMAL LOW (ref 22–51)

## 2014-11-03 MED ORDER — ONDANSETRON HCL 4 MG/2ML IJ SOLN
4.0000 mg | Freq: Once | INTRAMUSCULAR | Status: AC
  Administered 2014-11-03: 4 mg via INTRAVENOUS
  Filled 2014-11-03: qty 2

## 2014-11-03 MED ORDER — SODIUM CHLORIDE 0.9 % IV BOLUS (SEPSIS)
1000.0000 mL | Freq: Once | INTRAVENOUS | Status: AC
  Administered 2014-11-03: 1000 mL via INTRAVENOUS

## 2014-11-03 MED ORDER — HYDROMORPHONE HCL 1 MG/ML IJ SOLN
1.0000 mg | Freq: Once | INTRAMUSCULAR | Status: AC
  Administered 2014-11-03: 1 mg via INTRAVENOUS
  Filled 2014-11-03: qty 1

## 2014-11-03 MED ORDER — OXYCODONE-ACETAMINOPHEN 5-325 MG PO TABS: 1.0000 | ORAL_TABLET | Freq: Four times a day (QID) | ORAL | Status: DC | PRN

## 2014-11-03 MED ORDER — ONDANSETRON HCL 4 MG PO TABS: 4.0000 mg | ORAL_TABLET | Freq: Three times a day (TID) | ORAL | Status: DC | PRN

## 2014-11-03 NOTE — ED Notes (Signed)
Pt reports epigastric pain radiating to back "cross her body ", also reports nausea, denies emesis nor diarrhea. . Was dx with gallbladder stones,pt is in obvious distress.

## 2014-11-03 NOTE — Discharge Instructions (Signed)
Follow-up with gastroenterology next week. Call Monday morning to confirm appointment if not first contacted by their office. Return to the ER with any severe abdominal pain, severe nausea, vomiting, high fever greater than 100.40F, worsening of symptoms.  Biliary Colic  Biliary colic is a steady or irregular pain in the upper abdomen. It is usually under the right side of the rib cage. It happens when gallstones interfere with the normal flow of bile from the gallbladder. Bile is a liquid that helps to digest fats. Bile is made in the liver and stored in the gallbladder. When you eat a meal, bile passes from the gallbladder through the cystic duct and the common bile duct into the small intestine. There, it mixes with partially digested food. If a gallstone blocks either of these ducts, the normal flow of bile is blocked. The muscle cells in the bile duct contract forcefully to try to move the stone. This causes the pain of biliary colic.  SYMPTOMS   A person with biliary colic usually complains of pain in the upper abdomen. This pain can be:  In the center of the upper abdomen just below the breastbone.  In the upper-right part of the abdomen, near the gallbladder and liver.  Spread back toward the right shoulder blade.  Nausea and vomiting.  The pain usually occurs after eating.  Biliary colic is usually triggered by the digestive system's demand for bile. The demand for bile is high after fatty meals. Symptoms can also occur when a person who has been fasting suddenly eats a very large meal. Most episodes of biliary colic pass after 1 to 5 hours. After the most intense pain passes, your abdomen may continue to ache mildly for about 24 hours. DIAGNOSIS  After you describe your symptoms, your caregiver will perform a physical exam. He or she will pay attention to the upper right portion of your belly (abdomen). This is the area of your liver and gallbladder. An ultrasound will help your  caregiver look for gallstones. Specialized scans of the gallbladder may also be done. Blood tests may be done, especially if you have fever or if your pain persists. PREVENTION  Biliary colic can be prevented by controlling the risk factors for gallstones. Some of these risk factors, such as heredity, increasing age, and pregnancy are a normal part of life. Obesity and a high-fat diet are risk factors you can change through a healthy lifestyle. Women going through menopause who take hormone replacement therapy (estrogen) are also more likely to develop biliary colic. TREATMENT   Pain medication may be prescribed.  You may be encouraged to eat a fat-free diet.  If the first episode of biliary colic is severe, or episodes of colic keep retuning, surgery to remove the gallbladder (cholecystectomy) is usually recommended. This procedure can be done through small incisions using an instrument called a laparoscope. The procedure often requires a brief stay in the hospital. Some people can leave the hospital the same day. It is the most widely used treatment in people troubled by painful gallstones. It is effective and safe, with no complications in more than 90% of cases.  If surgery cannot be done, medication that dissolves gallstones may be used. This medication is expensive and can take months or years to work. Only small stones will dissolve.  Rarely, medication to dissolve gallstones is combined with a procedure called shock-wave lithotripsy. This procedure uses carefully aimed shock waves to break up gallstones. In many people treated with this procedure,  gallstones form again within a few years. PROGNOSIS  If gallstones block your cystic duct or common bile duct, you are at risk for repeated episodes of biliary colic. There is also a 25% chance that you will develop a gallbladder infection(acute cholecystitis), or some other complication of gallstones within 10 to 20 years. If you have surgery,  schedule it at a time that is convenient for you and at a time when you are not sick. HOME CARE INSTRUCTIONS   Drink plenty of clear fluids.  Avoid fatty, greasy or fried foods, or any foods that make your pain worse.  Take medications as directed. SEEK MEDICAL CARE IF:   You develop a fever over 100.5 F (38.1 C).  Your pain gets worse over time.  You develop nausea that prevents you from eating and drinking.  You develop vomiting. SEEK IMMEDIATE MEDICAL CARE IF:   You have continuous or severe belly (abdominal) pain which is not relieved with medications.  You develop nausea and vomiting which is not relieved with medications.  You have symptoms of biliary colic and you suddenly develop a fever and shaking chills. This may signal cholecystitis. Call your caregiver immediately.  You develop a yellow color to your skin or the [place] part of your eyes (jaundice). Document Released: 10/06/2005 Document Revised: 07/28/2011 Document Reviewed: 12/16/2007 [facility] Patient Information 2015 [place], [place]. This information is not intended to replace advice given to you by your health care provider. Make sure you discuss any questions you have with your health care provider.

## 2014-11-03 NOTE — ED Notes (Signed)
[place] on going.

## 2014-11-03 NOTE — ED Provider Notes (Signed)
CSN: [id number]     Arrival date & time 11/03/14  1431 History   First MD Initiated Contact with Patient 11/03/14 1910     Chief Complaint  Patient presents with  . Abdominal Pain     (Consider location/radiation/quality/duration/timing/severity/associated sxs/prior Treatment) HPI Vanessa Beard is a 64 year-old female HIV positive with past medical history of hepatitis C, gallbladder disease, hypertension who presents the ER complaining of right upper quadrant abdominal pain. Patient states her pain began gradually last night, and has gradually worsened. Patient reports the right upper quadrant abdominal pain which is constant, waxing and waning quality. Patient states her symptoms feel identical to when she has had "gallbladder attacks" in the past. Patient reports associated nausea. Denies any vomiting, chest pain, shortness of breath, fever, diarrhea, constipation, dysuria.  Past Medical History  Diagnosis Date  . HIV infection   . Osteoporosis   . Gall bladder disease \  . Hypertension    Past Surgical History  Procedure Laterality Date  . Ectopic pregnancy surgery    . Shoulder surgery Right    Family History  Problem Relation Age of Onset  . Diabetes Mother   . Hypertension Mother    History  Substance Use Topics  . Smoking status: Never Smoker   . Smokeless tobacco: Never Used  . Alcohol Use: 1.8 oz/week    3 Standard drinks or equivalent per week     Comment: wine or [place] alcohol   OB History    Gravida Para Term Preterm AB TAB SAB Ectopic Multiple Living   5 3 3  0 2 1 0 1 0 2     Review of Systems  Constitutional: Negative for fever.  HENT: Negative for trouble swallowing.   Eyes: Negative for visual disturbance.  Respiratory: Negative for shortness of breath.   Cardiovascular: Negative for chest pain.  Gastrointestinal: Positive for nausea and abdominal pain. Negative for vomiting.  Genitourinary: Negative for dysuria.  Musculoskeletal: Negative for neck  pain.  Skin: Negative for rash.  Neurological: Negative for dizziness, weakness and numbness.  Psychiatric/Behavioral: Negative.       Allergies  Dapsone; Retrovir; and Sulfamethoxazole-trimethoprim  Home Medications   Prior to Admission medications   Medication Sig Start Date End Date Taking? Authorizing Provider  Abacavir-Dolutegravir-Lamivud 932-67-124 MG TABS Take 1 tablet by mouth daily. 03/14/14  Yes Carlyle Basques, MD  amLODipine (NORVASC) 10 MG tablet Take 1 tablet (10 mg total) by mouth daily. 10/20/14  Yes Carlyle Basques, MD  Ledipasvir-Sofosbuvir (HARVONI) 90-400 MG TABS Take 1 tablet by mouth daily. 08/09/14  Yes Carlyle Basques, MD  meloxicam (MOBIC) 15 MG tablet take 1 tablet by mouth once daily 10/27/14  Yes Historical Provider, MD  Oxycodone HCl 10 MG TABS Take 10 mg by mouth every 6 (six) hours as needed (pain).   Yes Historical Provider, MD  ondansetron (ZOFRAN ODT) 8 MG disintegrating tablet 8mg  ODT q4 hours prn nausea Patient taking differently: Take 8 mg by mouth every 8 (eight) hours as needed for nausea.  12/06/13   Malvin Johns, MD  ondansetron (ZOFRAN) 4 MG tablet Take 1 tablet (4 mg total) by mouth every 6 (six) hours. 07/22/14   Virgel Manifold, MD  ondansetron (ZOFRAN) 4 MG tablet Take 1 tablet (4 mg total) by mouth every 8 (eight) hours as needed for nausea or vomiting. 11/03/14   Dahlia Bailiff, PA-C  oxyCODONE-acetaminophen (PERCOCET) 5-325 MG per tablet Take 1-2 tablets by mouth every 6 (six) hours as needed. 11/03/14   Tiphanie Vo  Truitt Leep, PA-C  zolpidem (AMBIEN) 10 MG tablet Take 0.5 tablets (5 mg total) by mouth at bedtime as needed for sleep. 10/17/14   Carlyle Basques, MD   BP 137/89 mmHg  Pulse 57  Temp(Src) 97.7 F (36.5 C) (Oral)  Resp 19  SpO2 94% Physical Exam  Constitutional: She is oriented to person, place, and time. She appears well-developed and well-nourished. No distress.  HENT:  Head: Normocephalic and atraumatic.  Mouth/Throat: Oropharynx is clear and  moist. No oropharyngeal exudate.  Eyes: Right eye exhibits no discharge. Left eye exhibits no discharge. No scleral icterus.  Neck: Normal range of motion.  Cardiovascular: Normal rate, regular rhythm and normal heart sounds.   No murmur heard. Pulmonary/Chest: Effort normal and breath sounds normal. No respiratory distress.  Abdominal: Soft. There is tenderness in the right upper quadrant. There is positive Murphy's sign. There is no rigidity, no guarding and no tenderness at McBurney's point.  Musculoskeletal: Normal range of motion. She exhibits no edema or tenderness.  Neurological: She is alert and oriented to person, place, and time. No cranial nerve deficit. Coordination normal.  Skin: Skin is warm and dry. No rash noted. She is not diaphoretic.  Psychiatric: She has a normal mood and affect.  Nursing note and vitals reviewed.   ED Course  Procedures (including critical care time) Labs Review Labs Reviewed  COMPREHENSIVE METABOLIC PANEL - Abnormal; Notable for the following:    Potassium 3.1 (*)    Total Protein 8.7 (*)    AST 108 (*)    ALT 57 (*)    Alkaline Phosphatase 173 (*)    Total Bilirubin 1.7 (*)    All other components within normal limits  LIPASE, BLOOD - Abnormal; Notable for the following:    Lipase 12 (*)    All other components within normal limits  URINALYSIS, ROUTINE W REFLEX MICROSCOPIC (NOT AT [facility]) - Abnormal; Notable for the following:    Color, Urine ORANGE (*)    APPearance CLOUDY (*)    Bilirubin Urine MODERATE (*)    Urobilinogen, UA >8.0 (*)    Leukocytes, UA SMALL (*)    All other components within normal limits  URINE MICROSCOPIC-ADD ON - Abnormal; Notable for the following:    Bacteria, UA MANY (*)    All other components within normal limits  CBC WITH DIFFERENTIAL/PLATELET  Randolm Idol, ED    Imaging Review US Abdomen Limited Ruq  11/03/2014   CLINICAL DATA:  Right upper quadrant abdominal pain, history of gallstones  EXAM: US  ABDOMEN LIMITED - RIGHT UPPER QUADRANT  COMPARISON:  07/22/2014  FINDINGS: Gallbladder:  Gallstones reidentified within the gallbladder, largest 1 cm. The patient is currently on pain medication and sonographic Percell Miller sign could not be assessed. No gallbladder wall thickening or pericholecystic fluid.  Common bile duct:  Diameter: 1.2 cm with tapering to 1 cm distally without filling defect identified.  Liver:  No focal lesion identified. Within normal limits in parenchymal echogenicity.  IMPRESSION: Gallstones without other sonographic evidence for acute cholecystitis. Continued interval increase in common duct diameter with gradual tapering to the ampulla. This raises the question of common duct stone or stricture. Abdominal MRI/ MRCP with contrast could be helpful for further evaluation.   Electronically Signed   By: Conchita Paris M.D.   On: 11/03/2014 20:59     EKG Interpretation   Date/Time:  Friday November 03 2014 14:47:07 EDT Ventricular Rate:  68 PR Interval:  141 QRS Duration: 70 QT Interval:  387 QTC Calculation: 411 R Axis:   30 Text Interpretation:  Sinus rhythm Confirmed by DOCHERTY  MD, MEGAN [phone])  on 11/03/2014 2:51:44 PM      MDM   Final diagnoses:  RUQ abdominal pain  Biliary colic   Patient had some symptoms consistent with biliary colic. Based on mildly elevated hepatic function, will follow up with abdominal ultrasound  US abdomen with impression: Gallstones without other sonographic evidence for acute cholecystitis. Continued interval increase in common duct diameter with gradual tapering to the ampulla. This raises the question of common duct stone or stricture. Abdominal MRI/ MRCP with contrast could be helpful for further evaluation.  Consult to gastroenterology regarding these findings, spoke with Dr. Ardis Hughs, who recommends GI follow-up with patient and admission to medicine for inpatient GI consult.  Discussed this with patient, she states she is adamant  that she does not want to stay in the hospital. 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 She states she would much rather wait to the weekend and follow with GI as outpatient as she must take care of her mother over the weekend. I spoke with Dr. Ardis Hughs again regarding this. Dr. Ardis Hughs agreed that it would be reasonable at this point if patient is symptom controlled to have her follow up with them as outpatient next week. Dr. Ardis Hughs states that their office will contact her on Monday. I again discussed this with patient, patient remained adamant that she was to go home versus admission.  Patient is nontoxic, nonseptic appearing, in no apparent distress.  Patient's pain and other symptoms adequately managed in emergency department.  Fluid bolus given.  Labs, imaging and vitals reviewed.  Patient does not meet the SIRS or Sepsis criteria.  On repeat exam patient does not have a surgical abdomen and there are no peritoneal signs.  No indication of appendicitis, bowel obstruction, bowel perforation, cholecystitis, diverticulitis, PID or ectopic pregnancy.  No concern for ascending cholangitis. Patient discharged home with symptomatic treatment and given strict instructions for follow-up with gastroenterology next week. Return precautions discussed, patient verbalizes understanding and agreement of this plan.  BP 137/89 mmHg  Pulse 57  Temp(Src) 97.7 F (36.5 C) (Oral)  Resp 19  SpO2 94%  Signed,  Dahlia Bailiff, PA-C 1:55 AM      Dahlia Bailiff, PA-C 11/04/14 [id number]  Ernestina Patches, MD 11/04/14 [phone]

## 2014-11-03 NOTE — Telephone Encounter (Signed)
Was called by ER this evening; abd pain, elevated liver tests, dilated bile duct with stones in GB. They were planning to admit her which I agreed with. She preferred to go home have this taken care of as an outpatient.  I explained we would do our best to get her seen next week however the most expeditious care would be for her to be admitted, she declined.  Patty, Please offer her new appt with any provider, first available (MD or extender).

## 2014-11-06 NOTE — Telephone Encounter (Signed)
Pt has been scheduled for an appt with Jessica on 11/15/14 3 pm

## 2014-11-15 ENCOUNTER — Ambulatory Visit: Payer: Commercial Managed Care - HMO | Admitting: Gastroenterology

## 2014-12-07 ENCOUNTER — Encounter: Payer: Self-pay | Admitting: Gastroenterology

## 2014-12-11 ENCOUNTER — Ambulatory Visit (INDEPENDENT_AMBULATORY_CARE_PROVIDER_SITE_OTHER): Payer: Commercial Managed Care - HMO | Admitting: Gastroenterology

## 2014-12-11 ENCOUNTER — Other Ambulatory Visit (INDEPENDENT_AMBULATORY_CARE_PROVIDER_SITE_OTHER): Payer: Commercial Managed Care - HMO

## 2014-12-11 ENCOUNTER — Encounter: Payer: Self-pay | Admitting: Gastroenterology

## 2014-12-11 VITALS — BP 138/98 | Ht 60.5 in | Wt 137.5 lb

## 2014-12-11 DIAGNOSIS — R1013 Epigastric pain: Secondary | ICD-10-CM

## 2014-12-11 DIAGNOSIS — R7989 Other specified abnormal findings of blood chemistry: Secondary | ICD-10-CM

## 2014-12-11 DIAGNOSIS — R938 Abnormal findings on diagnostic imaging of other specified body structures: Secondary | ICD-10-CM

## 2014-12-11 DIAGNOSIS — R945 Abnormal results of liver function studies: Principal | ICD-10-CM

## 2014-12-11 DIAGNOSIS — R9389 Abnormal findings on diagnostic imaging of other specified body structures: Secondary | ICD-10-CM

## 2014-12-11 LAB — HEPATIC FUNCTION PANEL
ALT: 8 U/L (ref 0–35)
AST: 17 U/L (ref 0–37)
Albumin: 3.7 g/dL (ref 3.5–5.2)
Alkaline Phosphatase: 74 U/L (ref 39–117)
Bilirubin, Direct: 0.1 mg/dL (ref 0.0–0.3)
Total Bilirubin: 0.6 mg/dL (ref 0.2–1.2)
Total Protein: 8.1 g/dL (ref 6.0–8.3)

## 2014-12-11 NOTE — Progress Notes (Signed)
12/11/2014 Vanessa Beard [id number] [birth year]   HISTORY OF PRESENT ILLNESS:  This is a 64 year old female who is new to our practice.  She was referred here by the ED for evaluation regarding epigastric abdominal pain and elevated liver enzymes.  She presented to the hospital on 6/17 with complaints of upper abdominal pain.  LFT's were elevated as follows:  AST 108, ALT 57, ALP 173, and total bili 1.7.  They were previously normal.    Ultrasound showed gallstones with continued interval increase in common duct diameter with gradual tapering to the ampulla raising question of common duct stone or stricture.    ED MD wanted to have her admitted but patient declined and so was told to follow-up here.  She is just getting here now, but states that she has not had any pain since that ER visit.  She had other episodes of the same pain in the past, but they always resolve and once again there has been no further issues since 6/17.  Has HIV and Hepatitis, which are both undetectable on treatment.   Past Medical History  Diagnosis Date  . HIV infection   . Osteoporosis   . Gall bladder disease \  . Hypertension   . Insomnia   . Hepatitis C   . Biliary colic   . Pneumonia   . Allergic rhinitis   . Herpes   . Hyperparathyroidism    Past Surgical History  Procedure Laterality Date  . Ectopic pregnancy surgery    . Shoulder surgery Right     reports that she has never smoked. She has never used smokeless tobacco. She reports that she drinks about 1.8 oz of alcohol per week. She reports that she does not use illicit drugs. family history includes Diabetes in her mother; Hypertension in her mother. Allergies  Allergen Reactions  . Dapsone Other (See Comments)    stomach  burning  . Retrovir [Zidovudine] Other (See Comments)    Changed skin color  . Sulfamethoxazole-Trimethoprim Other (See Comments)    Flu like symptoms      Outpatient Encounter Prescriptions as of 12/11/2014    Medication Sig  . Abacavir-Dolutegravir-Lamivud 600-50-300 MG TABS Take 1 tablet by mouth daily.  Marland Kitchen amLODipine (NORVASC) 10 MG tablet Take 1 tablet (10 mg total) by mouth daily.  . Ledipasvir-Sofosbuvir (HARVONI) 90-400 MG TABS Take 1 tablet by mouth daily.  . meloxicam (MOBIC) 15 MG tablet take 1 tablet by mouth once daily  . ondansetron (ZOFRAN ODT) 8 MG disintegrating tablet 8mg  ODT q4 hours prn nausea (Patient taking differently: Take 8 mg by mouth every 8 (eight) hours as needed for nausea. )  . ondansetron (ZOFRAN) 4 MG tablet Take 1 tablet (4 mg total) by mouth every 6 (six) hours.  . ondansetron (ZOFRAN) 4 MG tablet Take 1 tablet (4 mg total) by mouth every 8 (eight) hours as needed for nausea or vomiting.  . Oxycodone HCl 10 MG TABS Take 10 mg by mouth every 6 (six) hours as needed (pain).  Marland Kitchen oxyCODONE-acetaminophen (PERCOCET) 5-325 MG per tablet Take 1-2 tablets by mouth every 6 (six) hours as needed.  . zolpidem (AMBIEN) 10 MG tablet Take 0.5 tablets (5 mg total) by mouth at bedtime as needed for sleep.   No facility-administered encounter medications on file as of 12/11/2014.     REVIEW OF SYSTEMS  : All other systems reviewed and negative except where noted in the History of Present Illness.  PHYSICAL EXAM: BP 138/98 mmHg  Ht 5' 0.5" (1.537 m)  Wt 137 lb 8 oz (62.37 kg)  BMI 26.40 kg/m2 General: Well developed female in no acute distress Head: Normocephalic and atraumatic Eyes:  Sclerae anicteric, conjunctiva pink. Ears: Normal auditory acuity Lungs: Clear throughout to auscultation Heart: Regular rate and rhythm Abdomen: Soft, non-distended.  Normal bowel sounds.  Non-tender. Musculoskeletal: Symmetrical with no gross deformities  Skin: No lesions on visible extremities Extremities: No edema  Neurological: Alert oriented x 4, grossly non-focal Psychological:  Alert and cooperative. Normal mood and affect  ASSESSMENT AND PLAN: -Episodes of epigastric abdominal  pain accompanied by increase in LFT's and abnormal ultrasound showing possible CBD stone.  Currently patient does not have any abdominal pain.  Will repeat LFT's today and order an MRCP/MRI abdomen.  If positive then will need ERCP.  Regardless she should have a lap chole so will refer to [facility] for evaluation.  CC:  Lorayne Marek, MD

## 2014-12-11 NOTE — Progress Notes (Signed)
Reviewed and agree with management. Yamilee Harmes D. Trenden Hazelrigg, M.D., FACG  

## 2014-12-11 NOTE — Patient Instructions (Signed)
Your physician has requested that you go to the basement for lab work before leaving today.  You have been scheduled for an MRI at [facility]  On 12/13/2014 . Your appointment time is 10:00am. Please arrive 15 minutes prior to your appointment time for registration purposes. Please make certain not to have anything to eat or drink 6 hours prior to your test. In addition, if you have any metal in your body, have a pacemaker or defibrillator, please be sure to let your ordering physician know. This test typically takes 45 minutes to 1 hour to complete.  We have referred you to [facility]. Your appointment is with Dr. Rosendo Gros on 12/19/2014 at 2:10pm. If ou need to reschedule call [phone].

## 2014-12-13 ENCOUNTER — Ambulatory Visit (HOSPITAL_COMMUNITY): Admission: RE | Admit: 2014-12-13 | Payer: Commercial Managed Care - HMO | Source: Ambulatory Visit

## 2014-12-18 HISTORY — PX: SHOULDER SURGERY: SHX246

## 2014-12-19 ENCOUNTER — Ambulatory Visit: Payer: Self-pay | Admitting: General Surgery

## 2014-12-19 DIAGNOSIS — Z2252 Carrier of viral hepatitis C: Secondary | ICD-10-CM | POA: Diagnosis not present

## 2014-12-19 DIAGNOSIS — K8020 Calculus of gallbladder without cholecystitis without obstruction: Secondary | ICD-10-CM | POA: Diagnosis not present

## 2014-12-19 DIAGNOSIS — Z21 Asymptomatic human immunodeficiency virus [HIV] infection status: Secondary | ICD-10-CM | POA: Diagnosis not present

## 2014-12-19 NOTE — H&P (Signed)
History of Present Illness [name] MD; 12/19/2014 3:29 PM) Patient words: gb.  The patient is a 64 year old female who presents for evaluation of gall stones. 64 year old female who was previously seen secondary to gallstones. Patient also has history of HIV and hepatitis C under control. Patient is undetectable viral load. Patient presented to the ER recently approximately a month ago with epigastric/left upper quadrant pain. She states that this initially began in the right upper quadrant. She states that she feels like she passed a stone while in the ER. Patient's laboratory studies in the ER revealed a tubular 1.7 with elevated AST ALT and alkaline phosphatase. Patient states that time the pain has not recurred. Patient also underwent ultrasound revealed multiple gallstones with the largest being 1 cm. There also appeared to be some tapering of the common bile duct. Patient has an MRI/MRCP ordered for a 8/3.   Allergies Elbert Ewings, CMA; 12/19/2014 3:13 PM) Dapsone *ANTI-INFECTIVE AGENTS - MISC.* Retrovir *ANTIVIRALS* Sulfamethoxazole-Trimethoprim *ANTI-INFECTIVE AGENTS - MISC.*  Medication History Elbert Ewings, CMA; 12/19/2014 3:13 PM) AmLODIPine Besylate (10MG  Tablet, Oral) Active. Harvoni (90-400MG  Tablet, Oral) Active. Triumeq (600-50-300MG  Tablet, Oral) Active. Medications Reconciled  Review of Systems [name] MD; 12/19/2014 3:27 PM) General Present- Appetite Loss, Chills, Fatigue, Night Sweats and Weight Gain. Not Present- Fever and Weight Loss. Skin Not Present- Change in Wart/Mole, Dryness, Hives, Jaundice, New Lesions, Non-Healing Wounds, Rash and Ulcer. HEENT Present- Nose Bleed, Seasonal Allergies, Visual Disturbances and Wears glasses/contact lenses. Not Present- Earache, Hearing Loss, Hoarseness, Oral Ulcers, Ringing in the Ears, Sinus Pain, Sore Throat and Yellow Eyes. Respiratory Not Present- Bloody sputum, Chronic Cough, Difficulty Breathing,  Snoring and Wheezing. Breast Not Present- Breast Mass, Breast Pain, Nipple Discharge and Skin Changes. Cardiovascular Present- Palpitations. Not Present- Chest Pain, Difficulty Breathing Lying Down, Leg Cramps, Rapid Heart Rate, Shortness of Breath and Swelling of Extremities. Gastrointestinal Present- Abdominal Pain, Bloating, Excessive gas, Gets full quickly at meals, Hemorrhoids, Indigestion, Nausea and Vomiting. Not Present- Bloody Stool, Change in Bowel Habits, Chronic diarrhea, Constipation, Difficulty Swallowing and Rectal Pain. Female Genitourinary Present- Frequency and Urgency. Not Present- Nocturia, Painful Urination and Pelvic Pain. Musculoskeletal Present- Back Pain. Not Present- Joint Pain, Joint Stiffness, Muscle Pain, Muscle Weakness and Swelling of Extremities. Neurological Present- Numbness. Not Present- Decreased Memory, Fainting, Headaches, Seizures, Tingling, Tremor, Trouble walking and Weakness. Psychiatric Present- Depression. Not Present- Anxiety, Bipolar, Change in Sleep Pattern, Fearful and Frequent crying. Endocrine Present- Hot flashes. Not Present- Cold Intolerance, Excessive Hunger, Hair Changes, Heat Intolerance and New Diabetes. Hematology Present- HIV. Not Present- Easy Bruising, Excessive bleeding, Gland problems and Persistent Infections.   Vitals Elbert Ewings CMA; 12/19/2014 3:14 PM) 12/19/2014 3:13 PM Weight: 141 lb Height: 62in Body Surface Area: 1.67 m Body Mass Index: 25.79 kg/m Temp.: 98.11F(Oral)  Pulse: 70 (Regular)  BP: 132/72 (Sitting, Left Arm, Standard)    Physical Exam [name] MD; 12/19/2014 3:27 PM) General Mental Status-Alert. General Appearance-Consistent with stated age. Hydration-Well hydrated. Voice-Normal.  Head and Neck Head-normocephalic, atraumatic with no lesions or palpable masses.  Chest and Lung Exam Chest and lung exam reveals -quiet, even and easy respiratory effort with no use of accessory  muscles and on auscultation, normal breath sounds, no adventitious sounds and normal vocal resonance. Inspection Chest Wall - Normal. Back - normal.  Cardiovascular Cardiovascular examination reveals -on palpation PMI is normal in location and amplitude, no palpable S3 or S4. Normal cardiac borders., normal heart sounds, regular rate and rhythm with no murmurs,  carotid auscultation reveals no bruits and normal pedal pulses bilaterally.  Abdomen Inspection Normal Exam - No Hernias. Skin - Scar - no surgical scars. Palpation/Percussion Normal exam - Soft, Non Tender, No Rebound tenderness, No Rigidity (guarding) and No hepatosplenomegaly. Auscultation Normal exam - Bowel sounds normal.  Neurologic Neurologic evaluation reveals -alert and oriented x 3 with no impairment of recent or remote memory. Mental Status-Normal.  Musculoskeletal Normal Exam - Left-Upper Extremity Strength Normal and Lower Extremity Strength Normal. Normal Exam - Right-Upper Extremity Strength Normal, Lower Extremity Weakness.    Assessment & Plan [name] MD; 12/19/2014 3:30 PM) SYMPTOMATIC CHOLELITHIASIS (574.20  K80.20) Impression: 64 year old female with likely Sx cholelithiasis some common bile duct tapering.  1. Will follow back up MRCP tomorrow. Thereafter there is normal routing we'll proceed with laparoscopic cholecystectomy. 2. Risks and benefits were discussed with the patient to generally include, but not limited to: infection, bleeding, possible need for post op ERCP, damage to the bile ducts, bile leak, and possible need for further surgery. Alternatives were offered and described. All questions were answered and the patient voiced understanding of the procedure and wishes to proceed at this point with a laparoscopic cholecystectomy

## 2014-12-20 ENCOUNTER — Other Ambulatory Visit: Payer: Self-pay | Admitting: Gastroenterology

## 2014-12-20 ENCOUNTER — Ambulatory Visit (HOSPITAL_COMMUNITY): Admission: RE | Admit: 2014-12-20 | Payer: Commercial Managed Care - HMO | Source: Ambulatory Visit

## 2014-12-20 ENCOUNTER — Ambulatory Visit (HOSPITAL_COMMUNITY)
Admission: RE | Admit: 2014-12-20 | Discharge: 2014-12-20 | Disposition: A | Discharge status: Home / Self Care | Payer: Commercial Managed Care - HMO | Source: Ambulatory Visit | Attending: Gastroenterology | Admitting: Gastroenterology

## 2014-12-20 DIAGNOSIS — R1011 Right upper quadrant pain: Secondary | ICD-10-CM | POA: Diagnosis present

## 2014-12-20 DIAGNOSIS — R9389 Abnormal findings on diagnostic imaging of other specified body structures: Secondary | ICD-10-CM

## 2014-12-20 DIAGNOSIS — K8020 Calculus of gallbladder without cholecystitis without obstruction: Secondary | ICD-10-CM | POA: Diagnosis not present

## 2014-12-20 DIAGNOSIS — R7989 Other specified abnormal findings of blood chemistry: Secondary | ICD-10-CM

## 2014-12-20 DIAGNOSIS — R945 Abnormal results of liver function studies: Principal | ICD-10-CM

## 2014-12-20 DIAGNOSIS — D472 Monoclonal gammopathy: Secondary | ICD-10-CM | POA: Diagnosis not present

## 2014-12-20 LAB — POCT I-STAT CREATININE: Creatinine, Ser: 0.8 mg/dL (ref 0.44–1.00)

## 2014-12-20 MED ORDER — GADOBENATE DIMEGLUMINE 529 MG/ML IV SOLN
15.0000 mL | Freq: Once | INTRAVENOUS | Status: AC | PRN
  Administered 2014-12-20: 12 mL via INTRAVENOUS

## 2015-01-02 ENCOUNTER — Telehealth: Payer: Self-pay | Admitting: *Deleted

## 2015-01-02 NOTE — Telephone Encounter (Signed)
PA for Zolpidem rx - Completed and sent for review.

## 2015-01-03 NOTE — Telephone Encounter (Signed)
Insurance company called for additional information regarding Ambien script.  Dose was needed along with duration of treatment.    Question: Does condition require more than 90 tablets for 365 days? Answer:  Yes for current situation.  They will fax the information on pre certification next day via fax .  Laverle Patter, RN

## 2015-01-04 NOTE — Telephone Encounter (Signed)
[employer] PA approved through 11/02/15.  Notified pharmacy.

## 2015-01-08 ENCOUNTER — Encounter (HOSPITAL_COMMUNITY): Payer: Self-pay | Admitting: *Deleted

## 2015-01-08 MED ORDER — CEFAZOLIN SODIUM-DEXTROSE 2-3 GM-% IV SOLR
2.0000 g | INTRAVENOUS | Status: AC
  Administered 2015-01-09: 2 g via INTRAVENOUS
  Filled 2015-01-08: qty 50

## 2015-01-08 MED ORDER — CHLORHEXIDINE GLUCONATE 4 % EX LIQD: 1.0000 "application " | Freq: Once | CUTANEOUS | Status: DC

## 2015-01-08 NOTE — Progress Notes (Signed)
Vanessa Beard has completed Harvoni, had normal liver function labs 7/16

## 2015-01-09 ENCOUNTER — Encounter (HOSPITAL_COMMUNITY): Payer: Self-pay | Admitting: *Deleted

## 2015-01-09 ENCOUNTER — Encounter (HOSPITAL_COMMUNITY)
Admission: RE | Disposition: A | Discharge status: Home / Self Care | Payer: Self-pay | Source: Ambulatory Visit | Attending: General Surgery

## 2015-01-09 ENCOUNTER — Ambulatory Visit (HOSPITAL_COMMUNITY)
Admission: RE | Admit: 2015-01-09 | Discharge: 2015-01-09 | Disposition: A | Discharge status: Home / Self Care | Payer: Commercial Managed Care - HMO | Source: Ambulatory Visit | Attending: General Surgery | Admitting: General Surgery

## 2015-01-09 ENCOUNTER — Ambulatory Visit (HOSPITAL_COMMUNITY): Payer: Commercial Managed Care - HMO | Admitting: Anesthesiology

## 2015-01-09 DIAGNOSIS — B20 Human immunodeficiency virus [HIV] disease: Secondary | ICD-10-CM | POA: Insufficient documentation

## 2015-01-09 DIAGNOSIS — Z882 Allergy status to sulfonamides status: Secondary | ICD-10-CM | POA: Diagnosis not present

## 2015-01-09 DIAGNOSIS — K8010 Calculus of gallbladder with chronic cholecystitis without obstruction: Secondary | ICD-10-CM | POA: Diagnosis not present

## 2015-01-09 DIAGNOSIS — K8020 Calculus of gallbladder without cholecystitis without obstruction: Secondary | ICD-10-CM | POA: Diagnosis not present

## 2015-01-09 DIAGNOSIS — B1920 Unspecified viral hepatitis C without hepatic coma: Secondary | ICD-10-CM | POA: Diagnosis not present

## 2015-01-09 DIAGNOSIS — I10 Essential (primary) hypertension: Secondary | ICD-10-CM | POA: Diagnosis not present

## 2015-01-09 DIAGNOSIS — Z79899 Other long term (current) drug therapy: Secondary | ICD-10-CM | POA: Diagnosis not present

## 2015-01-09 HISTORY — PX: CHOLECYSTECTOMY: SHX55

## 2015-01-09 LAB — COMPREHENSIVE METABOLIC PANEL
ALT: 11 U/L — ABNORMAL LOW (ref 14–54)
AST: 26 U/L (ref 15–41)
Albumin: 3.4 g/dL — ABNORMAL LOW (ref 3.5–5.0)
Alkaline Phosphatase: 71 U/L (ref 38–126)
Anion gap: 8 (ref 5–15)
BUN: 15 mg/dL (ref 6–20)
CO2: 23 mmol/L (ref 22–32)
Calcium: 9.3 mg/dL (ref 8.9–10.3)
Chloride: 106 mmol/L (ref 101–111)
Creatinine, Ser: 0.86 mg/dL (ref 0.44–1.00)
GFR calc Af Amer: >60 mL/min (ref >60)
GFR calc non Af Amer: >60 mL/min (ref >60)
Glucose, Bld: 84 mg/dL (ref 65–99)
Potassium: 3.9 mmol/L (ref 3.5–5.1)
Sodium: 137 mmol/L (ref 135–145)
Total Bilirubin: 0.7 mg/dL (ref 0.3–1.2)
Total Protein: 8.3 g/dL — ABNORMAL HIGH (ref 6.5–8.1)

## 2015-01-09 LAB — CBC
HCT: 40.1 % (ref 36.0–46.0)
Hemoglobin: 13.6 g/dL (ref 12.0–15.0)
MCH: 30.8 pg (ref 26.0–34.0)
MCHC: 33.9 g/dL (ref 30.0–36.0)
MCV: 90.9 fL (ref 78.0–100.0)
Platelets: 162 10*3/uL (ref 150–400)
RBC: 4.41 MIL/uL (ref 3.87–5.11)
RDW: 13.3 % (ref 11.5–15.5)
WBC: 3.9 10*3/uL — ABNORMAL LOW (ref 4.0–10.5)

## 2015-01-09 SURGERY — LAPAROSCOPIC CHOLECYSTECTOMY
Anesthesia: General | Site: Abdomen

## 2015-01-09 MED ORDER — SUCCINYLCHOLINE CHLORIDE 20 MG/ML IJ SOLN
INTRAMUSCULAR | Status: AC
  Filled 2015-01-09: qty 1

## 2015-01-09 MED ORDER — ONDANSETRON HCL 4 MG/2ML IJ SOLN
INTRAMUSCULAR | Status: AC
  Filled 2015-01-09: qty 2

## 2015-01-09 MED ORDER — ACETAMINOPHEN 325 MG PO TABS: 650.0000 mg | ORAL_TABLET | ORAL | Status: DC | PRN

## 2015-01-09 MED ORDER — SODIUM CHLORIDE 0.9 % IJ SOLN: 3.0000 mL | Freq: Two times a day (BID) | INTRAMUSCULAR | Status: DC

## 2015-01-09 MED ORDER — ONDANSETRON HCL 4 MG/2ML IJ SOLN
INTRAMUSCULAR | Status: DC | PRN
  Administered 2015-01-09: 4 mg via INTRAVENOUS

## 2015-01-09 MED ORDER — BUPIVACAINE HCL 0.25 % IJ SOLN
INTRAMUSCULAR | Status: DC | PRN
  Administered 2015-01-09: 1 mL
  Administered 2015-01-09: 6 mL

## 2015-01-09 MED ORDER — OXYCODONE-ACETAMINOPHEN 7.5-325 MG PO TABS: 1.0000 | ORAL_TABLET | ORAL | Status: DC | PRN

## 2015-01-09 MED ORDER — LIDOCAINE HCL (CARDIAC) 20 MG/ML IV SOLN
INTRAVENOUS | Status: AC
  Filled 2015-01-09: qty 5

## 2015-01-09 MED ORDER — SODIUM CHLORIDE 0.9 % IR SOLN
Status: DC | PRN
  Administered 2015-01-09: 1000 mL

## 2015-01-09 MED ORDER — HYDROMORPHONE HCL 1 MG/ML IJ SOLN
INTRAMUSCULAR | Status: AC
  Filled 2015-01-09: qty 1

## 2015-01-09 MED ORDER — PROPOFOL 10 MG/ML IV BOLUS
INTRAVENOUS | Status: DC | PRN
  Administered 2015-01-09: 150 mg via INTRAVENOUS

## 2015-01-09 MED ORDER — LIDOCAINE HCL (CARDIAC) 20 MG/ML IV SOLN
INTRAVENOUS | Status: DC | PRN
  Administered 2015-01-09: 40 mg via INTRAVENOUS

## 2015-01-09 MED ORDER — ROCURONIUM BROMIDE 50 MG/5ML IV SOLN
INTRAVENOUS | Status: AC
  Filled 2015-01-09: qty 1

## 2015-01-09 MED ORDER — LACTATED RINGERS IV SOLN
INTRAVENOUS | Status: DC
  Administered 2015-01-09 (×2): via INTRAVENOUS

## 2015-01-09 MED ORDER — HYDROMORPHONE HCL 1 MG/ML IJ SOLN
INTRAMUSCULAR | Status: AC
  Administered 2015-01-09: 0.5 mg via INTRAVENOUS
  Filled 2015-01-09: qty 1

## 2015-01-09 MED ORDER — MIDAZOLAM HCL 2 MG/2ML IJ SOLN
INTRAMUSCULAR | Status: AC
  Filled 2015-01-09: qty 4

## 2015-01-09 MED ORDER — HYDROMORPHONE HCL 1 MG/ML IJ SOLN
0.2500 mg | INTRAMUSCULAR | Status: DC | PRN
  Administered 2015-01-09: 1 mg via INTRAVENOUS
  Administered 2015-01-09 (×2): 0.5 mg via INTRAVENOUS

## 2015-01-09 MED ORDER — SODIUM CHLORIDE 0.9 % IV SOLN: 250.0000 mL | INTRAVENOUS | Status: DC | PRN

## 2015-01-09 MED ORDER — OXYCODONE HCL 5 MG PO TABS: 5.0000 mg | ORAL_TABLET | ORAL | Status: DC | PRN

## 2015-01-09 MED ORDER — SODIUM CHLORIDE 0.9 % IJ SOLN: 3.0000 mL | INTRAMUSCULAR | Status: DC | PRN

## 2015-01-09 MED ORDER — MIDAZOLAM HCL 5 MG/5ML IJ SOLN
INTRAMUSCULAR | Status: DC | PRN
  Administered 2015-01-09: 2 mg via INTRAVENOUS

## 2015-01-09 MED ORDER — 0.9 % SODIUM CHLORIDE (POUR BTL) OPTIME
TOPICAL | Status: DC | PRN
  Administered 2015-01-09: 1000 mL

## 2015-01-09 MED ORDER — FENTANYL CITRATE (PF) 100 MCG/2ML IJ SOLN
INTRAMUSCULAR | Status: DC | PRN
  Administered 2015-01-09 (×2): 50 ug via INTRAVENOUS
  Administered 2015-01-09: 100 ug via INTRAVENOUS
  Administered 2015-01-09: 50 ug via INTRAVENOUS

## 2015-01-09 MED ORDER — ONDANSETRON HCL 4 MG/2ML IJ SOLN
4.0000 mg | Freq: Once | INTRAMUSCULAR | Status: AC | PRN
  Administered 2015-01-09: 4 mg via INTRAVENOUS

## 2015-01-09 MED ORDER — ACETAMINOPHEN 650 MG RE SUPP: 650.0000 mg | RECTAL | Status: DC | PRN

## 2015-01-09 MED ORDER — FENTANYL CITRATE (PF) 250 MCG/5ML IJ SOLN
INTRAMUSCULAR | Status: AC
  Filled 2015-01-09: qty 5

## 2015-01-09 MED ORDER — NEOSTIGMINE METHYLSULFATE 10 MG/10ML IV SOLN
INTRAVENOUS | Status: DC | PRN
  Administered 2015-01-09: 3 mg via INTRAVENOUS

## 2015-01-09 MED ORDER — OXYCODONE HCL 5 MG PO TABS
ORAL_TABLET | ORAL | Status: AC
  Administered 2015-01-09: 5 mg via ORAL
  Filled 2015-01-09: qty 1

## 2015-01-09 MED ORDER — OXYCODONE HCL 5 MG/5ML PO SOLN: 5.0000 mg | Freq: Once | ORAL | Status: AC | PRN

## 2015-01-09 MED ORDER — OXYCODONE HCL 5 MG PO TABS
5.0000 mg | ORAL_TABLET | Freq: Once | ORAL | Status: AC | PRN
  Administered 2015-01-09: 5 mg via ORAL

## 2015-01-09 MED ORDER — BUPIVACAINE HCL (PF) 0.25 % IJ SOLN
INTRAMUSCULAR | Status: AC
  Filled 2015-01-09: qty 30

## 2015-01-09 MED ORDER — PROPOFOL 10 MG/ML IV BOLUS
INTRAVENOUS | Status: AC
  Filled 2015-01-09: qty 20

## 2015-01-09 MED ORDER — GLYCOPYRROLATE 0.2 MG/ML IJ SOLN
INTRAMUSCULAR | Status: DC | PRN
  Administered 2015-01-09: 0.4 mg via INTRAVENOUS

## 2015-01-09 MED ORDER — PHENYLEPHRINE 40 MCG/ML (10ML) SYRINGE FOR IV PUSH (FOR BLOOD PRESSURE SUPPORT)
PREFILLED_SYRINGE | INTRAVENOUS | Status: AC
  Filled 2015-01-09: qty 10

## 2015-01-09 MED ORDER — ROCURONIUM BROMIDE 100 MG/10ML IV SOLN
INTRAVENOUS | Status: DC | PRN
  Administered 2015-01-09: 40 mg via INTRAVENOUS

## 2015-01-09 SURGICAL SUPPLY — 43 items
APL SKNCLS STERI-STRIP NONHPOA (GAUZE/BANDAGES/DRESSINGS) ×1
BAG SPEC RTRVL LRG 6X4 10 (ENDOMECHANICALS)
BENZOIN TINCTURE PRP APPL 2/3 (GAUZE/BANDAGES/DRESSINGS) ×2 IMPLANT
CANISTER SUCTION 2500CC (MISCELLANEOUS) ×2 IMPLANT
CHLORAPREP W/TINT 26ML (MISCELLANEOUS) ×2 IMPLANT
CLIP LIGATING HEMO O LOK GREEN (MISCELLANEOUS) ×4 IMPLANT
COVER SURGICAL LIGHT HANDLE (MISCELLANEOUS) ×2 IMPLANT
COVER TRANSDUCER ULTRASND (DRAPES) ×2 IMPLANT
DEVICE TROCAR PUNCTURE CLOSURE (ENDOMECHANICALS) ×2 IMPLANT
ELECT REM PT RETURN 9FT ADLT (ELECTROSURGICAL) ×2
ELECTRODE REM PT RTRN 9FT ADLT (ELECTROSURGICAL) ×1 IMPLANT
GAUZE SPONGE 2X2 8PLY STRL LF (GAUZE/BANDAGES/DRESSINGS) ×1 IMPLANT
GLOVE BIO SURGEON STRL SZ 6.5 (GLOVE) ×2 IMPLANT
GLOVE BIO SURGEON STRL SZ7.5 (GLOVE) ×2 IMPLANT
GLOVE BIOGEL PI IND STRL 6.5 (GLOVE) ×1 IMPLANT
GLOVE BIOGEL PI IND STRL 7.5 (GLOVE) ×2 IMPLANT
GLOVE BIOGEL PI IND STRL 8 (GLOVE) ×1 IMPLANT
GLOVE BIOGEL PI INDICATOR 6.5 (GLOVE) ×1
GLOVE BIOGEL PI INDICATOR 7.5 (GLOVE) ×2
GLOVE BIOGEL PI INDICATOR 8 (GLOVE) ×1
GLOVE SURG SS PI 7.5 STRL IVOR (GLOVE) ×4 IMPLANT
GOWN STRL REUS W/ TWL LRG LVL3 (GOWN DISPOSABLE) ×2 IMPLANT
GOWN STRL REUS W/ TWL XL LVL3 (GOWN DISPOSABLE) ×1 IMPLANT
GOWN STRL REUS W/TWL LRG LVL3 (GOWN DISPOSABLE) ×4
GOWN STRL REUS W/TWL XL LVL3 (GOWN DISPOSABLE) ×2
KIT BASIN OR (CUSTOM PROCEDURE TRAY) ×2 IMPLANT
KIT ROOM TURNOVER OR (KITS) ×2 IMPLANT
NEEDLE INSUFFLATION 14GA 120MM (NEEDLE) ×2 IMPLANT
NS IRRIG 1000ML POUR BTL (IV SOLUTION) ×2 IMPLANT
PAD ARMBOARD 7.5X6 YLW CONV (MISCELLANEOUS) ×4 IMPLANT
POUCH SPECIMEN RETRIEVAL 10MM (ENDOMECHANICALS) IMPLANT
SCISSORS LAP 5X35 DISP (ENDOMECHANICALS) ×2 IMPLANT
SET IRRIG TUBING LAPAROSCOPIC (IRRIGATION / IRRIGATOR) ×2 IMPLANT
SLEEVE ENDOPATH XCEL 5M (ENDOMECHANICALS) ×4 IMPLANT
SPECIMEN JAR SMALL (MISCELLANEOUS) ×2 IMPLANT
SPONGE GAUZE 2X2 STER 10/PKG (GAUZE/BANDAGES/DRESSINGS) ×1
SUT MNCRL AB 3-0 PS2 18 (SUTURE) ×2 IMPLANT
TOWEL OR 17X24 6PK STRL BLUE (TOWEL DISPOSABLE) ×2 IMPLANT
TOWEL OR 17X26 10 PK STRL BLUE (TOWEL DISPOSABLE) ×2 IMPLANT
TRAY LAPAROSCOPIC MC (CUSTOM PROCEDURE TRAY) ×2 IMPLANT
TROCAR XCEL NON-BLD 11X100MML (ENDOMECHANICALS) ×2 IMPLANT
TROCAR XCEL NON-BLD 5MMX100MML (ENDOMECHANICALS) ×2 IMPLANT
TUBING INSUFFLATION (TUBING) ×2 IMPLANT

## 2015-01-09 NOTE — H&P (View-Only) (Signed)
History of Present Illness Vanessa Ok MD; 12/19/2014 3:29 PM) Patient words: gb.  The patient is a 64 year old female who presents for evaluation of gall stones. 64 year old female who was previously seen secondary to gallstones. Patient also has history of HIV and hepatitis C under control. Patient is undetectable viral load. Patient presented to the ER recently approximately a month ago with epigastric/left upper quadrant pain. She states that this initially began in the right upper quadrant. She states that she feels like she passed a stone while in the ER. Patient's laboratory studies in the ER revealed a tubular 1.7 with elevated AST ALT and alkaline phosphatase. Patient states that time the pain has not recurred. Patient also underwent ultrasound revealed multiple gallstones with the largest being 1 cm. There also appeared to be some tapering of the common bile duct. Patient has an MRI/MRCP ordered for a 8/3.   Allergies Elbert Ewings, CMA; 12/19/2014 3:13 PM) Dapsone *ANTI-INFECTIVE AGENTS - MISC.* Retrovir *ANTIVIRALS* Sulfamethoxazole-Trimethoprim *ANTI-INFECTIVE AGENTS - MISC.*  Medication History Elbert Ewings, CMA; 12/19/2014 3:13 PM) AmLODIPine Besylate (10MG  Tablet, Oral) Active. Harvoni (90-400MG  Tablet, Oral) Active. Triumeq (600-50-300MG  Tablet, Oral) Active. Medications Reconciled  Review of Systems Vanessa Ok MD; 12/19/2014 3:27 PM) General Present- Appetite Loss, Chills, Fatigue, Night Sweats and Weight Gain. Not Present- Fever and Weight Loss. Skin Not Present- Change in Wart/Mole, Dryness, Hives, Jaundice, New Lesions, Non-Healing Wounds, Rash and Ulcer. HEENT Present- Nose Bleed, Seasonal Allergies, Visual Disturbances and Wears glasses/contact lenses. Not Present- Earache, Hearing Loss, Hoarseness, Oral Ulcers, Ringing in the Ears, Sinus Pain, Sore Throat and Yellow Eyes. Respiratory Not Present- Bloody sputum, Chronic Cough, Difficulty Breathing,  Snoring and Wheezing. Breast Not Present- Breast Mass, Breast Pain, Nipple Discharge and Skin Changes. Cardiovascular Present- Palpitations. Not Present- Chest Pain, Difficulty Breathing Lying Down, Leg Cramps, Rapid Heart Rate, Shortness of Breath and Swelling of Extremities. Gastrointestinal Present- Abdominal Pain, Bloating, Excessive gas, Gets full quickly at meals, Hemorrhoids, Indigestion, Nausea and Vomiting. Not Present- Bloody Stool, Change in Bowel Habits, Chronic diarrhea, Constipation, Difficulty Swallowing and Rectal Pain. Female Genitourinary Present- Frequency and Urgency. Not Present- Nocturia, Painful Urination and Pelvic Pain. Musculoskeletal Present- Back Pain. Not Present- Joint Pain, Joint Stiffness, Muscle Pain, Muscle Weakness and Swelling of Extremities. Neurological Present- Numbness. Not Present- Decreased Memory, Fainting, Headaches, Seizures, Tingling, Tremor, Trouble walking and Weakness. Psychiatric Present- Depression. Not Present- Anxiety, Bipolar, Change in Sleep Pattern, Fearful and Frequent crying. Endocrine Present- Hot flashes. Not Present- Cold Intolerance, Excessive Hunger, Hair Changes, Heat Intolerance and New Diabetes. Hematology Present- HIV. Not Present- Easy Bruising, Excessive bleeding, Gland problems and Persistent Infections.   Vitals Elbert Ewings CMA; 12/19/2014 3:14 PM) 12/19/2014 3:13 PM Weight: 141 lb Height: 62in Body Surface Area: 1.67 m Body Mass Index: 25.79 kg/m Temp.: 98.72F(Oral)  Pulse: 70 (Regular)  BP: 132/72 (Sitting, Left Arm, Standard)    Physical Exam Vanessa Ok MD; 12/19/2014 3:27 PM) General Mental Status-Alert. General Appearance-Consistent with stated age. Hydration-Well hydrated. Voice-Normal.  Head and Neck Head-normocephalic, atraumatic with no lesions or palpable masses.  Chest and Lung Exam Chest and lung exam reveals -quiet, even and easy respiratory effort with no use of accessory  muscles and on auscultation, normal breath sounds, no adventitious sounds and normal vocal resonance. Inspection Chest Wall - Normal. Back - normal.  Cardiovascular Cardiovascular examination reveals -on palpation PMI is normal in location and amplitude, no palpable S3 or S4. Normal cardiac borders., normal heart sounds, regular rate and rhythm with no murmurs,  carotid auscultation reveals no bruits and normal pedal pulses bilaterally.  Abdomen Inspection Normal Exam - No Hernias. Skin - Scar - no surgical scars. Palpation/Percussion Normal exam - Soft, Non Tender, No Rebound tenderness, No Rigidity (guarding) and No hepatosplenomegaly. Auscultation Normal exam - Bowel sounds normal.  Neurologic Neurologic evaluation reveals -alert and oriented x 3 with no impairment of recent or remote memory. Mental Status-Normal.  Musculoskeletal Normal Exam - Left-Upper Extremity Strength Normal and Lower Extremity Strength Normal. Normal Exam - Right-Upper Extremity Strength Normal, Lower Extremity Weakness.    Assessment & Plan Vanessa Ok MD; 12/19/2014 3:30 PM) SYMPTOMATIC CHOLELITHIASIS (574.20  K80.20) Impression: 64 year old female with likely Sx cholelithiasis some common bile duct tapering.  1. Will follow back up MRCP tomorrow. Thereafter there is normal routing we'll proceed with laparoscopic cholecystectomy. 2. Risks and benefits were discussed with the patient to generally include, but not limited to: infection, bleeding, possible need for post op ERCP, damage to the bile ducts, bile leak, and possible need for further surgery. Alternatives were offered and described. All questions were answered and the patient voiced understanding of the procedure and wishes to proceed at this point with a laparoscopic cholecystectomy

## 2015-01-09 NOTE — Discharge Instructions (Signed)
[facility] ______[facility] °LAPAROSCOPIC SURGERY: POST OP INSTRUCTIONS °Always review your discharge instruction sheet given to you by the facility where your surgery was performed. °IF YOU HAVE DISABILITY OR FAMILY LEAVE FORMS, YOU MUST BRING THEM TO THE OFFICE FOR PROCESSING.   °DO NOT GIVE THEM TO YOUR DOCTOR. ° °1. A prescription for pain medication may be given to you upon discharge.  Take your pain medication as prescribed, if needed.  If narcotic pain medicine is not needed, then you may take acetaminophen (Tylenol) or ibuprofen (Advil) as needed. °2. Take your usually prescribed medications unless otherwise directed. °3. If you need a refill on your pain medication, please contact your pharmacy.  They will contact our office to request authorization. Prescriptions will not be filled after 5pm or on week-ends. °4. You should follow a light diet the first few days after arrival home, such as soup and crackers, etc.  Be sure to include lots of fluids daily. °5. Most patients will experience some swelling and bruising in the area of the incisions.  Ice packs will help.  Swelling and bruising can take several days to resolve.  °6. It is common to experience some constipation if taking pain medication after surgery.  Increasing fluid intake and taking a stool softener (such as Colace) will usually help or prevent this problem from occurring.  A mild laxative (Milk of Magnesia or Miralax) should be taken according to package instructions if there are no bowel movements after 48 hours. °7. Unless discharge instructions indicate otherwise, you may remove your bandages 24-48 hours after surgery, and you may shower at that time.  You may have steri-strips (small skin tapes) in place directly over the incision.  These strips should be left on the skin for 7-10 days.  If your surgeon used skin glue on the incision, you may shower in 24 hours.  The glue will flake off over the next 2-3 weeks.  Any sutures or  staples will be removed at the office during your follow-up visit. °8. ACTIVITIES:  You may resume regular (light) daily activities beginning the next day--such as daily self-care, walking, climbing stairs--gradually increasing activities as tolerated.  You may have sexual intercourse when it is comfortable.  Refrain from any heavy lifting or straining until approved by your doctor. °a. You may drive when you are no longer taking prescription pain medication, you can comfortably wear a seatbelt, and you can safely maneuver your car and apply brakes. °b. RETURN TO WORK:  __________________________________________________________ °9. You should see your doctor in the office for a follow-up appointment approximately 2-3 weeks after your surgery.  Make sure that you call for this appointment within a day or two after you arrive home to insure a convenient appointment time. °10. OTHER INSTRUCTIONS: __________________________________________________________________________________________________________________________ __________________________________________________________________________________________________________________________ °WHEN TO CALL YOUR DOCTOR: °1. Fever over 101.0 °2. Inability to urinate °3. Continued bleeding from incision. °4. Increased pain, redness, or drainage from the incision. °5. Increasing abdominal pain ° °The clinic staff is available to answer your questions during regular business hours.  Please don’t hesitate to call and ask to speak to one of the nurses for clinical concerns.  If you have a medical emergency, go to the nearest emergency room or call 911.  A surgeon from [facility] is always on call at the hospital. °[address], [address], [place], [place]  [address] ? [address] [address], [place], [place]   [address] °[phone] ? [phone] ? FAX [phone] °Web site:   [email or web address] °

## 2015-01-09 NOTE — Interval H&P Note (Signed)
History and Physical Interval Note:  01/09/2015 7:23 AM  Vanessa Beard  has presented today for surgery, with the diagnosis of Gallstones  The various methods of treatment have been discussed with the patient and family. After consideration of risks, benefits and other options for treatment, the patient has consented to  Procedure(s): LAPAROSCOPIC CHOLECYSTECTOMY (N/A) as a surgical intervention .  The patient's history has been reviewed, patient examined, no change in status, stable for surgery.  I have reviewed the patient's chart and labs.  Questions were answered to the patient's satisfaction.     Rosario Jacks., Anne Hahn

## 2015-01-09 NOTE — Anesthesia Procedure Notes (Signed)
Procedure Name: Intubation Date/Time: 01/09/2015 10:57 AM Performed by: Rebekah Chesterfield L Pre-anesthesia Checklist: Emergency Drugs available, Patient identified, Suction available, Patient being monitored and Timeout performed Patient Re-evaluated:Patient Re-evaluated prior to inductionOxygen Delivery Method: Circle system utilized Preoxygenation: Pre-oxygenation with 100% oxygen Intubation Type: IV induction Ventilation: Mask ventilation with difficulty and Oral airway inserted - appropriate to patient size Laryngoscope Size: Mac and 3 Grade View: Grade I Tube type: Oral Tube size: 7.0 mm Number of attempts: 1 Airway Equipment and Method: Stylet Placement Confirmation: ETT inserted through vocal cords under direct vision,  positive ETCO2 and breath sounds checked- equal and bilateral Secured at: 19 cm Tube secured with: Tape Dental Injury: Teeth and Oropharynx as per pre-operative assessment

## 2015-01-09 NOTE — Anesthesia Postprocedure Evaluation (Signed)
  Anesthesia Post-op Note  Patient: Vanessa Beard  Procedure(s) Performed: Procedure(s): LAPAROSCOPIC CHOLECYSTECTOMY (N/A)  Patient Location: PACU  Anesthesia Type:General  Level of Consciousness: awake, alert  and oriented  Airway and Oxygen Therapy: Patient Spontanous Breathing and Patient connected to nasal cannula oxygen  Post-op Pain: mild  Post-op Assessment: Post-op Vital signs reviewed, Patient's Cardiovascular Status Stable, Respiratory Function Stable, Patent Airway and Pain level controlled              Post-op Vital Signs: stable  Last Vitals:  Filed Vitals:   01/09/15 1355  BP: 133/70  Pulse: 48  Temp: 36.4 C  Resp: 12    Complications: No apparent anesthesia complications

## 2015-01-09 NOTE — Anesthesia Preprocedure Evaluation (Signed)
Anesthesia Evaluation  Patient identified by MRN, date of birth, ID band Patient awake    Reviewed: Allergy & Precautions, NPO status , Patient's Chart, lab work & pertinent test results  Airway Mallampati: II  TM Distance: >3 FB Neck ROM: Full    Dental  (+) Partial Upper, Partial Lower,    Pulmonary  breath sounds clear to auscultation        Cardiovascular hypertension, Rhythm:Regular Rate:Normal     Neuro/Psych    GI/Hepatic   Endo/Other    Renal/GU      Musculoskeletal   Abdominal   Peds  Hematology   Anesthesia Other Findings   Reproductive/Obstetrics                             Anesthesia Physical Anesthesia Plan  ASA: III  Anesthesia Plan: General   Post-op Pain Management:    Induction: Intravenous  Airway Management Planned: Oral ETT  Additional Equipment:   Intra-op Plan:   Post-operative Plan: Extubation in OR  Informed Consent: I have reviewed the patients History and Physical, chart, labs and discussed the procedure including the risks, benefits and alternatives for the proposed anesthesia with the patient or authorized representative who has indicated his/her understanding and acceptance.   Dental advisory given  Plan Discussed with: CRNA and Anesthesiologist  Anesthesia Plan Comments:         Anesthesia Quick Evaluation

## 2015-01-09 NOTE — Transfer of Care (Signed)
Immediate Anesthesia Transfer of Care Note  Patient: Vanessa Beard  Procedure(s) Performed: Procedure(s): LAPAROSCOPIC CHOLECYSTECTOMY (N/A)  Patient Location: PACU  Anesthesia Type:General  Level of Consciousness: awake, alert , oriented and patient cooperative  Airway & Oxygen Therapy: Patient Spontanous Breathing and Patient connected to nasal cannula oxygen  Post-op Assessment: Report given to RN, Post -op Vital signs reviewed and stable and Patient moving all extremities  Post vital signs: Reviewed and stable  Last Vitals:  Filed Vitals:   01/09/15 0901  BP: 140/94  Pulse: 63  Temp: 19.5 C    Complications: No apparent anesthesia complications

## 2015-01-09 NOTE — Op Note (Signed)
01/09/2015  11:55 AM  PATIENT:  Vanessa Beard  64 y.o. female  PRE-OPERATIVE DIAGNOSIS:  CHOLELITHIASIS  POST-OPERATIVE DIAGNOSIS:  CHOLELITHIASIS  PROCEDURE:  Procedure(s): LAPAROSCOPIC CHOLECYSTECTOMY (N/A)  SURGEON:  Surgeon(s) and Role:    * [name], MD - Primary  ASSISTANTS: Jacqulyn Ducking, RNFA-S   ANESTHESIA:   local and general  EBL:  5cc   BLOOD ADMINISTERED:none  DRAINS: none   LOCAL MEDICATIONS USED:  BUPIVICAINE   SPECIMEN:  Source of Specimen:  gallbladder  DISPOSITION OF SPECIMEN:  PATHOLOGY  COUNTS:  YES  TOURNIQUET:  * No tourniquets in log *  DICTATION: .Dragon Dictation The patient was taken to the operating and placed in the supine position with bilateral SCDs in place. The patient was prepped and draped in the usual sterile fashion. A time out was called and all facts were verified. A pneumoperitoneum was obtained via A Veress needle technique to a pressure of 68mm of mercury.  A 67mm trochar was then placed in the right upper quadrant under visualization, and there were no injuries to any abdominal organs. A 11 mm port was then placed in the umbilical region after infiltrating with local anesthesia under direct visualization. A second and third epigastric port and right lower quadrant port placement under direct visualization, respectively. The liver was very fatty and large. The gallbladder was identified and retracted, the peritoneum was then sharply dissected from the gallbladder and this dissection was carried down to Calot's triangle. The gallbladder was identified and stripped away circumferentially and seen going into the gallbladder 360, the critical angle was obtained.  2 clips were placed proximally one distally and the cystic duct transected. The cystic artery was identified and 2 clips placed proximally and one distally and transected. We then proceeded to remove the gallbladder off the hepatic fossa with Bovie cautery. A retrieval bag  was then placed in the abdomen and gallbladder placed in the bag. The hepatic fossa was then reexamined and hemostasis was achieved with Bovie cautery and was excellent at the end of the case. The subhepatic fossa and perihepatic fossa was then irrigated until the effluent was clear. The 11 mm trocar fascia was reapproximated with the Endo Close #1 Vicryl. The pneumoperitoneum was evacuated and all trochars removed under direct visulalization. The skin was then closed with 4-0 Monocryl and the skin dressed with Steri-Strips, gauze, and tape. The patient was awaken from general anesthesia and taken to the recovery room in stable condition.   PLAN OF CARE: Discharge to home after PACU  PATIENT DISPOSITION:  PACU - hemodynamically stable.   Delay start of Pharmacological VTE agent (>24hrs) due to surgical blood loss or risk of bleeding: not applicable

## 2015-01-10 ENCOUNTER — Encounter (HOSPITAL_COMMUNITY): Payer: Self-pay | Admitting: General Surgery

## 2015-01-16 ENCOUNTER — Telehealth: Payer: Self-pay

## 2015-01-16 NOTE — Telephone Encounter (Signed)
-----   Message from Boykin Nearing, MD sent at 01/12/2015  2:12 PM EDT ----- Pathology from gallbladder removal revealed inflammation and stones, otherwise normal

## 2015-01-16 NOTE — Telephone Encounter (Signed)
Patient not available Left message on voice mail to return our call 

## 2015-01-16 NOTE — Telephone Encounter (Signed)
Patient returned nurses phone call. Please follow up.

## 2015-01-19 ENCOUNTER — Telehealth: Payer: Self-pay

## 2015-01-19 NOTE — Telephone Encounter (Signed)
Returned patient phone call Patient not available Left message on voice mail to return our call 

## 2015-01-23 ENCOUNTER — Ambulatory Visit: Payer: Commercial Managed Care - HMO | Admitting: Internal Medicine

## 2015-01-29 ENCOUNTER — Ambulatory Visit: Payer: Commercial Managed Care - HMO | Admitting: Internal Medicine

## 2015-02-02 DIAGNOSIS — H5203 Hypermetropia, bilateral: Secondary | ICD-10-CM | POA: Diagnosis not present

## 2015-02-02 DIAGNOSIS — H25043 Posterior subcapsular polar age-related cataract, bilateral: Secondary | ICD-10-CM | POA: Diagnosis not present

## 2015-03-06 ENCOUNTER — Other Ambulatory Visit (HOSPITAL_COMMUNITY)
Admission: RE | Admit: 2015-03-06 | Discharge: 2015-03-06 | Disposition: A | Discharge status: Home / Self Care | Payer: Commercial Managed Care - HMO | Source: Ambulatory Visit | Attending: Internal Medicine | Admitting: Internal Medicine

## 2015-03-06 ENCOUNTER — Encounter: Payer: Self-pay | Admitting: Internal Medicine

## 2015-03-06 ENCOUNTER — Ambulatory Visit (INDEPENDENT_AMBULATORY_CARE_PROVIDER_SITE_OTHER): Payer: Commercial Managed Care - HMO | Admitting: Internal Medicine

## 2015-03-06 VITALS — Temp 97.7°F | Wt 140.0 lb

## 2015-03-06 DIAGNOSIS — G4700 Insomnia, unspecified: Secondary | ICD-10-CM

## 2015-03-06 DIAGNOSIS — Z113 Encounter for screening for infections with a predominantly sexual mode of transmission: Secondary | ICD-10-CM

## 2015-03-06 DIAGNOSIS — Z23 Encounter for immunization: Secondary | ICD-10-CM

## 2015-03-06 DIAGNOSIS — B182 Chronic viral hepatitis C: Secondary | ICD-10-CM

## 2015-03-06 DIAGNOSIS — Z79899 Other long term (current) drug therapy: Secondary | ICD-10-CM

## 2015-03-06 DIAGNOSIS — B20 Human immunodeficiency virus [HIV] disease: Secondary | ICD-10-CM

## 2015-03-06 LAB — COMPLETE METABOLIC PANEL WITH GFR
ALT: 7 U/L (ref 6–29)
AST: 16 U/L (ref 10–35)
Albumin: 3.7 g/dL (ref 3.6–5.1)
Alkaline Phosphatase: 80 U/L (ref 33–130)
BUN: 17 mg/dL (ref 7–25)
CO2: 27 mmol/L (ref 20–31)
Calcium: 9.5 mg/dL (ref 8.6–10.4)
Chloride: 107 mmol/L (ref 98–110)
Creat: 0.88 mg/dL (ref 0.50–0.99)
GFR, Est African American: 80 mL/min (ref >=60)
GFR, Est Non African American: 69 mL/min (ref >=60)
Glucose, Bld: 83 mg/dL (ref 65–99)
Potassium: 4.1 mmol/L (ref 3.5–5.3)
Sodium: 140 mmol/L (ref 135–146)
Total Bilirubin: 0.6 mg/dL (ref 0.2–1.2)
Total Protein: 7.4 g/dL (ref 6.1–8.1)

## 2015-03-06 LAB — CBC WITH DIFFERENTIAL/PLATELET
Basophils Absolute: 0 10*3/uL (ref 0.0–0.1)
Basophils Relative: 1 % (ref 0–1)
Eosinophils Absolute: 0.1 10*3/uL (ref 0.0–0.7)
Eosinophils Relative: 2 % (ref 0–5)
HCT: 36.9 % (ref 36.0–46.0)
Hemoglobin: 12.8 g/dL (ref 12.0–15.0)
Lymphocytes Relative: 17 % (ref 12–46)
Lymphs Abs: 0.7 10*3/uL (ref 0.7–4.0)
MCH: 30.9 pg (ref 26.0–34.0)
MCHC: 34.7 g/dL (ref 30.0–36.0)
MCV: 89.1 fL (ref 78.0–100.0)
MPV: 11.2 fL (ref 8.6–12.4)
Monocytes Absolute: 0.3 10*3/uL (ref 0.1–1.0)
Monocytes Relative: 8 % (ref 3–12)
Neutro Abs: 3.1 10*3/uL (ref 1.7–7.7)
Neutrophils Relative %: 72 % (ref 43–77)
Platelets: 179 10*3/uL (ref 150–400)
RBC: 4.14 MIL/uL (ref 3.87–5.11)
RDW: 14.1 % (ref 11.5–15.5)
WBC: 4.3 10*3/uL (ref 4.0–10.5)

## 2015-03-06 LAB — LIPID PANEL
Cholesterol: 141 mg/dL (ref 125–200)
HDL: 58 mg/dL (ref >=46)
LDL Cholesterol: 74 mg/dL (ref <130)
Total CHOL/HDL Ratio: 2.4 Ratio (ref <=5.0)
Triglycerides: 46 mg/dL (ref <150)
VLDL: 9 mg/dL (ref <30)

## 2015-03-06 MED ORDER — ZOLPIDEM TARTRATE 10 MG PO TABS: 10.0000 mg | ORAL_TABLET | Freq: Every evening | ORAL | Status: DC | PRN

## 2015-03-06 NOTE — Assessment & Plan Note (Signed)
Will try 10 mg Ambien.

## 2015-03-06 NOTE — Assessment & Plan Note (Signed)
Will check SVR24 today; considered cured if negative.

## 2015-03-06 NOTE — Progress Notes (Signed)
   Subjective:    Patient ID: Vanessa Beard, female    DOB: Jan 22, 1951, 64 y.o.   MRN: [id number]  HPI Here for follow up of HIV and hepatitis C.  Takes Triumeq and denies any missed doses.  Finished [place] about 6 months ago.  No new issues. Genotype 1b,  Viral load was 5,460,568, undetectable in April on [place].  MRI of liver without any liver cirrhosis identified.  Did not have elastography.  Some continued insomnia, 10 mg Ambien more effective.     Review of Systems  Constitutional: Negative for fatigue.  Gastrointestinal: Negative for nausea and diarrhea.  Skin: Negative for rash.  Neurological: Negative for dizziness and light-headedness.  Psychiatric/Behavioral: Positive for sleep disturbance.       Objective:   Physical Exam  Constitutional: She appears well-developed and well-nourished. No distress.  HENT:  Mouth/Throat: No oropharyngeal exudate.  Eyes: No scleral icterus.  Cardiovascular: Normal rate, regular rhythm and normal heart sounds.   No murmur heard. Pulmonary/Chest: Effort normal and breath sounds normal. No respiratory distress.  Lymphadenopathy:    She has no cervical adenopathy.  Skin: No rash noted.    Social History   Social History  . Marital Status: Divorced    Spouse Name: N/A  . Number of Children: N/A  . Years of Education: N/A   Occupational History  . CNA    Social History Main Topics  . Smoking status: Never Smoker   . Smokeless tobacco: Never Used  . Alcohol Use: 1.8 oz/week    3 Standard drinks or equivalent per week     Comment: wine or [place] alcohol.  Occasional  . Drug Use: No  . Sexual Activity: No     Comment: declined condoms   Other Topics Concern  . Not on file   Social History Narrative        Assessment & Plan:

## 2015-03-06 NOTE — Assessment & Plan Note (Signed)
Doing good on Triumeq and will continue.  Labs today and RTC 6 months unless concerns.

## 2015-03-07 LAB — URINE CYTOLOGY ANCILLARY ONLY
Chlamydia: NEGATIVE
Neisseria Gonorrhea: NEGATIVE

## 2015-03-07 LAB — HEPATITIS C RNA QUANTITATIVE: HCV Quantitative: NOT DETECTED IU/mL (ref <15)

## 2015-03-07 LAB — RPR

## 2015-03-07 LAB — T-HELPER CELL (CD4) - (RCID CLINIC ONLY)
CD4 % Helper T Cell: 17 % — ABNORMAL LOW (ref 33–55)
CD4 T Cell Abs: 140 /uL — ABNORMAL LOW (ref 400–2700)

## 2015-03-08 LAB — HIV-1 RNA QUANT-NO REFLEX-BLD
HIV 1 RNA Quant: <20 copies/mL (ref <20)
HIV-1 RNA Quant, Log: <1.3 Log copies/mL (ref <1.30)

## 2015-05-02 DIAGNOSIS — I10 Essential (primary) hypertension: Secondary | ICD-10-CM | POA: Diagnosis not present

## 2015-05-02 DIAGNOSIS — Z0000 Encounter for general adult medical examination without abnormal findings: Secondary | ICD-10-CM | POA: Diagnosis not present

## 2015-05-02 DIAGNOSIS — Z6826 Body mass index (BMI) 26.0-26.9, adult: Secondary | ICD-10-CM | POA: Diagnosis not present

## 2015-05-17 ENCOUNTER — Other Ambulatory Visit: Payer: Self-pay | Admitting: *Deleted

## 2015-05-17 DIAGNOSIS — B20 Human immunodeficiency virus [HIV] disease: Secondary | ICD-10-CM

## 2015-05-17 MED ORDER — ABACAVIR-DOLUTEGRAVIR-LAMIVUD 600-50-300 MG PO TABS: 1.0000 | ORAL_TABLET | Freq: Every day | ORAL | Status: DC

## 2015-08-28 ENCOUNTER — Other Ambulatory Visit: Payer: Commercial Managed Care - HMO

## 2015-08-28 DIAGNOSIS — B182 Chronic viral hepatitis C: Secondary | ICD-10-CM

## 2015-08-28 DIAGNOSIS — B20 Human immunodeficiency virus [HIV] disease: Secondary | ICD-10-CM

## 2015-08-28 LAB — CBC WITH DIFFERENTIAL/PLATELET
Basophils Absolute: 0 cells/uL (ref 0–200)
Basophils Relative: 0 %
Eosinophils Absolute: 49 cells/uL (ref 15–500)
Eosinophils Relative: 1 %
HCT: 38.2 % (ref 35.0–45.0)
Hemoglobin: 12.6 g/dL (ref 11.7–15.5)
Lymphocytes Relative: 27 %
Lymphs Abs: 1323 cells/uL (ref 850–3900)
MCH: 28.9 pg (ref 27.0–33.0)
MCHC: 33 g/dL (ref 32.0–36.0)
MCV: 87.6 fL (ref 80.0–100.0)
MPV: 11.6 fL (ref 7.5–12.5)
Monocytes Absolute: 343 cells/uL (ref 200–950)
Monocytes Relative: 7 %
Neutro Abs: 3185 cells/uL (ref 1500–7800)
Neutrophils Relative %: 65 %
Platelets: 232 10*3/uL (ref 140–400)
RBC: 4.36 MIL/uL (ref 3.80–5.10)
RDW: 14.9 % (ref 11.0–15.0)
WBC: 4.9 10*3/uL (ref 3.8–10.8)

## 2015-08-29 LAB — COMPREHENSIVE METABOLIC PANEL
ALT: 9 U/L (ref 6–29)
AST: 19 U/L (ref 10–35)
Albumin: 3.8 g/dL (ref 3.6–5.1)
Alkaline Phosphatase: 83 U/L (ref 33–130)
BUN: 17 mg/dL (ref 7–25)
CO2: 24 mmol/L (ref 20–31)
Calcium: 9.5 mg/dL (ref 8.6–10.4)
Chloride: 103 mmol/L (ref 98–110)
Creat: 1.26 mg/dL — ABNORMAL HIGH (ref 0.50–0.99)
Glucose, Bld: 96 mg/dL (ref 65–99)
Potassium: 3.5 mmol/L (ref 3.5–5.3)
Sodium: 138 mmol/L (ref 135–146)
Total Bilirubin: 0.6 mg/dL (ref 0.2–1.2)
Total Protein: 8.3 g/dL — ABNORMAL HIGH (ref 6.1–8.1)

## 2015-08-30 DIAGNOSIS — B20 Human immunodeficiency virus [HIV] disease: Secondary | ICD-10-CM | POA: Diagnosis not present

## 2015-08-30 LAB — HIV-1 RNA QUANT-NO REFLEX-BLD
HIV 1 RNA Quant: 62 copies/mL — ABNORMAL HIGH (ref <20)
HIV-1 RNA Quant, Log: 1.79 Log copies/mL — ABNORMAL HIGH (ref <1.30)

## 2015-08-30 LAB — T-HELPER CELL (CD4) - (RCID CLINIC ONLY)
CD4 % Helper T Cell: 23 % — ABNORMAL LOW (ref 33–55)
CD4 T Cell Abs: 300 /uL — ABNORMAL LOW (ref 400–2700)

## 2015-09-11 ENCOUNTER — Ambulatory Visit (INDEPENDENT_AMBULATORY_CARE_PROVIDER_SITE_OTHER): Payer: Commercial Managed Care - HMO | Admitting: Internal Medicine

## 2015-09-11 VITALS — BP 146/93 | HR 69 | Temp 98.1°F | Ht 60.0 in | Wt 141.0 lb

## 2015-09-11 DIAGNOSIS — B20 Human immunodeficiency virus [HIV] disease: Secondary | ICD-10-CM

## 2015-09-11 DIAGNOSIS — B182 Chronic viral hepatitis C: Secondary | ICD-10-CM

## 2015-09-11 DIAGNOSIS — F4321 Adjustment disorder with depressed mood: Secondary | ICD-10-CM

## 2015-09-11 DIAGNOSIS — Z0000 Encounter for general adult medical examination without abnormal findings: Secondary | ICD-10-CM

## 2015-09-11 NOTE — Progress Notes (Signed)
Patient ID: Vanessa Beard, female   DOB: [birth year], 65 y.o.   MRN: [id number]       Patient ID: Vanessa Beard, female   DOB: 24-Oct-1950, 65 y.o.   MRN: [id number]  HPI 65yo F with hiv disease, cd 4 count of 300/VL 62, on triumeq. Doing well with medications. She has been grieving the loss of her mother but is getting back into regular routine. Continues to work part time. No recent difficulties with health other than uri.  Outpatient Encounter Prescriptions as of 09/11/2015  Medication Sig  . abacavir-dolutegravir-lamiVUDine (TRIUMEQ) 600-50-300 MG tablet Take 1 tablet by mouth daily.  Marland Kitchen amLODipine (NORVASC) 10 MG tablet Take 1 tablet (10 mg total) by mouth daily.  Marland Kitchen zolpidem (AMBIEN) 10 MG tablet Take 1 tablet (10 mg total) by mouth at bedtime as needed for sleep.  Marland Kitchen ondansetron (ZOFRAN) 4 MG tablet Take 1 tablet (4 mg total) by mouth every 8 (eight) hours as needed for nausea or vomiting. (Patient not taking: Reported on 09/11/2015)   No facility-administered encounter medications on file as of 09/11/2015.     Patient Active Problem List   Diagnosis Date Noted  . Screening examination for venereal disease 03/06/2015  . Encounter for long-term (current) use of medications 03/06/2015  . Elevated LFTs 12/11/2014  . Abnormal ultrasound 12/11/2014  . Abdominal pain, epigastric 12/11/2014  . ASCUS with positive high risk HPV 10/31/2013  . Gall bladder disease 02/03/2013  . 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  . Compression fracture of L3 lumbar vertebra ([place]) 07/01/2011  . Thyroid dysfunction 07/01/2011  . Anemia 07/01/2011  . Sore throat 04/02/2011  . Hyperparathyroidism 02/10/2011  . Osteopenia 02/03/2011  . PAP SMEAR, LGSIL, ABNORMAL 12/21/2009  . COUGH 07/18/2009  . Chronic hepatitis C virus infection ([place]) 12/19/2008  . LEUKORRHEA 04/07/2008  . FATIGUE 04/07/2008  . BACK PAIN, LUMBAR 11/10/2007  . ALLERGIC RHINITIS 07/09/2007  . PNEUMOCYSTIS  PNEUMONIA 05/07/2007  . HYPERTENSION 05/07/2007  . DENTAL CARIES 05/07/2007  . INSOMNIA 05/07/2007  . PNEUMONIA, HX OF 05/07/2007  . HERPES ZOSTER, HX OF 05/07/2007  . Human immunodeficiency virus (HIV) disease ([place]) 08/04/2006     Health Maintenance Due  Topic Date Due  . TETANUS/TDAP  03/07/1968  . COLONOSCOPY  03/08/1999  . ZOSTAVAX  03/07/2009  . PNA vac Low Risk Adult (1 of 2 - PCV13) 03/07/2014  . PAP SMEAR  07/23/2014     Review of Systems + grieving, but improving. 10 point ros is negative Physical Exam   BP 146/93 mmHg  Pulse 69  Temp(Src) 98.1 F (36.7 C) (Oral)  Ht 5' (1.524 m)  Wt 141 lb (63.957 kg)  BMI 27.54 kg/m2 Physical Exam  Constitutional:  oriented to person, place, and time. appears well-developed and well-nourished. No distress.  HENT: [place]/AT, PERRLA, no scleral icterus Mouth/Throat: Oropharynx is clear and moist. No oropharyngeal exudate.  Cardiovascular: Normal rate, regular rhythm and normal heart sounds. Exam reveals no gallop and no friction rub.  No murmur heard.  Pulmonary/Chest: Effort normal and breath sounds normal. No respiratory distress.  has no wheezes.  Neck = supple, no nuchal rigidity Abdominal: Soft. Bowel sounds are normal.  exhibits no distension. There is no tenderness.  Lymphadenopathy: no cervical adenopathy. No axillary adenopathy Neurological: alert and oriented to person, place, and time.  Skin: Skin is warm and dry. No rash noted. No erythema.  Psychiatric: a normal mood and affect.  behavior is normal.   Lab Results  Component Value Date   CD4TCELL 23* 08/28/2015   Lab Results  Component Value Date   CD4TABS 300* 08/28/2015   CD4TABS 140* 03/06/2015   CD4TABS 270* 08/10/2014   Lab Results  Component Value Date   HIV1RNAQUANT 62* 08/28/2015   Lab Results  Component Value Date   HEPBSAB NEG 12/29/2013   No results found for: RPR  CBC Lab Results  Component Value Date   WBC 4.9 08/28/2015   RBC 4.36  08/28/2015   HGB 12.6 08/28/2015   HCT 38.2 08/28/2015   PLT 232 08/28/2015   MCV 87.6 08/28/2015   MCH 28.9 08/28/2015   MCHC 33.0 08/28/2015   RDW 14.9 08/28/2015   LYMPHSABS 1323 08/28/2015   MONOABS 343 08/28/2015   EOSABS 49 08/28/2015   BASOSABS 0 08/28/2015   BMET Lab Results  Component Value Date   NA 138 08/28/2015   K 3.5 08/28/2015   CL 103 08/28/2015   CO2 24 08/28/2015   GLUCOSE 96 08/28/2015   BUN 17 08/28/2015   CREATININE 1.26* 08/28/2015   CALCIUM 9.5 08/28/2015   GFRNONAA 69 03/06/2015   GFRAA 80 03/06/2015     Assessment and Plan  hiv = well controlled. Will continue on triumeq htn = not at goal, though she didn't take her meds this am. Will continue to follow  Situational bereavement = lost her mother in January, still coping with loss  Hx of chronic hep c = will do surveillance [place] at next visit  Health maintenance = will get csy now, mammo due in the fall

## 2015-09-13 ENCOUNTER — Other Ambulatory Visit: Payer: Self-pay

## 2015-09-13 DIAGNOSIS — Z1231 Encounter for screening mammogram for malignant neoplasm of breast: Secondary | ICD-10-CM

## 2015-09-14 ENCOUNTER — Ambulatory Visit: Payer: Commercial Managed Care - HMO

## 2015-09-28 ENCOUNTER — Other Ambulatory Visit (HOSPITAL_COMMUNITY)
Admission: RE | Admit: 2015-09-28 | Discharge: 2015-09-28 | Disposition: A | Discharge status: Home / Self Care | Payer: Commercial Managed Care - HMO | Source: Ambulatory Visit | Attending: Internal Medicine | Admitting: Internal Medicine

## 2015-09-28 ENCOUNTER — Ambulatory Visit (INDEPENDENT_AMBULATORY_CARE_PROVIDER_SITE_OTHER): Payer: Commercial Managed Care - HMO | Admitting: *Deleted

## 2015-09-28 ENCOUNTER — Other Ambulatory Visit: Payer: Self-pay | Admitting: *Deleted

## 2015-09-28 DIAGNOSIS — R8789 Other abnormal findings in specimens from female genital organs: Secondary | ICD-10-CM | POA: Diagnosis not present

## 2015-09-28 DIAGNOSIS — Z01411 Encounter for gynecological examination (general) (routine) with abnormal findings: Secondary | ICD-10-CM | POA: Insufficient documentation

## 2015-09-28 DIAGNOSIS — Z113 Encounter for screening for infections with a predominantly sexual mode of transmission: Secondary | ICD-10-CM

## 2015-09-28 DIAGNOSIS — G4700 Insomnia, unspecified: Secondary | ICD-10-CM

## 2015-09-28 DIAGNOSIS — R87612 Low grade squamous intraepithelial lesion on cytologic smear of cervix (LGSIL): Secondary | ICD-10-CM | POA: Diagnosis not present

## 2015-09-28 DIAGNOSIS — Z124 Encounter for screening for malignant neoplasm of cervix: Secondary | ICD-10-CM | POA: Diagnosis not present

## 2015-09-28 DIAGNOSIS — B977 Papillomavirus as the cause of diseases classified elsewhere: Secondary | ICD-10-CM | POA: Diagnosis not present

## 2015-09-28 DIAGNOSIS — IMO0002 Reserved for concepts with insufficient information to code with codable children: Secondary | ICD-10-CM

## 2015-09-28 MED ORDER — ZOLPIDEM TARTRATE 10 MG PO TABS: 10.0000 mg | ORAL_TABLET | Freq: Every evening | ORAL | Status: DC | PRN

## 2015-09-28 NOTE — Progress Notes (Signed)
  Subjective:     Vanessa Beard is a 65 y.o. woman who comes in today for a  pap smear only.  Previous abnormal Pap smears: yes. Contraception: condoms.  Pt doses not have any complaints of vaginal itching or discharge.  Objective:    There were no vitals taken for this visit. Pelvic Exam:  Pap smear obtained.  Joe, thick vaginal discharge present on inspection.  Assessment:    Screening pap smear.   Plan:    Follow up in one year, or as indicated by Pap results.   Pt given educational materials re: HIV and women, self-esteem, BSE, nutrition and diet management, PAP smears and partner safety. Pt given condoms

## 2015-09-28 NOTE — Patient Instructions (Signed)
Your results will be ready in about a week.  I will send them via MyChart.  Thank you for coming to the Center for your care.  Langley Gauss, RN

## 2015-09-28 NOTE — Telephone Encounter (Signed)
Verbal order from Dr. Baxter Flattery to refill Ambien rx +2Refills.

## 2015-10-01 ENCOUNTER — Ambulatory Visit
Admission: RE | Admit: 2015-10-01 | Discharge: 2015-10-01 | Disposition: A | Discharge status: Home / Self Care | Payer: Commercial Managed Care - HMO | Source: Ambulatory Visit

## 2015-10-01 DIAGNOSIS — Z1231 Encounter for screening mammogram for malignant neoplasm of breast: Secondary | ICD-10-CM

## 2015-10-01 LAB — CERVICOVAGINAL ANCILLARY ONLY
Chlamydia: NEGATIVE
Neisseria Gonorrhea: NEGATIVE

## 2015-10-01 LAB — CYTOLOGY - PAP

## 2015-10-03 ENCOUNTER — Encounter: Payer: Self-pay | Admitting: *Deleted

## 2015-10-03 NOTE — Addendum Note (Signed)
Addended by: Lorne Skeens D on: 10/03/2015 03:43 PM   Modules accepted: Orders

## 2015-10-29 ENCOUNTER — Other Ambulatory Visit: Payer: Self-pay | Admitting: *Deleted

## 2015-10-29 DIAGNOSIS — I10 Essential (primary) hypertension: Secondary | ICD-10-CM

## 2015-10-29 MED ORDER — AMLODIPINE BESYLATE 10 MG PO TABS: 10.0000 mg | ORAL_TABLET | Freq: Every day | ORAL | Status: DC

## 2015-11-08 ENCOUNTER — Ambulatory Visit: Payer: Self-pay | Admitting: Gynecology

## 2015-11-12 ENCOUNTER — Emergency Department (HOSPITAL_COMMUNITY)
Admission: EM | Admit: 2015-11-12 | Discharge: 2015-11-12 | Disposition: A | Discharge status: Home / Self Care | Payer: Commercial Managed Care - HMO | Attending: Emergency Medicine | Admitting: Emergency Medicine

## 2015-11-12 ENCOUNTER — Encounter (HOSPITAL_COMMUNITY): Payer: Self-pay | Admitting: Emergency Medicine

## 2015-11-12 ENCOUNTER — Other Ambulatory Visit: Payer: Self-pay

## 2015-11-12 ENCOUNTER — Emergency Department (HOSPITAL_COMMUNITY): Payer: Commercial Managed Care - HMO

## 2015-11-12 DIAGNOSIS — E213 Hyperparathyroidism, unspecified: Secondary | ICD-10-CM | POA: Diagnosis not present

## 2015-11-12 DIAGNOSIS — I10 Essential (primary) hypertension: Secondary | ICD-10-CM | POA: Insufficient documentation

## 2015-11-12 DIAGNOSIS — B20 Human immunodeficiency virus [HIV] disease: Secondary | ICD-10-CM | POA: Diagnosis not present

## 2015-11-12 DIAGNOSIS — Z79899 Other long term (current) drug therapy: Secondary | ICD-10-CM | POA: Insufficient documentation

## 2015-11-12 DIAGNOSIS — R0789 Other chest pain: Secondary | ICD-10-CM | POA: Diagnosis not present

## 2015-11-12 DIAGNOSIS — R079 Chest pain, unspecified: Secondary | ICD-10-CM | POA: Diagnosis not present

## 2015-11-12 MED ORDER — IBUPROFEN 600 MG PO TABS: 600.0000 mg | ORAL_TABLET | Freq: Four times a day (QID) | ORAL | Status: DC | PRN

## 2015-11-12 NOTE — ED Notes (Signed)
Pt c/o constant central aching chest pain onset Saturday after lifting boxes. Worsens with movement, laying on chest, or palpation. Present at rest.

## 2015-11-12 NOTE — ED Provider Notes (Signed)
CSN: [id number]     Arrival date & time 11/12/15  [id number] History   First MD Initiated Contact with Patient 11/12/15 1040     Chief Complaint  Patient presents with  . Chest Pain     (Consider location/radiation/quality/duration/timing/severity/associated sxs/prior Treatment) Patient is a 65 y.o. female presenting with chest pain. The history is provided by the patient.  Chest Pain Pain location:  R lateral chest Pain quality: aching   Pain radiates to:  Does not radiate Pain radiates to the back: no   Pain severity:  Moderate Onset quality:  Sudden Duration:  2 days Timing:  Constant Chronicity:  New Context: trauma   Worsened by:  Movement and certain positions Associated symptoms: no dizziness, no nausea and no palpitations   Risk factors: no aortic disease, no coronary artery disease, no diabetes mellitus, no Ehlers-Danlos syndrome, no hypertension, no Marfan's syndrome and no prior DVT/PE     Past Medical History  Diagnosis Date  . HIV infection ([place])   . Osteoporosis   . Gall bladder disease \  . Hypertension   . Insomnia   . Hepatitis C   . Biliary colic   . Allergic rhinitis   . Herpes   . Hyperparathyroidism ([place])   . Pneumonia     2010   Past Surgical History  Procedure Laterality Date  . Ectopic pregnancy surgery    . Shoulder surgery Right 12/2014  . Cholecystectomy N/A 01/09/2015    Procedure: LAPAROSCOPIC CHOLECYSTECTOMY;  Surgeon: [name], MD;  Location[facility] OR;  Service: General;  Laterality: N/A;   Family History  Problem Relation Age of Onset  . Diabetes Mother   . Hypertension Mother    Social History  Substance Use Topics  . Smoking status: Never Smoker   . Smokeless tobacco: Never Used  . Alcohol Use: 1.8 oz/week    3 Standard drinks or equivalent per week     Comment: wine or [place] alcohol.  Occasional   OB History    Gravida Para Term Preterm AB TAB SAB Ectopic Multiple Living   5 3 3  0 2 1 0 1 0 2     Review of Systems   Cardiovascular: Positive for chest pain. Negative for palpitations.  Gastrointestinal: Negative for nausea.  Skin: Negative for rash and wound.  Allergic/Immunologic: Positive for immunocompromised state.  Neurological: Negative for dizziness.      Allergies  Dapsone; Retrovir; and Sulfamethoxazole-trimethoprim  Home Medications   Prior to Admission medications   Medication Sig Start Date End Date Taking? Authorizing Provider  abacavir-dolutegravir-lamiVUDine (TRIUMEQ) 600-50-300 MG tablet Take 1 tablet by mouth daily. 05/17/15  Yes Carlyle Basques, MD  amLODipine (NORVASC) 10 MG tablet Take 1 tablet (10 mg total) by mouth daily. 10/29/15  Yes Carlyle Basques, MD  Multiple Vitamins-Minerals (ADULT GUMMY) CHEW Chew 3 capsules by mouth daily.   Yes Historical Provider, MD  zolpidem (AMBIEN) 10 MG tablet Take 1 tablet (10 mg total) by mouth at bedtime as needed for sleep. 09/28/15  Yes Carlyle Basques, MD  ibuprofen (ADVIL,MOTRIN) 600 MG tablet Take 1 tablet (600 mg total) by mouth every 6 (six) hours as needed. 11/12/15   Jordanne Elsbury, MD   BP 157/104 mmHg  Pulse 48  Temp(Src) 97.8 F (36.6 C) (Oral)  Resp 15  SpO2 100% Physical Exam  Constitutional: She is oriented to person, place, and time. She appears well-developed.  HENT:  Head: Normocephalic and atraumatic.  Eyes: EOM are normal.  Neck: Normal range of  motion. Neck supple.  Cardiovascular: Normal rate.   Pulmonary/Chest: Effort normal. No respiratory distress. She exhibits tenderness.  Chest wall tenderness on the R side is reproduced with palpation of the chest and with chest presses, forward flexion.  Abdominal: Bowel sounds are normal.  Neurological: She is alert and oriented to person, place, and time.  Skin: Skin is warm and dry.  Nursing note and vitals reviewed.   ED Course  Procedures (including critical care time) Labs Review Labs Reviewed - No data to display  Imaging Review Dg Chest 2 View  11/12/2015   CLINICAL DATA:  Chest pain EXAM: CHEST  2 VIEW COMPARISON:  11/19/2012 FINDINGS: Bilateral opacification that is new from 2014, most confluent at the left base where there is both angular and ill-defined morphology. Bandlike opacity in the left upper lobe suggesting scar. Possible cystic space at the left apex. Asymmetric subpleural reticular density along the right chest wall. Borderline hyperinflation, chronic. Normal heart size and stable aortic tortuosity. Stable appearance of the hila with no convincing adenopathy. L4 superior endplate fracture, history suggesting chronicity. No acute osseous finding. IMPRESSION: Bilateral lung opacity that has developed since 2014. Patient has history of pneumocystis pneumonia but pattern is not typical for this process. If acute infectious symptoms recommend treatment with 3-4 week chest x-ray follow-up. Otherwise, CT is recommended. Electronically Signed   By: Monte Fantasia M.D.   On: 11/12/2015 10:22   I have personally reviewed and evaluated these images and lab results as part of my medical decision-making.   EKG Interpretation   Date/Time:  Monday November 12 2015 09:41:08 EDT Ventricular Rate:  48 PR Interval:    QRS Duration: 76 QT Interval:  433 QTC Calculation: 387 R Axis:   27 Text Interpretation:  Sinus bradycardia Confirmed by Hazle Coca [id number]) on  11/12/2015 9:44:40 AM      MDM   Final diagnoses:  Chest wall pain    Pt comes in with chest wall pain. Pt's pain appears to be reproducible with palpation and with movements associated with pec muscles. Denies any cough, pleuritic component to the pain. Will start nsaids and advise ice/heat therapy. Work note provided as well.  Varney Biles, MD 11/12/15 1127

## 2015-11-12 NOTE — Discharge Instructions (Signed)

## 2015-11-29 ENCOUNTER — Ambulatory Visit: Payer: Self-pay | Admitting: Gynecology

## 2015-12-17 ENCOUNTER — Telehealth: Payer: Self-pay | Admitting: *Deleted

## 2015-12-17 NOTE — Telephone Encounter (Signed)
Patient walked into clinic, asked to be referred to an eye doctor who will take her medicaid. RN advised via front desk that all medicaid referrals need to come from the primary physician, not a specialist.  Patient was instructed to contact DSS or call [phone] for a list of providers. Per Amy at [facility], Medicaid no longer covers eye exams for adults. Landis Gandy, RN

## 2015-12-24 ENCOUNTER — Encounter: Payer: Self-pay | Admitting: Obstetrics and Gynecology

## 2015-12-24 ENCOUNTER — Ambulatory Visit (INDEPENDENT_AMBULATORY_CARE_PROVIDER_SITE_OTHER): Payer: Commercial Managed Care - HMO | Admitting: Obstetrics and Gynecology

## 2015-12-24 VITALS — Resp 14 | Ht 60.0 in | Wt 143.0 lb

## 2015-12-24 DIAGNOSIS — R87612 Low grade squamous intraepithelial lesion on cytologic smear of cervix (LGSIL): Secondary | ICD-10-CM

## 2015-12-24 NOTE — Progress Notes (Signed)
65yo HIV with LGSIL on 09/2015 pap smear Patient given informed consent, signed copy in the chart, time out was performed.  Placed in lithotomy position. Cervix viewed with speculum and colposcope after application of acetic acid.   Colposcopy adequate?  No TZ not visualized Acetowhite lesions? Yes at 2 o'clock Punctation? no Mosaicism?  no Abnormal vasculature?  no Biopsies? cervical biopsy at 2 o'clock ECC? yes  COMMENTS:  Patient was given post procedure instructions.  She will return in 2 weeks for results.  Mora Bellman, MD

## 2015-12-28 ENCOUNTER — Telehealth: Payer: Self-pay

## 2015-12-28 NOTE — Telephone Encounter (Signed)
Patient called wanting to know what was the date of her last flu shot. 03/06/2015. Patient stated she was at the pharmacy and was going to get her flu shot while there because they were giving them out. Rodman Key, LPN

## 2016-01-01 ENCOUNTER — Telehealth: Payer: Self-pay | Admitting: *Deleted

## 2016-01-01 NOTE — Telephone Encounter (Signed)
Patient notified

## 2016-01-01 NOTE — Telephone Encounter (Signed)
Patient is calling for her biopsy results

## 2016-01-01 NOTE — Telephone Encounter (Signed)
-----   Message from Mora Bellman, MD sent at 01/01/2016  4:36 PM EDT ----- Please inform the patient of normal colposcopy and plan to repeat pap smear next year.  Thanks  [employer]

## 2016-01-23 ENCOUNTER — Other Ambulatory Visit: Payer: Self-pay

## 2016-01-25 ENCOUNTER — Other Ambulatory Visit: Payer: Self-pay | Admitting: Internal Medicine

## 2016-01-25 DIAGNOSIS — G4700 Insomnia, unspecified: Secondary | ICD-10-CM

## 2016-01-29 ENCOUNTER — Other Ambulatory Visit: Payer: Self-pay

## 2016-01-29 DIAGNOSIS — G4700 Insomnia, unspecified: Secondary | ICD-10-CM

## 2016-01-29 MED ORDER — ZOLPIDEM TARTRATE 10 MG PO TABS: 10.0000 mg | ORAL_TABLET | Freq: Every evening | ORAL | 2 refills | Status: DC | PRN

## 2016-01-29 NOTE — Telephone Encounter (Signed)
Medication called to pharmacy.  Initially it was sent electronic by error.   Laverle Patter, RN

## 2016-02-01 DIAGNOSIS — B20 Human immunodeficiency virus [HIV] disease: Secondary | ICD-10-CM | POA: Diagnosis not present

## 2016-02-01 DIAGNOSIS — Z23 Encounter for immunization: Secondary | ICD-10-CM | POA: Diagnosis not present

## 2016-02-01 DIAGNOSIS — I10 Essential (primary) hypertension: Secondary | ICD-10-CM | POA: Diagnosis not present

## 2016-02-01 DIAGNOSIS — M810 Age-related osteoporosis without current pathological fracture: Secondary | ICD-10-CM | POA: Diagnosis not present

## 2016-02-01 DIAGNOSIS — D472 Monoclonal gammopathy: Secondary | ICD-10-CM | POA: Diagnosis not present

## 2016-03-19 ENCOUNTER — Ambulatory Visit: Payer: Commercial Managed Care - HMO | Admitting: Internal Medicine

## 2016-03-27 ENCOUNTER — Ambulatory Visit (INDEPENDENT_AMBULATORY_CARE_PROVIDER_SITE_OTHER): Payer: Commercial Managed Care - HMO | Admitting: Internal Medicine

## 2016-03-27 ENCOUNTER — Encounter: Payer: Self-pay | Admitting: Internal Medicine

## 2016-03-27 VITALS — BP 144/90 | HR 68 | Temp 97.6°F | Wt 152.0 lb

## 2016-03-27 DIAGNOSIS — I10 Essential (primary) hypertension: Secondary | ICD-10-CM | POA: Diagnosis not present

## 2016-03-27 DIAGNOSIS — B20 Human immunodeficiency virus [HIV] disease: Secondary | ICD-10-CM | POA: Diagnosis not present

## 2016-03-27 DIAGNOSIS — B182 Chronic viral hepatitis C: Secondary | ICD-10-CM | POA: Diagnosis not present

## 2016-03-27 LAB — CBC WITH DIFFERENTIAL/PLATELET
Basophils Absolute: 42 cells/uL (ref 0–200)
Basophils Relative: 1 %
Eosinophils Absolute: 84 cells/uL (ref 15–500)
Eosinophils Relative: 2 %
HCT: 39.8 % (ref 35.0–45.0)
Hemoglobin: 13 g/dL (ref 11.7–15.5)
Lymphocytes Relative: 27 %
Lymphs Abs: 1134 cells/uL (ref 850–3900)
MCH: 29.9 pg (ref 27.0–33.0)
MCHC: 32.7 g/dL (ref 32.0–36.0)
MCV: 91.5 fL (ref 80.0–100.0)
MPV: 11 fL (ref 7.5–12.5)
Monocytes Absolute: 294 cells/uL (ref 200–950)
Monocytes Relative: 7 %
Neutro Abs: 2646 cells/uL (ref 1500–7800)
Neutrophils Relative %: 63 %
Platelets: 217 10*3/uL (ref 140–400)
RBC: 4.35 MIL/uL (ref 3.80–5.10)
RDW: 14.4 % (ref 11.0–15.0)
WBC: 4.2 10*3/uL (ref 3.8–10.8)

## 2016-03-27 NOTE — Progress Notes (Signed)
Patient ID: Vanessa Beard, female   DOB: Oct 05, 1950, 65 y.o.   MRN: [id number]  HPI CD 4 count 300/VL 62.(april 2017) currently on triumeq for her well controlled hiv disease. She denies missing any doses. She had returned from a cruise recently. She states that she has been recovering from the death of her mother since summer.  Outpatient Encounter Prescriptions as of 03/27/2016  Medication Sig  . abacavir-dolutegravir-lamiVUDine (TRIUMEQ) 600-50-300 MG tablet Take 1 tablet by mouth daily.  Marland Kitchen amLODipine (NORVASC) 10 MG tablet Take 1 tablet (10 mg total) by mouth daily.  Marland Kitchen ibuprofen (ADVIL,MOTRIN) 600 MG tablet Take 1 tablet (600 mg total) by mouth every 6 (six) hours as needed.  . meloxicam (MOBIC) 15 MG tablet Take 15 mg by mouth daily.  . Multiple Vitamins-Minerals (ADULT GUMMY) CHEW Chew 3 capsules by mouth daily.  Marland Kitchen zolpidem (AMBIEN) 10 MG tablet Take 1 tablet (10 mg total) by mouth at bedtime as needed for sleep.   No facility-administered encounter medications on file as of 03/27/2016.      Patient Active Problem List   Diagnosis Date Noted  . Screening examination for venereal disease 03/06/2015  . Encounter for long-term (current) use of medications 03/06/2015  . Elevated LFTs 12/11/2014  . Abnormal ultrasound 12/11/2014  . Abdominal pain, epigastric 12/11/2014  . ASCUS with positive high risk HPV 10/31/2013  . Gall bladder disease 02/03/2013  . 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  . Compression fracture of L3 lumbar vertebra ([place]) 07/01/2011  . Thyroid dysfunction 07/01/2011  . Anemia 07/01/2011  . Sore throat 04/02/2011  . Hyperparathyroidism 02/10/2011  . Osteopenia 02/03/2011  . PAP SMEAR, LGSIL, ABNORMAL 12/21/2009  . COUGH 07/18/2009  . Chronic hepatitis C virus infection ([place]) 12/19/2008  . LEUKORRHEA 04/07/2008  . FATIGUE 04/07/2008  . BACK PAIN, LUMBAR 11/10/2007  . ALLERGIC RHINITIS 07/09/2007  . PNEUMOCYSTIS  PNEUMONIA 05/07/2007  . HYPERTENSION 05/07/2007  . DENTAL CARIES 05/07/2007  . INSOMNIA 05/07/2007  . PNEUMONIA, HX OF 05/07/2007  . HERPES ZOSTER, HX OF 05/07/2007  . Human immunodeficiency virus (HIV) disease 08/04/2006     Health Maintenance Due  Topic Date Due  . TETANUS/TDAP  03/07/1968  . COLONOSCOPY  03/08/1999  . ZOSTAVAX  03/07/2009  . PNA vac Low Risk Adult (1 of 2 - PCV13) 03/07/2014  . INFLUENZA VACCINE  12/18/2015     Review of Systems 10 point ros is negative Physical Exam   BP (!) 144/90   Pulse 68   Temp 97.6 F (36.4 C) (Oral)   Wt 152 lb (68.9 kg)   BMI 29.69 kg/m  Physical Exam  Constitutional:  oriented to person, place, and time. appears well-developed and well-nourished. No distress.  HENT: [place]/AT, PERRLA, no scleral icterus Mouth/Throat: Oropharynx is clear and moist. No oropharyngeal exudate.  Cardiovascular: Normal rate, regular rhythm and normal heart sounds. Exam reveals no gallop and no friction rub.  No murmur heard.  Pulmonary/Chest: Effort normal and breath sounds normal. No respiratory distress.  has no wheezes.  Neck = supple, no nuchal rigidity Abdominal: Soft. Bowel sounds are normal.  exhibits no distension. There is no tenderness.  Lymphadenopathy: no cervical adenopathy. No axillary adenopathy Neurological: alert and oriented to person, place, and time.  Skin: Skin is warm and dry. No rash noted. No erythema.  Psychiatric: a normal mood and affect.  behavior is normal.   Lab Results  Component Value Date   CD4TCELL 23 (L) 08/28/2015   Lab Results  Component Value Date   CD4TABS 300 (L) 08/28/2015   CD4TABS 140 (L) 03/06/2015   CD4TABS 270 (L) 08/10/2014   Lab Results  Component Value Date   HIV1RNAQUANT 62 (H) 08/28/2015   Lab Results  Component Value Date   HEPBSAB NEG 12/29/2013   No results found for: RPR  CBC Lab Results  Component Value Date   WBC 4.9 08/28/2015   RBC 4.36 08/28/2015   HGB 12.6 08/28/2015     HCT 38.2 08/28/2015   PLT 232 08/28/2015   MCV 87.6 08/28/2015   MCH 28.9 08/28/2015   MCHC 33.0 08/28/2015   RDW 14.9 08/28/2015   LYMPHSABS 1,323 08/28/2015   MONOABS 343 08/28/2015   EOSABS 49 08/28/2015   BASOSABS 0 08/28/2015   BMET Lab Results  Component Value Date   NA 138 08/28/2015   K 3.5 08/28/2015   CL 103 08/28/2015   CO2 24 08/28/2015   GLUCOSE 96 08/28/2015   BUN 17 08/28/2015   CREATININE 1.26 (H) 08/28/2015   CALCIUM 9.5 08/28/2015   GFRNONAA 69 03/06/2015   GFRAA 80 03/06/2015     Assessment and Plan  Chronic Hep c without hepatic coma = needs u/s for follow upon hcc surveillance  hiv = will check labs, had low lever viremia.  healht maintenance = will give flu shot vaccine  htn = will see if at next visit, if still elevated -then need to change her regimen

## 2016-03-28 LAB — COMPLETE METABOLIC PANEL WITH GFR
ALT: 10 U/L (ref 6–29)
AST: 22 U/L (ref 10–35)
Albumin: 3.6 g/dL (ref 3.6–5.1)
Alkaline Phosphatase: 93 U/L (ref 33–130)
BUN: 14 mg/dL (ref 7–25)
CO2: 23 mmol/L (ref 20–31)
Calcium: 9.5 mg/dL (ref 8.6–10.4)
Chloride: 106 mmol/L (ref 98–110)
Creat: 0.87 mg/dL (ref 0.50–0.99)
GFR, Est African American: 80 mL/min (ref >=60)
GFR, Est Non African American: 69 mL/min (ref >=60)
Glucose, Bld: 92 mg/dL (ref 65–99)
Potassium: 3.9 mmol/L (ref 3.5–5.3)
Sodium: 139 mmol/L (ref 135–146)
Total Bilirubin: 0.5 mg/dL (ref 0.2–1.2)
Total Protein: 8.1 g/dL (ref 6.1–8.1)

## 2016-03-28 LAB — RPR

## 2016-03-28 LAB — T-HELPER CELL (CD4) - (RCID CLINIC ONLY)
CD4 % Helper T Cell: 18 % — ABNORMAL LOW (ref 33–55)
CD4 T Cell Abs: 220 /uL — ABNORMAL LOW (ref 400–2700)

## 2016-03-31 LAB — HIV-1 RNA QUANT-NO REFLEX-BLD
HIV 1 RNA Quant: <20 copies/mL (ref <20)
HIV-1 RNA Quant, Log: <1.3 Log copies/mL (ref <1.30)

## 2016-04-19 ENCOUNTER — Other Ambulatory Visit: Payer: Self-pay | Admitting: Internal Medicine

## 2016-04-19 DIAGNOSIS — B20 Human immunodeficiency virus [HIV] disease: Secondary | ICD-10-CM

## 2016-05-05 ENCOUNTER — Ambulatory Visit (HOSPITAL_COMMUNITY)
Admission: RE | Admit: 2016-05-05 | Discharge: 2016-05-05 | Disposition: A | Discharge status: Home / Self Care | Payer: Commercial Managed Care - HMO | Source: Ambulatory Visit | Attending: Internal Medicine | Admitting: Internal Medicine

## 2016-05-05 DIAGNOSIS — Z9049 Acquired absence of other specified parts of digestive tract: Secondary | ICD-10-CM | POA: Diagnosis not present

## 2016-05-05 DIAGNOSIS — Z8619 Personal history of other infectious and parasitic diseases: Secondary | ICD-10-CM | POA: Diagnosis not present

## 2016-05-05 DIAGNOSIS — B182 Chronic viral hepatitis C: Secondary | ICD-10-CM | POA: Insufficient documentation

## 2016-05-06 ENCOUNTER — Ambulatory Visit
Admission: RE | Admit: 2016-05-06 | Discharge: 2016-05-06 | Disposition: A | Discharge status: Home / Self Care | Payer: Commercial Managed Care - HMO | Source: Ambulatory Visit | Attending: Internal Medicine | Admitting: Internal Medicine

## 2016-05-06 ENCOUNTER — Other Ambulatory Visit: Payer: Self-pay | Admitting: Internal Medicine

## 2016-05-06 DIAGNOSIS — M545 Low back pain, unspecified: Secondary | ICD-10-CM

## 2016-06-02 ENCOUNTER — Other Ambulatory Visit: Payer: Self-pay | Admitting: Internal Medicine

## 2016-06-02 DIAGNOSIS — G4700 Insomnia, unspecified: Secondary | ICD-10-CM

## 2016-07-03 ENCOUNTER — Ambulatory Visit: Payer: Commercial Managed Care - HMO | Admitting: Internal Medicine

## 2016-07-07 ENCOUNTER — Ambulatory Visit: Payer: Commercial Managed Care - HMO | Admitting: Internal Medicine

## 2016-07-29 ENCOUNTER — Other Ambulatory Visit: Payer: Medicare HMO

## 2016-07-29 ENCOUNTER — Other Ambulatory Visit: Payer: Self-pay | Admitting: *Deleted

## 2016-07-29 ENCOUNTER — Ambulatory Visit: Payer: Medicare HMO | Admitting: Internal Medicine

## 2016-07-29 DIAGNOSIS — B20 Human immunodeficiency virus [HIV] disease: Secondary | ICD-10-CM | POA: Diagnosis not present

## 2016-07-29 LAB — CBC WITH DIFFERENTIAL/PLATELET
Basophils Absolute: 40 cells/uL (ref 0–200)
Basophils Relative: 1 %
Eosinophils Absolute: 80 cells/uL (ref 15–500)
Eosinophils Relative: 2 %
HCT: 38.8 % (ref 35.0–45.0)
Hemoglobin: 12.8 g/dL (ref 11.7–15.5)
Lymphocytes Relative: 33 %
Lymphs Abs: 1320 cells/uL (ref 850–3900)
MCH: 29.6 pg (ref 27.0–33.0)
MCHC: 33 g/dL (ref 32.0–36.0)
MCV: 89.8 fL (ref 80.0–100.0)
MPV: 11.6 fL (ref 7.5–12.5)
Monocytes Absolute: 280 cells/uL (ref 200–950)
Monocytes Relative: 7 %
Neutro Abs: 2280 cells/uL (ref 1500–7800)
Neutrophils Relative %: 57 %
Platelets: 195 10*3/uL (ref 140–400)
RBC: 4.32 MIL/uL (ref 3.80–5.10)
RDW: 14.3 % (ref 11.0–15.0)
WBC: 4 10*3/uL (ref 3.8–10.8)

## 2016-07-29 LAB — COMPLETE METABOLIC PANEL WITH GFR
ALT: 6 U/L (ref 6–29)
AST: 18 U/L (ref 10–35)
Albumin: 3.8 g/dL (ref 3.6–5.1)
Alkaline Phosphatase: 86 U/L (ref 33–130)
BUN: 13 mg/dL (ref 7–25)
CO2: 27 mmol/L (ref 20–31)
Calcium: 9.2 mg/dL (ref 8.6–10.4)
Chloride: 105 mmol/L (ref 98–110)
Creat: 1 mg/dL — ABNORMAL HIGH (ref 0.50–0.99)
GFR, Est African American: 67 mL/min (ref >=60)
GFR, Est Non African American: 58 mL/min — ABNORMAL LOW (ref >=60)
Glucose, Bld: 89 mg/dL (ref 65–99)
Potassium: 3.7 mmol/L (ref 3.5–5.3)
Sodium: 139 mmol/L (ref 135–146)
Total Bilirubin: 0.6 mg/dL (ref 0.2–1.2)
Total Protein: 8.1 g/dL (ref 6.1–8.1)

## 2016-07-29 NOTE — Progress Notes (Deleted)
Patient ID: Vanessa Beard, female   DOB: [birth year], 66 y.o.   MRN: [id number]  HPI 66yo F with hiv disease, well controlled. CD 4 count of 220/VL<20, on triumeq. Seen in November. Doing well though we  Were concerned about her blood pressure. Hx of hep C treated  Outpatient Encounter Prescriptions as of 07/29/2016  Medication Sig  . amLODipine (NORVASC) 10 MG tablet Take 1 tablet (10 mg total) by mouth daily.  Marland Kitchen ibuprofen (ADVIL,MOTRIN) 600 MG tablet Take 1 tablet (600 mg total) by mouth every 6 (six) hours as needed.  . meloxicam (MOBIC) 15 MG tablet Take 15 mg by mouth daily.  . Multiple Vitamins-Minerals (ADULT GUMMY) CHEW Chew 3 capsules by mouth daily.  . TRIUMEQ 600-50-300 MG tablet TAKE 1 TABLET BY MOUTH DAILY  . zolpidem (AMBIEN) 10 MG tablet take 1 tablet by mouth at bedtime if needed   No facility-administered encounter medications on file as of 07/29/2016.      Patient Active Problem List   Diagnosis Date Noted  . Screening examination for venereal disease 03/06/2015  . Encounter for long-term (current) use of medications 03/06/2015  . Elevated LFTs 12/11/2014  . Abnormal ultrasound 12/11/2014  . Abdominal pain, epigastric 12/11/2014  . ASCUS with positive high risk HPV 10/31/2013  . Gall bladder disease 02/03/2013  . 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  . Compression fracture of L3 lumbar vertebra ([place]) 07/01/2011  . Thyroid dysfunction 07/01/2011  . Anemia 07/01/2011  . Sore throat 04/02/2011  . Hyperparathyroidism 02/10/2011  . Osteopenia 02/03/2011  . PAP SMEAR, LGSIL, ABNORMAL 12/21/2009  . COUGH 07/18/2009  . Chronic hepatitis C virus infection ([place]) 12/19/2008  . LEUKORRHEA 04/07/2008  . FATIGUE 04/07/2008  . BACK PAIN, LUMBAR 11/10/2007  . ALLERGIC RHINITIS 07/09/2007  . PNEUMOCYSTIS PNEUMONIA 05/07/2007  . HYPERTENSION 05/07/2007  . DENTAL CARIES 05/07/2007  . INSOMNIA 05/07/2007  . PNEUMONIA, HX OF 05/07/2007   . HERPES ZOSTER, HX OF 05/07/2007  . Human immunodeficiency virus (HIV) disease 08/04/2006     Health Maintenance Due  Topic Date Due  . TETANUS/TDAP  03/07/1968  . COLONOSCOPY  03/08/1999  . PNA vac Low Risk Adult (1 of 2 - PCV13) 03/07/2014  . INFLUENZA VACCINE  12/18/2015     Review of Systems  Physical Exam   There were no vitals taken for this visit.   Lab Results  Component Value Date   CD4TCELL 18 (L) 03/27/2016   Lab Results  Component Value Date   CD4TABS 220 (L) 03/27/2016   CD4TABS 300 (L) 08/28/2015   CD4TABS 140 (L) 03/06/2015   Lab Results  Component Value Date   HIV1RNAQUANT <20 03/27/2016   Lab Results  Component Value Date   HEPBSAB NEG 12/29/2013   Lab Results  Component Value Date   LABRPR NON REAC 03/27/2016    CBC Lab Results  Component Value Date   WBC 4.2 03/27/2016   RBC 4.35 03/27/2016   HGB 13.0 03/27/2016   HCT 39.8 03/27/2016   PLT 217 03/27/2016   MCV 91.5 03/27/2016   MCH 29.9 03/27/2016   MCHC 32.7 03/27/2016   RDW 14.4 03/27/2016   LYMPHSABS 1,134 03/27/2016   MONOABS 294 03/27/2016   EOSABS 84 03/27/2016    BMET Lab Results  Component Value Date   NA 139 03/27/2016   K 3.9 03/27/2016   CL 106 03/27/2016   CO2 23 03/27/2016   GLUCOSE 92 03/27/2016   BUN 14 03/27/2016   CREATININE  0.87 03/27/2016   CALCIUM 9.5 03/27/2016   GFRNONAA 69 03/27/2016   GFRAA 80 03/27/2016      Assessment and Plan  hiv disease= get labs today. Well controlled  Chronic hep C without hepatic coma = will get limited US for [facility] surveillance  htn =

## 2016-07-30 LAB — T-HELPER CELL (CD4) - (RCID CLINIC ONLY)
CD4 % Helper T Cell: 18 % — ABNORMAL LOW (ref 33–55)
CD4 T Cell Abs: 270 /uL — ABNORMAL LOW (ref 400–2700)

## 2016-07-31 DIAGNOSIS — M810 Age-related osteoporosis without current pathological fracture: Secondary | ICD-10-CM | POA: Diagnosis not present

## 2016-07-31 DIAGNOSIS — R5383 Other fatigue: Secondary | ICD-10-CM | POA: Diagnosis not present

## 2016-07-31 DIAGNOSIS — I10 Essential (primary) hypertension: Secondary | ICD-10-CM | POA: Diagnosis not present

## 2016-07-31 LAB — HIV-1 RNA QUANT-NO REFLEX-BLD
HIV 1 RNA Quant: <20 copies/mL
HIV-1 RNA Quant, Log: <1.3 Log copies/mL

## 2016-08-13 ENCOUNTER — Ambulatory Visit (INDEPENDENT_AMBULATORY_CARE_PROVIDER_SITE_OTHER): Payer: Medicare HMO | Admitting: Internal Medicine

## 2016-08-13 VITALS — BP 139/96 | HR 83 | Temp 98.2°F | Ht 60.0 in | Wt 148.0 lb

## 2016-08-13 DIAGNOSIS — I10 Essential (primary) hypertension: Secondary | ICD-10-CM | POA: Diagnosis not present

## 2016-08-13 DIAGNOSIS — B20 Human immunodeficiency virus [HIV] disease: Secondary | ICD-10-CM | POA: Diagnosis not present

## 2016-08-13 DIAGNOSIS — Z23 Encounter for immunization: Secondary | ICD-10-CM | POA: Diagnosis not present

## 2016-08-13 NOTE — Progress Notes (Signed)
RFV: hiv disease follow up  Patient ID: Vanessa Beard, female   DOB: [birth year], 66 y.o.   MRN: [id number]  HPI 270/VL<20, (march 2018) on triumeq.she reports doing well. She is back to school but did poorly in her algebra class. She reports her health is good otherwise  Outpatient Encounter Prescriptions as of 08/13/2016  Medication Sig  . amLODipine (NORVASC) 10 MG tablet Take 1 tablet (10 mg total) by mouth daily.  Marland Kitchen ibuprofen (ADVIL,MOTRIN) 600 MG tablet Take 1 tablet (600 mg total) by mouth every 6 (six) hours as needed.  . meloxicam (MOBIC) 15 MG tablet Take 15 mg by mouth daily.  . Multiple Vitamins-Minerals (ADULT GUMMY) CHEW Chew 3 capsules by mouth daily.  . TRIUMEQ 600-50-300 MG tablet TAKE 1 TABLET BY MOUTH DAILY  . zolpidem (AMBIEN) 10 MG tablet take 1 tablet by mouth at bedtime if needed   No facility-administered encounter medications on file as of 08/13/2016.      Patient Active Problem List   Diagnosis Date Noted  . Screening examination for venereal disease 03/06/2015  . Encounter for long-term (current) use of medications 03/06/2015  . Elevated LFTs 12/11/2014  . Abnormal ultrasound 12/11/2014  . Abdominal pain, epigastric 12/11/2014  . ASCUS with positive high risk HPV 10/31/2013  . Gall bladder disease 02/03/2013  . 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  . Compression fracture of L3 lumbar vertebra ([place]) 07/01/2011  . Thyroid dysfunction 07/01/2011  . Anemia 07/01/2011  . Sore throat 04/02/2011  . Hyperparathyroidism 02/10/2011  . Osteopenia 02/03/2011  . PAP SMEAR, LGSIL, ABNORMAL 12/21/2009  . COUGH 07/18/2009  . Chronic hepatitis C virus infection ([place]) 12/19/2008  . LEUKORRHEA 04/07/2008  . FATIGUE 04/07/2008  . BACK PAIN, LUMBAR 11/10/2007  . ALLERGIC RHINITIS 07/09/2007  . PNEUMOCYSTIS PNEUMONIA 05/07/2007  . HYPERTENSION 05/07/2007  . DENTAL CARIES 05/07/2007  . INSOMNIA 05/07/2007  . PNEUMONIA, HX OF  05/07/2007  . HERPES ZOSTER, HX OF 05/07/2007  . Human immunodeficiency virus (HIV) disease 08/04/2006     Health Maintenance Due  Topic Date Due  . TETANUS/TDAP  03/07/1968  . COLONOSCOPY  03/08/1999  . PNA vac Low Risk Adult (1 of 2 - PCV13) 03/07/2014  . INFLUENZA VACCINE  12/18/2015     Review of Systems Review of Systems  Constitutional: Negative for fever, chills, diaphoresis, activity change, appetite change, fatigue and unexpected weight change.  HENT: Negative for congestion, sore throat, rhinorrhea, sneezing, trouble swallowing and sinus pressure.  Eyes: Negative for photophobia and visual disturbance.  Respiratory: Negative for cough, chest tightness, shortness of breath, wheezing and stridor.  Cardiovascular: Negative for chest pain, palpitations and leg swelling.  Gastrointestinal: Negative for nausea, vomiting, abdominal pain, diarrhea, constipation, blood in stool, abdominal distention and anal bleeding.  Genitourinary: Negative for dysuria, hematuria, flank pain and difficulty urinating.  Musculoskeletal: Negative for myalgias, back pain, joint swelling, arthralgias and gait problem.  Skin: Negative for color change, pallor, rash and wound.  Neurological: Negative for dizziness, tremors, weakness and light-headedness.  Hematological: Negative for adenopathy. Does not bruise/bleed easily.  Psychiatric/Behavioral: Negative for behavioral problems, confusion, sleep disturbance, dysphoric mood, decreased concentration and agitation.   Social History  Substance Use Topics  . Smoking status: Never Smoker  . Smokeless tobacco: Never Used  . Alcohol use 1.8 oz/week    3 Standard drinks or equivalent per week     Comment: wine or Needs alcohol.  Occasional    Physical Exam   BP (!) 139/96  Pulse 83   Temp 98.2 F (36.8 C) (Oral)   Ht 5' (1.524 m)   Wt 148 lb (67.1 kg)   BMI 28.90 kg/m   Physical Exam  Constitutional:  oriented to person, place, and time.  appears well-developed and well-nourished. No distress.  HENT: /AT, PERRLA, no scleral icterus Mouth/Throat: Oropharynx is clear and moist. No oropharyngeal exudate.  Cardiovascular: Normal rate, regular rhythm and normal heart sounds. Exam reveals no gallop and no friction rub.  No murmur heard.  Pulmonary/Chest: Effort normal and breath sounds normal. No respiratory distress.  has no wheezes.  Neck = supple, no nuchal rigidity Abdominal: Soft. Bowel sounds are normal.  exhibits no distension. There is no tenderness.  Lymphadenopathy: no cervical adenopathy. No axillary adenopathy Neurological: alert and oriented to person, place, and time.  Skin: Skin is warm and dry. No rash noted. No erythema.  Psychiatric: a normal mood and affect.  behavior is normal.   Lab Results  Component Value Date   CD4TCELL 18 (L) 07/29/2016   Lab Results  Component Value Date   CD4TABS 270 (L) 07/29/2016   CD4TABS 220 (L) 03/27/2016   CD4TABS 300 (L) 08/28/2015   Lab Results  Component Value Date   HIV1RNAQUANT <20 NOT DETECTED 07/29/2016   Lab Results  Component Value Date   HEPBSAB NEG 12/29/2013   Lab Results  Component Value Date   LABRPR NON REAC 03/27/2016    CBC Lab Results  Component Value Date   WBC 4.0 07/29/2016   RBC 4.32 07/29/2016   HGB 12.8 07/29/2016   HCT 38.8 07/29/2016   PLT 195 07/29/2016   MCV 89.8 07/29/2016   MCH 29.6 07/29/2016   MCHC 33.0 07/29/2016   RDW 14.3 07/29/2016   LYMPHSABS 1,320 07/29/2016   MONOABS 280 07/29/2016   EOSABS 80 07/29/2016    BMET Lab Results  Component Value Date   NA 139 07/29/2016   K 3.7 07/29/2016   CL 105 07/29/2016   CO2 27 07/29/2016   GLUCOSE 89 07/29/2016   BUN 13 07/29/2016   CREATININE 1.00 (H) 07/29/2016   CALCIUM 9.2 07/29/2016   GFRNONAA 58 (L) 07/29/2016   GFRAA 67 07/29/2016      Assessment and Plan  hiv disease= well controlled, continue on current regimen  htn = slightly above goal. If elevated  at next visit, may need to add to her regimen  Health maintenance = will give pcv 13 sometime this year  mammo and pap = due in june

## 2016-08-26 ENCOUNTER — Other Ambulatory Visit: Payer: Self-pay | Admitting: Internal Medicine

## 2016-08-26 DIAGNOSIS — Z1231 Encounter for screening mammogram for malignant neoplasm of breast: Secondary | ICD-10-CM

## 2016-09-03 DIAGNOSIS — N3000 Acute cystitis without hematuria: Secondary | ICD-10-CM | POA: Diagnosis not present

## 2016-09-03 DIAGNOSIS — R300 Dysuria: Secondary | ICD-10-CM | POA: Diagnosis not present

## 2016-09-29 ENCOUNTER — Other Ambulatory Visit: Payer: Self-pay | Admitting: Internal Medicine

## 2016-09-29 DIAGNOSIS — G4700 Insomnia, unspecified: Secondary | ICD-10-CM

## 2016-09-30 ENCOUNTER — Other Ambulatory Visit: Payer: Self-pay | Admitting: Internal Medicine

## 2016-09-30 DIAGNOSIS — G4700 Insomnia, unspecified: Secondary | ICD-10-CM

## 2016-10-01 ENCOUNTER — Encounter: Payer: Self-pay | Admitting: Gynecology

## 2016-10-01 ENCOUNTER — Ambulatory Visit: Payer: Medicare HMO

## 2016-10-10 ENCOUNTER — Ambulatory Visit
Admission: RE | Admit: 2016-10-10 | Discharge: 2016-10-10 | Disposition: A | Discharge status: Home / Self Care | Payer: Medicare HMO | Source: Ambulatory Visit | Attending: Internal Medicine | Admitting: Internal Medicine

## 2016-10-10 DIAGNOSIS — Z1231 Encounter for screening mammogram for malignant neoplasm of breast: Secondary | ICD-10-CM

## 2016-11-07 ENCOUNTER — Telehealth: Payer: Self-pay | Admitting: *Deleted

## 2016-11-07 ENCOUNTER — Ambulatory Visit: Payer: Medicare HMO

## 2016-11-07 NOTE — Telephone Encounter (Signed)
Needs to reschedule PAP smear appt for July.  PAP smear schedule for July not in EPIC yet.  Advised patient to call the end of next week.

## 2016-11-28 ENCOUNTER — Other Ambulatory Visit (HOSPITAL_COMMUNITY)
Admission: RE | Admit: 2016-11-28 | Discharge: 2016-11-28 | Disposition: A | Discharge status: Home / Self Care | Payer: Medicare HMO | Source: Ambulatory Visit | Attending: Internal Medicine | Admitting: Internal Medicine

## 2016-11-28 ENCOUNTER — Ambulatory Visit (INDEPENDENT_AMBULATORY_CARE_PROVIDER_SITE_OTHER): Payer: Medicare HMO | Admitting: *Deleted

## 2016-11-28 DIAGNOSIS — N870 Mild cervical dysplasia: Secondary | ICD-10-CM | POA: Diagnosis not present

## 2016-11-28 DIAGNOSIS — Z124 Encounter for screening for malignant neoplasm of cervix: Secondary | ICD-10-CM

## 2016-11-28 DIAGNOSIS — Z113 Encounter for screening for infections with a predominantly sexual mode of transmission: Secondary | ICD-10-CM | POA: Diagnosis not present

## 2016-11-28 NOTE — Patient Instructions (Signed)
Your results will be ready in about a week.  Please look them up in Mychart.  Thank you for coming to see me today, Langley Gauss, Therapist, sports.

## 2016-11-28 NOTE — Progress Notes (Signed)
Subjective:     Vanessa Beard is a 66 y.o. woman who comes in today for a  pap smear only.  Previous abnormal Pap smear:  no. Contraception: condoms   Objective:    There were no vitals taken for this visit. Pelvic Exam:  Pap smear obtained.   Assessment:    Screening pap smear.   Plan:    Follow up in one year, or as indicated by Pap results.  Patient given Condomsl

## 2016-12-01 LAB — CERVICOVAGINAL ANCILLARY ONLY
Chlamydia: NEGATIVE
Neisseria Gonorrhea: NEGATIVE

## 2016-12-03 LAB — CYTOLOGY - PAP
Diagnosis: UNDETERMINED — AB
HPV: NOT DETECTED

## 2016-12-18 DIAGNOSIS — R42 Dizziness and giddiness: Secondary | ICD-10-CM | POA: Diagnosis not present

## 2017-01-12 ENCOUNTER — Other Ambulatory Visit: Payer: Medicare HMO

## 2017-01-12 DIAGNOSIS — B20 Human immunodeficiency virus [HIV] disease: Secondary | ICD-10-CM

## 2017-01-12 DIAGNOSIS — Z113 Encounter for screening for infections with a predominantly sexual mode of transmission: Secondary | ICD-10-CM | POA: Diagnosis not present

## 2017-01-12 LAB — CBC WITH DIFFERENTIAL/PLATELET
Basophils Absolute: 0 cells/uL (ref 0–200)
Basophils Relative: 0 %
Eosinophils Absolute: 45 cells/uL (ref 15–500)
Eosinophils Relative: 1 %
HCT: 38.3 % (ref 35.0–45.0)
Hemoglobin: 12.8 g/dL (ref 11.7–15.5)
Lymphocytes Relative: 23 %
Lymphs Abs: 1035 cells/uL (ref 850–3900)
MCH: 29.8 pg (ref 27.0–33.0)
MCHC: 33.4 g/dL (ref 32.0–36.0)
MCV: 89.1 fL (ref 80.0–100.0)
MPV: 10.7 fL (ref 7.5–12.5)
Monocytes Absolute: 270 cells/uL (ref 200–950)
Monocytes Relative: 6 %
Neutro Abs: 3150 cells/uL (ref 1500–7800)
Neutrophils Relative %: 70 %
Platelets: 177 10*3/uL (ref 140–400)
RBC: 4.3 MIL/uL (ref 3.80–5.10)
RDW: 14 % (ref 11.0–15.0)
WBC: 4.5 10*3/uL (ref 3.8–10.8)

## 2017-01-12 LAB — COMPLETE METABOLIC PANEL WITH GFR
ALT: 8 U/L (ref 6–29)
AST: 21 U/L (ref 10–35)
Albumin: 3.8 g/dL (ref 3.6–5.1)
Alkaline Phosphatase: 87 U/L (ref 33–130)
BUN: 14 mg/dL (ref 7–25)
CO2: 27 mmol/L (ref 20–32)
Calcium: 8.9 mg/dL (ref 8.6–10.4)
Chloride: 106 mmol/L (ref 98–110)
Creat: 0.91 mg/dL (ref 0.50–0.99)
GFR, Est African American: 76 mL/min (ref >=60)
GFR, Est Non African American: 65 mL/min (ref >=60)
Glucose, Bld: 97 mg/dL (ref 65–99)
Potassium: 3.6 mmol/L (ref 3.5–5.3)
Sodium: 138 mmol/L (ref 135–146)
Total Bilirubin: 0.8 mg/dL (ref 0.2–1.2)
Total Protein: 7.9 g/dL (ref 6.1–8.1)

## 2017-01-13 LAB — T-HELPER CELL (CD4) - (RCID CLINIC ONLY)
CD4 % Helper T Cell: 20 % — ABNORMAL LOW (ref 33–55)
CD4 T Cell Abs: 190 /uL — ABNORMAL LOW (ref 400–2700)

## 2017-01-13 LAB — SYPHILIS: RPR W/REFLEX TO RPR TITER AND TREPONEMAL ANTIBODIES, TRADITIONAL SCREENING AND DIAGNOSIS ALGORITHM

## 2017-01-14 ENCOUNTER — Emergency Department (HOSPITAL_COMMUNITY)
Admission: EM | Admit: 2017-01-14 | Discharge: 2017-01-14 | Discharge status: Against Medical Advice | Payer: Medicare HMO | Attending: Emergency Medicine | Admitting: Emergency Medicine

## 2017-01-14 ENCOUNTER — Encounter (HOSPITAL_COMMUNITY): Payer: Self-pay | Admitting: Emergency Medicine

## 2017-01-14 DIAGNOSIS — Z5321 Procedure and treatment not carried out due to patient leaving prior to being seen by health care provider: Secondary | ICD-10-CM | POA: Insufficient documentation

## 2017-01-14 DIAGNOSIS — R42 Dizziness and giddiness: Secondary | ICD-10-CM | POA: Diagnosis present

## 2017-01-14 LAB — CBG MONITORING, ED: Glucose-Capillary: 92 mg/dL (ref 65–99)

## 2017-01-14 NOTE — ED Notes (Signed)
Pt informed registration she was leaving and gave them their labels.

## 2017-01-14 NOTE — ED Notes (Signed)
No answer when called for blood draw 

## 2017-01-14 NOTE — ED Triage Notes (Signed)
Pt verbalizes worsening "vertigo" over past 3 weeks unrelieved by home medication.

## 2017-01-15 LAB — HIV-1 RNA QUANT-NO REFLEX-BLD
HIV 1 RNA Quant: 26 copies/mL — ABNORMAL HIGH
HIV-1 RNA Quant, Log: 1.41 Log copies/mL — ABNORMAL HIGH

## 2017-01-16 DIAGNOSIS — R42 Dizziness and giddiness: Secondary | ICD-10-CM | POA: Diagnosis not present

## 2017-01-26 ENCOUNTER — Ambulatory Visit (INDEPENDENT_AMBULATORY_CARE_PROVIDER_SITE_OTHER): Payer: Medicare HMO | Admitting: Internal Medicine

## 2017-01-26 ENCOUNTER — Encounter: Payer: Self-pay | Admitting: Internal Medicine

## 2017-01-26 VITALS — BP 131/86 | HR 75 | Temp 98.1°F | Ht 60.0 in | Wt 150.0 lb

## 2017-01-26 DIAGNOSIS — Z23 Encounter for immunization: Secondary | ICD-10-CM | POA: Diagnosis not present

## 2017-01-26 DIAGNOSIS — B20 Human immunodeficiency virus [HIV] disease: Secondary | ICD-10-CM

## 2017-01-26 NOTE — Progress Notes (Signed)
RFV: hiv disease follow up Patient ID: Vanessa Beard, female   DOB: [birth year], 66 y.o.   MRN: [id number]  HPI  66yo F with hiv disease, well controlled on current regimen. Has excellent adherence to medicaiton. Recent stressors include trying to get her 68yo son to move out.  Otherwise doing good Weight gain  Going to school  Outpatient Encounter Prescriptions as of 01/26/2017  Medication Sig  . amLODipine (NORVASC) 10 MG tablet Take 1 tablet (10 mg total) by mouth daily.  Marland Kitchen ibuprofen (ADVIL,MOTRIN) 600 MG tablet Take 1 tablet (600 mg total) by mouth every 6 (six) hours as needed.  . meloxicam (MOBIC) 15 MG tablet Take 15 mg by mouth daily.  . Multiple Vitamins-Minerals (ADULT GUMMY) CHEW Chew 3 capsules by mouth daily.  . TRIUMEQ 600-50-300 MG tablet TAKE 1 TABLET BY MOUTH DAILY  . zolpidem (AMBIEN) 10 MG tablet TAKE 1 TABLET BY MOUTH AT BEDTIME AS NEEDED   No facility-administered encounter medications on file as of 01/26/2017.      Patient Active Problem List   Diagnosis Date Noted  . Screening examination for venereal disease 03/06/2015  . Encounter for long-term (current) use of medications 03/06/2015  . Elevated LFTs 12/11/2014  . Abnormal ultrasound 12/11/2014  . Abdominal pain, epigastric 12/11/2014  . ASCUS with positive high risk HPV 10/31/2013  . Gall bladder disease 02/03/2013  . 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  . Compression fracture of L3 lumbar vertebra ([place]) 07/01/2011  . Thyroid dysfunction 07/01/2011  . Anemia 07/01/2011  . Sore throat 04/02/2011  . Hyperparathyroidism 02/10/2011  . Osteopenia 02/03/2011  . PAP SMEAR, LGSIL, ABNORMAL 12/21/2009  . COUGH 07/18/2009  . Chronic hepatitis C virus infection ([place]) 12/19/2008  . LEUKORRHEA 04/07/2008  . FATIGUE 04/07/2008  . BACK PAIN, LUMBAR 11/10/2007  . ALLERGIC RHINITIS 07/09/2007  . PNEUMOCYSTIS PNEUMONIA 05/07/2007  . HYPERTENSION 05/07/2007  . DENTAL  CARIES 05/07/2007  . INSOMNIA 05/07/2007  . PNEUMONIA, HX OF 05/07/2007  . HERPES ZOSTER, HX OF 05/07/2007  . Human immunodeficiency virus (HIV) disease ([place]) 08/04/2006     Health Maintenance Due  Topic Date Due  . COLONOSCOPY  03/08/1999  . PNA vac Low Risk Adult (1 of 2 - PCV13) 03/07/2014  . INFLUENZA VACCINE  12/17/2016     Review of Systems  Physical Exam   BP 131/86   Pulse 75   Temp 98.1 F (36.7 C) (Oral)   Ht 5' (1.524 m)   Wt 150 lb (68 kg)   BMI 29.29 kg/m    Lab Results  Component Value Date   CD4TCELL 20 (L) 01/12/2017   Lab Results  Component Value Date   CD4TABS 190 (L) 01/12/2017   CD4TABS 270 (L) 07/29/2016   CD4TABS 220 (L) 03/27/2016   Lab Results  Component Value Date   HIV1RNAQUANT 26 (H) 01/12/2017   Lab Results  Component Value Date   HEPBSAB NEG 12/29/2013   Lab Results  Component Value Date   LABRPR NON REAC 01/12/2017    CBC Lab Results  Component Value Date   WBC 4.5 01/12/2017   RBC 4.30 01/12/2017   HGB 12.8 01/12/2017   HCT 38.3 01/12/2017   PLT 177 01/12/2017   MCV 89.1 01/12/2017   MCH 29.8 01/12/2017   MCHC 33.4 01/12/2017   RDW 14.0 01/12/2017   LYMPHSABS 1,035 01/12/2017   MONOABS 270 01/12/2017   EOSABS 45 01/12/2017    BMET Lab Results  Component Value Date  NA 138 01/12/2017   K 3.6 01/12/2017   CL 106 01/12/2017   CO2 27 01/12/2017   GLUCOSE 97 01/12/2017   BUN 14 01/12/2017   CREATININE 0.91 01/12/2017   CALCIUM 8.9 01/12/2017   GFRNONAA 65 01/12/2017   GFRAA 76 01/12/2017      Assessment and Plan  hiv disease = well controlled. Had small viral blip. Will check in 57mo  Will get flu shot

## 2017-01-26 NOTE — Patient Instructions (Signed)
Get labs 2 wk before next visit

## 2017-02-10 ENCOUNTER — Emergency Department (HOSPITAL_COMMUNITY)
Admission: EM | Admit: 2017-02-10 | Discharge: 2017-02-11 | Discharge status: Against Medical Advice | Payer: Medicare HMO

## 2017-02-11 NOTE — ED Notes (Signed)
Pt sts she will just go home and see if her knee heals on its own. Pt was helped into car and given ice pack to go.

## 2017-02-15 ENCOUNTER — Emergency Department (HOSPITAL_COMMUNITY): Payer: Medicare HMO

## 2017-02-15 ENCOUNTER — Inpatient Hospital Stay (HOSPITAL_COMMUNITY): Payer: Medicare HMO

## 2017-02-15 ENCOUNTER — Inpatient Hospital Stay (HOSPITAL_COMMUNITY)
Admission: EM | Admit: 2017-02-15 | Discharge: 2017-02-21 | DRG: 493 | Disposition: A | Discharge status: Home Health | Payer: Medicare HMO | Attending: Internal Medicine | Admitting: Internal Medicine

## 2017-02-15 ENCOUNTER — Encounter (HOSPITAL_COMMUNITY): Payer: Self-pay

## 2017-02-15 DIAGNOSIS — Z8249 Family history of ischemic heart disease and other diseases of the circulatory system: Secondary | ICD-10-CM | POA: Diagnosis not present

## 2017-02-15 DIAGNOSIS — S82191A Other fracture of upper end of right tibia, initial encounter for closed fracture: Secondary | ICD-10-CM | POA: Diagnosis not present

## 2017-02-15 DIAGNOSIS — M8580 Other specified disorders of bone density and structure, unspecified site: Secondary | ICD-10-CM

## 2017-02-15 DIAGNOSIS — Z419 Encounter for procedure for purposes other than remedying health state, unspecified: Secondary | ICD-10-CM

## 2017-02-15 DIAGNOSIS — Z888 Allergy status to other drugs, medicaments and biological substances status: Secondary | ICD-10-CM | POA: Diagnosis not present

## 2017-02-15 DIAGNOSIS — Z79899 Other long term (current) drug therapy: Secondary | ICD-10-CM

## 2017-02-15 DIAGNOSIS — B20 Human immunodeficiency virus [HIV] disease: Secondary | ICD-10-CM

## 2017-02-15 DIAGNOSIS — I10 Essential (primary) hypertension: Secondary | ICD-10-CM

## 2017-02-15 DIAGNOSIS — W010XXA Fall on same level from slipping, tripping and stumbling without subsequent striking against object, initial encounter: Secondary | ICD-10-CM | POA: Diagnosis present

## 2017-02-15 DIAGNOSIS — Z791 Long term (current) use of non-steroidal anti-inflammatories (NSAID): Secondary | ICD-10-CM

## 2017-02-15 DIAGNOSIS — M810 Age-related osteoporosis without current pathological fracture: Secondary | ICD-10-CM | POA: Diagnosis present

## 2017-02-15 DIAGNOSIS — S82141A Displaced bicondylar fracture of right tibia, initial encounter for closed fracture: Secondary | ICD-10-CM | POA: Diagnosis not present

## 2017-02-15 DIAGNOSIS — Z883 Allergy status to other anti-infective agents status: Secondary | ICD-10-CM | POA: Diagnosis not present

## 2017-02-15 DIAGNOSIS — M25561 Pain in right knee: Secondary | ICD-10-CM | POA: Diagnosis not present

## 2017-02-15 DIAGNOSIS — D649 Anemia, unspecified: Secondary | ICD-10-CM | POA: Diagnosis not present

## 2017-02-15 DIAGNOSIS — Z882 Allergy status to sulfonamides status: Secondary | ICD-10-CM

## 2017-02-15 DIAGNOSIS — D62 Acute posthemorrhagic anemia: Secondary | ICD-10-CM | POA: Diagnosis not present

## 2017-02-15 DIAGNOSIS — S82101A Unspecified fracture of upper end of right tibia, initial encounter for closed fracture: Secondary | ICD-10-CM | POA: Diagnosis not present

## 2017-02-15 DIAGNOSIS — S82209A Unspecified fracture of shaft of unspecified tibia, initial encounter for closed fracture: Secondary | ICD-10-CM

## 2017-02-15 DIAGNOSIS — S82201A Unspecified fracture of shaft of right tibia, initial encounter for closed fracture: Secondary | ICD-10-CM | POA: Diagnosis present

## 2017-02-15 DIAGNOSIS — Z01810 Encounter for preprocedural cardiovascular examination: Secondary | ICD-10-CM

## 2017-02-15 DIAGNOSIS — B182 Chronic viral hepatitis C: Secondary | ICD-10-CM | POA: Diagnosis present

## 2017-02-15 DIAGNOSIS — B1920 Unspecified viral hepatitis C without hepatic coma: Secondary | ICD-10-CM | POA: Diagnosis not present

## 2017-02-15 DIAGNOSIS — J984 Other disorders of lung: Secondary | ICD-10-CM | POA: Diagnosis not present

## 2017-02-15 LAB — BASIC METABOLIC PANEL
Anion gap: 6 (ref 5–15)
BUN: 14 mg/dL (ref 6–20)
CO2: 22 mmol/L (ref 22–32)
Calcium: 9.2 mg/dL (ref 8.9–10.3)
Chloride: 109 mmol/L (ref 101–111)
Creatinine, Ser: 0.8 mg/dL (ref 0.44–1.00)
GFR calc Af Amer: >60 mL/min (ref >60)
GFR calc non Af Amer: >60 mL/min (ref >60)
Glucose, Bld: 97 mg/dL (ref 65–99)
Potassium: 3.5 mmol/L (ref 3.5–5.1)
Sodium: 137 mmol/L (ref 135–145)

## 2017-02-15 LAB — CBC WITH DIFFERENTIAL/PLATELET
Basophils Absolute: 0 10*3/uL (ref 0.0–0.1)
Basophils Relative: 1 %
Eosinophils Absolute: 0.1 10*3/uL (ref 0.0–0.7)
Eosinophils Relative: 2 %
HCT: 31.6 % — ABNORMAL LOW (ref 36.0–46.0)
Hemoglobin: 10.5 g/dL — ABNORMAL LOW (ref 12.0–15.0)
Lymphocytes Relative: 27 %
Lymphs Abs: 1.2 10*3/uL (ref 0.7–4.0)
MCH: 29.5 pg (ref 26.0–34.0)
MCHC: 33.2 g/dL (ref 30.0–36.0)
MCV: 88.8 fL (ref 78.0–100.0)
Monocytes Absolute: 0.3 10*3/uL (ref 0.1–1.0)
Monocytes Relative: 7 %
Neutro Abs: 2.8 10*3/uL (ref 1.7–7.7)
Neutrophils Relative %: 63 %
Platelets: 179 10*3/uL (ref 150–400)
RBC: 3.56 MIL/uL — ABNORMAL LOW (ref 3.87–5.11)
RDW: 14.1 % (ref 11.5–15.5)
WBC: 4.4 10*3/uL (ref 4.0–10.5)

## 2017-02-15 MED ORDER — MELOXICAM 7.5 MG PO TABS
15.0000 mg | ORAL_TABLET | Freq: Every day | ORAL | Status: DC
  Administered 2017-02-16 – 2017-02-21 (×6): 15 mg via ORAL
  Filled 2017-02-15 (×6): qty 2

## 2017-02-15 MED ORDER — ENOXAPARIN SODIUM 40 MG/0.4ML ~~LOC~~ SOLN
40.0000 mg | Freq: Every day | SUBCUTANEOUS | Status: DC
  Administered 2017-02-16 – 2017-02-17 (×2): 40 mg via SUBCUTANEOUS
  Filled 2017-02-15 (×2): qty 0.4

## 2017-02-15 MED ORDER — HYDRALAZINE HCL 20 MG/ML IJ SOLN: 10.0000 mg | Freq: Four times a day (QID) | INTRAMUSCULAR | Status: DC | PRN

## 2017-02-15 MED ORDER — ACETAMINOPHEN 325 MG PO TABS
650.0000 mg | ORAL_TABLET | Freq: Four times a day (QID) | ORAL | Status: DC | PRN
  Administered 2017-02-18: 650 mg via ORAL
  Filled 2017-02-15: qty 2

## 2017-02-15 MED ORDER — TRAMADOL HCL 50 MG PO TABS
50.0000 mg | ORAL_TABLET | Freq: Four times a day (QID) | ORAL | Status: DC | PRN
  Filled 2017-02-15 (×2): qty 1

## 2017-02-15 MED ORDER — ACETAMINOPHEN 650 MG RE SUPP: 650.0000 mg | Freq: Four times a day (QID) | RECTAL | Status: DC | PRN

## 2017-02-15 MED ORDER — AMLODIPINE BESYLATE 10 MG PO TABS
10.0000 mg | ORAL_TABLET | Freq: Every day | ORAL | Status: DC
  Administered 2017-02-16 – 2017-02-21 (×6): 10 mg via ORAL
  Filled 2017-02-15 (×6): qty 1

## 2017-02-15 MED ORDER — SODIUM CHLORIDE 0.9 % IV SOLN
INTRAVENOUS | Status: AC
  Administered 2017-02-15: via INTRAVENOUS

## 2017-02-15 MED ORDER — ZOLPIDEM TARTRATE 5 MG PO TABS
5.0000 mg | ORAL_TABLET | Freq: Every evening | ORAL | Status: DC | PRN
  Administered 2017-02-16 – 2017-02-20 (×4): 5 mg via ORAL
  Filled 2017-02-15 (×4): qty 1

## 2017-02-15 MED ORDER — MORPHINE SULFATE (PF) 4 MG/ML IV SOLN
1.0000 mg | INTRAVENOUS | Status: DC | PRN
  Administered 2017-02-15 – 2017-02-18 (×8): 1 mg via INTRAVENOUS
  Filled 2017-02-15 (×7): qty 1

## 2017-02-15 MED ORDER — ABACAVIR-DOLUTEGRAVIR-LAMIVUD 600-50-300 MG PO TABS
1.0000 | ORAL_TABLET | Freq: Every day | ORAL | Status: DC
  Administered 2017-02-16 – 2017-02-21 (×6): 1 via ORAL
  Filled 2017-02-15 (×6): qty 1

## 2017-02-15 MED ORDER — ADULT MULTIVITAMIN W/MINERALS CH
1.0000 | ORAL_TABLET | Freq: Every day | ORAL | Status: DC
  Administered 2017-02-19 – 2017-02-21 (×3): 1 via ORAL
  Filled 2017-02-15 (×5): qty 1

## 2017-02-15 NOTE — H&P (Addendum)
TRH H&P   Patient Demographics:    Vanessa Beard, is a 66 y.o. female  MRN: [id number]   DOB - [birth year]  Admit Date - 02/15/2017  Outpatient Primary MD for the patient is Vanessa Carol, MD  Referring MD/NP/PA:   Irena Beard  Outpatient Specialists:  Dr. Baxter Flattery (infectious disease)  Patient coming from: home  Chief Complaint  Patient presents with  . Knee Pain      HPI:    Vanessa Beard  is a 66 y.o. female, w Hypertension, HIV, Hepatitis C, Hyperparathyroidism, Osteoporosis [place] and when she was walking stumbled and fell onto her right knee. No syncope. Pt went to ED and it was too busy so she has been putting compresses, and keeping elevated.  Pt couldn't walk and therefore presented to ED at [facility]  In ED, CT scan => IMPRESSION: 1. Comminuted fracture of the proximal tibial metaphysis and epiphysis with multiple locations of intra-articular extension and mild posterior depression of the posterior fragments medially and laterally. This involves the tibial spines with proximally displaced tibial spine fragments. 2. Essentially nondisplaced vertical fracture of the fibular head.3. Small effusion.  Pt will be admitted for R tibial fracture.      Review of systems:    In addition to the HPI above, No Fever-chills, No Headache, No changes with Vision or hearing, No problems swallowing food or Liquids, No Chest pain, Cough or Shortness of Breath, No Abdominal pain, No Nausea or Vommitting, Bowel movements are regular, No Blood in stool or Urine, No dysuria, No new skin rashes or bruises,  No new weakness, tingling, numbness in any extremity, No recent weight gain or loss, No polyuria, polydypsia or polyphagia, No significant Mental Stressors.  A full 10 point Review of Systems was done, except as stated  above, all other Review of Systems were negative.   With Past History of the following :    Past Medical History:  Diagnosis Date  . Allergic rhinitis   . Biliary colic   . Gall bladder disease \  . Hepatitis C   . Herpes   . HIV infection ([place])   . Hyperparathyroidism ([place])   . Hypertension   . Insomnia   . Osteoporosis   . Pneumonia    2010      Past Surgical History:  Procedure Laterality Date  . CHOLECYSTECTOMY N/A 01/09/2015   Procedure: LAPAROSCOPIC CHOLECYSTECTOMY;  Surgeon: Vanessa Ok, MD;  Location: [place];  Service: General;  Laterality: N/A;  . ECTOPIC PREGNANCY SURGERY    . SHOULDER SURGERY Right 12/2014      Social History:     Social History  Substance Use Topics  . Smoking status: Never Smoker  . Smokeless tobacco: Never Used  . Alcohol use 1.8 oz/week    3 Standard drinks or equivalent per week     Comment:  wine or [place] alcohol.  Occasional     Lives - at home  Mobility - walks typically but not since Tuesday   Family History :     Family History  Problem Relation Age of Onset  . Diabetes Mother   . Hypertension Mother       Home Medications:   Prior to Admission medications   Medication Sig Start Date End Date Taking? Authorizing Provider  amLODipine (NORVASC) 10 MG tablet Take 1 tablet (10 mg total) by mouth daily. 10/29/15   Carlyle Basques, MD  ibuprofen (ADVIL,MOTRIN) 600 MG tablet Take 1 tablet (600 mg total) by mouth every 6 (six) hours as needed. 11/12/15   Varney Biles, MD  meloxicam (MOBIC) 15 MG tablet Take 15 mg by mouth daily.    [provider]  Multiple Vitamins-Minerals (ADULT GUMMY) CHEW Chew 3 capsules by mouth daily.    [provider]  TRIUMEQ 600-50-300 MG tablet TAKE 1 TABLET BY MOUTH DAILY 04/21/16   Carlyle Basques, MD  zolpidem (AMBIEN) 10 MG tablet TAKE 1 TABLET BY MOUTH AT BEDTIME AS NEEDED 09/30/16   Carlyle Basques, MD     Allergies:     Allergies  Allergen Reactions  . Dapsone  Other (See Comments)    stomach  burning  . Retrovir [Zidovudine] Other (See Comments)    Changed skin color  . Sulfamethoxazole-Trimethoprim Other (See Comments)    Flu like symptoms     Physical Exam:   Vitals  Blood pressure (!) 140/97, pulse 71, temperature 97.6 F (36.4 C), temperature source Oral, resp. rate 16, height 5' (1.524 m), weight 65.8 kg (145 lb), SpO2 99 %.   1. General  lying in bed in NAD,    2. Normal affect and insight, Not Suicidal or Homicidal, Awake Alert, Oriented X 3.  3. No F.N deficits, ALL C.Nerves Intact, Strength 5/5 all 4 extremities, Sensation intact all 4 extremities, Plantars down going.  4. Ears and Eyes appear Normal, Conjunctivae clear, PERRLA. Moist Oral Mucosa.  5. Supple Neck, No JVD, No cervical lymphadenopathy appriciated, No Carotid Bruits.  6. Symmetrical Chest wall movement, Good air movement bilaterally, CTAB.  7. RRR, No Gallops, Rubs or Murmurs, No Parasternal Heave.  8. Positive Bowel Sounds, Abdomen Soft, No tenderness, No organomegaly appriciated,No rebound -guarding or rigidity.  9.  No Cyanosis, Normal Skin Turgor, No Skin Rash or Bruise.  10. Good muscle tone,  joints appear normal , no effusions, Normal ROM.  11. No Palpable Lymph Nodes in Neck or Axillae     Data Review:    CBC No results for input(s): WBC, HGB, HCT, PLT, MCV, MCH, MCHC, RDW, LYMPHSABS, MONOABS, EOSABS, BASOSABS, BANDABS in the last 168 hours.  Invalid input(s): NEUTRABS, BANDSABD ------------------------------------------------------------------------------------------------------------------  Chemistries  No results for input(s): NA, K, CL, CO2, GLUCOSE, BUN, CREATININE, CALCIUM, MG, AST, ALT, ALKPHOS, BILITOT in the last 168 hours.  Invalid input(s): GFRCGP ------------------------------------------------------------------------------------------------------------------ CrCl cannot be calculated (Patient's most recent lab result is older  than the maximum 21 days allowed.). ------------------------------------------------------------------------------------------------------------------ No results for input(s): TSH, T4TOTAL, T3FREE, THYROIDAB in the last 72 hours.  Invalid input(s): FREET3  Coagulation profile No results for input(s): INR, PROTIME in the last 168 hours. ------------------------------------------------------------------------------------------------------------------- No results for input(s): DDIMER in the last 72 hours. -------------------------------------------------------------------------------------------------------------------  Cardiac Enzymes No results for input(s): CKMB, TROPONINI, MYOGLOBIN in the last 168 hours.  Invalid input(s): CK ------------------------------------------------------------------------------------------------------------------ No results found for: BNP   ---------------------------------------------------------------------------------------------------------------  Urinalysis    Component Value Date/Time  COLORURINE ORANGE (A) 11/03/2014 2000   APPEARANCEUR CLOUDY (A) 11/03/2014 2000   LABSPEC 1.022 11/03/2014 2000   PHURINE 6.0 11/03/2014 2000   GLUCOSEU NEGATIVE 11/03/2014 2000   HGBUR NEGATIVE 11/03/2014 2000   BILIRUBINUR MODERATE (A) 11/03/2014 2000   KETONESUR NEGATIVE 11/03/2014 2000   PROTEINUR NEGATIVE 11/03/2014 2000   UROBILINOGEN >8.0 (H) 11/03/2014 2000   NITRITE NEGATIVE 11/03/2014 2000   LEUKOCYTESUR SMALL (A) 11/03/2014 2000    ----------------------------------------------------------------------------------------------------------------   Imaging Results:    Ct Knee Right Wo Contrast  Result Date: 02/15/2017 CLINICAL DATA:  Comminuted right tibial metaphysis and epiphysis fracture on radiographs earlier today. There is mild depression of the lateral tibial plateau. The patient fell on the knee 5 days ago. EXAM: CT OF THE RIGHT KNEE WITHOUT  CONTRAST TECHNIQUE: Multidetector CT imaging of the right knee was performed according to the standard protocol. Multiplanar CT image reconstructions were also generated. COMPARISON:  Right knee radiographs obtained earlier today. FINDINGS: Bones/Joint/Cartilage Previously demonstrated comminuted fracture of the proximal tibial metaphysis and epiphysis with multiple locations of intra-articular extension. This includes the tibial spines, with proximally displaced fragments of the tibial spines. There is 4 mm of depression of the posterior aspect of the larger posterior fragment of the lateral tibial plateau fracture. There is also 1 mm of depression of the posterior fragment of the medial tibial plateau fracture. Also demonstrated is an essentially nondisplaced vertical fracture of the fibular head. Ligaments Suboptimally assessed by CT. Muscles and Tendons Appear intact. Soft tissues Mild subcutaneous edema.  Small effusion. IMPRESSION: 1. Comminuted fracture of the proximal tibial metaphysis and epiphysis with multiple locations of intra-articular extension and mild posterior depression of the posterior fragments medially and laterally. This involves the tibial spines with proximally displaced tibial spine fragments. 2. Essentially nondisplaced vertical fracture of the fibular head. 3. Small effusion. Electronically Signed   By: Claudie Revering M.D.   On: 02/15/2017 18:58   Dg Knee Complete 4 Views Right  Result Date: 02/15/2017 CLINICAL DATA:  Anterior, medial posterior right knee pain after falling on the knee. EXAM: RIGHT KNEE - COMPLETE 4+ VIEW COMPARISON:  None. FINDINGS: Comminuted fracture of the proximal tibial metaphysis and epiphysis, with multiple sites of intra-articular extension. There is some depression of the lateral tibial plateau. Small to moderate-sized effusion. Probable essentially nondisplaced fracture of the posterior aspect of the fibular head. IMPRESSION: 1. Comminuted proximal tibial  metaphysis and epiphysis fracture with multiple sites of intra-articular extension and mild depression of the lateral tibial plateau. This could be better defined with CT. 2. Probable nondisplaced fibular head fracture. 3. Small to moderate-sized effusion. Electronically Signed   By: Claudie Revering M.D.   On: 02/15/2017 16:52      Assessment & Plan:    Principal Problem:   Tibial fracture Active Problems:   Human immunodeficiency virus (HIV) disease (HCC)   Osteopenia   Anemia   Benign hypertension    R prox Tibial fracture NPO after midnite Continue meloxicam Tramadol prn Morphine sulfate 1mg  iv q4h prn severe pain Cbc, cmp , pt, ptt, 12 lead ekg, CXR, ua for preoperative purposes Orthopedics consulted by ED  Osteopenia/porosis Bone density as outpatient please by PCP  Hypertension Continue amlodipine Hydralazine 10mg  iv q6h prn sbp >160  Anemia Check cbc in am  HIV/HEPC Cont current anti retroviral    DVT Prophylaxis  Lovenox - SCDs   AM Labs Ordered, also please review Full Orders  Family Communication: Admission, patients condition and plan of  care including tests being ordered have been discussed with the patient who indicate understanding and agree with the plan and Code Status.  Code Status FULL CODE  Likely DC to  Home if possible per pt  Condition GUARDED    Consults called: orthopedics per ED  Admission status: inpatient  Time spent in minutes : 45    Jani Gravel M.D on 02/15/2017 at 8:43 PM  Between 7am to 7pm - Pager - [phone]. After 7pm go to [email or web address] - password [facility]  [facility] - Office  [phone]

## 2017-02-15 NOTE — ED Notes (Signed)
Patient brought back from xray by transporter who assisted her to the bathroom.  Patient now aware that we need a urine specimen.  To let staff know when able to go.

## 2017-02-15 NOTE — Progress Notes (Signed)
Orthopedic Tech Progress Note Patient Details:  Vanessa Beard [birth year] [id number]  Ortho Devices Type of Ortho Device: Knee Immobilizer, Crutches Ortho Device/Splint Location: RLE Ortho Device/Splint Interventions: Ordered, Application   Braulio Bosch 02/15/2017, 9:13 PM

## 2017-02-15 NOTE — ED Notes (Signed)
Patient in xray at this time.  To take to the floor when she gets back.

## 2017-02-15 NOTE — ED Triage Notes (Signed)
Pt presents for evaluation of R knee pain following fall last Tuesday. Pt reports ongoing swelling and inability to bear weight. Has been using ice and ace wrap with no improvement.

## 2017-02-15 NOTE — ED Notes (Signed)
Ortho at the bedside at this time

## 2017-02-15 NOTE — ED Notes (Signed)
Patient transported to X-ray 

## 2017-02-16 DIAGNOSIS — S82141A Displaced bicondylar fracture of right tibia, initial encounter for closed fracture: Principal | ICD-10-CM

## 2017-02-16 LAB — COMPREHENSIVE METABOLIC PANEL
ALT: 15 U/L (ref 14–54)
AST: 26 U/L (ref 15–41)
Albumin: 2.9 g/dL — ABNORMAL LOW (ref 3.5–5.0)
Alkaline Phosphatase: 57 U/L (ref 38–126)
Anion gap: 4 — ABNORMAL LOW (ref 5–15)
BUN: 11 mg/dL (ref 6–20)
CO2: 25 mmol/L (ref 22–32)
Calcium: 8.5 mg/dL — ABNORMAL LOW (ref 8.9–10.3)
Chloride: 110 mmol/L (ref 101–111)
Creatinine, Ser: 0.75 mg/dL (ref 0.44–1.00)
GFR calc Af Amer: >60 mL/min (ref >60)
GFR calc non Af Amer: >60 mL/min (ref >60)
Glucose, Bld: 93 mg/dL (ref 65–99)
Potassium: 3.1 mmol/L — ABNORMAL LOW (ref 3.5–5.1)
Sodium: 139 mmol/L (ref 135–145)
Total Bilirubin: 1 mg/dL (ref 0.3–1.2)
Total Protein: 7 g/dL (ref 6.5–8.1)

## 2017-02-16 LAB — CBC
HCT: 30.1 % — ABNORMAL LOW (ref 36.0–46.0)
Hemoglobin: 9.8 g/dL — ABNORMAL LOW (ref 12.0–15.0)
MCH: 29 pg (ref 26.0–34.0)
MCHC: 32.6 g/dL (ref 30.0–36.0)
MCV: 89.1 fL (ref 78.0–100.0)
Platelets: 167 10*3/uL (ref 150–400)
RBC: 3.38 MIL/uL — ABNORMAL LOW (ref 3.87–5.11)
RDW: 13.9 % (ref 11.5–15.5)
WBC: 4.1 10*3/uL (ref 4.0–10.5)

## 2017-02-16 LAB — URINALYSIS, ROUTINE W REFLEX MICROSCOPIC
Bilirubin Urine: NEGATIVE
Glucose, UA: NEGATIVE mg/dL
Hgb urine dipstick: NEGATIVE
Ketones, ur: NEGATIVE mg/dL
Leukocytes, UA: NEGATIVE
Nitrite: NEGATIVE
Protein, ur: NEGATIVE mg/dL
Specific Gravity, Urine: 1.019 (ref 1.005–1.030)
pH: 5 (ref 5.0–8.0)

## 2017-02-16 LAB — TSH: TSH: 2.972 u[IU]/mL (ref 0.350–4.500)

## 2017-02-16 LAB — SURGICAL PCR SCREEN
MRSA, PCR: POSITIVE — AB
Staphylococcus aureus: POSITIVE — AB

## 2017-02-16 MED ORDER — CHLORHEXIDINE GLUCONATE CLOTH 2 % EX PADS
6.0000 | MEDICATED_PAD | Freq: Every day | CUTANEOUS | Status: AC
  Administered 2017-02-16 – 2017-02-18 (×3): 6 via TOPICAL

## 2017-02-16 MED ORDER — MUPIROCIN 2 % EX OINT
1.0000 "application " | TOPICAL_OINTMENT | Freq: Two times a day (BID) | CUTANEOUS | Status: AC
  Administered 2017-02-16 – 2017-02-20 (×9): 1 via NASAL
  Filled 2017-02-16 (×4): qty 22

## 2017-02-16 NOTE — Consult Note (Signed)
ORTHOPAEDIC CONSULTATION  REQUESTING PHYSICIAN: Jani Gravel, MD  Chief Complaint: right tibial plateau fx  HPI: Vanessa Beard is a 66 y.o. female who presents with right tibial plateau fx s/p fall about 6 days ago.  Had progressive worsening and difficulty with weight bearing and could not walk.  She presented to ED last night and xrays showed comminuted displaced right tibial plateau fx.  Denies any numbness or tingling.  Ortho consulted.  Past Medical History:  Diagnosis Date  . Allergic rhinitis   . Biliary colic   . Gall bladder disease \  . Hepatitis C   . Herpes   . HIV infection ([place])   . Hyperparathyroidism ([place])   . Hypertension   . Insomnia   . Osteoporosis   . Pneumonia    2010   Past Surgical History:  Procedure Laterality Date  . CHOLECYSTECTOMY N/A 01/09/2015   Procedure: LAPAROSCOPIC CHOLECYSTECTOMY;  Surgeon: [name], MD;  Location: [place];  Service: General;  Laterality: N/A;  . ECTOPIC PREGNANCY SURGERY    . SHOULDER SURGERY Right 12/2014   Social History   Social History  . Marital status: Divorced    Spouse name: N/A  . Number of children: N/A  . Years of education: N/A   Occupational History  . CNA    Social History Main Topics  . Smoking status: Never Smoker  . Smokeless tobacco: Never Used  . Alcohol use 1.8 oz/week    3 Standard drinks or equivalent per week     Comment: wine or [place] alcohol.  Occasional  . Drug use: No  . Sexual activity: Yes    Partners: Male    Birth control/ protection: Condom     Comment: declined condoms   Other Topics Concern  . None   Social History Narrative  . None   Family History  Problem Relation Age of Onset  . Diabetes Mother   . Hypertension Mother    - negative except otherwise stated in the family history section Allergies  Allergen Reactions  . Dapsone Other (See Comments)    stomach  burning  . Retrovir [Zidovudine] Other (See Comments)    Changed skin color  .  Sulfamethoxazole-Trimethoprim Other (See Comments)    Flu like symptoms   Prior to Admission medications   Medication Sig Start Date End Date Taking? Authorizing Provider  amLODipine (NORVASC) 10 MG tablet Take 1 tablet (10 mg total) by mouth daily. 10/29/15  Yes Carlyle Basques, MD  ibuprofen (ADVIL,MOTRIN) 600 MG tablet Take 1 tablet (600 mg total) by mouth every 6 (six) hours as needed. Patient taking differently: Take 600 mg by mouth every 6 (six) hours as needed for moderate pain.  11/12/15  Yes Varney Biles, MD  Multiple Vitamins-Minerals (ADULT GUMMY) CHEW Chew 3 capsules by mouth daily.   Yes [provider]  TRIUMEQ 600-50-300 MG tablet TAKE 1 TABLET BY MOUTH DAILY 04/21/16  Yes Carlyle Basques, MD  zolpidem (AMBIEN) 10 MG tablet TAKE 1 TABLET BY MOUTH AT BEDTIME AS NEEDED Patient taking differently: TAKE 1 TABLET BY MOUTH AT BEDTIME AS FOR SLEEP 09/30/16  Yes Carlyle Basques, MD   Dg Chest 2 View  Result Date: 02/15/2017 CLINICAL DATA:  Pre-op chest exam; no chest complaints. Hx of HTN. Pt is a former smoker. Pt is having right knee surgery tomorrow. EXAM: CHEST  2 VIEW COMPARISON:  11/12/2015 FINDINGS: Normal heart size and pulmonary vascularity. Areas of linear scarring in the left lung and right mid lung  similar to previous study. No developing consolidation or edema. No blunting of costophrenic angles. No pneumothorax. Tortuous aorta. Old resection or resorption of the distal right clavicle. Degenerative changes in the spine. IMPRESSION: Linear scarring in both lungs is unchanged since prior study. No evidence of active consolidation. Tortuous aorta. Electronically Signed   By: Lucienne Capers M.D.   On: 02/15/2017 22:42   Ct Knee Right Wo Contrast  Result Date: 02/15/2017 CLINICAL DATA:  Comminuted right tibial metaphysis and epiphysis fracture on radiographs earlier today. There is mild depression of the lateral tibial plateau. The patient fell on the knee 5 days ago.  EXAM: CT OF THE RIGHT KNEE WITHOUT CONTRAST TECHNIQUE: Multidetector CT imaging of the right knee was performed according to the standard protocol. Multiplanar CT image reconstructions were also generated. COMPARISON:  Right knee radiographs obtained earlier today. FINDINGS: Bones/Joint/Cartilage Previously demonstrated comminuted fracture of the proximal tibial metaphysis and epiphysis with multiple locations of intra-articular extension. This includes the tibial spines, with proximally displaced fragments of the tibial spines. There is 4 mm of depression of the posterior aspect of the larger posterior fragment of the lateral tibial plateau fracture. There is also 1 mm of depression of the posterior fragment of the medial tibial plateau fracture. Also demonstrated is an essentially nondisplaced vertical fracture of the fibular head. Ligaments Suboptimally assessed by CT. Muscles and Tendons Appear intact. Soft tissues Mild subcutaneous edema.  Small effusion. IMPRESSION: 1. Comminuted fracture of the proximal tibial metaphysis and epiphysis with multiple locations of intra-articular extension and mild posterior depression of the posterior fragments medially and laterally. This involves the tibial spines with proximally displaced tibial spine fragments. 2. Essentially nondisplaced vertical fracture of the fibular head. 3. Small effusion. Electronically Signed   By: Claudie Revering M.D.   On: 02/15/2017 18:58   Dg Knee Complete 4 Views Right  Result Date: 02/15/2017 CLINICAL DATA:  Anterior, medial posterior right knee pain after falling on the knee. EXAM: RIGHT KNEE - COMPLETE 4+ VIEW COMPARISON:  None. FINDINGS: Comminuted fracture of the proximal tibial metaphysis and epiphysis, with multiple sites of intra-articular extension. There is some depression of the lateral tibial plateau. Small to moderate-sized effusion. Probable essentially nondisplaced fracture of the posterior aspect of the fibular head.  IMPRESSION: 1. Comminuted proximal tibial metaphysis and epiphysis fracture with multiple sites of intra-articular extension and mild depression of the lateral tibial plateau. This could be better defined with CT. 2. Probable nondisplaced fibular head fracture. 3. Small to moderate-sized effusion. Electronically Signed   By: Claudie Revering M.D.   On: 02/15/2017 16:52   - pertinent xrays, CT, MRI studies were reviewed and independently interpreted  Positive ROS: All other systems have been reviewed and were otherwise negative with the exception of those mentioned in the HPI and as above.  Physical Exam: General: Alert, no acute distress Cardiovascular: No pedal edema Respiratory: No cyanosis, no use of accessory musculature GI: No organomegaly, abdomen is soft and non-tender Skin: No lesions in the area of chief complaint Neurologic: Sensation intact distally Psychiatric: Patient is competent for consent with normal mood and affect Lymphatic: No axillary or cervical lymphadenopathy  MUSCULOSKELETAL:  - soft compartments - skin intact - small knee effusion  Assessment: Right bicondylar tibial plateau fx  Plan: - CT shows displacement of the posteromedial fragment within the weight bearing portion - she is very active and is fully independent with ADLs - recommend ORIF - she is in agreement - patient may go home today  and come back on Wednesday for surgery which we will set her up with - she needs to be NWB RLE at all times  Thank you for the consult and the opportunity to see Ms. Gordon-Peoples  N. Eduard Roux, MD [place] 7:33 AM

## 2017-02-16 NOTE — ED Provider Notes (Signed)
[facility] DEPT Provider Note   CSN: [id number] Arrival date & time: 02/15/17  1522     History   Chief Complaint Chief Complaint  Patient presents with  . Knee Pain    HPI Merlie Noga is a 66 y.o. female.  HPI Patient presents to the emergency department withright knee pain following a fall that occurred on Tuesday.  The patient states that she was unable to bear weight after the fall.  She states that movement and palpation make the pain worse.  She states she did not take any medications prior to arrival.  Patient states she has no other injuries from the fall Past Medical History:  Diagnosis Date  . Allergic rhinitis   . Biliary colic   . Gall bladder disease \  . Hepatitis C   . Herpes   . HIV infection ([place])   . Hyperparathyroidism ([place])   . Hypertension   . Insomnia   . Osteoporosis   . Pneumonia    2010    Patient Active Problem List   Diagnosis Date Noted  . Tibial fracture 02/15/2017  . Benign hypertension 02/15/2017  . Screening examination for venereal disease 03/06/2015  . Encounter for long-term (current) use of medications 03/06/2015  . Elevated LFTs 12/11/2014  . Abnormal ultrasound 12/11/2014  . Abdominal pain, epigastric 12/11/2014  . ASCUS with positive high risk HPV 10/31/2013  . Gall bladder disease 02/03/2013  . 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  . Compression fracture of L3 lumbar vertebra ([place]) 07/01/2011  . Thyroid dysfunction 07/01/2011  . Anemia 07/01/2011  . Sore throat 04/02/2011  . Hyperparathyroidism 02/10/2011  . Osteopenia 02/03/2011  . PAP SMEAR, LGSIL, ABNORMAL 12/21/2009  . COUGH 07/18/2009  . Chronic hepatitis C virus infection ([place]) 12/19/2008  . LEUKORRHEA 04/07/2008  . FATIGUE 04/07/2008  . BACK PAIN, LUMBAR 11/10/2007  . ALLERGIC RHINITIS 07/09/2007  . PNEUMOCYSTIS PNEUMONIA 05/07/2007  . HYPERTENSION 05/07/2007  . DENTAL CARIES 05/07/2007  . INSOMNIA 05/07/2007  .  PNEUMONIA, HX OF 05/07/2007  . HERPES ZOSTER, HX OF 05/07/2007  . Human immunodeficiency virus (HIV) disease ([place]) 08/04/2006    Past Surgical History:  Procedure Laterality Date  . CHOLECYSTECTOMY N/A 01/09/2015   Procedure: LAPAROSCOPIC CHOLECYSTECTOMY;  Surgeon: [name], MD;  Location: [facility];  Service: General;  Laterality: N/A;  . ECTOPIC PREGNANCY SURGERY    . SHOULDER SURGERY Right 12/2014    OB History    Gravida Para Term Preterm AB Living   5 3 3  0 2 2   SAB TAB Ectopic Multiple Live Births   0 1 1 0         Home Medications    Prior to Admission medications   Medication Sig Start Date End Date Taking? Authorizing Provider  amLODipine (NORVASC) 10 MG tablet Take 1 tablet (10 mg total) by mouth daily. 10/29/15  Yes Carlyle Basques, MD  ibuprofen (ADVIL,MOTRIN) 600 MG tablet Take 1 tablet (600 mg total) by mouth every 6 (six) hours as needed. Patient taking differently: Take 600 mg by mouth every 6 (six) hours as needed for moderate pain.  11/12/15  Yes Varney Biles, MD  Multiple Vitamins-Minerals (ADULT GUMMY) CHEW Chew 3 capsules by mouth daily.   Yes [provider]  TRIUMEQ 600-50-300 MG tablet TAKE 1 TABLET BY MOUTH DAILY 04/21/16  Yes Carlyle Basques, MD  zolpidem (AMBIEN) 10 MG tablet TAKE 1 TABLET BY MOUTH AT BEDTIME AS NEEDED Patient taking differently: TAKE 1 TABLET BY MOUTH AT  BEDTIME AS FOR SLEEP 09/30/16  Yes Carlyle Basques, MD    Family History Family History  Problem Relation Age of Onset  . Diabetes Mother   . Hypertension Mother     Social History Social History  Substance Use Topics  . Smoking status: Never Smoker  . Smokeless tobacco: Never Used  . Alcohol use 1.8 oz/week    3 Standard drinks or equivalent per week     Comment: wine or Lust alcohol.  Occasional     Allergies   Dapsone; Retrovir [zidovudine]; and Sulfamethoxazole-trimethoprim   Review of Systems Review of Systems All other systems negative except as  documented in the HPI. All pertinent positives and negatives as reviewed in the HPI.  Physical Exam Updated Vital Signs BP (!) 178/104 (BP Location: Right Arm)   Pulse 72   Temp 98.1 F (36.7 C) (Oral)   Resp 16   Ht 5' (1.524 m)   Wt 65.8 kg (145 lb)   SpO2 98%   BMI 28.32 kg/m   Physical Exam  Constitutional: She is oriented to person, place, and time. She appears well-developed and well-nourished. No distress.  HENT:  Head: Normocephalic and atraumatic.  Eyes: Pupils are equal, round, and reactive to light.  Pulmonary/Chest: Effort normal.  Musculoskeletal:       Right knee: She exhibits decreased range of motion, swelling, effusion, ecchymosis and bony tenderness. Tenderness found.  Neurological: She is alert and oriented to person, place, and time.  Skin: Skin is warm and dry.  Psychiatric: She has a normal mood and affect.  Nursing note and vitals reviewed.    ED Treatments / Results  Labs (all labs ordered are listed, but only abnormal results are displayed) Labs Reviewed  CBC WITH DIFFERENTIAL/PLATELET - Abnormal; Notable for the following:       Result Value   RBC 3.56 (*)    Hemoglobin 10.5 (*)    HCT 31.6 (*)    All other components within normal limits  SURGICAL PCR SCREEN  BASIC METABOLIC PANEL  URINALYSIS, ROUTINE W REFLEX MICROSCOPIC  COMPREHENSIVE METABOLIC PANEL  CBC  TSH    EKG  EKG Interpretation None       Radiology Dg Chest 2 View  Result Date: 02/15/2017 CLINICAL DATA:  Pre-op chest exam; no chest complaints. Hx of HTN. Pt is a former smoker. Pt is having right knee surgery tomorrow. EXAM: CHEST  2 VIEW COMPARISON:  11/12/2015 FINDINGS: Normal heart size and pulmonary vascularity. Areas of linear scarring in the left lung and right mid lung similar to previous study. No developing consolidation or edema. No blunting of costophrenic angles. No pneumothorax. Tortuous aorta. Old resection or resorption of the distal right clavicle.  Degenerative changes in the spine. IMPRESSION: Linear scarring in both lungs is unchanged since prior study. No evidence of active consolidation. Tortuous aorta. Electronically Signed   By: Lucienne Capers M.D.   On: 02/15/2017 22:42   Ct Knee Right Wo Contrast  Result Date: 02/15/2017 CLINICAL DATA:  Comminuted right tibial metaphysis and epiphysis fracture on radiographs earlier today. There is mild depression of the lateral tibial plateau. The patient fell on the knee 5 days ago. EXAM: CT OF THE RIGHT KNEE WITHOUT CONTRAST TECHNIQUE: Multidetector CT imaging of the right knee was performed according to the standard protocol. Multiplanar CT image reconstructions were also generated. COMPARISON:  Right knee radiographs obtained earlier today. FINDINGS: Bones/Joint/Cartilage Previously demonstrated comminuted fracture of the proximal tibial metaphysis and epiphysis with multiple locations of  intra-articular extension. This includes the tibial spines, with proximally displaced fragments of the tibial spines. There is 4 mm of depression of the posterior aspect of the larger posterior fragment of the lateral tibial plateau fracture. There is also 1 mm of depression of the posterior fragment of the medial tibial plateau fracture. Also demonstrated is an essentially nondisplaced vertical fracture of the fibular head. Ligaments Suboptimally assessed by CT. Muscles and Tendons Appear intact. Soft tissues Mild subcutaneous edema.  Small effusion. IMPRESSION: 1. Comminuted fracture of the proximal tibial metaphysis and epiphysis with multiple locations of intra-articular extension and mild posterior depression of the posterior fragments medially and laterally. This involves the tibial spines with proximally displaced tibial spine fragments. 2. Essentially nondisplaced vertical fracture of the fibular head. 3. Small effusion. Electronically Signed   By: Claudie Revering M.D.   On: 02/15/2017 18:58   Dg Knee Complete 4  Views Right  Result Date: 02/15/2017 CLINICAL DATA:  Anterior, medial posterior right knee pain after falling on the knee. EXAM: RIGHT KNEE - COMPLETE 4+ VIEW COMPARISON:  None. FINDINGS: Comminuted fracture of the proximal tibial metaphysis and epiphysis, with multiple sites of intra-articular extension. There is some depression of the lateral tibial plateau. Small to moderate-sized effusion. Probable essentially nondisplaced fracture of the posterior aspect of the fibular head. IMPRESSION: 1. Comminuted proximal tibial metaphysis and epiphysis fracture with multiple sites of intra-articular extension and mild depression of the lateral tibial plateau. This could be better defined with CT. 2. Probable nondisplaced fibular head fracture. 3. Small to moderate-sized effusion. Electronically Signed   By: Claudie Revering M.D.   On: 02/15/2017 16:52    Procedures Procedures (including critical care time)  Medications Ordered in ED Medications  zolpidem (AMBIEN) tablet 5 mg (not administered)  abacavir-dolutegravir-lamiVUDine (TRIUMEQ) 600-50-300 MG per tablet 1 tablet (not administered)  meloxicam (MOBIC) tablet 15 mg (not administered)  multivitamin with minerals tablet 1 tablet (not administered)  amLODipine (NORVASC) tablet 10 mg (not administered)  enoxaparin (LOVENOX) injection 40 mg (not administered)  0.9 %  sodium chloride infusion ( Intravenous New Bag/Given 02/15/17 2344)  acetaminophen (TYLENOL) tablet 650 mg (not administered)    Or  acetaminophen (TYLENOL) suppository 650 mg (not administered)  traMADol (ULTRAM) tablet 50 mg (not administered)  morphine 4 MG/ML injection 1 mg (1 mg Intravenous Given 02/15/17 2220)  hydrALAZINE (APRESOLINE) injection 10 mg (not administered)     Initial Impression / Assessment and Plan / ED Course  I have reviewed the triage vital signs and the nursing notes.  Pertinent labs & imaging results that were available during my care of the patient were  reviewed by me and considered in my medical decision making (see chart for details).     I spoke with orthopedics about the patient.  He would like the patient to be admitted by the hospitalist and he will see the patient in the morning. Ortho was Dr. Erlinda Hong. Patient is advised plan and all questions were answered  Final Clinical Impressions(s) / ED Diagnoses   Final diagnoses:  Closed fracture of right tibial plateau, initial encounter    New Prescriptions Current Discharge Medication List       Dalia Heading, PA-C 02/16/17 0049    Tanna Furry, MD 02/23/17 [phone]

## 2017-02-17 LAB — BASIC METABOLIC PANEL
Anion gap: 5 (ref 5–15)
BUN: 11 mg/dL (ref 6–20)
CO2: 28 mmol/L (ref 22–32)
Calcium: 9 mg/dL (ref 8.9–10.3)
Chloride: 105 mmol/L (ref 101–111)
Creatinine, Ser: 0.74 mg/dL (ref 0.44–1.00)
GFR calc Af Amer: >60 mL/min (ref >60)
GFR calc non Af Amer: >60 mL/min (ref >60)
Glucose, Bld: 95 mg/dL (ref 65–99)
Potassium: 3.3 mmol/L — ABNORMAL LOW (ref 3.5–5.1)
Sodium: 138 mmol/L (ref 135–145)

## 2017-02-17 MED ORDER — CEFAZOLIN SODIUM-DEXTROSE 2-4 GM/100ML-% IV SOLN
2.0000 g | Freq: Once | INTRAVENOUS | Status: AC
  Administered 2017-02-18: 2 g via INTRAVENOUS
  Filled 2017-02-17: qty 100

## 2017-02-18 ENCOUNTER — Inpatient Hospital Stay (HOSPITAL_COMMUNITY): Payer: Medicare HMO

## 2017-02-18 ENCOUNTER — Inpatient Hospital Stay (HOSPITAL_COMMUNITY): Admission: RE | Admit: 2017-02-18 | Payer: Medicare HMO | Source: Ambulatory Visit | Admitting: Orthopaedic Surgery

## 2017-02-18 ENCOUNTER — Encounter (HOSPITAL_COMMUNITY): Payer: Self-pay | Admitting: Orthopedic Surgery

## 2017-02-18 ENCOUNTER — Encounter (HOSPITAL_COMMUNITY)
Admission: EM | Disposition: A | Discharge status: Home Health | Payer: Self-pay | Source: Home / Self Care | Attending: Internal Medicine

## 2017-02-18 ENCOUNTER — Inpatient Hospital Stay (HOSPITAL_COMMUNITY): Payer: Medicare HMO | Admitting: Certified Registered Nurse Anesthetist

## 2017-02-18 DIAGNOSIS — S82141A Displaced bicondylar fracture of right tibia, initial encounter for closed fracture: Secondary | ICD-10-CM

## 2017-02-18 HISTORY — PX: ORIF TIBIA PLATEAU: SHX2132

## 2017-02-18 LAB — CBC
HCT: 35 % — ABNORMAL LOW (ref 36.0–46.0)
Hemoglobin: 11.3 g/dL — ABNORMAL LOW (ref 12.0–15.0)
MCH: 29 pg (ref 26.0–34.0)
MCHC: 32.3 g/dL (ref 30.0–36.0)
MCV: 90 fL (ref 78.0–100.0)
Platelets: 254 10*3/uL (ref 150–400)
RBC: 3.89 MIL/uL (ref 3.87–5.11)
RDW: 13.7 % (ref 11.5–15.5)
WBC: 9.6 10*3/uL (ref 4.0–10.5)

## 2017-02-18 LAB — CREATININE, SERUM
Creatinine, Ser: 0.74 mg/dL (ref 0.44–1.00)
GFR calc Af Amer: >60 mL/min (ref >60)
GFR calc non Af Amer: >60 mL/min (ref >60)

## 2017-02-18 SURGERY — OPEN REDUCTION INTERNAL FIXATION (ORIF) TIBIAL PLATEAU
Anesthesia: General | Site: Knee | Laterality: Right

## 2017-02-18 MED ORDER — FENTANYL CITRATE (PF) 100 MCG/2ML IJ SOLN
INTRAMUSCULAR | Status: DC | PRN
  Administered 2017-02-18: 100 ug via INTRAVENOUS
  Administered 2017-02-18: 50 ug via INTRAVENOUS
  Administered 2017-02-18 (×2): 100 ug via INTRAVENOUS
  Administered 2017-02-18 (×3): 50 ug via INTRAVENOUS

## 2017-02-18 MED ORDER — ONDANSETRON HCL 4 MG PO TABS
4.0000 mg | ORAL_TABLET | Freq: Four times a day (QID) | ORAL | Status: DC | PRN
  Administered 2017-02-19: 4 mg via ORAL
  Filled 2017-02-18: qty 1

## 2017-02-18 MED ORDER — LACTATED RINGERS IV SOLN
INTRAVENOUS | Status: DC
  Administered 2017-02-18 (×3): via INTRAVENOUS

## 2017-02-18 MED ORDER — SUGAMMADEX SODIUM 200 MG/2ML IV SOLN
INTRAVENOUS | Status: DC | PRN
  Administered 2017-02-18: 200 mg via INTRAVENOUS

## 2017-02-18 MED ORDER — BUPIVACAINE HCL (PF) 0.25 % IJ SOLN
INTRAMUSCULAR | Status: AC
  Filled 2017-02-18: qty 30

## 2017-02-18 MED ORDER — ENOXAPARIN SODIUM 40 MG/0.4ML ~~LOC~~ SOLN
40.0000 mg | SUBCUTANEOUS | Status: DC
  Administered 2017-02-19 – 2017-02-21 (×3): 40 mg via SUBCUTANEOUS
  Filled 2017-02-18 (×3): qty 0.4

## 2017-02-18 MED ORDER — LIDOCAINE 2% (20 MG/ML) 5 ML SYRINGE
INTRAMUSCULAR | Status: AC
  Filled 2017-02-18: qty 5

## 2017-02-18 MED ORDER — FENTANYL CITRATE (PF) 250 MCG/5ML IJ SOLN
INTRAMUSCULAR | Status: AC
Start: 2017-02-18 — End: 2017-02-18
  Filled 2017-02-18: qty 5

## 2017-02-18 MED ORDER — ONDANSETRON HCL 4 MG/2ML IJ SOLN
INTRAMUSCULAR | Status: DC | PRN
  Administered 2017-02-18: 4 mg via INTRAVENOUS

## 2017-02-18 MED ORDER — METOCLOPRAMIDE HCL 5 MG PO TABS: 5.0000 mg | ORAL_TABLET | Freq: Three times a day (TID) | ORAL | Status: DC | PRN

## 2017-02-18 MED ORDER — POLYETHYLENE GLYCOL 3350 17 G PO PACK: 17.0000 g | PACK | Freq: Every day | ORAL | Status: DC | PRN

## 2017-02-18 MED ORDER — DEXAMETHASONE SODIUM PHOSPHATE 4 MG/ML IJ SOLN
INTRAMUSCULAR | Status: DC | PRN
  Administered 2017-02-18: 10 mg via INTRAVENOUS

## 2017-02-18 MED ORDER — HYDROMORPHONE HCL 1 MG/ML IJ SOLN
0.2500 mg | INTRAMUSCULAR | Status: DC | PRN
  Administered 2017-02-18 (×4): 0.5 mg via INTRAVENOUS

## 2017-02-18 MED ORDER — OXYCODONE HCL 5 MG PO TABS
5.0000 mg | ORAL_TABLET | ORAL | Status: DC | PRN
  Administered 2017-02-18 – 2017-02-21 (×8): 15 mg via ORAL
  Filled 2017-02-18 (×7): qty 3

## 2017-02-18 MED ORDER — SORBITOL 70 % SOLN: 30.0000 mL | Freq: Every day | Status: DC | PRN

## 2017-02-18 MED ORDER — ONDANSETRON HCL 4 MG/2ML IJ SOLN
4.0000 mg | Freq: Four times a day (QID) | INTRAMUSCULAR | Status: DC | PRN
  Administered 2017-02-19: 4 mg via INTRAVENOUS
  Filled 2017-02-18: qty 2

## 2017-02-18 MED ORDER — OXYCODONE HCL 5 MG PO TABS
ORAL_TABLET | ORAL | Status: AC
  Administered 2017-02-18: 15 mg via ORAL
  Filled 2017-02-18: qty 3

## 2017-02-18 MED ORDER — MIDAZOLAM HCL 5 MG/5ML IJ SOLN
INTRAMUSCULAR | Status: DC | PRN
  Administered 2017-02-18: 2 mg via INTRAVENOUS

## 2017-02-18 MED ORDER — MORPHINE SULFATE (PF) 4 MG/ML IV SOLN
1.0000 mg | INTRAVENOUS | Status: DC | PRN
  Filled 2017-02-18: qty 1

## 2017-02-18 MED ORDER — 0.9 % SODIUM CHLORIDE (POUR BTL) OPTIME
TOPICAL | Status: DC | PRN
  Administered 2017-02-18: 1000 mL

## 2017-02-18 MED ORDER — SODIUM CHLORIDE 0.9 % IV SOLN: INTRAVENOUS | Status: DC

## 2017-02-18 MED ORDER — METOCLOPRAMIDE HCL 5 MG/ML IJ SOLN: 5.0000 mg | Freq: Three times a day (TID) | INTRAMUSCULAR | Status: DC | PRN

## 2017-02-18 MED ORDER — PROMETHAZINE HCL 25 MG/ML IJ SOLN: 6.2500 mg | INTRAMUSCULAR | Status: DC | PRN

## 2017-02-18 MED ORDER — HYDROMORPHONE HCL 1 MG/ML IJ SOLN
INTRAMUSCULAR | Status: AC
  Administered 2017-02-18: 0.5 mg via INTRAVENOUS
  Filled 2017-02-18: qty 1

## 2017-02-18 MED ORDER — MEPERIDINE HCL 25 MG/ML IJ SOLN: 6.2500 mg | INTRAMUSCULAR | Status: DC | PRN

## 2017-02-18 MED ORDER — ACETAMINOPHEN 325 MG PO TABS: 650.0000 mg | ORAL_TABLET | Freq: Four times a day (QID) | ORAL | Status: DC | PRN

## 2017-02-18 MED ORDER — DIPHENHYDRAMINE HCL 12.5 MG/5ML PO ELIX: 25.0000 mg | ORAL_SOLUTION | ORAL | Status: DC | PRN

## 2017-02-18 MED ORDER — ACETAMINOPHEN 650 MG RE SUPP: 650.0000 mg | Freq: Four times a day (QID) | RECTAL | Status: DC | PRN

## 2017-02-18 MED ORDER — METHOCARBAMOL 500 MG PO TABS
ORAL_TABLET | ORAL | Status: AC
  Administered 2017-02-18: 500 mg via ORAL
  Filled 2017-02-18: qty 1

## 2017-02-18 MED ORDER — FENTANYL CITRATE (PF) 250 MCG/5ML IJ SOLN
INTRAMUSCULAR | Status: AC
  Filled 2017-02-18: qty 5

## 2017-02-18 MED ORDER — METHOCARBAMOL 500 MG PO TABS
500.0000 mg | ORAL_TABLET | Freq: Four times a day (QID) | ORAL | Status: DC | PRN
  Administered 2017-02-18 – 2017-02-20 (×3): 500 mg via ORAL
  Filled 2017-02-18 (×2): qty 1

## 2017-02-18 MED ORDER — PHENYLEPHRINE 40 MCG/ML (10ML) SYRINGE FOR IV PUSH (FOR BLOOD PRESSURE SUPPORT)
PREFILLED_SYRINGE | INTRAVENOUS | Status: AC
  Filled 2017-02-18: qty 20

## 2017-02-18 MED ORDER — LIDOCAINE HCL (CARDIAC) 20 MG/ML IV SOLN
INTRAVENOUS | Status: DC | PRN
  Administered 2017-02-18: 60 mg via INTRAVENOUS

## 2017-02-18 MED ORDER — ONDANSETRON HCL 4 MG/2ML IJ SOLN
INTRAMUSCULAR | Status: AC
  Filled 2017-02-18: qty 4

## 2017-02-18 MED ORDER — ROCURONIUM BROMIDE 100 MG/10ML IV SOLN
INTRAVENOUS | Status: DC | PRN
  Administered 2017-02-18: 40 mg via INTRAVENOUS

## 2017-02-18 MED ORDER — PROPOFOL 10 MG/ML IV BOLUS
INTRAVENOUS | Status: DC | PRN
  Administered 2017-02-18: 130 mg via INTRAVENOUS

## 2017-02-18 MED ORDER — METHOCARBAMOL 1000 MG/10ML IJ SOLN
500.0000 mg | Freq: Four times a day (QID) | INTRAVENOUS | Status: DC | PRN
  Filled 2017-02-18: qty 5

## 2017-02-18 MED ORDER — MIDAZOLAM HCL 2 MG/2ML IJ SOLN
INTRAMUSCULAR | Status: AC
  Filled 2017-02-18: qty 2

## 2017-02-18 MED ORDER — CEFAZOLIN SODIUM-DEXTROSE 2-4 GM/100ML-% IV SOLN
2.0000 g | Freq: Four times a day (QID) | INTRAVENOUS | Status: AC
  Administered 2017-02-18 – 2017-02-19 (×3): 2 g via INTRAVENOUS
  Filled 2017-02-18 (×3): qty 100

## 2017-02-18 MED ORDER — LACTATED RINGERS IV SOLN: INTRAVENOUS | Status: DC

## 2017-02-18 MED ORDER — ROCURONIUM BROMIDE 10 MG/ML (PF) SYRINGE
PREFILLED_SYRINGE | INTRAVENOUS | Status: AC
  Filled 2017-02-18: qty 5

## 2017-02-18 MED ORDER — MAGNESIUM CITRATE PO SOLN: 1.0000 | Freq: Once | ORAL | Status: DC | PRN

## 2017-02-18 SURGICAL SUPPLY — 74 items
BANDAGE ACE 6X5 VEL STRL LF (GAUZE/BANDAGES/DRESSINGS) ×2 IMPLANT
BANDAGE ESMARK 6X9 LF (GAUZE/BANDAGES/DRESSINGS) IMPLANT
BIT DRILL 2.5MM SMALL QC EVOS (BIT) ×1 IMPLANT
BIT DRILL QC 2.5MM SHRT EVO SM (DRILL) ×2 IMPLANT
BLADE CLIPPER SURG (BLADE) IMPLANT
BNDG COHESIVE 6X5 TAN STRL LF (GAUZE/BANDAGES/DRESSINGS) ×4 IMPLANT
BNDG ESMARK 6X9 LF (GAUZE/BANDAGES/DRESSINGS)
COVER SURGICAL LIGHT HANDLE (MISCELLANEOUS) ×2 IMPLANT
DRAPE C-ARM 42X72 X-RAY (DRAPES) ×2 IMPLANT
DRAPE C-ARMOR (DRAPES) ×2 IMPLANT
DRAPE HALF SHEET 40X57 (DRAPES) ×2 IMPLANT
DRAPE IMP U-DRAPE 54X76 (DRAPES) ×8 IMPLANT
DRAPE INCISE IOBAN 66X45 STRL (DRAPES) ×2 IMPLANT
DRAPE ORTHO SPLIT 77X108 STRL (DRAPES) ×4
DRAPE SURG ORHT 6 SPLT 77X108 (DRAPES) ×2 IMPLANT
DRAPE U-SHAPE 47X51 STRL (DRAPES) ×2 IMPLANT
DRILL 2.5MM SMALL QC EVOS (BIT) ×2
DRILL QC 2.5MM SHORT EVOS SM (DRILL) ×4
DRSG MEPILEX BORDER 4X8 (GAUZE/BANDAGES/DRESSINGS) ×2 IMPLANT
DURAPREP 26ML APPLICATOR (WOUND CARE) ×2 IMPLANT
ELECT REM PT RETURN 9FT ADLT (ELECTROSURGICAL) ×2
ELECTRODE REM PT RTRN 9FT ADLT (ELECTROSURGICAL) ×1 IMPLANT
FACESHIELD STD STERILE (MASK) IMPLANT
GAUZE XEROFORM 1X8 LF (GAUZE/BANDAGES/DRESSINGS) IMPLANT
GLOVE BIOGEL PI IND STRL 6.5 (GLOVE) ×1 IMPLANT
GLOVE BIOGEL PI IND STRL 7.5 (GLOVE) ×1 IMPLANT
GLOVE BIOGEL PI INDICATOR 6.5 (GLOVE) ×1
GLOVE BIOGEL PI INDICATOR 7.5 (GLOVE) ×1
GLOVE SKINSENSE NS SZ7.5 (GLOVE) ×3
GLOVE SKINSENSE STRL SZ7.5 (GLOVE) ×3 IMPLANT
GLOVE SURG SS PI 6.5 STRL IVOR (GLOVE) ×2 IMPLANT
GLOVE SURG SS PI 7.0 STRL IVOR (GLOVE) ×2 IMPLANT
GLOVE SURG SYN 7.5  E (GLOVE) ×1
GLOVE SURG SYN 7.5 E (GLOVE) ×1 IMPLANT
GOWN STRL REIN XL XLG (GOWN DISPOSABLE) ×2 IMPLANT
GOWN STRL REUS W/ TWL LRG LVL3 (GOWN DISPOSABLE) ×2 IMPLANT
GOWN STRL REUS W/TWL 2XL LVL3 (GOWN DISPOSABLE) ×2 IMPLANT
GOWN STRL REUS W/TWL LRG LVL3 (GOWN DISPOSABLE) ×4
K-WIRE 1.6 (WIRE) ×6
K-WIRE FX150X1.6XTROC PNT (WIRE) ×3
KIT BASIN OR (CUSTOM PROCEDURE TRAY) ×2 IMPLANT
KIT ROOM TURNOVER OR (KITS) ×2 IMPLANT
KWIRE FX150X1.6XTROC PNT (WIRE) ×3 IMPLANT
MANIFOLD NEPTUNE II (INSTRUMENTS) ×2 IMPLANT
NDL SUT 6 .5 CRC .975X.05 MAYO (NEEDLE) ×1 IMPLANT
NEEDLE MAYO TAPER (NEEDLE) ×2
NS IRRIG 1000ML POUR BTL (IV SOLUTION) ×2 IMPLANT
PACK GENERAL/GYN (CUSTOM PROCEDURE TRAY) ×2 IMPLANT
PAD ABD 8X10 STRL (GAUZE/BANDAGES/DRESSINGS) IMPLANT
PAD ARMBOARD 7.5X6 YLW CONV (MISCELLANEOUS) IMPLANT
PLATE 4H 3.5 POSTERIOR MEDIAL (Screw) ×2 IMPLANT
SCREW CTX 3.5X34MM EVOS (Screw) ×2 IMPLANT
SCREW CTX 3.5X38MM EVOS (Screw) ×2 IMPLANT
SCREW CTX 3.5X48MM EVOS (Screw) ×2 IMPLANT
SCREW CTX 3.5X50MM EVOS (Screw) ×4 IMPLANT
SCREW LOCK 3.5X14MM EVOS (Screw) ×2 IMPLANT
SCREW LOCK 3.5X36MM EVOS (Screw) ×2 IMPLANT
SCREW LOCK 3.5X50MM EVOS (Screw) ×2 IMPLANT
SCREW LOCK 3.5X60MM EVOS (Screw) ×2 IMPLANT
SCREW LOCK 3.5X65MM (Screw) ×2 IMPLANT
SCREW LOCK 3.5X70 ST EVOS (Screw) ×10 IMPLANT
SCREW LOCK 3.5X70MM EVOS (Plate) ×2 IMPLANT
SPONGE GAUZE 4X4 12PLY STER LF (GAUZE/BANDAGES/DRESSINGS) IMPLANT
STAPLER VISISTAT 35W (STAPLE) ×2 IMPLANT
SUCTION FRAZIER TIP 10 FR DISP (SUCTIONS) ×2 IMPLANT
SUT ETHILON 2 0 FS 18 (SUTURE) IMPLANT
SUT ETHILON 3 0 PS 1 (SUTURE) ×4 IMPLANT
SUT PDS AB 0 CT 36 (SUTURE) ×2 IMPLANT
SUT VIC AB 0 CT1 27 (SUTURE) ×4
SUT VIC AB 0 CT1 27XBRD ANBCTR (SUTURE) ×2 IMPLANT
SUT VIC AB 2-0 CT1 27 (SUTURE) ×2
SUT VIC AB 2-0 CT1 TAPERPNT 27 (SUTURE) ×2 IMPLANT
TOWEL OR 17X24 6PK STRL BLUE (TOWEL DISPOSABLE) ×2 IMPLANT
TOWEL OR 17X26 10 PK STRL BLUE (TOWEL DISPOSABLE) ×4 IMPLANT

## 2017-02-18 NOTE — Anesthesia Procedure Notes (Signed)
Procedure Name: Intubation Date/Time: 02/18/2017 1:55 PM Performed by: Ollen Bowl Pre-anesthesia Checklist: Patient identified, Emergency Drugs available, Suction available and Patient being monitored Patient Re-evaluated:Patient Re-evaluated prior to induction Oxygen Delivery Method: Circle system utilized Preoxygenation: Pre-oxygenation with 100% oxygen Induction Type: IV induction Ventilation: Mask ventilation without difficulty Laryngoscope Size: Miller and 2 Grade View: Grade I Tube type: Oral Tube size: 7.5 mm Number of attempts: 1 Airway Equipment and Method: Patient positioned with wedge pillow and Stylet Placement Confirmation: ETT inserted through vocal cords under direct vision,  positive ETCO2 and breath sounds checked- equal and bilateral Secured at: 22 cm Tube secured with: Tape Dental Injury: Teeth and Oropharynx as per pre-operative assessment

## 2017-02-18 NOTE — Discharge Instructions (Signed)
° ° °  1. Change dressings as needed °2. May shower but keep incisions covered and dry °3. Take lovenox to prevent blood clots °4. Take stool softeners as needed °5. Take pain meds as needed ° °

## 2017-02-18 NOTE — Anesthesia Preprocedure Evaluation (Addendum)
Anesthesia Evaluation  Patient identified by MRN, date of birth, ID band Patient awake    Reviewed: Allergy & Precautions, NPO status , Patient's Chart, lab work & pertinent test results  Airway Mallampati: I  TM Distance: >3 FB Neck ROM: Full    Dental  (+) Partial Upper, Dental Advisory Given   Pulmonary neg pulmonary ROS,    breath sounds clear to auscultation       Cardiovascular hypertension, Pt. on medications  Rhythm:Regular Rate:Normal     Neuro/Psych negative neurological ROS     GI/Hepatic negative GI ROS, (+) Hepatitis -, C  Endo/Other  negative endocrine ROS  Renal/GU negative Renal ROS     Musculoskeletal negative musculoskeletal ROS (+)   Abdominal   Peds  Hematology negative hematology ROS (+)   Anesthesia Other Findings Day of surgery medications reviewed with the patient.  Reproductive/Obstetrics                            EKG: normal sinus rhythm.   Anesthesia Physical Anesthesia Plan  ASA: III  Anesthesia Plan: General   Post-op Pain Management:    Induction: Intravenous  PONV Risk Score and Plan: 4 or greater and Ondansetron, Dexamethasone, Midazolam, Scopolamine patch - Pre-op and Treatment may vary due to age or medical condition  Airway Management Planned: Oral ETT  Additional Equipment:   Intra-op Plan:   Post-operative Plan: Extubation in OR  Informed Consent: I have reviewed the patients History and Physical, chart, labs and discussed the procedure including the risks, benefits and alternatives for the proposed anesthesia with the patient or authorized representative who has indicated his/her understanding and acceptance.   Dental advisory given  Plan Discussed with: CRNA  Anesthesia Plan Comments:         Anesthesia Quick Evaluation

## 2017-02-18 NOTE — H&P (Signed)

## 2017-02-18 NOTE — Transfer of Care (Signed)
Immediate Anesthesia Transfer of Care Note  Patient: Delma Drone  Procedure(s) Performed: OPEN REDUCTION INTERNAL FIXATION (ORIF) RIGHT TIBIAL PLATEAU (Right Knee)  Patient Location: PACU  Anesthesia Type:General  Level of Consciousness: awake, alert  and oriented  Airway & Oxygen Therapy: Patient Spontanous Breathing and Patient connected to nasal cannula oxygen  Post-op Assessment: Report given to RN, Post -op Vital signs reviewed and stable and Patient moving all extremities  Post vital signs: Reviewed and stable  Last Vitals:  Vitals:   02/18/17 0400 02/18/17 1606  BP: (!) 143/97   Pulse: 80   Resp: 20   Temp: 36.6 C 36.5 C  SpO2: 98%     Last Pain:  Vitals:   02/18/17 1606  TempSrc:   PainSc: 10-Worst pain ever         Complications: No apparent anesthesia complications

## 2017-02-18 NOTE — Op Note (Addendum)
Date of Surgery: 02/18/2017  INDICATIONS: Ms. Totzke is a 66 y.o.-year-old female with a right tibial plateau fracture;  The patient did consent to the procedure after discussion of the risks and benefits.  PREOPERATIVE DIAGNOSIS: Right bicondylar tibial plateau fracture  POSTOPERATIVE DIAGNOSIS: Same.  PROCEDURE: Open reduction internal fixation of bicondylar right tibial plateau fracture  SURGEON: N. Eduard Roux, M.D.  ASSIST: April Green, RNFA.  ANESTHESIA:  general  IV FLUIDS AND URINE: See anesthesia.  ESTIMATED BLOOD LOSS: minimal mL.  IMPLANTS: [employer] 4 hole posterior medial plate; 4 hole EVOS VA locking proximal tibia plate; 2.5 hex for all screws  DRAINS: none  COMPLICATIONS: None.  DESCRIPTION OF PROCEDURE: The patient was brought to the operating room and placed supine on the operating table.  The patient had been signed prior to the procedure and this was documented. The patient had the anesthesia placed by the anesthesiologist.  A time-out was performed to confirm that this was the correct patient, site, side and location. The patient did receive antibiotics prior to the incision and was re-dosed during the procedure as needed at indicated intervals.  A tourniquet was placed.  The patient had the operative extremity prepped and draped in the standard surgical fashion.    I first began on the medial aspect of the proximal tibia. An incision was made on the posterior border of the posterior cortex of the proximal tibia. Dissection was carried through the subcutaneous tissue down to the head as anserine tendons. The tendons were then released and tagged for later repair. Subperiosteal elevation was then performed on the posterior medial aspect of the proximal tibia. Medial head of the gastrocnemius muscle was elevated. The fracture was then exposed. Organized hematoma was removed. The fracture fragment was mobilized and reduced. A buttress plate was then placed  at the posterior medial cortex. A nonlocking screw was placed bicortically at the apex of the fracture in order to buttress the fracture fragment. I was able to create a stable axilla for the fracture. The plate was contoured to the bone. Each screw that we placed had excellent purchase. We placed 3 screws bicortically through the distal portion of the plate. I did not place any raftering screws through this plate in order to allow screw placement from the lateral plate. All this was confirmed under fluoroscopy before moving to the lateral side. A separate lateral incision was made over the proximal tibia. There was at least a 8 cm skin bridge between the 2 incisions. Dissection was carried down to the IT band. IT band was sharply incised in line with the incision. This was also then peeled off of Gertie's tubercle. Anterior muscular compartment was incised and the muscles were elevated off of the proximal tibia. I tried to identify the portion of the posterior lateral tibial plateau that was depressed but I could not find a definable fracture line. This was more of a crush type injury. I felt that I did not need to perform an osteotomy to elevate the joint line since there was an acceptable amount of joint depression. The overall alignment of the knee joint was anatomic. I then placed a precontoured proximal locking tibia plate at the appropriate height using fluoroscopic guidance. Nonlocking screws were placed through the distal portion of the plate using standard AO technique each with excellent purchase. We then placed a series of locking raftering screws through the proximal portion of the plate parallel to the joint line. All this was confirmed using  fluoroscopy. The wounds were then thoroughly irrigated and closed in layer fashion using 0 Vicryl, 2-0 Vicryl, staples for the skin. Sterile dressings were applied. Patient tolerated procedure well had no immediate complications.  POSTOPERATIVE PLAN: Patient  will need to be nonweightbearing for 6 weeks.  Azucena Cecil, MD [place] 3:58 PM

## 2017-02-19 LAB — CBC
HCT: 29 % — ABNORMAL LOW (ref 36.0–46.0)
Hemoglobin: 9.5 g/dL — ABNORMAL LOW (ref 12.0–15.0)
MCH: 29.5 pg (ref 26.0–34.0)
MCHC: 32.8 g/dL (ref 30.0–36.0)
MCV: 90.1 fL (ref 78.0–100.0)
Platelets: 193 10*3/uL (ref 150–400)
RBC: 3.22 MIL/uL — ABNORMAL LOW (ref 3.87–5.11)
RDW: 13.9 % (ref 11.5–15.5)
WBC: 6.1 10*3/uL (ref 4.0–10.5)

## 2017-02-19 LAB — BASIC METABOLIC PANEL
Anion gap: 9 (ref 5–15)
BUN: 12 mg/dL (ref 6–20)
CO2: 28 mmol/L (ref 22–32)
Calcium: 8.6 mg/dL — ABNORMAL LOW (ref 8.9–10.3)
Chloride: 100 mmol/L — ABNORMAL LOW (ref 101–111)
Creatinine, Ser: 0.77 mg/dL (ref 0.44–1.00)
GFR calc Af Amer: >60 mL/min (ref >60)
GFR calc non Af Amer: >60 mL/min (ref >60)
Glucose, Bld: 132 mg/dL — ABNORMAL HIGH (ref 65–99)
Potassium: 3.5 mmol/L (ref 3.5–5.1)
Sodium: 137 mmol/L (ref 135–145)

## 2017-02-19 NOTE — Progress Notes (Signed)
Patient ID: Vanessa Beard, female   DOB: [birth year], 66 y.o.   MRN: [id number]                                                                PROGRESS NOTE                                                                                                                                                                                                             Patient Demographics:    Vanessa Beard, is a 66 y.o. female, DOB - [birth year], TKZ:[id number]  Admit date - 02/15/2017   Admitting Physician Jani Gravel, MD  Outpatient Primary MD for the patient is Seward Carol, MD  LOS - 4  Outpatient Specialists: Frankey Shown (orthopedics)   Chief Complaint  Patient presents with  . Knee Pain       Brief Narrative  66 y.o. female, w Hypertension, HIV, Hepatitis C, Hyperparathyroidism, Osteoporosis [place] and when she was walking stumbled and fell onto her right knee. No syncope. Pt went to ED and it was too busy so she has been putting compresses, and keeping elevated.  Pt couldn't walk and therefore presented to ED at [facility]  In ED, CT scan => IMPRESSION: 1. Comminuted fracture of the proximal tibial metaphysis and epiphysis with multiple locations of intra-articular extension and mild posterior depression of the posterior fragments medially and laterally. This involves the tibial spines with proximally displaced tibial spine fragments. 2. Essentially nondisplaced vertical fracture of the fibular head.3. Small effusion.  Pt will be admitted for R tibial fracture.     Subjective:    Vanessa Beard today has has been doing well, pain is controlled. PT evaluated the patient,  Showed her exercises to strengthen calf.   No headache, No chest pain, No abdominal pain - No Nausea, No new weakness tingling or numbness, No Cough - SOB.    Assessment  & Plan :    Active Problems:   Human immunodeficiency virus (HIV) disease ([place])  Osteopenia   Anemia   Benign hypertension   Closed bicondylar fracture of right tibial plateau    R prox Tibial fracture S/p Open reduction internal fixation of bicondylar right tibial plateau fracture 02/18/2017 POD #1, appreciate orthopedic input  Osteopenia/porosis Bone density as  outpatient please by PCP  Hypertension Continue amlodipine Hydralazine 10mg  iv q6h prn sbp >160  Anemia Stable  HIV/HEPC Cont current anti retroviral       Code Status : FULL CODE  Family Communication  :  D/w patient  Disposition Plan  : home with home PT  Barriers For Discharge :   Consults  :  PT/OT  Procedures  : ORIF of bicondylar right tibial plateau fracture  DVT Prophylaxis  :  Lovenox -  SCDs   Lab Results  Component Value Date   PLT 193 02/19/2017    Antibiotics  :    Anti-infectives    Start     Dose/Rate Route Frequency Ordered Stop   02/18/17 2000  ceFAZolin (ANCEF) IVPB 2g/100 mL premix     2 g 200 mL/hr over 30 Minutes Intravenous Every 6 hours 02/18/17 1808 02/19/17 0802   02/18/17 1200  ceFAZolin (ANCEF) IVPB 2g/100 mL premix    Comments:  Anesthesia to give preop   2 g 200 mL/hr over 30 Minutes Intravenous  Once 02/17/17 2346 02/18/17 1400   02/16/17 1000  abacavir-dolutegravir-lamiVUDine (TRIUMEQ) 600-50-300 MG per tablet 1 tablet     1 tablet Oral Daily 02/15/17 2327          Objective:   Vitals:   02/19/17 0146 02/19/17 0437 02/19/17 1318 02/19/17 2028  BP: (!) 143/82 137/83 129/79 137/74  Pulse: 71 74  81  Resp: 16 16 17 18   Temp: 98.3 F (36.8 C) 97.9 F (36.6 C) 98.7 F (37.1 C) 99.1 F (37.3 C)  TempSrc: Oral Oral Oral Oral  SpO2: 93% 93% 98% 95%  Weight:      Height:        Wt Readings from Last 3 Encounters:  02/18/17 65.8 kg (145 lb)  01/26/17 68 kg (150 lb)  01/14/17 64.9 kg (143 lb)     Intake/Output Summary (Last 24 hours) at 02/19/17 2042 Last data filed at 02/19/17 1515  Gross per 24 hour  Intake              3096 ml  Output                0 ml  Net             3096 ml     Physical Exam  Awake Alert, Oriented X 3, No new F.N deficits, Normal affect [place].AT,PERRAL Supple Neck,No JVD, No cervical lymphadenopathy appriciated.  Symmetrical Chest wall movement, Good air movement bilaterally, CTAB RRR,No Gallops,Rubs or new Murmurs, No Parasternal Heave +ve B.Sounds, Abd Soft, No tenderness, No organomegaly appriciated, No rebound - guarding or rigidity. No Cyanosis, Clubbing or edema, No new Rash or bruise   Right leg in wrap, good pulses     Data Review:    CBC  Recent Labs Lab 02/15/17 2023 02/16/17 0636 02/18/17 1932 02/19/17 0505  WBC 4.4 4.1 9.6 6.1  HGB 10.5* 9.8* 11.3* 9.5*  HCT 31.6* 30.1* 35.0* 29.0*  PLT 179 167 254 193  MCV 88.8 89.1 90.0 90.1  MCH 29.5 29.0 29.0 29.5  MCHC 33.2 32.6 32.3 32.8  RDW 14.1 13.9 13.7 13.9  LYMPHSABS 1.2  --   --   --   MONOABS 0.3  --   --   --   EOSABS 0.1  --   --   --   BASOSABS 0.0  --   --   --     Chemistries   Recent Labs Lab  02/15/17 2023 02/16/17 0636 02/17/17 0435 02/18/17 1932 02/19/17 0505  NA 137 139 138  --  137  K 3.5 3.1* 3.3*  --  3.5  CL 109 110 105  --  100*  CO2 22 25 28   --  28  GLUCOSE 97 93 95  --  132*  BUN 14 11 11   --  12  CREATININE 0.80 0.75 0.74 0.74 0.77  CALCIUM 9.2 8.5* 9.0  --  8.6*  AST  --  26  --   --   --   ALT  --  15  --   --   --   ALKPHOS  --  57  --   --   --   BILITOT  --  1.0  --   --   --    ------------------------------------------------------------------------------------------------------------------ No results for input(s): CHOL, HDL, LDLCALC, TRIG, CHOLHDL, LDLDIRECT in the last 72 hours.  No results found for: HGBA1C ------------------------------------------------------------------------------------------------------------------ No results for input(s): TSH, T4TOTAL, T3FREE, THYROIDAB in the last 72 hours.  Invalid input(s):  FREET3 ------------------------------------------------------------------------------------------------------------------ No results for input(s): VITAMINB12, FOLATE, FERRITIN, TIBC, IRON, RETICCTPCT in the last 72 hours.  Coagulation profile No results for input(s): INR, PROTIME in the last 168 hours.  No results for input(s): DDIMER in the last 72 hours.  Cardiac Enzymes No results for input(s): CKMB, TROPONINI, MYOGLOBIN in the last 168 hours.  Invalid input(s): CK ------------------------------------------------------------------------------------------------------------------ No results found for: BNP  Inpatient Medications  Scheduled Meds: . abacavir-dolutegravir-lamiVUDine  1 tablet Oral Daily  . amLODipine  10 mg Oral Daily  . Chlorhexidine Gluconate Cloth  6 each Topical Q0600  . enoxaparin (LOVENOX) injection  40 mg Subcutaneous Q24H  . meloxicam  15 mg Oral Q breakfast  . multivitamin with minerals  1 tablet Oral Daily  . mupirocin ointment  1 application Nasal BID   Continuous Infusions: . sodium chloride 125 mL/hr at 02/18/17 1815  . lactated ringers 10 mL/hr at 02/18/17 1152  . methocarbamol (ROBAXIN)  IV     PRN Meds:.acetaminophen **OR** acetaminophen, acetaminophen **OR** acetaminophen, diphenhydrAMINE, hydrALAZINE, magnesium citrate, methocarbamol **OR** methocarbamol (ROBAXIN)  IV, metoCLOPramide **OR** metoCLOPramide (REGLAN) injection, morphine injection, morphine injection, ondansetron **OR** ondansetron (ZOFRAN) IV, oxyCODONE, polyethylene glycol, sorbitol, traMADol, zolpidem  Micro Results Recent Results (from the past 240 hour(s))  Surgical PCR screen     Status: Abnormal   Collection Time: 02/15/17 11:51 PM  Result Value Ref Range Status   MRSA, PCR POSITIVE (A) NEGATIVE Final    Comment: RESULT CALLED TO, READ BACK BY AND VERIFIED WITH: RN SHERRY WORTH [phone] @0420  THANEY    Staphylococcus aureus POSITIVE (A) NEGATIVE Final    Comment:  (NOTE) The Xpert SA Assay (FDA approved for NASAL specimens in patients 57 years of age and older), is one component of a comprehensive surveillance program. It is not intended to diagnose infection nor to guide or monitor treatment.     Radiology Reports Dg Chest 2 View  Result Date: 02/15/2017 CLINICAL DATA:  Pre-op chest exam; no chest complaints. Hx of HTN. Pt is a former smoker. Pt is having right knee surgery tomorrow. EXAM: CHEST  2 VIEW COMPARISON:  11/12/2015 FINDINGS: Normal heart size and pulmonary vascularity. Areas of linear scarring in the left lung and right mid lung similar to previous study. No developing consolidation or edema. No blunting of costophrenic angles. No pneumothorax. Tortuous aorta. Old resection or resorption of the distal right clavicle. Degenerative changes in the spine. IMPRESSION: Linear scarring in  both lungs is unchanged since prior study. No evidence of active consolidation. Tortuous aorta. Electronically Signed   By: Lucienne Capers M.D.   On: 02/15/2017 22:42   Dg Knee 1-2 Views Right  Result Date: 02/18/2017 CLINICAL DATA:  Internal reduction and fixation of complex proximal tibial fractures. EXAM: RIGHT KNEE - 1-2 VIEW; DG C-ARM 61-120 MIN COMPARISON:  CT scan 02/15/2017 FINDINGS: Medial and lateral plate and screws transfixing complex comminuted tibial plateau fractures with anatomic reduction and no complicating features. IMPRESSION: Anatomic reduction and internal fixation of complex comminuted tibial plateau fractures. Electronically Signed   By: Marijo Sanes M.D.   On: 02/18/2017 15:57   Ct Knee Right Wo Contrast  Result Date: 02/15/2017 CLINICAL DATA:  Comminuted right tibial metaphysis and epiphysis fracture on radiographs earlier today. There is mild depression of the lateral tibial plateau. The patient fell on the knee 5 days ago. EXAM: CT OF THE RIGHT KNEE WITHOUT CONTRAST TECHNIQUE: Multidetector CT imaging of the right knee was performed  according to the standard protocol. Multiplanar CT image reconstructions were also generated. COMPARISON:  Right knee radiographs obtained earlier today. FINDINGS: Bones/Joint/Cartilage Previously demonstrated comminuted fracture of the proximal tibial metaphysis and epiphysis with multiple locations of intra-articular extension. This includes the tibial spines, with proximally displaced fragments of the tibial spines. There is 4 mm of depression of the posterior aspect of the larger posterior fragment of the lateral tibial plateau fracture. There is also 1 mm of depression of the posterior fragment of the medial tibial plateau fracture. Also demonstrated is an essentially nondisplaced vertical fracture of the fibular head. Ligaments Suboptimally assessed by CT. Muscles and Tendons Appear intact. Soft tissues Mild subcutaneous edema.  Small effusion. IMPRESSION: 1. Comminuted fracture of the proximal tibial metaphysis and epiphysis with multiple locations of intra-articular extension and mild posterior depression of the posterior fragments medially and laterally. This involves the tibial spines with proximally displaced tibial spine fragments. 2. Essentially nondisplaced vertical fracture of the fibular head. 3. Small effusion. Electronically Signed   By: Claudie Revering M.D.   On: 02/15/2017 18:58   Dg Knee Complete 4 Views Right  Result Date: 02/15/2017 CLINICAL DATA:  Anterior, medial posterior right knee pain after falling on the knee. EXAM: RIGHT KNEE - COMPLETE 4+ VIEW COMPARISON:  None. FINDINGS: Comminuted fracture of the proximal tibial metaphysis and epiphysis, with multiple sites of intra-articular extension. There is some depression of the lateral tibial plateau. Small to moderate-sized effusion. Probable essentially nondisplaced fracture of the posterior aspect of the fibular head. IMPRESSION: 1. Comminuted proximal tibial metaphysis and epiphysis fracture with multiple sites of intra-articular  extension and mild depression of the lateral tibial plateau. This could be better defined with CT. 2. Probable nondisplaced fibular head fracture. 3. Small to moderate-sized effusion. Electronically Signed   By: Claudie Revering M.D.   On: 02/15/2017 16:52   Dg C-arm 61-120 Min  Result Date: 02/18/2017 CLINICAL DATA:  Internal reduction and fixation of complex proximal tibial fractures. EXAM: RIGHT KNEE - 1-2 VIEW; DG C-ARM 61-120 MIN COMPARISON:  CT scan 02/15/2017 FINDINGS: Medial and lateral plate and screws transfixing complex comminuted tibial plateau fractures with anatomic reduction and no complicating features. IMPRESSION: Anatomic reduction and internal fixation of complex comminuted tibial plateau fractures. Electronically Signed   By: Marijo Sanes M.D.   On: 02/18/2017 15:57    Time Spent in minutes  30   Jani Gravel M.D on 02/19/2017 at 8:42 PM  Between 7am to 7pm -  Pager - [phone]  After 7pm go to [email or web address] - password [email or web address]  [facility] -  Office  [phone]

## 2017-02-19 NOTE — Progress Notes (Signed)
   Subjective:  Patient reports pain as mild.    Objective:   VITALS:   Vitals:   02/18/17 1736 02/18/17 2031 02/19/17 0146 02/19/17 0437  BP: 139/84 124/78 (!) 143/82 137/83  Pulse: 63 62 71 74  Resp: 14 16 16 16   Temp: 97.7 F (36.5 C) 97.6 F (36.4 C) 98.3 F (36.8 C) 97.9 F (36.6 C)  TempSrc: Oral Oral Oral Oral  SpO2: 100% 92% 93% 93%  Weight:      Height:        Neurologically intact Neurovascular intact Sensation intact distally Intact pulses distally Dorsiflexion/Plantar flexion intact Incision: dressing C/D/I and no drainage No cellulitis present Compartment soft   Lab Results  Component Value Date   WBC 6.1 02/19/2017   HGB 9.5 (L) 02/19/2017   HCT 29.0 (L) 02/19/2017   MCV 90.1 02/19/2017   PLT 193 02/19/2017     Assessment/Plan:  1 Day Post-Op   - Expected postop acute blood loss anemia - will monitor for symptoms - Up with PT/OT - DVT ppx - SCDs, ambulation, lovenox while in hospital, aspirin 325 mg bid after discharge - NWB operative extremity, bledsoe brace when out of bed, may leave off when in bed - Pain control - Discharge planning  Eduard Roux 02/19/2017, 7:29 AM [phone]

## 2017-02-19 NOTE — Evaluation (Signed)
Physical Therapy Evaluation Patient Details Name: Vanessa Beard MRN: [id number] DOB: December 31, 1950 Today's Date: 02/19/2017   History of Present Illness  Patient is a 66 y/o female admitted with Right bicondylar tibial plateau fracture, now s/p ORIF.  PMH   Clinical Impression  Patient presents with decreased independence with mobility due to NWB R LE.  Seems as if was managing somewhat prior to surgery with crutches and walker at home.  Will have intermittent assist from son and daughter throughout the day.  She would prefer going home with HHPT.  Will continue skilled PT in the acute setting to ensure safe mobility once her brace arrives and able to negotiate stairs/etc.     Follow Up Recommendations Home health PT;Supervision - Intermittent    Equipment Recommendations  None recommended by PT    Recommendations for Other Services       Precautions / Restrictions Precautions Precautions: Fall Required Braces or Orthoses: Other Brace/Splint Other Brace/Splint: bledsoe brace when OOB Restrictions Weight Bearing Restrictions: Yes RLE Weight Bearing: Non weight bearing      Mobility  Bed Mobility Overal bed mobility: Needs Assistance Bed Mobility: Supine to Sit     Supine to sit: Supervision;HOB elevated     General bed mobility comments: noted able to manage R LE with UE's to get to EOB  Transfers Overall transfer level: Needs assistance Equipment used: Rolling walker (2 wheeled) Transfers: Sit to/from [facility] Sit to Stand: Min assist Stand pivot transfers: Min assist       General transfer comment: used RW, cues for safety with hand placement, and assist with balance. Able to maintain NWB  Ambulation/Gait             General Gait Details: NT due to not having Bledsoe brace yet  Stairs            Wheelchair Mobility    Modified Rankin (Stroke Patients Only)       Balance Overall balance assessment: Needs assistance    Sitting balance-Leahy Scale: Good     Standing balance support: Bilateral upper extremity supported Standing balance-Leahy Scale: Poor Standing balance comment: UE support needed due to NWB                             Pertinent Vitals/Pain Pain Assessment: 0-10 Pain Score: 5  Pain Location: R knee Pain Descriptors / Indicators: Operative site guarding Pain Intervention(s): Monitored during session    Home Living Family/patient expects to be discharged to:: Private residence Living Arrangements: Alone Available Help at Discharge: Family;Available PRN/intermittently Type of Home: Apartment (town house) Home Access: Stairs to enter Entrance Stairs-Rails: None Technical brewer of Steps: 2 Home Layout: Two level;Able to live on main level with bedroom/bathroom;1/2 bath on main level Home Equipment: Walker - 2 wheels;Crutches;Tub bench;Hand held shower head Additional Comments: Pt has "totes" in halfbath; prefers to live upstairs    Prior Function Level of Independence: Independent         Comments: worked as a Dispensing optician   Dominant Hand: Right    Extremity/Trunk Assessment   Upper Extremity Assessment Upper Extremity Assessment: Overall WFL for tasks assessed    Lower Extremity Assessment Lower Extremity Assessment: RLE deficits/detail RLE Deficits / Details: able to move ankle, but did not test knee ROM due to pain and no bledsoe brace yet. utilized knee immobilizer for up to chair only  Communication   Communication: No difficulties  Cognition Arousal/Alertness: Awake/alert                                            General Comments      Exercises Total Joint Exercises Ankle Circles/Pumps: AROM;Both;Seated;10 reps Quad Sets: AROM;Right;5 reps;Seated   Assessment/Plan    PT Assessment Patient needs continued PT services  PT Problem List Decreased strength;Decreased range of motion;Decreased  activity tolerance;Decreased balance;Pain;Decreased knowledge of use of DME;Decreased mobility;Decreased knowledge of precautions       PT Treatment Interventions DME instruction;Therapeutic activities;Therapeutic exercise;Gait training;Patient/family education;Balance training;Stair training;Functional mobility training    PT Goals (Current goals can be found in the Care Plan section)  Acute Rehab PT Goals Patient Stated Goal: To return home PT Goal Formulation: With patient Time For Goal Achievement: 02/23/17 Potential to Achieve Goals: Good    Frequency Min 5X/week   Barriers to discharge        Co-evaluation               AM-PAC PT "6 Clicks" Daily Activity  Outcome Measure Difficulty turning over in bed (including adjusting bedclothes, sheets and blankets)?: None Difficulty moving from lying on back to sitting on the side of the bed? : A Little Difficulty sitting down on and standing up from a chair with arms (e.g., wheelchair, bedside commode, etc,.)?: Unable Help needed moving to and from a bed to chair (including a wheelchair)?: A Little Help needed walking in hospital room?: A Lot Help needed climbing 3-5 steps with a railing? : A Lot 6 Click Score: 15    End of Session Equipment Utilized During Treatment: Gait belt;Right knee immobilizer Activity Tolerance: Patient tolerated treatment well Patient left: in chair;with call bell/phone within reach;with chair alarm set   PT Visit Diagnosis: Other abnormalities of gait and mobility (R26.89);History of falling (Z91.81);Difficulty in walking, not elsewhere classified (R26.2);Pain Pain - Right/Left: Right Pain - part of body: Knee    Time: 1025-8527 PT Time Calculation (min) (ACUTE ONLY): 26 min   Charges:   PT Evaluation $PT Eval Moderate Complexity: 1 Mod PT Treatments $Therapeutic Activity: 8-22 mins   PT G CodesMagda Beard, [place] [phone] 02/19/2017   Vanessa Beard 02/19/2017, 12:15 PM

## 2017-02-19 NOTE — Anesthesia Postprocedure Evaluation (Signed)
Anesthesia Post Note  Patient: Vanessa Beard  Procedure(s) Performed: OPEN REDUCTION INTERNAL FIXATION (ORIF) RIGHT TIBIAL PLATEAU (Right Knee)     Patient location during evaluation: PACU Anesthesia Type: General Level of consciousness: awake and alert Pain management: pain level controlled Vital Signs Assessment: post-procedure vital signs reviewed and stable Respiratory status: spontaneous breathing, nonlabored ventilation, respiratory function stable and patient connected to nasal cannula oxygen Cardiovascular status: blood pressure returned to baseline and stable Postop Assessment: no apparent nausea or vomiting Anesthetic complications: no    Last Vitals:  Vitals:   02/19/17 0437 02/19/17 1318  BP: 137/83 129/79  Pulse: 74   Resp: 16 17  Temp: 36.6 C 37.1 C  SpO2: 93% 98%    Last Pain:  Vitals:   02/19/17 1318  TempSrc: Oral  PainSc:                  Maygan Koeller DAVID

## 2017-02-19 NOTE — Progress Notes (Signed)
Pt's IV fell out. MD states no need to put in another IV.

## 2017-02-19 NOTE — Progress Notes (Signed)
OT Note - Addendum    02/19/17 1500  OT Visit Information  Last OT Received On 02/19/17  OT Time Calculation  OT Start Time (ACUTE ONLY) 1200  OT Stop Time (ACUTE ONLY) 1229  OT Time Calculation (min) 29 min  OT Evaluation  $OT Eval Moderate Complexity 1 Mod  OT Treatments  $Self Care/Home Management  8-22 mins  [facility], OT/L  [phone] 02/19/2017

## 2017-02-19 NOTE — Therapy (Signed)
Occupational Therapy Evaluation Patient Details Name: Vanessa Beard MRN: [id number] DOB: 03-Apr-1951 Today's Date: 02/19/2017    History of Present Illness Patient is a 66 y/o female admitted with Right bicondylar tibial plateau fracture, now s/p ORIF.  PMH    Clinical Impression   Pt reports being independent in all ADLs, IADLs, takes online classes and was working full time PTA. Currently, pt requires min assist for functional mobility at RW level, transfers, and LB dressing and is supervision/set up for all other ADLs. Pt reports that she lives alone but that her children are available to assist intermittently. Pt appears to have been managing RLE at home prior to surgery. Pt would benefit from acute OT services to increase independence with ADLs and ensure safe d/c home. OT will follow acutely to address established goals.     Follow Up Recommendations  Home health OT;Supervision - Intermittent ((pt declining HHOT) )    Equipment Recommendations     Recommendations for Other Services       Precautions / Restrictions Precautions Precautions: Fall Required Braces or Orthoses: Other Brace/Splint (Bledsoe Brace - when OOB) Other Brace/Splint: Bledsoe brace - on when OOB Restrictions Weight Bearing Restrictions: Yes RLE Weight Bearing: Non weight bearing      Mobility Bed Mobility Overal bed mobility: Needs Assistance          General bed mobility comments: Pt sitting in chair upon arrival.  Transfers Overall transfer level: Needs assistance Equipment used: Rolling walker (2 wheeled) Transfers: Sit to/from Stand Sit to Stand: Min assist Stand pivot transfers: Min assist       General transfer comment: Min assist to maintain balance with RW. Pt able to maintain NWB     Balance Overall balance assessment: Needs assistance Sitting-balance support: No upper extremity supported;Feet supported Sitting balance-Leahy Scale: Good     Standing balance support:  Bilateral upper extremity supported;During functional activity Standing balance-Leahy Scale: Fair Standing balance comment: Pt able to stand and complete peri care with single UE support and able to maintain NWB                           ADL either performed or assessed with clinical judgement   ADL Overall ADL's : Needs assistance/impaired     Grooming: Supervision/safety;Set up;Sitting   Upper Body Bathing: Supervision/ safety;Set up;Sitting   Lower Body Bathing: Supervison/ safety;Set up;With adaptive equipment;Sitting/lateral leans   Upper Body Dressing : Supervision/safety;Set up;Sitting   Lower Body Dressing: Minimal assistance;Sit to/from stand   Toilet Transfer: Minimal assistance;Ambulation;BSC;RW Toilet Transfer Details (indicate cue type and reason): Pt able to complete toilet transfer to [facility] using a RW with min assist for balance. Pt demonstrated good adherence to NWB on RLE  Toileting- Clothing Manipulation and Hygiene: Minimal assistance;Sit to/from stand;Set up [place] Details (indicate cue type and reason): Min assist to stand/balance while completing pericare. Set up assist for peri care    Functional mobility during ADLs: Minimal assistance;Rolling walker       Vision         Perception     Praxis      Pertinent Vitals/Pain Pain Assessment: 0-10 Pain Score: 7  Pain Location: R knee Pain Descriptors / Indicators: Operative site guarding     Hand Dominance Right   Extremity/Trunk Assessment Upper Extremity Assessment Upper Extremity Assessment: Overall WFL for tasks assessed   Lower Extremity Assessment Lower Extremity Assessment: Defer to PT evaluation RLE Deficits /  Details:    Cervical / Trunk Assessment Cervical / Trunk Assessment: Normal   Communication Communication Communication: No difficulties   Cognition Arousal/Alertness: Awake/alert Behavior During Therapy: WFL for tasks  assessed/performed Overall Cognitive Status: Within Functional Limits for tasks assessed                                     General Comments  Pt eager to return to PLOF. She reports being very independent, takes online classes and works full time. OT recommended HHOT but pt declined. Pt reports that she will be able to manage once she is home.     Exercises Pt educated on dorsi flexion with sheet for RLE.    Shoulder Instructions      Home Living Family/patient expects to be discharged to:: Private residence Living Arrangements: Alone Available Help at Discharge: Family;Available PRN/intermittently Type of Home: Apartment (town house) Home Access: Stairs to enter [employer] of Steps: 2 Entrance Stairs-Rails: None Home Layout: Two level;Able to live on main level with bedroom/bathroom;1/2 bath on main level Alternate Level Stairs-Number of Steps: flight Alternate Level Stairs-Rails: Left Bathroom Shower/Tub: Tub/shower unit;Curtain   Bathroom Toilet: Standard Bathroom Accessibility: Yes How Accessible: Accessible via walker Home Equipment: Walker - 2 wheels;Crutches;Tub bench;Hand held shower head   Additional Comments: Pt has "totes" in halfbath; prefers to live upstairs      Prior Functioning/Environment Level of Independence: Independent        Comments: worked as a Visual merchandiser Problem List: Impaired balance (sitting and/or standing);Decreased safety awareness;Decreased knowledge of use of DME or AE;Pain      OT Treatment/Interventions: Self-care/ADL training;DME and/or AE instruction;Therapeutic activities;Patient/family education;Balance training    OT Goals(Current goals can be found in the care plan section) Acute Rehab OT Goals Patient Stated Goal: To return home OT Goal Formulation: With patient Time For Goal Achievement: 03/05/17 Potential to Achieve Goals: Good ADL Goals Pt Will Perform Lower Body Bathing: with  modified independence;sitting/lateral leans (AE as needed) Pt Will Perform Lower Body Dressing: with modified independence;sit to/from stand (AE as needed) Pt Will Perform Tub/Shower Transfer: Tub transfer;with modified independence;ambulating;tub bench;rolling walker  OT Frequency: Min 2X/week   Barriers to D/C:            Co-evaluation              AM-PAC PT "6 Clicks" Daily Activity     Outcome Measure Help from another person eating meals?: None Help from another person taking care of personal grooming?: None  Help from another person toileting, which includes using toliet, bedpan, or urinal?: A Little Help from another person bathing (including washing, rinsing, drying)?: A Little Help from another person to put on and taking off regular upper body clothing?: None Help from another person to put on and taking off regular lower body clothing?: A Little 6 Click Score: 20   End of Session Equipment Utilized During Treatment: Gait belt;Rolling walker Nurse Communication: Mobility status  Activity Tolerance: Patient tolerated treatment well Patient left: in chair;with call bell/phone within reach;with chair alarm set  OT Visit Diagnosis: Unsteadiness on feet (R26.81);Pain Pain - Right/Left: Right Pain - part of body: Leg                Time: 1200-1229 OT Time Calculation (min): 29 min Charges:    G-Codes:    Boykin Peek, OTS #  [place] 02/19/2017, 2:21 PM

## 2017-02-19 NOTE — Progress Notes (Signed)
Orthopedic Tech Progress Note Patient Details:  Vanessa Beard [birth year] [id number]  Patient ID: [name], female   DOB: 1951-04-14, 66 y.o.   MRN: [id number]   Hildred Priest 02/19/2017, 9:13 AM Called in bio-tech brace order; spoke with [facility]

## 2017-02-20 ENCOUNTER — Encounter (HOSPITAL_COMMUNITY): Payer: Self-pay | Admitting: Orthopaedic Surgery

## 2017-02-20 LAB — BASIC METABOLIC PANEL
Anion gap: 4 — ABNORMAL LOW (ref 5–15)
BUN: 10 mg/dL (ref 6–20)
CO2: 28 mmol/L (ref 22–32)
Calcium: 8.7 mg/dL — ABNORMAL LOW (ref 8.9–10.3)
Chloride: 100 mmol/L — ABNORMAL LOW (ref 101–111)
Creatinine, Ser: 0.67 mg/dL (ref 0.44–1.00)
GFR calc Af Amer: >60 mL/min (ref >60)
GFR calc non Af Amer: >60 mL/min (ref >60)
Glucose, Bld: 108 mg/dL — ABNORMAL HIGH (ref 65–99)
Potassium: 3.1 mmol/L — ABNORMAL LOW (ref 3.5–5.1)
Sodium: 132 mmol/L — ABNORMAL LOW (ref 135–145)

## 2017-02-20 MED ORDER — POLYETHYLENE GLYCOL 3350 17 G PO PACK: 17.0000 g | PACK | Freq: Every day | ORAL | 0 refills | Status: DC | PRN

## 2017-02-20 MED ORDER — OXYCODONE HCL 5 MG PO TABS: 5.0000 mg | ORAL_TABLET | ORAL | 0 refills | Status: DC | PRN

## 2017-02-20 MED ORDER — ASPIRIN 325 MG PO TBEC: 325.0000 mg | DELAYED_RELEASE_TABLET | Freq: Two times a day (BID) | ORAL | 0 refills | Status: DC

## 2017-02-20 MED ORDER — CHLORHEXIDINE GLUCONATE CLOTH 2 % EX PADS: 6.0000 | MEDICATED_PAD | Freq: Every day | CUTANEOUS | 0 refills | Status: DC

## 2017-02-20 MED ORDER — MELOXICAM 15 MG PO TABS: 15.0000 mg | ORAL_TABLET | Freq: Every day | ORAL | 0 refills | Status: DC

## 2017-02-20 MED ORDER — METHOCARBAMOL 500 MG PO TABS: 500.0000 mg | ORAL_TABLET | Freq: Four times a day (QID) | ORAL | 0 refills | Status: DC | PRN

## 2017-02-20 MED ORDER — ASPIRIN EC 325 MG PO TBEC
325.0000 mg | DELAYED_RELEASE_TABLET | Freq: Two times a day (BID) | ORAL | Status: DC
  Administered 2017-02-20 – 2017-02-21 (×3): 325 mg via ORAL
  Filled 2017-02-20 (×3): qty 1

## 2017-02-20 NOTE — Discharge Summary (Signed)
Vanessa Beard, is a 66 y.o. female  DOB 1951/01/01  MRN [id number].  Admission date:  02/15/2017  Admitting Physician  Jani Gravel, MD  Discharge Date:  02/20/2017   Primary MD  Seward Carol, MD  Recommendations for primary care physician for things to follow:   R prox Tibial fracture S/p Open reduction internal fixation of bicondylar right tibial plateau fracture 02/18/2017 F/u with orthopedics as per recommendations in 2 weeks for suture removal Home PT/OT  Osteopenia/porosis Bone density as outpatient please by PCP  Hypertension Continue amlodipine  Anemia Stable Repeat cbc in 1 week by PCP  HIV/HEP C Cont current anti retroviral F/u with ID Baxter Flattery) as scheduled      Admission Diagnosis  Preop cardiovascular exam [Z01.810] Closed fracture of right tibial plateau, initial encounter [S82.141A]   Discharge Diagnosis  Preop cardiovascular exam [Z01.810] Closed fracture of right tibial plateau, initial encounter [S82.141A]    Active Problems:   Human immunodeficiency virus (HIV) disease ([place])   Osteopenia   Anemia   Benign hypertension   Closed bicondylar fracture of right tibial plateau      Past Medical History:  Diagnosis Date  . Allergic rhinitis   . Biliary colic   . Gall bladder disease \  . Hepatitis C   . Herpes   . HIV infection ([place])   . Hyperparathyroidism ([place])   . Hypertension   . Insomnia   . Osteoporosis   . Pneumonia    2010    Past Surgical History:  Procedure Laterality Date  . CHOLECYSTECTOMY N/A 01/09/2015   Procedure: LAPAROSCOPIC CHOLECYSTECTOMY;  Surgeon: [name], MD;  Location: [place];  Service: General;  Laterality: N/A;  . ECTOPIC PREGNANCY SURGERY    . SHOULDER SURGERY Right 12/2014       HPI  from the history and physical done on the day of admission:   66 y.o. female, w Hypertension, HIV, Hepatitis C,  Hyperparathyroidism, Osteoporosis [place] and when she was walking stumbled and fell onto her right knee. No syncope. Pt went to ED and it was too busy so she has been putting compresses, and keeping elevated.  Pt couldn't walk and therefore presented to ED at [facility]  In ED, CT scan => IMPRESSION: 1. Comminuted fracture of the proximal tibial metaphysis and epiphysis with multiple locations of intra-articular extension and mild posterior depression of the posterior fragments medially and laterally. This involves the tibial spines with proximally displaced tibial spine fragments. 2. Essentially nondisplaced vertical fracture of the fibular head.3. Small effusion.  Pt will be admitted for R tibial fracture.       Hospital Course:     Pt was admitted for R tibial plateau fracture and placed in knee imobilizer.  Pt had CXR negative for acute process, K=3.1 repleted and improved to 3.5  And pt appeared stable and was taken to OR on 02/18/2017, for  Open reduction internal fixation of bicondylar right tibial plateau fracture.  Pt has been  doing well postoperatively with expected post op anemia.  Pt was seen by PT who recommended home health PT, as well as OT who recommended home health OT. Pt needs assistance with mobility.  Pt appears stable and will be discharge to home later today if ok with orthopedics.   Follow UP  Follow-up Information    Leandrew Koyanagi, MD Follow up in 2 week(s).   Specialty:  Orthopedic Surgery Why:  For suture removal, For wound re-check Contact information: [place] [address] [phone]        Seward Carol, MD Follow up in 1 week(s).   Specialty:  Internal Medicine Contact information: [address] [place] [address] [phone]        Carlyle Basques, MD Follow up.   Specialty:  Infectious Diseases Contact information: [address] [place]  [place] [address] [phone]            Consults obtained - orthopedics  Discharge Condition: stable  Diet and Activity recommendation: See Discharge Instructions below  Discharge Instructions      Discharge Medications     Allergies as of 02/20/2017      Reactions   Dapsone Nausea Only   stomach  burning   Retrovir [zidovudine] Other (See Comments)   Changed skin color   Sulfamethoxazole-trimethoprim Other (See Comments)   Flu like symptoms      Medication List    STOP taking these medications   ibuprofen 600 MG tablet Commonly known as:  ADVIL,MOTRIN     TAKE these medications   ADULT GUMMY Chew Chew 3 capsules by mouth daily.   amLODipine 10 MG tablet Commonly known as:  NORVASC Take 1 tablet (10 mg total) by mouth daily.   aspirin 325 MG EC tablet Take 1 tablet (325 mg total) by mouth 2 (two) times daily.   Chlorhexidine Gluconate Cloth 2 % Pads Apply 6 each topically daily at 6 (six) AM.   meloxicam 15 MG tablet Commonly known as:  MOBIC Take 1 tablet (15 mg total) by mouth daily with breakfast. What changed:  when to take this   methocarbamol 500 MG tablet Commonly known as:  ROBAXIN Take 1 tablet (500 mg total) by mouth every 6 (six) hours as needed for muscle spasms.   oxyCODONE 5 MG immediate release tablet Commonly known as:  Oxy IR/ROXICODONE Take 1-3 tablets (5-15 mg total) by mouth every 3 (three) hours as needed for breakthrough pain.   polyethylene glycol packet Commonly known as:  MIRALAX / GLYCOLAX Take 17 g by mouth daily as needed for mild constipation.   TRIUMEQ [id number] MG tablet Generic drug:  abacavir-dolutegravir-lamiVUDine TAKE 1 TABLET BY MOUTH DAILY   zolpidem 10 MG tablet Commonly known as:  AMBIEN TAKE 1 TABLET BY MOUTH AT BEDTIME AS NEEDED What changed:  See the new instructions.            Durable Medical Equipment        Start     Ordered   02/18/17 1809  DME Walker rolling  Once    Question:  Patient  needs a walker to treat with the following condition  Answer:  History of open reduction and internal fixation (ORIF) procedure   02/18/17 1808   02/18/17 1809  DME 3 n 1  Once     02/18/17 1808   02/18/17 1809  DME Bedside commode  Once    Question:  Patient needs a bedside commode  to treat with the following condition  Answer:  History of open reduction and internal fixation (ORIF) procedure   02/18/17 1808      Major procedures and Radiology Reports - PLEASE review detailed and final reports for all details, in brief -     Dg Chest 2 View  Result Date: 02/15/2017 CLINICAL DATA:  Pre-op chest exam; no chest complaints. Hx of HTN. Pt is a former smoker. Pt is having right knee surgery tomorrow. EXAM: CHEST  2 VIEW COMPARISON:  11/12/2015 FINDINGS: Normal heart size and pulmonary vascularity. Areas of linear scarring in the left lung and right mid lung similar to previous study. No developing consolidation or edema. No blunting of costophrenic angles. No pneumothorax. Tortuous aorta. Old resection or resorption of the distal right clavicle. Degenerative changes in the spine. IMPRESSION: Linear scarring in both lungs is unchanged since prior study. No evidence of active consolidation. Tortuous aorta. Electronically Signed   By: Lucienne Capers M.D.   On: 02/15/2017 22:42   Dg Knee 1-2 Views Right  Result Date: 02/18/2017 CLINICAL DATA:  Internal reduction and fixation of complex proximal tibial fractures. EXAM: RIGHT KNEE - 1-2 VIEW; DG C-ARM 61-120 MIN COMPARISON:  CT scan 02/15/2017 FINDINGS: Medial and lateral plate and screws transfixing complex comminuted tibial plateau fractures with anatomic reduction and no complicating features. IMPRESSION: Anatomic reduction and internal fixation of complex comminuted tibial plateau fractures. Electronically Signed   By: Marijo Sanes M.D.   On: 02/18/2017 15:57   Ct Knee Right Wo Contrast  Result Date: 02/15/2017 CLINICAL DATA:  Comminuted right  tibial metaphysis and epiphysis fracture on radiographs earlier today. There is mild depression of the lateral tibial plateau. The patient fell on the knee 5 days ago. EXAM: CT OF THE RIGHT KNEE WITHOUT CONTRAST TECHNIQUE: Multidetector CT imaging of the right knee was performed according to the standard protocol. Multiplanar CT image reconstructions were also generated. COMPARISON:  Right knee radiographs obtained earlier today. FINDINGS: Bones/Joint/Cartilage Previously demonstrated comminuted fracture of the proximal tibial metaphysis and epiphysis with multiple locations of intra-articular extension. This includes the tibial spines, with proximally displaced fragments of the tibial spines. There is 4 mm of depression of the posterior aspect of the larger posterior fragment of the lateral tibial plateau fracture. There is also 1 mm of depression of the posterior fragment of the medial tibial plateau fracture. Also demonstrated is an essentially nondisplaced vertical fracture of the fibular head. Ligaments Suboptimally assessed by CT. Muscles and Tendons Appear intact. Soft tissues Mild subcutaneous edema.  Small effusion. IMPRESSION: 1. Comminuted fracture of the proximal tibial metaphysis and epiphysis with multiple locations of intra-articular extension and mild posterior depression of the posterior fragments medially and laterally. This involves the tibial spines with proximally displaced tibial spine fragments. 2. Essentially nondisplaced vertical fracture of the fibular head. 3. Small effusion. Electronically Signed   By: Claudie Revering M.D.   On: 02/15/2017 18:58   Dg Knee Complete 4 Views Right  Result Date: 02/15/2017 CLINICAL DATA:  Anterior, medial posterior right knee pain after falling on the knee. EXAM: RIGHT KNEE - COMPLETE 4+ VIEW COMPARISON:  None. FINDINGS: Comminuted fracture of the proximal tibial metaphysis and epiphysis, with multiple sites of intra-articular extension. There is some  depression of the lateral tibial plateau. Small to moderate-sized effusion. Probable essentially nondisplaced fracture of the posterior aspect of the fibular head. IMPRESSION: 1. Comminuted proximal tibial metaphysis and epiphysis fracture with multiple sites of intra-articular extension and mild depression of  the lateral tibial plateau. This could be better defined with CT. 2. Probable nondisplaced fibular head fracture. 3. Small to moderate-sized effusion. Electronically Signed   By: Claudie Revering M.D.   On: 02/15/2017 16:52   Dg C-arm 61-120 Min  Result Date: 02/18/2017 CLINICAL DATA:  Internal reduction and fixation of complex proximal tibial fractures. EXAM: RIGHT KNEE - 1-2 VIEW; DG C-ARM 61-120 MIN COMPARISON:  CT scan 02/15/2017 FINDINGS: Medial and lateral plate and screws transfixing complex comminuted tibial plateau fractures with anatomic reduction and no complicating features. IMPRESSION: Anatomic reduction and internal fixation of complex comminuted tibial plateau fractures. Electronically Signed   By: Marijo Sanes M.D.   On: 02/18/2017 15:57    Micro Results     Recent Results (from the past 240 hour(s))  Surgical PCR screen     Status: Abnormal   Collection Time: 02/15/17 11:51 PM  Result Value Ref Range Status   MRSA, PCR POSITIVE (A) NEGATIVE Final    Comment: RESULT CALLED TO, READ BACK BY AND VERIFIED WITH: RN SHERRY WORTH [phone] @0420  THANEY    Staphylococcus aureus POSITIVE (A) NEGATIVE Final    Comment: (NOTE) The Xpert SA Assay (FDA approved for NASAL specimens in patients 13 years of age and older), is one component of a comprehensive surveillance program. It is not intended to diagnose infection nor to guide or monitor treatment.        Today   Subjective    Che Below today has been doing well, pain controllled.    No headache,no chest abdominal pain,no new weakness tingling or numbness, feels much better wants to go home today.     Objective   Blood pressure 128/79, pulse 78, temperature 98.8 F (37.1 C), temperature source Oral, resp. rate 17, height 5' (1.524 m), weight 65.8 kg (145 lb), SpO2 96 %.   Intake/Output Summary (Last 24 hours) at 02/20/17 0646 Last data filed at 02/19/17 1515  Gross per 24 hour  Intake             3096 ml  Output                0 ml  Net             3096 ml    Exam Awake Alert, Oriented x 3, No new F.N deficits, Normal affect [place].AT,PERRAL Supple Neck,No JVD, No cervical lymphadenopathy appriciated.  Symmetrical Chest wall movement, Good air movement bilaterally, CTAB RRR,No Gallops,Rubs or new Murmurs, No Parasternal Heave +ve B.Sounds, Abd Soft, Non tender, No organomegaly appriciated, No rebound -guarding or rigidity. No Cyanosis, Clubbing or edema, No new Rash or bruise RLE wrapped.    Data Review   CBC w Diff:  Lab Results  Component Value Date   WBC 6.1 02/19/2017   HGB 9.5 (L) 02/19/2017   HGB 12.7 08/31/2013   HCT 29.0 (L) 02/19/2017   HCT 39.3 08/31/2013   PLT 193 02/19/2017   PLT 156 08/31/2013   LYMPHOPCT 27 02/15/2017   LYMPHOPCT 27.4 08/31/2013   MONOPCT 7 02/15/2017   MONOPCT 7.6 08/31/2013   EOSPCT 2 02/15/2017   EOSPCT 0.9 08/31/2013   BASOPCT 1 02/15/2017   BASOPCT 0.7 08/31/2013    CMP:  Lab Results  Component Value Date   NA 137 02/19/2017   NA 142 08/31/2013   K 3.5 02/19/2017   K 3.4 (L) 08/31/2013   CL 100 (L) 02/19/2017   CL 104 07/07/2012   CO2 28 02/19/2017   CO2  27 08/31/2013   BUN 12 02/19/2017   BUN 14.7 08/31/2013   CREATININE 0.77 02/19/2017   CREATININE 0.91 01/12/2017   CREATININE 0.8 08/31/2013   PROT 7.0 02/16/2017   PROT 8.8 (H) 08/31/2013   ALBUMIN 2.9 (L) 02/16/2017   ALBUMIN 3.7 08/31/2013   BILITOT 1.0 02/16/2017   BILITOT 0.67 08/31/2013   ALKPHOS 57 02/16/2017   ALKPHOS 99 08/31/2013   AST 26 02/16/2017   AST 25 08/31/2013   ALT 15 02/16/2017   ALT 9 08/31/2013  .   Total Time in preparing paper  work, data evaluation and todays exam - 31 minutes  Jani Gravel M.D on 02/20/2017 at 6:46 AM  [facility]   Office  [phone]

## 2017-02-20 NOTE — Progress Notes (Addendum)
Physical Therapy Treatment Patient Details Name: Vanessa Beard MRN: [id number] DOB: January 27, 1951 Today's Date: 02/20/2017    History of Present Illness Patient is a 66 y/o female admitted with Right bicondylar tibial plateau fracture, now s/p ORIF.  PMH     PT Comments    Pt performed limited gait with primary focus on stair training this am.  Pt presents with her bledsoe brace but brace appears too large. Pt only able to negotiate x1 stair due to fear and anxiety and possible lack of strength.  Pt will require pm session to readress stair training.  Based on performance this early afternoon she will require ambulance transport to enter home safely.  Handout issued on stair training with RW.     Follow Up Recommendations  Home health PT;Supervision - Intermittent     Equipment Recommendations  Other (comment) (Based on tx this afternoon patient will require ambulance transport for safe entry into home.  )    Recommendations for Other Services       Precautions / Restrictions Precautions Precautions: Fall Required Braces or Orthoses: Other Brace/Splint (R bledsoe when OOB.  ) Other Brace/Splint: Bledsoe brace - on when OOB Restrictions Weight Bearing Restrictions: Yes RLE Weight Bearing: Non weight bearing    Mobility  Bed Mobility               General bed mobility comments: Pt sitting in chair upon arrival.  Transfers Overall transfer level: Needs assistance Equipment used: Rolling walker (2 wheeled);Crutches Transfers: Sit to/from Stand Sit to Stand: Min assist Stand pivot transfers: Min assist       General transfer comment: Min assist to maintain balance with RW. Pt able to maintain NWB   Ambulation/Gait Ambulation/Gait assistance: Min assist Ambulation Distance (Feet): 1 Feet (Pt performed multiple trials to and from stairs for stair training.  ) Assistive device: Rolling walker (2 wheeled);Crutches Gait Pattern/deviations: Step-to pattern   Gait  velocity interpretation: Below normal speed for age/gender General Gait Details: hop to pattern with flexed trunk.  Pt fatigues quickly.     Stairs Stairs: Yes  min to mod assistance Stair Management: No rails;With walker;With crutches;Backwards (Pt able to successfully complete 1 step backward with RW.  Pt attempted several times with crutches but unable to follow commands for successful advancement and fear of falling when performing technique.  ) Number of Stairs: 1 General stair comments: Cues for sequencing and RW placement.  PTA demonstrated technique with cructhes but patient unable to follow commands and appears fearful of falling.  Pt appears to have a mental block or lacks the strength to perform stair training.    Wheelchair Mobility    Modified Rankin (Stroke Patients Only)       Balance Overall balance assessment: Needs assistance   Sitting balance-Leahy Scale: Good       Standing balance-Leahy Scale: Fair                              Cognition Arousal/Alertness: Awake/alert Behavior During Therapy: WFL for tasks assessed/performed Overall Cognitive Status: Within Functional Limits for tasks assessed                                        Exercises      General Comments        Pertinent Vitals/Pain Pain Assessment: 0-10 Pain Score: 7  Pain Location: R knee Pain Descriptors / Indicators: Operative site guarding Pain Intervention(s): Monitored during session;Repositioned    Home Living                      Prior Function            PT Goals (current goals can now be found in the care plan section) Acute Rehab PT Goals Patient Stated Goal: To return home Potential to Achieve Goals: Good Progress towards PT goals: Progressing toward goals    Frequency    Min 5X/week      PT Plan Discharge plan needs to be updated    Co-evaluation              AM-PAC PT "6 Clicks" Daily Activity  Outcome  Measure  Difficulty turning over in bed (including adjusting bedclothes, sheets and blankets)?: None Difficulty moving from lying on back to sitting on the side of the bed? : A Little Difficulty sitting down on and standing up from a chair with arms (e.g., wheelchair, bedside commode, etc,.)?: Unable Help needed moving to and from a bed to chair (including a wheelchair)?: A Little Help needed walking in hospital room?: A Little Help needed climbing 3-5 steps with a railing? : A Lot 6 Click Score: 16    End of Session Equipment Utilized During Treatment: Gait belt (R bledsoe) Activity Tolerance: Patient limited by fatigue (anxiety limited session.  ) Patient left: in chair;with call bell/phone within reach;with chair alarm set   PT Visit Diagnosis: Other abnormalities of gait and mobility (R26.89);History of falling (Z91.81);Difficulty in walking, not elsewhere classified (R26.2);Pain Pain - Right/Left: Right Pain - part of body: Knee     Time: 4628-6381 PT Time Calculation (min) (ACUTE ONLY): 29 min  Charges:  $Gait Training: 8-22 mins $Therapeutic Activity: 8-22 mins                    G Codes:       Governor Rooks, PTA pager [phone]    Cristela Blue 02/20/2017, 1:22 PM

## 2017-02-20 NOTE — Therapy (Signed)
Occupational Therapy Treatment Patient Details Name: Vanessa Beard MRN: [id number] DOB: [birth year] Today's Date: 02/20/2017    History of present illness Patient is a 66 y/o female admitted with Right bicondylar tibial plateau fracture, now s/p ORIF.  PMH    OT comments  Focus of today's treatment session on tub shower transfer and AE for LB ADLs. Pt complete tub shower transfer with RW and tub shower bench with min assist. Pt able to maintain NWB status during functional mobility. Pt educated on AE for LB ADLs and was able to verbalize understanding. Pt has completed all acute OT education and is safe to d/c home when medically allowed. No further acute OT needs at this time. OT signing off.    Follow Up Recommendations  Home health OT;Supervision - Intermittent    Equipment Recommendations  3 in 1 bedside commode    Recommendations for Other Services      Precautions / Restrictions Precautions Precautions: Fall Required Braces or Orthoses: Other Brace/Splint (R bledsoe when OOB) Other Brace/Splint: Bledsoe brace - on when OOB Restrictions Weight Bearing Restrictions: Yes RLE Weight Bearing: Non weight bearing       Mobility Bed Mobility Overal bed mobility: Needs Assistance Bed Mobility: Supine to Sit     Supine to sit: Supervision     General bed mobility comments:   Transfers Overall transfer level: Needs assistance Equipment used: Rolling walker (2 wheeled) Transfers: Sit to/from Stand Sit to Stand: Min assist Stand pivot transfers: Min assist       General transfer comment: Min assist to maintain balance with RW. Pt able to maintain NWB     Balance Overall balance assessment: Needs assistance Sitting-balance support: No upper extremity supported;Feet supported Sitting balance-Leahy Scale: Good     Standing balance support: Bilateral upper extremity supported;During functional activity Standing balance-Leahy Scale: Fair                              ADL either performed or assessed with clinical judgement   ADL Overall ADL's : Needs assistance/impaired               Lower Body Bathing Details (indicate cue type and reason): Pt educated on use of long handle sponge to increase independence with LB ADLs.        Lower Body Dressing Details (indicate cue type and reason): Pt educated on use of reacher, shoe horn and sock aide to increase independence with LB ADLs. Pt able to don bledsoe in bed with supervision and set up          Tub/ Shower Transfer: Minimal assistance;Ambulation;Tub bench;Rolling walker Tub/Shower Transfer Details (indicate cue type and reason): Pt able to complete tub shower transfer with tub bench while maintaining WBS.  Functional mobility during ADLs: Minimal assistance;Rolling walker       Vision       Perception     Praxis      Cognition Arousal/Alertness: Awake/alert Behavior During Therapy: WFL for tasks assessed/performed Overall Cognitive Status: Within Functional Limits for tasks assessed                                          Exercises     Shoulder Instructions       General Comments      Pertinent Vitals/ Pain  Pain Assessment: No/denies pain Pain Score: 7  Pain Location: R knee Pain Descriptors / Indicators: Operative site guarding Pain Intervention(s): Monitored during session  Home Living                                          Prior Functioning/Environment              Frequency  Min 2X/week        Progress Toward Goals  OT Goals(current goals can now be found in the care plan section)  Progress towards OT goals: Goals met/education completed, patient discharged from OT  Acute Rehab OT Goals Patient Stated Goal: To return home OT Goal Formulation: With patient Time For Goal Achievement: 03/05/17 Potential to Achieve Goals: Good ADL Goals Pt Will Perform Lower Body Bathing: with modified  independence;sitting/lateral leans Pt Will Perform Lower Body Dressing: with modified independence;sit to/from stand Pt Will Perform Tub/Shower Transfer: Tub transfer;with modified independence;ambulating;tub bench;rolling walker  Plan Discharge plan remains appropriate    Co-evaluation                 AM-PAC PT "6 Clicks" Daily Activity     Outcome Measure   Help from another person eating meals?: None Help from another person taking care of personal grooming?: None Help from another person toileting, which includes using toliet, bedpan, or urinal?: A Little Help from another person bathing (including washing, rinsing, drying)?: A Little Help from another person to put on and taking off regular upper body clothing?: None Help from another person to put on and taking off regular lower body clothing?: A Little 6 Click Score: 21    End of Session Equipment Utilized During Treatment: Gait belt;Rolling walker  OT Visit Diagnosis: Unsteadiness on feet (R26.81);Pain Pain - Right/Left: Right Pain - part of body: Leg   Activity Tolerance Patient tolerated treatment well   Patient Left in chair;with call bell/phone within reach;with chair alarm set   Nurse Communication Mobility status        Time: 2023-3435 OT Time Calculation (min): 20 min  Charges: OT General Charges $OT Visit: 1 Visit OT Treatments $Self Care/Home Management : 8-22 mins  Boykin Peek, [place] #[phone]    Boykin Peek 02/20/2017, 1:41 PM

## 2017-02-20 NOTE — Progress Notes (Signed)
Physical Therapy Treatment Patient Details Name: Vanessa Beard MRN: [id number] DOB: Mar 25, 1951 Today's Date: 02/20/2017    History of Present Illness Patient is a 66 y/o female admitted with Right bicondylar tibial plateau fracture, now s/p ORIF.  PMH     PT Comments    Second session this afternoon focused on stair training as pt has 2 steps to enter home and her bedroom is on second level. Able to complete 1 full step going backwards with RW and Min A. Discussed having family member bring chair to top of second stair and to sit after ascending 1 step if unable to hop up both. Also able to demonstrate safety with butt hopping up/down stairs. Will follow up tomorrow for continued practice on stairs prior to d/c.  Follow Up Recommendations  Home health PT;Supervision - Intermittent     Equipment Recommendations  Other (comment) (Based on tx this afternoon patient will require ambulance transport for safe entry into home.  )    Recommendations for Other Services       Precautions / Restrictions Precautions Precautions: Fall Required Braces or Orthoses: Other Brace/Splint Other Brace/Splint: Bledsoe brace - on when OOB Restrictions Weight Bearing Restrictions: Yes RLE Weight Bearing: Non weight bearing    Mobility  Bed Mobility Overal bed mobility: Needs Assistance Bed Mobility: Supine to Sit;Sit to Supine     Supine to sit: Supervision;HOB elevated Sit to supine: Supervision;HOB elevated   General bed mobility comments: Able to get to EOB without assist; using UEs to move RLE to EOB. Increased time.  Transfers Overall transfer level: Needs assistance Equipment used: Rolling walker (2 wheeled) Transfers: Sit to/from Stand Sit to Stand: Min guard Stand pivot transfers: Min assist       General transfer comment: Min guard for safety. Stood from [employer], SPT bed to/from chair Min guard assist, from chair x2.  Ambulation/Gait Ambulation/Gait assistance: Min  guard Ambulation Distance (Feet): 3 Feet (performed x3) Assistive device: Rolling walker (2 wheeled) Gait Pattern/deviations: Step-to pattern (hop to)   Gait velocity interpretation: Below normal speed for age/gender General Gait Details: hop to pattern with flexed trunk.  Pt fatigues quickly.     Stairs Stairs: Yes   Stair Management: No rails;With walker;Backwards Number of Stairs: 1 General stair comments: Cues sequencing and RW placement. Min A for stabilizing RW and to assist. Able to hop up 1 step backwards but unable to successfully hop up the second one. Practiced butt scooting up/down 5 steps with cues.   Wheelchair Mobility    Modified Rankin (Stroke Patients Only)       Balance Overall balance assessment: Needs assistance Sitting-balance support: No upper extremity supported;Feet supported Sitting balance-Leahy Scale: Good     Standing balance support: Bilateral upper extremity supported;During functional activity Standing balance-Leahy Scale: Poor Standing balance comment: Reliant on BUE support in standing.                            Cognition Arousal/Alertness: Awake/alert Behavior During Therapy: WFL for tasks assessed/performed Overall Cognitive Status: Within Functional Limits for tasks assessed                                        Exercises      General Comments        Pertinent Vitals/Pain Pain Assessment: Faces Pain Score: 7  Faces Pain Scale:  Hurts little more Pain Location: R knee Pain Descriptors / Indicators: Operative site guarding Pain Intervention(s): Monitored during session;Repositioned    Home Living                      Prior Function            PT Goals (current goals can now be found in the care plan section) Acute Rehab PT Goals Patient Stated Goal: To return home Potential to Achieve Goals: Good Progress towards PT goals: Progressing toward goals    Frequency    Min  5X/week      PT Plan Current plan remains appropriate    Co-evaluation              AM-PAC PT "6 Clicks" Daily Activity  Outcome Measure  Difficulty turning over in bed (including adjusting bedclothes, sheets and blankets)?: None Difficulty moving from lying on back to sitting on the side of the bed? : None Difficulty sitting down on and standing up from a chair with arms (e.g., wheelchair, bedside commode, etc,.)?: None Help needed moving to and from a bed to chair (including a wheelchair)?: A Little Help needed walking in hospital room?: A Little Help needed climbing 3-5 steps with a railing? : A Lot 6 Click Score: 20    End of Session Equipment Utilized During Treatment: Gait belt (right bledose) Activity Tolerance: Patient limited by fatigue Patient left: in bed;with call bell/phone within reach Nurse Communication: Mobility status PT Visit Diagnosis: Other abnormalities of gait and mobility (R26.89);History of falling (Z91.81);Difficulty in walking, not elsewhere classified (R26.2);Pain Pain - Right/Left: Right Pain - part of body: Knee     Time: 6073-7106 PT Time Calculation (min) (ACUTE ONLY): 21 min  Charges:  $Gait Training: 8-22 mins $Therapeutic Activity: 8-22 mins                    G Codes:       Wray Kearns, PT, DPT [phone]     Marguarite Arbour A Kalob Bergen 02/20/2017, 3:52 PM

## 2017-02-21 LAB — BASIC METABOLIC PANEL
Anion gap: 8 (ref 5–15)
BUN: 14 mg/dL (ref 6–20)
CO2: 29 mmol/L (ref 22–32)
Calcium: 9.3 mg/dL (ref 8.9–10.3)
Chloride: 100 mmol/L — ABNORMAL LOW (ref 101–111)
Creatinine, Ser: 0.81 mg/dL (ref 0.44–1.00)
GFR calc Af Amer: >60 mL/min (ref >60)
GFR calc non Af Amer: >60 mL/min (ref >60)
Glucose, Bld: 118 mg/dL — ABNORMAL HIGH (ref 65–99)
Potassium: 3.6 mmol/L (ref 3.5–5.1)
Sodium: 137 mmol/L (ref 135–145)

## 2017-02-21 NOTE — Discharge Summary (Signed)
Vanessa Beard, is a 66 y.o. female  DOB Oct 16, 1950  MRN [id number].  Admission date:  02/15/2017  Admitting Physician  Jani Gravel, MD  Discharge Date:  02/21/2017   Primary MD  Seward Carol, MD  Recommendations for primary care physician for things to follow:   R prox Tibial fracture S/p Open reduction internal fixation of bicondylar right tibial plateau fracture10/07/2016 F/u with orthopedics as per recommendations in 2 weeks for suture removal Home PT/OT  Osteopenia/porosis Bone density as outpatient please by PCP  Hypertension Continue amlodipine  Anemia Stable Repeat cbc in 1 week by PCP  HIV/HEP C Cont current anti retroviral F/u with ID [facility]) as scheduled     Admission Diagnosis  Preop cardiovascular exam [Z01.810] Closed fracture of right tibial plateau, initial encounter [S82.141A]   Discharge Diagnosis  Preop cardiovascular exam [Z01.810] Closed fracture of right tibial plateau, initial encounter [S82.141A]    Active Problems:   Human immunodeficiency virus (HIV) disease ([place])   Osteopenia   Anemia   Benign hypertension   Closed fracture of right tibial plateau      Past Medical History:  Diagnosis Date  . Allergic rhinitis   . Biliary colic   . Gall bladder disease \  . Hepatitis C   . Herpes   . HIV infection ([place])   . Hyperparathyroidism ([place])   . Hypertension   . Insomnia   . Osteoporosis   . Pneumonia    2010    Past Surgical History:  Procedure Laterality Date  . CHOLECYSTECTOMY N/A 01/09/2015   Procedure: LAPAROSCOPIC CHOLECYSTECTOMY;  Surgeon: [name], MD;  Location: [facility];  Service: General;  Laterality: N/A;  . ECTOPIC PREGNANCY SURGERY    . ORIF TIBIA PLATEAU Right 02/18/2017   Procedure: OPEN REDUCTION INTERNAL FIXATION (ORIF) RIGHT TIBIAL PLATEAU;  Surgeon: Leandrew Koyanagi, MD;  Location: [facility];  Service: Orthopedics;   Laterality: Right;  . SHOULDER SURGERY Right 12/2014       HPI  from the history and physical done on the day of admission:     66 y.o.female,w Hypertension, HIV, Hepatitis C, Hyperparathyroidism, Osteoporosis [place] and when she was walking stumbled and fell onto her right knee. No syncope. Pt went to ED and it was too busy so she has been putting compresses, and keeping elevated.  Pt couldn't walk and therefore presented to ED at [facility]  In ED, CT scan => IMPRESSION: 1. Comminuted fracture of the proximal tibial metaphysis and epiphysis with multiple locations of intra-articular extension and mild posterior depression of the posterior fragments medially and laterally. This involves the tibial spines with proximally displaced tibial spine fragments. 2. Essentially nondisplaced vertical fracture of the fibular head.3. Small effusion.  Pt will be admitted for R tibial fracture.       Hospital Course:        Pt was admitted for R tibial plateau fracture and placed in knee imobilizer.  Pt had CXR negative for acute process,  K=3.1 repleted and improved to 3.5  And pt appeared stable and was taken to OR on 02/18/2017, for Open reduction internal fixation of bicondylar right tibial plateau fracture.  Pt has been doing well postoperatively with expected post op anemia.  Pt was seen by PT who recommended home health PT, as well as OT who recommended home health OT. Pt needs assistance with mobility.  Pt was not able to go home on 10/5 due to PT=> stated need assistance with steps.  Pt per PT will be able to go home on 10/6    Follow UP  Follow-up Information    Leandrew Koyanagi, MD Follow up in 2 week(s).   Specialty:  Orthopedic Surgery Why:  For suture removal, For wound re-check Contact information: [place] [address] [phone]        Seward Carol, MD Follow up in 1 week(s).   Specialty:  Internal  Medicine Contact information: [address] [place] [address] [phone]        Carlyle Basques, MD Follow up.   Specialty:  Infectious Diseases Contact information: [place] [address] [phone]            Consults obtained - Orthopedics  Discharge Condition: stable  Diet and Activity recommendation: See Discharge Instructions below  Discharge Instructions         Discharge Medications     Allergies as of 02/21/2017      Reactions   Dapsone Nausea Only   stomach  burning   Retrovir [zidovudine] Other (See Comments)   Changed skin color   Sulfamethoxazole-trimethoprim Other (See Comments)   Flu like symptoms      Medication List    STOP taking these medications   ibuprofen 600 MG tablet Commonly known as:  ADVIL,MOTRIN     TAKE these medications   ADULT GUMMY Chew Chew 3 capsules by mouth daily.   amLODipine 10 MG tablet Commonly known as:  NORVASC Take 1 tablet (10 mg total) by mouth daily.   aspirin 325 MG EC tablet Take 1 tablet (325 mg total) by mouth 2 (two) times daily.   Chlorhexidine Gluconate Cloth 2 % Pads Apply 6 each topically daily at 6 (six) AM.   meloxicam 15 MG tablet Commonly known as:  MOBIC Take 1 tablet (15 mg total) by mouth daily with breakfast. What changed:  when to take this   methocarbamol 500 MG tablet Commonly known as:  ROBAXIN Take 1 tablet (500 mg total) by mouth every 6 (six) hours as needed for muscle spasms.   oxyCODONE 5 MG immediate release tablet Commonly known as:  Oxy IR/ROXICODONE Take 1-3 tablets (5-15 mg total) by mouth every 3 (three) hours as needed for breakthrough pain.   polyethylene glycol packet Commonly known as:  MIRALAX / GLYCOLAX Take 17 g by mouth daily as needed for mild constipation.   TRIUMEQ 606-30-160 MG tablet Generic drug:  abacavir-dolutegravir-lamiVUDine TAKE 1 TABLET BY MOUTH DAILY   zolpidem 10 MG  tablet Commonly known as:  AMBIEN TAKE 1 TABLET BY MOUTH AT BEDTIME AS NEEDED What changed:  See the new instructions.            Durable Medical Equipment        Start     Ordered   02/18/17 1809  DME Walker rolling  Once    Question:  Patient needs a walker to treat with the following condition  Answer:  History of open reduction and internal fixation (ORIF) procedure   02/18/17 1808   02/18/17 1809  DME 3 n 1  Once     02/18/17 1808   02/18/17 1809  DME Bedside commode  Once    Question:  Patient needs a bedside commode to treat with the following condition  Answer:  History of open reduction and internal fixation (ORIF) procedure   02/18/17 1808      Major procedures and Radiology Reports - PLEASE review detailed and final reports for all details, in brief -      Dg Chest 2 View  Result Date: 02/15/2017 CLINICAL DATA:  Pre-op chest exam; no chest complaints. Hx of HTN. Pt is a former smoker. Pt is having right knee surgery tomorrow. EXAM: CHEST  2 VIEW COMPARISON:  11/12/2015 FINDINGS: Normal heart size and pulmonary vascularity. Areas of linear scarring in the left lung and right mid lung similar to previous study. No developing consolidation or edema. No blunting of costophrenic angles. No pneumothorax. Tortuous aorta. Old resection or resorption of the distal right clavicle. Degenerative changes in the spine. IMPRESSION: Linear scarring in both lungs is unchanged since prior study. No evidence of active consolidation. Tortuous aorta. Electronically Signed   By: Lucienne Capers M.D.   On: 02/15/2017 22:42   Dg Knee 1-2 Views Right  Result Date: 02/18/2017 CLINICAL DATA:  Internal reduction and fixation of complex proximal tibial fractures. EXAM: RIGHT KNEE - 1-2 VIEW; DG C-ARM 61-120 MIN COMPARISON:  CT scan 02/15/2017 FINDINGS: Medial and lateral plate and screws transfixing complex comminuted tibial plateau fractures with anatomic reduction and no complicating features.  IMPRESSION: Anatomic reduction and internal fixation of complex comminuted tibial plateau fractures. Electronically Signed   By: Marijo Sanes M.D.   On: 02/18/2017 15:57   Ct Knee Right Wo Contrast  Result Date: 02/15/2017 CLINICAL DATA:  Comminuted right tibial metaphysis and epiphysis fracture on radiographs earlier today. There is mild depression of the lateral tibial plateau. The patient fell on the knee 5 days ago. EXAM: CT OF THE RIGHT KNEE WITHOUT CONTRAST TECHNIQUE: Multidetector CT imaging of the right knee was performed according to the standard protocol. Multiplanar CT image reconstructions were also generated. COMPARISON:  Right knee radiographs obtained earlier today. FINDINGS: Bones/Joint/Cartilage Previously demonstrated comminuted fracture of the proximal tibial metaphysis and epiphysis with multiple locations of intra-articular extension. This includes the tibial spines, with proximally displaced fragments of the tibial spines. There is 4 mm of depression of the posterior aspect of the larger posterior fragment of the lateral tibial plateau fracture. There is also 1 mm of depression of the posterior fragment of the medial tibial plateau fracture. Also demonstrated is an essentially nondisplaced vertical fracture of the fibular head. Ligaments Suboptimally assessed by CT. Muscles and Tendons Appear intact. Soft tissues Mild subcutaneous edema.  Small effusion. IMPRESSION: 1. Comminuted fracture of the proximal tibial metaphysis and epiphysis with multiple locations of intra-articular extension and mild posterior depression of the posterior fragments medially and laterally. This involves the tibial spines with proximally displaced tibial spine fragments. 2. Essentially nondisplaced vertical fracture of the fibular head. 3. Small effusion. Electronically Signed   By: Claudie Revering M.D.   On: 02/15/2017 18:58   Dg Knee Complete 4 Views Right  Result Date: 02/15/2017 CLINICAL DATA:  Anterior,  medial posterior right knee pain after falling on the knee. EXAM: RIGHT KNEE - COMPLETE 4+ VIEW COMPARISON:  None. FINDINGS: Comminuted fracture of the proximal tibial metaphysis and  epiphysis, with multiple sites of intra-articular extension. There is some depression of the lateral tibial plateau. Small to moderate-sized effusion. Probable essentially nondisplaced fracture of the posterior aspect of the fibular head. IMPRESSION: 1. Comminuted proximal tibial metaphysis and epiphysis fracture with multiple sites of intra-articular extension and mild depression of the lateral tibial plateau. This could be better defined with CT. 2. Probable nondisplaced fibular head fracture. 3. Small to moderate-sized effusion. Electronically Signed   By: Claudie Revering M.D.   On: 02/15/2017 16:52   Dg C-arm 61-120 Min  Result Date: 02/18/2017 CLINICAL DATA:  Internal reduction and fixation of complex proximal tibial fractures. EXAM: RIGHT KNEE - 1-2 VIEW; DG C-ARM 61-120 MIN COMPARISON:  CT scan 02/15/2017 FINDINGS: Medial and lateral plate and screws transfixing complex comminuted tibial plateau fractures with anatomic reduction and no complicating features. IMPRESSION: Anatomic reduction and internal fixation of complex comminuted tibial plateau fractures. Electronically Signed   By: Marijo Sanes M.D.   On: 02/18/2017 15:57    Micro Results     Recent Results (from the past 240 hour(s))  Surgical PCR screen     Status: Abnormal   Collection Time: 02/15/17 11:51 PM  Result Value Ref Range Status   MRSA, PCR POSITIVE (A) NEGATIVE Final    Comment: RESULT CALLED TO, READ BACK BY AND VERIFIED WITH: RN SHERRY WORTH [phone] @0420  THANEY    Staphylococcus aureus POSITIVE (A) NEGATIVE Final    Comment: (NOTE) The Xpert SA Assay (FDA approved for NASAL specimens in patients 67 years of age and older), is one component of a comprehensive surveillance program. It is not intended to diagnose infection nor to guide or  monitor treatment.        Today   Subjective    Vanessa Beard today been doing well, pain controllled.    No headache,no chest abdominal pain,no new weakness tingling or numbness, feels much better wants to go home today.  Objective   Blood pressure 127/65, pulse 68, temperature 98.4 F (36.9 C), temperature source Axillary, resp. rate 18, height 5' (1.524 m), weight 65.8 kg (145 lb), SpO2 94 %.   Intake/Output Summary (Last 24 hours) at 02/21/17 0528 Last data filed at 02/20/17 1835  Gross per 24 hour  Intake              240 ml  Output                0 ml  Net              240 ml    Exam Awake Alert, Oriented x 3, No new F.N deficits, Normal affect [place].AT,PERRAL Supple Neck,No JVD, No cervical lymphadenopathy appriciated.  Symmetrical Chest wall movement, Good air movement bilaterally, CTAB RRR,No Gallops,Rubs or new Murmurs, No Parasternal Heave +ve B.Sounds, Abd Soft, Non tender, No organomegaly appriciated, No rebound -guarding or rigidity. No Cyanosis, Clubbing or edema, No new Rash or bruise RLE wrapped   Data Review   CBC w Diff: Lab Results  Component Value Date   WBC 6.1 02/19/2017   HGB 9.5 (L) 02/19/2017   HGB 12.7 08/31/2013   HCT 29.0 (L) 02/19/2017   HCT 39.3 08/31/2013   PLT 193 02/19/2017   PLT 156 08/31/2013   LYMPHOPCT 27 02/15/2017   LYMPHOPCT 27.4 08/31/2013   MONOPCT 7 02/15/2017   MONOPCT 7.6 08/31/2013   EOSPCT 2 02/15/2017   EOSPCT 0.9 08/31/2013   BASOPCT 1 02/15/2017   BASOPCT 0.7 08/31/2013  CMP: Lab Results  Component Value Date   NA 132 (L) 02/20/2017   NA 142 08/31/2013   K 3.1 (L) 02/20/2017   K 3.4 (L) 08/31/2013   CL 100 (L) 02/20/2017   CL 104 07/07/2012   CO2 28 02/20/2017   CO2 27 08/31/2013   BUN 10 02/20/2017   BUN 14.7 08/31/2013   CREATININE 0.67 02/20/2017   CREATININE 0.91 01/12/2017   CREATININE 0.8 08/31/2013   PROT 7.0 02/16/2017   PROT 8.8 (H) 08/31/2013   ALBUMIN 2.9 (L) 02/16/2017     ALBUMIN 3.7 08/31/2013   BILITOT 1.0 02/16/2017   BILITOT 0.67 08/31/2013   ALKPHOS 57 02/16/2017   ALKPHOS 99 08/31/2013   AST 26 02/16/2017   AST 25 08/31/2013   ALT 15 02/16/2017   ALT 9 08/31/2013  .   Total Time in preparing paper work, data evaluation and todays exam - 41 minutes  Jani Gravel M.D on 02/21/2017 at 5:28 AM  [facility]   Office  [phone]

## 2017-02-21 NOTE — Progress Notes (Addendum)
Physical Therapy Treatment Patient Details Name: Vanessa Beard MRN: [id number] DOB: 1951/02/12 Today's Date: 02/21/2017    History of Present Illness Patient is a 66 y/o female admitted with Right bicondylar tibial plateau fracture, now s/p ORIF.  PMH     PT Comments    Today's skilled session focused on stair training to maximize pt safety with mobility for d/c home today. Pt continues to be very unstable on stairs. Trialed stairs with both RW and crutches. Recommended pt return home with ambulance transport. Pt adamant that she does not want transport home and can do it with help from her family. Educated pt on the risk of falling again and benefits of transport. Pt continued to insist she did not need transport. Pt would benefit from continued skilled PT after d/c to maximize safety with mobility and functional independent.    Follow Up Recommendations  Home health PT;Supervision - Intermittent     Equipment Recommendations  Other (comment) (Based on tx this am patient will require ambulance transport; however pt refuses transport)   Recommendations for Other Services       Precautions / Restrictions Precautions Precautions: Fall Required Braces or Orthoses: Other Brace/Splint Other Brace/Splint: Bledsoe brace - on when OOB Restrictions Weight Bearing Restrictions: Yes RLE Weight Bearing: Non weight bearing    Mobility  Bed Mobility Overal bed mobility: Needs Assistance Bed Mobility: Supine to Sit     Supine to sit: Supervision;HOB elevated     General bed mobility comments: Able to get to EOB without assist; using UEs to move RLE to EOB. Increased time.  Transfers Overall transfer level: Needs assistance Equipment used: Rolling walker (2 wheeled) Transfers: Sit to/from Stand Sit to Stand: Min guard         General transfer comment: Min guard for safety, cueing for technique and hand placement.  Ambulation/Gait Ambulation/Gait assistance: Min  guard Ambulation Distance (Feet): 6 Feet (18ft x1, 27ft x2) Assistive device: Rolling walker (2 wheeled) Gait Pattern/deviations: Step-to pattern (hop to)     General Gait Details: hop to pattern with flexed trunk.  Pt fatigues quickly.     Stairs Stairs: Yes   Stair Management: No rails;With walker;Backwards;One rail Left;With crutches Number of Stairs: 4 General stair comments: Demonstration and cues provided for technique. Min A for RW management and balance. Pt able to hop to 1 step; however unable to hop to second with RW. Attempted negotiating steps with 1 crutch and 1 hand rail. Pt still very unsteady with this technique.  Wheelchair Mobility    Modified Rankin (Stroke Patients Only)       Balance Overall balance assessment: Needs assistance Sitting-balance support: No upper extremity supported;Feet supported Sitting balance-Leahy Scale: Good     Standing balance support: Bilateral upper extremity supported;During functional activity Standing balance-Leahy Scale: Poor Standing balance comment: Reliant on BUE support in standing.                            Cognition Arousal/Alertness: Awake/alert Behavior During Therapy: WFL for tasks assessed/performed Overall Cognitive Status: Within Functional Limits for tasks assessed                                        Exercises      General Comments        Pertinent Vitals/Pain Pain Assessment: 0-10 Pain Score: 5  Pain Location:  R knee Pain Descriptors / Indicators: Operative site guarding Pain Intervention(s): Monitored during session    Home Living                      Prior Function            PT Goals (current goals can now be found in the care plan section) Acute Rehab PT Goals Patient Stated Goal: To return home PT Goal Formulation: With patient Time For Goal Achievement: 02/23/17 Potential to Achieve Goals: Good Progress towards PT goals: Progressing toward  goals    Frequency    Min 5X/week      PT Plan Current plan remains appropriate;Equipment recommendations need to be updated    Co-evaluation              AM-PAC PT "6 Clicks" Daily Activity  Outcome Measure  Difficulty turning over in bed (including adjusting bedclothes, sheets and blankets)?: None Difficulty moving from lying on back to sitting on the side of the bed? : None Difficulty sitting down on and standing up from a chair with arms (e.g., wheelchair, bedside commode, etc,.)?: None Help needed moving to and from a bed to chair (including a wheelchair)?: A Little Help needed walking in hospital room?: A Little Help needed climbing 3-5 steps with a railing? : A Lot 6 Click Score: 20    End of Session Equipment Utilized During Treatment: Gait belt (right bledose) Activity Tolerance: Patient limited by fatigue Patient left: with call bell/phone within reach;in chair Nurse Communication: Mobility status PT Visit Diagnosis: Other abnormalities of gait and mobility (R26.89);History of falling (Z91.81);Difficulty in walking, not elsewhere classified (R26.2);Pain Pain - Right/Left: Right Pain - part of body: Knee     Time: 0349-1791 PT Time Calculation (min) (ACUTE ONLY): 35 min  Charges:  $Gait Training: 23-37 mins                    G Codes:       Benjiman Core, [place] Pager [phone] Acute Rehab   Allena Katz 02/21/2017, 12:29 PM

## 2017-02-21 NOTE — Care Management Note (Signed)
Case Management Note  Patient Details  Name: Vanessa Beard MRN: [id number] Date of Birth: Jun 09, 1950  Subjective/Objective:    65 y/o F s/p ORIF Tibial Plateau Fx to be discharged with HHPT. No DME per pt. CM asked RN to obtain HHPT orders with F2F from rounding MD. Pt declines need for ambulance transport home. Jermaine aware of above, as pt has chosen [facility] to provide [facility] services.                 Action/Plan:CM will sign off for now but will be available should additional discharge needs arise or disposition change.    Expected Discharge Date:  02/20/17               Expected Discharge Plan:  [place]  In-House Referral:  NA  Discharge planning Services  CM Consult  Post Acute Care Choice:  Home Health Choice offered to:  Patient  DME Arranged:  N/A (Reports no DME needs) DME Agency:  NA  HH Arranged:  PT HH Agency:  [place]  Status of Service:  Completed, signed off  If discussed at [place] of Stay Meetings, dates discussed:    Additional Comments:  Delrae Sawyers, RN 02/21/2017, 9:15 AM

## 2017-02-27 DIAGNOSIS — M810 Age-related osteoporosis without current pathological fracture: Secondary | ICD-10-CM | POA: Diagnosis not present

## 2017-02-27 DIAGNOSIS — I10 Essential (primary) hypertension: Secondary | ICD-10-CM | POA: Diagnosis not present

## 2017-02-27 DIAGNOSIS — S82141D Displaced bicondylar fracture of right tibia, subsequent encounter for closed fracture with routine healing: Secondary | ICD-10-CM | POA: Diagnosis not present

## 2017-02-27 DIAGNOSIS — Z9181 History of falling: Secondary | ICD-10-CM | POA: Diagnosis not present

## 2017-02-27 DIAGNOSIS — D649 Anemia, unspecified: Secondary | ICD-10-CM | POA: Diagnosis not present

## 2017-02-27 DIAGNOSIS — S82831D Other fracture of upper and lower end of right fibula, subsequent encounter for closed fracture with routine healing: Secondary | ICD-10-CM | POA: Diagnosis not present

## 2017-02-27 DIAGNOSIS — B1920 Unspecified viral hepatitis C without hepatic coma: Secondary | ICD-10-CM | POA: Diagnosis not present

## 2017-02-27 DIAGNOSIS — B20 Human immunodeficiency virus [HIV] disease: Secondary | ICD-10-CM | POA: Diagnosis not present

## 2017-03-03 ENCOUNTER — Telehealth (INDEPENDENT_AMBULATORY_CARE_PROVIDER_SITE_OTHER): Payer: Self-pay | Admitting: *Deleted

## 2017-03-03 ENCOUNTER — Inpatient Hospital Stay (INDEPENDENT_AMBULATORY_CARE_PROVIDER_SITE_OTHER): Payer: Commercial Managed Care - HMO | Admitting: Orthopaedic Surgery

## 2017-03-03 DIAGNOSIS — S82141D Displaced bicondylar fracture of right tibia, subsequent encounter for closed fracture with routine healing: Secondary | ICD-10-CM | POA: Diagnosis not present

## 2017-03-03 DIAGNOSIS — B1920 Unspecified viral hepatitis C without hepatic coma: Secondary | ICD-10-CM | POA: Diagnosis not present

## 2017-03-03 DIAGNOSIS — M810 Age-related osteoporosis without current pathological fracture: Secondary | ICD-10-CM | POA: Diagnosis not present

## 2017-03-03 DIAGNOSIS — Z9181 History of falling: Secondary | ICD-10-CM | POA: Diagnosis not present

## 2017-03-03 DIAGNOSIS — S82191A Other fracture of upper end of right tibia, initial encounter for closed fracture: Secondary | ICD-10-CM | POA: Diagnosis not present

## 2017-03-03 DIAGNOSIS — D649 Anemia, unspecified: Secondary | ICD-10-CM | POA: Diagnosis not present

## 2017-03-03 DIAGNOSIS — B20 Human immunodeficiency virus [HIV] disease: Secondary | ICD-10-CM | POA: Diagnosis not present

## 2017-03-03 DIAGNOSIS — S82831D Other fracture of upper and lower end of right fibula, subsequent encounter for closed fracture with routine healing: Secondary | ICD-10-CM | POA: Diagnosis not present

## 2017-03-03 DIAGNOSIS — I10 Essential (primary) hypertension: Secondary | ICD-10-CM | POA: Diagnosis not present

## 2017-03-03 NOTE — Telephone Encounter (Signed)
IC advised.  

## 2017-03-03 NOTE — Telephone Encounter (Signed)
Merry Proud called from [facility] wanting to know is pt has any ROM restrictions. Please call [phone]

## 2017-03-03 NOTE — Telephone Encounter (Signed)
No ROM restrictions.  She needs to follow up with [place].

## 2017-03-03 NOTE — Telephone Encounter (Signed)
Please advise 

## 2017-03-04 ENCOUNTER — Other Ambulatory Visit: Payer: Self-pay | Admitting: *Deleted

## 2017-03-04 DIAGNOSIS — B20 Human immunodeficiency virus [HIV] disease: Secondary | ICD-10-CM

## 2017-03-04 MED ORDER — ABACAVIR-DOLUTEGRAVIR-LAMIVUD 600-50-300 MG PO TABS: 1.0000 | ORAL_TABLET | Freq: Every day | ORAL | 5 refills | Status: DC

## 2017-03-05 ENCOUNTER — Encounter (INDEPENDENT_AMBULATORY_CARE_PROVIDER_SITE_OTHER): Payer: Self-pay | Admitting: Orthopaedic Surgery

## 2017-03-05 ENCOUNTER — Ambulatory Visit (INDEPENDENT_AMBULATORY_CARE_PROVIDER_SITE_OTHER): Payer: Commercial Managed Care - HMO | Admitting: Orthopaedic Surgery

## 2017-03-05 ENCOUNTER — Ambulatory Visit (INDEPENDENT_AMBULATORY_CARE_PROVIDER_SITE_OTHER): Payer: Commercial Managed Care - HMO

## 2017-03-05 DIAGNOSIS — B20 Human immunodeficiency virus [HIV] disease: Secondary | ICD-10-CM | POA: Diagnosis not present

## 2017-03-05 DIAGNOSIS — S82141D Displaced bicondylar fracture of right tibia, subsequent encounter for closed fracture with routine healing: Secondary | ICD-10-CM

## 2017-03-05 DIAGNOSIS — M810 Age-related osteoporosis without current pathological fracture: Secondary | ICD-10-CM | POA: Diagnosis not present

## 2017-03-05 DIAGNOSIS — D649 Anemia, unspecified: Secondary | ICD-10-CM | POA: Diagnosis not present

## 2017-03-05 DIAGNOSIS — Z9181 History of falling: Secondary | ICD-10-CM | POA: Diagnosis not present

## 2017-03-05 DIAGNOSIS — I10 Essential (primary) hypertension: Secondary | ICD-10-CM | POA: Diagnosis not present

## 2017-03-05 DIAGNOSIS — S82831D Other fracture of upper and lower end of right fibula, subsequent encounter for closed fracture with routine healing: Secondary | ICD-10-CM | POA: Diagnosis not present

## 2017-03-05 DIAGNOSIS — B1920 Unspecified viral hepatitis C without hepatic coma: Secondary | ICD-10-CM | POA: Diagnosis not present

## 2017-03-05 NOTE — Progress Notes (Signed)
Patient is 2 weeks status post ORIF right bicondylar tibial plateau fracture. She is living at home already.  She is not really complaining much pain.  Incisions are healed without any signs of infection. No significant swelling. Adequate range of motion. X-ray show stable fixation of the hardware and fracture.  Continue nonweightbearing for 4 more weeks. Bledsoe brace. Continue with knee range of motion. Follow-up in 4 weeks with 2 view x-rays of the right knee.

## 2017-03-06 DIAGNOSIS — M810 Age-related osteoporosis without current pathological fracture: Secondary | ICD-10-CM | POA: Diagnosis not present

## 2017-03-06 DIAGNOSIS — S82831D Other fracture of upper and lower end of right fibula, subsequent encounter for closed fracture with routine healing: Secondary | ICD-10-CM | POA: Diagnosis not present

## 2017-03-06 DIAGNOSIS — D649 Anemia, unspecified: Secondary | ICD-10-CM | POA: Diagnosis not present

## 2017-03-06 DIAGNOSIS — S82201A Unspecified fracture of shaft of right tibia, initial encounter for closed fracture: Secondary | ICD-10-CM | POA: Diagnosis not present

## 2017-03-06 DIAGNOSIS — Z9181 History of falling: Secondary | ICD-10-CM | POA: Diagnosis not present

## 2017-03-06 DIAGNOSIS — B1920 Unspecified viral hepatitis C without hepatic coma: Secondary | ICD-10-CM | POA: Diagnosis not present

## 2017-03-06 DIAGNOSIS — R269 Unspecified abnormalities of gait and mobility: Secondary | ICD-10-CM | POA: Diagnosis not present

## 2017-03-06 DIAGNOSIS — S82141D Displaced bicondylar fracture of right tibia, subsequent encounter for closed fracture with routine healing: Secondary | ICD-10-CM | POA: Diagnosis not present

## 2017-03-06 DIAGNOSIS — I10 Essential (primary) hypertension: Secondary | ICD-10-CM | POA: Diagnosis not present

## 2017-03-06 DIAGNOSIS — B20 Human immunodeficiency virus [HIV] disease: Secondary | ICD-10-CM | POA: Diagnosis not present

## 2017-03-09 DIAGNOSIS — Z9181 History of falling: Secondary | ICD-10-CM | POA: Diagnosis not present

## 2017-03-09 DIAGNOSIS — I10 Essential (primary) hypertension: Secondary | ICD-10-CM | POA: Diagnosis not present

## 2017-03-09 DIAGNOSIS — B1920 Unspecified viral hepatitis C without hepatic coma: Secondary | ICD-10-CM | POA: Diagnosis not present

## 2017-03-09 DIAGNOSIS — M810 Age-related osteoporosis without current pathological fracture: Secondary | ICD-10-CM | POA: Diagnosis not present

## 2017-03-09 DIAGNOSIS — D649 Anemia, unspecified: Secondary | ICD-10-CM | POA: Diagnosis not present

## 2017-03-09 DIAGNOSIS — S82141D Displaced bicondylar fracture of right tibia, subsequent encounter for closed fracture with routine healing: Secondary | ICD-10-CM | POA: Diagnosis not present

## 2017-03-09 DIAGNOSIS — S82831D Other fracture of upper and lower end of right fibula, subsequent encounter for closed fracture with routine healing: Secondary | ICD-10-CM | POA: Diagnosis not present

## 2017-03-09 DIAGNOSIS — B20 Human immunodeficiency virus [HIV] disease: Secondary | ICD-10-CM | POA: Diagnosis not present

## 2017-03-11 DIAGNOSIS — M810 Age-related osteoporosis without current pathological fracture: Secondary | ICD-10-CM | POA: Diagnosis not present

## 2017-03-11 DIAGNOSIS — I10 Essential (primary) hypertension: Secondary | ICD-10-CM | POA: Diagnosis not present

## 2017-03-11 DIAGNOSIS — D649 Anemia, unspecified: Secondary | ICD-10-CM | POA: Diagnosis not present

## 2017-03-11 DIAGNOSIS — Z9181 History of falling: Secondary | ICD-10-CM | POA: Diagnosis not present

## 2017-03-11 DIAGNOSIS — B20 Human immunodeficiency virus [HIV] disease: Secondary | ICD-10-CM | POA: Diagnosis not present

## 2017-03-11 DIAGNOSIS — S82141D Displaced bicondylar fracture of right tibia, subsequent encounter for closed fracture with routine healing: Secondary | ICD-10-CM | POA: Diagnosis not present

## 2017-03-11 DIAGNOSIS — S82831D Other fracture of upper and lower end of right fibula, subsequent encounter for closed fracture with routine healing: Secondary | ICD-10-CM | POA: Diagnosis not present

## 2017-03-11 DIAGNOSIS — B1920 Unspecified viral hepatitis C without hepatic coma: Secondary | ICD-10-CM | POA: Diagnosis not present

## 2017-03-13 DIAGNOSIS — S82831D Other fracture of upper and lower end of right fibula, subsequent encounter for closed fracture with routine healing: Secondary | ICD-10-CM | POA: Diagnosis not present

## 2017-03-13 DIAGNOSIS — M810 Age-related osteoporosis without current pathological fracture: Secondary | ICD-10-CM | POA: Diagnosis not present

## 2017-03-13 DIAGNOSIS — S82141D Displaced bicondylar fracture of right tibia, subsequent encounter for closed fracture with routine healing: Secondary | ICD-10-CM | POA: Diagnosis not present

## 2017-03-13 DIAGNOSIS — Z9181 History of falling: Secondary | ICD-10-CM | POA: Diagnosis not present

## 2017-03-13 DIAGNOSIS — B20 Human immunodeficiency virus [HIV] disease: Secondary | ICD-10-CM | POA: Diagnosis not present

## 2017-03-13 DIAGNOSIS — D649 Anemia, unspecified: Secondary | ICD-10-CM | POA: Diagnosis not present

## 2017-03-13 DIAGNOSIS — B1920 Unspecified viral hepatitis C without hepatic coma: Secondary | ICD-10-CM | POA: Diagnosis not present

## 2017-03-13 DIAGNOSIS — I10 Essential (primary) hypertension: Secondary | ICD-10-CM | POA: Diagnosis not present

## 2017-03-17 ENCOUNTER — Telehealth (INDEPENDENT_AMBULATORY_CARE_PROVIDER_SITE_OTHER): Payer: Self-pay | Admitting: Orthopaedic Surgery

## 2017-03-17 DIAGNOSIS — I10 Essential (primary) hypertension: Secondary | ICD-10-CM | POA: Diagnosis not present

## 2017-03-17 DIAGNOSIS — M810 Age-related osteoporosis without current pathological fracture: Secondary | ICD-10-CM | POA: Diagnosis not present

## 2017-03-17 DIAGNOSIS — Z9181 History of falling: Secondary | ICD-10-CM | POA: Diagnosis not present

## 2017-03-17 DIAGNOSIS — S82141D Displaced bicondylar fracture of right tibia, subsequent encounter for closed fracture with routine healing: Secondary | ICD-10-CM | POA: Diagnosis not present

## 2017-03-17 DIAGNOSIS — B20 Human immunodeficiency virus [HIV] disease: Secondary | ICD-10-CM | POA: Diagnosis not present

## 2017-03-17 DIAGNOSIS — D649 Anemia, unspecified: Secondary | ICD-10-CM | POA: Diagnosis not present

## 2017-03-17 DIAGNOSIS — S82831D Other fracture of upper and lower end of right fibula, subsequent encounter for closed fracture with routine healing: Secondary | ICD-10-CM | POA: Diagnosis not present

## 2017-03-17 DIAGNOSIS — B1920 Unspecified viral hepatitis C without hepatic coma: Secondary | ICD-10-CM | POA: Diagnosis not present

## 2017-03-17 NOTE — Telephone Encounter (Signed)
#  [name].

## 2017-03-17 NOTE — Telephone Encounter (Signed)
Patient called requesting an RX refill on her Oxycodone.  She would like for you to call it into the pharmacy.  [facility] [address].  CB#[phone].  Thank you.

## 2017-03-17 NOTE — Telephone Encounter (Signed)
Please advise 

## 2017-03-18 DIAGNOSIS — B1920 Unspecified viral hepatitis C without hepatic coma: Secondary | ICD-10-CM | POA: Diagnosis not present

## 2017-03-18 DIAGNOSIS — I10 Essential (primary) hypertension: Secondary | ICD-10-CM | POA: Diagnosis not present

## 2017-03-18 DIAGNOSIS — M810 Age-related osteoporosis without current pathological fracture: Secondary | ICD-10-CM | POA: Diagnosis not present

## 2017-03-18 DIAGNOSIS — D649 Anemia, unspecified: Secondary | ICD-10-CM | POA: Diagnosis not present

## 2017-03-18 DIAGNOSIS — Z9181 History of falling: Secondary | ICD-10-CM | POA: Diagnosis not present

## 2017-03-18 DIAGNOSIS — S82831D Other fracture of upper and lower end of right fibula, subsequent encounter for closed fracture with routine healing: Secondary | ICD-10-CM | POA: Diagnosis not present

## 2017-03-18 DIAGNOSIS — S82141D Displaced bicondylar fracture of right tibia, subsequent encounter for closed fracture with routine healing: Secondary | ICD-10-CM | POA: Diagnosis not present

## 2017-03-18 DIAGNOSIS — B20 Human immunodeficiency virus [HIV] disease: Secondary | ICD-10-CM | POA: Diagnosis not present

## 2017-03-18 MED ORDER — HYDROCODONE-ACETAMINOPHEN 5-325 MG PO TABS: 1.0000 | ORAL_TABLET | Freq: Two times a day (BID) | ORAL | 0 refills | Status: DC | PRN

## 2017-03-18 NOTE — Telephone Encounter (Signed)
Rx printed pending Dr Phoebe Sharps signature. He is in [place] all day today and will be ready for pick up tomorrow AM

## 2017-03-19 NOTE — Telephone Encounter (Signed)
Called pt to come pick up

## 2017-03-20 DIAGNOSIS — D649 Anemia, unspecified: Secondary | ICD-10-CM | POA: Diagnosis not present

## 2017-03-20 DIAGNOSIS — S82831D Other fracture of upper and lower end of right fibula, subsequent encounter for closed fracture with routine healing: Secondary | ICD-10-CM | POA: Diagnosis not present

## 2017-03-20 DIAGNOSIS — I10 Essential (primary) hypertension: Secondary | ICD-10-CM | POA: Diagnosis not present

## 2017-03-20 DIAGNOSIS — B1920 Unspecified viral hepatitis C without hepatic coma: Secondary | ICD-10-CM | POA: Diagnosis not present

## 2017-03-20 DIAGNOSIS — B20 Human immunodeficiency virus [HIV] disease: Secondary | ICD-10-CM | POA: Diagnosis not present

## 2017-03-20 DIAGNOSIS — M810 Age-related osteoporosis without current pathological fracture: Secondary | ICD-10-CM | POA: Diagnosis not present

## 2017-03-20 DIAGNOSIS — S82141D Displaced bicondylar fracture of right tibia, subsequent encounter for closed fracture with routine healing: Secondary | ICD-10-CM | POA: Diagnosis not present

## 2017-03-20 DIAGNOSIS — Z9181 History of falling: Secondary | ICD-10-CM | POA: Diagnosis not present

## 2017-03-23 DIAGNOSIS — I10 Essential (primary) hypertension: Secondary | ICD-10-CM | POA: Diagnosis not present

## 2017-03-23 DIAGNOSIS — B20 Human immunodeficiency virus [HIV] disease: Secondary | ICD-10-CM | POA: Diagnosis not present

## 2017-03-23 DIAGNOSIS — D649 Anemia, unspecified: Secondary | ICD-10-CM | POA: Diagnosis not present

## 2017-03-23 DIAGNOSIS — B1920 Unspecified viral hepatitis C without hepatic coma: Secondary | ICD-10-CM | POA: Diagnosis not present

## 2017-03-23 DIAGNOSIS — Z9181 History of falling: Secondary | ICD-10-CM | POA: Diagnosis not present

## 2017-03-23 DIAGNOSIS — S82141D Displaced bicondylar fracture of right tibia, subsequent encounter for closed fracture with routine healing: Secondary | ICD-10-CM | POA: Diagnosis not present

## 2017-03-23 DIAGNOSIS — S82831D Other fracture of upper and lower end of right fibula, subsequent encounter for closed fracture with routine healing: Secondary | ICD-10-CM | POA: Diagnosis not present

## 2017-03-23 DIAGNOSIS — M810 Age-related osteoporosis without current pathological fracture: Secondary | ICD-10-CM | POA: Diagnosis not present

## 2017-03-26 ENCOUNTER — Ambulatory Visit (INDEPENDENT_AMBULATORY_CARE_PROVIDER_SITE_OTHER): Payer: Medicare HMO | Admitting: Orthopaedic Surgery

## 2017-03-26 ENCOUNTER — Encounter (INDEPENDENT_AMBULATORY_CARE_PROVIDER_SITE_OTHER): Payer: Self-pay | Admitting: Orthopaedic Surgery

## 2017-03-26 ENCOUNTER — Ambulatory Visit (INDEPENDENT_AMBULATORY_CARE_PROVIDER_SITE_OTHER): Payer: Commercial Managed Care - HMO

## 2017-03-26 DIAGNOSIS — S82141D Displaced bicondylar fracture of right tibia, subsequent encounter for closed fracture with routine healing: Secondary | ICD-10-CM | POA: Diagnosis not present

## 2017-03-26 MED ORDER — MUPIROCIN 2 % EX OINT: 1.0000 "application " | TOPICAL_OINTMENT | Freq: Two times a day (BID) | CUTANEOUS | 0 refills | Status: DC

## 2017-03-26 NOTE — Progress Notes (Signed)
Patient is 5 weeks status post right bicondylar tibial plateau fracture.  She is doing well overall.  Her pain is well controlled.  She has a little bit of peri-incisional numbness.  There is no signs of infection.  She has a small area in the distal medial incision with some granulation tissue but without drainage.  Her x-rays show stable fixation and good alignment without complications.  Her knee range of motion is excellent.  Bactroban ointment was prescribed for the superficial open area.  May advance to weight-bear as tolerated.  Referral to physical therapy.  Follow-up in 6 weeks with 2 view x-rays of the right knee

## 2017-03-27 DIAGNOSIS — D649 Anemia, unspecified: Secondary | ICD-10-CM | POA: Diagnosis not present

## 2017-03-27 DIAGNOSIS — I10 Essential (primary) hypertension: Secondary | ICD-10-CM | POA: Diagnosis not present

## 2017-03-27 DIAGNOSIS — B20 Human immunodeficiency virus [HIV] disease: Secondary | ICD-10-CM | POA: Diagnosis not present

## 2017-03-27 DIAGNOSIS — Z9181 History of falling: Secondary | ICD-10-CM | POA: Diagnosis not present

## 2017-03-27 DIAGNOSIS — B1920 Unspecified viral hepatitis C without hepatic coma: Secondary | ICD-10-CM | POA: Diagnosis not present

## 2017-03-27 DIAGNOSIS — S82831D Other fracture of upper and lower end of right fibula, subsequent encounter for closed fracture with routine healing: Secondary | ICD-10-CM | POA: Diagnosis not present

## 2017-03-27 DIAGNOSIS — S82141D Displaced bicondylar fracture of right tibia, subsequent encounter for closed fracture with routine healing: Secondary | ICD-10-CM | POA: Diagnosis not present

## 2017-03-27 DIAGNOSIS — M810 Age-related osteoporosis without current pathological fracture: Secondary | ICD-10-CM | POA: Diagnosis not present

## 2017-03-30 ENCOUNTER — Telehealth (INDEPENDENT_AMBULATORY_CARE_PROVIDER_SITE_OTHER): Payer: Self-pay | Admitting: *Deleted

## 2017-03-30 NOTE — Telephone Encounter (Signed)
Received fax from [facility] [address] advising pt has appt scheduled on 04/01/17 at 3p, per fax pt is aware of appt

## 2017-04-01 DIAGNOSIS — M25461 Effusion, right knee: Secondary | ICD-10-CM | POA: Diagnosis not present

## 2017-04-01 DIAGNOSIS — R262 Difficulty in walking, not elsewhere classified: Secondary | ICD-10-CM | POA: Diagnosis not present

## 2017-04-01 DIAGNOSIS — M25661 Stiffness of right knee, not elsewhere classified: Secondary | ICD-10-CM | POA: Diagnosis not present

## 2017-04-01 DIAGNOSIS — M25361 Other instability, right knee: Secondary | ICD-10-CM | POA: Diagnosis not present

## 2017-04-03 ENCOUNTER — Telehealth (INDEPENDENT_AMBULATORY_CARE_PROVIDER_SITE_OTHER): Payer: Self-pay | Admitting: Orthopaedic Surgery

## 2017-04-03 ENCOUNTER — Other Ambulatory Visit: Payer: Self-pay | Admitting: *Deleted

## 2017-04-03 DIAGNOSIS — G4700 Insomnia, unspecified: Secondary | ICD-10-CM

## 2017-04-03 MED ORDER — ZOLPIDEM TARTRATE 10 MG PO TABS: 10.0000 mg | ORAL_TABLET | Freq: Every evening | ORAL | 2 refills | Status: DC | PRN

## 2017-04-03 NOTE — Telephone Encounter (Signed)
Patient requests RX refill on hydrocodone, states she only has two pills left and will need them before we are closed for thanksgiving.  Patients # [phone]

## 2017-04-04 NOTE — Telephone Encounter (Signed)
She can only get #10 more.  After that, she needs to take tramadol, tylenol or advil.

## 2017-04-06 ENCOUNTER — Telehealth (INDEPENDENT_AMBULATORY_CARE_PROVIDER_SITE_OTHER): Payer: Self-pay | Admitting: Orthopaedic Surgery

## 2017-04-06 MED ORDER — HYDROCODONE-ACETAMINOPHEN 5-325 MG PO TABS: 1.0000 | ORAL_TABLET | Freq: Every day | ORAL | 0 refills | Status: DC | PRN

## 2017-04-06 MED ORDER — MELOXICAM 15 MG PO TABS: 15.0000 mg | ORAL_TABLET | Freq: Every day | ORAL | 0 refills | Status: DC

## 2017-04-06 NOTE — Telephone Encounter (Signed)
Sent to pharm ....

## 2017-04-06 NOTE — Telephone Encounter (Signed)
Yes #30

## 2017-04-06 NOTE — Telephone Encounter (Signed)
Okay to RF Meloxicam?  RX for Hydrocodone Printed already printed and signed by you.

## 2017-04-06 NOTE — Telephone Encounter (Signed)
Patient called needing Rx refilled (Meloxicam and Hydrocodone) The number to contact patient is [phone] or [phone]

## 2017-04-06 NOTE — Telephone Encounter (Signed)
I called and spoke with patient to advise of message below. 

## 2017-04-14 DIAGNOSIS — M25361 Other instability, right knee: Secondary | ICD-10-CM | POA: Diagnosis not present

## 2017-04-14 DIAGNOSIS — M25661 Stiffness of right knee, not elsewhere classified: Secondary | ICD-10-CM | POA: Diagnosis not present

## 2017-04-14 DIAGNOSIS — R262 Difficulty in walking, not elsewhere classified: Secondary | ICD-10-CM | POA: Diagnosis not present

## 2017-04-14 DIAGNOSIS — M25461 Effusion, right knee: Secondary | ICD-10-CM | POA: Diagnosis not present

## 2017-04-21 ENCOUNTER — Telehealth (INDEPENDENT_AMBULATORY_CARE_PROVIDER_SITE_OTHER): Payer: Self-pay | Admitting: Orthopaedic Surgery

## 2017-04-21 MED ORDER — HYDROCODONE-ACETAMINOPHEN 5-325 MG PO TABS: 1.0000 | ORAL_TABLET | Freq: Every day | ORAL | 0 refills | Status: DC | PRN

## 2017-04-21 NOTE — Telephone Encounter (Signed)
Please advise 

## 2017-04-21 NOTE — Telephone Encounter (Signed)
Med refill  Hydrocodone   Pt stated she is in a lot of pain and keeps her from sleeping. Pt has appt on 05/07/17.

## 2017-04-21 NOTE — Telephone Encounter (Signed)
#  14. 1 tab po daily prn

## 2017-04-21 NOTE — Telephone Encounter (Signed)
Script entered. I called patient and advised.

## 2017-04-23 DIAGNOSIS — M25361 Other instability, right knee: Secondary | ICD-10-CM | POA: Diagnosis not present

## 2017-04-23 DIAGNOSIS — M25461 Effusion, right knee: Secondary | ICD-10-CM | POA: Diagnosis not present

## 2017-04-23 DIAGNOSIS — R262 Difficulty in walking, not elsewhere classified: Secondary | ICD-10-CM | POA: Diagnosis not present

## 2017-04-23 DIAGNOSIS — M25661 Stiffness of right knee, not elsewhere classified: Secondary | ICD-10-CM | POA: Diagnosis not present

## 2017-05-01 ENCOUNTER — Telehealth (INDEPENDENT_AMBULATORY_CARE_PROVIDER_SITE_OTHER): Payer: Self-pay | Admitting: Orthopaedic Surgery

## 2017-05-01 NOTE — Telephone Encounter (Signed)
Patient understands she is not able to get a refill on hydrocodone but she is asking for a RX of a high strength ibuprofen/tylenol/etc for pain. Patient CB # [phone]

## 2017-05-04 ENCOUNTER — Encounter: Payer: Self-pay | Admitting: Internal Medicine

## 2017-05-04 ENCOUNTER — Ambulatory Visit (INDEPENDENT_AMBULATORY_CARE_PROVIDER_SITE_OTHER): Payer: Medicare HMO | Admitting: Internal Medicine

## 2017-05-04 ENCOUNTER — Telehealth (INDEPENDENT_AMBULATORY_CARE_PROVIDER_SITE_OTHER): Payer: Self-pay | Admitting: Orthopaedic Surgery

## 2017-05-04 VITALS — BP 151/95 | HR 75 | Temp 97.5°F | Ht 60.0 in | Wt 148.0 lb

## 2017-05-04 DIAGNOSIS — B182 Chronic viral hepatitis C: Secondary | ICD-10-CM | POA: Diagnosis not present

## 2017-05-04 DIAGNOSIS — S82101D Unspecified fracture of upper end of right tibia, subsequent encounter for closed fracture with routine healing: Secondary | ICD-10-CM | POA: Diagnosis not present

## 2017-05-04 DIAGNOSIS — B20 Human immunodeficiency virus [HIV] disease: Secondary | ICD-10-CM | POA: Diagnosis not present

## 2017-05-04 LAB — COMPLETE METABOLIC PANEL WITH GFR
AG Ratio: 1 (calc) (ref 1.0–2.5)
ALT: 8 U/L (ref 6–29)
AST: 18 U/L (ref 10–35)
Albumin: 3.9 g/dL (ref 3.6–5.1)
Alkaline phosphatase (APISO): 90 U/L (ref 33–130)
BUN: 12 mg/dL (ref 7–25)
CO2: 30 mmol/L (ref 20–32)
Calcium: 9.4 mg/dL (ref 8.6–10.4)
Chloride: 103 mmol/L (ref 98–110)
Creat: 0.8 mg/dL (ref 0.50–0.99)
GFR, Est African American: 88 mL/min/{1.73_m2} (ref >=60)
GFR, Est Non African American: 76 mL/min/{1.73_m2} (ref >=60)
Globulin: 4 g/dL (calc) — ABNORMAL HIGH (ref 1.9–3.7)
Glucose, Bld: 82 mg/dL (ref 65–99)
Potassium: 4 mmol/L (ref 3.5–5.3)
Sodium: 137 mmol/L (ref 135–146)
Total Bilirubin: 0.4 mg/dL (ref 0.2–1.2)
Total Protein: 7.9 g/dL (ref 6.1–8.1)

## 2017-05-04 LAB — CBC WITH DIFFERENTIAL/PLATELET
Basophils Absolute: 40 cells/uL (ref 0–200)
Basophils Relative: 1 %
Eosinophils Absolute: 48 cells/uL (ref 15–500)
Eosinophils Relative: 1.2 %
HCT: 38 % (ref 35.0–45.0)
Hemoglobin: 12.8 g/dL (ref 11.7–15.5)
Lymphs Abs: 1156 cells/uL (ref 850–3900)
MCH: 29.6 pg (ref 27.0–33.0)
MCHC: 33.7 g/dL (ref 32.0–36.0)
MCV: 87.8 fL (ref 80.0–100.0)
MPV: 11.7 fL (ref 7.5–12.5)
Monocytes Relative: 8.2 %
Neutro Abs: 2428 cells/uL (ref 1500–7800)
Neutrophils Relative %: 60.7 %
Platelets: 184 10*3/uL (ref 140–400)
RBC: 4.33 10*6/uL (ref 3.80–5.10)
RDW: 12.4 % (ref 11.0–15.0)
Total Lymphocyte: 28.9 %
WBC mixed population: 328 cells/uL (ref 200–950)
WBC: 4 10*3/uL (ref 3.8–10.8)

## 2017-05-04 NOTE — Telephone Encounter (Signed)
Motrin 800 mg tid prn pain. #30

## 2017-05-04 NOTE — Telephone Encounter (Signed)
Returned call to patient advised Kathlee Nations will give her a call when Rx for pain is ready.  [phone]

## 2017-05-04 NOTE — Progress Notes (Signed)
RFV: follow up with hiv disease  Patient ID: Vanessa Beard, female   DOB: 1950-11-29, 66 y.o.   MRN: [id number]  HPI Vanessa Beard is a 66yo F with hiv disease-treated hepatitis c, currently on triumeq. CD 4 count of 190/VL<20 (aug 2018). Broke her right tibia on oct 5th. Has ORIF. Getting better steadily with strengthening her right leg with PT.has good adherence  Previously having difficulty with getting her son to move out. Now resolved.  Outpatient Encounter Medications as of 05/04/2017  Medication Sig  . abacavir-dolutegravir-lamiVUDine (TRIUMEQ) 600-50-300 MG tablet Take 1 tablet by mouth daily.  Marland Kitchen amLODipine (NORVASC) 10 MG tablet Take 1 tablet (10 mg total) by mouth daily.  Marland Kitchen aspirin EC 325 MG EC tablet Take 1 tablet (325 mg total) by mouth 2 (two) times daily.  Marland Kitchen HYDROcodone-acetaminophen (NORCO/VICODIN) 5-325 MG tablet Take 1 tablet by mouth daily as needed for severe pain.  . meloxicam (MOBIC) 15 MG tablet Take 1 tablet (15 mg total) daily with breakfast by mouth.  . methocarbamol (ROBAXIN) 500 MG tablet Take 1 tablet (500 mg total) by mouth every 6 (six) hours as needed for muscle spasms.  . Multiple Vitamins-Minerals (ADULT GUMMY) CHEW Chew 3 capsules by mouth daily.  . mupirocin ointment (BACTROBAN) 2 % Place 1 application 2 (two) times daily into the nose.  . zolpidem (AMBIEN) 10 MG tablet Take 1 tablet (10 mg total) at bedtime as needed by mouth.  . Chlorhexidine Gluconate Cloth 2 % PADS Apply 6 each topically daily at 6 (six) AM. (Patient not taking: Reported on 05/04/2017)  . oxyCODONE (OXY IR/ROXICODONE) 5 MG immediate release tablet Take 1-3 tablets (5-15 mg total) by mouth every 3 (three) hours as needed for breakthrough pain.  . polyethylene glycol (MIRALAX / GLYCOLAX) packet Take 17 g by mouth daily as needed for mild constipation.  . [DISCONTINUED] meloxicam (MOBIC) 15 MG tablet Take 1 tablet (15 mg total) by mouth daily with breakfast.  . [DISCONTINUED]  zolpidem (AMBIEN) 10 MG tablet TAKE 1 TABLET BY MOUTH AT BEDTIME AS NEEDED (Patient taking differently: TAKE 1 TABLET BY MOUTH AT BEDTIME AS FOR SLEEP)   No facility-administered encounter medications on file as of 05/04/2017.      Patient Active Problem List   Diagnosis Date Noted  . Closed fracture of right tibial plateau 02/18/2017  . Benign hypertension 02/15/2017  . Screening examination for venereal disease 03/06/2015  . Encounter for long-term (current) use of medications 03/06/2015  . Elevated LFTs 12/11/2014  . Abnormal ultrasound 12/11/2014  . Abdominal pain, epigastric 12/11/2014  . ASCUS with positive high risk HPV 10/31/2013  . Gall bladder disease 02/03/2013  . 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  . Compression fracture of L3 lumbar vertebra ([place]) 07/01/2011  . Thyroid dysfunction 07/01/2011  . Anemia 07/01/2011  . Sore throat 04/02/2011  . Hyperparathyroidism 02/10/2011  . Osteopenia 02/03/2011  . PAP SMEAR, LGSIL, ABNORMAL 12/21/2009  . COUGH 07/18/2009  . Chronic hepatitis C virus infection ([place]) 12/19/2008  . LEUKORRHEA 04/07/2008  . FATIGUE 04/07/2008  . BACK PAIN, LUMBAR 11/10/2007  . ALLERGIC RHINITIS 07/09/2007  . PNEUMOCYSTIS PNEUMONIA 05/07/2007  . HYPERTENSION 05/07/2007  . DENTAL CARIES 05/07/2007  . INSOMNIA 05/07/2007  . PNEUMONIA, HX OF 05/07/2007  . HERPES ZOSTER, HX OF 05/07/2007  . Human immunodeficiency virus (HIV) disease ([place]) 08/04/2006     Health Maintenance Due  Topic Date Due  . COLONOSCOPY  03/08/1999  . PNA vac Low Risk Adult (1 of 2 -  PCV13) 03/07/2014     Review of Systems Right leg pain from surgery/fracture. Otherwise 12 point ros is negative Physical Exam   BP (!) 151/95   Pulse 75   Temp (!) 97.5 F (36.4 C) (Oral)   Ht 5' (1.524 m)   Wt 148 lb (67.1 kg)   BMI 28.90 kg/m   Physical Exam  Constitutional:  oriented to person, place, and time. appears well-developed and well-nourished.  No distress.  HENT: [place]/AT, PERRLA, no scleral icterus Mouth/Throat: Oropharynx is clear and moist. No oropharyngeal exudate.  Cardiovascular: Normal rate, regular rhythm and normal heart sounds. Exam reveals no gallop and no friction rub.  No murmur heard.  Pulmonary/Chest: Effort normal and breath sounds normal. No respiratory distress.  has no wheezes.  Neck = supple, no nuchal rigidity Lymphadenopathy: no cervical adenopathy. No axillary adenopathy Neurological: alert and oriented to person, place, and time.  Skin: Skin is warm and dry. No rash noted. No erythema.  Psychiatric: a normal mood and affect.  behavior is normal.  GYJ:EHUDJS edema trace to right leg. Wearing brace. Lab Results  Component Value Date   CD4TCELL 20 (L) 01/12/2017   Lab Results  Component Value Date   CD4TABS 190 (L) 01/12/2017   CD4TABS 270 (L) 07/29/2016   CD4TABS 220 (L) 03/27/2016   Lab Results  Component Value Date   HIV1RNAQUANT 26 (H) 01/12/2017   Lab Results  Component Value Date   HEPBSAB NEG 12/29/2013   Lab Results  Component Value Date   LABRPR NON REAC 01/12/2017    CBC Lab Results  Component Value Date   WBC 6.1 02/19/2017   RBC 3.22 (L) 02/19/2017   HGB 9.5 (L) 02/19/2017   HCT 29.0 (L) 02/19/2017   PLT 193 02/19/2017   MCV 90.1 02/19/2017   MCH 29.5 02/19/2017   MCHC 32.8 02/19/2017   RDW 13.9 02/19/2017   LYMPHSABS 1.2 02/15/2017   MONOABS 0.3 02/15/2017   EOSABS 0.1 02/15/2017    BMET Lab Results  Component Value Date   NA 137 02/21/2017   K 3.6 02/21/2017   CL 100 (L) 02/21/2017   CO2 29 02/21/2017   GLUCOSE 118 (H) 02/21/2017   BUN 14 02/21/2017   CREATININE 0.81 02/21/2017   CALCIUM 9.3 02/21/2017   GFRNONAA >60 02/21/2017   GFRAA >60 02/21/2017      Assessment and Plan  hiv disease = is well controlled, but last lab showed cd 4 count < 200.  Will check labs today, if still <200, will need to give oi proph. Has sulfa and dapsone allergy  Chronic  hepatitis c without hepatic coma = will need to get [place] for [facility] surveillance  Recent tibia fracture = to continue with cane, and PT to strengthen lower extremity. Follow up with dr Erlinda Hong  Long-term med monitoring = will check bmp to see that cr is stable  Health maintenance = mammo due next may, otherwise uptodate of vaccine. Will need PS13 next year

## 2017-05-04 NOTE — Telephone Encounter (Signed)
Please advise 

## 2017-05-05 ENCOUNTER — Other Ambulatory Visit (INDEPENDENT_AMBULATORY_CARE_PROVIDER_SITE_OTHER): Payer: Self-pay

## 2017-05-05 LAB — T-HELPER CELL (CD4) - (RCID CLINIC ONLY)
CD4 % Helper T Cell: 12 % — ABNORMAL LOW (ref 33–55)
CD4 T Cell Abs: 160 /uL — ABNORMAL LOW (ref 400–2700)

## 2017-05-05 MED ORDER — IBUPROFEN 800 MG PO TABS: 800.0000 mg | ORAL_TABLET | Freq: Three times a day (TID) | ORAL | 0 refills | Status: DC | PRN

## 2017-05-05 NOTE — Telephone Encounter (Signed)
rx sent into pharm

## 2017-05-06 LAB — HIV-1 RNA QUANT-NO REFLEX-BLD
HIV 1 RNA Quant: <20 copies/mL
HIV-1 RNA Quant, Log: <1.3 Log copies/mL

## 2017-05-07 ENCOUNTER — Ambulatory Visit (INDEPENDENT_AMBULATORY_CARE_PROVIDER_SITE_OTHER): Payer: Commercial Managed Care - HMO

## 2017-05-07 ENCOUNTER — Telehealth: Payer: Self-pay | Admitting: *Deleted

## 2017-05-07 ENCOUNTER — Ambulatory Visit (INDEPENDENT_AMBULATORY_CARE_PROVIDER_SITE_OTHER): Payer: Commercial Managed Care - HMO | Admitting: Orthopaedic Surgery

## 2017-05-07 ENCOUNTER — Encounter (INDEPENDENT_AMBULATORY_CARE_PROVIDER_SITE_OTHER): Payer: Self-pay | Admitting: Orthopaedic Surgery

## 2017-05-07 DIAGNOSIS — M25561 Pain in right knee: Secondary | ICD-10-CM

## 2017-05-07 DIAGNOSIS — G8929 Other chronic pain: Secondary | ICD-10-CM

## 2017-05-07 DIAGNOSIS — S82141D Displaced bicondylar fracture of right tibia, subsequent encounter for closed fracture with routine healing: Secondary | ICD-10-CM

## 2017-05-07 MED ORDER — DOXYCYCLINE HYCLATE 100 MG PO TABS: 100.0000 mg | ORAL_TABLET | Freq: Two times a day (BID) | ORAL | 0 refills | Status: DC

## 2017-05-07 NOTE — Telephone Encounter (Signed)
Marena Chancy aware of order, is working on prior authorization. Landis Gandy, RN   Carlyle Basques, MD  Landis Gandy, RN        Can you help me get a rUQ U/S on [facility]. The order is in but I forgot to mention to the patient that I wanted to get this done as to follow up for her treated hep C

## 2017-05-07 NOTE — Progress Notes (Signed)
Patient is 78 days status post ORIF right bicondylar tibial plateau fracture.  She noticed a superficial dehiscence approximately 2-3 weeks ago.  There is some serous drainage.  Denies any constitutional worsening pain.  She is ambulating with a cane currently.  On exam she does have a superficial dehiscence with serous drainage from the distal end of the medial incision.  There is no erythema or cellulitis or signs of infection.  There is minimal swelling.  Her x-rays show stable fixation and a healed bicondylar tibial plateau fracture.  Wet-to-dry dressings for the dehiscence.  10-day course of doxycycline.  Recheck in 2 weeks.

## 2017-05-21 ENCOUNTER — Encounter (INDEPENDENT_AMBULATORY_CARE_PROVIDER_SITE_OTHER): Payer: Self-pay | Admitting: Orthopaedic Surgery

## 2017-05-21 ENCOUNTER — Ambulatory Visit (INDEPENDENT_AMBULATORY_CARE_PROVIDER_SITE_OTHER): Payer: Medicare HMO | Admitting: Orthopaedic Surgery

## 2017-05-21 DIAGNOSIS — S82141D Displaced bicondylar fracture of right tibia, subsequent encounter for closed fracture with routine healing: Secondary | ICD-10-CM | POA: Diagnosis not present

## 2017-05-21 NOTE — Progress Notes (Signed)
Office Visit Note   Patient: Vanessa Beard           Date of Birth: 01/04/1951           MRN: [id number] Visit Date: 05/21/2017              Requested by: Seward Carol, MD [address] [place], [place] [address] PCP: Seward Carol, MD   Assessment & Plan: Visit Diagnoses:  1. Closed fracture of right tibial plateau with routine healing, subsequent encounter     Plan: Wound is improving.  Continue wet-to-dry dressings.  Follow-up in 4 weeks for wound recheck.  Follow-Up Instructions: Return in about 4 weeks (around 06/18/2017).   Orders:  No orders of the defined types were placed in this encounter.  No orders of the defined types were placed in this encounter.     Procedures: No procedures performed   Clinical Data: No additional findings.   Subjective: Chief Complaint  Patient presents with  . Right Knee - Routine Post Op    HPI Patient comes back today for wound check.  She is doing well with the wet-to-dry dressings. Review of Systems   Objective: Vital Signs: There were no vitals taken for this visit.  Physical Exam  Ortho Exam There is improvement of the wound dehiscence.  There is no cellulitis or signs of infection. Specialty Comments:  No specialty comments available.  Imaging: No results found.   PMFS History: Patient Active Problem List   Diagnosis Date Noted  . Closed fracture of right tibial plateau 02/18/2017  . Benign hypertension 02/15/2017  . Screening examination for venereal disease 03/06/2015  . Encounter for long-term (current) use of medications 03/06/2015  . Elevated LFTs 12/11/2014  . Abnormal ultrasound 12/11/2014  . Abdominal pain, epigastric 12/11/2014  . ASCUS with positive high risk HPV 10/31/2013  . Gall bladder disease 02/03/2013  . 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  . Compression fracture of L3 lumbar vertebra ([place]) 07/01/2011  . Thyroid dysfunction  07/01/2011  . Anemia 07/01/2011  . Sore throat 04/02/2011  . Hyperparathyroidism 02/10/2011  . Osteopenia 02/03/2011  . PAP SMEAR, LGSIL, ABNORMAL 12/21/2009  . COUGH 07/18/2009  . Chronic hepatitis C virus infection ([place]) 12/19/2008  . LEUKORRHEA 04/07/2008  . FATIGUE 04/07/2008  . BACK PAIN, LUMBAR 11/10/2007  . ALLERGIC RHINITIS 07/09/2007  . PNEUMOCYSTIS PNEUMONIA 05/07/2007  . HYPERTENSION 05/07/2007  . DENTAL CARIES 05/07/2007  . INSOMNIA 05/07/2007  . PNEUMONIA, HX OF 05/07/2007  . HERPES ZOSTER, HX OF 05/07/2007  . Human immunodeficiency virus (HIV) disease ([place]) 08/04/2006   Past Medical History:  Diagnosis Date  . Allergic rhinitis   . Biliary colic   . Gall bladder disease \  . Hepatitis C   . Herpes   . HIV infection ([place])   . Hyperparathyroidism ([place])   . Hypertension   . Insomnia   . Osteoporosis   . Pneumonia    2010    Family History  Problem Relation Age of Onset  . Diabetes Mother   . Hypertension Mother     Past Surgical History:  Procedure Laterality Date  . CHOLECYSTECTOMY N/A 01/09/2015   Procedure: LAPAROSCOPIC CHOLECYSTECTOMY;  Surgeon: [name], MD;  Location: [facility];  Service: General;  Laterality: N/A;  . ECTOPIC PREGNANCY SURGERY    . ORIF TIBIA PLATEAU Right 02/18/2017   Procedure: OPEN REDUCTION INTERNAL FIXATION (ORIF) RIGHT TIBIAL PLATEAU;  Surgeon: Leandrew Koyanagi, MD;  Location: [facility];  Service: Orthopedics;  Laterality: Right;  . SHOULDER SURGERY Right 12/2014   Social History   Occupational History  . Occupation: CNA  Tobacco Use  . Smoking status: Never Smoker  . Smokeless tobacco: Never Used  Substance and Sexual Activity  . Alcohol use: Yes    Alcohol/week: 1.8 oz    Types: 3 Standard drinks or equivalent per week    Comment: wine or [place] alcohol.  Occasional  . Drug use: No  . Sexual activity: Yes    Partners: Male    Birth control/protection: Condom    Comment: declined condoms

## 2017-05-22 ENCOUNTER — Ambulatory Visit (HOSPITAL_COMMUNITY): Payer: Medicare HMO

## 2017-05-27 ENCOUNTER — Ambulatory Visit (HOSPITAL_COMMUNITY): Payer: Medicare HMO

## 2017-06-01 ENCOUNTER — Other Ambulatory Visit (INDEPENDENT_AMBULATORY_CARE_PROVIDER_SITE_OTHER): Payer: Self-pay | Admitting: Orthopaedic Surgery

## 2017-06-01 NOTE — Telephone Encounter (Signed)
Refill request

## 2017-06-02 ENCOUNTER — Telehealth: Payer: Self-pay | Admitting: *Deleted

## 2017-06-02 ENCOUNTER — Ambulatory Visit (HOSPITAL_COMMUNITY)
Admission: RE | Admit: 2017-06-02 | Discharge: 2017-06-02 | Disposition: A | Discharge status: Home / Self Care | Payer: Medicare HMO | Source: Ambulatory Visit | Attending: Internal Medicine | Admitting: Internal Medicine

## 2017-06-02 DIAGNOSIS — Z9049 Acquired absence of other specified parts of digestive tract: Secondary | ICD-10-CM | POA: Insufficient documentation

## 2017-06-02 DIAGNOSIS — B182 Chronic viral hepatitis C: Secondary | ICD-10-CM | POA: Insufficient documentation

## 2017-06-02 DIAGNOSIS — K7291 Hepatic failure, unspecified with coma: Secondary | ICD-10-CM | POA: Diagnosis not present

## 2017-06-02 NOTE — Telephone Encounter (Signed)
Patient has a question about why she needs abdominal ultrasound.  Returned patient's call.  Asked patient to return call.

## 2017-06-03 NOTE — Telephone Encounter (Signed)
Patient returned call, had the ABD U/S 06/02/17.  RN advised the U/S was for "Chronic hepatitis c without hepatic coma = will need to get [place] for [facility] surveillance" per Dr. Baxter Flattery.  RN advised that the patient will be able to see results in [place] in a few days.  Patient verbalized understanding.

## 2017-06-05 ENCOUNTER — Telehealth (INDEPENDENT_AMBULATORY_CARE_PROVIDER_SITE_OTHER): Payer: Self-pay

## 2017-06-05 NOTE — Telephone Encounter (Signed)
Patient called checking the status for a PA for Meloxicam.  Cb# is [phone].  Please advise.  Thank You.

## 2017-06-08 ENCOUNTER — Telehealth (INDEPENDENT_AMBULATORY_CARE_PROVIDER_SITE_OTHER): Payer: Self-pay | Admitting: Orthopaedic Surgery

## 2017-06-08 NOTE — Telephone Encounter (Signed)
Called patient back we have not received a Fax regarding PA, she will call pharm and they will fax [place] something. Gave her our fax number [phone]

## 2017-06-08 NOTE — Telephone Encounter (Signed)
Patient called asking if the prior authorization has been done for her Meloxicam. CB # [phone]

## 2017-06-08 NOTE — Telephone Encounter (Signed)
See other message

## 2017-06-08 NOTE — Telephone Encounter (Signed)
We have not received a PA request

## 2017-06-08 NOTE — Telephone Encounter (Signed)
Called patient answered and hung up.

## 2017-06-09 ENCOUNTER — Telehealth (INDEPENDENT_AMBULATORY_CARE_PROVIDER_SITE_OTHER): Payer: Self-pay

## 2017-06-09 NOTE — Telephone Encounter (Signed)
Patient would like to know if PA has been received for Meloxicam from pharmacy.  Cb# is [phone].  Please advise.  Thank you.

## 2017-06-10 ENCOUNTER — Telehealth (INDEPENDENT_AMBULATORY_CARE_PROVIDER_SITE_OTHER): Payer: Self-pay

## 2017-06-10 ENCOUNTER — Other Ambulatory Visit (INDEPENDENT_AMBULATORY_CARE_PROVIDER_SITE_OTHER): Payer: Self-pay

## 2017-06-10 MED ORDER — MELOXICAM 15 MG PO TABS: 15.0000 mg | ORAL_TABLET | Freq: Every day | ORAL | 0 refills | Status: DC

## 2017-06-10 NOTE — Telephone Encounter (Signed)
Patient called and would like a refill on Meloxicam.

## 2017-06-10 NOTE — Telephone Encounter (Signed)
Called the pharm and they state Rx does not need a PA it just doesn't have any more refills. Sent a message to Dr Erlinda Hong to see if he will approve for Refill.

## 2017-06-10 NOTE — Telephone Encounter (Signed)
Sent to the pharm

## 2017-06-10 NOTE — Telephone Encounter (Signed)
Yes #30

## 2017-06-25 ENCOUNTER — Other Ambulatory Visit (INDEPENDENT_AMBULATORY_CARE_PROVIDER_SITE_OTHER): Payer: Self-pay | Admitting: Orthopaedic Surgery

## 2017-06-30 ENCOUNTER — Telehealth: Payer: Self-pay

## 2017-06-30 DIAGNOSIS — B20 Human immunodeficiency virus [HIV] disease: Secondary | ICD-10-CM

## 2017-06-30 MED ORDER — ABACAVIR-DOLUTEGRAVIR-LAMIVUD 600-50-300 MG PO TABS: 1.0000 | ORAL_TABLET | Freq: Every day | ORAL | 5 refills | Status: DC

## 2017-06-30 NOTE — Telephone Encounter (Signed)
Patient called to request refill via voice mail.  Unsure of which  medication she is requesting.  Left message to call our office.

## 2017-07-01 ENCOUNTER — Other Ambulatory Visit: Payer: Self-pay | Admitting: *Deleted

## 2017-07-01 ENCOUNTER — Other Ambulatory Visit: Payer: Self-pay | Admitting: Internal Medicine

## 2017-07-01 DIAGNOSIS — G4700 Insomnia, unspecified: Secondary | ICD-10-CM

## 2017-07-01 MED ORDER — ZOLPIDEM TARTRATE 10 MG PO TABS: 10.0000 mg | ORAL_TABLET | Freq: Every evening | ORAL | 1 refills | Status: DC | PRN

## 2017-07-03 ENCOUNTER — Telehealth: Payer: Self-pay | Admitting: Behavioral Health

## 2017-07-03 NOTE — Telephone Encounter (Signed)
Writer returned patient's phone call and patient just wanted to know if the prior authorization had been approved for her Zolpidem.  Writer notified patient  that we are still waiting for the approval.  Patient verbalized understanding and had no additional questions. Pricilla Riffle RN

## 2017-07-06 NOTE — Telephone Encounter (Signed)
Submitted PA though cover my meds just waiting on a response. Should hear back within 24-48 hours.  [place]

## 2017-07-07 NOTE — Telephone Encounter (Signed)
PA denied on 07/07/17 requesting additional information. Faxed information today just waiting on another response. [place]

## 2017-07-09 ENCOUNTER — Ambulatory Visit (INDEPENDENT_AMBULATORY_CARE_PROVIDER_SITE_OTHER): Payer: Medicare HMO | Admitting: Orthopaedic Surgery

## 2017-07-09 NOTE — Telephone Encounter (Signed)
[employer] called for additional information regarding prior authorization. Per chart records, patient has been on ambien 10mg  since establishing care at [facility] in 2008. Landis Gandy, RN

## 2017-07-21 ENCOUNTER — Encounter (INDEPENDENT_AMBULATORY_CARE_PROVIDER_SITE_OTHER): Payer: Self-pay | Admitting: Orthopaedic Surgery

## 2017-07-21 ENCOUNTER — Ambulatory Visit (INDEPENDENT_AMBULATORY_CARE_PROVIDER_SITE_OTHER): Payer: Medicare HMO | Admitting: Orthopaedic Surgery

## 2017-07-21 DIAGNOSIS — S82141D Displaced bicondylar fracture of right tibia, subsequent encounter for closed fracture with routine healing: Secondary | ICD-10-CM | POA: Diagnosis not present

## 2017-07-21 MED ORDER — IBUPROFEN 800 MG PO TABS: 800.0000 mg | ORAL_TABLET | Freq: Three times a day (TID) | ORAL | 2 refills | Status: DC | PRN

## 2017-07-21 NOTE — Progress Notes (Signed)
Office Visit Note   Patient: Vanessa Beard           Date of Birth: 08-Sep-1950           MRN: [id number] Visit Date: 07/21/2017              Requested by: Seward Carol, MD [address] [place], [place] [address] PCP: Seward Carol, MD   Assessment & Plan: Visit Diagnoses:  1. Closed fracture of right tibial plateau with routine healing, subsequent encounter     Plan: At this point patient has healed her wound she is doing well.  I am not concerned about infection.  I refilled her ibuprofen to take as needed.  Follow-up as needed.  Questions encouraged and answered.  Follow-Up Instructions: Return if symptoms worsen or fail to improve.   Orders:  No orders of the defined types were placed in this encounter.  Meds ordered this encounter  Medications  . ibuprofen (ADVIL,MOTRIN) 800 MG tablet    Sig: Take 1 tablet (800 mg total) by mouth every 8 (eight) hours as needed.    Dispense:  30 tablet    Refill:  2      Procedures: No procedures performed   Clinical Data: No additional findings.   Subjective: Chief Complaint  Patient presents with  . Right Knee - Pain    Vanessa Beard is 5 months status post ORIF bicondylar tibial plateau fracture.  She is doing part-time work.  Overall she is happy.  She has finished her antibiotics for a while now.    Review of Systems   Objective: Vital Signs: There were no vitals taken for this visit.  Physical Exam  Ortho Exam Right knee exam shows a fully healed surgical scar with a healed wound.  There is no drainage or cellulitis or evidence of infection.  There is no fluctuance. Specialty Comments:  No specialty comments available.  Imaging: No results found.   PMFS History: Patient Active Problem List   Diagnosis Date Noted  . Closed fracture of right tibial plateau 02/18/2017  . Benign hypertension 02/15/2017  . Screening examination for venereal disease 03/06/2015  . Encounter for long-term  (current) use of medications 03/06/2015  . Elevated LFTs 12/11/2014  . Abnormal ultrasound 12/11/2014  . Abdominal pain, epigastric 12/11/2014  . ASCUS with positive high risk HPV 10/31/2013  . Gall bladder disease 02/03/2013  . 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  . Compression fracture of L3 lumbar vertebra ([place]) 07/01/2011  . Thyroid dysfunction 07/01/2011  . Anemia 07/01/2011  . Sore throat 04/02/2011  . Hyperparathyroidism 02/10/2011  . Osteopenia 02/03/2011  . PAP SMEAR, LGSIL, ABNORMAL 12/21/2009  . COUGH 07/18/2009  . Chronic hepatitis C virus infection ([place]) 12/19/2008  . LEUKORRHEA 04/07/2008  . FATIGUE 04/07/2008  . BACK PAIN, LUMBAR 11/10/2007  . ALLERGIC RHINITIS 07/09/2007  . PNEUMOCYSTIS PNEUMONIA 05/07/2007  . HYPERTENSION 05/07/2007  . DENTAL CARIES 05/07/2007  . INSOMNIA 05/07/2007  . PNEUMONIA, HX OF 05/07/2007  . HERPES ZOSTER, HX OF 05/07/2007  . Human immunodeficiency virus (HIV) disease ([place]) 08/04/2006   Past Medical History:  Diagnosis Date  . Allergic rhinitis   . Biliary colic   . Gall bladder disease \  . Hepatitis C   . Herpes   . HIV infection ([place])   . Hyperparathyroidism ([place])   . Hypertension   . Insomnia   . Osteoporosis   . Pneumonia    2010    Family  History  Problem Relation Age of Onset  . Diabetes Mother   . Hypertension Mother     Past Surgical History:  Procedure Laterality Date  . CHOLECYSTECTOMY N/A 01/09/2015   Procedure: LAPAROSCOPIC CHOLECYSTECTOMY;  Surgeon: [name], MD;  Location: [facility];  Service: General;  Laterality: N/A;  . ECTOPIC PREGNANCY SURGERY    . ORIF TIBIA PLATEAU Right 02/18/2017   Procedure: OPEN REDUCTION INTERNAL FIXATION (ORIF) RIGHT TIBIAL PLATEAU;  Surgeon: Leandrew Koyanagi, MD;  Location: [place] [facility];  Service: Orthopedics;  Laterality: Right;  . SHOULDER SURGERY Right 12/2014   Social History   Occupational History  . Occupation: CNA  Tobacco Use  . Smoking  status: Never Smoker  . Smokeless tobacco: Never Used  Substance and Sexual Activity  . Alcohol use: Yes    Alcohol/week: 1.8 oz    Types: 3 Standard drinks or equivalent per week    Comment: wine or [place] alcohol.  Occasional  . Drug use: No  . Sexual activity: Yes    Partners: Male    Birth control/protection: Condom    Comment: declined condoms

## 2017-08-03 ENCOUNTER — Ambulatory Visit (INDEPENDENT_AMBULATORY_CARE_PROVIDER_SITE_OTHER): Payer: Medicare HMO | Admitting: Internal Medicine

## 2017-08-03 ENCOUNTER — Encounter: Payer: Self-pay | Admitting: Internal Medicine

## 2017-08-03 VITALS — BP 142/92 | HR 79 | Temp 98.3°F | Wt 156.0 lb

## 2017-08-03 DIAGNOSIS — B20 Human immunodeficiency virus [HIV] disease: Secondary | ICD-10-CM

## 2017-08-03 DIAGNOSIS — F5101 Primary insomnia: Secondary | ICD-10-CM | POA: Diagnosis not present

## 2017-08-03 DIAGNOSIS — Z9119 Patient's noncompliance with other medical treatment and regimen: Secondary | ICD-10-CM

## 2017-08-03 DIAGNOSIS — B182 Chronic viral hepatitis C: Secondary | ICD-10-CM | POA: Diagnosis not present

## 2017-08-03 DIAGNOSIS — Z91199 Patient's noncompliance with other medical treatment and regimen due to unspecified reason: Secondary | ICD-10-CM

## 2017-08-03 LAB — COMPLETE METABOLIC PANEL WITH GFR
AG Ratio: 0.9 (calc) — ABNORMAL LOW (ref 1.0–2.5)
ALT: 8 U/L (ref 6–29)
AST: 21 U/L (ref 10–35)
Albumin: 3.8 g/dL (ref 3.6–5.1)
Alkaline phosphatase (APISO): 80 U/L (ref 33–130)
BUN: 22 mg/dL (ref 7–25)
CO2: 27 mmol/L (ref 20–32)
Calcium: 9.8 mg/dL (ref 8.6–10.4)
Chloride: 107 mmol/L (ref 98–110)
Creat: 0.92 mg/dL (ref 0.50–0.99)
GFR, Est African American: 74 mL/min/{1.73_m2} (ref >=60)
GFR, Est Non African American: 64 mL/min/{1.73_m2} (ref >=60)
Globulin: 4.1 g/dL (calc) — ABNORMAL HIGH (ref 1.9–3.7)
Glucose, Bld: 82 mg/dL (ref 65–99)
Potassium: 4 mmol/L (ref 3.5–5.3)
Sodium: 139 mmol/L (ref 135–146)
Total Bilirubin: 0.5 mg/dL (ref 0.2–1.2)
Total Protein: 7.9 g/dL (ref 6.1–8.1)

## 2017-08-03 LAB — CBC WITH DIFFERENTIAL/PLATELET
Basophils Absolute: 32 cells/uL (ref 0–200)
Basophils Relative: 0.8 %
Eosinophils Absolute: 112 cells/uL (ref 15–500)
Eosinophils Relative: 2.8 %
HCT: 35.2 % (ref 35.0–45.0)
Hemoglobin: 11.9 g/dL (ref 11.7–15.5)
Lymphs Abs: 1112 cells/uL (ref 850–3900)
MCH: 29 pg (ref 27.0–33.0)
MCHC: 33.8 g/dL (ref 32.0–36.0)
MCV: 85.9 fL (ref 80.0–100.0)
MPV: 11.9 fL (ref 7.5–12.5)
Monocytes Relative: 11.3 %
Neutro Abs: 2292 cells/uL (ref 1500–7800)
Neutrophils Relative %: 57.3 %
Platelets: 173 10*3/uL (ref 140–400)
RBC: 4.1 10*6/uL (ref 3.80–5.10)
RDW: 13.9 % (ref 11.0–15.0)
Total Lymphocyte: 27.8 %
WBC mixed population: 452 cells/uL (ref 200–950)
WBC: 4 10*3/uL (ref 3.8–10.8)

## 2017-08-03 NOTE — Progress Notes (Signed)
RFV: follow up for hiv disease  Patient ID: Vanessa Beard, female   DOB: 1950-08-11, 67 y.o.   MRN: [id number]  HPI 67yo F with hiv disease-treated chronic hepatitis C, cd 4 count of of 160/VL<20 in Dec, currently on triumeq.  She reports that she is only taking triumeq 4 times per week due ot size of pill.   She also reports that she would like something for her insomnia though she reports using computers for her classwork late into the night. She also has late day/early evening sleepiness for which she takes a nap. She state she is unable to change her pattern due ot her online class and work schedule. Outpatient Encounter Medications as of 08/03/2017  Medication Sig  . abacavir-dolutegravir-lamiVUDine (TRIUMEQ) 600-50-300 MG tablet Take 1 tablet by mouth daily.  Marland Kitchen amLODipine (NORVASC) 10 MG tablet Take 1 tablet (10 mg total) by mouth daily.  Marland Kitchen aspirin EC 325 MG EC tablet Take 1 tablet (325 mg total) by mouth 2 (two) times daily.  . Chlorhexidine Gluconate Cloth 2 % PADS Apply 6 each topically daily at 6 (six) AM.  . HYDROcodone-acetaminophen (NORCO/VICODIN) 5-325 MG tablet Take 1 tablet by mouth daily as needed for severe pain.  Marland Kitchen ibuprofen (ADVIL,MOTRIN) 800 MG tablet TAKE 1 TABLET BY MOUTH THREE TIMES DAILY AS NEEDED  . ibuprofen (ADVIL,MOTRIN) 800 MG tablet Take 1 tablet (800 mg total) by mouth every 8 (eight) hours as needed.  . meloxicam (MOBIC) 15 MG tablet TAKE 1 TABLET BY MOUTH DAILY WITH BREAKFAST  . Multiple Vitamins-Minerals (ADULT GUMMY) CHEW Chew 3 capsules by mouth daily.  . mupirocin ointment (BACTROBAN) 2 % Place 1 application 2 (two) times daily into the nose.  . polyethylene glycol (MIRALAX / GLYCOLAX) packet Take 17 g by mouth daily as needed for mild constipation.  Marland Kitchen zolpidem (AMBIEN) 10 MG tablet Take 1 tablet (10 mg total) by mouth at bedtime as needed.  . [DISCONTINUED] doxycycline (VIBRA-TABS) 100 MG tablet Take 1 tablet (100 mg total) by mouth 2 (two) times  daily.  . [DISCONTINUED] methocarbamol (ROBAXIN) 500 MG tablet Take 1 tablet (500 mg total) by mouth every 6 (six) hours as needed for muscle spasms.  . [DISCONTINUED] oxyCODONE (OXY IR/ROXICODONE) 5 MG immediate release tablet Take 1-3 tablets (5-15 mg total) by mouth every 3 (three) hours as needed for breakthrough pain.   No facility-administered encounter medications on file as of 08/03/2017.      Patient Active Problem List   Diagnosis Date Noted  . Closed fracture of right tibial plateau 02/18/2017  . Benign hypertension 02/15/2017  . Screening examination for venereal disease 03/06/2015  . Encounter for long-term (current) use of medications 03/06/2015  . Elevated LFTs 12/11/2014  . Abnormal ultrasound 12/11/2014  . Abdominal pain, epigastric 12/11/2014  . ASCUS with positive high risk HPV 10/31/2013  . Gall bladder disease 02/03/2013  . 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  . Compression fracture of L3 lumbar vertebra ([place]) 07/01/2011  . Thyroid dysfunction 07/01/2011  . Anemia 07/01/2011  . Sore throat 04/02/2011  . Hyperparathyroidism 02/10/2011  . Osteopenia 02/03/2011  . PAP SMEAR, LGSIL, ABNORMAL 12/21/2009  . COUGH 07/18/2009  . Chronic hepatitis C virus infection ([place]) 12/19/2008  . LEUKORRHEA 04/07/2008  . FATIGUE 04/07/2008  . BACK PAIN, LUMBAR 11/10/2007  . ALLERGIC RHINITIS 07/09/2007  . PNEUMOCYSTIS PNEUMONIA 05/07/2007  . HYPERTENSION 05/07/2007  . DENTAL CARIES 05/07/2007  . INSOMNIA 05/07/2007  . PNEUMONIA, HX OF 05/07/2007  . HERPES  ZOSTER, HX OF 05/07/2007  . Human immunodeficiency virus (HIV) disease ([place]) 08/04/2006     Health Maintenance Due  Topic Date Due  . COLONOSCOPY  03/08/1999  . PNA vac Low Risk Adult (1 of 2 - PCV13) 03/07/2014    Social History   Tobacco Use  . Smoking status: Never Smoker  . Smokeless tobacco: Never Used  Substance Use Topics  . Alcohol use: Yes    Alcohol/week: 1.8 oz    Types: 3  Standard drinks or equivalent per week    Comment: wine or [place] alcohol.  Occasional  . Drug use: No   Review of Systems +insomnia, otherwise 12 point ros is negative Physical Exam   BP (!) 142/92   Pulse 79   Temp 98.3 F (36.8 C) (Oral)   Wt 156 lb (70.8 kg)   BMI 30.47 kg/m   Physical Exam  Constitutional:  oriented to person, place, and time. appears well-developed and well-nourished. No distress.  HENT: [place]/AT, PERRLA, no scleral icterus Mouth/Throat: Oropharynx is clear and moist. No oropharyngeal exudate.  Cardiovascular: Normal rate, regular rhythm and normal heart sounds. Exam reveals no gallop and no friction rub.  No murmur heard.  Pulmonary/Chest: Effort normal and breath sounds normal. No respiratory distress.  has no wheezes.  Neck = supple, no nuchal rigidity Abdominal: Soft. Bowel sounds are normal.  exhibits no distension. There is no tenderness.  Lymphadenopathy: no cervical adenopathy. No axillary adenopathy Neurological: alert and oriented to person, place, and time.  Skin: vetiligo to extremities Psychiatric: a normal mood and affect.  behavior is normal.   Lab Results  Component Value Date   CD4TCELL 12 (L) 05/04/2017   Lab Results  Component Value Date   CD4TABS 160 (L) 05/04/2017   CD4TABS 190 (L) 01/12/2017   CD4TABS 270 (L) 07/29/2016   Lab Results  Component Value Date   HIV1RNAQUANT <20 NOT DETECTED 05/04/2017   Lab Results  Component Value Date   HEPBSAB NEG 12/29/2013   Lab Results  Component Value Date   LABRPR NON REAC 01/12/2017    CBC Lab Results  Component Value Date   WBC 4.0 05/04/2017   RBC 4.33 05/04/2017   HGB 12.8 05/04/2017   HCT 38.0 05/04/2017   PLT 184 05/04/2017   MCV 87.8 05/04/2017   MCH 29.6 05/04/2017   MCHC 33.7 05/04/2017   RDW 12.4 05/04/2017   LYMPHSABS 1,156 05/04/2017   MONOABS 0.3 02/15/2017   EOSABS 48 05/04/2017    BMET Lab Results  Component Value Date   NA 137 05/04/2017   K 4.0  05/04/2017   CL 103 05/04/2017   CO2 30 05/04/2017   GLUCOSE 82 05/04/2017   BUN 12 05/04/2017   CREATININE 0.80 05/04/2017   CALCIUM 9.4 05/04/2017   GFRNONAA 76 05/04/2017   GFRAA 88 05/04/2017      Assessment and Plan  hiv disease= viral suppression with triumeq but her overall cd 4 count remains low. < 200. If she has detectable VL will change up her regimen due to pill size. May need pi based regimen. Spent 10 min with adherence counseling  Hx of chronic hep c without hepatic coma = she had RUQ [place] in January with no lesions.  oi proph = with atovaquone since she has dapsone and sulfa allergy  Insomnia = insurance has not approved ambien. Has tried trazadone and melatonin in the past without improvement. We talked about improving sleep hygiene. Less blue light/computer prior to bedtime. Less napping  in the afternoon. Meditation through headspace app.  Needs QTF for her job

## 2017-08-05 LAB — QUANTIFERON-TB GOLD PLUS
Mitogen-NIL: >10 IU/mL
NIL: 0.03 IU/mL
QuantiFERON-TB Gold Plus: NEGATIVE
TB1-NIL: 0 IU/mL
TB2-NIL: 0 IU/mL

## 2017-08-05 LAB — T-HELPER CELL (CD4) - (RCID CLINIC ONLY)
CD4 % Helper T Cell: 18 % — ABNORMAL LOW (ref 33–55)
CD4 T Cell Abs: 260 /uL — ABNORMAL LOW (ref 400–2700)

## 2017-08-05 LAB — HIV-1 RNA ULTRAQUANT REFLEX TO GENTYP+
HIV 1 RNA Quant: <20 Copies/mL
HIV-1 RNA Quant, Log: <1.3 Log cps/mL

## 2017-08-24 ENCOUNTER — Other Ambulatory Visit (INDEPENDENT_AMBULATORY_CARE_PROVIDER_SITE_OTHER): Payer: Self-pay | Admitting: Orthopaedic Surgery

## 2017-08-24 NOTE — Telephone Encounter (Signed)
yes

## 2017-08-27 ENCOUNTER — Other Ambulatory Visit: Payer: Self-pay | Admitting: Internal Medicine

## 2017-08-27 DIAGNOSIS — Z1231 Encounter for screening mammogram for malignant neoplasm of breast: Secondary | ICD-10-CM

## 2017-09-07 ENCOUNTER — Telehealth (INDEPENDENT_AMBULATORY_CARE_PROVIDER_SITE_OTHER): Payer: Self-pay | Admitting: Orthopaedic Surgery

## 2017-09-07 NOTE — Telephone Encounter (Signed)
Patient said she wanted to talk to Dr. Erlinda Hong about her right knee, he did surgery on it in October but says it is swelling. She said Mendel Ryder could give her a call back when she gets the chance. CB # [phone]

## 2017-09-08 NOTE — Telephone Encounter (Signed)
See message.

## 2017-09-08 NOTE — Telephone Encounter (Signed)
Left vm to call me back

## 2017-09-09 ENCOUNTER — Other Ambulatory Visit (INDEPENDENT_AMBULATORY_CARE_PROVIDER_SITE_OTHER): Payer: Self-pay | Admitting: Physician Assistant

## 2017-09-09 MED ORDER — NAPROXEN 500 MG PO TABS: 500.0000 mg | ORAL_TABLET | Freq: Two times a day (BID) | ORAL | 2 refills | Status: DC

## 2017-10-13 ENCOUNTER — Ambulatory Visit
Admission: RE | Admit: 2017-10-13 | Discharge: 2017-10-13 | Disposition: A | Discharge status: Home / Self Care | Payer: Medicare HMO | Source: Ambulatory Visit | Attending: Internal Medicine | Admitting: Internal Medicine

## 2017-10-13 DIAGNOSIS — Z1231 Encounter for screening mammogram for malignant neoplasm of breast: Secondary | ICD-10-CM | POA: Diagnosis not present

## 2017-10-14 DIAGNOSIS — M810 Age-related osteoporosis without current pathological fracture: Secondary | ICD-10-CM | POA: Diagnosis not present

## 2017-10-15 DIAGNOSIS — I10 Essential (primary) hypertension: Secondary | ICD-10-CM | POA: Diagnosis not present

## 2017-10-15 DIAGNOSIS — B20 Human immunodeficiency virus [HIV] disease: Secondary | ICD-10-CM | POA: Diagnosis not present

## 2017-10-15 DIAGNOSIS — M810 Age-related osteoporosis without current pathological fracture: Secondary | ICD-10-CM | POA: Diagnosis not present

## 2017-11-16 ENCOUNTER — Encounter: Payer: Self-pay | Admitting: Internal Medicine

## 2017-11-16 ENCOUNTER — Ambulatory Visit (INDEPENDENT_AMBULATORY_CARE_PROVIDER_SITE_OTHER): Payer: Medicare HMO | Admitting: Internal Medicine

## 2017-11-16 VITALS — BP 154/99 | HR 78 | Temp 98.0°F | Wt 161.0 lb

## 2017-11-16 DIAGNOSIS — Z23 Encounter for immunization: Secondary | ICD-10-CM | POA: Diagnosis not present

## 2017-11-16 DIAGNOSIS — M19011 Primary osteoarthritis, right shoulder: Secondary | ICD-10-CM

## 2017-11-16 DIAGNOSIS — M19012 Primary osteoarthritis, left shoulder: Secondary | ICD-10-CM

## 2017-11-16 DIAGNOSIS — B20 Human immunodeficiency virus [HIV] disease: Secondary | ICD-10-CM | POA: Diagnosis not present

## 2017-11-16 MED ORDER — PNEUMOCOCCAL 13-VAL CONJ VACC IM SUSP
0.5000 mL | INTRAMUSCULAR | Status: AC
  Administered 2017-11-16: 0.5 mL via INTRAMUSCULAR

## 2017-11-16 NOTE — Progress Notes (Signed)
RFV: follow up for hiv disease  Patient ID: Vanessa Beard, female   DOB: 1950-06-10, 67 y.o.   MRN: [id number]  HPI 67yo F with hiv disease, CD 4 count of 260/VL<20 on triumeq. She reports doing well. She is aiming to get back to taking classes this fall.denies any recent illnesses. Taking her medications routinely. She has ongoing arthritis mostly her shoulder and noticing to having occasional locked ring finger  Outpatient Encounter Medications as of 11/16/2017  Medication Sig  . abacavir-dolutegravir-lamiVUDine (TRIUMEQ) 600-50-300 MG tablet Take 1 tablet by mouth daily.  Marland Kitchen amLODipine (NORVASC) 10 MG tablet Take 1 tablet (10 mg total) by mouth daily.  Marland Kitchen aspirin EC 325 MG EC tablet Take 1 tablet (325 mg total) by mouth 2 (two) times daily.  . Chlorhexidine Gluconate Cloth 2 % PADS Apply 6 each topically daily at 6 (six) AM.  . HYDROcodone-acetaminophen (NORCO/VICODIN) 5-325 MG tablet Take 1 tablet by mouth daily as needed for severe pain.  Marland Kitchen ibuprofen (ADVIL,MOTRIN) 800 MG tablet TAKE 1 TABLET BY MOUTH THREE TIMES DAILY AS NEEDED  . ibuprofen (ADVIL,MOTRIN) 800 MG tablet Take 1 tablet (800 mg total) by mouth every 8 (eight) hours as needed.  . meloxicam (MOBIC) 15 MG tablet TAKE 1 TABLET BY MOUTH DAILY WITH BREAKFAST  . Multiple Vitamins-Minerals (ADULT GUMMY) CHEW Chew 3 capsules by mouth daily.  . mupirocin ointment (BACTROBAN) 2 % Place 1 application 2 (two) times daily into the nose.  . naproxen (NAPROSYN) 500 MG tablet Take 1 tablet (500 mg total) by mouth 2 (two) times daily with a meal.  . polyethylene glycol (MIRALAX / GLYCOLAX) packet Take 17 g by mouth daily as needed for mild constipation.  Marland Kitchen zolpidem (AMBIEN) 10 MG tablet Take 1 tablet (10 mg total) by mouth at bedtime as needed.   No facility-administered encounter medications on file as of 11/16/2017.      Patient Active Problem List   Diagnosis Date Noted  . Closed fracture of right tibial plateau 02/18/2017  . Benign  hypertension 02/15/2017  . Screening examination for venereal disease 03/06/2015  . Encounter for long-term (current) use of medications 03/06/2015  . Elevated LFTs 12/11/2014  . Abnormal ultrasound 12/11/2014  . Abdominal pain, epigastric 12/11/2014  . ASCUS with positive high risk HPV 10/31/2013  . Gall bladder disease 02/03/2013  . 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  . Compression fracture of L3 lumbar vertebra 07/01/2011  . Thyroid dysfunction 07/01/2011  . Anemia 07/01/2011  . Sore throat 04/02/2011  . Hyperparathyroidism 02/10/2011  . Osteopenia 02/03/2011  . PAP SMEAR, LGSIL, ABNORMAL 12/21/2009  . COUGH 07/18/2009  . Chronic hepatitis C virus infection ([place]) 12/19/2008  . LEUKORRHEA 04/07/2008  . FATIGUE 04/07/2008  . BACK PAIN, LUMBAR 11/10/2007  . ALLERGIC RHINITIS 07/09/2007  . PNEUMOCYSTIS PNEUMONIA 05/07/2007  . HYPERTENSION 05/07/2007  . DENTAL CARIES 05/07/2007  . INSOMNIA 05/07/2007  . PNEUMONIA, HX OF 05/07/2007  . HERPES ZOSTER, HX OF 05/07/2007  . Human immunodeficiency virus (HIV) disease ([place]) 08/04/2006     Health Maintenance Due  Topic Date Due  . PNA vac Low Risk Adult (1 of 2 - PCV13) 03/07/2014     Review of Systems Review of Systems  Constitutional: Negative for fever, chills, diaphoresis, activity change, appetite change, fatigue and unexpected weight change.  HENT: Negative for congestion, sore throat, rhinorrhea, sneezing, trouble swallowing and sinus pressure.  Eyes: Negative for photophobia and visual disturbance.  Respiratory: Negative for cough, chest tightness, shortness of  breath, wheezing and stridor.  Cardiovascular: Negative for chest pain, palpitations and leg swelling.  Gastrointestinal: Negative for nausea, vomiting, abdominal pain, diarrhea, constipation, blood in stool, abdominal distention and anal bleeding.  Genitourinary: Negative for dysuria, hematuria, flank pain and difficulty urinating.    Musculoskeletal: + osteoarthritis Negative for myalgias, back pain, joint swelling,and gait problem.  Skin: Negative for color change, pallor, rash and wound.  Neurological: Negative for dizziness, tremors, weakness and light-headedness.  Hematological: Negative for adenopathy. Does not bruise/bleed easily.  Psychiatric/Behavioral: Negative for behavioral problems, confusion, sleep disturbance, dysphoric mood, decreased concentration and agitation.    Physical Exam   BP (!) 154/99   Pulse 78   Temp 98 F (36.7 C) (Oral)   Wt 161 lb (73 kg)   BMI 31.44 kg/m   Physical Exam  Constitutional:  oriented to person, place, and time. appears well-developed and well-nourished. No distress.  HENT: [place]/AT, PERRLA, no scleral icterus Mouth/Throat: Oropharynx is clear and moist. No oropharyngeal exudate.  Cardiovascular: Normal rate, regular rhythm and normal heart sounds. Exam reveals no gallop and no friction rub.  No murmur heard.  Pulmonary/Chest: Effort normal and breath sounds normal. No respiratory distress.  has no wheezes.  Neck = supple, no nuchal rigidity Abdominal: Soft. Bowel sounds are normal.  exhibits no distension. There is no tenderness.  Lymphadenopathy: no cervical adenopathy. No axillary adenopathy Neurological: alert and oriented to person, place, and time.  Skin: Skin is warm and dry. No rash noted. No erythema.  Psychiatric: a normal mood and affect.  behavior is normal.   Lab Results  Component Value Date   CD4TCELL 18 (L) 08/03/2017   Lab Results  Component Value Date   CD4TABS 260 (L) 08/03/2017   CD4TABS 160 (L) 05/04/2017   CD4TABS 190 (L) 01/12/2017   Lab Results  Component Value Date   HIV1RNAQUANT <20 08/03/2017   Lab Results  Component Value Date   HEPBSAB NEG 12/29/2013   Lab Results  Component Value Date   LABRPR NON REAC 01/12/2017    CBC Lab Results  Component Value Date   WBC 4.0 08/03/2017   RBC 4.10 08/03/2017   HGB 11.9 08/03/2017    HCT 35.2 08/03/2017   PLT 173 08/03/2017   MCV 85.9 08/03/2017   MCH 29.0 08/03/2017   MCHC 33.8 08/03/2017   RDW 13.9 08/03/2017   LYMPHSABS 1,112 08/03/2017   MONOABS 0.3 02/15/2017   EOSABS 112 08/03/2017    BMET Lab Results  Component Value Date   NA 139 08/03/2017   K 4.0 08/03/2017   CL 107 08/03/2017   CO2 27 08/03/2017   GLUCOSE 82 08/03/2017   BUN 22 08/03/2017   CREATININE 0.92 08/03/2017   CALCIUM 9.8 08/03/2017   GFRNONAA 64 08/03/2017   GFRAA 74 08/03/2017      Assessment and Plan hiv disease = well controlled. Continue on triumeq  Trigger finger on right hand/ring finger = refer to sports medicine  Left shoulder arthritis = would do naprosyn daily x 10d t osee if any benefit  Health promotion prevnar 13 today

## 2017-11-24 ENCOUNTER — Telehealth (INDEPENDENT_AMBULATORY_CARE_PROVIDER_SITE_OTHER): Payer: Self-pay | Admitting: Orthopaedic Surgery

## 2017-11-24 NOTE — Telephone Encounter (Signed)
Why?  

## 2017-11-24 NOTE — Telephone Encounter (Signed)
See message.

## 2017-11-24 NOTE — Telephone Encounter (Signed)
Surgery  Open Reduction Internal Fixation(ORIF) Right tibial plateau(Right Knee)   Prescription Request For boot on right leg

## 2017-11-25 NOTE — Telephone Encounter (Signed)
Called patient to see why she wants a CAM Boot. No answer LMOM to return our call to see why she needs boot.

## 2017-11-30 NOTE — Telephone Encounter (Signed)
Pt requested to talk to the nurse

## 2017-12-01 ENCOUNTER — Telehealth (INDEPENDENT_AMBULATORY_CARE_PROVIDER_SITE_OTHER): Payer: Self-pay

## 2017-12-01 ENCOUNTER — Other Ambulatory Visit (INDEPENDENT_AMBULATORY_CARE_PROVIDER_SITE_OTHER): Payer: Self-pay

## 2017-12-01 NOTE — Telephone Encounter (Signed)
Patient will come in today after 3 to pick up CAM BOOT

## 2017-12-01 NOTE — Telephone Encounter (Signed)
Patient would like a CAM Boot for her foot/leg.   CB [phone]

## 2017-12-01 NOTE — Telephone Encounter (Signed)
ok 

## 2017-12-09 ENCOUNTER — Other Ambulatory Visit (INDEPENDENT_AMBULATORY_CARE_PROVIDER_SITE_OTHER): Payer: Self-pay | Admitting: Orthopaedic Surgery

## 2017-12-16 ENCOUNTER — Ambulatory Visit (INDEPENDENT_AMBULATORY_CARE_PROVIDER_SITE_OTHER): Payer: Medicare HMO | Admitting: Orthopaedic Surgery

## 2017-12-18 ENCOUNTER — Ambulatory Visit (INDEPENDENT_AMBULATORY_CARE_PROVIDER_SITE_OTHER): Payer: Medicare HMO | Admitting: Physician Assistant

## 2017-12-22 ENCOUNTER — Ambulatory Visit (INDEPENDENT_AMBULATORY_CARE_PROVIDER_SITE_OTHER): Payer: Medicare HMO | Admitting: Physician Assistant

## 2017-12-23 ENCOUNTER — Ambulatory Visit (INDEPENDENT_AMBULATORY_CARE_PROVIDER_SITE_OTHER): Payer: Medicare HMO | Admitting: Physician Assistant

## 2017-12-30 ENCOUNTER — Ambulatory Visit (INDEPENDENT_AMBULATORY_CARE_PROVIDER_SITE_OTHER): Payer: Medicare HMO

## 2017-12-30 ENCOUNTER — Encounter (INDEPENDENT_AMBULATORY_CARE_PROVIDER_SITE_OTHER): Payer: Self-pay | Admitting: Orthopaedic Surgery

## 2017-12-30 ENCOUNTER — Ambulatory Visit (INDEPENDENT_AMBULATORY_CARE_PROVIDER_SITE_OTHER): Payer: Medicare HMO | Admitting: Orthopaedic Surgery

## 2017-12-30 DIAGNOSIS — M25512 Pain in left shoulder: Secondary | ICD-10-CM

## 2017-12-30 DIAGNOSIS — M25562 Pain in left knee: Secondary | ICD-10-CM

## 2017-12-30 MED ORDER — BUPIVACAINE HCL 0.25 % IJ SOLN
2.0000 mL | INTRAMUSCULAR | Status: AC | PRN
Start: 2017-12-30 — End: 2017-12-30
  Administered 2017-12-30: 2 mL via INTRA_ARTICULAR

## 2017-12-30 MED ORDER — BUPIVACAINE HCL 0.25 % IJ SOLN
2.0000 mL | INTRAMUSCULAR | Status: AC | PRN
  Administered 2017-12-30: 2 mL via INTRA_ARTICULAR

## 2017-12-30 MED ORDER — METHYLPREDNISOLONE ACETATE 40 MG/ML IJ SUSP
40.0000 mg | INTRAMUSCULAR | Status: AC | PRN
  Administered 2017-12-30: 40 mg via INTRA_ARTICULAR

## 2017-12-30 MED ORDER — LIDOCAINE HCL 2 % IJ SOLN
2.0000 mL | INTRAMUSCULAR | Status: AC | PRN
  Administered 2017-12-30: 2 mL

## 2017-12-30 MED ORDER — LIDOCAINE HCL 1 % IJ SOLN
2.0000 mL | INTRAMUSCULAR | Status: AC | PRN
  Administered 2017-12-30: 2 mL

## 2017-12-30 NOTE — Progress Notes (Signed)
Office Visit Note   Patient: Vanessa Beard           Date of Birth: Sep 25, 1950           MRN: [id number] Visit Date: 12/30/2017              Requested by: Seward Carol, MD [address] [place] [address] [place], [place] [address] PCP: Seward Carol, MD   Assessment & Plan: Visit Diagnoses:  1. Left knee pain, unspecified chronicity   2. Left shoulder pain, unspecified chronicity     Plan: Impression is left knee osteoarthritis.  #2 left shoulder rotator cuff tendinitis versus questionable attritional rotator cuff tear.  We will proceed with cortisone injection to both places today.  She is not any better in regards to the left shoulder, she will call us and let us know will obtain an MRI to assess her rotator cuff.  Follow-up with [place] as needed.  Follow-Up Instructions: Return if symptoms worsen or fail to improve.   Orders:  Orders Placed This Encounter  Procedures  . XR KNEE 3 VIEW LEFT  . XR Shoulder Left   No orders of the defined types were placed in this encounter.     Procedures: Large Joint Inj: L knee on 12/30/2017 5:14 PM Indications: pain Details: 22 G needle, anterolateral approach Medications: 2 mL lidocaine 1 %; 2 mL bupivacaine 0.25 %; 40 mg methylPREDNISolone acetate 40 MG/ML  Large Joint Inj: L subacromial bursa on 12/30/2017 5:14 PM Indications: pain Details: 22 G needle Medications: 2 mL lidocaine 2 %; 2 mL bupivacaine 0.25 %; 40 mg methylPREDNISolone acetate 40 MG/ML Outcome: tolerated well, no immediate complications Patient was prepped and draped in the usual sterile fashion.       Clinical Data: No additional findings.   Subjective: Chief Complaint  Patient presents with  . Left Knee - Pain    HPI patient is a pleasant 67 year old female who presents to our clinic today with left shoulder and left knee pain.  In regards to the left shoulder, her pain started approximately 3 months ago without any known injury or change in activity.   Pain is worse with forward flexion and internal rotation.  She also has pain sleeping on the affected side.  No previous injection to the left shoulder.  She does note a history of attritional rotator cuff tear to the right shoulder which was surgically repaired.  Doing well there.  In regards to the left knee, her pain started approximately 1 year ago without any known injury or change in activity, although this was around the time she was recovering from a right tibial plateau fracture.  Pain is retropatellar worse going up and down stairs.  She has tried Motrin without relief of symptoms.  No numbness, tingling or burning.  No previous cortisone injection to the left knee.  Review of Systems as detailed in HPI.  All others reviewed and are negative.   Objective: Vital Signs: There were no vitals taken for this visit.  Physical Exam well-developed well-nourished female no acute distress.  Alert and oriented x3.  Ortho Exam examination of the left shoulder reveals 50% active range of motion which is pain mediated.  Markedly positive empty can and cross body abduction.  She is neurovascular intact distally.  Examination of her left knee shows a trace effusion.  Range of motion 0 to 120 degrees.  Minimal joint line tenderness.  Moderate patellofemoral crepitus.  Collaterals and cruciate stable.  Specialty Comments:  No specialty comments available.  Imaging: Xr Knee 3 View Left  Result Date: 12/30/2017 Left knee x-rays show moderate tricompartmental degenerative changes.  Xr Shoulder Left  Result Date: 12/30/2017 X-rays of the left shoulder show mild glenohumeral changes and moderate AC degenerative joint changes    PMFS History: Patient Active Problem List   Diagnosis Date Noted  . Left knee pain 12/30/2017  . Left shoulder pain 12/30/2017  . Closed fracture of right tibial plateau 02/18/2017  . Benign hypertension 02/15/2017  . Screening examination for venereal disease 03/06/2015    . Encounter for long-term (current) use of medications 03/06/2015  . Elevated LFTs 12/11/2014  . Abnormal ultrasound 12/11/2014  . Abdominal pain, epigastric 12/11/2014  . ASCUS with positive high risk HPV 10/31/2013  . Gall bladder disease 02/03/2013  . 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  . Compression fracture of L3 lumbar vertebra 07/01/2011  . Thyroid dysfunction 07/01/2011  . Anemia 07/01/2011  . Sore throat 04/02/2011  . Hyperparathyroidism 02/10/2011  . Osteopenia 02/03/2011  . PAP SMEAR, LGSIL, ABNORMAL 12/21/2009  . COUGH 07/18/2009  . Chronic hepatitis C virus infection ([place]) 12/19/2008  . LEUKORRHEA 04/07/2008  . FATIGUE 04/07/2008  . BACK PAIN, LUMBAR 11/10/2007  . ALLERGIC RHINITIS 07/09/2007  . PNEUMOCYSTIS PNEUMONIA 05/07/2007  . HYPERTENSION 05/07/2007  . DENTAL CARIES 05/07/2007  . INSOMNIA 05/07/2007  . PNEUMONIA, HX OF 05/07/2007  . HERPES ZOSTER, HX OF 05/07/2007  . Human immunodeficiency virus (HIV) disease ([place]) 08/04/2006   Past Medical History:  Diagnosis Date  . Allergic rhinitis   . Biliary colic   . Gall bladder disease \  . Hepatitis C   . Herpes   . HIV infection ([place])   . Hyperparathyroidism ([place])   . Hypertension   . Insomnia   . Osteoporosis   . Pneumonia    2010    Family History  Problem Relation Age of Onset  . Diabetes Mother   . Hypertension Mother     Past Surgical History:  Procedure Laterality Date  . CHOLECYSTECTOMY N/A 01/09/2015   Procedure: LAPAROSCOPIC CHOLECYSTECTOMY;  Surgeon: [name], MD;  Location: [facility];  Service: General;  Laterality: N/A;  . ECTOPIC PREGNANCY SURGERY    . ORIF TIBIA PLATEAU Right 02/18/2017   Procedure: OPEN REDUCTION INTERNAL FIXATION (ORIF) RIGHT TIBIAL PLATEAU;  Surgeon: Leandrew Koyanagi, MD;  Location: [facility];  Service: Orthopedics;  Laterality: Right;  . SHOULDER SURGERY Right 12/2014   Social History   Occupational History  . Occupation: CNA  Tobacco  Use  . Smoking status: Never Smoker  . Smokeless tobacco: Never Used  Substance and Sexual Activity  . Alcohol use: Yes    Alcohol/week: 3.0 standard drinks    Types: 3 Standard drinks or equivalent per week    Comment: wine or [place] alcohol.  Occasional  . Drug use: No  . Sexual activity: Yes    Partners: Male    Birth control/protection: Condom    Comment: declined condoms

## 2017-12-31 ENCOUNTER — Other Ambulatory Visit (INDEPENDENT_AMBULATORY_CARE_PROVIDER_SITE_OTHER): Payer: Self-pay | Admitting: Orthopaedic Surgery

## 2018-01-21 ENCOUNTER — Other Ambulatory Visit: Payer: Self-pay | Admitting: Internal Medicine

## 2018-01-21 DIAGNOSIS — B20 Human immunodeficiency virus [HIV] disease: Secondary | ICD-10-CM

## 2018-02-16 ENCOUNTER — Ambulatory Visit: Payer: Medicare HMO | Admitting: Internal Medicine

## 2018-02-20 ENCOUNTER — Other Ambulatory Visit: Payer: Self-pay | Admitting: Internal Medicine

## 2018-02-20 DIAGNOSIS — B20 Human immunodeficiency virus [HIV] disease: Secondary | ICD-10-CM

## 2018-02-24 ENCOUNTER — Encounter: Payer: Self-pay | Admitting: Internal Medicine

## 2018-02-24 ENCOUNTER — Ambulatory Visit (INDEPENDENT_AMBULATORY_CARE_PROVIDER_SITE_OTHER): Payer: Medicare HMO | Admitting: Internal Medicine

## 2018-02-24 VITALS — BP 167/105 | HR 65 | Temp 98.2°F | Wt 165.4 lb

## 2018-02-24 DIAGNOSIS — B20 Human immunodeficiency virus [HIV] disease: Secondary | ICD-10-CM

## 2018-02-24 DIAGNOSIS — R69 Illness, unspecified: Secondary | ICD-10-CM | POA: Diagnosis not present

## 2018-02-24 DIAGNOSIS — I10 Essential (primary) hypertension: Secondary | ICD-10-CM | POA: Diagnosis not present

## 2018-02-24 DIAGNOSIS — Z23 Encounter for immunization: Secondary | ICD-10-CM

## 2018-02-24 NOTE — Progress Notes (Signed)
RFV: follow up for hiv disease  Patient ID: Vanessa Beard, female   DOB: 1950-10-07, 67 y.o.   MRN: [id number]  HPI Vanessa Beard is a 67 yo F, appears younger than stated age. Continues to do well in school. Has last class with math which she dreads. Anticipated graduation would be marc 2020  She is also concerned about weight gain  Outpatient Encounter Medications as of 02/24/2018  Medication Sig  . amLODipine (NORVASC) 10 MG tablet Take 1 tablet (10 mg total) by mouth daily.  Marland Kitchen aspirin EC 325 MG EC tablet Take 1 tablet (325 mg total) by mouth 2 (two) times daily.  . Chlorhexidine Gluconate Cloth 2 % PADS Apply 6 each topically daily at 6 (six) AM.  . ibuprofen (ADVIL,MOTRIN) 800 MG tablet TAKE 1 TABLET(800 MG) BY MOUTH EVERY 8 HOURS AS NEEDED  . Multiple Vitamins-Minerals (ADULT GUMMY) CHEW Chew 3 capsules by mouth daily.  . mupirocin ointment (BACTROBAN) 2 % Place 1 application 2 (two) times daily into the nose.  . naproxen (NAPROSYN) 500 MG tablet Take 1 tablet (500 mg total) by mouth 2 (two) times daily with a meal.  . polyethylene glycol (MIRALAX / GLYCOLAX) packet Take 17 g by mouth daily as needed for mild constipation.  . TRIUMEQ 600-50-300 MG tablet TAKE 1 TABLET BY MOUTH DAILY  . zolpidem (AMBIEN) 10 MG tablet Take 1 tablet (10 mg total) by mouth at bedtime as needed.  . [DISCONTINUED] HYDROcodone-acetaminophen (NORCO/VICODIN) 5-325 MG tablet Take 1 tablet by mouth daily as needed for severe pain.  . [DISCONTINUED] ibuprofen (ADVIL,MOTRIN) 800 MG tablet TAKE 1 TABLET BY MOUTH THREE TIMES DAILY AS NEEDED  . [DISCONTINUED] meloxicam (MOBIC) 15 MG tablet TAKE 1 TABLET BY MOUTH DAILY WITH BREAKFAST   No facility-administered encounter medications on file as of 02/24/2018.      Patient Active Problem List   Diagnosis Date Noted  . Left knee pain 12/30/2017  . Left shoulder pain 12/30/2017  . Closed fracture of right tibial plateau 02/18/2017  . Benign hypertension 02/15/2017    . Screening examination for venereal disease 03/06/2015  . Encounter for long-term (current) use of medications 03/06/2015  . Elevated LFTs 12/11/2014  . Abnormal ultrasound 12/11/2014  . Abdominal pain, epigastric 12/11/2014  . ASCUS with positive high risk HPV 10/31/2013  . Gall bladder disease 02/03/2013  . 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  . Compression fracture of L3 lumbar vertebra 07/01/2011  . Thyroid dysfunction 07/01/2011  . Anemia 07/01/2011  . Sore throat 04/02/2011  . Hyperparathyroidism 02/10/2011  . Osteopenia 02/03/2011  . PAP SMEAR, LGSIL, ABNORMAL 12/21/2009  . COUGH 07/18/2009  . Chronic hepatitis C virus infection ([place]) 12/19/2008  . LEUKORRHEA 04/07/2008  . FATIGUE 04/07/2008  . BACK PAIN, LUMBAR 11/10/2007  . ALLERGIC RHINITIS 07/09/2007  . PNEUMOCYSTIS PNEUMONIA 05/07/2007  . HYPERTENSION 05/07/2007  . DENTAL CARIES 05/07/2007  . INSOMNIA 05/07/2007  . PNEUMONIA, HX OF 05/07/2007  . HERPES ZOSTER, HX OF 05/07/2007  . Human immunodeficiency virus (HIV) disease ([place]) 08/04/2006     Health Maintenance Due  Topic Date Due  . PAP SMEAR  11/28/2017  . INFLUENZA VACCINE  12/17/2017    Social History   Tobacco Use  . Smoking status: Never Smoker  . Smokeless tobacco: Never Used  Substance Use Topics  . Alcohol use: Yes    Alcohol/week: 3.0 standard drinks    Types: 3 Standard drinks or equivalent per week    Comment: wine or [place] alcohol.  Occasional  . Drug use: No   Review of Systems 12 point ros is negative except for weight gain Physical Exam   BP (!) 167/105   Pulse 65   Temp 98.2 F (36.8 C)   Wt 165 lb 6.4 oz (75 kg)   BMI 32.30 kg/m   Physical Exam  Constitutional:  oriented to person, place, and time. appears well-developed and well-nourished. No distress.  HENT: [place]/AT, PERRLA, no scleral icterus Mouth/Throat: Oropharynx is clear and moist. No oropharyngeal exudate.  Cardiovascular: Normal rate,  regular rhythm and normal heart sounds. Exam reveals no gallop and no friction rub.  No murmur heard.  Pulmonary/Chest: Effort normal and breath sounds normal. No respiratory distress.  has no wheezes.  Neck = supple, no nuchal rigidity Abdominal: Soft. Bowel sounds are normal.  exhibits no distension. There is no tenderness.  Lymphadenopathy: no cervical adenopathy. No axillary adenopathy Neurological: alert and oriented to person, place, and time.  Skin: Skin is warm and dry. No rash noted. No erythema.  Psychiatric: a normal mood and affect.  behavior is normal.   Lab Results  Component Value Date   CD4TCELL 18 (L) 08/03/2017   Lab Results  Component Value Date   CD4TABS 260 (L) 08/03/2017   CD4TABS 160 (L) 05/04/2017   CD4TABS 190 (L) 01/12/2017   Lab Results  Component Value Date   HIV1RNAQUANT <20 08/03/2017   Lab Results  Component Value Date   HEPBSAB NEG 12/29/2013   Lab Results  Component Value Date   LABRPR NON REAC 01/12/2017    CBC Lab Results  Component Value Date   WBC 4.0 08/03/2017   RBC 4.10 08/03/2017   HGB 11.9 08/03/2017   HCT 35.2 08/03/2017   PLT 173 08/03/2017   MCV 85.9 08/03/2017   MCH 29.0 08/03/2017   MCHC 33.8 08/03/2017   RDW 13.9 08/03/2017   LYMPHSABS 1,112 08/03/2017   MONOABS 0.3 02/15/2017   EOSABS 112 08/03/2017    BMET Lab Results  Component Value Date   NA 139 08/03/2017   K 4.0 08/03/2017   CL 107 08/03/2017   CO2 27 08/03/2017   GLUCOSE 82 08/03/2017   BUN 22 08/03/2017   CREATININE 0.92 08/03/2017   CALCIUM 9.8 08/03/2017   GFRNONAA 64 08/03/2017   GFRAA 74 08/03/2017      Assessment and Plan  hiv disease= continue on current regimen. Will check Labs tosee if viral load is still undetectable.  Health miantenance =  flu vaccine  htn = not at goal, if elevated at next visit, will have her add to her regimen

## 2018-02-25 LAB — T-HELPER CELL (CD4) - (RCID CLINIC ONLY)
CD4 % Helper T Cell: 16 % — ABNORMAL LOW (ref 33–55)
CD4 T Cell Abs: 190 /uL — ABNORMAL LOW (ref 400–2700)

## 2018-02-26 LAB — CBC WITH DIFFERENTIAL/PLATELET
Basophils Absolute: 31 cells/uL (ref 0–200)
Basophils Relative: 0.7 %
Eosinophils Absolute: 79 cells/uL (ref 15–500)
Eosinophils Relative: 1.8 %
HCT: 37.1 % (ref 35.0–45.0)
Hemoglobin: 12.5 g/dL (ref 11.7–15.5)
Lymphs Abs: 1201 cells/uL (ref 850–3900)
MCH: 29.7 pg (ref 27.0–33.0)
MCHC: 33.7 g/dL (ref 32.0–36.0)
MCV: 88.1 fL (ref 80.0–100.0)
MPV: 12.1 fL (ref 7.5–12.5)
Monocytes Relative: 9.5 %
Neutro Abs: 2671 cells/uL (ref 1500–7800)
Neutrophils Relative %: 60.7 %
Platelets: 165 10*3/uL (ref 140–400)
RBC: 4.21 10*6/uL (ref 3.80–5.10)
RDW: 13.6 % (ref 11.0–15.0)
Total Lymphocyte: 27.3 %
WBC mixed population: 418 cells/uL (ref 200–950)
WBC: 4.4 10*3/uL (ref 3.8–10.8)

## 2018-02-26 LAB — COMPLETE METABOLIC PANEL WITH GFR
AG Ratio: 1 (calc) (ref 1.0–2.5)
ALT: 8 U/L (ref 6–29)
AST: 20 U/L (ref 10–35)
Albumin: 3.9 g/dL (ref 3.6–5.1)
Alkaline phosphatase (APISO): 81 U/L (ref 33–130)
BUN: 14 mg/dL (ref 7–25)
CO2: 29 mmol/L (ref 20–32)
Calcium: 9.5 mg/dL (ref 8.6–10.4)
Chloride: 105 mmol/L (ref 98–110)
Creat: 0.79 mg/dL (ref 0.50–0.99)
GFR, Est African American: 89 mL/min/{1.73_m2} (ref >=60)
GFR, Est Non African American: 77 mL/min/{1.73_m2} (ref >=60)
Globulin: 3.9 g/dL (calc) — ABNORMAL HIGH (ref 1.9–3.7)
Glucose, Bld: 81 mg/dL (ref 65–99)
Potassium: 3.9 mmol/L (ref 3.5–5.3)
Sodium: 139 mmol/L (ref 135–146)
Total Bilirubin: 0.7 mg/dL (ref 0.2–1.2)
Total Protein: 7.8 g/dL (ref 6.1–8.1)

## 2018-02-26 LAB — HIV-1 RNA QUANT-NO REFLEX-BLD
HIV 1 RNA Quant: <20 copies/mL
HIV-1 RNA Quant, Log: <1.3 Log copies/mL

## 2018-03-22 ENCOUNTER — Other Ambulatory Visit: Payer: Self-pay | Admitting: Internal Medicine

## 2018-03-22 DIAGNOSIS — B20 Human immunodeficiency virus [HIV] disease: Secondary | ICD-10-CM

## 2018-04-02 DIAGNOSIS — I10 Essential (primary) hypertension: Secondary | ICD-10-CM | POA: Diagnosis not present

## 2018-04-02 DIAGNOSIS — Z0100 Encounter for examination of eyes and vision without abnormal findings: Secondary | ICD-10-CM | POA: Diagnosis not present

## 2018-04-02 DIAGNOSIS — H5200 Hypermetropia, unspecified eye: Secondary | ICD-10-CM | POA: Diagnosis not present

## 2018-04-23 DIAGNOSIS — E669 Obesity, unspecified: Secondary | ICD-10-CM | POA: Diagnosis not present

## 2018-04-23 DIAGNOSIS — G4700 Insomnia, unspecified: Secondary | ICD-10-CM | POA: Diagnosis not present

## 2018-04-23 DIAGNOSIS — K08409 Partial loss of teeth, unspecified cause, unspecified class: Secondary | ICD-10-CM | POA: Diagnosis not present

## 2018-04-23 DIAGNOSIS — Z8249 Family history of ischemic heart disease and other diseases of the circulatory system: Secondary | ICD-10-CM | POA: Diagnosis not present

## 2018-04-23 DIAGNOSIS — R69 Illness, unspecified: Secondary | ICD-10-CM | POA: Diagnosis not present

## 2018-04-23 DIAGNOSIS — Z825 Family history of asthma and other chronic lower respiratory diseases: Secondary | ICD-10-CM | POA: Diagnosis not present

## 2018-04-23 DIAGNOSIS — Z833 Family history of diabetes mellitus: Secondary | ICD-10-CM | POA: Diagnosis not present

## 2018-04-23 DIAGNOSIS — I10 Essential (primary) hypertension: Secondary | ICD-10-CM | POA: Diagnosis not present

## 2018-05-04 ENCOUNTER — Emergency Department (HOSPITAL_COMMUNITY): Payer: Medicare HMO

## 2018-05-04 ENCOUNTER — Encounter (HOSPITAL_COMMUNITY): Payer: Self-pay

## 2018-05-04 ENCOUNTER — Emergency Department (HOSPITAL_COMMUNITY)
Admission: EM | Admit: 2018-05-04 | Discharge: 2018-05-04 | Disposition: A | Discharge status: Home / Self Care | Payer: Medicare HMO | Attending: Emergency Medicine | Admitting: Emergency Medicine

## 2018-05-04 ENCOUNTER — Other Ambulatory Visit: Payer: Self-pay

## 2018-05-04 DIAGNOSIS — M25562 Pain in left knee: Secondary | ICD-10-CM | POA: Insufficient documentation

## 2018-05-04 DIAGNOSIS — S299XXA Unspecified injury of thorax, initial encounter: Secondary | ICD-10-CM | POA: Diagnosis not present

## 2018-05-04 DIAGNOSIS — S199XXA Unspecified injury of neck, initial encounter: Secondary | ICD-10-CM | POA: Diagnosis not present

## 2018-05-04 DIAGNOSIS — R69 Illness, unspecified: Secondary | ICD-10-CM | POA: Diagnosis not present

## 2018-05-04 DIAGNOSIS — Z7982 Long term (current) use of aspirin: Secondary | ICD-10-CM | POA: Insufficient documentation

## 2018-05-04 DIAGNOSIS — I10 Essential (primary) hypertension: Secondary | ICD-10-CM | POA: Insufficient documentation

## 2018-05-04 DIAGNOSIS — B20 Human immunodeficiency virus [HIV] disease: Secondary | ICD-10-CM | POA: Diagnosis not present

## 2018-05-04 DIAGNOSIS — M25512 Pain in left shoulder: Secondary | ICD-10-CM | POA: Diagnosis not present

## 2018-05-04 DIAGNOSIS — S0990XA Unspecified injury of head, initial encounter: Secondary | ICD-10-CM | POA: Diagnosis not present

## 2018-05-04 DIAGNOSIS — S8992XA Unspecified injury of left lower leg, initial encounter: Secondary | ICD-10-CM | POA: Diagnosis not present

## 2018-05-04 DIAGNOSIS — Z79899 Other long term (current) drug therapy: Secondary | ICD-10-CM | POA: Diagnosis not present

## 2018-05-04 DIAGNOSIS — S4992XA Unspecified injury of left shoulder and upper arm, initial encounter: Secondary | ICD-10-CM | POA: Diagnosis not present

## 2018-05-04 DIAGNOSIS — R079 Chest pain, unspecified: Secondary | ICD-10-CM | POA: Diagnosis not present

## 2018-05-04 DIAGNOSIS — R52 Pain, unspecified: Secondary | ICD-10-CM | POA: Diagnosis not present

## 2018-05-04 MED ORDER — IBUPROFEN 400 MG PO TABS
600.0000 mg | ORAL_TABLET | Freq: Once | ORAL | Status: AC
  Administered 2018-05-04: 600 mg via ORAL
  Filled 2018-05-04: qty 1

## 2018-05-04 MED ORDER — HYDROCODONE-ACETAMINOPHEN 5-325 MG PO TABS
1.0000 | ORAL_TABLET | Freq: Once | ORAL | Status: AC
  Administered 2018-05-04: 1 via ORAL
  Filled 2018-05-04: qty 1
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
## 2018-05-04 NOTE — Discharge Instructions (Addendum)
You have been seen today for a knee injury.  Antiinflammatory medications: Take 600 mg of ibuprofen every 6 hours for the next 3 days. After this time, this medication may be used as needed for pain. Take this medication with food to avoid upset stomach. Acetaminophen (generic for Tylenol): Should you continue to have additional pain while taking the ibuprofen, you may add in acetaminophen as needed. Your daily total maximum amount of acetaminophen from all sources should be limited to 4000mg /day for persons without liver problems, or 2000mg /day for those with liver problems. Ice: May apply ice to the area over the next 24 hours for 15 minutes at a time to reduce swelling. Elevation: Keep the extremity elevated as often as possible to reduce pain and inflammation. Support: Wear the knee immobilizer for support and comfort. Wear this until pain resolves. You will be weight-bearing as tolerated, which means you can slowly start to put weight on the extremity and increase amount and frequency as pain allows. Follow up: You should follow up with the orthopedic specialist within two weeks. Return: Return to the ED for numbness, weakness, increasing pain, overall worsening symptoms, loss of function, or if symptoms are not improving, you have tried to follow up with the orthopedic specialist, and have been unable to do so.  For prescription assistance, may try using prescription discount sites or apps, such as goodrx.com

## 2018-05-07 ENCOUNTER — Encounter (INDEPENDENT_AMBULATORY_CARE_PROVIDER_SITE_OTHER): Payer: Self-pay | Admitting: Family Medicine

## 2018-05-07 ENCOUNTER — Ambulatory Visit (INDEPENDENT_AMBULATORY_CARE_PROVIDER_SITE_OTHER): Payer: Medicare HMO | Admitting: Family Medicine

## 2018-05-07 DIAGNOSIS — M25561 Pain in right knee: Secondary | ICD-10-CM | POA: Diagnosis not present

## 2018-05-07 DIAGNOSIS — M25562 Pain in left knee: Secondary | ICD-10-CM | POA: Diagnosis not present

## 2018-05-07 DIAGNOSIS — M25512 Pain in left shoulder: Secondary | ICD-10-CM | POA: Diagnosis not present

## 2018-05-07 MED ORDER — TIZANIDINE HCL 2 MG PO TABS: 2.0000 mg | ORAL_TABLET | Freq: Four times a day (QID) | ORAL | 1 refills | Status: DC | PRN

## 2018-05-07 MED ORDER — HYDROCODONE-ACETAMINOPHEN 5-325 MG PO TABS: 1.0000 | ORAL_TABLET | Freq: Every evening | ORAL | 0 refills | Status: DC | PRN

## 2018-05-07 MED ORDER — PREDNISONE 10 MG PO TABS: ORAL_TABLET | ORAL | 0 refills | Status: DC
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
## 2018-05-10 ENCOUNTER — Ambulatory Visit: Payer: Medicare HMO | Admitting: Physical Therapy

## 2018-05-14 ENCOUNTER — Telehealth (INDEPENDENT_AMBULATORY_CARE_PROVIDER_SITE_OTHER): Payer: Self-pay | Admitting: Family Medicine

## 2018-05-14 NOTE — Telephone Encounter (Signed)
I spoke with the patient: what is hurting her most right now is her lower back with pain down to her left knee. She says the Hydrocodone is not helping her pain, it just puts her to sleep. She is taking the Prednisone as instructed. Please advise.

## 2018-05-14 NOTE — Telephone Encounter (Signed)
Left a message on the patient's voice mail to call back so I may get a little more information from her about the pain she is experiencing.

## 2018-05-14 NOTE — Telephone Encounter (Signed)
Patient called, is still in a lot of pain,states hydrocodone is not helping ..pts callback is [place]

## 2018-05-14 NOTE — Telephone Encounter (Signed)
She was not having back pain when we talked.  She will need to see someone for this.  Can go to urgent care over weekend, or come in some time next week if we have openings.

## 2018-05-14 NOTE — Telephone Encounter (Signed)
I advised the patient of this.  She wanted to schedule an appointment with Dr. Junius Roads for evaluation of the back - 05/20/18 at 42. If symptoms worsen over the weekend (increased pain, numbness in that leg) she should be seen somewhere this weekend.  She voiced understanding.

## 2018-05-18 DIAGNOSIS — D472 Monoclonal gammopathy: Secondary | ICD-10-CM | POA: Diagnosis not present

## 2018-05-18 DIAGNOSIS — E559 Vitamin D deficiency, unspecified: Secondary | ICD-10-CM | POA: Diagnosis not present

## 2018-05-18 DIAGNOSIS — M810 Age-related osteoporosis without current pathological fracture: Secondary | ICD-10-CM | POA: Diagnosis not present

## 2018-05-18 DIAGNOSIS — I10 Essential (primary) hypertension: Secondary | ICD-10-CM | POA: Diagnosis not present

## 2018-05-18 DIAGNOSIS — Z0000 Encounter for general adult medical examination without abnormal findings: Secondary | ICD-10-CM | POA: Diagnosis not present

## 2018-05-18 DIAGNOSIS — Z1389 Encounter for screening for other disorder: Secondary | ICD-10-CM | POA: Diagnosis not present

## 2018-05-18 DIAGNOSIS — R69 Illness, unspecified: Secondary | ICD-10-CM | POA: Diagnosis not present

## 2018-05-20 ENCOUNTER — Encounter (INDEPENDENT_AMBULATORY_CARE_PROVIDER_SITE_OTHER): Payer: Self-pay | Admitting: Family Medicine

## 2018-05-20 ENCOUNTER — Ambulatory Visit (INDEPENDENT_AMBULATORY_CARE_PROVIDER_SITE_OTHER): Payer: Medicare HMO

## 2018-05-20 ENCOUNTER — Ambulatory Visit (INDEPENDENT_AMBULATORY_CARE_PROVIDER_SITE_OTHER): Payer: Medicare HMO | Admitting: Family Medicine

## 2018-05-20 DIAGNOSIS — M5442 Lumbago with sciatica, left side: Secondary | ICD-10-CM | POA: Diagnosis not present

## 2018-05-20 MED ORDER — ETODOLAC 400 MG PO TABS: 400.0000 mg | ORAL_TABLET | Freq: Two times a day (BID) | ORAL | 3 refills | Status: DC | PRN

## 2018-05-20 MED ORDER — TRAMADOL HCL 50 MG PO TABS: 50.0000 mg | ORAL_TABLET | Freq: Four times a day (QID) | ORAL | 0 refills | Status: DC | PRN

## 2018-05-20 MED ORDER — BACLOFEN 10 MG PO TABS: 10.0000 mg | ORAL_TABLET | Freq: Three times a day (TID) | ORAL | 3 refills | Status: DC | PRN
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
## 2018-05-22 ENCOUNTER — Other Ambulatory Visit: Payer: Self-pay | Admitting: Internal Medicine

## 2018-05-22 DIAGNOSIS — B20 Human immunodeficiency virus [HIV] disease: Secondary | ICD-10-CM

## 2018-05-25 ENCOUNTER — Ambulatory Visit: Payer: Medicare HMO

## 2018-05-25 ENCOUNTER — Ambulatory Visit: Payer: Medicare HMO | Attending: Family Medicine | Admitting: Physical Therapy

## 2018-05-25 ENCOUNTER — Other Ambulatory Visit: Payer: Self-pay

## 2018-05-25 DIAGNOSIS — M545 Low back pain: Secondary | ICD-10-CM | POA: Diagnosis not present

## 2018-05-25 DIAGNOSIS — G8929 Other chronic pain: Secondary | ICD-10-CM | POA: Insufficient documentation

## 2018-05-25 DIAGNOSIS — M25512 Pain in left shoulder: Secondary | ICD-10-CM | POA: Insufficient documentation

## 2018-05-25 DIAGNOSIS — M25562 Pain in left knee: Secondary | ICD-10-CM | POA: Diagnosis not present

## 2018-05-25 DIAGNOSIS — M25561 Pain in right knee: Secondary | ICD-10-CM | POA: Insufficient documentation

## 2018-05-26 ENCOUNTER — Encounter: Payer: Self-pay | Admitting: Physical Therapy
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
## 2018-05-27 ENCOUNTER — Ambulatory Visit: Payer: Medicare HMO | Admitting: Physical Therapy

## 2018-05-27 ENCOUNTER — Encounter: Payer: Self-pay | Admitting: Physical Therapy

## 2018-05-27 DIAGNOSIS — M545 Low back pain: Secondary | ICD-10-CM | POA: Diagnosis not present

## 2018-05-27 DIAGNOSIS — M25512 Pain in left shoulder: Secondary | ICD-10-CM

## 2018-05-27 DIAGNOSIS — G8929 Other chronic pain: Secondary | ICD-10-CM

## 2018-05-27 DIAGNOSIS — M25561 Pain in right knee: Secondary | ICD-10-CM

## 2018-05-27 DIAGNOSIS — M25562 Pain in left knee: Secondary | ICD-10-CM | POA: Diagnosis not present

## 2018-05-27 NOTE — Patient Instructions (Addendum)
SHOULDER: External Rotation - Supine (Cane)   Hold cane with both hands. Rotate arm away from body. Keep elbow on floor and next to body. _10__ reps per set, 2_ sets per day, __7_ days per week Add towel to keep elbow at side.  Cane Exercise: Flexion   Lie on back, holding cane above chest. Keeping arms as straight as possible, lower cane toward floor beyond head. Hold _3___ seconds. Repeat _20___ times. Do __2__ sessions per day.  External Rotation: Hip - Knees Apart (Hook-Lying)    Lie with hips and knees bent, band tied just above knees. Pull knees apart. Hold for __5_ seconds. Rest for _1__ seconds. Repeat _20__ times. Do _2__ times a day.   Short Arc [employer] a large can or rolled towel under leg. Straighten knee and leg. Hold __5__ seconds. Repeat with other leg. Repeat __20__ times. Do _2__ sessions per day.  ALSO SQUEEZE PILLOW BETWEEN KNEES X 20 , 2 times per day
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
## 2018-05-28 ENCOUNTER — Ambulatory Visit (INDEPENDENT_AMBULATORY_CARE_PROVIDER_SITE_OTHER): Payer: Medicare HMO | Admitting: Family Medicine

## 2018-05-28 ENCOUNTER — Telehealth: Payer: Self-pay

## 2018-05-28 NOTE — Telephone Encounter (Signed)
Patient called regarding pharmacy requiring $900 to pick up her medication. Spoke with Cassie with [facility] pharmacy for possible co-pay card. Cassie advised that a co-pay card with not work for patient and she will need to speak with Development worker, community. Nurse set up appointment with Juliann Pulse on 06/02/18 at 3pm. Patient is currently out of her Triumeq.

## 2018-05-28 NOTE — Telephone Encounter (Signed)
Can you see is cassie can bridge her with any extra triumeq we may have?

## 2018-05-31 ENCOUNTER — Ambulatory Visit (INDEPENDENT_AMBULATORY_CARE_PROVIDER_SITE_OTHER): Payer: Medicare HMO | Admitting: Family Medicine

## 2018-06-01 ENCOUNTER — Telehealth: Payer: Self-pay

## 2018-06-01 NOTE — Telephone Encounter (Signed)
Received fax from [facility] stating Appeal for Triumeq was approved in error. Patient will have to pay $900 a month for medication. Prescribing MD will need to either send a level 2 appeal (request in witting) or change prescription. Will route message to Dr. Baxter Flattery to [name] next steps. Letter must be faxed to [place]. Loraine Grip Fax: [phone] Phone: [phone] Vanessa Beard Vanessa Beard, [place]

## 2018-06-01 NOTE — Telephone Encounter (Signed)
-----   Message from Darletta Moll, [place] sent at 06/01/2018  2:09 PM EST ----- Regarding: RE: Medication brigde assistance Sorry just saw this - no, unfortunately I don't have any extra Triumeq. She can get PAF assistance too when she comes to bridge her before her SPAP becomes active. She can see Inez Catalina for that. Thanks! ----- Message ----- From: Eugenia Mcalpine, LPN Sent: 6/38/6854   4:13 PM EST To: Darletta Moll, RPH-CPP Subject: Medication brigde assistance                   This patient we spoke briefly about needing SPAP for triumeq. I have schedule her an appointment with Juliann Pulse.  Dr. Baxter Flattery asks can you bridge her with any extra triumeq we may have?

## 2018-06-01 NOTE — Telephone Encounter (Signed)
Called patient to remind her of her upcoming appointment on 06/02/18. Also mentioned to patient that she will also need to see Inez Catalina for PAF assistance.  S.StaleyLPN

## 2018-06-02 ENCOUNTER — Encounter (INDEPENDENT_AMBULATORY_CARE_PROVIDER_SITE_OTHER): Payer: Self-pay | Admitting: Family Medicine

## 2018-06-02 ENCOUNTER — Ambulatory Visit: Payer: Medicare HMO

## 2018-06-02 ENCOUNTER — Ambulatory Visit (INDEPENDENT_AMBULATORY_CARE_PROVIDER_SITE_OTHER): Payer: Medicare HMO | Admitting: Family Medicine

## 2018-06-02 DIAGNOSIS — M25562 Pain in left knee: Secondary | ICD-10-CM

## 2018-06-02 DIAGNOSIS — M5442 Lumbago with sciatica, left side: Secondary | ICD-10-CM

## 2018-06-02 DIAGNOSIS — M25561 Pain in right knee: Secondary | ICD-10-CM | POA: Diagnosis not present
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
## 2018-06-08 ENCOUNTER — Encounter: Payer: Medicare HMO | Admitting: Physical Therapy

## 2018-06-08 ENCOUNTER — Encounter: Payer: Self-pay | Admitting: Internal Medicine

## 2018-06-08 ENCOUNTER — Ambulatory Visit: Payer: Medicare HMO | Admitting: Physical Therapy

## 2018-06-08 DIAGNOSIS — M545 Low back pain: Principal | ICD-10-CM

## 2018-06-08 DIAGNOSIS — M25561 Pain in right knee: Secondary | ICD-10-CM

## 2018-06-08 DIAGNOSIS — M25512 Pain in left shoulder: Secondary | ICD-10-CM | POA: Diagnosis not present

## 2018-06-08 DIAGNOSIS — M25562 Pain in left knee: Secondary | ICD-10-CM

## 2018-06-08 DIAGNOSIS — G8929 Other chronic pain: Secondary | ICD-10-CM | POA: Diagnosis not present

## 2018-06-08 NOTE — Patient Instructions (Signed)

## 2018-06-08 NOTE — Therapy (Signed)
[place], [place], [address] Phone: [phone]   Fax:  [phone]  Physical Therapy Treatment  Patient Details  Name: Vanessa Beard MRN: [id number] Date of Birth: November 07, 1950 Referring Provider (PT): Dr Eunice Blase    Encounter Date: 06/08/2018  PT End of Session - 06/08/18 1558    Visit Number  3    Number of Visits  16    Date for PT Re-Evaluation  07/21/18    Authorization Type  [employer] MCR 10 visit progress note. 40 dollar co-pay but all charges being covered by her insurance     PT Start Time  1506    PT Stop Time  1603    PT Time Calculation (min)  57 min    Activity Tolerance  Patient tolerated treatment well    Behavior During Therapy  WFL for tasks assessed/performed       Past Medical History:  Diagnosis Date  . Allergic rhinitis   . Biliary colic   . Gall bladder disease \  . Hepatitis C   . Herpes   . HIV infection ([place])   . Hyperparathyroidism ([place])   . Hypertension   . Insomnia   . Osteoporosis   . Pneumonia    2010    Past Surgical History:  Procedure Laterality Date  . CHOLECYSTECTOMY N/A 01/09/2015   Procedure: LAPAROSCOPIC CHOLECYSTECTOMY;  Surgeon: [name], MD;  Location: [facility];  Service: General;  Laterality: N/A;  . ECTOPIC PREGNANCY SURGERY    . ORIF TIBIA PLATEAU Right 02/18/2017   Procedure: OPEN REDUCTION INTERNAL FIXATION (ORIF) RIGHT TIBIAL PLATEAU;  Surgeon: Leandrew Koyanagi, MD;  Location: [facility];  Service: Orthopedics;  Laterality: Right;  . SHOULDER SURGERY Right 12/2014    There were no vitals filed for this visit.                    [facility] Adult PT Treatment/Exercise - 06/08/18 0001      Self-Care   Self-Care  Other Self-Care Comments    Other Self-Care Comments   where to buty tens unti       Lumbar Exercises: Stretches   Active Hamstring Stretch Limitations  setad 3x20 sec hold with cuing not to flex into pain     Lower  Trunk Rotation  5 reps    Other Lumbar Stretch Exercise  with towel gentle single knee to chest stretch 3x15 sec hold       Modalities   Modalities  Electrical Stimulation      Electrical Stimulation   Electrical Stimulation Location  Lumbar    Electrical Stimulation Action  IFC x15 min     Electrical Stimulation Parameters  to tolerance     Electrical Stimulation Goals  Pain      Manual Therapy   Manual Therapy  Joint mobilization;Soft tissue mobilization;Manual Traction    Joint Mobilization  posterior mobilization of the left hip to improve flexion     Soft tissue mobilization  trigger point release to bilateral thoracic and lumbar spine; tiger tail to left gluteal     Manual Traction  of left hip. Significant improvement in pain noted                PT Short Term Goals - 06/08/18 1609      PT SHORT TERM GOAL #1   Title  Patient will increase passive shoulder ROM t0 145    Time  4  Period  Weeks    Status  On-going      PT SHORT TERM GOAL #2   Title  Patient will improve lumbar flexion motion by 50%     Time  4    Period  Weeks    Status  On-going      PT SHORT TERM GOAL #3   Title  Patient will report <2/10 at worst     Time  4    Period  Weeks    Status  On-going        PT Long Term Goals - 05/26/18 0849      PT LONG TERM GOAL #1   Title  Patient will demostrate functional right shoulder flexion in order to reach into an overhead cabinet with her left arm     Time  8    Period  Weeks    Status  New    Target Date  07/21/18      PT LONG TERM GOAL #2   Title  Patient will bend to pick item off the ground without pain in order to return to her job    Time  8    Period  Weeks    Status  New    Target Date  07/21/18      PT LONG TERM GOAL #3   Title  Patient will reach behind her back without pain in order to perfrom hygeine tasks     Time  8    Period  Weeks    Status  New    Target Date  07/21/18            Plan - 06/08/18 1559     Clinical Impression Statement  Therapy focused on manual therapy today. She has spasming form her thoracic spine into her lumbar spine and hip .S he also has tightness down inot her IT band all the way to her knee. Therapy oberved her gait ands noted she is walkingwith a flexed posture and with significant right hip abduction. She was advised when she is home to concentrate on standing tall and walking without hip flexion.    Clinical Presentation  Evolving    Clinical Decision Making  High    Rehab Potential  Good    PT Frequency  2x / week    PT Duration  8 weeks    PT Treatment/Interventions  ADLs/Self Care Home Management;Cryotherapy;Electrical Stimulation;Iontophoresis 4mg /ml Dexamethasone;Moist Heat;Traction;Ultrasound;DME Instruction;Gait training;Stair training;Functional mobility training;Therapeutic activities;Therapeutic exercise;Neuromuscular re-education;Patient/family education;Manual techniques;Taping;Passive range of motion;Dry needling    PT Next Visit Plan  continue with psoterior capsule hip stretching, LAD; soft tissue mobilzation to left hip; consder PPT; consder seated hip abdcution; consider exrecise ball roll     PT Home Exercise Plan  hamstring stretch; seated posture; supine single knee to chest if able; tennis ball trigger point release , SAQ, supine clam yellow band, ball squeeze , supine shoulder cane.     Consulted and Agree with Plan of Care  Patient       Patient will benefit from skilled therapeutic intervention in order to improve the following deficits and impairments:  Abnormal gait, Pain, Obesity, Increased fascial restricitons, Postural dysfunction, Impaired UE functional use, Decreased activity tolerance, Decreased endurance, Decreased range of motion, Decreased strength  Visit Diagnosis: Chronic bilateral low back pain without sciatica  Chronic pain of right knee  Chronic pain of left knee  Chronic left shoulder pain     Problem List Patient Active  Problem List  Diagnosis Date Noted  . Left knee pain 12/30/2017  . Left shoulder pain 12/30/2017  . Closed fracture of right tibial plateau 02/18/2017  . Benign hypertension 02/15/2017  . Screening examination for venereal disease 03/06/2015  . Encounter for long-term (current) use of medications 03/06/2015  . Elevated LFTs 12/11/2014  . Abnormal ultrasound 12/11/2014  . Abdominal pain, epigastric 12/11/2014  . ASCUS with positive high risk HPV 10/31/2013  . Gall bladder disease 02/03/2013  . 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  . Compression fracture of L3 lumbar vertebra 07/01/2011  . Thyroid dysfunction 07/01/2011  . Anemia 07/01/2011  . Sore throat 04/02/2011  . Hyperparathyroidism 02/10/2011  . Osteopenia 02/03/2011  . PAP SMEAR, LGSIL, ABNORMAL 12/21/2009  . COUGH 07/18/2009  . Chronic hepatitis C virus infection ([place]) 12/19/2008  . LEUKORRHEA 04/07/2008  . FATIGUE 04/07/2008  . BACK PAIN, LUMBAR 11/10/2007  . ALLERGIC RHINITIS 07/09/2007  . PNEUMOCYSTIS PNEUMONIA 05/07/2007  . HYPERTENSION 05/07/2007  . DENTAL CARIES 05/07/2007  . INSOMNIA 05/07/2007  . PNEUMONIA, HX OF 05/07/2007  . HERPES ZOSTER, HX OF 05/07/2007  . Human immunodeficiency virus (HIV) disease ([place]) 08/04/2006    Carney Living PT DPT 06/08/2018, 4:16 PM  [facility] [address] [place], [place], [address] Phone: [phone]   Fax:  [phone]  Name: Vanessa Beard MRN: [id number] Date of Birth: 08-Oct-1950

## 2018-06-09 ENCOUNTER — Telehealth: Payer: Self-pay

## 2018-06-09 NOTE — Telephone Encounter (Signed)
Patient called office today requesting to speak with Juliann Pulse, Financial counselor regarding a letter she got from [facility]. Patient states that she is now eligible for additional benefits and would like to speak to Rodri­guez Hevia about this. Call was transferred to [facility].  Patient also states that she needs a refill on her Triumeq. Advised patient to call pharmacy since they should have active prescription.  Patient will call after leaving message with Financial counselor. [place]

## 2018-06-10 ENCOUNTER — Encounter: Payer: Self-pay | Admitting: Physical Therapy

## 2018-06-10 ENCOUNTER — Ambulatory Visit: Payer: Medicare HMO | Admitting: Physical Therapy

## 2018-06-10 DIAGNOSIS — M25561 Pain in right knee: Secondary | ICD-10-CM

## 2018-06-10 DIAGNOSIS — G8929 Other chronic pain: Secondary | ICD-10-CM

## 2018-06-10 DIAGNOSIS — M545 Low back pain: Principal | ICD-10-CM

## 2018-06-10 DIAGNOSIS — M25562 Pain in left knee: Secondary | ICD-10-CM | POA: Diagnosis not present

## 2018-06-10 DIAGNOSIS — M25512 Pain in left shoulder: Secondary | ICD-10-CM

## 2018-06-11 ENCOUNTER — Encounter: Payer: Self-pay | Admitting: Physical Therapy

## 2018-06-11 NOTE — Therapy (Signed)
[place], [place], [address] Phone: [phone]   Fax:  [phone]  Physical Therapy Treatment  Patient Details  Name: Vanessa Beard MRN: [id number] Date of Birth: August 15, 1950 Referring Provider (PT): Dr Eunice Blase    Encounter Date: 06/10/2018  PT End of Session - 06/11/18 0912    Visit Number  4    Number of Visits  16    Date for PT Re-Evaluation  07/21/18    Authorization Type  [employer] M[employer] 10 visit progress note. 40 dollar co-pay but all charges being covered by her insurance     PT Start Time  1415    PT Stop Time  1511    PT Time Calculation (min)  56 min    Activity Tolerance  Patient tolerated treatment well    Behavior During Therapy  WFL for tasks assessed/performed       Past Medical History:  Diagnosis Date  . Allergic rhinitis   . Biliary colic   . Gall bladder disease \  . Hepatitis C   . Herpes   . HIV infection ([place])   . Hyperparathyroidism ([place])   . Hypertension   . Insomnia   . Osteoporosis   . Pneumonia    2010    Past Surgical History:  Procedure Laterality Date  . CHOLECYSTECTOMY N/A 01/09/2015   Procedure: LAPAROSCOPIC CHOLECYSTECTOMY;  Surgeon: [name], MD;  Location: [place];  Service: General;  Laterality: N/A;  . ECTOPIC PREGNANCY SURGERY    . ORIF TIBIA PLATEAU Right 02/18/2017   Procedure: OPEN REDUCTION INTERNAL FIXATION (ORIF) RIGHT TIBIAL PLATEAU;  Surgeon: Leandrew Koyanagi, MD;  Location: [place];  Service: Orthopedics;  Laterality: Right;  . SHOULDER SURGERY Right 12/2014    There were no vitals filed for this visit.  Subjective Assessment - 06/10/18 1426    Subjective  Patient rpeorts her back has been sore over the past few days. She has taken her ibuporfin.     How long can you sit comfortably?  stiffens after sitting for too long     How long can you stand comfortably?  < 45 min     How long can you walk comfortably?  limited community  distances     Diagnostic tests  X-ray R knee: old healed tibial platue fx/ left knee: moderate tri-comp arthritis; Lumbar spine old L4 copression fx     Patient Stated Goals  Less pain overall     Currently in Pain?  Yes    Pain Score  5     Pain Location  Hip    Pain Orientation  Left;Mid    Pain Descriptors / Indicators  Aching    Pain Type  Acute pain    Pain Onset  More than a month ago    Pain Frequency  Constant    Aggravating Factors   bending and standing     Pain Relieving Factors  rest     Effect of Pain on Daily Activities  difficulty standing to perfrom ADL's     Pain Score  --   knees arent hurting today         [facility] PT Assessment - 06/11/18 0001      Assessment   Medical Diagnosis  Low Back Pain     Referring Provider (PT)  Dr Legrand Como Hilts     Onset Date/Surgical Date  05/04/18    Hand Dominance  Right    Next MD Visit  05/28/2017     Prior Therapy  None      Precautions   Precautions  None      Restrictions   Weight Bearing Restrictions  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       Cognition   Overall Cognitive Status  Within Functional Limits for tasks assessed    Attention  Focused    Focused Attention  Appears intact    Memory  Appears intact    Awareness  Appears intact    Problem Solving  Appears intact      Observation/Other Assessments   Focus on Therapeutic Outcomes (FOTO)   69% limitation on FOTO       Sensation   Light Touch  Appears Intact    Additional Comments  can have some poain that traidaties into her shoulder       Coordination   Gross Motor Movements are Fluid and Coordinated  Yes    Fine Motor Movements are Fluid and Coordinated  Yes      AROM   Lumbar Flexion  limited 75%. Has difficulty standing back up.     Lumbar Extension  limited 50%     Lumbar - Right Side Bend  limited 50% with pain     Lumbar - Left Side Bend  limited 50%  with pain     Lumbar - Right Rotation  limited 50% with pain     Lumbar - Left Rotation  limited 50 % with pain       PROM   Overall PROM Comments  right hip flexion WNL Right shoulder motion WNL     Left Shoulder Flexion  110 Degrees    Left Shoulder External Rotation  40 Degrees    Left Shoulder Horizontal ABduction  90 Degrees    Left Hip Flexion  --   67 degrees    Left Hip Internal Rotation   --   Painful     Strength   Overall Strength Comments  unbale totest shoulder 2nd to significant pain. shoulder < 3/5 can not lift against gravity     Right Hip Flexion  4+/5    Right Hip ABduction  4+/5    Right Hip ADduction  4+/5    Left Hip Flexion  4/5    Left Hip ABduction  4/5    Right Knee Flexion  5/5    Right Knee Extension  5/5    Left Knee Flexion  5/5    Left Knee Extension  5/5      Palpation   Palpation comment  significant tendenress to palpation in bilateral lumbar lower parapsinals; trigger point in the left upper trap and into the left parapscapular area.       Special Tests   Other special tests  special testing of the shoulder not perfromed this visit 2nd to time limitations and what she reported was her biggest problem which was her back. SLR : (+) L      Ambulation/Gait   Gait Comments  decreased single leg stance time on the lkeft; walks with flexed trunk; decreased hip flexion bilateral; significant lateral trunk motion.                    [facility] Adult PT Treatment/Exercise - 06/11/18 0001      Lumbar Exercises: Stretches   Active Hamstring Stretch  Limitations  setad 3x20 sec hold with cuing not to flex into pain     Lower Trunk Rotation  5 reps    Other Lumbar Stretch Exercise  with towel gentle single knee to chest stretch 3x15 sec hold       Lumbar Exercises: Seated   Other Seated Lumbar Exercises  ball squeeze with abdominal squeeze 2x10; hip aduction 2x10 yellow band       Lumbar Exercises: Supine   Other Supine Lumbar Exercises  supine  march x10       Electrical Stimulation   Electrical Stimulation Location  Lumbar    Electrical Stimulation Goals  Pain      Manual Therapy   Manual Therapy  Joint mobilization;Soft tissue mobilization;Manual Traction    Joint Mobilization  posterior mobilization of the left hip to improve flexion     Soft tissue mobilization  trigger point release to bilateral thoracic and lumbar spine; tiger tail to left gluteal     Manual Traction  of left hip. Significant improvement in pain noted              PT Education - 06/11/18 0910    Education Details  reviewed exercises and stretches     Person(s) Educated  Patient    Methods  Explanation;Demonstration;Tactile cues;Verbal cues    Comprehension  Verbalized understanding;Returned demonstration;Verbal cues required;Tactile cues required       PT Short Term Goals - 06/08/18 1609      PT SHORT TERM GOAL #1   Title  Patient will increase passive shoulder ROM t0 145    Time  4    Period  Weeks    Status  On-going      PT SHORT TERM GOAL #2   Title  Patient will improve lumbar flexion motion by 50%     Time  4    Period  Weeks    Status  On-going      PT SHORT TERM GOAL #3   Title  Patient will report <2/10 at worst     Time  4    Period  Weeks    Status  On-going        PT Long Term Goals - 05/26/18 0849      PT LONG TERM GOAL #1   Title  Patient will demostrate functional right shoulder flexion in order to reach into an overhead cabinet with her left arm     Time  8    Period  Weeks    Status  New    Target Date  07/21/18      PT LONG TERM GOAL #2   Title  Patient will bend to pick item off the ground without pain in order to return to her job    Time  8    Period  Weeks    Status  New    Target Date  07/21/18      PT LONG TERM GOAL #3   Title  Patient will reach behind her back without pain in order to perfrom hygeine tasks     Time  8    Period  Weeks    Status  New    Target Date  07/21/18             Plan - 06/11/18 0913    Clinical Impression Statement  Patient had improved tolarance too manaul therapy and ther-ex today. had less tenderness to aplaption in her lumbar spine. She did well with  ther-ex. Therapy added in the nu-step for endurance. She reported liked the nu-step.     Clinical Presentation  Evolving    Clinical Decision Making  High    Rehab Potential  Good    PT Frequency  2x / week    PT Duration  8 weeks    PT Treatment/Interventions  ADLs/Self Care Home Management;Cryotherapy;Electrical Stimulation;Iontophoresis 4mg /ml Dexamethasone;Moist Heat;Traction;Ultrasound;DME Instruction;Gait training;Stair training;Functional mobility training;Therapeutic activities;Therapeutic exercise;Neuromuscular re-education;Patient/family education;Manual techniques;Taping;Passive range of motion;Dry needling    PT Next Visit Plan  continue with psoterior capsule hip stretching, LAD; soft tissue mobilzation to left hip; consder PPT; consder seated hip abdcution; consider exrecise ball roll     PT Home Exercise Plan  hamstring stretch; seated posture; supine single knee to chest if able; tennis ball trigger point release , SAQ, supine clam yellow band, ball squeeze , supine shoulder cane.     Consulted and Agree with Plan of Care  Patient       Patient will benefit from skilled therapeutic intervention in order to improve the following deficits and impairments:  Abnormal gait, Pain, Obesity, Increased fascial restricitons, Postural dysfunction, Impaired UE functional use, Decreased activity tolerance, Decreased endurance, Decreased range of motion, Decreased strength  Visit Diagnosis: Chronic bilateral low back pain without sciatica  Chronic pain of right knee  Chronic pain of left knee  Chronic left shoulder pain     Problem List Patient Active Problem List   Diagnosis Date Noted  . Left knee pain 12/30/2017  . Left shoulder pain 12/30/2017  . Closed fracture of  right tibial plateau 02/18/2017  . Benign hypertension 02/15/2017  . Screening examination for venereal disease 03/06/2015  . Encounter for long-term (current) use of medications 03/06/2015  . Elevated LFTs 12/11/2014  . Abnormal ultrasound 12/11/2014  . Abdominal pain, epigastric 12/11/2014  . ASCUS with positive high risk HPV 10/31/2013  . Gall bladder disease 02/03/2013  . 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  . Compression fracture of L3 lumbar vertebra 07/01/2011  . Thyroid dysfunction 07/01/2011  . Anemia 07/01/2011  . Sore throat 04/02/2011  . Hyperparathyroidism 02/10/2011  . Osteopenia 02/03/2011  . PAP SMEAR, LGSIL, ABNORMAL 12/21/2009  . COUGH 07/18/2009  . Chronic hepatitis C virus infection ([place]) 12/19/2008  . LEUKORRHEA 04/07/2008  . FATIGUE 04/07/2008  . BACK PAIN, LUMBAR 11/10/2007  . ALLERGIC RHINITIS 07/09/2007  . PNEUMOCYSTIS PNEUMONIA 05/07/2007  . HYPERTENSION 05/07/2007  . DENTAL CARIES 05/07/2007  . INSOMNIA 05/07/2007  . PNEUMONIA, HX OF 05/07/2007  . HERPES ZOSTER, HX OF 05/07/2007  . Human immunodeficiency virus (HIV) disease ([place]) 08/04/2006    Carney Living  PT DPT 06/11/2018, 11:16 AM  [facility] [address] [place], [place], [address] Phone: [phone]   Fax:  [phone]  Name: Vanessa Beard MRN: [id number] Date of Birth: 05/07/1951

## 2018-06-15 ENCOUNTER — Ambulatory Visit: Payer: Medicare HMO | Admitting: Physical Therapy

## 2018-06-15 ENCOUNTER — Encounter: Payer: Self-pay | Admitting: Physical Therapy

## 2018-06-15 DIAGNOSIS — M25561 Pain in right knee: Secondary | ICD-10-CM

## 2018-06-15 DIAGNOSIS — M545 Low back pain: Secondary | ICD-10-CM | POA: Diagnosis not present

## 2018-06-15 DIAGNOSIS — M25512 Pain in left shoulder: Secondary | ICD-10-CM

## 2018-06-15 DIAGNOSIS — G8929 Other chronic pain: Secondary | ICD-10-CM | POA: Diagnosis not present

## 2018-06-15 DIAGNOSIS — M25562 Pain in left knee: Secondary | ICD-10-CM | POA: Diagnosis not present

## 2018-06-16 ENCOUNTER — Telehealth: Payer: Self-pay | Admitting: Pharmacy Technician

## 2018-06-16 NOTE — Telephone Encounter (Signed)
I called to see if Vanessa Beard had enough medication to get her through until SPAP is reapproved.  She filled her medication last Wednesday and she thinks that will give enough time.  She also thinks she is reapproved due to getting a letter stating so.  I asked her to call if needed for intermediate copay assistance.  She thanked me.  Vanessa Beard. [facility] Phone: [phone] Fax:  [phone]

## 2018-06-16 NOTE — Therapy (Signed)
[place], [place], [address] Phone: [phone]   Fax:  [phone]  Physical Therapy Treatment  Patient Details  Name: Vanessa Beard MRN: [id number] Date of Birth: 01/10/1951 Referring Provider (PT): Dr Eunice Blase    Encounter Date: 06/15/2018  PT End of Session - 06/16/18 1357    Visit Number  5    Number of Visits  16    Date for PT Re-Evaluation  07/21/18    Authorization Type  [employer] MCR 10 visit progress note. 40 dollar co-pay but all charges being covered by her insurance     PT Start Time  0345    PT Stop Time  0440    PT Time Calculation (min)  55 min    Activity Tolerance  Patient tolerated treatment well    Behavior During Therapy  [facility] for tasks assessed/performed       Past Medical History:  Diagnosis Date  . Allergic rhinitis   . Biliary colic   . Gall bladder disease \  . Hepatitis C   . Herpes   . HIV infection ([place])   . Hyperparathyroidism ([place])   . Hypertension   . Insomnia   . Osteoporosis   . Pneumonia    2010    Past Surgical History:  Procedure Laterality Date  . CHOLECYSTECTOMY N/A 01/09/2015   Procedure: LAPAROSCOPIC CHOLECYSTECTOMY;  Surgeon: [name], MD;  Location: [place];  Service: General;  Laterality: N/A;  . ECTOPIC PREGNANCY SURGERY    . ORIF TIBIA PLATEAU Right 02/18/2017   Procedure: OPEN REDUCTION INTERNAL FIXATION (ORIF) RIGHT TIBIAL PLATEAU;  Surgeon: Leandrew Koyanagi, MD;  Location: [place];  Service: Orthopedics;  Laterality: Right;  . SHOULDER SURGERY Right 12/2014    There were no vitals filed for this visit.  Subjective Assessment - 06/16/18 1353    Subjective  Patient reports her back has been feeling a little better. She is having less pain with her functional activity. She has been working on her stretches and exercises.     How long can you sit comfortably?  stiffens after sitting for too long     How long can you stand comfortably?  <  45 min     How long can you walk comfortably?  limited community distances     Diagnostic tests  X-ray R knee: old healed tibial platue fx/ left knee: moderate tri-comp arthritis; Lumbar spine old L4 copression fx     Patient Stated Goals  Less pain overall     Currently in Pain?  Yes    Pain Score  2     Pain Location  Back    Pain Orientation  Left;Mid    Pain Descriptors / Indicators  Aching    Pain Type  Acute pain    Pain Onset  More than a month ago    Pain Frequency  Constant    Aggravating Factors   bending and standing     Pain Relieving Factors  rest     Effect of Pain on Daily Activities  difficulty standing to perfrom ADL's                        [facility] Adult PT Treatment/Exercise - 06/16/18 0001      Ambulation/Gait   Gait Comments  decreased single leg stance time on the lkeft; walks with flexed trunk; decreased hip flexion bilateral; significant lateral trunk motion.  Lumbar Exercises: Stretches   Active Hamstring Stretch Limitations  setad 3x20 sec hold with cuing not to flex into pain     Lower Trunk Rotation  5 reps    Other Lumbar Stretch Exercise  with towel gentle single knee to chest stretch 3x15 sec hold       Lumbar Exercises: Standing   Other Standing Lumbar Exercises  scap retraction yellow 2x10; shoulder extension 2x10 yellow       Lumbar Exercises: Seated   Other Seated Lumbar Exercises  ball squeeze with abdominal squeeze 2x10; hip aduction 2x10 yellow band       Lumbar Exercises: Supine   Clam  20 reps    Clam Limitations  yellow band wih ab draw in    Bent Knee Raise  10 reps    Bent Knee Raise Limitations  bilateral       Electrical Stimulation   Electrical Stimulation Location  Lumbar    Electrical Stimulation Action  IFC     Electrical Stimulation Parameters  to tolerance     Electrical Stimulation Goals  Pain      Manual Therapy   Manual Therapy  Joint mobilization;Soft tissue mobilization;Manual Traction    Soft  tissue mobilization  trigger point release to bilateral thoracic and lumbar spine; tiger tail to left gluteal     Manual Traction  of left hip. Significant improvement in pain noted              PT Education - 06/16/18 1354    Education Details  reviewed HEP and symptom mangement     Person(s) Educated  Patient    Methods  Explanation;Demonstration;Tactile cues;Verbal cues;Handout    Comprehension  Verbalized understanding;Returned demonstration;Verbal cues required;Tactile cues required       PT Short Term Goals - 06/08/18 1609      PT SHORT TERM GOAL #1   Title  Patient will increase passive shoulder ROM t0 145    Time  4    Period  Weeks    Status  On-going      PT SHORT TERM GOAL #2   Title  Patient will improve lumbar flexion motion by 50%     Time  4    Period  Weeks    Status  On-going      PT SHORT TERM GOAL #3   Title  Patient will report <2/10 at worst     Time  4    Period  Weeks    Status  On-going        PT Long Term Goals - 05/26/18 0849      PT LONG TERM GOAL #1   Title  Patient will demostrate functional right shoulder flexion in order to reach into an overhead cabinet with her left arm     Time  8    Period  Weeks    Status  New    Target Date  07/21/18      PT LONG TERM GOAL #2   Title  Patient will bend to pick item off the ground without pain in order to return to her job    Time  8    Period  Weeks    Status  New    Target Date  07/21/18      PT LONG TERM GOAL #3   Title  Patient will reach behind her back without pain in order to perfrom hygeine tasks     Time  8  Period  Weeks    Status  New    Target Date  07/21/18            Plan - 06/16/18 1402    Clinical Impression Statement  Patient had improved tolerance to palpation today. She tolerated treatment well. Therapy reviewed standing exercises with the patient. She was able to perfrom withmin cuing. Patient encouraged to continue with exercises at home.     Clinical  Presentation  Evolving    Clinical Decision Making  High    Rehab Potential  Good    PT Frequency  2x / week    PT Duration  8 weeks    PT Treatment/Interventions  ADLs/Self Care Home Management;Cryotherapy;Electrical Stimulation;Iontophoresis 4mg /ml Dexamethasone;Moist Heat;Traction;Ultrasound;DME Instruction;Gait training;Stair training;Functional mobility training;Therapeutic activities;Therapeutic exercise;Neuromuscular re-education;Patient/family education;Manual techniques;Taping;Passive range of motion;Dry needling    PT Next Visit Plan  continue with psoterior capsule hip stretching, LAD; soft tissue mobilzation to left hip; consder PPT; consder seated hip abdcution; consider exrecise ball roll     PT Home Exercise Plan  hamstring stretch; seated posture; supine single knee to chest if able; tennis ball trigger point release , SAQ, supine clam yellow band, ball squeeze , supine shoulder cane.     Consulted and Agree with Plan of Care  Patient       Patient will benefit from skilled therapeutic intervention in order to improve the following deficits and impairments:  Abnormal gait, Pain, Obesity, Increased fascial restricitons, Postural dysfunction, Impaired UE functional use, Decreased activity tolerance, Decreased endurance, Decreased range of motion, Decreased strength  Visit Diagnosis: Chronic bilateral low back pain without sciatica  Chronic pain of right knee  Chronic pain of left knee  Chronic left shoulder pain     Problem List Patient Active Problem List   Diagnosis Date Noted  . Left knee pain 12/30/2017  . Left shoulder pain 12/30/2017  . Closed fracture of right tibial plateau 02/18/2017  . Benign hypertension 02/15/2017  . Screening examination for venereal disease 03/06/2015  . Encounter for long-term (current) use of medications 03/06/2015  . Elevated LFTs 12/11/2014  . Abnormal ultrasound 12/11/2014  . Abdominal pain, epigastric 12/11/2014  . ASCUS with  positive high risk HPV 10/31/2013  . Gall bladder disease 02/03/2013  . 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  . Compression fracture of L3 lumbar vertebra 07/01/2011  . Thyroid dysfunction 07/01/2011  . Anemia 07/01/2011  . Sore throat 04/02/2011  . Hyperparathyroidism 02/10/2011  . Osteopenia 02/03/2011  . PAP SMEAR, LGSIL, ABNORMAL 12/21/2009  . COUGH 07/18/2009  . Chronic hepatitis C virus infection ([place]) 12/19/2008  . LEUKORRHEA 04/07/2008  . FATIGUE 04/07/2008  . BACK PAIN, LUMBAR 11/10/2007  . ALLERGIC RHINITIS 07/09/2007  . PNEUMOCYSTIS PNEUMONIA 05/07/2007  . HYPERTENSION 05/07/2007  . DENTAL CARIES 05/07/2007  . INSOMNIA 05/07/2007  . PNEUMONIA, HX OF 05/07/2007  . HERPES ZOSTER, HX OF 05/07/2007  . Human immunodeficiency virus (HIV) disease ([place]) 08/04/2006    Carney Living PT DPT  06/16/2018, 2:05 PM  [facility] [address] [place], [place], [address] Phone: [phone]   Fax:  [phone]  Name: Vanessa Beard MRN: [id number] Date of Birth: 1951-04-16

## 2018-06-17 ENCOUNTER — Ambulatory Visit: Payer: Medicare HMO | Admitting: Physical Therapy

## 2018-06-23 ENCOUNTER — Ambulatory Visit: Payer: Medicare HMO | Attending: Family Medicine | Admitting: Physical Therapy

## 2018-06-23 ENCOUNTER — Encounter: Payer: Self-pay | Admitting: Physical Therapy

## 2018-06-23 DIAGNOSIS — M25561 Pain in right knee: Secondary | ICD-10-CM | POA: Insufficient documentation

## 2018-06-23 DIAGNOSIS — M25512 Pain in left shoulder: Secondary | ICD-10-CM | POA: Insufficient documentation

## 2018-06-23 DIAGNOSIS — M545 Low back pain: Secondary | ICD-10-CM | POA: Diagnosis not present

## 2018-06-23 DIAGNOSIS — M25562 Pain in left knee: Secondary | ICD-10-CM | POA: Insufficient documentation

## 2018-06-23 DIAGNOSIS — G8929 Other chronic pain: Secondary | ICD-10-CM | POA: Diagnosis not present

## 2018-06-24 NOTE — Therapy (Signed)
[place], [place], [address] Phone: [phone]   Fax:  [phone]  Physical Therapy Treatment  Patient Details  Name: Vanessa Beard MRN: [id number] Date of Birth: 08-Nov-1950 Referring Provider (PT): Dr Eunice Blase    Encounter Date: 06/23/2018  PT End of Session - 06/24/18 1006    Visit Number  6    Number of Visits  16    Date for PT Re-Evaluation  07/21/18    Authorization Type  [employer] M[employer] 10 visit progress note. 40 dollar co-pay but all charges being covered by her insurance     PT Start Time  1330    PT Stop Time  1412    PT Time Calculation (min)  42 min    Activity Tolerance  Patient tolerated treatment well    Behavior During Therapy  WFL for tasks assessed/performed       Past Medical History:  Diagnosis Date  . Allergic rhinitis   . Biliary colic   . Gall bladder disease \  . Hepatitis C   . Herpes   . HIV infection ([place])   . Hyperparathyroidism ([place])   . Hypertension   . Insomnia   . Osteoporosis   . Pneumonia    2010    Past Surgical History:  Procedure Laterality Date  . CHOLECYSTECTOMY N/A 01/09/2015   Procedure: LAPAROSCOPIC CHOLECYSTECTOMY;  Surgeon: [name], MD;  Location: [facility];  Service: General;  Laterality: N/A;  . ECTOPIC PREGNANCY SURGERY    . ORIF TIBIA PLATEAU Right 02/18/2017   Procedure: OPEN REDUCTION INTERNAL FIXATION (ORIF) RIGHT TIBIAL PLATEAU;  Surgeon: Leandrew Koyanagi, MD;  Location: [facility];  Service: Orthopedics;  Laterality: Right;  . SHOULDER SURGERY Right 12/2014    There were no vitals filed for this visit.  Subjective Assessment - 06/23/18 1412    Subjective  Patient reports she is sore today. She has been sitting at her computer all morning. She has been spending a lot of time at her omputer.    How long can you sit comfortably?  stiffens after sitting for too long     How long can you stand comfortably?  < 45 min     How long can you  walk comfortably?  limited community distances     Diagnostic tests  X-ray R knee: old healed tibial platue fx/ left knee: moderate tri-comp arthritis; Lumbar spine old L4 copression fx     Patient Stated Goals  Less pain overall     Currently in Pain?  Yes    Pain Score  7     Pain Location  Back    Pain Orientation  Left;Mid    Pain Descriptors / Indicators  Aching    Pain Type  Acute pain    Pain Onset  More than a month ago    Pain Frequency  Constant    Aggravating Factors   bending and standing     Pain Relieving Factors  rest     Effect of Pain on Daily Activities  difficulty standing to perfrom ADL's                        [facility] Adult PT Treatment/Exercise - 06/24/18 0001      Lumbar Exercises: Stretches   Active Hamstring Stretch Limitations  setad 3x20 sec hold with cuing not to flex into pain     Lower Trunk Rotation  5 reps  Piriformis Stretch Limitations  piriformis stretch 3x20 sec hold       Lumbar Exercises: Aerobic   Nustep  5 min       Lumbar Exercises: Seated   Other Seated Lumbar Exercises  seated bilaterl ER 2x10 yellow; seated shoulder abduction yellow       Lumbar Exercises: Supine   Clam  20 reps    Clam Limitations  yellow band wih ab draw in    Bent Knee Raise  10 reps    Bent Knee Raise Limitations  bilateral     Other Supine Lumbar Exercises  supine march x10       Electrical Stimulation   Electrical Stimulation Location  Lumbar    Electrical Stimulation Action  IFC     Electrical Stimulation Parameters  to tolerance     Electrical Stimulation Goals  Pain      Manual Therapy   Manual Therapy  Joint mobilization;Soft tissue mobilization;Manual Traction    Soft tissue mobilization  trigger point release to bilateral thoracic and lumbar spine; tiger tail to left gluteal     Manual Traction  of left hip. Significant improvement in pain noted              PT Education - 06/24/18 1006    Education Details  HEP, symptom  mangement     Person(s) Educated  Patient    Methods  Explanation;Demonstration;Tactile cues;Verbal cues;Handout    Comprehension  Verbalized understanding;Returned demonstration;Verbal cues required;Tactile cues required       PT Short Term Goals - 06/08/18 1609      PT SHORT TERM GOAL #1   Title  Patient will increase passive shoulder ROM t0 145    Time  4    Period  Weeks    Status  On-going      PT SHORT TERM GOAL #2   Title  Patient will improve lumbar flexion motion by 50%     Time  4    Period  Weeks    Status  On-going      PT SHORT TERM GOAL #3   Title  Patient will report <2/10 at worst     Time  4    Period  Weeks    Status  On-going        PT Long Term Goals - 05/26/18 0849      PT LONG TERM GOAL #1   Title  Patient will demostrate functional right shoulder flexion in order to reach into an overhead cabinet with her left arm     Time  8    Period  Weeks    Status  New    [place] Date  07/21/18      PT LONG TERM GOAL #2   Title  Patient will bend to pick item off the ground without pain in order to return to her job    Time  8    Period  Weeks    Status  New    Target Date  07/21/18      PT LONG TERM GOAL #3   Title  Patient will reach behind her back without pain in order to perfrom hygeine tasks     Time  8    Period  Weeks    Status  New    Target Date  07/21/18            Plan - 06/24/18 1007    Clinical Impression Statement  Patient is making some  progress but she is still in pain. She feels like it is releasted to her activity that she is doing. At this point she is nearing max benefit from therapy. She will come for 1 more visit to review HEP unless she has more improvement. Re-assess next visit. She had less tenderness to palpation this visit.     Clinical Presentation  Evolving    Clinical Decision Making  High    Rehab Potential  Good    PT Frequency  2x / week    PT Duration  8 weeks    PT Treatment/Interventions  ADLs/Self Care  Home Management;Cryotherapy;Electrical Stimulation;Iontophoresis 4mg /ml Dexamethasone;Moist Heat;Traction;Ultrasound;DME Instruction;Gait training;Stair training;Functional mobility training;Therapeutic activities;Therapeutic exercise;Neuromuscular re-education;Patient/family education;Manual techniques;Taping;Passive range of motion;Dry needling    PT Next Visit Plan  continue with psoterior capsule hip stretching, LAD; soft tissue mobilzation to left hip; consder PPT; consder seated hip abdcution; consider exrecise ball roll     PT Home Exercise Plan  hamstring stretch; seated posture; supine single knee to chest if able; tennis ball trigger point release , SAQ, supine clam yellow band, ball squeeze , supine shoulder cane.     Consulted and Agree with Plan of Care  Patient       Patient will benefit from skilled therapeutic intervention in order to improve the following deficits and impairments:  Abnormal gait, Pain, Obesity, Increased fascial restricitons, Postural dysfunction, Impaired UE functional use, Decreased activity tolerance, Decreased endurance, Decreased range of motion, Decreased strength  Visit Diagnosis: Chronic bilateral low back pain without sciatica  Chronic pain of right knee  Chronic pain of left knee  Chronic left shoulder pain     Problem List Patient Active Problem List   Diagnosis Date Noted  . Left knee pain 12/30/2017  . Left shoulder pain 12/30/2017  . Closed fracture of right tibial plateau 02/18/2017  . Benign hypertension 02/15/2017  . Screening examination for venereal disease 03/06/2015  . Encounter for long-term (current) use of medications 03/06/2015  . Elevated LFTs 12/11/2014  . Abnormal ultrasound 12/11/2014  . Abdominal pain, epigastric 12/11/2014  . ASCUS with positive high risk HPV 10/31/2013  . Gall bladder disease 02/03/2013  . 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  . Compression fracture of L3 lumbar vertebra  07/01/2011  . Thyroid dysfunction 07/01/2011  . Anemia 07/01/2011  . Sore throat 04/02/2011  . Hyperparathyroidism 02/10/2011  . Osteopenia 02/03/2011  . PAP SMEAR, LGSIL, ABNORMAL 12/21/2009  . COUGH 07/18/2009  . Chronic hepatitis C virus infection ([place]) 12/19/2008  . LEUKORRHEA 04/07/2008  . FATIGUE 04/07/2008  . BACK PAIN, LUMBAR 11/10/2007  . ALLERGIC RHINITIS 07/09/2007  . PNEUMOCYSTIS PNEUMONIA 05/07/2007  . HYPERTENSION 05/07/2007  . DENTAL CARIES 05/07/2007  . INSOMNIA 05/07/2007  . PNEUMONIA, HX OF 05/07/2007  . HERPES ZOSTER, HX OF 05/07/2007  . Human immunodeficiency virus (HIV) disease ([place]) 08/04/2006    Carney Living PT DPT  06/24/2018, 10:15 AM  [facility] [address] [place], [place], [address] Phone: [phone]   Fax:  [phone]  Name: PENNELOPE BASQUE MRN: [id number] Date of Birth: 07-Oct-1950

## 2018-06-26 ENCOUNTER — Other Ambulatory Visit: Payer: Self-pay | Admitting: Internal Medicine

## 2018-06-26 DIAGNOSIS — B20 Human immunodeficiency virus [HIV] disease: Secondary | ICD-10-CM

## 2018-06-30 ENCOUNTER — Ambulatory Visit (INDEPENDENT_AMBULATORY_CARE_PROVIDER_SITE_OTHER): Payer: Medicare HMO | Admitting: Family

## 2018-06-30 ENCOUNTER — Encounter: Payer: Self-pay | Admitting: Family

## 2018-06-30 VITALS — BP 163/106 | HR 70 | Temp 97.7°F | Ht 60.0 in | Wt 168.5 lb

## 2018-06-30 DIAGNOSIS — Z113 Encounter for screening for infections with a predominantly sexual mode of transmission: Secondary | ICD-10-CM | POA: Diagnosis not present

## 2018-06-30 DIAGNOSIS — Z0000 Encounter for general adult medical examination without abnormal findings: Secondary | ICD-10-CM | POA: Diagnosis not present

## 2018-06-30 DIAGNOSIS — B20 Human immunodeficiency virus [HIV] disease: Secondary | ICD-10-CM | POA: Diagnosis not present

## 2018-06-30 DIAGNOSIS — Z9189 Other specified personal risk factors, not elsewhere classified: Secondary | ICD-10-CM | POA: Diagnosis not present

## 2018-06-30 DIAGNOSIS — R69 Illness, unspecified: Secondary | ICD-10-CM | POA: Diagnosis not present

## 2018-06-30 MED ORDER — ABACAVIR-DOLUTEGRAVIR-LAMIVUD 600-50-300 MG PO TABS: 1.0000 | ORAL_TABLET | Freq: Every day | ORAL | 1 refills | Status: DC
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
## 2018-06-30 NOTE — Assessment & Plan Note (Signed)
Vanessa Beard has generally well controlled HIV disease with good adherence and tolerance to Triumeq. No signs/symptoms of opportunistic infection or progressive HIV disease. Will check blood work today. Continue current dose of Triumeq. Plan for office visit in 4 months or sooner if needed with blood work 1-2 weeks prior to appointment.

## 2018-06-30 NOTE — Assessment & Plan Note (Signed)
   Declines Pneumovax. All other immunizations are up to date.  Cervical cancer screening, breast cancer screening and dental exams are up to date  Due for colon cancer screening and will follow up with primary care for Cologuard.  Discussed importance of safe sexual practice to reduce risk of acquisition and transmission of STI. Declines condoms.

## 2018-06-30 NOTE — Patient Instructions (Signed)
Nice to meet you!  We will check your blood work today.  Continue to take your Triumeq as prescribed.  Plan for follow up in 4 months or sooner if needed with lab work 1-2 weeks prior to appointment.

## 2018-07-01 LAB — T-HELPER CELL (CD4) - (RCID CLINIC ONLY)
CD4 % Helper T Cell: 20 % — ABNORMAL LOW (ref 33–55)
CD4 T Cell Abs: 300 /uL — ABNORMAL LOW (ref 400–2700)

## 2018-07-02 ENCOUNTER — Encounter

## 2018-07-02 LAB — CBC
HCT: 38.3 % (ref 35.0–45.0)
Hemoglobin: 12.9 g/dL (ref 11.7–15.5)
MCH: 29.8 pg (ref 27.0–33.0)
MCHC: 33.7 g/dL (ref 32.0–36.0)
MCV: 88.5 fL (ref 80.0–100.0)
MPV: 11.3 fL (ref 7.5–12.5)
Platelets: 185 10*3/uL (ref 140–400)
RBC: 4.33 10*6/uL (ref 3.80–5.10)
RDW: 13 % (ref 11.0–15.0)
WBC: 5.3 10*3/uL (ref 3.8–10.8)

## 2018-07-02 LAB — COMPLETE METABOLIC PANEL WITH GFR
AG Ratio: 1 (calc) (ref 1.0–2.5)
ALT: 10 U/L (ref 6–29)
AST: 21 U/L (ref 10–35)
Albumin: 3.9 g/dL (ref 3.6–5.1)
Alkaline phosphatase (APISO): 90 U/L (ref 37–153)
BUN: 13 mg/dL (ref 7–25)
CO2: 26 mmol/L (ref 20–32)
Calcium: 9.6 mg/dL (ref 8.6–10.4)
Chloride: 107 mmol/L (ref 98–110)
Creat: 0.94 mg/dL (ref 0.50–0.99)
GFR, Est African American: 72 mL/min/{1.73_m2} (ref >=60)
GFR, Est Non African American: 62 mL/min/{1.73_m2} (ref >=60)
Globulin: 4.1 g/dL (calc) — ABNORMAL HIGH (ref 1.9–3.7)
Glucose, Bld: 91 mg/dL (ref 65–99)
Potassium: 3.9 mmol/L (ref 3.5–5.3)
Sodium: 141 mmol/L (ref 135–146)
Total Bilirubin: 0.6 mg/dL (ref 0.2–1.2)
Total Protein: 8 g/dL (ref 6.1–8.1)

## 2018-07-02 LAB — RPR: RPR Ser Ql: NONREACTIVE

## 2018-07-02 LAB — LIPID PANEL
Cholesterol: 154 mg/dL (ref <200)
HDL: 57 mg/dL (ref >=50)
LDL Cholesterol (Calc): 79 mg/dL (calc)
Non-HDL Cholesterol (Calc): 97 mg/dL (calc) (ref <130)
Total CHOL/HDL Ratio: 2.7 (calc) (ref <5.0)
Triglycerides: 95 mg/dL (ref <150)

## 2018-07-02 LAB — HIV-1 RNA QUANT-NO REFLEX-BLD
HIV 1 RNA Quant: <20 copies/mL — AB
HIV-1 RNA Quant, Log: <1.3 Log copies/mL — AB

## 2018-07-08 ENCOUNTER — Ambulatory Visit: Payer: Medicare HMO | Admitting: Physical Therapy

## 2018-07-08 DIAGNOSIS — M25562 Pain in left knee: Secondary | ICD-10-CM | POA: Diagnosis not present

## 2018-07-08 DIAGNOSIS — G8929 Other chronic pain: Secondary | ICD-10-CM | POA: Diagnosis not present

## 2018-07-08 DIAGNOSIS — M545 Low back pain, unspecified: Secondary | ICD-10-CM

## 2018-07-08 DIAGNOSIS — M25512 Pain in left shoulder: Secondary | ICD-10-CM

## 2018-07-08 DIAGNOSIS — M25561 Pain in right knee: Secondary | ICD-10-CM

## 2018-07-09 ENCOUNTER — Encounter: Payer: Self-pay | Admitting: Physical Therapy
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
## 2018-08-06 ENCOUNTER — Ambulatory Visit (INDEPENDENT_AMBULATORY_CARE_PROVIDER_SITE_OTHER): Payer: Medicare HMO | Admitting: Family Medicine

## 2018-08-06 ENCOUNTER — Other Ambulatory Visit: Payer: Self-pay

## 2018-08-06 ENCOUNTER — Encounter (INDEPENDENT_AMBULATORY_CARE_PROVIDER_SITE_OTHER): Payer: Self-pay | Admitting: Family Medicine

## 2018-08-06 DIAGNOSIS — M25561 Pain in right knee: Secondary | ICD-10-CM | POA: Diagnosis not present

## 2018-08-06 DIAGNOSIS — M25512 Pain in left shoulder: Secondary | ICD-10-CM

## 2018-08-06 DIAGNOSIS — M5442 Lumbago with sciatica, left side: Secondary | ICD-10-CM | POA: Diagnosis not present

## 2018-08-06 DIAGNOSIS — M25562 Pain in left knee: Secondary | ICD-10-CM | POA: Diagnosis not present

## 2018-08-06 MED ORDER — PREDNISONE 10 MG PO TABS: ORAL_TABLET | ORAL | 0 refills | Status: DC
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
## 2018-09-17 DIAGNOSIS — G4700 Insomnia, unspecified: Secondary | ICD-10-CM | POA: Diagnosis not present

## 2018-09-17 DIAGNOSIS — E669 Obesity, unspecified: Secondary | ICD-10-CM | POA: Diagnosis not present

## 2018-09-17 DIAGNOSIS — R69 Illness, unspecified: Secondary | ICD-10-CM | POA: Diagnosis not present

## 2018-09-17 DIAGNOSIS — I10 Essential (primary) hypertension: Secondary | ICD-10-CM | POA: Diagnosis not present

## 2018-09-30 ENCOUNTER — Telehealth: Payer: Self-pay | Admitting: Internal Medicine

## 2018-09-30 NOTE — Telephone Encounter (Signed)
COVID-19 Pre-Screening Questions:09/30/18   Do you currently have a fever (>100 F), chills or unexplained body aches? NO  Are you currently experiencing new cough, shortness of breath, sore throat, runny nose? NO    Have you recently travelled outside the state of [place] in the last 14 days? NO   Have you been in contact with someone that is currently pending confirmation of Covid19 testing or has been confirmed to have the [place] virus?  NO  **If the patient answers NO to ALL questions -  advise the patient to please call the clinic before coming to the office should any symptoms develop.

## 2018-10-04 ENCOUNTER — Other Ambulatory Visit: Payer: Self-pay

## 2018-10-04 ENCOUNTER — Other Ambulatory Visit: Payer: Medicare HMO

## 2018-10-04 DIAGNOSIS — R69 Illness, unspecified: Secondary | ICD-10-CM | POA: Diagnosis not present

## 2018-10-04 DIAGNOSIS — B20 Human immunodeficiency virus [HIV] disease: Secondary | ICD-10-CM

## 2018-10-05 LAB — T-HELPER CELL (CD4) - (RCID CLINIC ONLY)
CD4 % Helper T Cell: 20 % — ABNORMAL LOW (ref 33–65)
CD4 T Cell Abs: 213 /uL — ABNORMAL LOW (ref 400–1790)

## 2018-10-09 LAB — CBC
HCT: 37.9 % (ref 35.0–45.0)
Hemoglobin: 13 g/dL (ref 11.7–15.5)
MCH: 30.2 pg (ref 27.0–33.0)
MCHC: 34.3 g/dL (ref 32.0–36.0)
MCV: 88.1 fL (ref 80.0–100.0)
MPV: 11.6 fL (ref 7.5–12.5)
Platelets: 180 10*3/uL (ref 140–400)
RBC: 4.3 10*6/uL (ref 3.80–5.10)
RDW: 13.1 % (ref 11.0–15.0)
WBC: 4.8 10*3/uL (ref 3.8–10.8)

## 2018-10-09 LAB — COMPREHENSIVE METABOLIC PANEL
AG Ratio: 1.1 (calc) (ref 1.0–2.5)
ALT: 10 U/L (ref 6–29)
AST: 21 U/L (ref 10–35)
Albumin: 4 g/dL (ref 3.6–5.1)
Alkaline phosphatase (APISO): 85 U/L (ref 37–153)
BUN: 21 mg/dL (ref 7–25)
CO2: 25 mmol/L (ref 20–32)
Calcium: 9.7 mg/dL (ref 8.6–10.4)
Chloride: 108 mmol/L (ref 98–110)
Creat: 0.98 mg/dL (ref 0.50–0.99)
Globulin: 3.8 g/dL (calc) — ABNORMAL HIGH (ref 1.9–3.7)
Glucose, Bld: 88 mg/dL (ref 65–99)
Potassium: 3.7 mmol/L (ref 3.5–5.3)
Sodium: 141 mmol/L (ref 135–146)
Total Bilirubin: 0.6 mg/dL (ref 0.2–1.2)
Total Protein: 7.8 g/dL (ref 6.1–8.1)

## 2018-10-09 LAB — RPR: RPR Ser Ql: NONREACTIVE

## 2018-10-09 LAB — HIV-1 RNA QUANT-NO REFLEX-BLD
HIV 1 RNA Quant: <20 copies/mL
HIV-1 RNA Quant, Log: <1.3 Log copies/mL

## 2018-10-18 ENCOUNTER — Other Ambulatory Visit: Payer: Self-pay

## 2018-10-18 ENCOUNTER — Ambulatory Visit (INDEPENDENT_AMBULATORY_CARE_PROVIDER_SITE_OTHER): Payer: Medicare HMO | Admitting: Internal Medicine

## 2018-10-18 ENCOUNTER — Encounter: Payer: Self-pay | Admitting: Internal Medicine

## 2018-10-18 VITALS — BP 135/87 | HR 78 | Temp 98.0°F | Wt 165.0 lb

## 2018-10-18 DIAGNOSIS — R69 Illness, unspecified: Secondary | ICD-10-CM | POA: Diagnosis not present

## 2018-10-18 DIAGNOSIS — Z79899 Other long term (current) drug therapy: Secondary | ICD-10-CM

## 2018-10-18 DIAGNOSIS — M19012 Primary osteoarthritis, left shoulder: Secondary | ICD-10-CM

## 2018-10-18 DIAGNOSIS — M19011 Primary osteoarthritis, right shoulder: Secondary | ICD-10-CM | POA: Diagnosis not present

## 2018-10-18 DIAGNOSIS — B20 Human immunodeficiency virus [HIV] disease: Secondary | ICD-10-CM | POA: Diagnosis not present

## 2018-10-18 NOTE — Progress Notes (Signed)
RFV: follow up for hiv disease  Patient ID: Vanessa Beard, female   DOB: 05/01/1951, 68 y.o.   MRN: [id number]  HPI 68yo F with hiv disease on triumeq. 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 She hasn't had any exposure to covid-19 as she is aware of. She contineus ot take meds daily without issues. Overall no complaints on her health- othe rthan has ongoing arthriitis of shoulders and knees  Outpatient Encounter Medications as of 10/18/2018  Medication Sig  . abacavir-dolutegravir-lamiVUDine (TRIUMEQ) 600-50-300 MG tablet Take 1 tablet by mouth daily.  Marland Kitchen amLODipine (NORVASC) 10 MG tablet Take 1 tablet (10 mg total) by mouth daily.  Marland Kitchen HYDROcodone-acetaminophen (NORCO/VICODIN) 5-325 MG tablet Take 1-2 tablets by mouth at bedtime as needed for moderate pain.  Marland Kitchen ibuprofen (ADVIL,MOTRIN) 800 MG tablet TAKE 1 TABLET(800 MG) BY MOUTH EVERY 8 HOURS AS NEEDED  . Multiple Vitamins-Minerals (ADULT GUMMY) CHEW Chew 3 capsules by mouth daily.  Marland Kitchen zolpidem (AMBIEN) 10 MG tablet Take 1 tablet (10 mg total) by mouth at bedtime as needed.  . baclofen (LIORESAL) 10 MG tablet Take 1 tablet (10 mg total) by mouth 3 (three) times daily as needed for muscle spasms. (Patient not taking: Reported on 10/18/2018)  . predniSONE (DELTASONE) 10 MG tablet Take as directed for 12 days.  Daily dose 6,6,5,5,4,4,3,3,2,2,1,1. (Patient not taking: Reported on 10/18/2018)   No facility-administered encounter medications on file as of 10/18/2018.      Patient Active Problem List   Diagnosis Date Noted  . Healthcare maintenance 06/30/2018  . Left knee pain 12/30/2017  . Left shoulder pain 12/30/2017  . Closed fracture of right tibial plateau 02/18/2017  . Benign hypertension 02/15/2017  . Screening examination for venereal disease 03/06/2015  . Encounter for long-term (current) use of medications 03/06/2015  . Elevated LFTs 12/11/2014  . Abnormal ultrasound 12/11/2014  . Abdominal pain, epigastric  12/11/2014  . ASCUS with positive high risk HPV 10/31/2013  . Gall bladder disease 02/03/2013  . 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  . Compression fracture of L3 lumbar vertebra 07/01/2011  . Thyroid dysfunction 07/01/2011  . Anemia 07/01/2011  . Sore throat 04/02/2011  . Hyperparathyroidism 02/10/2011  . Osteopenia 02/03/2011  . PAP SMEAR, LGSIL, ABNORMAL 12/21/2009  . COUGH 07/18/2009  . Chronic hepatitis C virus infection ([place]) 12/19/2008  . LEUKORRHEA 04/07/2008  . FATIGUE 04/07/2008  . BACK PAIN, LUMBAR 11/10/2007  . ALLERGIC RHINITIS 07/09/2007  . PNEUMOCYSTIS PNEUMONIA 05/07/2007  . HYPERTENSION 05/07/2007  . DENTAL CARIES 05/07/2007  . INSOMNIA 05/07/2007  . PNEUMONIA, HX OF 05/07/2007  . HERPES ZOSTER, HX OF 05/07/2007  . Human immunodeficiency virus (HIV) disease ([place]) 08/04/2006     Health Maintenance Due  Topic Date Due  . PAP SMEAR-Modifier  11/28/2017  . MAMMOGRAM  10/14/2018     Review of Systems 12 point ros is negative otherwhat what is mentioned in hpi Physical Exam   BP 135/87   Pulse 78   Temp 98 F (36.7 C) (Oral)   Wt 165 lb (74.8 kg)   BMI 32.22 kg/m   Physical Exam  Constitutional:  oriented to person, place, and time. appears well-developed and well-nourished. No distress.  HENT: [place]/AT, PERRLA, no scleral icterus Mouth/Throat: Oropharynx is clear and moist. No oropharyngeal exudate.  Cardiovascular: Normal rate, regular rhythm and normal heart sounds. Exam reveals no gallop and no friction rub.  No murmur heard.  Pulmonary/Chest: Effort normal and breath sounds normal. No respiratory  distress.  has no wheezes.  Neck = supple, no nuchal rigidity Abdominal: Soft. Bowel sounds are normal.  exhibits no distension. There is no tenderness.  Lymphadenopathy: no cervical adenopathy. No axillary adenopathy Neurological: alert and oriented to person, place, and time.  Skin: Skin is warm and dry. No rash noted. No  erythema.  Psychiatric: a normal mood and affect.  behavior is normal.   Lab Results  Component Value Date   CD4TCELL 20 (L) 10/04/2018   Lab Results  Component Value Date   CD4TABS 213 (L) 10/04/2018   CD4TABS 300 (L) 06/30/2018   CD4TABS 190 (L) 02/24/2018   Lab Results  Component Value Date   HIV1RNAQUANT <20 NOT DETECTED 10/04/2018   Lab Results  Component Value Date   HEPBSAB NEG 12/29/2013   Lab Results  Component Value Date   LABRPR NON-REACTIVE 10/04/2018    CBC Lab Results  Component Value Date   WBC 4.8 10/04/2018   RBC 4.30 10/04/2018   HGB 13.0 10/04/2018   HCT 37.9 10/04/2018   PLT 180 10/04/2018   MCV 88.1 10/04/2018   MCH 30.2 10/04/2018   MCHC 34.3 10/04/2018   RDW 13.1 10/04/2018   LYMPHSABS 1,201 02/24/2018   MONOABS 0.3 02/15/2017   EOSABS 79 02/24/2018    BMET Lab Results  Component Value Date   NA 141 10/04/2018   K 3.7 10/04/2018   CL 108 10/04/2018   CO2 25 10/04/2018   GLUCOSE 88 10/04/2018   BUN 21 10/04/2018   CREATININE 0.98 10/04/2018   CALCIUM 9.7 10/04/2018   GFRNONAA 62 06/30/2018   GFRAA 72 06/30/2018      Assessment and Plan  hiv disease =continue on current regimen  Long term medication management = cr remains stable. No need to adjust medication  Health maintenance = will see back in 3 months, nad ensure she gets mammogram

## 2018-10-27 ENCOUNTER — Other Ambulatory Visit: Payer: Self-pay | Admitting: Internal Medicine

## 2018-10-27 DIAGNOSIS — I10 Essential (primary) hypertension: Secondary | ICD-10-CM | POA: Diagnosis not present

## 2018-10-27 DIAGNOSIS — N390 Urinary tract infection, site not specified: Secondary | ICD-10-CM | POA: Diagnosis not present

## 2018-10-27 DIAGNOSIS — Z1231 Encounter for screening mammogram for malignant neoplasm of breast: Secondary | ICD-10-CM

## 2018-11-09 ENCOUNTER — Ambulatory Visit (INDEPENDENT_AMBULATORY_CARE_PROVIDER_SITE_OTHER): Payer: Medicare HMO | Admitting: Family Medicine

## 2018-11-09 ENCOUNTER — Other Ambulatory Visit: Payer: Self-pay

## 2018-11-09 ENCOUNTER — Telehealth: Payer: Self-pay

## 2018-11-09 ENCOUNTER — Encounter: Payer: Self-pay | Admitting: Family Medicine

## 2018-11-09 DIAGNOSIS — G8929 Other chronic pain: Secondary | ICD-10-CM

## 2018-11-09 DIAGNOSIS — M25512 Pain in left shoulder: Secondary | ICD-10-CM

## 2018-11-09 DIAGNOSIS — M25562 Pain in left knee: Secondary | ICD-10-CM

## 2018-11-09 DIAGNOSIS — M545 Low back pain: Secondary | ICD-10-CM

## 2018-11-09 DIAGNOSIS — M25561 Pain in right knee: Secondary | ICD-10-CM

## 2018-11-09 NOTE — Progress Notes (Signed)
Patient arrived late today and and unfortunately I had to leave for an appointment myself.  I will given an order MRI scan of her left shoulder and lumbar spine since she is still having troubles with these areas.  She will come back afterward to go over the results.

## 2018-11-09 NOTE — Telephone Encounter (Signed)
The patient is asking for MRIs of her left shoulder, hip and knee. The chiropractor is still working on her neck and back. It was helping for a while, but not any longer (next appointment is tomorrow). MRIs of the lower back and left shoulder have been ordered already. Will the lumbar MRI show enough of the hip? Is it possible to add the knee?

## 2018-11-10 ENCOUNTER — Telehealth: Payer: Self-pay | Admitting: Family Medicine

## 2018-11-10 NOTE — Telephone Encounter (Signed)
I left full message on the patient's home voice mail of the 3 MRIs that were ordered. The lumbar MRI will show what he needs to see on the hip.

## 2018-11-10 NOTE — Addendum Note (Signed)
Addended by: Hortencia Pilar on: 11/10/2018 08:50 AM   Modules accepted: Orders

## 2018-11-10 NOTE — Telephone Encounter (Signed)
Knee MRI ordered.  Lumbar MRI will show [place] what we need to see regarding her hip pain.

## 2018-11-10 NOTE — Telephone Encounter (Signed)
Patient called advised she need an MRI for her Left shoulder,left hip, Lower Back and Left knee. The number to contact patient is [phone] or [phone]

## 2018-11-10 NOTE — Telephone Encounter (Signed)
I called and spoke with the patient. I reiterated the message I left on her voice mail earlier this morning: Dr. Junius Roads ordered an MRI of her left shoulder, lumbar spine and the left knee. The lumbar MRI will show him what he needs to see of her left hip.

## 2018-12-04 ENCOUNTER — Ambulatory Visit
Admission: RE | Admit: 2018-12-04 | Discharge: 2018-12-04 | Disposition: A | Discharge status: Home / Self Care | Payer: Medicare HMO | Source: Ambulatory Visit | Attending: Family Medicine | Admitting: Family Medicine

## 2018-12-04 ENCOUNTER — Other Ambulatory Visit: Payer: Self-pay

## 2018-12-04 DIAGNOSIS — M25561 Pain in right knee: Secondary | ICD-10-CM

## 2018-12-04 DIAGNOSIS — M48061 Spinal stenosis, lumbar region without neurogenic claudication: Secondary | ICD-10-CM | POA: Diagnosis not present

## 2018-12-04 DIAGNOSIS — G8929 Other chronic pain: Secondary | ICD-10-CM

## 2018-12-04 DIAGNOSIS — M25562 Pain in left knee: Secondary | ICD-10-CM | POA: Diagnosis not present

## 2018-12-04 DIAGNOSIS — M75122 Complete rotator cuff tear or rupture of left shoulder, not specified as traumatic: Secondary | ICD-10-CM | POA: Diagnosis not present

## 2018-12-06 ENCOUNTER — Telehealth: Payer: Self-pay | Admitting: Family Medicine
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
## 2018-12-08 ENCOUNTER — Telehealth: Payer: Self-pay | Admitting: Orthopedic Surgery

## 2018-12-08 NOTE — Telephone Encounter (Signed)
Vanessa Beard would like someone to give her a call to go over the results of her MRI scan that was ordered by Dr. Junius Roads.

## 2018-12-08 NOTE — Telephone Encounter (Signed)
I called the patient. Dr. Junius Roads wrote out full results in MyChart for her. She pulled this up as we were speaking. I advised her to read through this and call or message [place] as to how she would like to proceed.

## 2018-12-13 ENCOUNTER — Ambulatory Visit
Admission: RE | Admit: 2018-12-13 | Discharge: 2018-12-13 | Disposition: A | Discharge status: Home / Self Care | Payer: Medicare HMO | Source: Ambulatory Visit | Attending: Internal Medicine | Admitting: Internal Medicine

## 2018-12-13 ENCOUNTER — Other Ambulatory Visit: Payer: Self-pay

## 2018-12-13 DIAGNOSIS — Z1231 Encounter for screening mammogram for malignant neoplasm of breast: Secondary | ICD-10-CM

## 2018-12-15 ENCOUNTER — Encounter: Payer: Self-pay | Admitting: Family Medicine

## 2018-12-15 ENCOUNTER — Other Ambulatory Visit: Payer: Self-pay

## 2018-12-15 ENCOUNTER — Ambulatory Visit (INDEPENDENT_AMBULATORY_CARE_PROVIDER_SITE_OTHER): Payer: Medicare HMO | Admitting: Family Medicine

## 2018-12-15 DIAGNOSIS — G8929 Other chronic pain: Secondary | ICD-10-CM | POA: Diagnosis not present

## 2018-12-15 DIAGNOSIS — M545 Low back pain, unspecified: Secondary | ICD-10-CM

## 2018-12-15 DIAGNOSIS — M25562 Pain in left knee: Secondary | ICD-10-CM | POA: Diagnosis not present

## 2018-12-15 DIAGNOSIS — M25512 Pain in left shoulder: Secondary | ICD-10-CM | POA: Diagnosis not present
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
## 2018-12-16 ENCOUNTER — Telehealth: Payer: Self-pay | Admitting: *Deleted

## 2018-12-17 ENCOUNTER — Other Ambulatory Visit: Payer: Self-pay | Admitting: Physical Medicine and Rehabilitation

## 2018-12-17 DIAGNOSIS — F411 Generalized anxiety disorder: Secondary | ICD-10-CM

## 2018-12-17 MED ORDER — DIAZEPAM 5 MG PO TABS: ORAL_TABLET | ORAL | 0 refills | Status: DC

## 2018-12-17 NOTE — Progress Notes (Signed)
Pre-procedure diazepam ordered for pre-operative anxiety.  

## 2018-12-17 NOTE — Telephone Encounter (Signed)
Done

## 2019-01-03 ENCOUNTER — Encounter: Payer: Self-pay | Admitting: Physical Medicine and Rehabilitation

## 2019-01-03 ENCOUNTER — Ambulatory Visit: Payer: Self-pay

## 2019-01-03 ENCOUNTER — Ambulatory Visit (INDEPENDENT_AMBULATORY_CARE_PROVIDER_SITE_OTHER): Payer: Medicare HMO | Admitting: Physical Medicine and Rehabilitation

## 2019-01-03 VITALS — BP 141/100 | HR 68

## 2019-01-03 DIAGNOSIS — M47816 Spondylosis without myelopathy or radiculopathy, lumbar region: Secondary | ICD-10-CM | POA: Diagnosis not present

## 2019-01-03 MED ORDER — METHYLPREDNISOLONE ACETATE 80 MG/ML IJ SUSP
80.0000 mg | Freq: Once | INTRAMUSCULAR | Status: AC
  Administered 2019-01-03: 80 mg

## 2019-01-03 NOTE — Progress Notes (Signed)
   Numeric Pain Rating Scale and Functional Assessment Average Pain 10   In the last MONTH (on 0-10 scale) has pain interfered with the following?  1. General activity like being  able to carry out your everyday physical activities such as walking, climbing stairs, carrying groceries, or moving a chair?  Rating(8)   +Driver, -BT, -Dye Allergies.  

## 2019-01-04 NOTE — Procedures (Signed)
Lumbar Facet Joint Intra-Articular Injection(s) with Fluoroscopic Guidance  Patient: Vanessa Beard      Date of Birth: 1951-02-08 MRN: [id number] PCP: Seward Carol, MD      Visit Date: 01/03/2019   Universal Protocol:    Date/Time: 01/03/2019  Consent Given By: the patient  Position: PRONE   Additional Comments: Vital signs were monitored before and after the procedure. Patient was prepped and draped in the usual sterile fashion. The correct patient, procedure, and site was verified.   Injection Procedure Details:  Procedure Site One Meds Administered:  Meds ordered this encounter  Medications  . methylPREDNISolone acetate (DEPO-MEDROL) injection 80 mg     Laterality: Bilateral  Location/Site:  L5-S1  Needle size: 22 guage  Needle type: Spinal  Needle Placement: Articular  Findings:  -Comments: Excellent flow of contrast producing a partial arthrogram.  Procedure Details: The fluoroscope beam is vertically oriented in AP, and the inferior recess is visualized beneath the lower pole of the inferior apophyseal process, which represents the target point for needle insertion. When direct visualization is difficult the target point is located at the medial projection of the vertebral pedicle. The region overlying each aforementioned target is locally anesthetized with a 1 to 2 ml. volume of 1% Lidocaine without Epinephrine.   The spinal needle was inserted into each of the above mentioned facet joints using biplanar fluoroscopic guidance. A 0.25 to 0.5 ml. volume of Isovue-250 was injected and a partial facet joint arthrogram was obtained. A single spot film was obtained of the resulting arthrogram.    One to 1.25 ml of the steroid/anesthetic solution was then injected into each of the facet joints noted above.   Additional Comments:  The patient tolerated the procedure well Dressing: 2 x 2 sterile gauze and Band-Aid    Post-procedure details: Patient was  observed during the procedure. Post-procedure instructions were reviewed.  Patient left the clinic in stable condition.
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
## 2019-01-12 ENCOUNTER — Telehealth: Payer: Self-pay | Admitting: Family Medicine

## 2019-01-12 DIAGNOSIS — M545 Low back pain, unspecified: Secondary | ICD-10-CM

## 2019-01-12 DIAGNOSIS — G8929 Other chronic pain: Secondary | ICD-10-CM

## 2019-01-12 NOTE — Telephone Encounter (Signed)
Patient called left voicemail message asking for a call back. Patient said she is still having the same pain as before. The number to contact patient is [phone]

## 2019-01-12 NOTE — Telephone Encounter (Signed)
The patient was calling back to give a progress report on her left lower back and left hip pain. She had maybe 1 day of relief after the injections by Dr. Ernestina Patches. The pain is back to where it was before. She is taking Ibuprofen 800 mg for the pain, but even it is not helping much. She does not want to take a lot of medication - just wants to know what is the next step?

## 2019-01-13 NOTE — Telephone Encounter (Signed)
Could try another injection if she wants (sometimes a second injection gives longer relief).  Alternatively, could get the opinion of a Psychologist, sport and exercise.

## 2019-01-13 NOTE — Telephone Encounter (Signed)
Referral order placed.

## 2019-01-13 NOTE — Telephone Encounter (Signed)
I called the patient. She is choosing to try another steroid injection, in hopes it will work better this time.

## 2019-01-20 ENCOUNTER — Other Ambulatory Visit: Payer: Self-pay | Admitting: Family

## 2019-01-20 DIAGNOSIS — B20 Human immunodeficiency virus [HIV] disease: Secondary | ICD-10-CM

## 2019-01-25 ENCOUNTER — Ambulatory Visit: Payer: Medicare HMO | Admitting: Internal Medicine

## 2019-02-02 ENCOUNTER — Encounter: Payer: Self-pay | Admitting: Physical Medicine and Rehabilitation

## 2019-02-02 ENCOUNTER — Ambulatory Visit: Payer: Self-pay

## 2019-02-02 ENCOUNTER — Ambulatory Visit (INDEPENDENT_AMBULATORY_CARE_PROVIDER_SITE_OTHER): Payer: Medicare HMO | Admitting: Physical Medicine and Rehabilitation

## 2019-02-02 VITALS — BP 149/104 | HR 66

## 2019-02-02 DIAGNOSIS — M48062 Spinal stenosis, lumbar region with neurogenic claudication: Secondary | ICD-10-CM

## 2019-02-02 MED ORDER — METHYLPREDNISOLONE ACETATE 80 MG/ML IJ SUSP
80.0000 mg | Freq: Once | INTRAMUSCULAR | Status: AC
  Administered 2019-02-02: 80 mg

## 2019-02-02 NOTE — Progress Notes (Signed)
 .  Numeric Pain Rating Scale and Functional Assessment Average Pain 8   In the last MONTH (on 0-10 scale) has pain interfered with the following?  1. General activity like being  able to carry out your everyday physical activities such as walking, climbing stairs, carrying groceries, or moving a chair?  Rating(7)   +Driver, -BT, -Dye Allergies.  

## 2019-02-07 ENCOUNTER — Ambulatory Visit (INDEPENDENT_AMBULATORY_CARE_PROVIDER_SITE_OTHER): Payer: Medicare HMO | Admitting: Internal Medicine

## 2019-02-07 ENCOUNTER — Other Ambulatory Visit: Payer: Self-pay

## 2019-02-07 DIAGNOSIS — Z23 Encounter for immunization: Secondary | ICD-10-CM | POA: Diagnosis not present

## 2019-02-07 DIAGNOSIS — B20 Human immunodeficiency virus [HIV] disease: Secondary | ICD-10-CM

## 2019-02-07 DIAGNOSIS — Z79899 Other long term (current) drug therapy: Secondary | ICD-10-CM | POA: Diagnosis not present

## 2019-02-07 DIAGNOSIS — Z8742 Personal history of other diseases of the female genital tract: Secondary | ICD-10-CM

## 2019-02-07 DIAGNOSIS — R69 Illness, unspecified: Secondary | ICD-10-CM | POA: Diagnosis not present

## 2019-02-07 NOTE — Progress Notes (Signed)
Patient ID: Vanessa Beard, female   DOB: Aug 31, 1950, 68 y.o.   MRN: [id number]  HPI [name] is a 68yo F with well controlled HIV disease, HTN, CD 4 count of 213/VL<20 on triumeq. Doing well but has ongoing arthritis. She did have epidural injection < 2 weeks but not sure if she has noticed much improvement ( has not had more than 2 in the last 12 months). Has some discomfort at the back injection site- combination of OA,spinal stenosis.  Continues to work as Quarry manager, has been in good health. Not been exposed to COVID-19.   Still finishing out school classes that she takes on line.  Outpatient Encounter Medications as of 02/07/2019  Medication Sig  . amLODipine (NORVASC) 10 MG tablet Take 1 tablet (10 mg total) by mouth daily.  . diazepam (VALIUM) 5 MG tablet Take 1 by mouth 1 hour  pre-procedure with very light food. May bring 2nd tablet to appointment.  Marland Kitchen ibuprofen (ADVIL,MOTRIN) 800 MG tablet TAKE 1 TABLET(800 MG) BY MOUTH EVERY 8 HOURS AS NEEDED  . Multiple Vitamins-Minerals (ADULT GUMMY) CHEW Chew 3 capsules by mouth daily.  . TRIUMEQ 600-50-300 MG tablet TAKE 1 TABLET BY MOUTH DAILY  . TURMERIC PO Take by mouth daily.  Marland Kitchen zolpidem (AMBIEN) 10 MG tablet Take 1 tablet (10 mg total) by mouth at bedtime as needed.   No facility-administered encounter medications on file as of 02/07/2019.      Patient Active Problem List   Diagnosis Date Noted  . Healthcare maintenance 06/30/2018  . Left knee pain 12/30/2017  . Left shoulder pain 12/30/2017  . Closed fracture of right tibial plateau 02/18/2017  . Benign hypertension 02/15/2017  . Screening examination for venereal disease 03/06/2015  . Encounter for long-term (current) use of medications 03/06/2015  . Elevated LFTs 12/11/2014  . Abnormal ultrasound 12/11/2014  . Abdominal pain, epigastric 12/11/2014  . ASCUS with positive high risk HPV 10/31/2013  . Gall bladder disease 02/03/2013  . MGUS (monoclonal gammopathy of unknown  significance) 07/07/2012  . Compression fracture of L3 lumbar vertebra 07/01/2011  . Thyroid dysfunction 07/01/2011  . Anemia 07/01/2011  . Sore throat 04/02/2011  . Hyperparathyroidism 02/10/2011  . Osteopenia 02/03/2011  . PAP SMEAR, LGSIL, ABNORMAL 12/21/2009  . COUGH 07/18/2009  . Chronic hepatitis C virus infection ([place]) 12/19/2008  . LEUKORRHEA 04/07/2008  . FATIGUE 04/07/2008  . BACK PAIN, LUMBAR 11/10/2007  . ALLERGIC RHINITIS 07/09/2007  . PNEUMOCYSTIS PNEUMONIA 05/07/2007  . HYPERTENSION 05/07/2007  . DENTAL CARIES 05/07/2007  . INSOMNIA 05/07/2007  . PNEUMONIA, HX OF 05/07/2007  . HERPES ZOSTER, HX OF 05/07/2007  . Human immunodeficiency virus (HIV) disease ([place]) 08/04/2006     Health Maintenance Due  Topic Date Due  . PAP SMEAR-Modifier  11/28/2017  . PNA vac Low Risk Adult (2 of 2 - PPSV23) 11/17/2018  . INFLUENZA VACCINE  12/18/2018    Social History   Tobacco Use  . Smoking status: Never Smoker  . Smokeless tobacco: Never Used  Substance Use Topics  . Alcohol use: Yes    Alcohol/week: 3.0 standard drinks    Types: 3 Standard drinks or equivalent per week    Comment: wine or [place] alcohol.  Occasional  . Drug use: No   Review of Systems  Constitutional: Negative for fever, chills, diaphoresis, activity change, appetite change, fatigue and unexpected weight change.  HENT: Negative for congestion, sore throat, rhinorrhea, sneezing, trouble swallowing and sinus pressure.  Eyes: Negative for photophobia and  visual disturbance.  Respiratory: Negative for cough, chest tightness, shortness of breath, wheezing and stridor.  Cardiovascular: Negative for chest pain, palpitations and leg swelling.  Gastrointestinal: Negative for nausea, vomiting, abdominal pain, diarrhea, constipation, blood in stool, abdominal distention and anal bleeding.  Genitourinary: Negative for dysuria, hematuria, flank pain and difficulty urinating.  Musculoskeletal: +arthritis to major  joints, and back pain Skin: Negative for color change, pallor, rash and wound.  Neurological: Negative for dizziness, tremors, weakness and light-headedness.  Hematological: Negative for adenopathy. Does not bruise/bleed easily.  Psychiatric/Behavioral: Negative for behavioral problems, confusion, sleep disturbance, dysphoric mood, decreased concentration and agitation.    Physical Exam   There were no vitals taken for this visit. Physical Exam  Constitutional:  oriented to person, place, and time. appears well-developed and well-nourished. No distress.  HENT: [place]/AT, PERRLA, no scleral icterus Mouth/Throat: Oropharynx is clear and moist. No oropharyngeal exudate.  Cardiovascular: Normal rate, regular rhythm and normal heart sounds. Exam reveals no gallop and no friction rub.  No murmur heard.  Pulmonary/Chest: Effort normal and breath sounds normal. No respiratory distress.  has no wheezes.  Neck = supple, no nuchal rigidity Abdominal: Soft. Bowel sounds are normal.  exhibits no distension. There is no tenderness.  Lymphadenopathy: no cervical adenopathy. No axillary adenopathy Neurological: alert and oriented to person, place, and time.  Skin: Skin is warm and dry. No rash noted. No erythema.  Psychiatric: a normal mood and affect.  behavior is normal.    Lab Results  Component Value Date   CD4TCELL 20 (L) 10/04/2018   Lab Results  Component Value Date   CD4TABS 213 (L) 10/04/2018   CD4TABS 300 (L) 06/30/2018   CD4TABS 190 (L) 02/24/2018   Lab Results  Component Value Date   HIV1RNAQUANT <20 NOT DETECTED 10/04/2018   Lab Results  Component Value Date   HEPBSAB NEG 12/29/2013   Lab Results  Component Value Date   LABRPR NON-REACTIVE 10/04/2018    CBC Lab Results  Component Value Date   WBC 4.8 10/04/2018   RBC 4.30 10/04/2018   HGB 13.0 10/04/2018   HCT 37.9 10/04/2018   PLT 180 10/04/2018   MCV 88.1 10/04/2018   MCH 30.2 10/04/2018   MCHC 34.3 10/04/2018    RDW 13.1 10/04/2018   LYMPHSABS 1,201 02/24/2018   MONOABS 0.3 02/15/2017   EOSABS 79 02/24/2018    BMET Lab Results  Component Value Date   NA 141 10/04/2018   K 3.7 10/04/2018   CL 108 10/04/2018   CO2 25 10/04/2018   GLUCOSE 88 10/04/2018   BUN 21 10/04/2018   CREATININE 0.98 10/04/2018   CALCIUM 9.7 10/04/2018   GFRNONAA 62 06/30/2018   GFRAA 72 06/30/2018      Assessment and Plan  hiv disease= well controlled in Feb 2020, will need to check labs. Plan on continuing triumeq  Health maintenance = will give High dose flu  Hx of abn pap =  Pap since hx of ascus in 2018  Long term medication management = cr is stable. Will check cr

## 2019-02-09 ENCOUNTER — Other Ambulatory Visit (INDEPENDENT_AMBULATORY_CARE_PROVIDER_SITE_OTHER): Payer: Self-pay | Admitting: Orthopaedic Surgery

## 2019-02-09 LAB — HELPER T-LYMPH-CD4 (ARMC ONLY)
% CD 4 Pos. Lymph.: 25.7 % — ABNORMAL LOW (ref 30.8–58.5)
Absolute CD 4 Helper: 386 /uL (ref 359–1519)
Basophils Absolute: 0 10*3/uL (ref 0.0–0.2)
Basos: 0 %
EOS (ABSOLUTE): 0.1 10*3/uL (ref 0.0–0.4)
Eos: 1 %
Hematocrit: 37.8 % (ref 34.0–46.6)
Hemoglobin: 12.5 g/dL (ref 11.1–15.9)
Immature Grans (Abs): 0 10*3/uL (ref 0.0–0.1)
Immature Granulocytes: 0 %
Lymphocytes Absolute: 1.5 10*3/uL (ref 0.7–3.1)
Lymphs: 21 %
MCH: 29.8 pg (ref 26.6–33.0)
MCHC: 33.1 g/dL (ref 31.5–35.7)
MCV: 90 fL (ref 79–97)
Monocytes Absolute: 0.5 10*3/uL (ref 0.1–0.9)
Monocytes: 6 %
Neutrophils Absolute: 5.1 10*3/uL (ref 1.4–7.0)
Neutrophils: 72 %
Platelets: 191 10*3/uL (ref 150–450)
RBC: 4.19 x10E6/uL (ref 3.77–5.28)
RDW: 14 % (ref 11.7–15.4)
WBC: 7.1 10*3/uL (ref 3.4–10.8)

## 2019-02-09 NOTE — Telephone Encounter (Signed)
Ok to refill 

## 2019-02-09 NOTE — Telephone Encounter (Signed)
Ok

## 2019-02-10 NOTE — Telephone Encounter (Signed)
Sure

## 2019-02-11 LAB — COMPLETE METABOLIC PANEL WITH GFR
AG Ratio: 1.2 (calc) (ref 1.0–2.5)
ALT: 13 U/L (ref 6–29)
AST: 18 U/L (ref 10–35)
Albumin: 3.8 g/dL (ref 3.6–5.1)
Alkaline phosphatase (APISO): 68 U/L (ref 37–153)
BUN/Creatinine Ratio: 20 (calc) (ref 6–22)
BUN: 21 mg/dL (ref 7–25)
CO2: 27 mmol/L (ref 20–32)
Calcium: 9.5 mg/dL (ref 8.6–10.4)
Chloride: 107 mmol/L (ref 98–110)
Creat: 1.04 mg/dL — ABNORMAL HIGH (ref 0.50–0.99)
GFR, Est African American: 63 mL/min/{1.73_m2} (ref >=60)
GFR, Est Non African American: 55 mL/min/{1.73_m2} — ABNORMAL LOW (ref >=60)
Globulin: 3.2 g/dL (calc) (ref 1.9–3.7)
Glucose, Bld: 109 mg/dL — ABNORMAL HIGH (ref 65–99)
Potassium: 3.4 mmol/L — ABNORMAL LOW (ref 3.5–5.3)
Sodium: 143 mmol/L (ref 135–146)
Total Bilirubin: 0.4 mg/dL (ref 0.2–1.2)
Total Protein: 7 g/dL (ref 6.1–8.1)

## 2019-02-11 LAB — CBC WITH DIFFERENTIAL/PLATELET
Absolute Monocytes: 518 cells/uL (ref 200–950)
Basophils Absolute: 28 cells/uL (ref 0–200)
Basophils Relative: 0.4 %
Eosinophils Absolute: 77 cells/uL (ref 15–500)
Eosinophils Relative: 1.1 %
HCT: 36.8 % (ref 35.0–45.0)
Hemoglobin: 12.4 g/dL (ref 11.7–15.5)
Lymphs Abs: 1351 cells/uL (ref 850–3900)
MCH: 30 pg (ref 27.0–33.0)
MCHC: 33.7 g/dL (ref 32.0–36.0)
MCV: 88.9 fL (ref 80.0–100.0)
MPV: 12 fL (ref 7.5–12.5)
Monocytes Relative: 7.4 %
Neutro Abs: 5026 cells/uL (ref 1500–7800)
Neutrophils Relative %: 71.8 %
Platelets: 199 10*3/uL (ref 140–400)
RBC: 4.14 10*6/uL (ref 3.80–5.10)
RDW: 13.9 % (ref 11.0–15.0)
Total Lymphocyte: 19.3 %
WBC: 7 10*3/uL (ref 3.8–10.8)

## 2019-02-11 LAB — HIV-1 RNA QUANT-NO REFLEX-BLD
HIV 1 RNA Quant: <20 copies/mL
HIV-1 RNA Quant, Log: <1.3 Log copies/mL

## 2019-02-21 NOTE — Procedures (Signed)
Lumbar Epidural Steroid Injection - Interlaminar Approach with Fluoroscopic Guidance  Patient: Vanessa Beard      Date of Birth: 1950/12/01 MRN: [id number] PCP: Seward Carol, MD      Visit Date: 02/02/2019   Universal Protocol:     Consent Given By: the patient  Position: PRONE  Additional Comments: Vital signs were monitored before and after the procedure. Patient was prepped and draped in the usual sterile fashion. The correct patient, procedure, and site was verified.   Injection Procedure Details:  Procedure Site One Meds Administered:  Meds ordered this encounter  Medications  . methylPREDNISolone acetate (DEPO-MEDROL) injection 80 mg     Laterality: Right  Location/Site:  L4-L5  Needle size: 20 G  Needle type: Tuohy  Needle Placement: Paramedian epidural  Findings:   -Comments: Excellent flow of contrast into the epidural space.  Procedure Details: Using a paramedian approach from the side mentioned above, the region overlying the inferior lamina was localized under fluoroscopic visualization and the soft tissues overlying this structure were infiltrated with 4 ml. of 1% Lidocaine without Epinephrine. The Tuohy needle was inserted into the epidural space using a paramedian approach.   The epidural space was localized using loss of resistance along with lateral and bi-planar fluoroscopic views.  After negative aspirate for air, blood, and CSF, a 2 ml. volume of Isovue-250 was injected into the epidural space and the flow of contrast was observed. Radiographs were obtained for documentation purposes.    The injectate was administered into the level noted above.   Additional Comments:  The patient tolerated the procedure well Dressing: 2 x 2 sterile gauze and Band-Aid    Post-procedure details: Patient was observed during the procedure. Post-procedure instructions were reviewed.  Patient left the clinic in stable condition.

## 2019-02-21 NOTE — Progress Notes (Signed)
Vanessa Beard - 68 y.o. female MRN [id number]  Date of birth: 10/14/1950  Office Visit Note: Visit Date: 02/02/2019 PCP: Seward Carol, MD Referred by: Seward Carol, MD  Subjective: Chief Complaint  Patient presents with  . Lower Back - Pain   HPI:  Vanessa Beard is a 68 y.o. female who comes in today Through with Dr. Legrand Como hilts for lumbar epidural injection.  Patient was seen in August and completed bilateral facet/medial branch blocks at L5-S1 with decent relief but just for a few days.  She reports pain in the low back worse with walking for long distance and it makes her have to sit down.  She uses ibuprofen for pain relief.  I think the diagnostic blocks probably were somewhat successful but patient is a little bit of a poor historian.  She does have moderate narrowing at L3-4 do 2 prior compression fracture and mild retropulsion.  We will try L4-5 interlaminar injection for the stenosis.  If she gets a great deal of relief that lasted obviously these can be repeated on an infrequent basis.  If she does not get much relief at all and I would look at one more diagnostic block at L5-S1 of the facet joints and consider radiofrequency ablation.  She does have pain on standing and facet loading.  She is failed conservative care otherwise.  ROS Otherwise per HPI.  Assessment & Plan: Visit Diagnoses:  1. Spinal stenosis of lumbar region with neurogenic claudication     Plan: No additional findings.   Meds & Orders:  Meds ordered this encounter  Medications  . methylPREDNISolone acetate (DEPO-MEDROL) injection 80 mg    Orders Placed This Encounter  Procedures  . XR C-ARM NO REPORT  . Epidural Steroid injection    Follow-up: No follow-ups on file.   Procedures: No procedures performed  Lumbar Epidural Steroid Injection - Interlaminar Approach with Fluoroscopic Guidance  Patient: Vanessa Beard      Date of Birth: Apr 30, 1951 MRN: [id number] PCP: Seward Carol, MD       Visit Date: 02/02/2019   Universal Protocol:     Consent Given By: the patient  Position: PRONE  Additional Comments: Vital signs were monitored before and after the procedure. Patient was prepped and draped in the usual sterile fashion. The correct patient, procedure, and site was verified.   Injection Procedure Details:  Procedure Site One Meds Administered:  Meds ordered this encounter  Medications  . methylPREDNISolone acetate (DEPO-MEDROL) injection 80 mg     Laterality: Right  Location/Site:  L4-L5  Needle size: 20 G  Needle type: Tuohy  Needle Placement: Paramedian epidural  Findings:   -Comments: Excellent flow of contrast into the epidural space.  Procedure Details: Using a paramedian approach from the side mentioned above, the region overlying the inferior lamina was localized under fluoroscopic visualization and the soft tissues overlying this structure were infiltrated with 4 ml. of 1% Lidocaine without Epinephrine. The Tuohy needle was inserted into the epidural space using a paramedian approach.   The epidural space was localized using loss of resistance along with lateral and bi-planar fluoroscopic views.  After negative aspirate for air, blood, and CSF, a 2 ml. volume of Isovue-250 was injected into the epidural space and the flow of contrast was observed. Radiographs were obtained for documentation purposes.    The injectate was administered into the level noted above.   Additional Comments:  The patient tolerated the procedure well Dressing: 2 x 2 sterile gauze  and Band-Aid    Post-procedure details: Patient was observed during the procedure. Post-procedure instructions were reviewed.  Patient left the clinic in stable condition.   Clinical History: MRI LUMBAR SPINE WITHOUT CONTRAST  TECHNIQUE: Multiplanar, multisequence MR imaging of the lumbar spine was performed. No intravenous contrast was administered.  COMPARISON:  None  available.  FINDINGS: Segmentation: Standard. Lowest well-formed disc labeled the L5-S1 level.  Alignment: Trace levoscoliosis. Vertebral bodies otherwise normally aligned with preservation of the normal lumbar lordosis. No listhesis.  Vertebrae: Chronic compression deformity involving the T12 vertebral body with up to 40% height loss and trace 3 mm bony retropulsion. Mild compression deformity involving the right aspect of the superior endplate of L3 with up to 30% height loss without significant bony retropulsion. Additional chronic compression deformity involving the superior endplate of L4 with up to 35% height loss and 3 mm bony retropulsion. Superimposed endplate Schmorl's node deformities noted about these chronic fractures. These are benign/mechanical in appearance. Vertebral body height otherwise maintained. No evidence for acute or subacute fracture. Underlying bone marrow signal intensity within normal limits. No discrete or worrisome osseous lesions. Mild reactive edema about the bilateral L5-S1 facets due to facet arthritis (series 4, image 4, 12). No other abnormal marrow edema.  Conus medullaris and cauda equina: Conus extends to the L2 level. Conus and cauda equina appear normal.  Paraspinal and other soft tissues: Paraspinous soft tissues within normal limits. Visualized visceral structures unremarkable. Scattered atherosclerotic change noted within the intra-abdominal aorta.  Disc levels:  T10-11: Unremarkable.  T11-12: Mild disc bulge. 3 mm bony retropulsion related to the chronic T12 compression fracture. No significant stenosis.  T12-L1: Unremarkable.  L1-2: Mild disc bulge. Superimposed tiny left central disc extrusion minimally indents the left ventral thecal sac (series 7, image 9). No significant canal or foraminal stenosis.  L2-3: Mild annular disc bulge. Mild facet and ligament flavum hypertrophy. No significant canal or foraminal  stenosis. No impingement.  L3-4: Diffuse disc bulge with an disc desiccation and mild intervertebral disc space narrowing. 3 mm bony retropulsion related to the chronic L4 compression fracture. Moderate facet and ligament flavum hypertrophy. Resultant moderate canal stenosis. Mild bilateral L3 foraminal narrowing.  L4-5: Mild disc bulge, slightly asymmetric to the left. Mild facet and ligament flavum hypertrophy. Resultant borderline mild spinal stenosis. Mild bilateral L4 foraminal narrowing.  L5-S1: Mild diffuse disc bulge with disc desiccation. Disc bulging slightly asymmetric to the left, with superimposed tiny central disc protrusion (series 7, image 31). Advanced facet and ligament flavum hypertrophy with associated small bilateral joint effusions and reactive marrow edema. Mild epidural lipomatosis. Resultant mild spinal stenosis. Moderate bilateral L5 foraminal narrowing.  IMPRESSION: 1. Chronic compression fractures involving the T12, L3, and L4 vertebral bodies as detailed above. No acute or subacute injury within the lumbar spine. 2. Multifactorial degenerative changes at L3-4 with resultant moderate canal with mild bilateral L3 foraminal stenosis. 3. Moderate bilateral L5 foraminal stenosis related to disc bulge and facet hypertrophy, potentially affecting either of the exiting L5 nerve roots. 4. Prominent facet arthrosis at L5-S1 with associated reactive marrow edema. Finding could contribute to underlying low back pain.   Electronically Signed   By: Jeannine Boga M.D.   On: 12/04/2018 22:50     Objective:  VS:  HT:    WT:   BMI:     BP:(!) 149/104  HR:66bpm  TEMP: ( )  RESP:  Physical Exam  Ortho Exam Imaging: No results found.

## 2019-02-28 ENCOUNTER — Telehealth: Payer: Self-pay | Admitting: Physical Medicine and Rehabilitation

## 2019-03-02 ENCOUNTER — Ambulatory Visit: Payer: Medicare HMO | Admitting: Infectious Diseases

## 2019-03-02 NOTE — Telephone Encounter (Signed)
Left message #1

## 2019-03-02 NOTE — Telephone Encounter (Signed)
She has problems both at L3-4 and L5-S1.  First injection was facet joint blocks which only helped a very small amount.  These were at L5-S1.  She then had transforaminal epidural injection I believe L4.  My recommendation would be L5 transforaminal on the side that hurts her hip versus follow-up with Dr. Legrand Como hilts.

## 2019-03-03 ENCOUNTER — Other Ambulatory Visit (HOSPITAL_COMMUNITY)
Admission: RE | Admit: 2019-03-03 | Discharge: 2019-03-03 | Disposition: A | Discharge status: Home / Self Care | Payer: Medicare HMO | Source: Ambulatory Visit | Attending: Infectious Diseases | Admitting: Infectious Diseases

## 2019-03-03 ENCOUNTER — Encounter: Payer: Self-pay | Admitting: Infectious Diseases

## 2019-03-03 ENCOUNTER — Ambulatory Visit (INDEPENDENT_AMBULATORY_CARE_PROVIDER_SITE_OTHER): Payer: Medicare HMO | Admitting: Infectious Diseases

## 2019-03-03 ENCOUNTER — Other Ambulatory Visit: Payer: Self-pay

## 2019-03-03 DIAGNOSIS — Z124 Encounter for screening for malignant neoplasm of cervix: Secondary | ICD-10-CM

## 2019-03-03 DIAGNOSIS — Z1151 Encounter for screening for human papillomavirus (HPV): Secondary | ICD-10-CM | POA: Insufficient documentation

## 2019-03-03 DIAGNOSIS — Z780 Asymptomatic menopausal state: Secondary | ICD-10-CM | POA: Insufficient documentation

## 2019-03-03 DIAGNOSIS — B20 Human immunodeficiency virus [HIV] disease: Secondary | ICD-10-CM

## 2019-03-03 DIAGNOSIS — R69 Illness, unspecified: Secondary | ICD-10-CM | POA: Diagnosis not present

## 2019-03-03 DIAGNOSIS — R87610 Atypical squamous cells of undetermined significance on cytologic smear of cervix (ASC-US): Secondary | ICD-10-CM

## 2019-03-03 NOTE — Progress Notes (Signed)
      Subjective:    Vanessa Beard is a 68 y.o. female here for an annual pelvic exam and pap smear.   Review of Systems: Current GYN complaints or concerns: none. History of some abnormal paps but they clear on their own.  Patient denies any abdominal/pelvic pain, problems with bowel movements, urination, vaginal discharge or intercourse.   Past Medical History:  Diagnosis Date  . Allergic rhinitis   . Biliary colic   . Gall bladder disease \  . Hepatitis C   . Herpes   . HIV infection ([place])   . Hyperparathyroidism ([place])   . Hypertension   . Insomnia   . Osteoporosis   . Pneumonia    2010    Gynecologic History: [id number]  No LMP recorded. Patient is postmenopausal. Contraception: post menopausal status Last Pap: 07-2016. Results were: abnormal ASCUS, HPV - Anal Intercourse: no Last Mammogram: 11/2018. Results were: normal  Objective:  Physical Exam  Constitutional: Well developed, well nourished, no acute distress. She is alert and oriented x3.  Pelvic: External genitalia is normal in appearance. The vagina is normal in appearance. The cervix is bulbous and easily visualized. No CMT, normal expected cervical mucus present. Bimanual exam reveals uterus that is felt to be normal size, shape, and contour. No adnexal masses or tenderness noted. Breasts: symmetrical in contour, shape and texture. No palpable masses/nodules. No nipple discharge.  Psych: She has a normal mood and affect.    Assessment:   Problem List Items Addressed This Visit      Unprioritized   Human immunodeficiency virus (HIV) disease ([place])    Under excellent control on Triumeq. She will continue follow up with Terri Piedra, NP as currently scheduled.   Contraception = s/p menopause  STI prevention = abstinence  Mammogram = scheduled for 2 year follow up with normal test July 2020      ASCUS of cervix with negative high risk HPV    History of ASCUS with -HPV infection.  Normal pelvic and  bimanual exam with findings of atrophic vaginal walls/cervix.  Return in 1 year for annual pap screening unless indicated sooner. Discussed recommended screening interval for women living with HIV disease.        Other Visit Diagnoses    Screening for cervical cancer    -  Primary   Relevant Orders   Urine cytology ancillary only   Cytology - PAP( [place])      Janene Madeira, MSN, NP-C [facility] [place] Office: [phone] Pager: [phone]  03/03/19 4:32 PM

## 2019-03-03 NOTE — Telephone Encounter (Signed)
Patient states that injections are not helping. Scheduled for follow up with Dr. Junius Roads.

## 2019-03-03 NOTE — Assessment & Plan Note (Addendum)
Under excellent control on Triumeq. She will continue follow up with Terri Piedra, NP as currently scheduled.   Contraception = s/p menopause  STI prevention = abstinence  Mammogram = scheduled for 2 year follow up with normal test July 2020

## 2019-03-03 NOTE — Assessment & Plan Note (Signed)
History of ASCUS with -HPV infection.  Normal pelvic and bimanual exam with findings of atrophic vaginal walls/cervix.  Return in 1 year for annual pap screening unless indicated sooner. Discussed recommended screening interval for women living with HIV disease.

## 2019-03-07 ENCOUNTER — Other Ambulatory Visit: Payer: Self-pay

## 2019-03-07 ENCOUNTER — Ambulatory Visit (INDEPENDENT_AMBULATORY_CARE_PROVIDER_SITE_OTHER): Payer: Medicare HMO | Admitting: Family Medicine

## 2019-03-07 ENCOUNTER — Telehealth: Payer: Self-pay | Admitting: Family Medicine

## 2019-03-07 ENCOUNTER — Encounter: Payer: Self-pay | Admitting: Family Medicine

## 2019-03-07 DIAGNOSIS — M25562 Pain in left knee: Secondary | ICD-10-CM | POA: Diagnosis not present

## 2019-03-07 DIAGNOSIS — M25512 Pain in left shoulder: Secondary | ICD-10-CM | POA: Diagnosis not present

## 2019-03-07 DIAGNOSIS — M545 Low back pain, unspecified: Secondary | ICD-10-CM

## 2019-03-07 DIAGNOSIS — G8929 Other chronic pain: Secondary | ICD-10-CM

## 2019-03-07 MED ORDER — NABUMETONE 750 MG PO TABS: 750.0000 mg | ORAL_TABLET | Freq: Two times a day (BID) | ORAL | 6 refills | Status: DC | PRN

## 2019-03-07 MED ORDER — BACLOFEN 10 MG PO TABS: 5.0000 mg | ORAL_TABLET | Freq: Every evening | ORAL | 1 refills | Status: DC | PRN
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
## 2019-03-07 NOTE — Telephone Encounter (Signed)
Received vm from [place] w/ Vanessa Beard office checkng status of request for records. IC her back [phone]. lmvm advised we have not received request. I left our fax number for her to fax their request.

## 2019-03-08 LAB — URINE CYTOLOGY ANCILLARY ONLY
Chlamydia: NEGATIVE
Comment: NEGATIVE
Comment: NORMAL
Neisseria Gonorrhea: NEGATIVE

## 2019-03-10 LAB — CYTOLOGY - PAP
Adequacy: ABSENT
Comment: NEGATIVE
Diagnosis: NEGATIVE
High risk HPV: NEGATIVE

## 2019-03-11 NOTE — Progress Notes (Signed)
Normal pap smear. Recommend to repeat in 3 years. She did have ASCUS with - HPV in 2018 that is part of the 3 year follow up algorithm. Results communicated via MyChart.

## 2019-03-31 ENCOUNTER — Other Ambulatory Visit: Payer: Self-pay | Admitting: Internal Medicine

## 2019-03-31 DIAGNOSIS — B20 Human immunodeficiency virus [HIV] disease: Secondary | ICD-10-CM

## 2019-04-05 ENCOUNTER — Other Ambulatory Visit: Payer: Self-pay

## 2019-04-05 ENCOUNTER — Ambulatory Visit (INDEPENDENT_AMBULATORY_CARE_PROVIDER_SITE_OTHER): Payer: Medicare HMO | Admitting: Family Medicine

## 2019-04-05 ENCOUNTER — Encounter: Payer: Self-pay | Admitting: Family Medicine

## 2019-04-05 DIAGNOSIS — G8929 Other chronic pain: Secondary | ICD-10-CM | POA: Diagnosis not present

## 2019-04-05 DIAGNOSIS — M25512 Pain in left shoulder: Secondary | ICD-10-CM

## 2019-04-05 DIAGNOSIS — M533 Sacrococcygeal disorders, not elsewhere classified: Secondary | ICD-10-CM

## 2019-04-05 NOTE — Progress Notes (Signed)
I saw and examined the patient with Dr. [name] Masker and agree with assessment and plan as outlined.    Recurrent left shoulder impingement symptoms.  Good relief after previous injection, will repeat today.  Left SI dysfunction on exam.  Will treat with injection today.  After sterile prep, injected 5 cc 1% lidocaine without epi and 40 mg methylprednisone into the SI joint by palpation.  Modest improvement during anesthetic phase.  If not long-lasting relief, will consider image-guided injection.

## 2019-04-05 NOTE — Progress Notes (Signed)
Vanessa Beard - 68 y.o. female MRN [id number]  Date of birth: 02-13-1951  Office Visit Note: Visit Date: 04/05/2019 PCP: Seward Carol, MD Referred by: Seward Carol, MD  Subjective: Chief Complaint  Patient presents with  . Left Shoulder - Pain    Continues to have pain in the shoulder/upper arm and the hip is "still not working like it should." Still going to PT.  Marland Kitchen Left Hip - Pain    Patient is asking if an injection in the hip would help.   HPI: Vanessa Beard is a 68 y.o. female who comes in today for follow u of chronic left shoulder pain as well as left hip pain.  Shoulder- limited ROM, pain in joint- pain with reaching overhead.. She had subacromial injection 12/15/18 and had relief of pain until last week. She has been doing PT but has been limited due to pain.   Left hip- pain in lower back (points to SI joint)- as well as lateral hip, hurts to walk. Worse when transitioning from sitting to standing or twisting side to side. No pain in groin. No numbness/tingling down legs. She has had epidural spinal injections in lumbar spine in the past with minimal improvement. Requesting an injection today.     ROS Otherwise per HPI.  Assessment & Plan: Visit Diagnoses:  1. Chronic left shoulder pain   2. Sacroiliac joint dysfunction of left side     Plan:  Left shoulder pain- subacromial corticosteroid injection with relief. Continue PT SI joint dysfunction, left sided - SI joint corticosteroid injection   Meds & Orders: No orders of the defined types were placed in this encounter.  No orders of the defined types were placed in this encounter.   Follow-up: No follow-ups on file.   Procedures: Procedure performed: subacromial corticosteroid injection; palpation guided  Consent obtained and verified. Time-out conducted. Noted no overlying erythema, induration, or other signs of local infection. The left posterior subacromial space was palpated and marked. The  overlying skin was prepped in a sterile fashion. Topical analgesic spray: Ethyl chloride. Joint: left subacromial Needle: 25 gauge, 1.5 inch  Completed without difficulty. Meds: 4 cc 1% lidocaine, 40 mg methylprednisolone  SI joint injection - 5 cc 1% lidocaine, 40 mg methylprenisolone into joint by palpation  Advised to call if fevers/chills, erythema, induration, drainage, or persistent bleeding.   Clinical History: No specialty comments available.   She reports that she has never smoked. She has never used smokeless tobacco. No results for input(s): HGBA1C, LABURIC in the last 8760 hours.  Objective:  VS:  HT:    WT:   BMI:     BP:   HR: bpm  TEMP: ( )  RESP:  Physical Exam  PHYSICAL EXAM: Gen: NAD, alert, cooperative with exam, well-appearing HEENT: clear conjunctiva,  CV:  no edema, capillary refill brisk, normal rate Resp: non-labored Skin: no rashes, normal turgor  Neuro: no gross deficits.  Psych:  alert and oriented  Ortho Exam  Shoulder: Inspection reveals no obvious deformity, atrophy, or asymmetry. No bruising. No swelling Palpation TTP over posterior and lateral subacromial space Limited flexion, abduction, external and internal rotation secondary to pain NV intact distally Normal scapular function observed. Special Tests:  - Impingement: Positive Hawkins, neers - Supraspinatous: 5/5 strength with resisted flexion at 20 degrees- painful - Infraspinatous/Teres Minor: 5/5 strength with ER - Subscapularis: 5/5 strength with IR Pain with reaching overhead  Lumbar spine: - Inspection: no gross deformity or asymmetry, swelling or  ecchymosis - Palpation: No TTP over the spinous processes. TTP over left SI joint - ROM: limited ROM with extension and flexion of back - Strength: 5/5 strength of lower extremity in L4-S1 nerve root distributions b/l; trendelenburg gait favoring left side - Neuro: sensation intact in the L4-S1 nerve root distribution b/l, -  Special testing: negative Stork test, positive FABER Pain in lateral hip with internal rotation  Imaging: No results found.  Past Medical/Family/Surgical/Social History: Medications & Allergies reviewed per EMR, new medications updated. Patient Active Problem List   Diagnosis Date Noted  . Healthcare maintenance 06/30/2018  . Left knee pain 12/30/2017  . Left shoulder pain 12/30/2017  . Closed fracture of right tibial plateau 02/18/2017  . Benign hypertension 02/15/2017  . Screening examination for venereal disease 03/06/2015  . Encounter for long-term (current) use of medications 03/06/2015  . Elevated LFTs 12/11/2014  . Abnormal ultrasound 12/11/2014  . Abdominal pain, epigastric 12/11/2014  . ASCUS of cervix with negative high risk HPV 10/31/2013  . Gall bladder disease 02/03/2013  . 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  . Compression fracture of L3 lumbar vertebra 07/01/2011  . Thyroid dysfunction 07/01/2011  . Anemia 07/01/2011  . Sore throat 04/02/2011  . Hyperparathyroidism 02/10/2011  . Osteopenia 02/03/2011  . PAP SMEAR, LGSIL, ABNORMAL 12/21/2009  . COUGH 07/18/2009  . Chronic hepatitis C virus infection ([place]) 12/19/2008  . LEUKORRHEA 04/07/2008  . FATIGUE 04/07/2008  . BACK PAIN, LUMBAR 11/10/2007  . ALLERGIC RHINITIS 07/09/2007  . PNEUMOCYSTIS PNEUMONIA 05/07/2007  . HYPERTENSION 05/07/2007  . DENTAL CARIES 05/07/2007  . INSOMNIA 05/07/2007  . PNEUMONIA, HX OF 05/07/2007  . HERPES ZOSTER, HX OF 05/07/2007  . Human immunodeficiency virus (HIV) disease ([place]) 08/04/2006   Past Medical History:  Diagnosis Date  . Allergic rhinitis   . Biliary colic   . Gall bladder disease \  . Hepatitis C   . Herpes   . HIV infection ([place])   . Hyperparathyroidism ([place])   . Hypertension   . Insomnia   . Osteoporosis   . Pneumonia    2010   Family History  Problem Relation Age of Onset  . Diabetes Mother   . Hypertension Mother    Past  Surgical History:  Procedure Laterality Date  . CHOLECYSTECTOMY N/A 01/09/2015   Procedure: LAPAROSCOPIC CHOLECYSTECTOMY;  Surgeon: [name], MD;  Location: [facility];  Service: General;  Laterality: N/A;  . ECTOPIC PREGNANCY SURGERY    . ORIF TIBIA PLATEAU Right 02/18/2017   Procedure: OPEN REDUCTION INTERNAL FIXATION (ORIF) RIGHT TIBIAL PLATEAU;  Surgeon: Leandrew Koyanagi, MD;  Location: [facility];  Service: Orthopedics;  Laterality: Right;  . SHOULDER SURGERY Right 12/2014   Social History   Occupational History  . Occupation: CNA  Tobacco Use  . Smoking status: Never Smoker  . Smokeless tobacco: Never Used  Substance and Sexual Activity  . Alcohol use: Yes    Alcohol/week: 3.0 standard drinks    Types: 3 Standard drinks or equivalent per week    Comment: wine or [place] alcohol.  Occasional  . Drug use: No  . Sexual activity: Yes    Partners: Male    Birth control/protection: Condom    Comment: declined condoms

## 2019-04-18 ENCOUNTER — Ambulatory Visit (INDEPENDENT_AMBULATORY_CARE_PROVIDER_SITE_OTHER): Payer: Medicare HMO | Admitting: Family Medicine

## 2019-04-18 ENCOUNTER — Other Ambulatory Visit: Payer: Self-pay

## 2019-04-18 ENCOUNTER — Encounter: Payer: Self-pay | Admitting: Family Medicine

## 2019-04-18 DIAGNOSIS — M533 Sacrococcygeal disorders, not elsewhere classified: Secondary | ICD-10-CM

## 2019-04-18 NOTE — Progress Notes (Signed)
I saw and examined the patient with Dr. [name] Masker and agree with assessment and plan as outlined.    Left shoulder is much better, but left SI joint continues to hurt.  Not much relief from blind injection.  Will refer to Dr. Ernestina Patches for fluoro-guided left SI injection.

## 2019-04-18 NOTE — Progress Notes (Signed)
Vanessa Beard - 68 y.o. female MRN [id number]  Date of birth: 1951/04/17  Office Visit Note: Visit Date: 04/18/2019 PCP: Seward Carol, MD Referred by: Seward Carol, MD  Subjective: Chief Complaint  Patient presents with  . Left Hip - Pain    Persistent left buttock pain. The injection at last office visit did not help.   HPI: Vanessa Beard is a 68 y.o. female who comes in today for follow up of chronic left shoulder pain as well as left hip pain.  Left shoulder- improved ROM and pain after subacromial injection on 11/17. She continues to do PT.  Left hip- pain in SI joint. She reports that she has had brief improvement after injection for a few days but after that he has a had a chronic, dull ache in her SI joint. Pain is worse when twisting to the left and when transitioning from sitting to standing. No pain in groin. No numbness/tingling down legs. She has had epidural spinal injections in lumbar spine in the past with minimal improvement.    ROS Otherwise per HPI.  Assessment & Plan: Visit Diagnoses:  1. Sacroiliac joint dysfunction of left side     Plan:  SI joint dysfunction, left sided- minimal improvement with landmark guided injection. Will refer to Dr. Ernestina Patches for fluoroscopy guided SI joint injection.   Meds & Orders: No orders of the defined types were placed in this encounter.   Orders Placed This Encounter  Procedures  . Ambulatory referral to Physical Medicine Rehab    Follow-up: PRN  Procedures:  Clinical History: No specialty comments available.   She reports that she has never smoked. She has never used smokeless tobacco. No results for input(s): HGBA1C, LABURIC in the last 8760 hours.  Objective:  VS:  HT:    WT:   BMI:     BP:   HR: bpm  TEMP: ( )  RESP:  Physical Exam  PHYSICAL EXAM: Gen: NAD, alert, cooperative with exam, well-appearing HEENT: clear conjunctiva,  CV:  no edema, capillary refill brisk, normal rate Resp:  non-labored Skin: no rashes, normal turgor  Neuro: no gross deficits.  Psych:  alert and oriented  Ortho Exam  Left Shoulder: Inspection reveals no obvious deformity, atrophy, or asymmetry. No bruising. No swelling No TTP Full ROM NV intact distally   Lumbar spine: - Inspection: no gross deformity or asymmetry, swelling or ecchymosis - Palpation: No TTP over the spinous processes. TTP over left SI joint - ROM: Full ROM with extension and flexion of back - Strength: 5/5 strength of lower extremity in L4-S1 nerve root distributions b/l; trendelenburg gait favoring left side - Neuro: sensation intact in the L4-S1 nerve root distribution b/l, - Special testing: negative Stork test, positive FABER. Negative FADIR  Imaging: No results found.  Past Medical/Family/Surgical/Social History: Medications & Allergies reviewed per EMR, new medications updated. Patient Active Problem List   Diagnosis Date Noted  . Healthcare maintenance 06/30/2018  . Left knee pain 12/30/2017  . Left shoulder pain 12/30/2017  . Closed fracture of right tibial plateau 02/18/2017  . Benign hypertension 02/15/2017  . Screening examination for venereal disease 03/06/2015  . Encounter for long-term (current) use of medications 03/06/2015  . Elevated LFTs 12/11/2014  . Abnormal ultrasound 12/11/2014  . Abdominal pain, epigastric 12/11/2014  . ASCUS of cervix with negative high risk HPV 10/31/2013  . Gall bladder disease 02/03/2013  . 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  . Compression fracture of L3  lumbar vertebra 07/01/2011  . Thyroid dysfunction 07/01/2011  . Anemia 07/01/2011  . Sore throat 04/02/2011  . Hyperparathyroidism 02/10/2011  . Osteopenia 02/03/2011  . PAP SMEAR, LGSIL, ABNORMAL 12/21/2009  . COUGH 07/18/2009  . Chronic hepatitis C virus infection ([place]) 12/19/2008  . LEUKORRHEA 04/07/2008  . FATIGUE 04/07/2008  . BACK PAIN, LUMBAR 11/10/2007  . ALLERGIC  RHINITIS 07/09/2007  . PNEUMOCYSTIS PNEUMONIA 05/07/2007  . HYPERTENSION 05/07/2007  . DENTAL CARIES 05/07/2007  . INSOMNIA 05/07/2007  . PNEUMONIA, HX OF 05/07/2007  . HERPES ZOSTER, HX OF 05/07/2007  . Human immunodeficiency virus (HIV) disease ([place]) 08/04/2006   Past Medical History:  Diagnosis Date  . Allergic rhinitis   . Biliary colic   . Gall bladder disease \  . Hepatitis C   . Herpes   . HIV infection ([place])   . Hyperparathyroidism ([place])   . Hypertension   . Insomnia   . Osteoporosis   . Pneumonia    2010   Family History  Problem Relation Age of Onset  . Diabetes Mother   . Hypertension Mother    Past Surgical History:  Procedure Laterality Date  . CHOLECYSTECTOMY N/A 01/09/2015   Procedure: LAPAROSCOPIC CHOLECYSTECTOMY;  Surgeon: [name], MD;  Location: [facility];  Service: General;  Laterality: N/A;  . ECTOPIC PREGNANCY SURGERY    . ORIF TIBIA PLATEAU Right 02/18/2017   Procedure: OPEN REDUCTION INTERNAL FIXATION (ORIF) RIGHT TIBIAL PLATEAU;  Surgeon: Leandrew Koyanagi, MD;  Location: [facility];  Service: Orthopedics;  Laterality: Right;  . SHOULDER SURGERY Right 12/2014   Social History   Occupational History  . Occupation: CNA  Tobacco Use  . Smoking status: Never Smoker  . Smokeless tobacco: Never Used  Substance and Sexual Activity  . Alcohol use: Yes    Alcohol/week: 3.0 standard drinks    Types: 3 Standard drinks or equivalent per week    Comment: wine or [place] alcohol.  Occasional  . Drug use: No  . Sexual activity: Yes    Partners: Male    Birth control/protection: Condom    Comment: declined condoms

## 2019-05-06 ENCOUNTER — Ambulatory Visit: Payer: Self-pay

## 2019-05-06 ENCOUNTER — Ambulatory Visit (INDEPENDENT_AMBULATORY_CARE_PROVIDER_SITE_OTHER): Payer: Medicare HMO | Admitting: Physical Medicine and Rehabilitation

## 2019-05-06 ENCOUNTER — Encounter: Payer: Self-pay | Admitting: Physical Medicine and Rehabilitation

## 2019-05-06 ENCOUNTER — Other Ambulatory Visit: Payer: Self-pay

## 2019-05-06 DIAGNOSIS — M461 Sacroiliitis, not elsewhere classified: Secondary | ICD-10-CM

## 2019-05-06 NOTE — Progress Notes (Signed)
 .  Numeric Pain Rating Scale and Functional Assessment Average Pain 8   In the last MONTH (on 0-10 scale) has pain interfered with the following?  1. General activity like being  able to carry out your everyday physical activities such as walking, climbing stairs, carrying groceries, or moving a chair?  Rating(8)    -Dye Allergies.  

## 2019-05-09 DIAGNOSIS — M461 Sacroiliitis, not elsewhere classified: Secondary | ICD-10-CM

## 2019-05-09 MED ORDER — METHYLPREDNISOLONE ACETATE 80 MG/ML IJ SUSP
80.0000 mg | INTRAMUSCULAR | Status: AC | PRN
  Administered 2019-05-09: 80 mg via INTRA_ARTICULAR

## 2019-05-09 MED ORDER — BUPIVACAINE HCL 0.5 % IJ SOLN
2.0000 mL | INTRAMUSCULAR | Status: AC | PRN
  Administered 2019-05-09: 2 mL via INTRA_ARTICULAR

## 2019-05-09 NOTE — Progress Notes (Signed)
Vanessa Beard - 68 y.o. female MRN [id number]  Date of birth: 10/08/1950  Office Visit Note: Visit Date: 05/06/2019 PCP: Seward Carol, MD Referred by: Seward Carol, MD  Subjective: No chief complaint on file.  HPI:  Vanessa Beard is a 68 y.o. female who comes in today At the request of Benjiman Core, PA-C for diagnostic and hopefully therapeutic left sacroiliac joint injection under fluoroscopic guidance.  ROS Otherwise per HPI.  Assessment & Plan: Visit Diagnoses:  1. Sacroiliitis ([place])     Plan: No additional findings.   Meds & Orders: No orders of the defined types were placed in this encounter.   Orders Placed This Encounter  Procedures  . Sacroiliac Joint Inj  . C-ARM NO Order    Follow-up: Return for visit to requesting physician as needed.   Procedures: Sacroiliac Joint Inj (Left) on 05/09/2019 5:38 AM Indications: pain and diagnostic evaluation Details: 22 G 3.5 in needle, fluoroscopy-guided posterior approach Medications: 2 mL bupivacaine 0.5 %; 80 mg methylPREDNISolone acetate 80 MG/ML Outcome: tolerated well, no immediate complications  There was excellent flow of contrast producing a partial arthrogram of the sacroiliac joint.  Procedure, treatment alternatives, risks and benefits explained, specific risks discussed. Consent was given by the patient. Immediately prior to procedure a time out was called to verify the correct patient, procedure, equipment, support staff and site/side marked as required. Patient was prepped and draped in the usual sterile fashion.      No notes on file   Clinical History: MRI LUMBAR SPINE WITHOUT CONTRAST  TECHNIQUE: Multiplanar, multisequence MR imaging of the lumbar spine was performed. No intravenous contrast was administered.  COMPARISON:  None available.  FINDINGS: Segmentation: Standard. Lowest well-formed disc labeled the L5-S1 level.  Alignment: Trace levoscoliosis. Vertebral bodies otherwise  normally aligned with preservation of the normal lumbar lordosis. No listhesis.  Vertebrae: Chronic compression deformity involving the T12 vertebral body with up to 40% height loss and trace 3 mm bony retropulsion. Mild compression deformity involving the right aspect of the superior endplate of L3 with up to 30% height loss without significant bony retropulsion. Additional chronic compression deformity involving the superior endplate of L4 with up to 35% height loss and 3 mm bony retropulsion. Superimposed endplate Schmorl's node deformities noted about these chronic fractures. These are benign/mechanical in appearance. Vertebral body height otherwise maintained. No evidence for acute or subacute fracture. Underlying bone marrow signal intensity within normal limits. No discrete or worrisome osseous lesions. Mild reactive edema about the bilateral L5-S1 facets due to facet arthritis (series 4, image 4, 12). No other abnormal marrow edema.  Conus medullaris and cauda equina: Conus extends to the L2 level. Conus and cauda equina appear normal.  Paraspinal and other soft tissues: Paraspinous soft tissues within normal limits. Visualized visceral structures unremarkable. Scattered atherosclerotic change noted within the intra-abdominal aorta.  Disc levels:  T10-11: Unremarkable.  T11-12: Mild disc bulge. 3 mm bony retropulsion related to the chronic T12 compression fracture. No significant stenosis.  T12-L1: Unremarkable.  L1-2: Mild disc bulge. Superimposed tiny left central disc extrusion minimally indents the left ventral thecal sac (series 7, image 9). No significant canal or foraminal stenosis.  L2-3: Mild annular disc bulge. Mild facet and ligament flavum hypertrophy. No significant canal or foraminal stenosis. No impingement.  L3-4: Diffuse disc bulge with an disc desiccation and mild intervertebral disc space narrowing. 3 mm bony retropulsion related to  the chronic L4 compression fracture. Moderate facet and ligament flavum hypertrophy. Resultant  moderate canal stenosis. Mild bilateral L3 foraminal narrowing.  L4-5: Mild disc bulge, slightly asymmetric to the left. Mild facet and ligament flavum hypertrophy. Resultant borderline mild spinal stenosis. Mild bilateral L4 foraminal narrowing.  L5-S1: Mild diffuse disc bulge with disc desiccation. Disc bulging slightly asymmetric to the left, with superimposed tiny central disc protrusion (series 7, image 31). Advanced facet and ligament flavum hypertrophy with associated small bilateral joint effusions and reactive marrow edema. Mild epidural lipomatosis. Resultant mild spinal stenosis. Moderate bilateral L5 foraminal narrowing.  IMPRESSION: 1. Chronic compression fractures involving the T12, L3, and L4 vertebral bodies as detailed above. No acute or subacute injury within the lumbar spine. 2. Multifactorial degenerative changes at L3-4 with resultant moderate canal with mild bilateral L3 foraminal stenosis. 3. Moderate bilateral L5 foraminal stenosis related to disc bulge and facet hypertrophy, potentially affecting either of the exiting L5 nerve roots. 4. Prominent facet arthrosis at L5-S1 with associated reactive marrow edema. Finding could contribute to underlying low back pain.   Electronically Signed   By: Jeannine Boga M.D.   On: 12/04/2018 22:50     Objective:  VS:  HT:    WT:   BMI:     BP:   HR: bpm  TEMP: ( )  RESP:  Physical Exam  Ortho Exam Imaging: No results found.

## 2019-05-24 ENCOUNTER — Encounter: Payer: Self-pay | Admitting: Family Medicine

## 2019-05-24 ENCOUNTER — Telehealth: Payer: Self-pay | Admitting: Family Medicine

## 2019-05-24 ENCOUNTER — Ambulatory Visit (INDEPENDENT_AMBULATORY_CARE_PROVIDER_SITE_OTHER): Payer: Medicare HMO | Admitting: Family Medicine

## 2019-05-24 ENCOUNTER — Other Ambulatory Visit: Payer: Self-pay

## 2019-05-24 DIAGNOSIS — M533 Sacrococcygeal disorders, not elsewhere classified: Secondary | ICD-10-CM

## 2019-05-24 NOTE — Telephone Encounter (Signed)
Patient called.   Dr.Hilts referred her to [facility] and they need information on the patient sent over either via fax or disk   Her appointment with them is on 05/25/2019 at [facility]    Fax number: [phone]  Patient Call Back: [phone]

## 2019-05-24 NOTE — Telephone Encounter (Signed)
Placed CD at front desk.
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
## 2019-05-24 NOTE — Telephone Encounter (Signed)
Would you please make a cd of this patient's Lsp xray(s) and any hip xrays (if she had any). Please see the original message. She needs to pick this up in the morning before her appointment at 73.
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
## 2019-06-07 DIAGNOSIS — Z1389 Encounter for screening for other disorder: Secondary | ICD-10-CM | POA: Diagnosis not present

## 2019-06-07 DIAGNOSIS — I10 Essential (primary) hypertension: Secondary | ICD-10-CM | POA: Diagnosis not present

## 2019-06-07 DIAGNOSIS — D472 Monoclonal gammopathy: Secondary | ICD-10-CM | POA: Diagnosis not present

## 2019-06-07 DIAGNOSIS — R69 Illness, unspecified: Secondary | ICD-10-CM | POA: Diagnosis not present

## 2019-06-07 DIAGNOSIS — E559 Vitamin D deficiency, unspecified: Secondary | ICD-10-CM | POA: Diagnosis not present

## 2019-06-07 DIAGNOSIS — Z0000 Encounter for general adult medical examination without abnormal findings: Secondary | ICD-10-CM | POA: Diagnosis not present

## 2019-06-07 DIAGNOSIS — M810 Age-related osteoporosis without current pathological fracture: Secondary | ICD-10-CM | POA: Diagnosis not present

## 2019-06-07 DIAGNOSIS — Z23 Encounter for immunization: Secondary | ICD-10-CM | POA: Diagnosis not present

## 2019-06-09 DIAGNOSIS — I10 Essential (primary) hypertension: Secondary | ICD-10-CM | POA: Diagnosis not present

## 2019-06-09 DIAGNOSIS — H5200 Hypermetropia, unspecified eye: Secondary | ICD-10-CM | POA: Diagnosis not present

## 2019-06-09 DIAGNOSIS — Z0100 Encounter for examination of eyes and vision without abnormal findings: Secondary | ICD-10-CM | POA: Diagnosis not present

## 2019-07-04 DIAGNOSIS — G8929 Other chronic pain: Secondary | ICD-10-CM | POA: Diagnosis not present

## 2019-07-04 DIAGNOSIS — M1990 Unspecified osteoarthritis, unspecified site: Secondary | ICD-10-CM | POA: Diagnosis not present

## 2019-07-04 DIAGNOSIS — K649 Unspecified hemorrhoids: Secondary | ICD-10-CM | POA: Diagnosis not present

## 2019-07-04 DIAGNOSIS — M810 Age-related osteoporosis without current pathological fracture: Secondary | ICD-10-CM | POA: Diagnosis not present

## 2019-07-04 DIAGNOSIS — R69 Illness, unspecified: Secondary | ICD-10-CM | POA: Diagnosis not present

## 2019-07-04 DIAGNOSIS — K5900 Constipation, unspecified: Secondary | ICD-10-CM | POA: Diagnosis not present

## 2019-07-04 DIAGNOSIS — Z6832 Body mass index (BMI) 32.0-32.9, adult: Secondary | ICD-10-CM | POA: Diagnosis not present

## 2019-07-04 DIAGNOSIS — I10 Essential (primary) hypertension: Secondary | ICD-10-CM | POA: Diagnosis not present

## 2019-07-04 DIAGNOSIS — G4700 Insomnia, unspecified: Secondary | ICD-10-CM | POA: Diagnosis not present

## 2019-07-04 DIAGNOSIS — E669 Obesity, unspecified: Secondary | ICD-10-CM | POA: Diagnosis not present

## 2019-07-05 DIAGNOSIS — K600 Acute anal fissure: Secondary | ICD-10-CM | POA: Diagnosis not present

## 2019-07-08 DIAGNOSIS — K6289 Other specified diseases of anus and rectum: Secondary | ICD-10-CM | POA: Diagnosis not present

## 2019-07-08 DIAGNOSIS — R195 Other fecal abnormalities: Secondary | ICD-10-CM | POA: Diagnosis not present

## 2019-07-14 ENCOUNTER — Ambulatory Visit (INDEPENDENT_AMBULATORY_CARE_PROVIDER_SITE_OTHER): Payer: Medicare HMO | Admitting: Gastroenterology

## 2019-07-14 ENCOUNTER — Encounter: Payer: Self-pay | Admitting: Gastroenterology

## 2019-07-14 VITALS — BP 142/84 | HR 88 | Temp 96.7°F | Ht 60.0 in | Wt 170.6 lb

## 2019-07-14 DIAGNOSIS — K602 Anal fissure, unspecified: Secondary | ICD-10-CM | POA: Diagnosis not present

## 2019-07-14 DIAGNOSIS — K5900 Constipation, unspecified: Secondary | ICD-10-CM | POA: Insufficient documentation

## 2019-07-14 DIAGNOSIS — K6289 Other specified diseases of anus and rectum: Secondary | ICD-10-CM | POA: Diagnosis not present

## 2019-07-14 MED ORDER — NA SULFATE-K SULFATE-MG SULF 17.5-3.13-1.6 GM/177ML PO SOLN: 1.0000 | Freq: Once | ORAL | 0 refills | Status: AC

## 2019-07-14 MED ORDER — AMBULATORY NON FORMULARY MEDICATION: 0 refills | Status: DC

## 2019-07-14 NOTE — Progress Notes (Signed)
07/14/2019 ICES KAMERER [id number] Dec 08, 1950   HISTORY OF PRESENT ILLNESS: This is a 69 year old female who has been referred here by her PCP, Dr. Delfina Redwood, for evaluation regarding anal fissure/rectal pain/abnormal rectal exam.  She tells me that she been experiencing constipation.  She had a hard stool with straining and believe that this caused a tear in her anal area.  Describes it as a burning, sharp type pain.  She has been taking sitz bath and using topical RectiCare/lidocaine.  Was reportedly heme positive on their exam at the PCPs office, but was not Hemoccult today.  She denies seeing any blood in her stool, says that she just sees very small amounts of bright red blood on the toilet paper upon wiping.  She has never had colonoscopy in the past.  She says that she has been experiencing some issues with constipation over the last 1.5 months or so.  Has tried stool softeners and MiraLAX, but does not sound like she has been using them consistently.  Says that this is a new issue as she has never had problems with constipation in the past.  Patient was seen here by me in 2016, supervised by Dr. Ardis Hughs.   Past Medical History:  Diagnosis Date  . Allergic rhinitis   . Biliary colic   . Gall bladder disease \  . Hepatitis C   . Herpes   . HIV infection ([place])   . Hyperparathyroidism ([place])   . Hypertension   . Insomnia   . Osteoporosis   . Pneumonia    2010   Past Surgical History:  Procedure Laterality Date  . CHOLECYSTECTOMY N/A 01/09/2015   Procedure: LAPAROSCOPIC CHOLECYSTECTOMY;  Surgeon: [name], MD;  Location: [facility];  Service: General;  Laterality: N/A;  . ECTOPIC PREGNANCY SURGERY    . ORIF TIBIA PLATEAU Right 02/18/2017   Procedure: OPEN REDUCTION INTERNAL FIXATION (ORIF) RIGHT TIBIAL PLATEAU;  Surgeon: Leandrew Koyanagi, MD;  Location: [facility];  Service: Orthopedics;  Laterality: Right;  . SHOULDER SURGERY Right 12/2014    reports that she has never smoked. She  has never used smokeless tobacco. She reports previous alcohol use. She reports that she does not use drugs. family history includes Diabetes in her mother; Hypertension in her mother. Allergies  Allergen Reactions  . Dapsone Nausea Only    stomach  burning  . Retrovir [Zidovudine] Other (See Comments)    Changed skin color  . Sulfamethoxazole-Trimethoprim Other (See Comments)    Flu like symptoms      Outpatient Encounter Medications as of 07/14/2019  Medication Sig  . amLODipine (NORVASC) 10 MG tablet Take 1 tablet (10 mg total) by mouth daily.  . calcium carbonate (TUMS - DOSED IN MG ELEMENTAL CALCIUM) 500 MG chewable tablet Chew 1 tablet by mouth as needed for indigestion or heartburn.  Marland Kitchen ibuprofen (ADVIL) 800 MG tablet TAKE 1 TABLET BY MOUTH EVERY 8 HOURS AS NEEDED  . nabumetone (RELAFEN) 750 MG tablet Take 1 tablet (750 mg total) by mouth 2 (two) times daily as needed.  . TRIUMEQ 600-50-300 MG tablet TAKE 1 TABLET BY MOUTH DAILY  . TURMERIC PO Take by mouth daily.  Marland Kitchen zolpidem (AMBIEN) 10 MG tablet Take 1 tablet (10 mg total) by mouth at bedtime as needed.  . ferrous sulfate 325 (65 FE) MG tablet Take 325 mg by mouth daily.   . Multiple Vitamins-Minerals (ADULT GUMMY) CHEW Chew 3 capsules by mouth daily.  . [DISCONTINUED] baclofen (LIORESAL) 10  MG tablet Take 0.5-1 tablets (5-10 mg total) by mouth at bedtime as needed for muscle spasms. (Patient not taking: Reported on 05/24/2019)  . [DISCONTINUED] diazepam (VALIUM) 5 MG tablet Take 1 by mouth 1 hour  pre-procedure with very light food. May bring 2nd tablet to appointment.  . [DISCONTINUED] ergocalciferol (VITAMIN D2) 1.25 MG (50000 UT) capsule ergocalciferol (vitamin D2) 1,250 mcg (50,000 unit) capsule  TK ONE C PO ONCE A WK   No facility-administered encounter medications on file as of 07/14/2019.     REVIEW OF SYSTEMS  : All other systems reviewed and negative except where noted in the History of Present Illness.   PHYSICAL  EXAM: BP (!) 142/84   Pulse 88   Temp (!) 96.7 F (35.9 C)   Ht 5' (1.524 m)   Wt 170 lb 9.6 oz (77.4 kg)   BMI 33.32 kg/m  General: Well developed AA female in no acute distress Head: Normocephalic and atraumatic Eyes:  Sclerae anicteric, conjunctiva pink. Ears: Normal auditory acuity Lungs: Clear throughout to auscultation; no increased WOB. Heart: Regular rate and rhythm; no M/R/G. Abdomen: Soft, non-distended.  BS present.  Non-tender. Rectal: Posterior lesion noted, question skin tag with a superimposed fissure.  This was tender on exam.  DRE revealed a mass just inside the anal canal, question arising from the anus versus rectum.  This is more anterior.   Musculoskeletal: Symmetrical with no gross deformities  Skin: No lesions on visible extremities Extremities: No edema  Neurological: Alert oriented x 4, grossly nonfocal Psychological:  Alert and cooperative. Normal mood and affect  ASSESSMENT AND PLAN: *Anal pain secondary to anal fissure seen on exam today:  ?  If this is associated at all with the mass that was also felt on exam today.  For now we will prescribe nitro gel to be applied to the area twice daily.  Can continue topical lidocaine as well as sitz bath.  Needs to keep stools soft. *Rectal mass: Felt on exam today.  We will plan for colonoscopy with Dr. Havery Moros early next week as Dr. Ardis Hughs does not have anything available until mid March. *Change in bowel habits with constipation: Advised to begin MiraLAX daily.  **The risks, benefits, and alternatives to colonoscopy were discussed with the patient and she consents to proceed.   CC:  Seward Carol, MD

## 2019-07-14 NOTE — Progress Notes (Signed)
I agree with the above note, plan 

## 2019-07-14 NOTE — Patient Instructions (Addendum)
If you are age 71 or older, your body mass index should be between 23-30. Your Body mass index is 33.32 kg/m. If this is out of the aforementioned range listed, please consider follow up with your Primary Care Provider.  If you are age 34 or younger, your body mass index should be between 19-25. Your Body mass index is 33.32 kg/m. If this is out of the aformentioned range listed, please consider follow up with your Primary Care Provider.   Your provider has prescribed Nitroglycerin gel for you. Please follow the directions written on your prescription bottle or given to you specifically by your provider. Since this is a specialty medication and is not readily available at most local pharmacies, we have sent your prescription to:  [facility] information is below: Address: [address] [place], [place] [address]  [phone]  *Please DO NOT go directly from our office to pick up this medication! Give the pharmacy 1 day to process the prescription as this is compounded and takes time to make.  Continue Lidocaine as needed. Start Miralax daily.   You have been scheduled for a colonoscopy. Please follow written instructions given to you at your visit today.  Please pick up your prep supplies at the pharmacy within the next 1-3 days. If you use inhalers (even only as needed), please bring them with you on the day of your procedure.   How to Take a [employer] A sitz bath is a warm water bath that may be used to care for your rectum, genital area, or the area between your rectum and genitals (perineum). For a sitz bath, the water only comes up to your hips and covers your buttocks. A sitz bath may done at home in a bathtub or with a portable sitz bath that fits over the toilet. Your health care provider may recommend a sitz bath to help:  Relieve pain and discomfort after delivering a baby.  Relieve pain and itching from hemorrhoids or anal fissures.  Relieve pain after  certain surgeries.  Relax muscles that are sore or tight. How to take a sitz bath Take 3-4 sitz baths a day, or as many as told by your health care provider. Bathtub sitz bath To take a sitz bath in a bathtub: 1. Partially fill a bathtub with warm water. The water should be deep enough to cover your hips and buttocks when you are sitting in the tub. 2. If your health care provider told you to put medicine in the water, follow his or her instructions. 3. Sit in the water. 4. Open the tub drain a little, and leave it open during your bath. 5. Turn on the warm water again, enough to replace the water that is draining out. Keep the water running throughout your bath. This helps keep the water at the right level and the right temperature. 6. Soak in the water for 15-20 minutes, or as long as told by your health care provider. 7. When you are done, be careful when you stand up. You may feel dizzy. 8. After the sitz bath, pat yourself dry. Do not rub your skin to dry it.  Over-the-toilet sitz bath To take a sitz bath with an over-the-toilet basin: 1. Follow the manufacturer's instructions. 2. Fill the basin with warm water. 3. If your health care provider told you to put medicine in the water, follow his or her instructions. 4. Sit on the seat. Make sure the water covers your buttocks and perineum.  5. Soak in the water for 15-20 minutes, or as long as told by your health care provider. 6. After the sitz bath, pat yourself dry. Do not rub your skin to dry it. 7. Clean and dry the basin between uses. 8. Discard the basin if it cracks, or according to the manufacturer's instructions. Contact a health care provider if:  Your symptoms get worse. Do not continue with sitz baths if your symptoms get worse.  You have new symptoms. If this happens, do not continue with sitz baths until you talk with your health care provider. Summary  A sitz bath is a warm water bath in which the water only comes up  to your hips and covers your buttocks.  A sitz bath may help relieve itching, relieve pain, and relax muscles that are sore or tight in the lower part of your body, including your genital area.  Take 3-4 sitz baths a day, or as many as told by your health care provider. Soak in the water for 15-20 minutes.  Do not continue with sitz baths if your symptoms get worse. This information is not intended to replace advice given to you by your health care provider. Make sure you discuss any questions you have with your health care provider. Document Revised: 10/04/2018 Document Reviewed: 05/07/2017 Elsevier Patient Education  [place].

## 2019-07-15 ENCOUNTER — Encounter: Payer: Self-pay | Admitting: Gastroenterology

## 2019-07-15 ENCOUNTER — Ambulatory Visit (INDEPENDENT_AMBULATORY_CARE_PROVIDER_SITE_OTHER): Payer: Medicare HMO

## 2019-07-15 ENCOUNTER — Other Ambulatory Visit: Payer: Self-pay | Admitting: Gastroenterology

## 2019-07-15 ENCOUNTER — Other Ambulatory Visit: Payer: Self-pay

## 2019-07-15 DIAGNOSIS — Z1159 Encounter for screening for other viral diseases: Secondary | ICD-10-CM

## 2019-07-16 LAB — SARS CORONAVIRUS 2 (TAT 6-24 HRS): SARS Coronavirus 2: NEGATIVE

## 2019-07-19 ENCOUNTER — Encounter: Payer: Self-pay | Admitting: Gastroenterology

## 2019-07-19 ENCOUNTER — Ambulatory Visit (AMBULATORY_SURGERY_CENTER): Payer: Medicare HMO | Admitting: Gastroenterology

## 2019-07-19 ENCOUNTER — Other Ambulatory Visit: Payer: Self-pay

## 2019-07-19 VITALS — BP 167/95 | HR 66 | Temp 97.5°F | Resp 19 | Ht 60.0 in | Wt 170.0 lb

## 2019-07-19 DIAGNOSIS — K6289 Other specified diseases of anus and rectum: Secondary | ICD-10-CM | POA: Diagnosis not present

## 2019-07-19 DIAGNOSIS — C20 Malignant neoplasm of rectum: Secondary | ICD-10-CM | POA: Diagnosis not present

## 2019-07-19 DIAGNOSIS — D123 Benign neoplasm of transverse colon: Secondary | ICD-10-CM

## 2019-07-19 DIAGNOSIS — R6889 Other general symptoms and signs: Secondary | ICD-10-CM

## 2019-07-19 DIAGNOSIS — C218 Malignant neoplasm of overlapping sites of rectum, anus and anal canal: Secondary | ICD-10-CM | POA: Diagnosis not present

## 2019-07-19 MED ORDER — SODIUM CHLORIDE 0.9 % IV SOLN: 500.0000 mL | Freq: Once | INTRAVENOUS | Status: DC

## 2019-07-19 NOTE — Progress Notes (Signed)
Called to room to assist during endoscopic procedure.  Patient ID and intended procedure confirmed with present staff. Received instructions for my participation in the procedure from the performing physician.  

## 2019-07-19 NOTE — Progress Notes (Signed)
PT taken to PACU. Monitors in place. VSS. Report given to RN. 

## 2019-07-19 NOTE — Progress Notes (Signed)
Pt's states no medical or surgical changes since previsit or office visit.  LC - temp DT - vitals 

## 2019-07-19 NOTE — Op Note (Signed)
[facility] Patient Name: Vanessa Beard Procedure Date: 07/19/2019 11:06 AM MRN: [id number] Endoscopist: Remo Lipps P. Havery Moros , MD Age: 69 Referring MD:  Date of Birth: 06-20-1950 Gender: Female Account #: [id number] Procedure:                Colonoscopy Indications:              This is the patient's first colonoscopy, Abnormal                            rectal exam, Rectal pain Medicines:                Monitored Anesthesia Care Procedure:                Pre-Anesthesia Assessment:                           - Prior to the procedure, a History and Physical                            was performed, and patient medications and                            allergies were reviewed. The patient's tolerance of                            previous anesthesia was also reviewed. The risks                            and benefits of the procedure and the sedation                            options and risks were discussed with the patient.                            All questions were answered, and informed consent                            was obtained. Prior Anticoagulants: The patient has                            taken no previous anticoagulant or antiplatelet                            agents. ASA Grade Assessment: II - A patient with                            mild systemic disease. After reviewing the risks                            and benefits, the patient was deemed in                            satisfactory condition to undergo the procedure.  After obtaining informed consent, the colonoscope                            was passed under direct vision. Throughout the                            procedure, the patient's blood pressure, pulse, and                            oxygen saturations were monitored continuously. The                            Colonoscope was introduced through the anus and                            advanced to the the terminal  ileum, with                            identification of the appendiceal orifice and IC                            valve. The colonoscopy was performed without                            difficulty. The patient tolerated the procedure                            well. The quality of the bowel preparation was                            good. The terminal ileum, ileocecal valve,                            appendiceal orifice, and rectum were photographed. Scope In: 11:15:01 AM Scope Out: 11:34:53 AM Scope Withdrawal Time: 0 hours 13 minutes 58 seconds  Total Procedure Duration: 0 hours 19 minutes 52 seconds  Findings:                 The digital rectal exam findings include ulcer                            noted at the anal canal in posterior midline canal                            that extends into the distal rectum 1-2cm or so.                            The tissue is nodular, friable, with adherent heme.                           The terminal ileum appeared normal.                           A 3 mm polyp was found in  the hepatic flexure. The                            polyp was sessile. The polyp was removed with a                            cold snare. Resection and retrieval were complete.                           A 5 mm polyp was found in the transverse colon. The                            polyp was flat. The polyp was removed with a cold                            snare. Resection and retrieval were complete.                           A few small-mouthed diverticula were found in the                            ascending colon.                           Ulcerated mucosa was noted in the distal rectum                            (distal 1-2cm) extending into the anal canal. The                            tissue was nodular, quite friable, with contact                            hemorrhage. Biopsies were taken with a cold forceps                            for histology to rule out  dysplasia / malignancy.                            Retroflexed views obtained (see photos) but not                            well visualized given inability of the rectum to                            retain air.                           The exam was otherwise without abnormality. Complications:            No immediate complications. Estimated blood loss:                            Minimal. Estimated Blood Loss:  Estimated blood loss was minimal. Impression:               - Ulcer noted at the anal canal in posterior                            midline canal that extends into the distal rectum                            on digital rectal exam, nodular and somewhat hard                            to palpation. Endoscopic images show nodular tissue                            in the area, concerning for malignant ulcer.                           - The examined portion of the ileum was normal.                           - One 3 mm polyp at the hepatic flexure, removed                            with a cold snare. Resected and retrieved.                           - One 5 mm polyp in the transverse colon, removed                            with a cold snare. Resected and retrieved.                           - Diverticulosis in the ascending colon.                           - The examination was otherwise normal. Recommendation:           - Patient has a contact number available for                            emergencies. The signs and symptoms of potential                            delayed complications were discussed with the                            patient. Return to normal activities tomorrow.                            Written discharge instructions were provided to the                            patient.                           -  Resume previous diet.                           - Continue present medications.                           - Await pathology results (sent rush), will have                             results in 24-48 hours with further recommendations. Remo Lipps P. Leane Loring, MD 07/19/2019 11:45:21 AM This report has been signed electronically.

## 2019-07-19 NOTE — Patient Instructions (Signed)
Ulceration noted, anal.  Biopsies taken.  Await pathology. 2 polyps removed. Diverticulosis noted.     YOU HAD AN ENDOSCOPIC PROCEDURE TODAY AT [facility]:   Refer to the procedure report that was given to you for any specific questions about what was found during the examination.  If the procedure report does not answer your questions, please call your gastroenterologist to clarify.  If you requested that your care partner not be given the details of your procedure findings, then the procedure report has been included in a sealed envelope for you to review at your convenience later.  YOU SHOULD EXPECT: Some feelings of bloating in the abdomen. Passage of more gas than usual.  Walking can help get rid of the air that was put into your GI tract during the procedure and reduce the bloating. If you had a lower endoscopy (such as a colonoscopy or flexible sigmoidoscopy) you may notice spotting of blood in your stool or on the toilet paper. If you underwent a bowel prep for your procedure, you may not have a normal bowel movement for a few days.  Please Note:  You might notice some irritation and congestion in your nose or some drainage.  This is from the oxygen used during your procedure.  There is no need for concern and it should clear up in a day or so.  SYMPTOMS TO REPORT IMMEDIATELY:   Following lower endoscopy (colonoscopy or flexible sigmoidoscopy):  Excessive amounts of blood in the stool  Significant tenderness or worsening of abdominal pains  Swelling of the abdomen that is new, acute  Fever of 100F or higher   For urgent or emergent issues, a gastroenterologist can be reached at any hour by calling [phone].   DIET:  We do recommend a small meal at first, but then you may proceed to your regular diet.  Drink plenty of fluids but you should avoid alcoholic beverages for 24 hours.  ACTIVITY:  You should plan to take it easy for the rest of today and you  should NOT DRIVE or use heavy machinery until tomorrow (because of the sedation medicines used during the test).    FOLLOW UP: Our staff will call the number listed on your records 48-72 hours following your procedure to check on you and address any questions or concerns that you may have regarding the information given to you following your procedure. If we do not reach you, we will leave a message.  We will attempt to reach you two times.  During this call, we will ask if you have developed any symptoms of COVID 19. If you develop any symptoms (ie: fever, flu-like symptoms, shortness of breath, cough etc.) before then, please call [phone].  If you test positive for Covid 19 in the 2 weeks post procedure, please call and report this information to [place].    If any biopsies were taken you will be contacted by phone or by letter within the next 1-3 weeks.  Please call us at [phone] if you have not heard about the biopsies in 3 weeks.    SIGNATURES/CONFIDENTIALITY: You and/or your care partner have signed paperwork which will be entered into your electronic medical record.  These signatures attest to the fact that that the information above on your After Visit Summary has been reviewed and is understood.  Full responsibility of the confidentiality of this discharge information lies with you and/or your care-partner.

## 2019-07-21 ENCOUNTER — Other Ambulatory Visit: Payer: Self-pay

## 2019-07-21 ENCOUNTER — Telehealth: Payer: Self-pay

## 2019-07-21 DIAGNOSIS — C20 Malignant neoplasm of rectum: Secondary | ICD-10-CM

## 2019-07-21 NOTE — Telephone Encounter (Signed)
  Follow up Call-  Call back number 07/19/2019  Post procedure Call Back phone  # [phone]  Permission to leave phone message Yes  Some recent data might be hidden     Patient questions:  Do you have a fever, pain , or abdominal swelling? No. Pain Score  0 *  Have you tolerated food without any problems? Yes.    Have you been able to return to your normal activities? Yes.    Do you have any questions about your discharge instructions: Diet   No. Medications  No. Follow up visit  No.  Do you have questions or concerns about your Care? No.  Actions: * If pain score is 4 or above: No action needed, pain <4.  1. Have you developed a fever since your procedure? no  2.   Have you had an respiratory symptoms (SOB or cough) since your procedure? no  3.   Have you tested positive for COVID 19 since your procedure no  4.   Have you had any family members/close contacts diagnosed with the COVID 19 since your procedure?  no   If yes to any of these questions please route to Joylene John, RN and Alphonsa Gin, Therapist, sports.

## 2019-07-22 ENCOUNTER — Telehealth: Payer: Self-pay | Admitting: Hematology

## 2019-07-22 NOTE — Progress Notes (Signed)
[place]   [phone] Fax:([phone]) [place] Note   Patient Care Team: Seward Carol, MD as PCP - General (Internal Medicine) Carlyle Basques, MD as PCP - Infectious Diseases (Infectious Diseases) Binnie Rail, DC as Referring Physician (Chiropractic Medicine) Jonnie Finner, RN as Oncology Nurse Navigator  Date of Service:  07/26/2019   CHIEF COMPLAINTS/PURPOSE OF CONSULTATION:  Newly Diagnosed Squamous Cell Carcinoma of Rectum   REFERRING PHYSICIAN:  Dr. Havery Moros  Oncology History Overview Note  Cancer Staging Anal cancer [facility]) Staging form: Anus, AJCC 8th Edition - Clinical: Stage IIA (cT2, cN0, cM0) - Signed by Truitt Merle, MD on 07/26/2019    Anal cancer ([place])  07/19/2019 Procedure   Colonoscopy by Dr Havery Moros 07/19/19  IMPRESSION - Ulcer noted at the anal canal in posterior midline canal that extends into the distal rectum on digital rectal exam, nodular and somewhat hard to palpation. Endoscopic images show nodular tissue in the area, concerning for malignant ulcer. - The examined portion of the ileum was normal. - One 3 mm polyp at the hepatic flexure, removed with a cold snare. Resected and retrieved. - One 5 mm polyp in the transverse colon, removed with a cold snare. Resected and retrieved. - Diverticulosis in the ascending colon. - The examination was otherwise normal.   07/19/2019 Initial Biopsy   Diagnosis 07/19/19 1. Transverse Colon Polyp, hepatic flexure (2) - TUBULAR ADENOMA (1 OF 3 FRAGMENTS) - BENIGN COLONIC MUCOSA (2 OF 3 FRAGMENTS) - NO HIGH GRADE DYSPLASIA OR MALIGNANCY IDENTIFIED 2. Rectum, biopsy, distal rectal anal canal - SQUAMOUS CELL CARCINOMA - SEE COMMENT   07/26/2019 Initial Diagnosis   Anal cancer ([place])   07/26/2019 Cancer Staging   Staging form: Anus, AJCC 8th Edition - Clinical: Stage IIA (cT2, cN0, cM0) - Signed by Truitt Merle, MD on 07/26/2019    Radiation Therapy   PENDING Concurrent  ChemoRT with Dr. Lisbeth Renshaw     Chemotherapy   PENDING Concurrent ChemoRT with Mitomycin and 5FU on week 1 and 5.        HISTORY OF PRESENTING ILLNESS:  Vanessa Beard 69 y.o. female is a here because of newly diagnosed rectal cancer. The patient was referred by Dr. Havery Moros. The patient presents to the clinic today alone.   For the past 2 months she has had constipation. She did try Miralax but did get some relief with Stool softeners. She notes no pain expect stinging sensation. She had been seen by GI because she thought she had fistula. She notes she has rectal discomfort even without BM. She denies SOB, cough or chest discomfort. She denies abdominal pain or bloating. She notes she has been fasting for the last few days and she is fasting for her church for 2 weeks. She notes her weight is overall stable and her energy level is baseline.   Socially she is divorced and lives alone. She had 2 adult children in [place]. She is getting bachelors degree in Criminal Justice through Online schooling. She is currently working at [employer] as [employer] and works 8-9 hours every day. She only smoked in high school and drinks occasionally. She notes her work is flexible to take time off if needed.   They have a PMHx of HIV (contracted through sex) and controlled with Dr. Graylon Good. She has Hep C and has been treated 2 years ago. She has HTN, on Amlodipine. She is on Diazepam as needed.  She has had shoulder and  knee surgery and cholecystectomy. She denies family history of cancer.    REVIEW OF SYSTEMS:    Constitutional: Denies fevers, chills or abnormal night sweats Eyes: Denies blurriness of vision, double vision or watery eyes Ears, nose, mouth, throat, and face: Denies mucositis or sore throat Respiratory: Denies cough, dyspnea or wheezes Cardiovascular: Denies palpitation, chest discomfort or lower extremity swelling Gastrointestinal:  Denies nausea, heartburn or change in bowel  habits (+) Rectal discomfort  Skin: Denies abnormal skin rashes Lymphatics: Denies new lymphadenopathy or easy bruising Neurological:Denies numbness, tingling or new weaknesses Behavioral/Psych: Mood is stable, no new changes  All other systems were reviewed with the patient and are negative.   MEDICAL HISTORY:  Past Medical History:  Diagnosis Date  . Allergic rhinitis   . Biliary colic   . Gall bladder disease \  . Hepatitis C   . Herpes   . HIV infection ([place])   . Hyperparathyroidism ([place])   . Hypertension   . Insomnia   . Osteoporosis   . Pneumonia    2010    SURGICAL HISTORY: Past Surgical History:  Procedure Laterality Date  . CHOLECYSTECTOMY N/A 01/09/2015   Procedure: LAPAROSCOPIC CHOLECYSTECTOMY;  Surgeon: [name], MD;  Location: [facility];  Service: General;  Laterality: N/A;  . ECTOPIC PREGNANCY SURGERY    . ORIF TIBIA PLATEAU Right 02/18/2017   Procedure: OPEN REDUCTION INTERNAL FIXATION (ORIF) RIGHT TIBIAL PLATEAU;  Surgeon: Leandrew Koyanagi, MD;  Location: [facility];  Service: Orthopedics;  Laterality: Right;  . SHOULDER SURGERY Right 12/2014    SOCIAL HISTORY: Social History   Socioeconomic History  . Marital status: Divorced    Spouse name: Not on file  . Number of children: 2  . Years of education: Not on file  . Highest education level: Not on file  Occupational History  . Occupation: CNA  Tobacco Use  . Smoking status: Never Smoker  . Smokeless tobacco: Never Used  Substance and Sexual Activity  . Alcohol use: Not Currently    Comment: occasionally   . Drug use: No  . Sexual activity: Yes    Partners: Male    Birth control/protection: Condom    Comment: declined condoms  Other Topics Concern  . Not on file  Social History Narrative  . Not on file   Social Determinants of Health   Financial Resource Strain:   . Difficulty of Paying Living Expenses: Not on file  Food Insecurity:   . Worried About Charity fundraiser in the Last Year: Not  on file  . Ran Out of Food in the Last Year: Not on file  Transportation Needs:   . Lack of Transportation (Medical): Not on file  . Lack of Transportation (Non-Medical): Not on file  Physical Activity:   . Days of Exercise per Week: Not on file  . Minutes of Exercise per Session: Not on file  Stress:   . Feeling of Stress : Not on file  Social Connections:   . Frequency of Communication with Friends and Family: Not on file  . Frequency of Social Gatherings with Friends and Family: Not on file  . Attends Religious Services: Not on file  . Active Member of Clubs or Organizations: Not on file  . Attends Archivist Meetings: Not on file  . Marital Status: Not on file  Intimate Partner Violence:   . Fear of Current or Ex-Partner: Not on file  . Emotionally Abused: Not on file  . Physically Abused: Not  on file  . Sexually Abused: Not on file    FAMILY HISTORY: Family History  Problem Relation Age of Onset  . Diabetes Mother   . Hypertension Mother   . Colon cancer Neg Hx   . Colon polyps Neg Hx   . Esophageal cancer Neg Hx   . Pancreatic cancer Neg Hx   . Stomach cancer Neg Hx   . Liver disease Neg Hx     ALLERGIES:  is allergic to dapsone; retrovir [zidovudine]; and sulfamethoxazole-trimethoprim.  MEDICATIONS:  Current Outpatient Medications  Medication Sig Dispense Refill  . AMBULATORY NON FORMULARY MEDICATION Nitroglycerine ointment 0.125 %  Apply a pea sized amount internally four times daily. 30 g 0  . amLODipine (NORVASC) 10 MG tablet Take 1 tablet (10 mg total) by mouth daily. 90 tablet 3  . amoxicillin (AMOXIL) 500 MG capsule amoxicillin 500 mg capsule  TAKE 1 CAPSULE BY MOUTH 3 TIMES DAILY FOR 10 DAYS FOR DENTAL INFECTION    . calcium carbonate (TUMS - DOSED IN MG ELEMENTAL CALCIUM) 500 MG chewable tablet Chew 1 tablet by mouth as needed for indigestion or heartburn.    . diazepam (VALIUM) 5 MG tablet diazepam 5 mg tablet    . ferrous sulfate 325 (65 FE)  MG tablet Take 325 mg by mouth daily.     . hydrocortisone (ANUSOL-HC) 2.5 % rectal cream Anusol-HC 2.5 % topical cream with perineal applicator  APPLY A THIN LAYER TO THE AFFECTED AREA(S) BY TOPICAL ROUTE 2-4 TIMESDAILY    . hydrocortisone 2.5 % cream hydrocortisone 2.5 % topical cream with perineal applicator  APPLY THIN LAYER EXTERNALLY TO THE AFFECTED AREA 2 TO 4 TIMES DAILY    . ibuprofen (ADVIL) 800 MG tablet TAKE 1 TABLET BY MOUTH EVERY 8 HOURS AS NEEDED 90 tablet 0  . Multiple Vitamins-Minerals (ADULT GUMMY) CHEW Chew 3 capsules by mouth daily.    . nabumetone (RELAFEN) 750 MG tablet Take 1 tablet (750 mg total) by mouth 2 (two) times daily as needed. 60 tablet 6  . RECTICARE 5 % CREA APPLY EXTERNALLY TO THE AFFECTED AREA FOUR TIMES DAILY FOR 14 DAYS AS NEEDED    . TRIUMEQ 600-50-300 MG tablet TAKE 1 TABLET BY MOUTH DAILY 30 tablet 3  . TURMERIC PO Take by mouth daily.    Marland Kitchen zolpidem (AMBIEN) 10 MG tablet Take 1 tablet (10 mg total) by mouth at bedtime as needed. 30 tablet 1   No current facility-administered medications for this visit.    PHYSICAL EXAMINATION: ECOG PERFORMANCE STATUS: 0 - Asymptomatic  Vitals:   07/26/19 0920 07/26/19 0929  BP: (!) 153/101 (!) 128/104  Pulse: 95   Resp: 17   Temp: (!) 97.4 F (36.3 C)   SpO2: 96%    Filed Weights   07/26/19 0920  Weight: 167 lb 11.2 oz (76.1 kg)    GENERAL:alert, no distress and comfortable SKIN: skin color, texture, turgor are normal, no rashes or significant lesions EYES: normal, Conjunctiva are pink and non-injected, sclera clear  NECK: supple, thyroid normal size, non-tender, without nodularity LYMPH:  no palpable lymphadenopathy in the cervical, axillary or inguinal LUNGS: clear to auscultation and percussion with normal breathing effort HEART: regular rate & rhythm and no murmurs and no lower extremity edema ABDOMEN:abdomen soft, non-tender and normal bowel sounds. No hepatomegaly  Musculoskeletal:no cyanosis of  digits and no clubbing  NEURO: alert & oriented x 3 with fluent speech, no focal motor/sensory deficits RECTAL: (+) Mild anal discharge. (+) Palpable mass  in right anal canal, firm and nodular, very tender. No blood on glove.   LABORATORY DATA:  I have reviewed the data as listed CBC Latest Ref Rng & Units 02/07/2019 02/07/2019 10/04/2018  WBC 3.8 - 10.8 Thousand/uL 7.0 7.1 4.8  Hemoglobin 11.7 - 15.5 g/dL 12.4 12.5 13.0  Hematocrit 35.0 - 45.0 % 36.8 37.8 37.9  Platelets 140 - 400 Thousand/uL 199 191 180    CMP Latest Ref Rng & Units 02/07/2019 10/04/2018 06/30/2018  Glucose 65 - 99 mg/dL 109(H) 88 91  BUN 7 - 25 mg/dL 21 21 13   Creatinine 0.50 - 0.99 mg/dL 1.04(H) 0.98 0.94  Sodium 135 - 146 mmol/L 143 141 141  Potassium 3.5 - 5.3 mmol/L 3.4(L) 3.7 3.9  Chloride 98 - 110 mmol/L 107 108 107  CO2 20 - 32 mmol/L 27 25 26   Calcium 8.6 - 10.4 mg/dL 9.5 9.7 9.6  Total Protein 6.1 - 8.1 g/dL 7.0 7.8 8.0  Total Bilirubin 0.2 - 1.2 mg/dL 0.4 0.6 0.6  Alkaline Phos 38 - 126 U/L - - -  AST 10 - 35 U/L 18 21 21   ALT 6 - 29 U/L 13 10 10      RADIOGRAPHIC STUDIES: I have personally reviewed the radiological images as listed and agreed with the findings in the report. No results found.  ASSESSMENT & PLAN:  Vanessa Beard is a 69 y.o. African American female with a history of Hep C, HIV, HTN, Hyperparathyroidism, Osteoporosis, S/p cholecystectomy    1. Anal squamous Cell Carcinoma, cT2NxMx -I reviewed her colonoscopy and biopsy report with patient in great detail. Based on the description on colonoscopy report, she has more than 2 cm long mass in her low rectum anal canal. Her biopsy results show Squamous Cell Carcinoma.  This is consistent with anal cancer.  She has HIV, which is a high risk for anal cancer. -Will obtain PET scan to complete staging. She is agreeable.  -We reviewed the natural course of anal cancer, and the risk of recurrence after definitive therapy.  I discussed the option  of surgery and concurrent chemoRT for now metastatic anal cancer. Give her tumor size and location, I recommend standard concurrent chemoradiation, which will preserve her organ function. -I discussed chemotherapy with Mitomycin and 5FU on week 1 and 5, which is the standard therapy. Although she is 69 yo, she is very physically active and fit, works 7 days a week, I do think she is a candidate for concurrent chemoradiation with standard dose.  --Chemotherapy consent: Side effects including but does not limited to, fatigue, nausea, vomiting, diarrhea, hair loss, neuropathy, fluid retention, renal and kidney dysfunction, neutropenic fever, needed for blood transfusion, bleeding, were discussed with patient in great detail. She agrees to proceed. -The goal of therapy is curative -She will have Chemo education class before start of treatment. She will have PICC line placed on first day of chemo on week 1 and 5.  -I discussed treatment will get harder in the later weeks and will need help at home. She understands. I also recommend she follow up with Gyn as her vagina can be effected from treatment.  -She is mostly asymptomatic currently. She notes only having rectal discomfort without BM and stinging with BMs. Physical exam today shows no abdominal abnormalities but near right rectal mass is palpable and tender.  -F/u on her first day of treatment.   2. Hep C, HIV -She underwent successful Hep C treatment 2 years ago (in 2018) -Her HIV  was contracted through sex. Her HIV is well controlled. She will continue to f/u ID Dr Graylon Good. Last CD4 count was 386 on 02/07/2019.    3. Comorbidities: HTN, Hyperparathyroidism, Osteoporosis  -Continue medications and f/u with PCP.    4. Social Support  -She is divorced, lives alone and has 2 adult children who live in [place]  -She is getting bachelors degree in Criminal Justice through Online schooling, she plans to complete in 10/2019 but may postpone her final  classes.  -She is currently working at [employer] as [employer] and works 8-9 hours every day. She notes her work is flexible to take time off if needed.     PLAN:  -PET scan ASAP for staging, if approved, will cancel her CT scans -Refer to Rad Onc Dr. Lisbeth Renshaw  -Chemo Education Class in 1-2 weeks  -PICC line, lab and chemo Mitomycin and 5FU will be scheduled with first day of radiation  -f/u on her first day treatment  -pelvic PT referral, dietician referral (non-urgent)   Orders Placed This Encounter  Procedures  . NM PET Image Initial (PI) Skull Base To Thigh    Standing Status:   Future    Standing Expiration Date:   07/25/2020    Order Specific Question:   If indicated for the ordered procedure, I authorize the administration of a radiopharmaceutical per Radiology protocol    Answer:   Yes    Order Specific Question:   Preferred imaging location?    Answer:   [facility]    Order Specific Question:   Radiology Contrast Protocol - do NOT remove file path    Answer:   [email or web address]  . Ambulatory referral to Physical Therapy    Referral Priority:   Routine    Referral Type:   Physical Medicine    Referral Reason:   Specialty Services Required    Requested Specialty:   Physical Therapy    Number of Visits Requested:   1    All questions were answered. The patient knows to call the clinic with any problems, questions or concerns. The total time spent in the appointment was 60 minutes.     Truitt Merle, MD 07/26/2019   I, Joslyn Devon, am acting as scribe for Truitt Merle, MD.   I have reviewed the above documentation for accuracy and completeness, and I agree with the above.

## 2019-07-22 NOTE — Telephone Encounter (Signed)
Received a new pt referral from Dr. Havery Moros for squamous cell carcinoma of rectum. Pt has been cld and scheduled to see Dr. Burr Medico on 3/9 at 8:40am. Pt aware to arrive 15 minutes early.

## 2019-07-25 ENCOUNTER — Telehealth: Payer: Self-pay | Admitting: Gastroenterology

## 2019-07-25 NOTE — Telephone Encounter (Signed)
Pt last saw Dr Havery Moros for procedure.

## 2019-07-25 NOTE — Telephone Encounter (Signed)
Called patient back and she states the Nitroglycern gel that was prescribed by Alonza Bogus PA helped for a while, but now she is hurting again. She still has some left, but says she sits a lot and she is in pain. Sitz baths were recommended, but she does not have a tub that works right. States money is also a concern, so she would like something cheap. Please advise

## 2019-07-25 NOTE — Telephone Encounter (Signed)
This is a difficult situation. Has she been using Recticare? If not she should be using this every few hours as needed. If using this and pain poorly controlled may need some adjunctive oral medication

## 2019-07-26 ENCOUNTER — Encounter: Payer: Self-pay | Admitting: Hematology

## 2019-07-26 ENCOUNTER — Inpatient Hospital Stay: Payer: Medicare HMO | Attending: Hematology | Admitting: Hematology

## 2019-07-26 ENCOUNTER — Telehealth: Payer: Self-pay

## 2019-07-26 ENCOUNTER — Other Ambulatory Visit: Payer: Self-pay

## 2019-07-26 VITALS — BP 128/104 | HR 95 | Temp 97.4°F | Resp 17 | Ht 60.0 in | Wt 167.7 lb

## 2019-07-26 DIAGNOSIS — Z79899 Other long term (current) drug therapy: Secondary | ICD-10-CM | POA: Diagnosis not present

## 2019-07-26 DIAGNOSIS — Z8759 Personal history of other complications of pregnancy, childbirth and the puerperium: Secondary | ICD-10-CM | POA: Diagnosis not present

## 2019-07-26 DIAGNOSIS — Z833 Family history of diabetes mellitus: Secondary | ICD-10-CM | POA: Diagnosis not present

## 2019-07-26 DIAGNOSIS — Z8249 Family history of ischemic heart disease and other diseases of the circulatory system: Secondary | ICD-10-CM | POA: Insufficient documentation

## 2019-07-26 DIAGNOSIS — Z21 Asymptomatic human immunodeficiency virus [HIV] infection status: Secondary | ICD-10-CM | POA: Diagnosis not present

## 2019-07-26 DIAGNOSIS — I10 Essential (primary) hypertension: Secondary | ICD-10-CM | POA: Insufficient documentation

## 2019-07-26 DIAGNOSIS — K5730 Diverticulosis of large intestine without perforation or abscess without bleeding: Secondary | ICD-10-CM | POA: Insufficient documentation

## 2019-07-26 DIAGNOSIS — D123 Benign neoplasm of transverse colon: Secondary | ICD-10-CM | POA: Diagnosis not present

## 2019-07-26 DIAGNOSIS — C210 Malignant neoplasm of anus, unspecified: Secondary | ICD-10-CM | POA: Insufficient documentation

## 2019-07-26 DIAGNOSIS — E213 Hyperparathyroidism, unspecified: Secondary | ICD-10-CM | POA: Diagnosis not present

## 2019-07-26 DIAGNOSIS — C211 Malignant neoplasm of anal canal: Secondary | ICD-10-CM | POA: Diagnosis not present

## 2019-07-26 DIAGNOSIS — Z9049 Acquired absence of other specified parts of digestive tract: Secondary | ICD-10-CM | POA: Insufficient documentation

## 2019-07-26 DIAGNOSIS — Z5111 Encounter for antineoplastic chemotherapy: Secondary | ICD-10-CM | POA: Insufficient documentation

## 2019-07-26 DIAGNOSIS — M810 Age-related osteoporosis without current pathological fracture: Secondary | ICD-10-CM | POA: Diagnosis not present

## 2019-07-26 DIAGNOSIS — R69 Illness, unspecified: Secondary | ICD-10-CM | POA: Diagnosis not present

## 2019-07-26 DIAGNOSIS — Z791 Long term (current) use of non-steroidal anti-inflammatories (NSAID): Secondary | ICD-10-CM | POA: Insufficient documentation

## 2019-07-26 DIAGNOSIS — B1920 Unspecified viral hepatitis C without hepatic coma: Secondary | ICD-10-CM | POA: Diagnosis not present

## 2019-07-26 DIAGNOSIS — J984 Other disorders of lung: Secondary | ICD-10-CM | POA: Diagnosis not present

## 2019-07-26 DIAGNOSIS — Z881 Allergy status to other antibiotic agents status: Secondary | ICD-10-CM | POA: Insufficient documentation

## 2019-07-26 MED ORDER — PROCHLORPERAZINE MALEATE 10 MG PO TABS: 10.0000 mg | ORAL_TABLET | Freq: Four times a day (QID) | ORAL | 1 refills | Status: DC | PRN

## 2019-07-26 MED ORDER — ONDANSETRON HCL 8 MG PO TABS: 8.0000 mg | ORAL_TABLET | Freq: Two times a day (BID) | ORAL | 1 refills | Status: DC | PRN

## 2019-07-26 NOTE — Progress Notes (Signed)
Met patient at her initial medical oncology appointment with Dr. Burr Medico.  She was given my direct number as contact.  She was also given CSW phone number.  She voiced many concerns one of which is she is currently trying to finish her Bachelor's Degree in [place]. She also currently works some though retired.  She is concerned about mediation cost and how chemotherapy is going to affect her life at this point.  She states she has always been very independent and doesn't want her life to change.  I explained to her she will be getting chemo education class which will help answer a lot of her questions regarding treatment and what to expect.  I reassured her that medications will be prescribed to help manage her side effects and we do have financial assistance that can potentially help with the costs.  I encouraged her to call if she has any questions or concerns anytime.  She verbalized an understanding of what the next steps are.  I have made a referral to SW to contact her as well.   Valda Favia RN GI Nurse Navigator

## 2019-07-26 NOTE — Telephone Encounter (Signed)
Left message to please call back. °

## 2019-07-26 NOTE — Progress Notes (Signed)
START ON PATHWAY REGIMEN - Anal Carcinoma     Chemotherapy concurrent with RT:     Mitomycin      Fluorouracil   **Always confirm dose/schedule in your pharmacy ordering system**  Patient Characteristics: Anal Canal Tumors, Newly Diagnosed - Locoregional Disease (Clinical Staging) Therapeutic Status: Newly Diagnosed - Locoregional Disease (Clinical Staging) AJCC T Category: cT2 AJCC N Category: cN0 AJCC 8 Stage Grouping: IIA AJCC M Category: cM0 Intent of Therapy: Curative Intent, Discussed with Patient 

## 2019-07-27 ENCOUNTER — Other Ambulatory Visit: Payer: Self-pay

## 2019-07-27 ENCOUNTER — Telehealth: Payer: Self-pay | Admitting: Gastroenterology

## 2019-07-27 ENCOUNTER — Encounter: Payer: Self-pay | Admitting: *Deleted

## 2019-07-27 ENCOUNTER — Telehealth: Payer: Self-pay | Admitting: Hematology

## 2019-07-27 ENCOUNTER — Telehealth: Payer: Self-pay | Admitting: General Practice

## 2019-07-27 ENCOUNTER — Encounter: Payer: Self-pay | Admitting: General Practice

## 2019-07-27 DIAGNOSIS — C210 Malignant neoplasm of anus, unspecified: Secondary | ICD-10-CM

## 2019-07-27 NOTE — Progress Notes (Signed)
GI Location of Tumor / Histology: Anal Cancer- Squamous cell carcinoma  Staging form: Anus, AJCC 8th Edition - Clinical: Stage IIA (cT2, cN0, cM0) - Signed by Truitt Merle, MD on 07/26/2019  Vanessa Beard presented with couple months of constipation and rectal discomfort without BM.  She tried miralax and had some relief.  Saw PCP, did rectal exam and was referred out to GI.  PET 08/04/2019  Colonoscopy 07/19/19: Ulcer noted at the anal canal in posterior midline canal that extends into the distal rectum on digital rectal exam, nodular and somewhat hard to palpation.  Endoscopic images show nodular tissue in the area, concerning for malignant ulcer.  Biopsies of Rectum 07/19/2019   Past/Anticipated interventions by surgeon, if any:   Past/Anticipated interventions by medical oncology, if any:  Dr. Burr Medico 07/26/2019 - Based on the description on colonoscopy report, she has more than 2 cm long mass in her low rectum anal canal. Her biopsy results show Squamous Cell Carcinoma.  This is consistent with anal cancer.  She has HIV, which is a high risk for anal cancer. -Will obtain PET scan to complete staging. She is agreeable.  -Given her tumor size and location, I recommend standard concurrent chemoradiation, which will preserve her organ function. -I discussed chemotherapy with Mitomycin and 5FU on week 1 and 5, which is the standard therapy. Although she is 69 yo, she is very physically active and fit, works 7 days a week, I do think she is a candidate for concurrent chemoradiation with standard dose.   Weight changes, if any: Stable, no weight loss noted.  Bowel/Bladder complaints, if any: She is using stool softeners and eating more veggies.  Nausea / Vomiting, if any: No  Pain issues, if any:  Having rectal pain, using nitroglycerin gel, and recticare.  Any blood per rectum: No  SAFETY ISSUES:  Prior radiation? No  Pacemaker/ICD? No  Possible current pregnancy? Postmenopausal  Is the  patient on methotrexate? No  Current Complaints/Details: -HIV positive, sees Dr. Graylon Good

## 2019-07-27 NOTE — Progress Notes (Signed)
[place] Work   Clinical Social Work received referral from [employer] for financial concerns.  CSW contacted patient at home to offer support and assess for needs.  Patients voicemail was full and CSW was unable to leave a message.  CSW also notified financial advocate of patient concerns, and they will see patient once she begins treatment.   Johnnye Lana, MSW, LCSW, OSW-C Clinical Social Worker [facility] [phone]

## 2019-07-27 NOTE — Progress Notes (Signed)
Spoke to patient to explained PET scan was approved and scheduled for 3/18.  She had already received a call about this and confirmed time and instructions.  I explained we cancelled the CT scan because the PET is the scan we need.  She verbalized an understanding.  I also explained to her that Dr. Burr Medico had referred her for Pelvic PT and what this is for.  She voiced concerns over finances affording medications.  I explained to her that our financial liaisons had already been contacted and as soon as she starts treatment they will be able to assist her with any grants that may be available.  She appreciated this information.  Encouraged her to call back if she should have any further questions or concerns.

## 2019-07-27 NOTE — Telephone Encounter (Signed)
Called patient and she states she is out of the Recticare and when she purchased it before, it cost her $55. Vanessa Beard she would like something that would be prescription and be covered by her Ins. I asked her to call her Ins. Co. and find out what would be covered, in that category of Recticare. She agreed  And will get back with [place]

## 2019-07-27 NOTE — Telephone Encounter (Signed)
[place] CSW Progress Notes  REquest received to call patient re concerns re medication costs.  Spoke w patient - she cannot afford to purchase specialty medicine prescribed by Dr Havery Moros - nitroglycerin 0.125% cream.  [place].  This is a compound medication - available through [facility] or [employer].  Patient has already gotten price fro  [facility] and cannot afford t purchase it. She has contacted her Doctor, general practice - they are working w Education officer, museum w Airline pilot to determine if they can provide any assistance.  [place] will be following up w patient re [facility] grant once she has treatment plan in place.  Patient advised of the above.  Edwyna Shell, LCSW Clinical Social Worker Phone:  [phone]

## 2019-07-27 NOTE — Telephone Encounter (Signed)
Scheduled appt per 3/9 los.  Spoke with pt and she is aware of the appt date and time.

## 2019-07-28 ENCOUNTER — Other Ambulatory Visit: Payer: Self-pay

## 2019-07-28 ENCOUNTER — Encounter: Payer: Self-pay | Admitting: Radiation Oncology

## 2019-07-28 ENCOUNTER — Ambulatory Visit
Admission: RE | Admit: 2019-07-28 | Discharge: 2019-07-28 | Disposition: A | Discharge status: Home / Self Care | Payer: Medicare HMO | Source: Ambulatory Visit | Attending: Radiation Oncology | Admitting: Radiation Oncology

## 2019-07-28 ENCOUNTER — Telehealth: Payer: Self-pay | Admitting: General Practice

## 2019-07-28 ENCOUNTER — Telehealth: Payer: Self-pay | Admitting: Gastroenterology

## 2019-07-28 VITALS — Ht 60.0 in | Wt 167.0 lb

## 2019-07-28 DIAGNOSIS — C211 Malignant neoplasm of anal canal: Secondary | ICD-10-CM | POA: Diagnosis not present

## 2019-07-28 DIAGNOSIS — G893 Neoplasm related pain (acute) (chronic): Secondary | ICD-10-CM | POA: Diagnosis not present

## 2019-07-28 DIAGNOSIS — C210 Malignant neoplasm of anus, unspecified: Secondary | ICD-10-CM

## 2019-07-28 DIAGNOSIS — R69 Illness, unspecified: Secondary | ICD-10-CM | POA: Diagnosis not present

## 2019-07-28 MED ORDER — HYDROCODONE-ACETAMINOPHEN 5-325 MG PO TABS: 1.0000 | ORAL_TABLET | Freq: Four times a day (QID) | ORAL | 0 refills | Status: DC | PRN

## 2019-07-28 MED ORDER — AMBULATORY NON FORMULARY MEDICATION: 1 refills | Status: DC

## 2019-07-28 NOTE — Progress Notes (Signed)
Radiation Oncology         ([phone]) [phone] ________________________________  Name: Vanessa Beard        MRN: [id number]  Date of Service: 07/28/2019 DOB: Aug 01, 1950  NO:BSJGGE, Jori Moll, MD  Armbruster, Carlota Raspberry, MD     REFERRING PHYSICIAN: Havery Moros Carlota Raspberry, MD   DIAGNOSIS: The encounter diagnosis was Anal cancer [facility]).   HISTORY OF PRESENT ILLNESS: Vanessa Beard is a 69 y.o. female seen at the request of Dr. Burr Medico for newly diagnosed squamous cell carcinoma of the anus.  The patient had been experiencing several months of constipation, rectal pain and had only minimal relief with MiraLAX.  She presented to GI and was evaluated with colonoscopy on 07/19/2019, there was an ulcer noted at the anal canal in the posterior midline of the canal extending to the distal rectum on digital exam and endoscopic images showed nodular tissue in the area consistent with a malignancy.  The biopsy obtained during colonoscopy revealed a squamous cell carcinoma of the distal rectum and anal canal, she also had several tubular adenomatous changes without any high-grade dysplastic findings.  Of note the patient does have a history of abnormal Pap smears, however her most recent in October 2020 was negative and high-risk HPV testing was also negative.  She has met with Dr. Burr Medico and has plans to undergo pet imaging on 08/04/2019.  She is seen today to discuss treatment recommendations for her cancer  PREVIOUS RADIATION THERAPY: No   PAST MEDICAL HISTORY:  Past Medical History:  Diagnosis Date  . Allergic rhinitis   . Biliary colic   . Gall bladder disease \  . Hepatitis C   . Herpes   . HIV infection ([place])   . Hyperparathyroidism ([place])   . Hypertension   . Insomnia   . Osteoporosis   . Pneumonia    2010       PAST SURGICAL HISTORY: Past Surgical History:  Procedure Laterality Date  . CHOLECYSTECTOMY N/A 01/09/2015   Procedure: LAPAROSCOPIC CHOLECYSTECTOMY;  Surgeon: [name], MD;   Location: [place];  Service: General;  Laterality: N/A;  . ECTOPIC PREGNANCY SURGERY    . ORIF TIBIA PLATEAU Right 02/18/2017   Procedure: OPEN REDUCTION INTERNAL FIXATION (ORIF) RIGHT TIBIAL PLATEAU;  Surgeon: Leandrew Koyanagi, MD;  Location: [place];  Service: Orthopedics;  Laterality: Right;  . SHOULDER SURGERY Right 12/2014     FAMILY HISTORY:  Family History  Problem Relation Age of Onset  . Diabetes Mother   . Hypertension Mother   . Colon cancer Neg Hx   . Colon polyps Neg Hx   . Esophageal cancer Neg Hx   . Pancreatic cancer Neg Hx   . Stomach cancer Neg Hx   . Liver disease Neg Hx      SOCIAL HISTORY:  reports that she has never smoked. She has never used smokeless tobacco. She reports previous alcohol use. She reports that she does not use drugs.  The patient is divorced.  She lives in [place] and works for [employer] as a Quarry manager. She is working on her bachelor's degree and hopes to graduate in August 2021, and is interested in working on a master's degree as well.   ALLERGIES: Dapsone, Retrovir [zidovudine], and Sulfamethoxazole-trimethoprim   MEDICATIONS:  Current Outpatient Medications  Medication Sig Dispense Refill  . AMBULATORY NON FORMULARY MEDICATION Nitroglycerine ointment 0.125 %  Apply a pea sized amount internally four times daily. 30 g 0  . amLODipine (NORVASC) 10  MG tablet Take 1 tablet (10 mg total) by mouth daily. 90 tablet 3  . amoxicillin (AMOXIL) 500 MG capsule amoxicillin 500 mg capsule  TAKE 1 CAPSULE BY MOUTH 3 TIMES DAILY FOR 10 DAYS FOR DENTAL INFECTION    . calcium carbonate (TUMS - DOSED IN MG ELEMENTAL CALCIUM) 500 MG chewable tablet Chew 1 tablet by mouth as needed for indigestion or heartburn.    . diazepam (VALIUM) 5 MG tablet diazepam 5 mg tablet    . ferrous sulfate 325 (65 FE) MG tablet Take 325 mg by mouth daily.     . hydrocortisone (ANUSOL-HC) 2.5 % rectal cream Anusol-HC 2.5 % topical cream with perineal applicator  APPLY A  THIN LAYER TO THE AFFECTED AREA(S) BY TOPICAL ROUTE 2-4 TIMESDAILY    . hydrocortisone 2.5 % cream hydrocortisone 2.5 % topical cream with perineal applicator  APPLY THIN LAYER EXTERNALLY TO THE AFFECTED AREA 2 TO 4 TIMES DAILY    . ibuprofen (ADVIL) 800 MG tablet TAKE 1 TABLET BY MOUTH EVERY 8 HOURS AS NEEDED 90 tablet 0  . Multiple Vitamins-Minerals (ADULT GUMMY) CHEW Chew 3 capsules by mouth daily.    . nabumetone (RELAFEN) 750 MG tablet Take 1 tablet (750 mg total) by mouth 2 (two) times daily as needed. 60 tablet 6  . ondansetron (ZOFRAN) 8 MG tablet Take 1 tablet (8 mg total) by mouth 2 (two) times daily as needed (Nausea or vomiting). 30 tablet 1  . prochlorperazine (COMPAZINE) 10 MG tablet Take 1 tablet (10 mg total) by mouth every 6 (six) hours as needed (Nausea or vomiting). 30 tablet 1  . RECTICARE 5 % CREA APPLY EXTERNALLY TO THE AFFECTED AREA FOUR TIMES DAILY FOR 14 DAYS AS NEEDED    . TRIUMEQ 600-50-300 MG tablet TAKE 1 TABLET BY MOUTH DAILY 30 tablet 3  . TURMERIC PO Take by mouth daily.    Marland Kitchen zolpidem (AMBIEN) 10 MG tablet Take 1 tablet (10 mg total) by mouth at bedtime as needed. 30 tablet 1   No current facility-administered medications for this encounter.     REVIEW OF SYSTEMS: On review of systems, the patient reports that she is doing well overall. She reports she has been managing her pain in the anal region during the day, but has trouble sleeping due to the pain. She denies any chest pain, shortness of breath, cough, fevers, chills, night sweats, unintended weight changes. She denies any bowel or bladder disturbances, and denies abdominal pain, nausea or vomiting. She denies any new musculoskeletal or joint aches or pains. A complete review of systems is obtained and is otherwise negative.     PHYSICAL EXAM:  Wt Readings from Last 3 Encounters:  07/26/19 167 lb 11.2 oz (76.1 kg)  07/19/19 170 lb (77.1 kg)  07/14/19 170 lb 9.6 oz (77.4 kg)   In general this is a well  appearing African-American female in no acute distress.  She's alert and oriented x4 and appropriate throughout the examination. Cardiopulmonary assessment is negative for acute distress and she exhibits normal effort.     ECOG = 1  0 - Asymptomatic (Fully active, able to carry on all predisease activities without restriction)  1 - Symptomatic but completely ambulatory (Restricted in physically strenuous activity but ambulatory and able to carry out work of a light or sedentary nature. For example, light housework, office work)  2 - Symptomatic, <50% in bed during the day (Ambulatory and capable of all self care but unable to carry out any  work activities. Up and about more than 50% of waking hours)  3 - Symptomatic, >50% in bed, but not bedbound (Capable of only limited self-care, confined to bed or chair 50% or more of waking hours)  4 - Bedbound (Completely disabled. Cannot carry on any self-care. Totally confined to bed or chair)  5 - Death   Eustace Pen MM, Creech RH, Tormey DC, et al. [phone]). "Toxicity and response criteria of the [facility]". [place] Oncol. 5 (6): 649-55    LABORATORY DATA:  Lab Results  Component Value Date   WBC 7.0 02/07/2019   HGB 12.4 02/07/2019   HCT 36.8 02/07/2019   MCV 88.9 02/07/2019   PLT 199 02/07/2019   Lab Results  Component Value Date   NA 143 02/07/2019   K 3.4 (L) 02/07/2019   CL 107 02/07/2019   CO2 27 02/07/2019   Lab Results  Component Value Date   ALT 13 02/07/2019   AST 18 02/07/2019   ALKPHOS 57 02/16/2017   BILITOT 0.4 02/07/2019      RADIOGRAPHY: No results found.     IMPRESSION/PLAN: 1. 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 Dr. Lisbeth Renshaw discusses the pathology findings and reviews the nature of anal cancer.  He discusses that the treatment recommendations do not typically include surgical resection, rather chemoradiation would be recommended to treat the cancer, and that she be  followed with active surveillance with anoscopy.  Dr. Lisbeth Renshaw recommends proceeding with a PET scan for further extent of disease clarification, and she is scheduled for this next week.  We discussed the risks, benefits, short, and long term effects of radiotherapy, and the patient is interested in proceeding. Dr. Lisbeth Renshaw discusses the delivery and logistics of radiotherapy and anticipates a course of 6 weeks of radiotherapy.  She will return on the same day of her PET scan for simulation and at that time signed written consent to proceed. 2. Anal pain.  She will continue Anusol as needed and tylenol and advil. She is interested in having medication for night time and a new rx was sent to her pharmacy for [facility]. She will let us know if she has concerns but we reviewed the side effect profile of the medication and she is familiarly having had taken it previously following surgery. 3. History of HIV.  The patient will continue to follow-up with Dr. Baxter Flattery, her CD4 counts remain stable, and her viral load is undetectable.  This will be followed closely with Dr. Burr Medico as well during systemic therapy.  This encounter was provided by telemedicine platform MyChart.  The patient has provided two factor identification and has given verbal consent for this type of encounter and has been advised to only accept a meeting of this type in a secure network environment. The time spent during this encounter was 60 minutes including preparation, discussion, and coordination of the patient's care. The attendants for this meeting include Blenda Nicely, RN, Dr. Lisbeth Renshaw, Hayden Pedro  and Jae Dire.  During the encounter,  Blenda Nicely, RN, Dr. Lisbeth Renshaw, and Hayden Pedro were located at [facility] Radiation Oncology Department.  Vanessa Beard was located at home.   The above documentation reflects my direct findings during this shared patient visit. Please see the separate note by Dr. Lisbeth Renshaw on  this date for the remainder of the patient's plan of care.    Carola Rhine, PAC

## 2019-07-28 NOTE — Telephone Encounter (Signed)
Patient requesting refill on Nitroglycerine

## 2019-07-28 NOTE — Telephone Encounter (Signed)
Spoke to Dr. Loni Muse.  He is recommending the Recitcare but said she can have more Nitroglycerine gel 0.125% if it is helpful.  Called and spoke to pt.  She said she uses both.  She gets Recticare OTC and asked that we send more nitroglycerine to [facility]. Refill sent.

## 2019-07-28 NOTE — Telephone Encounter (Signed)
[place] Progress Notes  Patient enrolled in [place] assistance - initial [phone] disbursement will be left at registration desk for her to pick up today.  She can use these funds to get her medications at [facility].  Edwyna Shell, LCSW Clinical Social Worker Phone:  [phone] Cell:  [phone]

## 2019-07-28 NOTE — Telephone Encounter (Signed)
Pt requested a refill for nitroglycerine sent to [facility].

## 2019-07-29 ENCOUNTER — Telehealth: Payer: Self-pay | Admitting: Gastroenterology

## 2019-07-29 NOTE — Telephone Encounter (Signed)
Patient states she was having a hard time getting recti-care at her local [facility] pharmacy. She is aware its over the counter and will just try a different pharmacy

## 2019-08-01 ENCOUNTER — Other Ambulatory Visit: Payer: Self-pay | Admitting: Hematology

## 2019-08-01 DIAGNOSIS — C210 Malignant neoplasm of anus, unspecified: Secondary | ICD-10-CM

## 2019-08-01 MED ORDER — AMBULATORY NON FORMULARY MEDICATION: 1 refills | Status: DC

## 2019-08-01 NOTE — Telephone Encounter (Signed)
Sent script to [facility].

## 2019-08-02 ENCOUNTER — Ambulatory Visit (HOSPITAL_COMMUNITY): Payer: Medicare HMO

## 2019-08-03 ENCOUNTER — Inpatient Hospital Stay: Payer: Medicare HMO

## 2019-08-03 ENCOUNTER — Telehealth: Payer: Self-pay | Admitting: *Deleted

## 2019-08-04 ENCOUNTER — Ambulatory Visit: Payer: Medicare HMO | Admitting: Radiation Oncology

## 2019-08-04 ENCOUNTER — Ambulatory Visit (HOSPITAL_COMMUNITY)
Admission: RE | Admit: 2019-08-04 | Discharge: 2019-08-04 | Disposition: A | Discharge status: Home / Self Care | Payer: Medicare HMO | Source: Ambulatory Visit | Attending: Hematology | Admitting: Hematology

## 2019-08-04 ENCOUNTER — Other Ambulatory Visit: Payer: Self-pay

## 2019-08-04 DIAGNOSIS — R918 Other nonspecific abnormal finding of lung field: Secondary | ICD-10-CM | POA: Diagnosis not present

## 2019-08-04 DIAGNOSIS — Z79899 Other long term (current) drug therapy: Secondary | ICD-10-CM | POA: Diagnosis not present

## 2019-08-04 DIAGNOSIS — C210 Malignant neoplasm of anus, unspecified: Secondary | ICD-10-CM | POA: Insufficient documentation

## 2019-08-04 LAB — GLUCOSE, CAPILLARY: Glucose-Capillary: 97 mg/dL (ref 70–99)

## 2019-08-04 MED ORDER — FLUDEOXYGLUCOSE F - 18 (FDG) INJECTION
8.2000 | Freq: Once | INTRAVENOUS | Status: AC
  Administered 2019-08-04: 8.2 via INTRAVENOUS

## 2019-08-05 ENCOUNTER — Ambulatory Visit
Admission: RE | Admit: 2019-08-05 | Discharge: 2019-08-05 | Disposition: A | Discharge status: Home / Self Care | Payer: Medicare HMO | Source: Ambulatory Visit | Attending: Radiation Oncology | Admitting: Radiation Oncology

## 2019-08-05 ENCOUNTER — Other Ambulatory Visit: Payer: Self-pay

## 2019-08-05 DIAGNOSIS — C20 Malignant neoplasm of rectum: Secondary | ICD-10-CM | POA: Insufficient documentation

## 2019-08-05 DIAGNOSIS — Z510 Encounter for antineoplastic radiation therapy: Secondary | ICD-10-CM | POA: Insufficient documentation

## 2019-08-05 DIAGNOSIS — C210 Malignant neoplasm of anus, unspecified: Secondary | ICD-10-CM | POA: Diagnosis not present

## 2019-08-05 NOTE — Progress Notes (Signed)
Vanessa Beard   Telephone:([phone]) [phone] Fax:(336) [phone]   Clinic Follow up Note   Patient Care Team: Seward Carol, MD as PCP - General (Internal Medicine) Carlyle Basques, MD as PCP - Infectious Diseases (Infectious Diseases) Binnie Rail, DC as Referring Physician (Chiropractic Medicine) Jonnie Finner, RN as Oncology Nurse Navigator  Date of Service:  08/12/2019  CHIEF COMPLAINT: F/u of anal cancer   SUMMARY OF ONCOLOGIC HISTORY: Oncology History Overview Note  Cancer Staging Anal cancer ([place]) Staging form: Anus, AJCC 8th Edition - Clinical: Stage IV (cT2, cN0, cM1) - Signed by Truitt Merle, MD on 08/12/2019    Anal cancer ([place])  07/19/2019 Procedure   Colonoscopy by Dr Havery Moros 07/19/19  IMPRESSION - Ulcer noted at the anal canal in posterior midline canal that extends into the distal rectum on digital rectal exam, nodular and somewhat hard to palpation. Endoscopic images show nodular tissue in the area, concerning for malignant ulcer. - The examined portion of the ileum was normal. - One 3 mm polyp at the hepatic flexure, removed with a cold snare. Resected and retrieved. - One 5 mm polyp in the transverse colon, removed with a cold snare. Resected and retrieved. - Diverticulosis in the ascending colon. - The examination was otherwise normal.   07/19/2019 Initial Biopsy   Diagnosis 07/19/19 1. Transverse Colon Polyp, hepatic flexure (2) - TUBULAR ADENOMA (1 OF 3 FRAGMENTS) - BENIGN COLONIC MUCOSA (2 OF 3 FRAGMENTS) - NO HIGH GRADE DYSPLASIA OR MALIGNANCY IDENTIFIED 2. Rectum, biopsy, distal rectal anal canal - SQUAMOUS CELL CARCINOMA - SEE COMMENT   07/26/2019 Initial Diagnosis   Anal cancer ([place])   07/26/2019 Cancer Staging   Staging form: Anus, AJCC 8th Edition - Clinical: Stage IV (cT2, cN0, cM1) - Signed by Truitt Merle, MD on 08/12/2019   08/04/2019 PET scan   IMPRESSION: Hypermetabolic anal soft tissue mass, consistent with known primary anal  carcinoma.   Sub-cm hypermetabolic lymph nodes in posterior perirectal space, right inguinal region, right iliac chain, and gastrohepatic ligament, suspicious for metastatic disease.   Small hypermetabolic left axillary lymph nodes. This would be unusual location for metastatic anal carcinoma. Recommend clinical correlation for possibility the patient has had recent COVID vaccination in the left arm, which could explain this finding.   Multifocal airspace disease in both lower lungs with marked hypermetabolic activity. This favors infectious or inflammatory etiology, and is not typical for pulmonary metastases. Short-term follow-up by chest CT is recommended.   08/15/2019 -  Radiation Therapy   Concurrent ChemoRT with Dr. Lisbeth Renshaw starting 08/15/19   08/15/2019 -  Chemotherapy   Concurrent ChemoRT with Mitomycin and 5FU on week 1 and 5 starting 08/15/19       CURRENT THERAPY:  Concurrent chemoRT with Mitomycin and 5FU on week 1 and 5 for 6 weeks starting 08/15/19.    INTERVAL HISTORY:  Vanessa Beard is here for a follow up. She presents to the clinic alone. She notes still having pain in her rectum but manageable. She has been seen by Rad Onc and is having Chemo education class today. She is prepared to start treatment next week. She notes she is willing to get a biopsy of concerning uptake but is apprehensive about needles if she is awake.      REVIEW OF SYSTEMS:   Constitutional: Denies fevers, chills or abnormal weight loss Eyes: Denies blurriness of vision Ears, nose, mouth, throat, and face: Denies mucositis or sore throat Respiratory: Denies cough, dyspnea or wheezes  Cardiovascular: Denies palpitation, chest discomfort or lower extremity swelling Gastrointestinal:  Denies nausea, heartburn or change in bowel habits Skin: Denies abnormal skin rashes Lymphatics: Denies new lymphadenopathy or easy bruising Neurological:Denies numbness, tingling or new  weaknesses Behavioral/Psych: Mood is stable, no new changes  All other systems were reviewed with the patient and are negative.  MEDICAL HISTORY:  Past Medical History:  Diagnosis Date  . Allergic rhinitis   . Biliary colic   . Gall bladder disease \  . Hepatitis C   . Herpes   . HIV infection ([place])   . Hyperparathyroidism ([place])   . Hypertension   . Insomnia   . Osteoporosis   . Pneumonia    2010    SURGICAL HISTORY: Past Surgical History:  Procedure Laterality Date  . CHOLECYSTECTOMY N/A 01/09/2015   Procedure: LAPAROSCOPIC CHOLECYSTECTOMY;  Surgeon: [name], MD;  Location: [facility];  Service: General;  Laterality: N/A;  . ECTOPIC PREGNANCY SURGERY    . ORIF TIBIA PLATEAU Right 02/18/2017   Procedure: OPEN REDUCTION INTERNAL FIXATION (ORIF) RIGHT TIBIAL PLATEAU;  Surgeon: Leandrew Koyanagi, MD;  Location: [facility];  Service: Orthopedics;  Laterality: Right;  . SHOULDER SURGERY Right 12/2014    I have reviewed the social history and family history with the patient and they are unchanged from previous note.  ALLERGIES:  is allergic to dapsone; retrovir [zidovudine]; and sulfamethoxazole-trimethoprim.  MEDICATIONS:  Current Outpatient Medications  Medication Sig Dispense Refill  . AMBULATORY NON FORMULARY MEDICATION Nitroglycerine ointment 0.125 %  Apply a pea sized amount internally four times daily. 30 g 1  . amLODipine (NORVASC) 10 MG tablet Take 1 tablet (10 mg total) by mouth daily. 90 tablet 3  . calcium carbonate (TUMS - DOSED IN MG ELEMENTAL CALCIUM) 500 MG chewable tablet Chew 1 tablet by mouth as needed for indigestion or heartburn.    Marland Kitchen HYDROcodone-acetaminophen (NORCO) 5-325 MG tablet Take 1-2 tablets by mouth every 6 (six) hours as needed for moderate pain. 60 tablet 0  . hydrocortisone (ANUSOL-HC) 2.5 % rectal cream Anusol-HC 2.5 % topical cream with perineal applicator  APPLY A THIN LAYER TO THE AFFECTED AREA(S) BY TOPICAL ROUTE 2-4 TIMESDAILY    . hydrocortisone  2.5 % cream hydrocortisone 2.5 % topical cream with perineal applicator  APPLY THIN LAYER EXTERNALLY TO THE AFFECTED AREA 2 TO 4 TIMES DAILY    . ibuprofen (ADVIL) 800 MG tablet TAKE 1 TABLET BY MOUTH EVERY 8 HOURS AS NEEDED 90 tablet 0  . TRIUMEQ 600-50-300 MG tablet TAKE 1 TABLET BY MOUTH DAILY 30 tablet 3  . zolpidem (AMBIEN) 10 MG tablet Take 1 tablet (10 mg total) by mouth at bedtime as needed. 30 tablet 1  . amoxicillin (AMOXIL) 500 MG capsule amoxicillin 500 mg capsule  TAKE 1 CAPSULE BY MOUTH 3 TIMES DAILY FOR 10 DAYS FOR DENTAL INFECTION    . diazepam (VALIUM) 5 MG tablet diazepam 5 mg tablet    . ferrous sulfate 325 (65 FE) MG tablet Take 325 mg by mouth daily.     . Multiple Vitamins-Minerals (ADULT GUMMY) CHEW Chew 3 capsules by mouth daily.    . ondansetron (ZOFRAN) 8 MG tablet Take 1 tablet (8 mg total) by mouth 2 (two) times daily as needed (Nausea or vomiting). (Patient not taking: Reported on 08/12/2019) 30 tablet 1  . prochlorperazine (COMPAZINE) 10 MG tablet Take 1 tablet (10 mg total) by mouth every 6 (six) hours as needed (Nausea or vomiting). (Patient not taking: Reported on  08/12/2019) 30 tablet 1  . RECTICARE 5 % CREA APPLY EXTERNALLY TO THE AFFECTED AREA FOUR TIMES DAILY FOR 14 DAYS AS NEEDED     No current facility-administered medications for this visit.    PHYSICAL EXAMINATION: ECOG PERFORMANCE STATUS: 1 - Symptomatic but completely ambulatory  Vitals:   08/12/19 1555  BP: (!) 159/91  Pulse: 78  Resp: 20  Temp: 98.5 F (36.9 C)  SpO2: 96%   Filed Weights   08/12/19 1555  Weight: 167 lb 11.2 oz (76.1 kg)    GENERAL:alert, no distress and comfortable SKIN: skin color, texture, turgor are normal, no rashes or significant lesions EYES: normal, Conjunctiva are pink and non-injected, sclera clear  NECK: supple, thyroid normal size, non-tender, without nodularity LYMPH:  no palpable lymphadenopathy in the cervical, axillary  LUNGS: clear to auscultation and  percussion with normal breathing effort HEART: regular rate & rhythm and no murmurs and no lower extremity edema ABDOMEN:abdomen soft, non-tender and normal bowel sounds Musculoskeletal:no cyanosis of digits and no clubbing  NEURO: alert & oriented x 3 with fluent speech, no focal motor/sensory deficits RECTAL: (+) rectal mass at anal canal visible externally    LABORATORY DATA:  I have reviewed the data as listed CBC Latest Ref Rng & Units 08/08/2019 02/07/2019 02/07/2019  WBC 3.8 - 10.8 Thousand/uL 5.7 7.0 7.1  Hemoglobin 11.7 - 15.5 g/dL 11.9 12.4 12.5  Hematocrit 35.0 - 45.0 % 36.9 36.8 37.8  Platelets 140 - 400 Thousand/uL 222 199 191     CMP Latest Ref Rng & Units 08/08/2019 02/07/2019 10/04/2018  Glucose 65 - 99 mg/dL 92 109(H) 88  BUN 7 - 25 mg/dL 15 21 21   Creatinine 0.60 - 0.93 mg/dL 0.87 1.04(H) 0.98  Sodium 135 - 146 mmol/L 139 143 141  Potassium 3.5 - 5.3 mmol/L 3.6 3.4(L) 3.7  Chloride 98 - 110 mmol/L 102 107 108  CO2 20 - 32 mmol/L 28 27 25   Calcium 8.6 - 10.4 mg/dL 10.0 9.5 9.7  Total Protein 6.1 - 8.1 g/dL 8.1 7.0 7.8  Total Bilirubin 0.2 - 1.2 mg/dL 0.5 0.4 0.6  Alkaline Phos 38 - 126 U/L - - -  AST 10 - 35 U/L 23 18 21   ALT 6 - 29 U/L 10 13 10       RADIOGRAPHIC STUDIES: I have personally reviewed the radiological images as listed and agreed with the findings in the report. No results found.   ASSESSMENT & PLAN:  Vanessa Beard is a 69 y.o. female with    1. 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 Based on the description on colonoscopy report, she has more than 2 cm long mass in her low rectum anal canal. Her biopsy results show Squamous Cell Carcinoma. This is consistent with anal cancer.  She has HIV, which is a high risk for anal cancer. -I personally reviewed and discussed her PET from 08/04/19 which showed hypermetabolic anal mass,hypermetabolic  LNs in posterior perirectal space, right inguinal region, right iliac chain and gastrohepatic ligament suspicious for metastasis. Scan also showed hypermetabolic left axillary LNs, which is unusual metastatic pattern. She denies recent injury or injections in the left arm. I recommend biopsy of axillary LN or EUS gastrohepatic node to rule out distant metastasis. Given she rather be put under she rather proceed with Endoscopy biopsy first. I will refer her to Dr. Ardis Hughs  -Also metastatic disease is not ruled out  at this point, Dr. Lisbeth Renshaw is able to cover the gastrohepatic lymph node in the radiation field, I recommended chemotherapy with Mitomycin and 5FU on week 1 and 5, which is the standard therapy for definitive anal cancer therapy. Plan to start 08/15/19. She will have PICC line placed on first day of chemo on week 1 and 5.  --Chemotherapy consent: Side effects including but does not not limited to, fatigue, nausea, vomiting, diarrhea, hair loss, neuropathy, fluid retention, renal and kidney dysfunction, neutropenic fever, needed for blood transfusion, bleeding, were discussed with patient in great detail. She agrees to proceed. -The goal of therapy is curative -I plan to repeat CT chest to evaluate her left axillary lymph node after she completes chemoradiation. -chemo class today.   -Will f/u with her weekly on Mondays    2. Hep C, HIV -She underwent successful Hep C treatment 2 years ago (in 2018) -Her HIV was contracted through sex. Her HIV is well controlled. She will continue to f/u ID Dr Graylon Good. Last CD4 count was 386 on 02/07/2019.    3. Comorbidities: HTN, Hyperparathyroidism, Osteoporosis  -Continue medications and f/u with PCP.    4. Social Support  -She is divorced, lives alone and has 2 adult children who live in [place]  -She is getting bachelors degree in Criminal Justice through Online schooling, she plans to complete in 10/2019 but may postpone her final classes.  -She  is currently working at [employer] as [employer] and works 8-9 hours every day. She notes her work is flexible to take time off if needed.     PLAN:  -PET scan reviewed with her. Will refer her to Dr. Ardis Hughs for EUS biopsy of gastrohepatic node  -Chemo Education Class today  -PICC line, lab and chemo Mitomycin and 5FU and RT on 08/15/19, PICC line removal on 4/2 -weekly lab and f/u when she is on chemoRT   No problem-specific Assessment & Plan notes found for this encounter.   No orders of the defined types were placed in this encounter.  All questions were answered. The patient knows to call the clinic with any problems, questions or concerns. No barriers to learning was detected. The total time spent in the appointment was 30 minutes.     Truitt Merle, MD 08/12/2019   I, Joslyn Devon, am acting as scribe for Truitt Merle, MD.   I have reviewed the above documentation for accuracy and completeness, and I agree with the above.

## 2019-08-08 ENCOUNTER — Other Ambulatory Visit: Payer: Self-pay | Admitting: *Deleted

## 2019-08-08 ENCOUNTER — Other Ambulatory Visit: Payer: Medicare HMO

## 2019-08-08 ENCOUNTER — Inpatient Hospital Stay: Payer: Medicare HMO | Admitting: Hematology

## 2019-08-08 ENCOUNTER — Other Ambulatory Visit: Payer: Self-pay

## 2019-08-08 ENCOUNTER — Other Ambulatory Visit (HOSPITAL_COMMUNITY): Payer: Medicare HMO

## 2019-08-08 ENCOUNTER — Ambulatory Visit: Payer: Medicare HMO | Admitting: Nurse Practitioner

## 2019-08-08 ENCOUNTER — Inpatient Hospital Stay: Payer: Medicare HMO | Admitting: Nutrition

## 2019-08-08 ENCOUNTER — Inpatient Hospital Stay: Payer: Medicare HMO

## 2019-08-08 DIAGNOSIS — R69 Illness, unspecified: Secondary | ICD-10-CM | POA: Diagnosis not present

## 2019-08-08 DIAGNOSIS — B20 Human immunodeficiency virus [HIV] disease: Secondary | ICD-10-CM

## 2019-08-09 LAB — T-HELPER CELL (CD4) - (RCID CLINIC ONLY)
CD4 % Helper T Cell: 19 % — ABNORMAL LOW (ref 33–65)
CD4 T Cell Abs: 237 /uL — ABNORMAL LOW (ref 400–1790)

## 2019-08-10 ENCOUNTER — Encounter: Payer: Self-pay | Admitting: Radiation Oncology

## 2019-08-10 ENCOUNTER — Other Ambulatory Visit: Payer: Self-pay

## 2019-08-10 LAB — COMPLETE METABOLIC PANEL WITH GFR
AG Ratio: 0.9 (calc) — ABNORMAL LOW (ref 1.0–2.5)
ALT: 10 U/L (ref 6–29)
AST: 23 U/L (ref 10–35)
Albumin: 3.9 g/dL (ref 3.6–5.1)
Alkaline phosphatase (APISO): 86 U/L (ref 37–153)
BUN: 15 mg/dL (ref 7–25)
CO2: 28 mmol/L (ref 20–32)
Calcium: 10 mg/dL (ref 8.6–10.4)
Chloride: 102 mmol/L (ref 98–110)
Creat: 0.87 mg/dL (ref 0.60–0.93)
GFR, Est African American: 78 mL/min/{1.73_m2} (ref >=60)
GFR, Est Non African American: 67 mL/min/{1.73_m2} (ref >=60)
Globulin: 4.2 g/dL (calc) — ABNORMAL HIGH (ref 1.9–3.7)
Glucose, Bld: 92 mg/dL (ref 65–99)
Potassium: 3.6 mmol/L (ref 3.5–5.3)
Sodium: 139 mmol/L (ref 135–146)
Total Bilirubin: 0.5 mg/dL (ref 0.2–1.2)
Total Protein: 8.1 g/dL (ref 6.1–8.1)

## 2019-08-10 LAB — CBC WITH DIFFERENTIAL/PLATELET
Absolute Monocytes: 439 cells/uL (ref 200–950)
Basophils Absolute: 29 cells/uL (ref 0–200)
Basophils Relative: 0.5 %
Eosinophils Absolute: 97 cells/uL (ref 15–500)
Eosinophils Relative: 1.7 %
HCT: 36.9 % (ref 35.0–45.0)
Hemoglobin: 11.9 g/dL (ref 11.7–15.5)
Lymphs Abs: 1265 cells/uL (ref 850–3900)
MCH: 29 pg (ref 27.0–33.0)
MCHC: 32.2 g/dL (ref 32.0–36.0)
MCV: 90 fL (ref 80.0–100.0)
MPV: 11.3 fL (ref 7.5–12.5)
Monocytes Relative: 7.7 %
Neutro Abs: 3870 cells/uL (ref 1500–7800)
Neutrophils Relative %: 67.9 %
Platelets: 222 10*3/uL (ref 140–400)
RBC: 4.1 10*6/uL (ref 3.80–5.10)
RDW: 13.1 % (ref 11.0–15.0)
Total Lymphocyte: 22.2 %
WBC: 5.7 10*3/uL (ref 3.8–10.8)

## 2019-08-10 LAB — HIV-1 RNA QUANT-NO REFLEX-BLD
HIV 1 RNA Quant: <20 copies/mL — AB
HIV-1 RNA Quant, Log: <1.3 Log copies/mL — AB

## 2019-08-10 NOTE — Progress Notes (Signed)
The patient's case was discussed in GI conference this morning and follow up discussion with Dr. Burr Medico and Dr. Lisbeth Renshaw. She had PET positive gastrohepatic node and a left axillary node seen. Discussion was to include the gastrohepatic node as a separate isocenter while continuing the plan to treat the anal tumor and regional nodes. She will however still proceed with GI evaluation to consider gastrohepatic node biopsy with EUS. But that with concern for being able to retrieve a representative sample, even in a situation of negative results, Dr. Lisbeth Renshaw would recommend treating what appears by PET to treat this site, and to follow the left axilla and consider further resection or radiotherapy to the left axilla if the finding persists after therapy.     Carola Rhine, PAC

## 2019-08-11 ENCOUNTER — Other Ambulatory Visit: Payer: Self-pay

## 2019-08-11 ENCOUNTER — Ambulatory Visit
Admission: RE | Admit: 2019-08-11 | Discharge: 2019-08-11 | Disposition: A | Discharge status: Home / Self Care | Payer: Medicare HMO | Source: Ambulatory Visit | Attending: Radiation Oncology | Admitting: Radiation Oncology

## 2019-08-11 ENCOUNTER — Encounter: Payer: Self-pay | Admitting: Radiation Oncology

## 2019-08-11 DIAGNOSIS — C20 Malignant neoplasm of rectum: Secondary | ICD-10-CM | POA: Diagnosis not present

## 2019-08-11 DIAGNOSIS — C210 Malignant neoplasm of anus, unspecified: Secondary | ICD-10-CM | POA: Diagnosis not present

## 2019-08-11 DIAGNOSIS — Z510 Encounter for antineoplastic radiation therapy: Secondary | ICD-10-CM | POA: Diagnosis not present

## 2019-08-12 ENCOUNTER — Encounter: Payer: Self-pay | Admitting: Hematology

## 2019-08-12 ENCOUNTER — Inpatient Hospital Stay: Payer: Medicare HMO

## 2019-08-12 ENCOUNTER — Inpatient Hospital Stay (HOSPITAL_BASED_OUTPATIENT_CLINIC_OR_DEPARTMENT_OTHER): Payer: Medicare HMO | Admitting: Hematology

## 2019-08-12 ENCOUNTER — Other Ambulatory Visit: Payer: Self-pay | Admitting: Medical Oncology

## 2019-08-12 ENCOUNTER — Encounter: Payer: Self-pay | Admitting: Medical Oncology

## 2019-08-12 ENCOUNTER — Telehealth: Payer: Self-pay

## 2019-08-12 ENCOUNTER — Other Ambulatory Visit: Payer: Self-pay

## 2019-08-12 ENCOUNTER — Telehealth: Payer: Self-pay | Admitting: Hematology

## 2019-08-12 VITALS — BP 159/91 | HR 78 | Temp 98.5°F | Resp 20 | Ht 60.0 in | Wt 167.7 lb

## 2019-08-12 DIAGNOSIS — K5730 Diverticulosis of large intestine without perforation or abscess without bleeding: Secondary | ICD-10-CM | POA: Diagnosis not present

## 2019-08-12 DIAGNOSIS — R69 Illness, unspecified: Secondary | ICD-10-CM | POA: Diagnosis not present

## 2019-08-12 DIAGNOSIS — Z5111 Encounter for antineoplastic chemotherapy: Secondary | ICD-10-CM | POA: Diagnosis not present

## 2019-08-12 DIAGNOSIS — C210 Malignant neoplasm of anus, unspecified: Secondary | ICD-10-CM | POA: Diagnosis not present

## 2019-08-12 DIAGNOSIS — M810 Age-related osteoporosis without current pathological fracture: Secondary | ICD-10-CM | POA: Diagnosis not present

## 2019-08-12 DIAGNOSIS — J984 Other disorders of lung: Secondary | ICD-10-CM | POA: Diagnosis not present

## 2019-08-12 DIAGNOSIS — D123 Benign neoplasm of transverse colon: Secondary | ICD-10-CM | POA: Diagnosis not present

## 2019-08-12 DIAGNOSIS — C211 Malignant neoplasm of anal canal: Secondary | ICD-10-CM | POA: Diagnosis not present

## 2019-08-12 DIAGNOSIS — I10 Essential (primary) hypertension: Secondary | ICD-10-CM | POA: Diagnosis not present

## 2019-08-12 DIAGNOSIS — E213 Hyperparathyroidism, unspecified: Secondary | ICD-10-CM | POA: Diagnosis not present

## 2019-08-12 NOTE — Telephone Encounter (Signed)
Attempted to contact Ms Vierling regarding today's missed appt.  I left vm asking her to call.

## 2019-08-12 NOTE — Telephone Encounter (Signed)
   ANGELETE FERNICOLA DOB: June 08, 1950 MRN: [id number]   RIDER WAIVER AND RELEASE OF LIABILITY  For purposes of improving physical access to our facilities, [facility] is pleased to partner with third parties to provide [facility] patients or other authorized individuals the option of convenient, on-demand ground transportation services (the [place]") through use of the technology service that enables users to request on-demand ground transportation from independent third-party providers.  By opting to use and accept these [employer], I, the undersigned, hereby agree on behalf of myself, and on behalf of any minor child using the [employer] for whom I am the parent or legal guardian, as follows:  1. Government social research officer provided to me are provided by independent third-party transportation providers who are not [employer] or employees and who are unaffiliated with [employer]. 2. [facility] is neither a transportation carrier nor a common or public carrier. 3. [facility] has no control over the quality or safety of the transportation that occurs as a result of the [employer]. 4. [facility] cannot guarantee that any third-party transportation provider will complete any arranged transportation service. 5. [facility] makes no representation, warranty, or guarantee regarding the reliability, timeliness, quality, safety, suitability, or availability of any of the Transport Services or that they will be error free. 6. I fully understand that traveling by vehicle involves risks and dangers of serious bodily injury, including permanent disability, paralysis, and death. I agree, on behalf of myself and on behalf of any minor child using the Transport Services for whom I am the parent or legal guardian, that the entire risk arising out of my use of the [employer] remains solely with me, to the maximum extent permitted under applicable law. 7. The [employer] are provided "as is" and "as available." [facility] disclaims all representations and warranties, express, implied or statutory, not expressly set out in these terms, including the implied warranties of merchantability and fitness for a particular purpose. 8. I hereby waive and release [facility], its agents, employees, officers, directors, representatives, insurers, attorneys, assigns, successors, subsidiaries, and affiliates from any and all past, present, or future claims, demands, liabilities, actions, causes of action, or suits of any kind directly or indirectly arising from acceptance and use of the [employer]. 9. I further waive and release [facility] and its affiliates from all present and future liability and responsibility for any injury or death to persons or damages to property caused by or related to the use of the [employer]. 10. I have read this Waiver and Release of Liability, and I understand the terms used in it and their legal significance. This Waiver is freely and voluntarily given with the understanding that my right (as well as the right of any minor child for whom I am the parent or legal guardian using the [employer]) to legal recourse against [facility] in connection with the [employer] is knowingly surrendered in return for use of these services.   I attest that I read the consent document to Jae Dire, gave Ms. Elman the opportunity to ask questions and answered the questions asked (if any). I affirm that Jae Dire then provided consent for she's participation in this program.     Ginette Otto

## 2019-08-15 ENCOUNTER — Inpatient Hospital Stay: Payer: Medicare HMO | Admitting: Nutrition

## 2019-08-15 ENCOUNTER — Other Ambulatory Visit: Payer: Self-pay

## 2019-08-15 ENCOUNTER — Telehealth: Payer: Self-pay

## 2019-08-15 ENCOUNTER — Telehealth: Payer: Self-pay | Admitting: Hematology

## 2019-08-15 ENCOUNTER — Inpatient Hospital Stay: Payer: Medicare HMO

## 2019-08-15 ENCOUNTER — Ambulatory Visit (HOSPITAL_COMMUNITY)
Admission: RE | Admit: 2019-08-15 | Discharge: 2019-08-15 | Disposition: A | Discharge status: Home / Self Care | Payer: Medicare HMO | Source: Ambulatory Visit | Attending: Hematology | Admitting: Hematology

## 2019-08-15 ENCOUNTER — Ambulatory Visit
Admission: RE | Admit: 2019-08-15 | Discharge: 2019-08-15 | Disposition: A | Discharge status: Home / Self Care | Payer: Medicare HMO | Source: Ambulatory Visit | Attending: Radiation Oncology | Admitting: Radiation Oncology

## 2019-08-15 ENCOUNTER — Encounter: Payer: Self-pay | Admitting: Hematology

## 2019-08-15 VITALS — BP 127/82 | HR 79 | Temp 98.5°F | Resp 18

## 2019-08-15 DIAGNOSIS — D123 Benign neoplasm of transverse colon: Secondary | ICD-10-CM | POA: Diagnosis not present

## 2019-08-15 DIAGNOSIS — E213 Hyperparathyroidism, unspecified: Secondary | ICD-10-CM | POA: Diagnosis not present

## 2019-08-15 DIAGNOSIS — C211 Malignant neoplasm of anal canal: Secondary | ICD-10-CM | POA: Diagnosis not present

## 2019-08-15 DIAGNOSIS — R69 Illness, unspecified: Secondary | ICD-10-CM | POA: Diagnosis not present

## 2019-08-15 DIAGNOSIS — M810 Age-related osteoporosis without current pathological fracture: Secondary | ICD-10-CM | POA: Diagnosis not present

## 2019-08-15 DIAGNOSIS — C210 Malignant neoplasm of anus, unspecified: Secondary | ICD-10-CM | POA: Insufficient documentation

## 2019-08-15 DIAGNOSIS — Z5111 Encounter for antineoplastic chemotherapy: Secondary | ICD-10-CM | POA: Diagnosis not present

## 2019-08-15 DIAGNOSIS — C20 Malignant neoplasm of rectum: Secondary | ICD-10-CM | POA: Diagnosis not present

## 2019-08-15 DIAGNOSIS — J984 Other disorders of lung: Secondary | ICD-10-CM | POA: Diagnosis not present

## 2019-08-15 DIAGNOSIS — I10 Essential (primary) hypertension: Secondary | ICD-10-CM | POA: Diagnosis not present

## 2019-08-15 DIAGNOSIS — K5730 Diverticulosis of large intestine without perforation or abscess without bleeding: Secondary | ICD-10-CM | POA: Diagnosis not present

## 2019-08-15 DIAGNOSIS — C189 Malignant neoplasm of colon, unspecified: Secondary | ICD-10-CM | POA: Diagnosis not present

## 2019-08-15 DIAGNOSIS — Z510 Encounter for antineoplastic radiation therapy: Secondary | ICD-10-CM | POA: Diagnosis not present

## 2019-08-15 DIAGNOSIS — Z452 Encounter for adjustment and management of vascular access device: Secondary | ICD-10-CM | POA: Diagnosis not present

## 2019-08-15 HISTORY — PX: IR FLUORO GUIDE CV LINE LEFT: IMG2282

## 2019-08-15 LAB — CBC WITH DIFFERENTIAL (CANCER CENTER ONLY)
Abs Immature Granulocytes: 0.01 10*3/uL (ref 0.00–0.07)
Basophils Absolute: 0 10*3/uL (ref 0.0–0.1)
Basophils Relative: 0 %
Eosinophils Absolute: 0.1 10*3/uL (ref 0.0–0.5)
Eosinophils Relative: 2 %
HCT: 34.9 % — ABNORMAL LOW (ref 36.0–46.0)
Hemoglobin: 11.4 g/dL — ABNORMAL LOW (ref 12.0–15.0)
Immature Granulocytes: 0 %
Lymphocytes Relative: 13 %
Lymphs Abs: 0.8 10*3/uL (ref 0.7–4.0)
MCH: 30.2 pg (ref 26.0–34.0)
MCHC: 32.7 g/dL (ref 30.0–36.0)
MCV: 92.6 fL (ref 80.0–100.0)
Monocytes Absolute: 0.4 10*3/uL (ref 0.1–1.0)
Monocytes Relative: 7 %
Neutro Abs: 4.6 10*3/uL (ref 1.7–7.7)
Neutrophils Relative %: 78 %
Platelet Count: 185 10*3/uL (ref 150–400)
RBC: 3.77 MIL/uL — ABNORMAL LOW (ref 3.87–5.11)
RDW: 13.2 % (ref 11.5–15.5)
WBC Count: 5.9 10*3/uL (ref 4.0–10.5)
nRBC: 0 % (ref 0.0–0.2)

## 2019-08-15 LAB — CMP (CANCER CENTER ONLY)
ALT: 14 U/L (ref 0–44)
AST: 30 U/L (ref 15–41)
Albumin: 3.4 g/dL — ABNORMAL LOW (ref 3.5–5.0)
Alkaline Phosphatase: 89 U/L (ref 38–126)
Anion gap: 10 (ref 5–15)
BUN: 17 mg/dL (ref 8–23)
CO2: 23 mmol/L (ref 22–32)
Calcium: 9.4 mg/dL (ref 8.9–10.3)
Chloride: 111 mmol/L (ref 98–111)
Creatinine: 0.88 mg/dL (ref 0.44–1.00)
GFR, Est AFR Am: >60 mL/min (ref >60)
GFR, Estimated: >60 mL/min (ref >60)
Glucose, Bld: 101 mg/dL — ABNORMAL HIGH (ref 70–99)
Potassium: 3.5 mmol/L (ref 3.5–5.1)
Sodium: 144 mmol/L (ref 135–145)
Total Bilirubin: 0.7 mg/dL (ref 0.3–1.2)
Total Protein: 8.4 g/dL — ABNORMAL HIGH (ref 6.5–8.1)

## 2019-08-15 MED ORDER — LIDOCAINE HCL 1 % IJ SOLN
INTRAMUSCULAR | Status: DC | PRN
  Administered 2019-08-15: 5 mL via INTRADERMAL

## 2019-08-15 MED ORDER — MITOMYCIN CHEMO IV INJECTION 20 MG
10.0000 mg/m2 | Freq: Once | INTRAVENOUS | Status: AC
  Administered 2019-08-15: 18 mg via INTRAVENOUS
  Filled 2019-08-15: qty 36

## 2019-08-15 MED ORDER — SODIUM CHLORIDE 0.9 % IV SOLN
1000.0000 mg/m2/d | INTRAVENOUS | Status: DC
  Administered 2019-08-15: 7150 mg via INTRAVENOUS
  Filled 2019-08-15: qty 143

## 2019-08-15 MED ORDER — PROCHLORPERAZINE MALEATE 10 MG PO TABS
10.0000 mg | ORAL_TABLET | Freq: Once | ORAL | Status: AC
  Administered 2019-08-15: 10 mg via ORAL

## 2019-08-15 MED ORDER — SODIUM CHLORIDE 0.9 % IV SOLN
Freq: Once | INTRAVENOUS | Status: AC
  Filled 2019-08-15: qty 250

## 2019-08-15 MED ORDER — LIDOCAINE HCL 1 % IJ SOLN
INTRAMUSCULAR | Status: AC
  Filled 2019-08-15: qty 20

## 2019-08-15 MED ORDER — PROCHLORPERAZINE MALEATE 10 MG PO TABS
ORAL_TABLET | ORAL | Status: AC
  Filled 2019-08-15: qty 1

## 2019-08-15 NOTE — Progress Notes (Signed)
69 year old female diagnosed with anal cancer receiving chemo and radiation therapy.  She is a patient of Dr. Burr Medico.  Past medical history includes HIV, hepatitis C, osteoporosis, and gallbladder disease.  Medications include ferrous sulfate, multivitamin, Zofran, Compazine, and turmeric.  Labs were reviewed.  Height: 5 feet 0 inches. Weight: 167.7 pounds March 26. Usual body weight: 167 pounds per patient. BMI: 32.75.  Today is patient's first chemotherapy. She denies nutrition impact symptoms prior to today. MD order consult secondary to expecting patient to have nutrition impact symptoms with chemotherapy.  Nutrition diagnosis: Food and nutrition related knowledge deficit related to anal cancer and associated treatments as evidenced by no prior need for nutrition related information.  Intervention: Educated patient on the importance of smaller more frequent meals and snacks with adequate calories and protein. Encouraged weight maintenance. Brief education provided on eating if she develops diarrhea. Encouraged adequate fluid intake. Provided fact sheets and contact information.  Monitoring, evaluation, goals: Patient will tolerate adequate calories and protein for weight maintenance.  Next visit: Monday, April 26 during infusion.  **Disclaimer: This note was dictated with voice recognition software. Similar sounding words can inadvertently be transcribed and this note may contain transcription errors which may not have been corrected upon publication of note.**

## 2019-08-15 NOTE — Telephone Encounter (Signed)
Scheduled appt per 3/26 los.  Called infusion and they are going to print out an updated appt calendar for the pt.

## 2019-08-15 NOTE — Progress Notes (Signed)
Met w/ pt to introduce myself as her Arboriculturist.  Unfortunately there aren't any foundations offering copay assistance for her Dx and the type of ins she has.  I offered the [employer], went over what it covers, gave her the income requirement and an expense sheet.  She would like to review her income and get back to me if she qualifies.  She has my card for any questions or concerns she may have in the future.

## 2019-08-15 NOTE — Patient Instructions (Signed)
[facility] Discharge Instructions for Patients Receiving Chemotherapy  Today you received the following chemotherapy agents: Mitomycin, Fluorouracil.  To help prevent nausea and vomiting after your treatment, we encourage you to take your nausea medication as directed.    If you develop nausea and vomiting that is not controlled by your nausea medication, call the clinic.   BELOW ARE SYMPTOMS THAT SHOULD BE REPORTED IMMEDIATELY:  *FEVER GREATER THAN 100.5 F  *CHILLS WITH OR WITHOUT FEVER  NAUSEA AND VOMITING THAT IS NOT CONTROLLED WITH YOUR NAUSEA MEDICATION  *UNUSUAL SHORTNESS OF BREATH  *UNUSUAL BRUISING OR BLEEDING  TENDERNESS IN MOUTH AND THROAT WITH OR WITHOUT PRESENCE OF ULCERS  *URINARY PROBLEMS  *BOWEL PROBLEMS  UNUSUAL RASH Items with * indicate a potential emergency and should be followed up as soon as possible.  Feel free to call the clinic should you have any questions or concerns. The clinic phone number is [phone].  Please show the [facility] at check-in to the Emergency Department and triage nurse.  Mitomycin injection What is this medicine? MITOMYCIN (mye toe MYE sin) is a chemotherapy drug. This medicine is used to treat cancer of the stomach and pancreas. This medicine may be used for other purposes; ask your health care provider or pharmacist if you have questions. COMMON BRAND NAME(S): Mutamycin What should I tell my health care provider before I take this medicine? They need to know if you have any of these conditions:  bleeding disorders  infection (especially a viral infection such as chickenpox, cold sores, or herpes)  low blood counts, like Bradner cells, platelets, or red blood cells  kidney disease  an unusual or allergic reaction to mitomycin, other medicines, foods, dyes, or preservatives  pregnant or trying to get pregnant  breast-feeding How should I use this medicine? This drug is given as an injection  or infusion into a vein. It is administered in a hospital or clinic by a specially trained health care professional. Talk to your pediatrician regarding the use of this medicine in children. Special care may be needed. Overdosage: If you think you have taken too much of this medicine contact a poison control center or emergency room at once. NOTE: This medicine is only for you. Do not share this medicine with others. What if I miss a dose? It is important not to miss your dose. Call your doctor or health care professional if you are unable to keep an appointment. What may interact with this medicine? Interactions are not expected. This list may not describe all possible interactions. Give your health care provider a list of all the medicines, herbs, non-prescription drugs, or dietary supplements you use. Also tell them if you smoke, drink alcohol, or use illegal drugs. Some items may interact with your medicine. What should I watch for while using this medicine? Your condition will be monitored carefully while you are receiving this medicine. You will need important blood work done while you are taking this medicine. This drug may make you feel generally unwell. This is not uncommon, as chemotherapy can affect healthy cells as well as cancer cells. Report any side effects. Continue your course of treatment even though you feel ill unless your doctor tells you to stop. Call your doctor or health care professional for advice if you get a fever, chills or sore throat, or other symptoms of a cold or flu. Do not treat yourself. This drug decreases your body's ability to fight infections. Try to avoid being  around people who are sick. This medicine may increase your risk to bruise or bleed. Call your doctor or health care professional if you notice any unusual bleeding. Be careful brushing and flossing your teeth or using a toothpick because you may get an infection or bleed more easily. If you have any  dental work done, tell your dentist you are receiving this medicine. Avoid taking products that contain aspirin, acetaminophen, ibuprofen, naproxen, or ketoprofen unless instructed by your doctor. These medicines may hide a fever. Do not become pregnant while taking this medicine. Women should inform their doctor if they wish to become pregnant or think they might be pregnant. There is a potential for serious side effects to an unborn child. Talk to your health care professional or pharmacist for more information. Do not breast-feed an infant while taking this medicine. What side effects may I notice from receiving this medicine? Side effects that you should report to your doctor or health care professional as soon as possible:  allergic reactions like skin rash, itching or hives, swelling of the face, lips, or tongue  breathing problems  pain, redness, or irritation at site where injected  signs and symptoms of bleeding such as bloody or black, tarry stools; red or dark brown urine; spitting up blood or brown material that looks like coffee grounds; red spots on the skin; unusual bruising or bleeding from the eyes, gums, or nose  signs and symptoms of infection like fever; chills; cough; sore throat; pain or trouble passing urine  signs and symptoms of kidney injury like trouble passing urine or change in the amount of urine  signs and symptoms of low red blood cells or anemia such as unusually weak or tired; feeling faint or lightheaded; falls; breathing problems Side effects that usually do not require medical attention (report to your doctor or health care professional if they continue or are bothersome):  green to blue color of urine  hair loss  loss of appetite  mouth sores  nausea, vomiting This list may not describe all possible side effects. Call your doctor for medical advice about side effects. You may report side effects to FDA at 1-800-FDA-1088. Where should I keep my  medicine? This drug is given in a hospital or clinic and will not be stored at home. NOTE: This sheet is a summary. It may not cover all possible information. If you have questions about this medicine, talk to your doctor, pharmacist, or health care provider.  2020 Elsevier/Gold Standard (2018-12-21 16:33:50)   Fluorouracil, 5-FU injection What is this medicine? FLUOROURACIL, 5-FU (flure oh YOOR a sil) is a chemotherapy drug. It slows the growth of cancer cells. This medicine is used to treat many types of cancer like breast cancer, colon or rectal cancer, pancreatic cancer, and stomach cancer. This medicine may be used for other purposes; ask your health care provider or pharmacist if you have questions. COMMON BRAND NAME(S): Adrucil What should I tell my health care provider before I take this medicine? They need to know if you have any of these conditions:  blood disorders  dihydropyrimidine dehydrogenase (DPD) deficiency  infection (especially a virus infection such as chickenpox, cold sores, or herpes)  kidney disease  liver disease  malnourished, poor nutrition  recent or ongoing radiation therapy  an unusual or allergic reaction to fluorouracil, other chemotherapy, other medicines, foods, dyes, or preservatives  pregnant or trying to get pregnant  breast-feeding How should I use this medicine? This drug is given as an  infusion or injection into a vein. It is administered in a hospital or clinic by a specially trained health care professional. Talk to your pediatrician regarding the use of this medicine in children. Special care may be needed. Overdosage: If you think you have taken too much of this medicine contact a poison control center or emergency room at once. NOTE: This medicine is only for you. Do not share this medicine with others. What if I miss a dose? It is important not to miss your dose. Call your doctor or health care professional if you are unable to  keep an appointment. What may interact with this medicine?  allopurinol  cimetidine  dapsone  digoxin  hydroxyurea  leucovorin  levamisole  medicines for seizures like ethotoin, fosphenytoin, phenytoin  medicines to increase blood counts like filgrastim, pegfilgrastim, sargramostim  medicines that treat or prevent blood clots like warfarin, enoxaparin, and dalteparin  methotrexate  metronidazole  pyrimethamine  some other chemotherapy drugs like busulfan, cisplatin, estramustine, vinblastine  trimethoprim  trimetrexate  vaccines Talk to your doctor or health care professional before taking any of these medicines:  acetaminophen  aspirin  ibuprofen  ketoprofen  naproxen This list may not describe all possible interactions. Give your health care provider a list of all the medicines, herbs, non-prescription drugs, or dietary supplements you use. Also tell them if you smoke, drink alcohol, or use illegal drugs. Some items may interact with your medicine. What should I watch for while using this medicine? Visit your doctor for checks on your progress. This drug may make you feel generally unwell. This is not uncommon, as chemotherapy can affect healthy cells as well as cancer cells. Report any side effects. Continue your course of treatment even though you feel ill unless your doctor tells you to stop. In some cases, you may be given additional medicines to help with side effects. Follow all directions for their use. Call your doctor or health care professional for advice if you get a fever, chills or sore throat, or other symptoms of a cold or flu. Do not treat yourself. This drug decreases your body's ability to fight infections. Try to avoid being around people who are sick. This medicine may increase your risk to bruise or bleed. Call your doctor or health care professional if you notice any unusual bleeding. Be careful brushing and flossing your teeth or using a  toothpick because you may get an infection or bleed more easily. If you have any dental work done, tell your dentist you are receiving this medicine. Avoid taking products that contain aspirin, acetaminophen, ibuprofen, naproxen, or ketoprofen unless instructed by your doctor. These medicines may hide a fever. Do not become pregnant while taking this medicine. Women should inform their doctor if they wish to become pregnant or think they might be pregnant. There is a potential for serious side effects to an unborn child. Talk to your health care professional or pharmacist for more information. Do not breast-feed an infant while taking this medicine. Men should inform their doctor if they wish to father a child. This medicine may lower sperm counts. Do not treat diarrhea with over the counter products. Contact your doctor if you have diarrhea that lasts more than 2 days or if it is severe and watery. This medicine can make you more sensitive to the sun. Keep out of the sun. If you cannot avoid being in the sun, wear protective clothing and use sunscreen. Do not use sun lamps or tanning beds/booths. What  side effects may I notice from receiving this medicine? Side effects that you should report to your doctor or health care professional as soon as possible:  allergic reactions like skin rash, itching or hives, swelling of the face, lips, or tongue  low blood counts - this medicine may decrease the number of Prue blood cells, red blood cells and platelets. You may be at increased risk for infections and bleeding.  signs of infection - fever or chills, cough, sore throat, pain or difficulty passing urine  signs of decreased platelets or bleeding - bruising, pinpoint red spots on the skin, black, tarry stools, blood in the urine  signs of decreased red blood cells - unusually weak or tired, fainting spells, lightheadedness  breathing problems  changes in vision  chest pain  mouth sores  nausea  and vomiting  pain, swelling, redness at site where injected  pain, tingling, numbness in the hands or feet  redness, swelling, or sores on hands or feet  stomach pain  unusual bleeding Side effects that usually do not require medical attention (report to your doctor or health care professional if they continue or are bothersome):  changes in finger or toe nails  diarrhea  dry or itchy skin  hair loss  headache  loss of appetite  sensitivity of eyes to the light  stomach upset  unusually teary eyes This list may not describe all possible side effects. Call your doctor for medical advice about side effects. You may report side effects to FDA at 1-800-FDA-1088. Where should I keep my medicine? This drug is given in a hospital or clinic and will not be stored at home. NOTE: This sheet is a summary. It may not cover all possible information. If you have questions about this medicine, talk to your doctor, pharmacist, or health care provider.  2020 Elsevier/Gold Standard (2007-09-08 13:53:16)

## 2019-08-15 NOTE — Telephone Encounter (Signed)
-----   Message from Milus Banister, MD sent at 08/10/2019 10:13 AM EDT ----- We should be able to get a biopsy from that Pet avid node, unlikely to be done prior to next Monday however.   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 Thanks  Wynetta Fines  ----- Message ----- From: Truitt Merle, MD Sent: 08/10/2019   8:53 AM EDT To: Milus Banister, MD, Kyung Rudd, MD, #  Linna Hoff and Chester Holstein,  This lady has newly diagnosed anal cancer. Could you review her PET scan and let us know if you can do EUS biopsy of her hypermetabolic gastrohepatic ligament node? She is starting chemo and radiation next Monday  Thanks   Krista Blue

## 2019-08-15 NOTE — Telephone Encounter (Signed)
-----   Message from Truitt Merle, MD sent at 08/10/2019 10:15 AM EDT ----- Thanks much Dan, I understand it will take some time to schedule which will be fine.    Could you get her scheduled, but do not call her yet. I am seeing her this Friday and will go over with her first, so she is not surprised.   Thanks   Krista Blue  ----- Message ----- From: Milus Banister, MD Sent: 08/10/2019  10:13 AM EDT To: Kyung Rudd, MD, Timothy Lasso, RN, #  We should be able to get a biopsy from that Pet avid node, unlikely to be done prior to next Monday however.   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 Thanks  Wynetta Fines  ----- Message ----- From: Truitt Merle, MD Sent: 08/10/2019   8:53 AM EDT To: Milus Banister, MD, Kyung Rudd, MD, #  Linna Hoff and Chester Holstein,  This lady has newly diagnosed anal cancer. Could you review her PET scan and let us know if you can do EUS biopsy of her hypermetabolic gastrohepatic ligament node? She is starting chemo and radiation next Monday  Thanks   Krista Blue

## 2019-08-16 ENCOUNTER — Ambulatory Visit
Admission: RE | Admit: 2019-08-16 | Discharge: 2019-08-16 | Disposition: A | Discharge status: Home / Self Care | Payer: Medicare HMO | Source: Ambulatory Visit | Attending: Radiation Oncology | Admitting: Radiation Oncology

## 2019-08-16 ENCOUNTER — Other Ambulatory Visit: Payer: Self-pay

## 2019-08-16 DIAGNOSIS — Z510 Encounter for antineoplastic radiation therapy: Secondary | ICD-10-CM | POA: Diagnosis not present

## 2019-08-16 DIAGNOSIS — C210 Malignant neoplasm of anus, unspecified: Secondary | ICD-10-CM | POA: Diagnosis not present

## 2019-08-16 DIAGNOSIS — C20 Malignant neoplasm of rectum: Secondary | ICD-10-CM | POA: Diagnosis not present

## 2019-08-16 DIAGNOSIS — D123 Benign neoplasm of transverse colon: Secondary | ICD-10-CM

## 2019-08-16 NOTE — Telephone Encounter (Signed)
EUS scheduled, pt instructed and medications reviewed.  Patient instructions available in My Chart.  Confirmed pt can log in to My Chart.  Patient to call with any questions or concerns. COVID appt 4/19 at 1055 also notified.

## 2019-08-16 NOTE — Telephone Encounter (Signed)
EUS scheduled for 4/22 at 730 am at [facility] with Dr Ardis Hughs COVID testing on 4/19 at 1055

## 2019-08-16 NOTE — Progress Notes (Signed)
Pt here for patient teaching.  Pt given Radiation and You booklet.  Reviewed areas of pertinence such as diarrhea, fatigue, hair loss, nausea and vomiting, sexual and fertility changes, skin changes and urinary and bladder changes . Pt able to give teach back of to pat skin, use unscented/gentle soap, use baby wipes, have Imodium on hand, drink plenty of water and sitz bath,avoid applying anything to skin within 4 hours of treatment. Pt verbalizes understanding of information given and will contact nursing with any questions or concerns.     Corda Shutt M. Kuzey Ogata RN, BSN      

## 2019-08-17 ENCOUNTER — Ambulatory Visit
Admission: RE | Admit: 2019-08-17 | Discharge: 2019-08-17 | Disposition: A | Discharge status: Home / Self Care | Payer: Medicare HMO | Source: Ambulatory Visit | Attending: Radiation Oncology | Admitting: Radiation Oncology

## 2019-08-17 ENCOUNTER — Other Ambulatory Visit: Payer: Self-pay

## 2019-08-17 ENCOUNTER — Telehealth: Payer: Self-pay | Admitting: *Deleted

## 2019-08-17 ENCOUNTER — Ambulatory Visit: Payer: Medicare HMO | Attending: Hematology | Admitting: Physical Therapy

## 2019-08-17 ENCOUNTER — Encounter: Payer: Self-pay | Admitting: Physical Therapy

## 2019-08-17 DIAGNOSIS — C210 Malignant neoplasm of anus, unspecified: Secondary | ICD-10-CM | POA: Diagnosis not present

## 2019-08-17 DIAGNOSIS — M6281 Muscle weakness (generalized): Secondary | ICD-10-CM | POA: Diagnosis not present

## 2019-08-17 DIAGNOSIS — Z510 Encounter for antineoplastic radiation therapy: Secondary | ICD-10-CM | POA: Diagnosis not present

## 2019-08-17 DIAGNOSIS — C20 Malignant neoplasm of rectum: Secondary | ICD-10-CM | POA: Diagnosis not present

## 2019-08-17 DIAGNOSIS — R278 Other lack of coordination: Secondary | ICD-10-CM | POA: Diagnosis not present
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
## 2019-08-17 NOTE — Patient Instructions (Addendum)
Slow Contraction: Gravity Resisted (Sitting)    Sitting, slowly squeeze anus  for _10__ seconds. Rest for _10__ seconds. Repeat _5__ times. Then squeeze the vaginal for 10 seconds and rest for 10 seconds. Do _3__ times a day. Then end with 5 quick flicks.  Copyright  VHI. All rights reserved.     Introduction to Bowel Health Diet and daily habits can help you predict when your bowels will move on a regular basis.  The consistency and quantity of the stool is usually more important than the frequency.  The goal is to have a regular bowel movement that is soft but formed.   Tips on Emptying Regularly . Eat breakfast.  Usually the best time of day for a bowel movement will be a half hour to an hour after eating.  These times are best because the body uses the gastrocolic reflex, a stimulation of bowel motion that occurs with eating, to help produce a bowel movement.  For some people even a simple hot drink in the morning can help the reflex action begin. . Eat all your meals at a predictable time each day.  The bowel functions best when food is introduced at the same regular intervals. . The amount of food eaten at a given time of day should be about the same size from day to day.  The bowel functions best when food is introduced in similar quantities from day to day. It is fine to have a small breakfast and a large lunch, or vice versa, just be consistent. . Eat two servings of fruit or vegetables and at least one serving of a complex carbohydrates (whole grains such as brown rice, bran, whole wheat bread, or oatmeal) at each meal. . Drink plenty of water--ideally eight glasses a day.  Be sure to increase your water intake if you are increasing fiber into your diet.  Maintain Healthy Habits . Exercise daily.  You may exercise at any time of day, but you may find that bowel function is helped most if the exercise is at a consistent time each day. . Make sure that you are not rushed and have  convenient access to a bathroom at your selected time to empty your bowels. .   Skin Care and Bowel Hygiene  Anyone who has frequent bowel movements, diarrhea, or bowel leakage (fecal incontinence) may experience soreness or skin irritation around the anal region.  Occasionally, the skin can become so inflamed that it breaks into open sores.  Prevent skin breakdown by following good skin care habits.  Cleaning and Washing Techniques After having a bowel movement, men and women should tighten their anal sphincter before wiping.  Women should always wipe from front to back to prevent fecal matter from getting into the urethra and vagina.   Tips for Cleaning and Washing . wipe from front to back towards the anus . always wipe gently with soft toilet paper, or ideally with moist toilet paper . wipe only once with each piece of toilet paper so as not to re-contaminate the area . wash in warm water alone or with a minimal amount of mild, fragrance-free soap . use non-biological washing powder . gently pat skin completely dry, avoiding rubbing . if drying the skin after washing is difficult or uncomfortable, try using a hairdryer on a low cold setting (use very carefully) . allow air to get to the irritated area for some part of every day . use protective skin creams containing zinc as recommended by your  doctor  What To Avoid . baths with extra-hot water . soaking for long periods of time in the bathtub . disinfectants and antiseptics  . bath oils, bath salts, and talcum powder . using plastic pants, pads, and sheets, which cause sweating . scratching at the irritated area  Additional Tips . some people find that citrus and acidic foods cause or worsen skin irritation . eat a healthy, balanced diet that is high in fiber  . drink plenty of fluids . wear cotton underwear to allow the skin to breath . talk to your healthcare provider about further treatment options; persistent problems need  medical attention  Earlie Counts, PT [place], [place] [address]     [phone]  Toileting Techniques for Bowel Movements    An Evacuation/Defecation Plan   Here are the 4 basic points:  1. Lean forward enough for your elbows to rest on your knees 2. Support your feet on the floor or use a low stool if your feet don't touch the floor  3. Push out your belly as if you have swallowed a beach ball--you should feel a widening of your waist. "Belly Big, Belly Hard" 4. Open and relax your pelvic floor muscles, rather than tightening around the anus  While you are sitting on the toilet pay attention to the following areas: . Jaw and mouth position- relaxed not clenched . Angle of your hips - leaning slightly forward . Whether your feet touch the ground or not - should be flat and supported . Arm placement - rest against your thighs . Spine position - flat back . Waist . Breathing - exhale as you push (like blowing up a balloon or try using other sounds such as ahhhh, shhhhh, ohhhh or grrrrrrr) . Belly - hard and tight as you push . Anus (opening of the anal canal) - relaxed and open as you push . Anus - Tighten and lift pulling the muscle back in after you are done or if taking a break  If you are not successful after 10-15 minutes, try again later.  Avoid negative self-talk about your toileting experience.   Read this for more details and ask your PT if you need suggestions for adjustments or limitations:  1) Sitting on the toilet  a) Make sure your feet are supported - flat on the floor or step stool b) Many people find it effective to lean forward or raise their knees.  Propping your feet on a step stool (squatty potty is a brand name) can help the muscles around the anus to relax  c) When you lean forward, place your forearms on your thighs for support  2) Relaxing a) Breathe deeply and slowly in through your nose and out through your mouth. b) To become aware  of how to relax your muscles, contracting and releasing muscles can be helpful.  Pull your pelvic floor muscles in tightly by using the image of holding back gas, or closing around the anus (visualize making a circle smaller) and lifting the anus up and in.  Then release the muscles and your anus should drop down and feel open. Repeat 5 times ending with the feeling of relaxation. c) Keep your pelvic floor muscles relaxed; let your belly bulge out. d) The digestive tract starts at the mouth and ends at the anal opening, so be sure to relax both ends of the tube.  Place your tongue on the roof of your mouth with your teeth separated.  This helps  relax your mouth and will help to relax the anus at the same time.  3) Emptying (defecation) a) Keep your pelvic floor and sphincter relaxed, then bulge your anal muscles.  Make the anal opening wide.  b) Stick your belly out as if you have swallowed a beach ball. c) Make your belly wall hard using your belly muscles while continuing to breathe. Doing this makes it easier to open your anus. d) Breath out and give a grunt (or try using other sounds such as ahhhh, shhhhh, ohhhh or grrrrrrr). e)  Can also try to act as if you are blowing up a balloon as you push  4) Finishing a) As you finish your bowel movement, pull the pelvic floor muscles up and in.  This will leave your anus in the proper place rather than remaining pushed out and down. If you leave your anus pushed out and down, it will start to feel as though that is normal and give you incorrect signals about needing to have a bowel movement. Tandem Stance    Can hold onto counter if not feeling steady. Right foot in front of left, heel touching toe both feet "straight ahead". Stand on Foot Triangle of Support with both feet. Balance in this position _30__ seconds. Do with left foot in front of right. 3 times each  Copyright  VHI. All rights reserved.    Stance: single leg on floor. Raise leg.  Hold _15__ seconds. Repeat with other leg. __3_ reps per set, _1__ sets per day .  Can hold onto counter to keep steady  Copyright  VHI. All rights reserved.  [facility] [address], [place], [place] [address] Phone # [phone] Fax [phone] [email or web address] or go through my chart

## 2019-08-18 ENCOUNTER — Other Ambulatory Visit: Payer: Self-pay

## 2019-08-18 ENCOUNTER — Ambulatory Visit
Admission: RE | Admit: 2019-08-18 | Discharge: 2019-08-18 | Disposition: A | Discharge status: Home / Self Care | Payer: Medicare HMO | Source: Ambulatory Visit | Attending: Radiation Oncology | Admitting: Radiation Oncology

## 2019-08-18 DIAGNOSIS — C210 Malignant neoplasm of anus, unspecified: Secondary | ICD-10-CM | POA: Diagnosis not present

## 2019-08-18 DIAGNOSIS — C20 Malignant neoplasm of rectum: Secondary | ICD-10-CM | POA: Diagnosis present

## 2019-08-18 DIAGNOSIS — Z510 Encounter for antineoplastic radiation therapy: Secondary | ICD-10-CM | POA: Diagnosis not present

## 2019-08-18 NOTE — Progress Notes (Signed)
[facility]   [phone]) [phone] [phone]) [phone]   Clinic Follow up Note   Patient Care Team: Seward Carol, MD as PCP - General (Internal Medicine) Carlyle Basques, MD as PCP - Infectious Diseases (Infectious Diseases) Binnie Rail, DC as Referring Physician (Chiropractic Medicine) Jonnie Finner, RN as Oncology Nurse Navigator  Date of Service:  08/22/2019  CHIEF COMPLAINT: F/u of anal cancer   SUMMARY OF ONCOLOGIC HISTORY: Oncology History Overview Note  Cancer Staging Anal cancer ([place]) Staging form: Anus, AJCC 8th Edition - Clinical: Stage IV (cT2, cN0, cM1) - Signed by Truitt Merle, MD on 08/12/2019    Anal cancer ([place])  07/19/2019 Procedure   Colonoscopy by Dr Havery Moros 07/19/19  IMPRESSION - Ulcer noted at the anal canal in posterior midline canal that extends into the distal rectum on digital rectal exam, nodular and somewhat hard to palpation. Endoscopic images show nodular tissue in the area, concerning for malignant ulcer. - The examined portion of the ileum was normal. - One 3 mm polyp at the hepatic flexure, removed with a cold snare. Resected and retrieved. - One 5 mm polyp in the transverse colon, removed with a cold snare. Resected and retrieved. - Diverticulosis in the ascending colon. - The examination was otherwise normal.   07/19/2019 Initial Biopsy   Diagnosis 07/19/19 1. Transverse Colon Polyp, hepatic flexure (2) - TUBULAR ADENOMA (1 OF 3 FRAGMENTS) - BENIGN COLONIC MUCOSA (2 OF 3 FRAGMENTS) - NO HIGH GRADE DYSPLASIA OR MALIGNANCY IDENTIFIED 2. Rectum, biopsy, distal rectal anal canal - SQUAMOUS CELL CARCINOMA - SEE COMMENT   07/26/2019 Initial Diagnosis   Anal cancer ([place])   07/26/2019 Cancer Staging   Staging form: Anus, AJCC 8th Edition - Clinical: Stage IV (cT2, cN1c, cM1) - Signed by Truitt Merle, MD on 08/12/2019   08/04/2019 PET scan   IMPRESSION: Hypermetabolic anal soft tissue mass, consistent with known primary anal  carcinoma.   Sub-cm hypermetabolic lymph nodes in posterior perirectal space, right inguinal region, right iliac chain, and gastrohepatic ligament, suspicious for metastatic disease.   Small hypermetabolic left axillary lymph nodes. This would be unusual location for metastatic anal carcinoma. Recommend clinical correlation for possibility the patient has had recent COVID vaccination in the left arm, which could explain this finding.   Multifocal airspace disease in both lower lungs with marked hypermetabolic activity. This favors infectious or inflammatory etiology, and is not typical for pulmonary metastases. Short-term follow-up by chest CT is recommended.   08/15/2019 -  Radiation Therapy   Concurrent ChemoRT with Dr. Lisbeth Renshaw starting 08/15/19   08/15/2019 -  Chemotherapy   Concurrent ChemoRT with Mitomycin and 5FU on week 1 and 5 starting 08/15/19       CURRENT THERAPY:  Concurrent chemoRT with Mitomycin and 5FU on week 1 and 5 for 6 weeks starting 08/15/19.   INTERVAL HISTORY:  ALLEENA GANDOLFO is here for a follow up of treatment. She presents to the clinic alone. She notes her first week chemo went well. She had loose BM, no true diarrhea. She has taken off for work. She notes she still needs another letter to her school leave of absence to June 22 when she plans to return to school. She notes she has Reticare cream but it costs more and will ask Rad Onc for prescription. She notes having only mild increase in rectal pain.     REVIEW OF SYSTEMS:   Constitutional: Denies fevers, chills or abnormal weight loss Eyes: Denies blurriness of vision  Ears, nose, mouth, throat, and face: Denies mucositis or sore throat Respiratory: Denies cough, dyspnea or wheezes Cardiovascular: Denies palpitation, chest discomfort or lower extremity swelling Gastrointestinal:  Denies nausea, heartburn or change in bowel habits (+) Mild increase in rectal pain  Skin: Denies abnormal skin  rashes Lymphatics: Denies new lymphadenopathy or easy bruising Neurological:Denies numbness, tingling or new weaknesses Behavioral/Psych: Mood is stable, no new changes  All other systems were reviewed with the patient and are negative.  MEDICAL HISTORY:  Past Medical History:  Diagnosis Date  . Allergic rhinitis   . Biliary colic   . Gall bladder disease \  . Hepatitis C   . Herpes   . HIV infection ([place])   . Hyperparathyroidism ([place])   . Hypertension   . Insomnia   . Osteoporosis   . Pneumonia    2010    SURGICAL HISTORY: Past Surgical History:  Procedure Laterality Date  . CHOLECYSTECTOMY N/A 01/09/2015   Procedure: LAPAROSCOPIC CHOLECYSTECTOMY;  Surgeon: [name], MD;  Location: [facility];  Service: General;  Laterality: N/A;  . ECTOPIC PREGNANCY SURGERY    . IR FLUORO GUIDE CV LINE LEFT  08/15/2019  . ORIF TIBIA PLATEAU Right 02/18/2017   Procedure: OPEN REDUCTION INTERNAL FIXATION (ORIF) RIGHT TIBIAL PLATEAU;  Surgeon: Leandrew Koyanagi, MD;  Location: [facility];  Service: Orthopedics;  Laterality: Right;  . SHOULDER SURGERY Right 12/2014    I have reviewed the social history and family history with the patient and they are unchanged from previous note.  ALLERGIES:  is allergic to dapsone; retrovir [zidovudine]; and sulfamethoxazole-trimethoprim.  MEDICATIONS:  Current Outpatient Medications  Medication Sig Dispense Refill  . AMBULATORY NON FORMULARY MEDICATION Nitroglycerine ointment 0.125 %  Apply a pea sized amount internally four times daily. 30 g 1  . amLODipine (NORVASC) 10 MG tablet Take 1 tablet (10 mg total) by mouth daily. 90 tablet 3  . amoxicillin (AMOXIL) 500 MG capsule amoxicillin 500 mg capsule  TAKE 1 CAPSULE BY MOUTH 3 TIMES DAILY FOR 10 DAYS FOR DENTAL INFECTION    . calcium carbonate (TUMS - DOSED IN MG ELEMENTAL CALCIUM) 500 MG chewable tablet Chew 1 tablet by mouth as needed for indigestion or heartburn.    . diazepam (VALIUM) 5 MG tablet diazepam 5  mg tablet    . ferrous sulfate 325 (65 FE) MG tablet Take 325 mg by mouth daily.     Marland Kitchen HYDROcodone-acetaminophen (NORCO) 5-325 MG tablet Take 1-2 tablets by mouth every 6 (six) hours as needed for moderate pain. 60 tablet 0  . hydrocortisone (ANUSOL-HC) 2.5 % rectal cream Anusol-HC 2.5 % topical cream with perineal applicator  APPLY A THIN LAYER TO THE AFFECTED AREA(S) BY TOPICAL ROUTE 2-4 TIMESDAILY    . hydrocortisone 2.5 % cream hydrocortisone 2.5 % topical cream with perineal applicator  APPLY THIN LAYER EXTERNALLY TO THE AFFECTED AREA 2 TO 4 TIMES DAILY    . ibuprofen (ADVIL) 800 MG tablet TAKE 1 TABLET BY MOUTH EVERY 8 HOURS AS NEEDED 90 tablet 0  . Multiple Vitamins-Minerals (ADULT GUMMY) CHEW Chew 3 capsules by mouth daily.    . ondansetron (ZOFRAN) 8 MG tablet Take 1 tablet (8 mg total) by mouth 2 (two) times daily as needed (Nausea or vomiting). (Patient not taking: Reported on 08/12/2019) 30 tablet 1  . prochlorperazine (COMPAZINE) 10 MG tablet Take 1 tablet (10 mg total) by mouth every 6 (six) hours as needed (Nausea or vomiting). (Patient not taking: Reported on 08/12/2019) 30 tablet  1  . RECTICARE 5 % CREA APPLY EXTERNALLY TO THE AFFECTED AREA FOUR TIMES DAILY FOR 14 DAYS AS NEEDED    . TRIUMEQ 600-50-300 MG tablet TAKE 1 TABLET BY MOUTH DAILY 30 tablet 3  . zolpidem (AMBIEN) 10 MG tablet Take 1 tablet (10 mg total) by mouth at bedtime as needed. 30 tablet 1   No current facility-administered medications for this visit.    PHYSICAL EXAMINATION: ECOG PERFORMANCE STATUS: 1 - Symptomatic but completely ambulatory  Vitals:   08/22/19 1305  BP: (!) 143/119  Pulse: 91  Resp: 20  Temp: 98.2 F (36.8 C)  SpO2: 97%   Filed Weights   08/22/19 1305  Weight: 166 lb 11.2 oz (75.6 kg)    Due to COVID19 we will limit examination to appearance. Patient had no complaints.  GENERAL:alert, no distress and comfortable SKIN: skin color normal, no rashes or significant lesions EYES:  normal, Conjunctiva are pink and non-injected, sclera clear  NEURO: alert & oriented x 3 with fluent speech   LABORATORY DATA:  I have reviewed the data as listed CBC Latest Ref Rng & Units 08/22/2019 08/15/2019 08/08/2019  WBC 4.0 - 10.5 K/uL 2.5(L) 5.9 5.7  Hemoglobin 12.0 - 15.0 g/dL 10.6(L) 11.4(L) 11.9  Hematocrit 36.0 - 46.0 % 32.8(L) 34.9(L) 36.9  Platelets 150 - 400 K/uL 158 185 222     CMP Latest Ref Rng & Units 08/22/2019 08/15/2019 08/08/2019  Glucose 70 - 99 mg/dL 83 101(H) 92  BUN 8 - 23 mg/dL 19 17 15   Creatinine 0.44 - 1.00 mg/dL 0.90 0.88 0.87  Sodium 135 - 145 mmol/L 144 144 139  Potassium 3.5 - 5.1 mmol/L 3.7 3.5 3.6  Chloride 98 - 111 mmol/L 110 111 102  CO2 22 - 32 mmol/L 24 23 28   Calcium 8.9 - 10.3 mg/dL 9.4 9.4 10.0  Total Protein 6.5 - 8.1 g/dL 8.0 8.4(H) 8.1  Total Bilirubin 0.3 - 1.2 mg/dL 0.5 0.7 0.5  Alkaline Phos 38 - 126 U/L 82 89 -  AST 15 - 41 U/L 22 30 23   ALT 0 - 44 U/L 12 14 10       RADIOGRAPHIC STUDIES: I have personally reviewed the radiological images as listed and agreed with the findings in the report. No results found.   ASSESSMENT & PLAN:  Vanessa Beard is a 69 y.o. 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 Based on the description on colonoscopy report, shehasmore than 2 cmlong mass in herlowrectumanal canal. Her biopsy results show Squamous Cell Carcinoma.This is consistent with anal cancer. She has HIV, which is a high risk for anal cancer. -I personally reviewed and discussed her PET from 08/04/19 which showed hypermetabolic anal mass,hypermetabolic LNs in posterior perirectal space, right inguinal region, right iliac chain and gastrohepatic ligament suspicious for metastasis. Scan also showed hypermetabolic left axillary LNs, which is unusual metastatic pattern. She denies recent injury or injections in  the left arm. I recommend biopsy of axillary LN or EUS gastrohepatic node to rule out distant metastasis. Given she rather be put under she rather proceed with Endoscopy biopsy first. I have referred her to Dr. Ardis Hughs.  -Although metastatic disease is not ruled out at this point, Dr. Lisbeth Renshaw is able to cover the gastrohepatic lymph node in the radiation field. I recommended concurrent ChemoRT for 6 weeks with Mitomycin and 5FU on week 1 and 5, which  is the standard therapy for definitive anal cancer therapy. She started on 08/15/19. She will have PICC line placed on first day of chemo on week 1 and 5.  -I plan to repeat CT chest to evaluate her left axillary lymph node after she completes chemoradiation. -S/p week 1 treatment she tolerated well. She only had mild loose stool. Labs reviewed, WBC 2.5, Hg 10.6. I encouraged her to watch for sings of infection. Will watch blood counts on following weeks.  -She plans to have her EUS biopsy of the gastrohepatic ligament node with Dr. Ardis Hughs on 09/08/19.  -Continue Radiation and watch for rectal pain.  -F/u next week    2. Hep C, HIV -She underwentsuccessfulHep C treatment 2 years ago [id number]) -Her HIV was contracted through sex. Her HIV is well controlled. She will continue to f/u ID Dr Graylon Good.Last CD4 count was 386 on 02/07/2019.   3. Comorbidities: HTN, Hyperparathyroidism, Osteoporosis -Continue medications and f/u with PCP.    4. Social Support  -She is divorced, lives alone and has 2 adult children who live in [place]  -She is getting bachelors degree in Criminal Justice through Online schooling, she plans to complete in 10/2019 but may postpone her final classes.  -She is currently working at [employer] as [employer] and works 8-9 hours every day. She notes her work is flexible to take time off if needed.    PLAN: -Continue Radiation  -lab and F/u next week  -Per pt request I write a letter with corrected dates for  her school's leave of absence.  -EGD with D.r Ardis Hughs on 09/08/19   No problem-specific Assessment & Plan notes found for this encounter.   No orders of the defined types were placed in this encounter.  All questions were answered. The patient knows to call the clinic with any problems, questions or concerns. No barriers to learning was detected. The total time spent in the appointment was 25 minutes.     Truitt Merle, MD 08/22/2019   I, Joslyn Devon, am acting as scribe for Truitt Merle, MD.   I have reviewed the above documentation for accuracy and completeness, and I agree with the above.

## 2019-08-19 ENCOUNTER — Inpatient Hospital Stay: Payer: Medicare HMO | Attending: Hematology

## 2019-08-19 ENCOUNTER — Other Ambulatory Visit: Payer: Self-pay

## 2019-08-19 ENCOUNTER — Ambulatory Visit
Admission: RE | Admit: 2019-08-19 | Discharge: 2019-08-19 | Disposition: A | Discharge status: Home / Self Care | Payer: Medicare HMO | Source: Ambulatory Visit | Attending: Radiation Oncology | Admitting: Radiation Oncology

## 2019-08-19 VITALS — BP 161/87 | HR 98 | Temp 98.0°F | Resp 20

## 2019-08-19 DIAGNOSIS — R197 Diarrhea, unspecified: Secondary | ICD-10-CM | POA: Insufficient documentation

## 2019-08-19 DIAGNOSIS — J984 Other disorders of lung: Secondary | ICD-10-CM | POA: Insufficient documentation

## 2019-08-19 DIAGNOSIS — D123 Benign neoplasm of transverse colon: Secondary | ICD-10-CM | POA: Insufficient documentation

## 2019-08-19 DIAGNOSIS — K5730 Diverticulosis of large intestine without perforation or abscess without bleeding: Secondary | ICD-10-CM | POA: Insufficient documentation

## 2019-08-19 DIAGNOSIS — Z881 Allergy status to other antibiotic agents status: Secondary | ICD-10-CM | POA: Diagnosis not present

## 2019-08-19 DIAGNOSIS — Z5111 Encounter for antineoplastic chemotherapy: Secondary | ICD-10-CM | POA: Insufficient documentation

## 2019-08-19 DIAGNOSIS — Z21 Asymptomatic human immunodeficiency virus [HIV] infection status: Secondary | ICD-10-CM | POA: Insufficient documentation

## 2019-08-19 DIAGNOSIS — C20 Malignant neoplasm of rectum: Secondary | ICD-10-CM | POA: Diagnosis not present

## 2019-08-19 DIAGNOSIS — I10 Essential (primary) hypertension: Secondary | ICD-10-CM | POA: Diagnosis not present

## 2019-08-19 DIAGNOSIS — C210 Malignant neoplasm of anus, unspecified: Secondary | ICD-10-CM

## 2019-08-19 DIAGNOSIS — D61818 Other pancytopenia: Secondary | ICD-10-CM | POA: Insufficient documentation

## 2019-08-19 DIAGNOSIS — Z79899 Other long term (current) drug therapy: Secondary | ICD-10-CM | POA: Diagnosis not present

## 2019-08-19 DIAGNOSIS — R69 Illness, unspecified: Secondary | ICD-10-CM | POA: Diagnosis not present

## 2019-08-19 DIAGNOSIS — E213 Hyperparathyroidism, unspecified: Secondary | ICD-10-CM | POA: Insufficient documentation

## 2019-08-19 DIAGNOSIS — M810 Age-related osteoporosis without current pathological fracture: Secondary | ICD-10-CM | POA: Insufficient documentation

## 2019-08-19 DIAGNOSIS — Z510 Encounter for antineoplastic radiation therapy: Secondary | ICD-10-CM | POA: Diagnosis not present

## 2019-08-19 DIAGNOSIS — C211 Malignant neoplasm of anal canal: Secondary | ICD-10-CM | POA: Insufficient documentation

## 2019-08-19 DIAGNOSIS — B1920 Unspecified viral hepatitis C without hepatic coma: Secondary | ICD-10-CM | POA: Insufficient documentation

## 2019-08-19 MED ORDER — SODIUM CHLORIDE 0.9% FLUSH
10.0000 mL | INTRAVENOUS | Status: DC | PRN
  Administered 2019-08-19: 10 mL
  Filled 2019-08-19: qty 10

## 2019-08-19 MED ORDER — HEPARIN SOD (PORK) LOCK FLUSH 100 UNIT/ML IV SOLN
500.0000 [IU] | Freq: Once | INTRAVENOUS | Status: AC | PRN
  Administered 2019-08-19: 500 [IU]
  Filled 2019-08-19: qty 5

## 2019-08-19 NOTE — Patient Instructions (Signed)

## 2019-08-22 ENCOUNTER — Ambulatory Visit
Admission: RE | Admit: 2019-08-22 | Discharge: 2019-08-22 | Disposition: A | Discharge status: Home / Self Care | Payer: Medicare HMO | Source: Ambulatory Visit | Attending: Radiation Oncology | Admitting: Radiation Oncology

## 2019-08-22 ENCOUNTER — Inpatient Hospital Stay (HOSPITAL_BASED_OUTPATIENT_CLINIC_OR_DEPARTMENT_OTHER): Payer: Medicare HMO | Admitting: Hematology

## 2019-08-22 ENCOUNTER — Other Ambulatory Visit: Payer: Self-pay

## 2019-08-22 ENCOUNTER — Inpatient Hospital Stay: Payer: Medicare HMO

## 2019-08-22 ENCOUNTER — Encounter: Payer: Self-pay | Admitting: Hematology

## 2019-08-22 ENCOUNTER — Other Ambulatory Visit: Payer: Self-pay | Admitting: Radiation Oncology

## 2019-08-22 VITALS — BP 143/119 | HR 91 | Temp 98.2°F | Resp 20 | Ht 60.0 in | Wt 166.7 lb

## 2019-08-22 DIAGNOSIS — K5730 Diverticulosis of large intestine without perforation or abscess without bleeding: Secondary | ICD-10-CM | POA: Diagnosis not present

## 2019-08-22 DIAGNOSIS — D123 Benign neoplasm of transverse colon: Secondary | ICD-10-CM | POA: Diagnosis not present

## 2019-08-22 DIAGNOSIS — C20 Malignant neoplasm of rectum: Secondary | ICD-10-CM | POA: Diagnosis not present

## 2019-08-22 DIAGNOSIS — C210 Malignant neoplasm of anus, unspecified: Secondary | ICD-10-CM

## 2019-08-22 DIAGNOSIS — D61818 Other pancytopenia: Secondary | ICD-10-CM | POA: Diagnosis not present

## 2019-08-22 DIAGNOSIS — R69 Illness, unspecified: Secondary | ICD-10-CM | POA: Diagnosis not present

## 2019-08-22 DIAGNOSIS — I10 Essential (primary) hypertension: Secondary | ICD-10-CM | POA: Diagnosis not present

## 2019-08-22 DIAGNOSIS — B182 Chronic viral hepatitis C: Secondary | ICD-10-CM | POA: Diagnosis not present

## 2019-08-22 DIAGNOSIS — R197 Diarrhea, unspecified: Secondary | ICD-10-CM | POA: Diagnosis not present

## 2019-08-22 DIAGNOSIS — Z5111 Encounter for antineoplastic chemotherapy: Secondary | ICD-10-CM | POA: Diagnosis not present

## 2019-08-22 DIAGNOSIS — J984 Other disorders of lung: Secondary | ICD-10-CM | POA: Diagnosis not present

## 2019-08-22 DIAGNOSIS — B20 Human immunodeficiency virus [HIV] disease: Secondary | ICD-10-CM

## 2019-08-22 DIAGNOSIS — C211 Malignant neoplasm of anal canal: Secondary | ICD-10-CM | POA: Diagnosis not present

## 2019-08-22 DIAGNOSIS — E213 Hyperparathyroidism, unspecified: Secondary | ICD-10-CM | POA: Diagnosis not present

## 2019-08-22 DIAGNOSIS — Z510 Encounter for antineoplastic radiation therapy: Secondary | ICD-10-CM | POA: Diagnosis not present

## 2019-08-22 LAB — CMP (CANCER CENTER ONLY)
ALT: 12 U/L (ref 0–44)
AST: 22 U/L (ref 15–41)
Albumin: 3.4 g/dL — ABNORMAL LOW (ref 3.5–5.0)
Alkaline Phosphatase: 82 U/L (ref 38–126)
Anion gap: 10 (ref 5–15)
BUN: 19 mg/dL (ref 8–23)
CO2: 24 mmol/L (ref 22–32)
Calcium: 9.4 mg/dL (ref 8.9–10.3)
Chloride: 110 mmol/L (ref 98–111)
Creatinine: 0.9 mg/dL (ref 0.44–1.00)
GFR, Est AFR Am: >60 mL/min (ref >60)
GFR, Estimated: >60 mL/min (ref >60)
Glucose, Bld: 83 mg/dL (ref 70–99)
Potassium: 3.7 mmol/L (ref 3.5–5.1)
Sodium: 144 mmol/L (ref 135–145)
Total Bilirubin: 0.5 mg/dL (ref 0.3–1.2)
Total Protein: 8 g/dL (ref 6.5–8.1)

## 2019-08-22 LAB — CBC WITH DIFFERENTIAL (CANCER CENTER ONLY)
Abs Immature Granulocytes: 0.1 10*3/uL — ABNORMAL HIGH (ref 0.00–0.07)
Basophils Absolute: 0 10*3/uL (ref 0.0–0.1)
Basophils Relative: 2 %
Eosinophils Absolute: 0 10*3/uL (ref 0.0–0.5)
Eosinophils Relative: 1 %
HCT: 32.8 % — ABNORMAL LOW (ref 36.0–46.0)
Hemoglobin: 10.6 g/dL — ABNORMAL LOW (ref 12.0–15.0)
Immature Granulocytes: 4 %
Lymphocytes Relative: 7 %
Lymphs Abs: 0.2 10*3/uL — ABNORMAL LOW (ref 0.7–4.0)
MCH: 29.6 pg (ref 26.0–34.0)
MCHC: 32.3 g/dL (ref 30.0–36.0)
MCV: 91.6 fL (ref 80.0–100.0)
Monocytes Absolute: 0.1 10*3/uL (ref 0.1–1.0)
Monocytes Relative: 2 %
Neutro Abs: 2.1 10*3/uL (ref 1.7–7.7)
Neutrophils Relative %: 84 %
Platelet Count: 158 10*3/uL (ref 150–400)
RBC: 3.58 MIL/uL — ABNORMAL LOW (ref 3.87–5.11)
RDW: 12.8 % (ref 11.5–15.5)
WBC Count: 2.5 10*3/uL — ABNORMAL LOW (ref 4.0–10.5)
nRBC: 0 % (ref 0.0–0.2)

## 2019-08-22 MED ORDER — OXYCODONE HCL 5 MG PO TABS: 5.0000 mg | ORAL_TABLET | Freq: Four times a day (QID) | ORAL | 0 refills | Status: DC | PRN

## 2019-08-23 ENCOUNTER — Ambulatory Visit (INDEPENDENT_AMBULATORY_CARE_PROVIDER_SITE_OTHER): Payer: Medicare HMO | Admitting: Internal Medicine

## 2019-08-23 ENCOUNTER — Encounter: Payer: Self-pay | Admitting: Internal Medicine

## 2019-08-23 ENCOUNTER — Telehealth: Payer: Self-pay | Admitting: Hematology

## 2019-08-23 ENCOUNTER — Other Ambulatory Visit: Payer: Self-pay

## 2019-08-23 ENCOUNTER — Ambulatory Visit
Admission: RE | Admit: 2019-08-23 | Discharge: 2019-08-23 | Disposition: A | Discharge status: Home / Self Care | Payer: Medicare HMO | Source: Ambulatory Visit | Attending: Radiation Oncology | Admitting: Radiation Oncology

## 2019-08-23 VITALS — BP 132/85 | HR 83 | Temp 98.0°F | Wt 163.0 lb

## 2019-08-23 DIAGNOSIS — C210 Malignant neoplasm of anus, unspecified: Secondary | ICD-10-CM | POA: Diagnosis not present

## 2019-08-23 DIAGNOSIS — R768 Other specified abnormal immunological findings in serum: Secondary | ICD-10-CM

## 2019-08-23 DIAGNOSIS — D729 Disorder of white blood cells, unspecified: Secondary | ICD-10-CM

## 2019-08-23 DIAGNOSIS — C20 Malignant neoplasm of rectum: Secondary | ICD-10-CM | POA: Diagnosis not present

## 2019-08-23 DIAGNOSIS — I10 Essential (primary) hypertension: Secondary | ICD-10-CM | POA: Diagnosis not present

## 2019-08-23 DIAGNOSIS — B20 Human immunodeficiency virus [HIV] disease: Secondary | ICD-10-CM

## 2019-08-23 DIAGNOSIS — R69 Illness, unspecified: Secondary | ICD-10-CM | POA: Diagnosis not present

## 2019-08-23 DIAGNOSIS — Z510 Encounter for antineoplastic radiation therapy: Secondary | ICD-10-CM | POA: Diagnosis not present

## 2019-08-23 MED ORDER — AMLODIPINE BESYLATE 10 MG PO TABS: 10.0000 mg | ORAL_TABLET | Freq: Every day | ORAL | 3 refills | Status: DC

## 2019-08-23 NOTE — Progress Notes (Signed)
RFV: follow up for hiv disease  Patient ID: Vanessa Beard, female   DOB: 07-05-1950, 69 y.o.   MRN: [id number]  HPI Vanessa Beard is a 69yo F with hiv disease, CD 4 count 237/VL<20, on triumeq. Recently diagnosed with anal cancer in early march. Just started concurrent radiation and chemo  with Mitomycin and 5FU on week 1 and 5 for 6 weeks starting 08/15/19.thus far, she is tolerating her treatment. Denies missing doses of her medication. She has notices small decrease in her energy level.  Outpatient Encounter Medications as of 08/23/2019  Medication Sig  . AMBULATORY NON FORMULARY MEDICATION Nitroglycerine ointment 0.125 %  Apply a pea sized amount internally four times daily.  Marland Kitchen amLODipine (NORVASC) 10 MG tablet Take 1 tablet (10 mg total) by mouth daily.  Marland Kitchen amoxicillin (AMOXIL) 500 MG capsule amoxicillin 500 mg capsule  TAKE 1 CAPSULE BY MOUTH 3 TIMES DAILY FOR 10 DAYS FOR DENTAL INFECTION  . calcium carbonate (TUMS - DOSED IN MG ELEMENTAL CALCIUM) 500 MG chewable tablet Chew 1 tablet by mouth as needed for indigestion or heartburn.  . diazepam (VALIUM) 5 MG tablet diazepam 5 mg tablet  . ferrous sulfate 325 (65 FE) MG tablet Take 325 mg by mouth daily.   Marland Kitchen HYDROcodone-acetaminophen (NORCO) 5-325 MG tablet Take 1-2 tablets by mouth every 6 (six) hours as needed for moderate pain.  . hydrocortisone (ANUSOL-HC) 2.5 % rectal cream Anusol-HC 2.5 % topical cream with perineal applicator  APPLY A THIN LAYER TO THE AFFECTED AREA(S) BY TOPICAL ROUTE 2-4 TIMESDAILY  . hydrocortisone 2.5 % cream hydrocortisone 2.5 % topical cream with perineal applicator  APPLY THIN LAYER EXTERNALLY TO THE AFFECTED AREA 2 TO 4 TIMES DAILY  . ibuprofen (ADVIL) 800 MG tablet TAKE 1 TABLET BY MOUTH EVERY 8 HOURS AS NEEDED  . Multiple Vitamins-Minerals (ADULT GUMMY) CHEW Chew 3 capsules by mouth daily.  . ondansetron (ZOFRAN) 8 MG tablet Take 1 tablet (8 mg total) by mouth 2 (two) times daily as needed (Nausea or  vomiting).  Marland Kitchen oxyCODONE (OXY IR/ROXICODONE) 5 MG immediate release tablet Take 1-2 tablets (5-10 mg total) by mouth every 6 (six) hours as needed for severe pain.  Marland Kitchen prochlorperazine (COMPAZINE) 10 MG tablet Take 1 tablet (10 mg total) by mouth every 6 (six) hours as needed (Nausea or vomiting).  . RECTICARE 5 % CREA APPLY EXTERNALLY TO THE AFFECTED AREA FOUR TIMES DAILY FOR 14 DAYS AS NEEDED  . TRIUMEQ 600-50-300 MG tablet TAKE 1 TABLET BY MOUTH DAILY  . zolpidem (AMBIEN) 10 MG tablet Take 1 tablet (10 mg total) by mouth at bedtime as needed.   No facility-administered encounter medications on file as of 08/23/2019.     Patient Active Problem List   Diagnosis Date Noted  . Anal cancer ([place]) 07/26/2019  . Rectal mass 07/14/2019  . Constipation 07/14/2019  . Anal fissure 07/14/2019  . Healthcare maintenance 06/30/2018  . Left knee pain 12/30/2017  . Left shoulder pain 12/30/2017  . Closed fracture of right tibial plateau 02/18/2017  . Benign hypertension 02/15/2017  . Screening examination for venereal disease 03/06/2015  . Encounter for long-term (current) use of medications 03/06/2015  . Elevated LFTs 12/11/2014  . Abnormal ultrasound 12/11/2014  . Abdominal pain, epigastric 12/11/2014  . ASCUS of cervix with negative high risk HPV 10/31/2013  . Gall bladder disease 02/03/2013  . 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  . Compression fracture of L3 lumbar vertebra 07/01/2011  . Thyroid dysfunction 07/01/2011  .  Anemia 07/01/2011  . Sore throat 04/02/2011  . Hyperparathyroidism 02/10/2011  . Osteopenia 02/03/2011  . PAP SMEAR, LGSIL, ABNORMAL 12/21/2009  . COUGH 07/18/2009  . Chronic hepatitis C virus infection ([place]) 12/19/2008  . LEUKORRHEA 04/07/2008  . FATIGUE 04/07/2008  . BACK PAIN, LUMBAR 11/10/2007  . ALLERGIC RHINITIS 07/09/2007  . PNEUMOCYSTIS PNEUMONIA 05/07/2007  . HYPERTENSION 05/07/2007  . DENTAL CARIES 05/07/2007  . INSOMNIA 05/07/2007    . PNEUMONIA, HX OF 05/07/2007  . HERPES ZOSTER, HX OF 05/07/2007  . Human immunodeficiency virus (HIV) disease ([place]) 08/04/2006     Health Maintenance Due  Topic Date Due  . PNA vac Low Risk Adult (2 of 2 - PPSV23) 11/17/2018     Review of Systems 12 ponit ros is negative Physical Exam   BP 132/85   Pulse 83   Temp 98 F (36.7 C) (Oral)   Wt 163 lb (73.9 kg)   BMI 31.83 kg/m   Physical Exam  Constitutional:  oriented to person, place, and time. appears well-developed and well-nourished. No distress.  HENT: [place]/AT, PERRLA, no scleral icterus Mouth/Throat: Oropharynx is clear and moist. No oropharyngeal exudate.  Cardiovascular: Normal rate, regular rhythm and normal heart sounds. Exam reveals no gallop and no friction rub.  No murmur heard.  Pulmonary/Chest: Effort normal and breath sounds normal. No respiratory distress.  has no wheezes.  Neck = supple, no nuchal rigidity Abdominal: Soft. Bowel sounds are normal.  exhibits no distension. There is no tenderness.  Lymphadenopathy: no cervical adenopathy. No axillary adenopathy Neurological: alert and oriented to person, place, and time.  Skin: Skin is warm and dry. No rash noted. No erythema.  Psychiatric: a normal mood and affect.  behavior is normal.   Lab Results  Component Value Date   CD4TCELL 19 (L) 08/08/2019   Lab Results  Component Value Date   CD4TABS 237 (L) 08/08/2019   CD4TABS 213 (L) 10/04/2018   CD4TABS 300 (L) 06/30/2018   Lab Results  Component Value Date   HIV1RNAQUANT <20 DETECTED (A) 08/08/2019   Lab Results  Component Value Date   HEPBSAB NEG 12/29/2013   Lab Results  Component Value Date   LABRPR NON-REACTIVE 10/04/2018    CBC Lab Results  Component Value Date   WBC 2.5 (L) 08/22/2019   RBC 3.58 (L) 08/22/2019   HGB 10.6 (L) 08/22/2019   HCT 32.8 (L) 08/22/2019   PLT 158 08/22/2019   MCV 91.6 08/22/2019   MCH 29.6 08/22/2019   MCHC 32.3 08/22/2019   RDW 12.8 08/22/2019    LYMPHSABS 0.2 (L) 08/22/2019   MONOABS 0.1 08/22/2019   EOSABS 0.0 08/22/2019    BMET Lab Results  Component Value Date   NA 144 08/22/2019   K 3.7 08/22/2019   CL 110 08/22/2019   CO2 24 08/22/2019   GLUCOSE 83 08/22/2019   BUN 19 08/22/2019   CREATININE 0.90 08/22/2019   CALCIUM 9.4 08/22/2019   GFRNONAA >60 08/22/2019   GFRAA >60 08/22/2019    Assessment and Plan  HIV disease = continue on current regimen  Low cd 4 count = likely from chemo, she is only to be on it for additional week per her report. We will check with oncology how long they suspect her counts/treatment to continue so that we can gauge if she needs PJP proph. She is intolerant to bactrim and dapsone which will make it difficult.  Anal cancer = continue with regimen as outlined by onc/rad onc team.  Health maintenance =  wants to do covid vaccine after she finished chemo treatment

## 2019-08-23 NOTE — Telephone Encounter (Signed)
No 4/5 los. No changes made to pt's schedule.  

## 2019-08-24 ENCOUNTER — Ambulatory Visit
Admission: RE | Admit: 2019-08-24 | Discharge: 2019-08-24 | Disposition: A | Discharge status: Home / Self Care | Payer: Medicare HMO | Source: Ambulatory Visit | Attending: Radiation Oncology | Admitting: Radiation Oncology

## 2019-08-24 ENCOUNTER — Other Ambulatory Visit: Payer: Self-pay

## 2019-08-24 DIAGNOSIS — C20 Malignant neoplasm of rectum: Secondary | ICD-10-CM | POA: Diagnosis not present

## 2019-08-24 DIAGNOSIS — Z510 Encounter for antineoplastic radiation therapy: Secondary | ICD-10-CM | POA: Diagnosis not present

## 2019-08-24 DIAGNOSIS — C210 Malignant neoplasm of anus, unspecified: Secondary | ICD-10-CM | POA: Diagnosis not present

## 2019-08-25 ENCOUNTER — Other Ambulatory Visit: Payer: Self-pay

## 2019-08-25 ENCOUNTER — Other Ambulatory Visit: Payer: Self-pay | Admitting: Radiation Oncology

## 2019-08-25 ENCOUNTER — Ambulatory Visit
Admission: RE | Admit: 2019-08-25 | Discharge: 2019-08-25 | Disposition: A | Discharge status: Home / Self Care | Payer: Medicare HMO | Source: Ambulatory Visit | Attending: Radiation Oncology | Admitting: Radiation Oncology

## 2019-08-25 DIAGNOSIS — Z510 Encounter for antineoplastic radiation therapy: Secondary | ICD-10-CM | POA: Diagnosis not present

## 2019-08-25 DIAGNOSIS — C20 Malignant neoplasm of rectum: Secondary | ICD-10-CM | POA: Diagnosis not present

## 2019-08-25 DIAGNOSIS — C210 Malignant neoplasm of anus, unspecified: Secondary | ICD-10-CM | POA: Diagnosis not present

## 2019-08-25 MED ORDER — FLUCONAZOLE 150 MG PO TABS: 150.0000 mg | ORAL_TABLET | Freq: Every day | ORAL | 0 refills | Status: DC

## 2019-08-26 ENCOUNTER — Other Ambulatory Visit: Payer: Self-pay

## 2019-08-26 ENCOUNTER — Ambulatory Visit
Admission: RE | Admit: 2019-08-26 | Discharge: 2019-08-26 | Disposition: A | Discharge status: Home / Self Care | Payer: Medicare HMO | Source: Ambulatory Visit | Attending: Radiation Oncology | Admitting: Radiation Oncology

## 2019-08-26 DIAGNOSIS — Z510 Encounter for antineoplastic radiation therapy: Secondary | ICD-10-CM | POA: Diagnosis not present

## 2019-08-26 DIAGNOSIS — C210 Malignant neoplasm of anus, unspecified: Secondary | ICD-10-CM | POA: Diagnosis not present

## 2019-08-26 DIAGNOSIS — C20 Malignant neoplasm of rectum: Secondary | ICD-10-CM | POA: Diagnosis not present

## 2019-08-28 NOTE — Progress Notes (Signed)
Vanessa Beard   [phone] [phone]   Clinic Follow up Note   Patient Care Team: Seward Carol, MD as PCP - General (Internal Medicine) Carlyle Basques, MD as PCP - Infectious Diseases (Infectious Diseases) Binnie Rail, DC as Referring Physician (Chiropractic Medicine) Jonnie Finner, RN as Oncology Nurse Navigator 08/29/2019  CHIEF COMPLAINT: F/u anal cancer   SUMMARY OF ONCOLOGIC HISTORY: Oncology History Overview Note  Cancer Staging Anal cancer ([place]) Staging form: Anus, AJCC 8th Edition - Clinical: Stage IV (cT2, cN0, cM1) - Signed by Truitt Merle, MD on 08/12/2019    Anal cancer ([place])  07/19/2019 Procedure   Colonoscopy by Dr Havery Moros 07/19/19  IMPRESSION - Ulcer noted at the anal canal in posterior midline canal that extends into the distal rectum on digital rectal exam, nodular and somewhat hard to palpation. Endoscopic images show nodular tissue in the area, concerning for malignant ulcer. - The examined portion of the ileum was normal. - One 3 mm polyp at the hepatic flexure, removed with a cold snare. Resected and retrieved. - One 5 mm polyp in the transverse colon, removed with a cold snare. Resected and retrieved. - Diverticulosis in the ascending colon. - The examination was otherwise normal.   07/19/2019 Initial Biopsy   Diagnosis 07/19/19 1. Transverse Colon Polyp, hepatic flexure (2) - TUBULAR ADENOMA (1 OF 3 FRAGMENTS) - BENIGN COLONIC MUCOSA (2 OF 3 FRAGMENTS) - NO HIGH GRADE DYSPLASIA OR MALIGNANCY IDENTIFIED 2. Rectum, biopsy, distal rectal anal canal - SQUAMOUS CELL CARCINOMA - SEE COMMENT   07/26/2019 Initial Diagnosis   Anal cancer ([place])   07/26/2019 Cancer Staging   Staging form: Anus, AJCC 8th Edition - Clinical: Stage IV (cT2, cN1c, cM1) - Signed by Truitt Merle, MD on 08/12/2019   08/04/2019 PET scan   IMPRESSION: Hypermetabolic anal soft tissue mass, consistent with known primary anal carcinoma.   Sub-cm  hypermetabolic lymph nodes in posterior perirectal space, right inguinal region, right iliac chain, and gastrohepatic ligament, suspicious for metastatic disease.   Small hypermetabolic left axillary lymph nodes. This would be unusual location for metastatic anal carcinoma. Recommend clinical correlation for possibility the patient has had recent COVID vaccination in the left arm, which could explain this finding.   Multifocal airspace disease in both lower lungs with marked hypermetabolic activity. This favors infectious or inflammatory etiology, and is not typical for pulmonary metastases. Short-term follow-up by chest CT is recommended.   08/15/2019 -  Radiation Therapy   Concurrent ChemoRT with Dr. Lisbeth Renshaw starting 08/15/19   08/15/2019 -  Chemotherapy   Concurrent ChemoRT with Mitomycin and 5FU on week 1 and 5 starting 08/15/19      CURRENT THERAPY:  Concurrent chemoRT with Mitomycin and 5FU on week 1 and 5 for 6 weeks starting 08/15/19.  INTERVAL HISTORY: Vanessa Beard returns for f/u as scheduled. She begins week 3 of treatment. She feels well. Appetite is good. Energy waxes and wanes, she is usually more tired in latter part of the day. Remains functional and active at home. She has up to 2 soft to loose BM daily, no n/v/c. For mild rectal pain she takes 1/2 tab norco during the day and oxycodone PRN at night. Denies mucositis. Denies bleeding. Denies fever, chills, cough, chest pain, dyspnea.    MEDICAL HISTORY:  Past Medical History:  Diagnosis Date  . Allergic rhinitis   . Biliary colic   . Gall bladder disease \  . Hepatitis C   . Herpes   .  HIV infection ([place])   . Hyperparathyroidism ([place])   . Hypertension   . Insomnia   . Osteoporosis   . Pneumonia    2010    SURGICAL HISTORY: Past Surgical History:  Procedure Laterality Date  . CHOLECYSTECTOMY N/A 01/09/2015   Procedure: LAPAROSCOPIC CHOLECYSTECTOMY;  Surgeon: [name], MD;  Location: [facility];  Service:  General;  Laterality: N/A;  . ECTOPIC PREGNANCY SURGERY    . IR FLUORO GUIDE CV LINE LEFT  08/15/2019  . ORIF TIBIA PLATEAU Right 02/18/2017   Procedure: OPEN REDUCTION INTERNAL FIXATION (ORIF) RIGHT TIBIAL PLATEAU;  Surgeon: Leandrew Koyanagi, MD;  Location: [facility];  Service: Orthopedics;  Laterality: Right;  . SHOULDER SURGERY Right 12/2014    I have reviewed the social history and family history with the patient and they are unchanged from previous note.  ALLERGIES:  is allergic to dapsone; retrovir [zidovudine]; and sulfamethoxazole-trimethoprim.  MEDICATIONS:  Current Outpatient Medications  Medication Sig Dispense Refill  . acetaminophen (TYLENOL) 500 MG tablet Take 1,000 mg by mouth every 6 (six) hours as needed for moderate pain or headache.    Marland Kitchen amLODipine (NORVASC) 10 MG tablet Take 1 tablet (10 mg total) by mouth daily. 90 tablet 3  . HYDROcodone-acetaminophen (NORCO) 5-325 MG tablet Take 1-2 tablets by mouth every 6 (six) hours as needed for moderate pain. 60 tablet 0  . oxyCODONE (OXY IR/ROXICODONE) 5 MG immediate release tablet Take 1-2 tablets (5-10 mg total) by mouth every 6 (six) hours as needed for severe pain. 60 tablet 0  . TRIUMEQ 600-50-300 MG tablet TAKE 1 TABLET BY MOUTH DAILY (Patient taking differently: Take 1 tablet by mouth daily. ) 30 tablet 3  . zolpidem (AMBIEN) 10 MG tablet Take 1 tablet (10 mg total) by mouth at bedtime as needed. (Patient taking differently: Take 10 mg by mouth at bedtime as needed for sleep. ) 30 tablet 1  . AMBULATORY NON FORMULARY MEDICATION Nitroglycerine ointment 0.125 %  Apply a pea sized amount internally four times daily. (Patient not taking: Reported on 08/25/2019) 30 g 1  . fluconazole (DIFLUCAN) 150 MG tablet Take 1 tablet (150 mg total) by mouth daily. 2 tablet 0  . ibuprofen (ADVIL) 800 MG tablet TAKE 1 TABLET BY MOUTH EVERY 8 HOURS AS NEEDED (Patient not taking: Reported on 08/25/2019) 90 tablet 0  . ondansetron (ZOFRAN) 8 MG tablet Take 1  tablet (8 mg total) by mouth 2 (two) times daily as needed (Nausea or vomiting). 30 tablet 1  . prochlorperazine (COMPAZINE) 10 MG tablet Take 1 tablet (10 mg total) by mouth every 6 (six) hours as needed (Nausea or vomiting). 30 tablet 1   No current facility-administered medications for this visit.    PHYSICAL EXAMINATION: ECOG PERFORMANCE STATUS: 1 - Symptomatic but completely ambulatory  Vitals:   08/29/19 1259  BP: (!) 141/87  Pulse: 91  Resp: 20  Temp: 98.2 F (36.8 C)  SpO2: 98%   Filed Weights   08/29/19 1259  Weight: 164 lb 3.2 oz (74.5 kg)    GENERAL:alert, no distress and comfortable SKIN: no rash  EYES:  sclera clear OROPHARYNX: no thrush or ulcers LUNGS: clear with normal breathing effort HEART: regular rate & rhythm, no lower extremity edema RECTAL: external exam shows mild perineal hyperpigmentation and erythema, clear rectal discharge noted. No ulceration or skin breakdown  NEURO: alert & oriented x 3 with fluent speech, normal gait PICC without erythema.   LABORATORY DATA:  I have reviewed the data as  listed CBC Latest Ref Rng & Units 08/29/2019 08/22/2019 08/15/2019  WBC 4.0 - 10.5 K/uL 1.3(L) 2.5(L) 5.9  Hemoglobin 12.0 - 15.0 g/dL 10.6(L) 10.6(L) 11.4(L)  Hematocrit 36.0 - 46.0 % 32.5(L) 32.8(L) 34.9(L)  Platelets 150 - 400 K/uL 44(L) 158 185     CMP Latest Ref Rng & Units 08/29/2019 08/22/2019 08/15/2019  Glucose 70 - 99 mg/dL 119(H) 83 101(H)  BUN 8 - 23 mg/dL 14 19 17   Creatinine 0.44 - 1.00 mg/dL 0.83 0.90 0.88  Sodium 135 - 145 mmol/L 141 144 144  Potassium 3.5 - 5.1 mmol/L 3.5 3.7 3.5  Chloride 98 - 111 mmol/L 107 110 111  CO2 22 - 32 mmol/L 26 24 23   Calcium 8.9 - 10.3 mg/dL 9.6 9.4 9.4  Total Protein 6.5 - 8.1 g/dL 8.2(H) 8.0 8.4(H)  Total Bilirubin 0.3 - 1.2 mg/dL 0.4 0.5 0.7  Alkaline Phos 38 - 126 U/L 83 82 89  AST 15 - 41 U/L 27 22 30   ALT 0 - 44 U/L 16 12 14       RADIOGRAPHIC STUDIES: I have personally reviewed the radiological  images as listed and agreed with the findings in the report. No results found.   ASSESSMENT & PLAN: Vanessa Beard is a 69 y.o. 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 Her biopsy results show Squamous Cell Carcinoma.This is consistent with anal cancer. She has HIV, which is a high risk for anal cancer. -PET from 08/04/19 showedhypermetabolicanal mass, hypermetabolicLNsin posterior perirectal space, right inguinal region, right iliac chain and gastrohepatic ligament suspicious for metastasis. Scan also showed hypermetabolic left axillary LNs, which is unusual metastatic pattern. Shedenies recent injury or injections in the left arm.A biopsy has been recommended torule outdistant metastasis.  -Although metastatic disease is not ruled out at this point,Dr. Lisbeth Renshaw is able to cover thegastrohepatic lymph node in the radiation field.  -She began concurrent chemoRT with mitomycin and 5FU (week 1 and week 5) on 08/15/19. She is tolerating treatment well overall -Plan to repeat CT chest to evaluate her left axillary lymph node after she completes chemoradiation. -She plans to have her EUS biopsy of the gastrohepatic ligament node with Dr. Ardis Hughs on 09/08/19.   2. Hep C, HIV -She underwentsuccessfulHep C treatment 2 years ago [id number]) -Her HIV was contracted through sex. Her HIV is well controlled. She will continue to f/u ID Dr Graylon Good.Last CD4 count was 386 on 02/07/2019. -Compliant with Triumeq  3. Comorbidities: HTN, Hyperparathyroidism, Osteoporosis -Continue medications and f/u with PCP.  -BP 141/87 today   4. Social Support  -She is divorced, lives alone and has 2 adult children who live in [place]  -She is getting bachelors degree in  [place] through [employer] schooling, she plans to complete in 10/2019 but may postpone her final classes.  -She is currently working at [employer] as [employer] and works 8-9 hours every day. She notes her work is flexible to take time off if needed.   Disposition:  Vanessa Beard appears stable. She is day 15 of 5FU/mitomycin and continues daily RT. She is tolerating treatment well overall with mild skin changes, loose stool, and fatigue. She remains functional. Labs reviewed, she is pancytopenic with Hgb 10.6, PLT 44K, and ANC 0.9. We reviewed neutropenic and thrombocytopenic precautions. Will repeat labs on 08/31/19 to  monitor. If [place] <0.5 will hold radiation. Rad onc is aware.   Her PICC line was not pulled with day 5 pump d/c. Due to the infection risk, it will be pulled today in clinic.   We will see her back with labs next week.   All questions were answered. The patient knows to call the clinic with any problems, questions or concerns. No barriers to learning was detected.     Alla Feeling, NP 08/29/19

## 2019-08-29 ENCOUNTER — Inpatient Hospital Stay (HOSPITAL_BASED_OUTPATIENT_CLINIC_OR_DEPARTMENT_OTHER): Payer: Medicare HMO | Admitting: Nurse Practitioner

## 2019-08-29 ENCOUNTER — Other Ambulatory Visit: Payer: Self-pay

## 2019-08-29 ENCOUNTER — Ambulatory Visit
Admission: RE | Admit: 2019-08-29 | Discharge: 2019-08-29 | Disposition: A | Discharge status: Home / Self Care | Payer: Medicare HMO | Source: Ambulatory Visit | Attending: Radiation Oncology | Admitting: Radiation Oncology

## 2019-08-29 ENCOUNTER — Inpatient Hospital Stay: Payer: Medicare HMO

## 2019-08-29 ENCOUNTER — Encounter: Payer: Self-pay | Admitting: Nurse Practitioner

## 2019-08-29 VITALS — BP 141/87 | HR 91 | Temp 98.2°F | Resp 20 | Ht 60.0 in | Wt 164.2 lb

## 2019-08-29 DIAGNOSIS — R69 Illness, unspecified: Secondary | ICD-10-CM | POA: Diagnosis not present

## 2019-08-29 DIAGNOSIS — C210 Malignant neoplasm of anus, unspecified: Secondary | ICD-10-CM

## 2019-08-29 DIAGNOSIS — I10 Essential (primary) hypertension: Secondary | ICD-10-CM | POA: Diagnosis not present

## 2019-08-29 DIAGNOSIS — Z5111 Encounter for antineoplastic chemotherapy: Secondary | ICD-10-CM | POA: Diagnosis not present

## 2019-08-29 DIAGNOSIS — J984 Other disorders of lung: Secondary | ICD-10-CM | POA: Diagnosis not present

## 2019-08-29 DIAGNOSIS — Z510 Encounter for antineoplastic radiation therapy: Secondary | ICD-10-CM | POA: Diagnosis not present

## 2019-08-29 DIAGNOSIS — C211 Malignant neoplasm of anal canal: Secondary | ICD-10-CM | POA: Diagnosis not present

## 2019-08-29 DIAGNOSIS — K5730 Diverticulosis of large intestine without perforation or abscess without bleeding: Secondary | ICD-10-CM | POA: Diagnosis not present

## 2019-08-29 DIAGNOSIS — D61818 Other pancytopenia: Secondary | ICD-10-CM | POA: Diagnosis not present

## 2019-08-29 DIAGNOSIS — D123 Benign neoplasm of transverse colon: Secondary | ICD-10-CM | POA: Diagnosis not present

## 2019-08-29 DIAGNOSIS — R197 Diarrhea, unspecified: Secondary | ICD-10-CM | POA: Diagnosis not present

## 2019-08-29 DIAGNOSIS — C20 Malignant neoplasm of rectum: Secondary | ICD-10-CM | POA: Diagnosis not present

## 2019-08-29 DIAGNOSIS — E213 Hyperparathyroidism, unspecified: Secondary | ICD-10-CM | POA: Diagnosis not present

## 2019-08-29 LAB — CBC WITH DIFFERENTIAL (CANCER CENTER ONLY)
Abs Immature Granulocytes: 0 10*3/uL (ref 0.00–0.07)
Basophils Absolute: 0 10*3/uL (ref 0.0–0.1)
Basophils Relative: 1 %
Eosinophils Absolute: 0.1 10*3/uL (ref 0.0–0.5)
Eosinophils Relative: 5 %
HCT: 32.5 % — ABNORMAL LOW (ref 36.0–46.0)
Hemoglobin: 10.6 g/dL — ABNORMAL LOW (ref 12.0–15.0)
Immature Granulocytes: 0 %
Lymphocytes Relative: 12 %
Lymphs Abs: 0.2 10*3/uL — ABNORMAL LOW (ref 0.7–4.0)
MCH: 29.7 pg (ref 26.0–34.0)
MCHC: 32.6 g/dL (ref 30.0–36.0)
MCV: 91 fL (ref 80.0–100.0)
Monocytes Absolute: 0.2 10*3/uL (ref 0.1–1.0)
Monocytes Relative: 14 %
Neutro Abs: 0.9 10*3/uL — ABNORMAL LOW (ref 1.7–7.7)
Neutrophils Relative %: 68 %
Platelet Count: 44 10*3/uL — ABNORMAL LOW (ref 150–400)
RBC: 3.57 MIL/uL — ABNORMAL LOW (ref 3.87–5.11)
RDW: 12.4 % (ref 11.5–15.5)
WBC Count: 1.3 10*3/uL — ABNORMAL LOW (ref 4.0–10.5)
nRBC: 0 % (ref 0.0–0.2)

## 2019-08-29 LAB — CMP (CANCER CENTER ONLY)
ALT: 16 U/L (ref 0–44)
AST: 27 U/L (ref 15–41)
Albumin: 3.5 g/dL (ref 3.5–5.0)
Alkaline Phosphatase: 83 U/L (ref 38–126)
Anion gap: 8 (ref 5–15)
BUN: 14 mg/dL (ref 8–23)
CO2: 26 mmol/L (ref 22–32)
Calcium: 9.6 mg/dL (ref 8.9–10.3)
Chloride: 107 mmol/L (ref 98–111)
Creatinine: 0.83 mg/dL (ref 0.44–1.00)
GFR, Est AFR Am: >60 mL/min (ref >60)
GFR, Estimated: >60 mL/min (ref >60)
Glucose, Bld: 119 mg/dL — ABNORMAL HIGH (ref 70–99)
Potassium: 3.5 mmol/L (ref 3.5–5.1)
Sodium: 141 mmol/L (ref 135–145)
Total Bilirubin: 0.4 mg/dL (ref 0.3–1.2)
Total Protein: 8.2 g/dL — ABNORMAL HIGH (ref 6.5–8.1)

## 2019-08-29 NOTE — Patient Instructions (Signed)
PICC Removal, Adult, Care After This sheet gives you information about how to care for yourself after your procedure. Your health care provider may also give you more specific instructions. If you have problems or questions, contact your health care provider. What can I expect after the procedure? After your procedure, it is common to have:  Tenderness or soreness.  Redness, swelling, or a scab where the PICC was removed (exit site). Follow these instructions at home: For the first 24 hours after the procedure   Keep the bandage (dressing) on the exit site clean and dry. Do not remove the dressing until your health care provider tells you to do so.  Check your arm often for signs and symptoms of an infection. Check for: ? A red streak that spreads away from the dressing. ? Blood or fluid that you can see on the dressing. ? More redness or swelling.  Do not lift anything heavy or do activities that require great effort until your health care provider says it is okay. You should avoid: ? Lifting weights. ? Yard work. ? Any physical activity with repetitive arm movement.  Watch closely for any signs of an air bubble in the vein (air embolism). This is a rare but serious complication. If you have signs of air embolism, call 911 immediately and lie down on your left side to keep the air from moving into the lungs. Signs of an air embolism include: ? Difficulty breathing. ? Chest pain. ? Coughing or wheezing. ? Skin that is pale, blue, cold, or clammy. ? Rapid pulse. ? Rapid breathing. ? Fainting. After 24 Hours have passed:  Remove your dressing as told by your health care provider. Make sure you wash your hands with soap and water before and after you change the dressing. If soap and water are not available, use hand sanitizer.  Return to your normal activities as told by your health care provider.  A small scab may develop over the exit site. Do not pick at the scab.  When  bathing or showering, gently wash the exit site with soap and water. Pat it dry.  Watch for signs of infection, such as: ? Fever or chills. ? Swollen glands under the arm. ? More redness, swelling, or soreness in the arm. ? Blood, fluid, or pus coming from the exit site. ? Warmth or a bad smell at the exit site. ? A red streak spreading away from the exit site. General instructions  Take over-the-counter and prescription medicines only as told by your health care provider. Do not take any new medicines without checking with your health care provider first.  If you were prescribed an antibiotic medicine, apply or take it as told by your health care provider. Do not stop using the antibiotic even if your condition improves.  Keep all follow-up visits as told by your health care provider. This is important. Contact a health care provider if:  You have a fever or chills.  You have soreness, redness, or swelling on your exit site, and it gets worse.  You have swollen glands under your arm.  You have any of the following symptoms at your exit site: ? Blood, fluid, or pus. ? Unusual warmth. ? A bad smell. ? A red streak spreading away from the exit site. Get help right away if:  You have numbness or tingling in your fingers, hand, or arm.  Your arm looks blue and feels cold.  You have signs of an air embolism, such   as: ? Difficulty breathing. ? Chest pain. ? Coughing or wheezing. ? Skin that is pale, blue, cold, or clammy. ? Rapid pulse. ? Rapid breathing. ? Fainting. These symptoms may represent a serious problem that is an emergency. Do not wait to see if the symptoms will go away. Get medical help right away. Call your local emergency services (911 in the [place] Do not drive yourself to the hospital. Summary  After your procedure, it is common to have tenderness or soreness, redness, swelling, or a scab at the exit site.  Keep the dressing over the exit site clean and dry.  Do not remove the dressing until your health care provider tells you to do so.  Do not lift anything heavy or do activities that require great effort until your health care provider says it is okay.  Watch closely for any signs of an air embolism. If you have signs of air embolism, call 911 immediately and lie down on your left side. This information is not intended to replace advice given to you by your health care provider. Make sure you discuss any questions you have with your health care provider. Document Revised: 04/17/2017 Document Reviewed: 07/01/2016 Elsevier Patient Education  2020 [employer] Inc.  

## 2019-08-29 NOTE — Progress Notes (Signed)
Right arm PICC line removed without problems ,Secure dressing dry and intact.  Education provided for aftercare and documented in AVS.

## 2019-08-30 ENCOUNTER — Telehealth: Payer: Self-pay | Admitting: Nurse Practitioner

## 2019-08-30 ENCOUNTER — Other Ambulatory Visit: Payer: Self-pay

## 2019-08-30 ENCOUNTER — Ambulatory Visit
Admission: RE | Admit: 2019-08-30 | Discharge: 2019-08-30 | Disposition: A | Discharge status: Home / Self Care | Payer: Medicare HMO | Source: Ambulatory Visit | Attending: Radiation Oncology | Admitting: Radiation Oncology

## 2019-08-30 ENCOUNTER — Encounter (HOSPITAL_COMMUNITY): Payer: Self-pay | Admitting: Gastroenterology

## 2019-08-30 DIAGNOSIS — C20 Malignant neoplasm of rectum: Secondary | ICD-10-CM | POA: Diagnosis not present

## 2019-08-30 DIAGNOSIS — Z510 Encounter for antineoplastic radiation therapy: Secondary | ICD-10-CM | POA: Diagnosis not present

## 2019-08-30 DIAGNOSIS — C210 Malignant neoplasm of anus, unspecified: Secondary | ICD-10-CM | POA: Diagnosis not present

## 2019-08-30 NOTE — Telephone Encounter (Signed)
Scheduled appt per 4/13 los.  Spoke with pt and she is aware of the appt date and time.

## 2019-08-31 ENCOUNTER — Ambulatory Visit: Admission: RE | Admit: 2019-08-31 | Payer: Medicare HMO | Source: Ambulatory Visit

## 2019-08-31 ENCOUNTER — Telehealth: Payer: Self-pay

## 2019-08-31 ENCOUNTER — Inpatient Hospital Stay: Payer: Medicare HMO

## 2019-08-31 ENCOUNTER — Other Ambulatory Visit: Payer: Self-pay

## 2019-08-31 DIAGNOSIS — D123 Benign neoplasm of transverse colon: Secondary | ICD-10-CM | POA: Diagnosis not present

## 2019-08-31 DIAGNOSIS — J984 Other disorders of lung: Secondary | ICD-10-CM | POA: Diagnosis not present

## 2019-08-31 DIAGNOSIS — C210 Malignant neoplasm of anus, unspecified: Secondary | ICD-10-CM

## 2019-08-31 DIAGNOSIS — C211 Malignant neoplasm of anal canal: Secondary | ICD-10-CM | POA: Diagnosis not present

## 2019-08-31 DIAGNOSIS — I10 Essential (primary) hypertension: Secondary | ICD-10-CM | POA: Diagnosis not present

## 2019-08-31 DIAGNOSIS — Z5111 Encounter for antineoplastic chemotherapy: Secondary | ICD-10-CM | POA: Diagnosis not present

## 2019-08-31 DIAGNOSIS — K5730 Diverticulosis of large intestine without perforation or abscess without bleeding: Secondary | ICD-10-CM | POA: Diagnosis not present

## 2019-08-31 DIAGNOSIS — R69 Illness, unspecified: Secondary | ICD-10-CM | POA: Diagnosis not present

## 2019-08-31 DIAGNOSIS — R197 Diarrhea, unspecified: Secondary | ICD-10-CM | POA: Diagnosis not present

## 2019-08-31 DIAGNOSIS — D61818 Other pancytopenia: Secondary | ICD-10-CM | POA: Diagnosis not present

## 2019-08-31 DIAGNOSIS — E213 Hyperparathyroidism, unspecified: Secondary | ICD-10-CM | POA: Diagnosis not present

## 2019-08-31 LAB — CMP (CANCER CENTER ONLY)
ALT: 13 U/L (ref 0–44)
AST: 19 U/L (ref 15–41)
Albumin: 3.3 g/dL — ABNORMAL LOW (ref 3.5–5.0)
Alkaline Phosphatase: 77 U/L (ref 38–126)
Anion gap: 6 (ref 5–15)
BUN: 17 mg/dL (ref 8–23)
CO2: 25 mmol/L (ref 22–32)
Calcium: 9.2 mg/dL (ref 8.9–10.3)
Chloride: 108 mmol/L (ref 98–111)
Creatinine: 0.87 mg/dL (ref 0.44–1.00)
GFR, Est AFR Am: >60 mL/min (ref >60)
GFR, Estimated: >60 mL/min (ref >60)
Glucose, Bld: 98 mg/dL (ref 70–99)
Potassium: 3.6 mmol/L (ref 3.5–5.1)
Sodium: 139 mmol/L (ref 135–145)
Total Bilirubin: 0.5 mg/dL (ref 0.3–1.2)
Total Protein: 7.9 g/dL (ref 6.5–8.1)

## 2019-08-31 LAB — CBC WITH DIFFERENTIAL (CANCER CENTER ONLY)
Abs Immature Granulocytes: 0.01 10*3/uL (ref 0.00–0.07)
Basophils Absolute: 0 10*3/uL (ref 0.0–0.1)
Basophils Relative: 1 %
Eosinophils Absolute: 0.1 10*3/uL (ref 0.0–0.5)
Eosinophils Relative: 10 %
HCT: 32.7 % — ABNORMAL LOW (ref 36.0–46.0)
Hemoglobin: 10.8 g/dL — ABNORMAL LOW (ref 12.0–15.0)
Immature Granulocytes: 1 %
Lymphocytes Relative: 15 %
Lymphs Abs: 0.1 10*3/uL — ABNORMAL LOW (ref 0.7–4.0)
MCH: 30.3 pg (ref 26.0–34.0)
MCHC: 33 g/dL (ref 30.0–36.0)
MCV: 91.9 fL (ref 80.0–100.0)
Monocytes Absolute: 0.2 10*3/uL (ref 0.1–1.0)
Monocytes Relative: 25 %
Neutro Abs: 0.3 10*3/uL — CL (ref 1.7–7.7)
Neutrophils Relative %: 48 %
Platelet Count: 45 10*3/uL — ABNORMAL LOW (ref 150–400)
RBC: 3.56 MIL/uL — ABNORMAL LOW (ref 3.87–5.11)
RDW: 12.7 % (ref 11.5–15.5)
WBC Count: 0.7 10*3/uL — CL (ref 4.0–10.5)
nRBC: 0 % (ref 0.0–0.2)

## 2019-08-31 NOTE — Telephone Encounter (Signed)
Critical Value: WBC 0.7 Cira Rue, NP notifed

## 2019-09-01 ENCOUNTER — Other Ambulatory Visit: Payer: Self-pay | Admitting: Radiation Oncology

## 2019-09-01 ENCOUNTER — Ambulatory Visit: Admission: RE | Admit: 2019-09-01 | Payer: Medicare HMO | Source: Ambulatory Visit

## 2019-09-01 ENCOUNTER — Telehealth: Payer: Self-pay | Admitting: *Deleted

## 2019-09-01 ENCOUNTER — Ambulatory Visit
Admission: RE | Admit: 2019-09-01 | Discharge: 2019-09-01 | Disposition: A | Discharge status: Home / Self Care | Payer: Medicare HMO | Source: Ambulatory Visit | Attending: Radiation Oncology | Admitting: Radiation Oncology

## 2019-09-01 ENCOUNTER — Other Ambulatory Visit: Payer: Self-pay

## 2019-09-01 DIAGNOSIS — Z510 Encounter for antineoplastic radiation therapy: Secondary | ICD-10-CM | POA: Diagnosis not present

## 2019-09-01 DIAGNOSIS — C210 Malignant neoplasm of anus, unspecified: Secondary | ICD-10-CM

## 2019-09-01 DIAGNOSIS — C20 Malignant neoplasm of rectum: Secondary | ICD-10-CM | POA: Diagnosis not present

## 2019-09-01 LAB — CBC WITH DIFFERENTIAL (CANCER CENTER ONLY)
Abs Immature Granulocytes: 0.01 10*3/uL (ref 0.00–0.07)
Basophils Absolute: 0 10*3/uL (ref 0.0–0.1)
Basophils Relative: 0 %
Eosinophils Absolute: 0.1 10*3/uL (ref 0.0–0.5)
Eosinophils Relative: 9 %
HCT: 31.3 % — ABNORMAL LOW (ref 36.0–46.0)
Hemoglobin: 10.2 g/dL — ABNORMAL LOW (ref 12.0–15.0)
Immature Granulocytes: 2 %
Lymphocytes Relative: 18 %
Lymphs Abs: 0.1 10*3/uL — ABNORMAL LOW (ref 0.7–4.0)
MCH: 30.1 pg (ref 26.0–34.0)
MCHC: 32.6 g/dL (ref 30.0–36.0)
MCV: 92.3 fL (ref 80.0–100.0)
Monocytes Absolute: 0.2 10*3/uL (ref 0.1–1.0)
Monocytes Relative: 25 %
Neutro Abs: 0.3 10*3/uL — CL (ref 1.7–7.7)
Neutrophils Relative %: 46 %
Platelet Count: 44 10*3/uL — ABNORMAL LOW (ref 150–400)
RBC: 3.39 MIL/uL — ABNORMAL LOW (ref 3.87–5.11)
RDW: 13 % (ref 11.5–15.5)
WBC Count: 0.7 10*3/uL — CL (ref 4.0–10.5)
nRBC: 0 % (ref 0.0–0.2)

## 2019-09-01 NOTE — Telephone Encounter (Signed)
Pt WBC 0.7 and ANC 0.3. Provider Worthy Flank, PA made aware

## 2019-09-02 ENCOUNTER — Ambulatory Visit
Admission: RE | Admit: 2019-09-02 | Discharge: 2019-09-02 | Disposition: A | Discharge status: Home / Self Care | Payer: Medicare HMO | Source: Ambulatory Visit | Attending: Radiation Oncology | Admitting: Radiation Oncology

## 2019-09-02 DIAGNOSIS — Z510 Encounter for antineoplastic radiation therapy: Secondary | ICD-10-CM | POA: Diagnosis not present

## 2019-09-02 DIAGNOSIS — C210 Malignant neoplasm of anus, unspecified: Secondary | ICD-10-CM | POA: Diagnosis not present

## 2019-09-02 DIAGNOSIS — C20 Malignant neoplasm of rectum: Secondary | ICD-10-CM | POA: Diagnosis not present

## 2019-09-02 LAB — CBC WITH DIFFERENTIAL (CANCER CENTER ONLY)
Abs Immature Granulocytes: 0 10*3/uL (ref 0.00–0.07)
Basophils Absolute: 0 10*3/uL (ref 0.0–0.1)
Basophils Relative: 1 %
Eosinophils Absolute: 0.1 10*3/uL (ref 0.0–0.5)
Eosinophils Relative: 5 %
HCT: 31.6 % — ABNORMAL LOW (ref 36.0–46.0)
Hemoglobin: 10.4 g/dL — ABNORMAL LOW (ref 12.0–15.0)
Immature Granulocytes: 0 %
Lymphocytes Relative: 13 %
Lymphs Abs: 0.1 10*3/uL — ABNORMAL LOW (ref 0.7–4.0)
MCH: 30.3 pg (ref 26.0–34.0)
MCHC: 32.9 g/dL (ref 30.0–36.0)
MCV: 92.1 fL (ref 80.0–100.0)
Monocytes Absolute: 0.3 10*3/uL (ref 0.1–1.0)
Monocytes Relative: 29 %
Neutro Abs: 0.5 10*3/uL — ABNORMAL LOW (ref 1.7–7.7)
Neutrophils Relative %: 52 %
Platelet Count: 51 10*3/uL — ABNORMAL LOW (ref 150–400)
RBC: 3.43 MIL/uL — ABNORMAL LOW (ref 3.87–5.11)
RDW: 13.2 % (ref 11.5–15.5)
WBC Count: 0.9 10*3/uL — CL (ref 4.0–10.5)
nRBC: 0 % (ref 0.0–0.2)

## 2019-09-04 NOTE — Progress Notes (Deleted)
[facility]   [phone] [phone]   Clinic Follow up Note   Patient Care Team: Seward Carol, MD as PCP - General (Internal Medicine) Carlyle Basques, MD as PCP - Infectious Diseases (Infectious Diseases) Binnie Rail, DC as Referring Physician (Chiropractic Medicine) Jonnie Finner, RN as Oncology Nurse Navigator 09/04/2019  CHIEF COMPLAINT: F/u anal cancer    SUMMARY OF ONCOLOGIC HISTORY: Oncology History Overview Note  Cancer Staging Anal cancer ([place]) Staging form: Anus, AJCC 8th Edition - Clinical: Stage IV (cT2, cN0, cM1) - Signed by Truitt Merle, MD on 08/12/2019    Anal cancer ([place])  07/19/2019 Procedure   Colonoscopy by Dr Havery Moros 07/19/19  IMPRESSION - Ulcer noted at the anal canal in posterior midline canal that extends into the distal rectum on digital rectal exam, nodular and somewhat hard to palpation. Endoscopic images show nodular tissue in the area, concerning for malignant ulcer. - The examined portion of the ileum was normal. - One 3 mm polyp at the hepatic flexure, removed with a cold snare. Resected and retrieved. - One 5 mm polyp in the transverse colon, removed with a cold snare. Resected and retrieved. - Diverticulosis in the ascending colon. - The examination was otherwise normal.   07/19/2019 Initial Biopsy   Diagnosis 07/19/19 1. Transverse Colon Polyp, hepatic flexure (2) - TUBULAR ADENOMA (1 OF 3 FRAGMENTS) - BENIGN COLONIC MUCOSA (2 OF 3 FRAGMENTS) - NO HIGH GRADE DYSPLASIA OR MALIGNANCY IDENTIFIED 2. Rectum, biopsy, distal rectal anal canal - SQUAMOUS CELL CARCINOMA - SEE COMMENT   07/26/2019 Initial Diagnosis   Anal cancer ([place])   07/26/2019 Cancer Staging   Staging form: Anus, AJCC 8th Edition - Clinical: Stage IV (cT2, cN1c, cM1) - Signed by Truitt Merle, MD on 08/12/2019   08/04/2019 PET scan   IMPRESSION: Hypermetabolic anal soft tissue mass, consistent with known primary anal carcinoma.   Sub-cm  hypermetabolic lymph nodes in posterior perirectal space, right inguinal region, right iliac chain, and gastrohepatic ligament, suspicious for metastatic disease.   Small hypermetabolic left axillary lymph nodes. This would be unusual location for metastatic anal carcinoma. Recommend clinical correlation for possibility the patient has had recent COVID vaccination in the left arm, which could explain this finding.   Multifocal airspace disease in both lower lungs with marked hypermetabolic activity. This favors infectious or inflammatory etiology, and is not typical for pulmonary metastases. Short-term follow-up by chest CT is recommended.   08/15/2019 -  Radiation Therapy   Concurrent ChemoRT with Dr. Lisbeth Renshaw starting 08/15/19   08/15/2019 -  Chemotherapy   Concurrent ChemoRT with Mitomycin and 5FU on week 1 and 5 starting 08/15/19      CURRENT THERAPY: Concurrent chemoradiation with Mitomycin and 5FU beginning 08/15/19.   INTERVAL HISTORY: Ms. Vandoorn returns as scheduled. She is day 21 s/p 5FU/mitomycin and continues RT. Radiation was held on 4/14 and 4/15 for ANC 0.3 which improved to 0.5 on 4/16 she she resumed treatment.    REVIEW OF SYSTEMS:   Constitutional: Denies fevers, chills or abnormal weight loss Eyes: Denies blurriness of vision Ears, nose, mouth, throat, and face: Denies mucositis or sore throat Respiratory: Denies cough, dyspnea or wheezes Cardiovascular: Denies palpitation, chest discomfort or lower extremity swelling Gastrointestinal:  Denies nausea, heartburn or change in bowel habits Skin: Denies abnormal skin rashes Lymphatics: Denies new lymphadenopathy or easy bruising Neurological:Denies numbness, tingling or new weaknesses Behavioral/Psych: Mood is stable, no new changes  All other systems were reviewed with the patient  and are negative.  MEDICAL HISTORY:  Past Medical History:  Diagnosis Date  . Allergic rhinitis   . Biliary colic   . Gall bladder  disease \  . Hepatitis C   . Herpes   . HIV infection ([place])   . Hyperparathyroidism ([place])   . Hypertension   . Insomnia   . Osteoporosis   . Pneumonia    2010    SURGICAL HISTORY: Past Surgical History:  Procedure Laterality Date  . CHOLECYSTECTOMY N/A 01/09/2015   Procedure: LAPAROSCOPIC CHOLECYSTECTOMY;  Surgeon: [name], MD;  Location: [facility];  Service: General;  Laterality: N/A;  . ECTOPIC PREGNANCY SURGERY    . IR FLUORO GUIDE CV LINE LEFT  08/15/2019  . ORIF TIBIA PLATEAU Right 02/18/2017   Procedure: OPEN REDUCTION INTERNAL FIXATION (ORIF) RIGHT TIBIAL PLATEAU;  Surgeon: Leandrew Koyanagi, MD;  Location: [facility];  Service: Orthopedics;  Laterality: Right;  . SHOULDER SURGERY Right 12/2014    I have reviewed the social history and family history with the patient and they are unchanged from previous note.  ALLERGIES:  is allergic to dapsone; retrovir [zidovudine]; and sulfamethoxazole-trimethoprim.  MEDICATIONS:  Current Outpatient Medications  Medication Sig Dispense Refill  . acetaminophen (TYLENOL) 500 MG tablet Take 1,000 mg by mouth every 6 (six) hours as needed for moderate pain or headache.    . AMBULATORY NON FORMULARY MEDICATION Nitroglycerine ointment 0.125 %  Apply a pea sized amount internally four times daily. (Patient not taking: Reported on 08/25/2019) 30 g 1  . amLODipine (NORVASC) 10 MG tablet Take 1 tablet (10 mg total) by mouth daily. 90 tablet 3  . fluconazole (DIFLUCAN) 150 MG tablet Take 1 tablet (150 mg total) by mouth daily. 2 tablet 0  . HYDROcodone-acetaminophen (NORCO) 5-325 MG tablet Take 1-2 tablets by mouth every 6 (six) hours as needed for moderate pain. 60 tablet 0  . ibuprofen (ADVIL) 800 MG tablet TAKE 1 TABLET BY MOUTH EVERY 8 HOURS AS NEEDED (Patient not taking: Reported on 08/25/2019) 90 tablet 0  . ondansetron (ZOFRAN) 8 MG tablet Take 1 tablet (8 mg total) by mouth 2 (two) times daily as needed (Nausea or vomiting). 30 tablet 1  . oxyCODONE  (OXY IR/ROXICODONE) 5 MG immediate release tablet Take 1-2 tablets (5-10 mg total) by mouth every 6 (six) hours as needed for severe pain. 60 tablet 0  . prochlorperazine (COMPAZINE) 10 MG tablet Take 1 tablet (10 mg total) by mouth every 6 (six) hours as needed (Nausea or vomiting). 30 tablet 1  . TRIUMEQ 600-50-300 MG tablet TAKE 1 TABLET BY MOUTH DAILY (Patient taking differently: Take 1 tablet by mouth daily. ) 30 tablet 3  . zolpidem (AMBIEN) 10 MG tablet Take 1 tablet (10 mg total) by mouth at bedtime as needed. (Patient taking differently: Take 10 mg by mouth at bedtime as needed for sleep. ) 30 tablet 1   No current facility-administered medications for this visit.    PHYSICAL EXAMINATION: ECOG PERFORMANCE STATUS: {CHL ONC ECOG [phone]}  There were no vitals filed for this visit. There were no vitals filed for this visit.  GENERAL:alert, no distress and comfortable SKIN: skin color, texture, turgor are normal, no rashes or significant lesions EYES: normal, Conjunctiva are pink and non-injected, sclera clear OROPHARYNX:no exudate, no erythema and lips, buccal mucosa, and tongue normal  NECK: supple, thyroid normal size, non-tender, without nodularity LYMPH:  no palpable lymphadenopathy in the cervical, axillary or inguinal LUNGS: clear to auscultation and percussion with normal  breathing effort HEART: regular rate & rhythm and no murmurs and no lower extremity edema ABDOMEN:abdomen soft, non-tender and normal bowel sounds Musculoskeletal:no cyanosis of digits and no clubbing  NEURO: alert & oriented x 3 with fluent speech, no focal motor/sensory deficits  LABORATORY DATA:  I have reviewed the data as listed CBC Latest Ref Rng & Units 09/02/2019 09/01/2019 08/31/2019  WBC 4.0 - 10.5 K/uL 0.9(LL) 0.7(LL) 0.7(LL)  Hemoglobin 12.0 - 15.0 g/dL 10.4(L) 10.2(L) 10.8(L)  Hematocrit 36.0 - 46.0 % 31.6(L) 31.3(L) 32.7(L)  Platelets 150 - 400 K/uL 51(L) 44(L) 45(L)     CMP Latest  Ref Rng & Units 08/31/2019 08/29/2019 08/22/2019  Glucose 70 - 99 mg/dL 98 119(H) 83  BUN 8 - 23 mg/dL 17 14 19   Creatinine 0.44 - 1.00 mg/dL 0.87 0.83 0.90  Sodium 135 - 145 mmol/L 139 141 144  Potassium 3.5 - 5.1 mmol/L 3.6 3.5 3.7  Chloride 98 - 111 mmol/L 108 107 110  CO2 22 - 32 mmol/L 25 26 24   Calcium 8.9 - 10.3 mg/dL 9.2 9.6 9.4  Total Protein 6.5 - 8.1 g/dL 7.9 8.2(H) 8.0  Total Bilirubin 0.3 - 1.2 mg/dL 0.5 0.4 0.5  Alkaline Phos 38 - 126 U/L 77 83 82  AST 15 - 41 U/L 19 27 22   ALT 0 - 44 U/L 13 16 12       RADIOGRAPHIC STUDIES: I have personally reviewed the radiological images as listed and agreed with the findings in the report. No results found.   ASSESSMENT & PLAN:  No problem-specific Assessment & Plan notes found for this encounter.   No orders of the defined types were placed in this encounter.  All questions were answered. The patient knows to call the clinic with any problems, questions or concerns. No barriers to learning was detected. I spent {CHL ONC TIME VISIT - [id number] counseling the patient face to face. The total time spent in the appointment was {CHL ONC TIME VISIT - [id number] and more than 50% was on counseling and review of test results     Alla Feeling, NP 09/04/19

## 2019-09-05 ENCOUNTER — Ambulatory Visit
Admission: RE | Admit: 2019-09-05 | Discharge: 2019-09-05 | Disposition: A | Discharge status: Home / Self Care | Payer: Medicare HMO | Source: Ambulatory Visit | Attending: Radiation Oncology | Admitting: Radiation Oncology

## 2019-09-05 ENCOUNTER — Inpatient Hospital Stay: Payer: Medicare HMO

## 2019-09-05 ENCOUNTER — Other Ambulatory Visit (HOSPITAL_COMMUNITY)
Admission: RE | Admit: 2019-09-05 | Discharge: 2019-09-05 | Disposition: A | Discharge status: Home / Self Care | Payer: Medicare HMO | Source: Ambulatory Visit | Attending: Gastroenterology | Admitting: Gastroenterology

## 2019-09-05 ENCOUNTER — Inpatient Hospital Stay (HOSPITAL_BASED_OUTPATIENT_CLINIC_OR_DEPARTMENT_OTHER): Payer: Medicare HMO | Admitting: Nurse Practitioner

## 2019-09-05 ENCOUNTER — Other Ambulatory Visit: Payer: Self-pay

## 2019-09-05 ENCOUNTER — Telehealth: Payer: Self-pay

## 2019-09-05 ENCOUNTER — Inpatient Hospital Stay: Payer: Medicare HMO | Admitting: Nurse Practitioner

## 2019-09-05 ENCOUNTER — Encounter: Payer: Self-pay | Admitting: Nurse Practitioner

## 2019-09-05 VITALS — BP 139/92 | HR 100 | Temp 98.2°F | Resp 18 | Ht 60.0 in | Wt 165.4 lb

## 2019-09-05 DIAGNOSIS — E213 Hyperparathyroidism, unspecified: Secondary | ICD-10-CM | POA: Diagnosis not present

## 2019-09-05 DIAGNOSIS — Z01812 Encounter for preprocedural laboratory examination: Secondary | ICD-10-CM | POA: Diagnosis not present

## 2019-09-05 DIAGNOSIS — D61818 Other pancytopenia: Secondary | ICD-10-CM | POA: Diagnosis not present

## 2019-09-05 DIAGNOSIS — D123 Benign neoplasm of transverse colon: Secondary | ICD-10-CM | POA: Diagnosis not present

## 2019-09-05 DIAGNOSIS — C210 Malignant neoplasm of anus, unspecified: Secondary | ICD-10-CM | POA: Diagnosis not present

## 2019-09-05 DIAGNOSIS — R69 Illness, unspecified: Secondary | ICD-10-CM | POA: Diagnosis not present

## 2019-09-05 DIAGNOSIS — Z20822 Contact with and (suspected) exposure to covid-19: Secondary | ICD-10-CM | POA: Insufficient documentation

## 2019-09-05 DIAGNOSIS — Z5111 Encounter for antineoplastic chemotherapy: Secondary | ICD-10-CM | POA: Diagnosis not present

## 2019-09-05 DIAGNOSIS — R197 Diarrhea, unspecified: Secondary | ICD-10-CM | POA: Diagnosis not present

## 2019-09-05 DIAGNOSIS — K5730 Diverticulosis of large intestine without perforation or abscess without bleeding: Secondary | ICD-10-CM | POA: Diagnosis not present

## 2019-09-05 DIAGNOSIS — J984 Other disorders of lung: Secondary | ICD-10-CM | POA: Diagnosis not present

## 2019-09-05 DIAGNOSIS — I10 Essential (primary) hypertension: Secondary | ICD-10-CM | POA: Diagnosis not present

## 2019-09-05 DIAGNOSIS — C211 Malignant neoplasm of anal canal: Secondary | ICD-10-CM | POA: Diagnosis not present

## 2019-09-05 LAB — CBC WITH DIFFERENTIAL (CANCER CENTER ONLY)
Abs Immature Granulocytes: 0.01 10*3/uL (ref 0.00–0.07)
Basophils Absolute: 0 10*3/uL (ref 0.0–0.1)
Basophils Relative: 0 %
Eosinophils Absolute: 0 10*3/uL (ref 0.0–0.5)
Eosinophils Relative: 2 %
HCT: 31.2 % — ABNORMAL LOW (ref 36.0–46.0)
Hemoglobin: 10.1 g/dL — ABNORMAL LOW (ref 12.0–15.0)
Immature Granulocytes: 0 %
Lymphocytes Relative: 4 %
Lymphs Abs: 0.1 10*3/uL — ABNORMAL LOW (ref 0.7–4.0)
MCH: 29.8 pg (ref 26.0–34.0)
MCHC: 32.4 g/dL (ref 30.0–36.0)
MCV: 92 fL (ref 80.0–100.0)
Monocytes Absolute: 0.2 10*3/uL (ref 0.1–1.0)
Monocytes Relative: 9 %
Neutro Abs: 2.2 10*3/uL (ref 1.7–7.7)
Neutrophils Relative %: 85 %
Platelet Count: 71 10*3/uL — ABNORMAL LOW (ref 150–400)
RBC: 3.39 MIL/uL — ABNORMAL LOW (ref 3.87–5.11)
RDW: 13.4 % (ref 11.5–15.5)
WBC Count: 2.6 10*3/uL — ABNORMAL LOW (ref 4.0–10.5)
nRBC: 0 % (ref 0.0–0.2)

## 2019-09-05 LAB — CMP (CANCER CENTER ONLY)
ALT: 12 U/L (ref 0–44)
AST: 21 U/L (ref 15–41)
Albumin: 3.5 g/dL (ref 3.5–5.0)
Alkaline Phosphatase: 79 U/L (ref 38–126)
Anion gap: 10 (ref 5–15)
BUN: 12 mg/dL (ref 8–23)
CO2: 24 mmol/L (ref 22–32)
Calcium: 9.2 mg/dL (ref 8.9–10.3)
Chloride: 107 mmol/L (ref 98–111)
Creatinine: 0.87 mg/dL (ref 0.44–1.00)
GFR, Est AFR Am: >60 mL/min (ref >60)
GFR, Estimated: >60 mL/min (ref >60)
Glucose, Bld: 100 mg/dL — ABNORMAL HIGH (ref 70–99)
Potassium: 3.4 mmol/L — ABNORMAL LOW (ref 3.5–5.1)
Sodium: 141 mmol/L (ref 135–145)
Total Bilirubin: 0.4 mg/dL (ref 0.3–1.2)
Total Protein: 8.2 g/dL — ABNORMAL HIGH (ref 6.5–8.1)

## 2019-09-05 LAB — SARS CORONAVIRUS 2 (TAT 6-24 HRS): SARS Coronavirus 2: NEGATIVE

## 2019-09-05 NOTE — H&P (View-Only) (Signed)
[name]   [phone]) [phone] [phone]) [phone]   Clinic Follow up Note   Patient Care Team: Seward Carol, MD as PCP - General (Internal Medicine) Carlyle Basques, MD as PCP - Infectious Diseases (Infectious Diseases) Binnie Rail, DC as Referring Physician (Chiropractic Medicine) Jonnie Finner, RN as Oncology Nurse Navigator 09/05/2019  CHIEF COMPLAINT: F/u anal cancer   SUMMARY OF ONCOLOGIC HISTORY: Oncology History Overview Note  Cancer Staging Anal cancer ([place]) Staging form: Anus, AJCC 8th Edition - Clinical: Stage IV (cT2, cN0, cM1) - Signed by Truitt Merle, MD on 08/12/2019    Anal cancer ([place])  07/19/2019 Procedure   Colonoscopy by Dr Havery Moros 07/19/19  IMPRESSION - Ulcer noted at the anal canal in posterior midline canal that extends into the distal rectum on digital rectal exam, nodular and somewhat hard to palpation. Endoscopic images show nodular tissue in the area, concerning for malignant ulcer. - The examined portion of the ileum was normal. - One 3 mm polyp at the hepatic flexure, removed with a cold snare. Resected and retrieved. - One 5 mm polyp in the transverse colon, removed with a cold snare. Resected and retrieved. - Diverticulosis in the ascending colon. - The examination was otherwise normal.   07/19/2019 Initial Biopsy   Diagnosis 07/19/19 1. Transverse Colon Polyp, hepatic flexure (2) - TUBULAR ADENOMA (1 OF 3 FRAGMENTS) - BENIGN COLONIC MUCOSA (2 OF 3 FRAGMENTS) - NO HIGH GRADE DYSPLASIA OR MALIGNANCY IDENTIFIED 2. Rectum, biopsy, distal rectal anal canal - SQUAMOUS CELL CARCINOMA - SEE COMMENT   07/26/2019 Initial Diagnosis   Anal cancer ([place])   07/26/2019 Cancer Staging   Staging form: Anus, AJCC 8th Edition - Clinical: Stage IV (cT2, cN1c, cM1) - Signed by Truitt Merle, MD on 08/12/2019   08/04/2019 PET scan   IMPRESSION: Hypermetabolic anal soft tissue mass, consistent with known primary anal carcinoma.   Sub-cm  hypermetabolic lymph nodes in posterior perirectal space, right inguinal region, right iliac chain, and gastrohepatic ligament, suspicious for metastatic disease.   Small hypermetabolic left axillary lymph nodes. This would be unusual location for metastatic anal carcinoma. Recommend clinical correlation for possibility the patient has had recent COVID vaccination in the left arm, which could explain this finding.   Multifocal airspace disease in both lower lungs with marked hypermetabolic activity. This favors infectious or inflammatory etiology, and is not typical for pulmonary metastases. Short-term follow-up by chest CT is recommended.   08/15/2019 -  Radiation Therapy   Concurrent ChemoRT with Dr. Lisbeth Renshaw starting 08/15/19   08/15/2019 -  Chemotherapy   Concurrent ChemoRT with Mitomycin and 5FU on week 1 and 5 starting 08/15/19      CURRENT THERAPY: Concurrent chemoRT with mitomycin and 5FU on week 1 and 5 starting 08/15/19  INTERVAL HISTORY: [name] returns for f/u as scheduled. She is day 21 s/p mitomycin and 5FU. Her ANC was low and RT was held on 4/15 and 4/16. She has soft bowel movement every 1-2 days, up to 2 per day. Takes anti-emetics rarely for mild intermittent nausea without vomiting. She is not eating as much but drinking. Denies mucositis. She is tired but functional and active at home. Rectal pain is 7/10 currently, up to 8/10 with BM. She has rectal discharge, no bleeding. Skin is irritated. Takes 2 oxycodone at night. Denies fever, chills, cough, chest pain, dyspnea, neuropathy.    MEDICAL HISTORY:  Past Medical History:  Diagnosis Date  . Allergic rhinitis   . Biliary colic   .  Gall bladder disease \  . Hepatitis C   . Herpes   . HIV infection ([place])   . Hyperparathyroidism ([place])   . Hypertension   . Insomnia   . Osteoporosis   . Pneumonia    2010    SURGICAL HISTORY: Past Surgical History:  Procedure Laterality Date  . CHOLECYSTECTOMY N/A 01/09/2015    Procedure: LAPAROSCOPIC CHOLECYSTECTOMY;  Surgeon: [name], MD;  Location: [facility];  Service: General;  Laterality: N/A;  . ECTOPIC PREGNANCY SURGERY    . IR FLUORO GUIDE CV LINE LEFT  08/15/2019  . ORIF TIBIA PLATEAU Right 02/18/2017   Procedure: OPEN REDUCTION INTERNAL FIXATION (ORIF) RIGHT TIBIAL PLATEAU;  Surgeon: Leandrew Koyanagi, MD;  Location: [facility];  Service: Orthopedics;  Laterality: Right;  . SHOULDER SURGERY Right 12/2014    I have reviewed the social history and family history with the patient and they are unchanged from previous note.  ALLERGIES:  is allergic to dapsone; retrovir [zidovudine]; and sulfamethoxazole-trimethoprim.  MEDICATIONS:  Current Outpatient Medications  Medication Sig Dispense Refill  . acetaminophen (TYLENOL) 500 MG tablet Take 1,000 mg by mouth every 6 (six) hours as needed for moderate pain or headache.    Marland Kitchen amLODipine (NORVASC) 10 MG tablet Take 1 tablet (10 mg total) by mouth daily. 90 tablet 3  . HYDROcodone-acetaminophen (NORCO) 5-325 MG tablet Take 1-2 tablets by mouth every 6 (six) hours as needed for moderate pain. 60 tablet 0  . ondansetron (ZOFRAN) 8 MG tablet Take 1 tablet (8 mg total) by mouth 2 (two) times daily as needed (Nausea or vomiting). 30 tablet 1  . oxyCODONE (OXY IR/ROXICODONE) 5 MG immediate release tablet Take 1-2 tablets (5-10 mg total) by mouth every 6 (six) hours as needed for severe pain. 60 tablet 0  . prochlorperazine (COMPAZINE) 10 MG tablet Take 1 tablet (10 mg total) by mouth every 6 (six) hours as needed (Nausea or vomiting). 30 tablet 1  . TRIUMEQ 600-50-300 MG tablet TAKE 1 TABLET BY MOUTH DAILY (Patient taking differently: Take 1 tablet by mouth daily. ) 30 tablet 3  . zolpidem (AMBIEN) 10 MG tablet Take 1 tablet (10 mg total) by mouth at bedtime as needed. (Patient taking differently: Take 10 mg by mouth at bedtime as needed for sleep. ) 30 tablet 1  . AMBULATORY NON FORMULARY MEDICATION Nitroglycerine ointment 0.125 %    Apply a pea sized amount internally four times daily. (Patient not taking: Reported on 08/25/2019) 30 g 1  . fluconazole (DIFLUCAN) 150 MG tablet Take 1 tablet (150 mg total) by mouth daily. 2 tablet 0  . ibuprofen (ADVIL) 800 MG tablet TAKE 1 TABLET BY MOUTH EVERY 8 HOURS AS NEEDED (Patient not taking: Reported on 08/25/2019) 90 tablet 0   No current facility-administered medications for this visit.    PHYSICAL EXAMINATION: ECOG PERFORMANCE STATUS: 1 - Symptomatic but completely ambulatory  Vitals:   09/05/19 1604  BP: (!) 139/92  Pulse: 100  Resp: 18  Temp: 98.2 F (36.8 C)  SpO2: 100%   Filed Weights   09/05/19 1604  Weight: 165 lb 6.4 oz (75 kg)    GENERAL:alert, no distress and comfortable SKIN: no rash  EYES: sclera clear OROPHARYNX: no thrush or ulcers LUNGS: normal breathing effort HEART:  no lower extremity edema NEURO: alert & oriented x 3 with fluent speech, normal gait RECTAL: patient declined   LABORATORY DATA:  I have reviewed the data as listed CBC Latest Ref Rng & Units 09/05/2019 09/02/2019  09/01/2019  WBC 4.0 - 10.5 K/uL 2.6(L) 0.9(LL) 0.7(LL)  Hemoglobin 12.0 - 15.0 g/dL 10.1(L) 10.4(L) 10.2(L)  Hematocrit 36.0 - 46.0 % 31.2(L) 31.6(L) 31.3(L)  Platelets 150 - 400 K/uL 71(L) 51(L) 44(L)     CMP Latest Ref Rng & Units 09/05/2019 08/31/2019 08/29/2019  Glucose 70 - 99 mg/dL 100(H) 98 119(H)  BUN 8 - 23 mg/dL 12 17 14   Creatinine 0.44 - 1.00 mg/dL 0.87 0.87 0.83  Sodium 135 - 145 mmol/L 141 139 141  Potassium 3.5 - 5.1 mmol/L 3.4(L) 3.6 3.5  Chloride 98 - 111 mmol/L 107 108 107  CO2 22 - 32 mmol/L 24 25 26   Calcium 8.9 - 10.3 mg/dL 9.2 9.2 9.6  Total Protein 6.5 - 8.1 g/dL 8.2(H) 7.9 8.2(H)  Total Bilirubin 0.3 - 1.2 mg/dL 0.4 0.5 0.4  Alkaline Phos 38 - 126 U/L 79 77 83  AST 15 - 41 U/L 21 19 27   ALT 0 - 44 U/L 12 13 16       RADIOGRAPHIC STUDIES: I have personally reviewed the radiological images as listed and agreed with the findings in the  report. No results found.   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 Her biopsy results show Squamous Cell Carcinoma.This is consistent with anal cancer. She has HIV, which is a high risk for anal cancer. -PET from 08/04/19 showedhypermetabolicanal mass, hypermetabolicLNsin posterior perirectal space, right inguinal region, right iliac chain and gastrohepatic ligament suspicious for metastasis. Scan also showed hypermetabolic left axillary LNs, which is unusual metastatic pattern. Shedenies recent injury or injections in the left arm.A biopsy has been recommended torule outdistant metastasis.  -Althoughmetastatic disease is not ruled out at this point,Dr. Lisbeth Renshaw is able to cover thegastrohepatic lymph node in the radiation field. -She began concurrent chemoRT with mitomycin and 5FU (week 1 and week 5) on 08/15/19, RT held on days 17 and 18 for neutropenia ANC 0.3 which recovered and she resumed RT on 09/02/19 .  -Plan to repeat CT chest to evaluate her left axillary lymph node after she completes chemoradiation. -She plans to have herEUS biopsy of the gastrohepatic ligament nodewith Dr. Ardis Hughs on 09/08/19 pending adequate plt count   2. Pancytopenia, secondary to chemoRT -Nadir plt 44K on day 15 and ANC 0.3 on day 17 -anemia remained mild and stable in 10 range  3. Hep C, HIV -She underwentsuccessfulHep C treatment 2 years ago [id number]) -Her HIV was contracted through sex. Her HIV is well controlled. She will continue to f/u ID Dr Graylon Good.Last CD4 count was 386 on 02/07/2019. -Compliant with Triumeq  4. Comorbidities: HTN, Hyperparathyroidism, Osteoporosis -Continue medications and f/u with  PCP.  -BP 139/92 today, she did not take amlodipine today    5. Social Support  -She is divorced, lives alone and has 2 adult children who live in [place]  -She is getting bachelors degree in [place] through [employer] schooling, she plans to resume classes in 10/2019, she prefers not to postpone if possible.   -She is currently working at [employer] as [employer] and works 8-9 hours every day. She notes her work is flexible to take time off if needed.  Disposition:  Ms. Goering appears stable. She is s/p day 21 mitomycin and 5FU, she resumed RT after holding twice  last week for neutropenia. She is tolerating treatment well with mild fatigue, nausea without vomiting, and soft stool. I recommend to take imodium PRN, remain hydrated, and continue anti-emetics which are helping. No weight loss. She declined to let me check her skin today because she did not feel clean, she agrees to let RT provider examine her tomorrow.   Labs reviewed. CBC shows neutropenia resolved, mild anemia is stable, thrombocytopenia is improving to 71K today. We will repeat labs 4/21 before planned EUS biopsy on 4/22. CMP shows K 3.4 related to GI output and low po intake. I recommend to increase K in her diet. If not able to correct with nutrition I will add K supplement.   She will continue RT this week, and plan for PICC line, lab, follow up, and day 28 chemo starting 09/12/19 if counts recover.    No problem-specific Assessment & Plan notes found for this encounter.   Orders Placed This Encounter  Procedures  . IR Fluoro Guide CV Line Right    Indicate type of CVC ordering    Standing Status:   Future    Standing Expiration Date:   11/04/2020    Order Specific Question:   Reason for exam:    Answer:   PICC line for chemo    Order Specific Question:   Preferred Imaging Location?    Answer:   [facility]   All questions were answered. The patient knows to call the clinic with any problems,  questions or concerns. No barriers to learning was detected.     Alla Feeling, NP 09/05/19

## 2019-09-05 NOTE — Telephone Encounter (Signed)
TC to pt to check on her since she missed both of her appointments today (12:45 LAB, 1:15 DOCTOR visit). Unable to reach patient. Left voicemail for her to call back [place].

## 2019-09-05 NOTE — Progress Notes (Signed)
[place]   [phone] [phone]   Clinic Follow up Note   Patient Care Team: Seward Carol, MD as PCP - General (Internal Medicine) Carlyle Basques, MD as PCP - Infectious Diseases (Infectious Diseases) Binnie Rail, DC as Referring Physician (Chiropractic Medicine) Jonnie Finner, RN as Oncology Nurse Navigator 09/05/2019  CHIEF COMPLAINT: F/u anal cancer   SUMMARY OF ONCOLOGIC HISTORY: Oncology History Overview Note  Cancer Staging Anal cancer ([place]) Staging form: Anus, AJCC 8th Edition - Clinical: Stage IV (cT2, cN0, cM1) - Signed by Truitt Merle, MD on 08/12/2019    Anal cancer ([place])  07/19/2019 Procedure   Colonoscopy by Dr Havery Moros 07/19/19  IMPRESSION - Ulcer noted at the anal canal in posterior midline canal that extends into the distal rectum on digital rectal exam, nodular and somewhat hard to palpation. Endoscopic images show nodular tissue in the area, concerning for malignant ulcer. - The examined portion of the ileum was normal. - One 3 mm polyp at the hepatic flexure, removed with a cold snare. Resected and retrieved. - One 5 mm polyp in the transverse colon, removed with a cold snare. Resected and retrieved. - Diverticulosis in the ascending colon. - The examination was otherwise normal.   07/19/2019 Initial Biopsy   Diagnosis 07/19/19 1. Transverse Colon Polyp, hepatic flexure (2) - TUBULAR ADENOMA (1 OF 3 FRAGMENTS) - BENIGN COLONIC MUCOSA (2 OF 3 FRAGMENTS) - NO HIGH GRADE DYSPLASIA OR MALIGNANCY IDENTIFIED 2. Rectum, biopsy, distal rectal anal canal - SQUAMOUS CELL CARCINOMA - SEE COMMENT   07/26/2019 Initial Diagnosis   Anal cancer ([place])   07/26/2019 Cancer Staging   Staging form: Anus, AJCC 8th Edition - Clinical: Stage IV (cT2, cN1c, cM1) - Signed by Truitt Merle, MD on 08/12/2019   08/04/2019 PET scan   IMPRESSION: Hypermetabolic anal soft tissue mass, consistent with known primary anal carcinoma.   Sub-cm  hypermetabolic lymph nodes in posterior perirectal space, right inguinal region, right iliac chain, and gastrohepatic ligament, suspicious for metastatic disease.   Small hypermetabolic left axillary lymph nodes. This would be unusual location for metastatic anal carcinoma. Recommend clinical correlation for possibility the patient has had recent COVID vaccination in the left arm, which could explain this finding.   Multifocal airspace disease in both lower lungs with marked hypermetabolic activity. This favors infectious or inflammatory etiology, and is not typical for pulmonary metastases. Short-term follow-up by chest CT is recommended.   08/15/2019 -  Radiation Therapy   Concurrent ChemoRT with Dr. Lisbeth Renshaw starting 08/15/19   08/15/2019 -  Chemotherapy   Concurrent ChemoRT with Mitomycin and 5FU on week 1 and 5 starting 08/15/19      CURRENT THERAPY: Concurrent chemoRT with mitomycin and 5FU on week 1 and 5 starting 08/15/19  INTERVAL HISTORY: Vanessa Beard returns for f/u as scheduled. She is day 21 s/p mitomycin and 5FU. Her ANC was low and RT was held on 4/15 and 4/16. She has soft bowel movement every 1-2 days, up to 2 per day. Takes anti-emetics rarely for mild intermittent nausea without vomiting. She is not eating as much but drinking. Denies mucositis. She is tired but functional and active at home. Rectal pain is 7/10 currently, up to 8/10 with BM. She has rectal discharge, no bleeding. Skin is irritated. Takes 2 oxycodone at night. Denies fever, chills, cough, chest pain, dyspnea, neuropathy.    MEDICAL HISTORY:  Past Medical History:  Diagnosis Date  . Allergic rhinitis   . Biliary colic   .  Gall bladder disease \  . Hepatitis C   . Herpes   . HIV infection ([place])   . Hyperparathyroidism ([place])   . Hypertension   . Insomnia   . Osteoporosis   . Pneumonia    2010    SURGICAL HISTORY: Past Surgical History:  Procedure Laterality Date  . CHOLECYSTECTOMY N/A 01/09/2015    Procedure: LAPAROSCOPIC CHOLECYSTECTOMY;  Surgeon: [name], MD;  Location: [facility];  Service: General;  Laterality: N/A;  . ECTOPIC PREGNANCY SURGERY    . IR FLUORO GUIDE CV LINE LEFT  08/15/2019  . ORIF TIBIA PLATEAU Right 02/18/2017   Procedure: OPEN REDUCTION INTERNAL FIXATION (ORIF) RIGHT TIBIAL PLATEAU;  Surgeon: Leandrew Koyanagi, MD;  Location: [facility];  Service: Orthopedics;  Laterality: Right;  . SHOULDER SURGERY Right 12/2014    I have reviewed the social history and family history with the patient and they are unchanged from previous note.  ALLERGIES:  is allergic to dapsone; retrovir [zidovudine]; and sulfamethoxazole-trimethoprim.  MEDICATIONS:  Current Outpatient Medications  Medication Sig Dispense Refill  . acetaminophen (TYLENOL) 500 MG tablet Take 1,000 mg by mouth every 6 (six) hours as needed for moderate pain or headache.    Marland Kitchen amLODipine (NORVASC) 10 MG tablet Take 1 tablet (10 mg total) by mouth daily. 90 tablet 3  . HYDROcodone-acetaminophen (NORCO) 5-325 MG tablet Take 1-2 tablets by mouth every 6 (six) hours as needed for moderate pain. 60 tablet 0  . ondansetron (ZOFRAN) 8 MG tablet Take 1 tablet (8 mg total) by mouth 2 (two) times daily as needed (Nausea or vomiting). 30 tablet 1  . oxyCODONE (OXY IR/ROXICODONE) 5 MG immediate release tablet Take 1-2 tablets (5-10 mg total) by mouth every 6 (six) hours as needed for severe pain. 60 tablet 0  . prochlorperazine (COMPAZINE) 10 MG tablet Take 1 tablet (10 mg total) by mouth every 6 (six) hours as needed (Nausea or vomiting). 30 tablet 1  . TRIUMEQ 600-50-300 MG tablet TAKE 1 TABLET BY MOUTH DAILY (Patient taking differently: Take 1 tablet by mouth daily. ) 30 tablet 3  . zolpidem (AMBIEN) 10 MG tablet Take 1 tablet (10 mg total) by mouth at bedtime as needed. (Patient taking differently: Take 10 mg by mouth at bedtime as needed for sleep. ) 30 tablet 1  . AMBULATORY NON FORMULARY MEDICATION Nitroglycerine ointment 0.125 %   Apply a pea sized amount internally four times daily. (Patient not taking: Reported on 08/25/2019) 30 g 1  . fluconazole (DIFLUCAN) 150 MG tablet Take 1 tablet (150 mg total) by mouth daily. 2 tablet 0  . ibuprofen (ADVIL) 800 MG tablet TAKE 1 TABLET BY MOUTH EVERY 8 HOURS AS NEEDED (Patient not taking: Reported on 08/25/2019) 90 tablet 0   No current facility-administered medications for this visit.    PHYSICAL EXAMINATION: ECOG PERFORMANCE STATUS: 1 - Symptomatic but completely ambulatory  Vitals:   09/05/19 1604  BP: (!) 139/92  Pulse: 100  Resp: 18  Temp: 98.2 F (36.8 C)  SpO2: 100%   Filed Weights   09/05/19 1604  Weight: 165 lb 6.4 oz (75 kg)    GENERAL:alert, no distress and comfortable SKIN: no rash  EYES: sclera clear OROPHARYNX: no thrush or ulcers LUNGS: normal breathing effort HEART:  no lower extremity edema NEURO: alert & oriented x 3 with fluent speech, normal gait RECTAL: patient declined   LABORATORY DATA:  I have reviewed the data as listed CBC Latest Ref Rng & Units 09/05/2019 09/02/2019 09/01/2019  WBC 4.0 - 10.5 K/uL 2.6(L) 0.9(LL) 0.7(LL)  Hemoglobin 12.0 - 15.0 g/dL 10.1(L) 10.4(L) 10.2(L)  Hematocrit 36.0 - 46.0 % 31.2(L) 31.6(L) 31.3(L)  Platelets 150 - 400 K/uL 71(L) 51(L) 44(L)     CMP Latest Ref Rng & Units 09/05/2019 08/31/2019 08/29/2019  Glucose 70 - 99 mg/dL 100(H) 98 119(H)  BUN 8 - 23 mg/dL 12 17 14   Creatinine 0.44 - 1.00 mg/dL 0.87 0.87 0.83  Sodium 135 - 145 mmol/L 141 139 141  Potassium 3.5 - 5.1 mmol/L 3.4(L) 3.6 3.5  Chloride 98 - 111 mmol/L 107 108 107  CO2 22 - 32 mmol/L 24 25 26   Calcium 8.9 - 10.3 mg/dL 9.2 9.2 9.6  Total Protein 6.5 - 8.1 g/dL 8.2(H) 7.9 8.2(H)  Total Bilirubin 0.3 - 1.2 mg/dL 0.4 0.5 0.4  Alkaline Phos 38 - 126 U/L 79 77 83  AST 15 - 41 U/L 21 19 27   ALT 0 - 44 U/L 12 13 16       RADIOGRAPHIC STUDIES: I have personally reviewed the radiological images as listed and agreed with the findings in the  report. No results found.   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 Her biopsy results show Squamous Cell Carcinoma.This is consistent with anal cancer. She has HIV, which is a high risk for anal cancer. -PET from 08/04/19 showedhypermetabolicanal mass, hypermetabolicLNsin posterior perirectal space, right inguinal region, right iliac chain and gastrohepatic ligament suspicious for metastasis. Scan also showed hypermetabolic left axillary LNs, which is unusual metastatic pattern. Shedenies recent injury or injections in the left arm.A biopsy has been recommended torule outdistant metastasis.  -Althoughmetastatic disease is not ruled out at this point,Dr. Lisbeth Renshaw is able to cover thegastrohepatic lymph node in the radiation field. -She began concurrent chemoRT with mitomycin and 5FU (week 1 and week 5) on 08/15/19, RT held on days 17 and 18 for neutropenia ANC 0.3 which recovered and she resumed RT on 09/02/19 .  -Plan to repeat CT chest to evaluate her left axillary lymph node after she completes chemoradiation. -She plans to have herEUS biopsy of the gastrohepatic ligament nodewith Dr. Ardis Hughs on 09/08/19 pending adequate plt count   2. Pancytopenia, secondary to chemoRT -Nadir plt 44K on day 15 and ANC 0.3 on day 17 -anemia remained mild and stable in 10 range  3. Hep C, HIV -She underwentsuccessfulHep C treatment 2 years ago [id number] -Her HIV was contracted through sex. Her HIV is well controlled. She will continue to f/u ID Dr Graylon Good.Last CD4 count was 386 on 02/07/2019. -Compliant with Triumeq  4. Comorbidities: HTN, Hyperparathyroidism, Osteoporosis -Continue medications and f/u with  PCP.  -BP 139/92 today, she did not take amlodipine today    5. Social Support  -She is divorced, lives alone and has 2 adult children who live in [place]  -She is getting bachelors degree in [employer] through Online schooling, she plans to resume classes in 10/2019, she prefers not to postpone if possible.   -She is currently working at [employer] as [employer] and works 8-9 hours every day. She notes her work is flexible to take time off if needed.  Disposition:  Ms. Derocher appears stable. She is s/p day 21 mitomycin and 5FU, she resumed RT after holding twice last week  for neutropenia. She is tolerating treatment well with mild fatigue, nausea without vomiting, and soft stool. I recommend to take imodium PRN, remain hydrated, and continue anti-emetics which are helping. No weight loss. She declined to let me check her skin today because she did not feel clean, she agrees to let RT provider examine her tomorrow.   Labs reviewed. CBC shows neutropenia resolved, mild anemia is stable, thrombocytopenia is improving to 71K today. We will repeat labs 4/21 before planned EUS biopsy on 4/22. CMP shows K 3.4 related to GI output and low po intake. I recommend to increase K in her diet. If not able to correct with nutrition I will add K supplement.   She will continue RT this week, and plan for PICC line, lab, follow up, and day 28 chemo starting 09/12/19 if counts recover.    No problem-specific Assessment & Plan notes found for this encounter.   Orders Placed This Encounter  Procedures  . IR Fluoro Guide CV Line Right    Indicate type of CVC ordering    Standing Status:   Future    Standing Expiration Date:   11/04/2020    Order Specific Question:   Reason for exam:    Answer:   PICC line for chemo    Order Specific Question:   Preferred Imaging Location?    Answer:   [facility]   All questions were answered. The patient knows to call the clinic with any problems,  questions or concerns. No barriers to learning was detected.     Alla Feeling, NP 09/05/19

## 2019-09-06 ENCOUNTER — Ambulatory Visit
Admission: RE | Admit: 2019-09-06 | Discharge: 2019-09-06 | Disposition: A | Discharge status: Home / Self Care | Payer: Medicare HMO | Source: Ambulatory Visit | Attending: Radiation Oncology | Admitting: Radiation Oncology

## 2019-09-06 ENCOUNTER — Telehealth: Payer: Self-pay

## 2019-09-06 ENCOUNTER — Other Ambulatory Visit: Payer: Self-pay

## 2019-09-06 DIAGNOSIS — Z510 Encounter for antineoplastic radiation therapy: Secondary | ICD-10-CM | POA: Diagnosis not present

## 2019-09-06 DIAGNOSIS — C20 Malignant neoplasm of rectum: Secondary | ICD-10-CM | POA: Diagnosis not present

## 2019-09-06 DIAGNOSIS — C210 Malignant neoplasm of anus, unspecified: Secondary | ICD-10-CM | POA: Diagnosis not present

## 2019-09-06 NOTE — Telephone Encounter (Signed)
Spoke with patient and let her know that her PICC placement is scheduled for 8am with interventional radiology, she will also get her labs drawn with them. After that she will see Dr Burr Medico @8 :40a, INF at 9:30a. Patient verbalized understanding. No further problems or concerns at this time.

## 2019-09-06 NOTE — Progress Notes (Signed)
Pharmacist Chemotherapy Monitoring - Follow Up Assessment    I verify that I have reviewed each item in the below checklist:  . Regimen for the patient is scheduled for the appropriate day and plan matches scheduled date. Marland Kitchen Appropriate non-routine labs are ordered dependent on drug ordered. . If applicable, additional medications reviewed and ordered per protocol based on lifetime cumulative doses and/or treatment regimen.   Plan for follow-up and/or issues identified: No . I-vent associated with next due treatment: No . MD and/or nursing notified: No  Philomena Course 09/06/2019 3:19 PM

## 2019-09-06 NOTE — Telephone Encounter (Signed)
Per Cira Rue NP called [phone]) to schedule PICC line before lab/follow up/chemo on 4/26. PICC line placement is scheduled for 8am on 4/26. Labs will be drawn in IR. Attempted to call patient to let her know appointment time but was unable to reach her. Left voicemail to call back [place].  Will try call again later

## 2019-09-07 ENCOUNTER — Other Ambulatory Visit: Payer: Self-pay

## 2019-09-07 ENCOUNTER — Ambulatory Visit
Admission: RE | Admit: 2019-09-07 | Discharge: 2019-09-07 | Disposition: A | Discharge status: Home / Self Care | Payer: Medicare HMO | Source: Ambulatory Visit | Attending: Radiation Oncology | Admitting: Radiation Oncology

## 2019-09-07 ENCOUNTER — Inpatient Hospital Stay: Payer: Medicare HMO

## 2019-09-07 DIAGNOSIS — I10 Essential (primary) hypertension: Secondary | ICD-10-CM | POA: Diagnosis not present

## 2019-09-07 DIAGNOSIS — R197 Diarrhea, unspecified: Secondary | ICD-10-CM | POA: Diagnosis not present

## 2019-09-07 DIAGNOSIS — E213 Hyperparathyroidism, unspecified: Secondary | ICD-10-CM | POA: Diagnosis not present

## 2019-09-07 DIAGNOSIS — D61818 Other pancytopenia: Secondary | ICD-10-CM | POA: Diagnosis not present

## 2019-09-07 DIAGNOSIS — Z5111 Encounter for antineoplastic chemotherapy: Secondary | ICD-10-CM | POA: Diagnosis not present

## 2019-09-07 DIAGNOSIS — R69 Illness, unspecified: Secondary | ICD-10-CM | POA: Diagnosis not present

## 2019-09-07 DIAGNOSIS — C20 Malignant neoplasm of rectum: Secondary | ICD-10-CM | POA: Diagnosis not present

## 2019-09-07 DIAGNOSIS — K5730 Diverticulosis of large intestine without perforation or abscess without bleeding: Secondary | ICD-10-CM | POA: Diagnosis not present

## 2019-09-07 DIAGNOSIS — J984 Other disorders of lung: Secondary | ICD-10-CM | POA: Diagnosis not present

## 2019-09-07 DIAGNOSIS — D123 Benign neoplasm of transverse colon: Secondary | ICD-10-CM | POA: Diagnosis not present

## 2019-09-07 DIAGNOSIS — C210 Malignant neoplasm of anus, unspecified: Secondary | ICD-10-CM

## 2019-09-07 DIAGNOSIS — C211 Malignant neoplasm of anal canal: Secondary | ICD-10-CM | POA: Diagnosis not present

## 2019-09-07 DIAGNOSIS — Z510 Encounter for antineoplastic radiation therapy: Secondary | ICD-10-CM | POA: Diagnosis not present

## 2019-09-07 LAB — CBC WITH DIFFERENTIAL (CANCER CENTER ONLY)
Abs Immature Granulocytes: 0.01 10*3/uL (ref 0.00–0.07)
Basophils Absolute: 0 10*3/uL (ref 0.0–0.1)
Basophils Relative: 0 %
Eosinophils Absolute: 0.1 10*3/uL (ref 0.0–0.5)
Eosinophils Relative: 2 %
HCT: 30.8 % — ABNORMAL LOW (ref 36.0–46.0)
Hemoglobin: 10.1 g/dL — ABNORMAL LOW (ref 12.0–15.0)
Immature Granulocytes: 0 %
Lymphocytes Relative: 2 %
Lymphs Abs: 0.1 10*3/uL — ABNORMAL LOW (ref 0.7–4.0)
MCH: 30.3 pg (ref 26.0–34.0)
MCHC: 32.8 g/dL (ref 30.0–36.0)
MCV: 92.5 fL (ref 80.0–100.0)
Monocytes Absolute: 0.2 10*3/uL (ref 0.1–1.0)
Monocytes Relative: 7 %
Neutro Abs: 2.6 10*3/uL (ref 1.7–7.7)
Neutrophils Relative %: 89 %
Platelet Count: 108 10*3/uL — ABNORMAL LOW (ref 150–400)
RBC: 3.33 MIL/uL — ABNORMAL LOW (ref 3.87–5.11)
RDW: 13.8 % (ref 11.5–15.5)
WBC Count: 2.9 10*3/uL — ABNORMAL LOW (ref 4.0–10.5)
nRBC: 0 % (ref 0.0–0.2)

## 2019-09-07 LAB — CMP (CANCER CENTER ONLY)
ALT: 8 U/L (ref 0–44)
AST: 18 U/L (ref 15–41)
Albumin: 3.2 g/dL — ABNORMAL LOW (ref 3.5–5.0)
Alkaline Phosphatase: 72 U/L (ref 38–126)
Anion gap: 8 (ref 5–15)
BUN: 13 mg/dL (ref 8–23)
CO2: 25 mmol/L (ref 22–32)
Calcium: 9.3 mg/dL (ref 8.9–10.3)
Chloride: 106 mmol/L (ref 98–111)
Creatinine: 0.91 mg/dL (ref 0.44–1.00)
GFR, Est AFR Am: >60 mL/min (ref >60)
GFR, Estimated: >60 mL/min (ref >60)
Glucose, Bld: 103 mg/dL — ABNORMAL HIGH (ref 70–99)
Potassium: 3.4 mmol/L — ABNORMAL LOW (ref 3.5–5.1)
Sodium: 139 mmol/L (ref 135–145)
Total Bilirubin: 0.4 mg/dL (ref 0.3–1.2)
Total Protein: 7.7 g/dL (ref 6.5–8.1)

## 2019-09-07 NOTE — Progress Notes (Signed)
[place]   Telephone:(336) [phone] Fax:(336) [phone]   Clinic Follow up Note   Patient Care Team: Vanessa Carol, MD as PCP - General (Internal Medicine) Vanessa Basques, MD as PCP - Infectious Diseases (Infectious Diseases) Vanessa Beard, DC as Referring Physician (Chiropractic Medicine) Vanessa Finner, RN as Oncology Nurse Navigator  Date of Service:  09/12/2019  CHIEF COMPLAINT: F/u ofanal cancer  SUMMARY OF ONCOLOGIC HISTORY: Oncology History Overview Note  Cancer Staging Anal cancer ([place]) Staging form: Anus, AJCC 8th Edition - Clinical: Stage IV (cT2, cN0, cM1) - Signed by Vanessa Merle, MD on 08/12/2019    Anal cancer ([place])  07/19/2019 Procedure   Colonoscopy by Dr Vanessa Beard 07/19/19  IMPRESSION - Ulcer noted at the anal canal in posterior midline canal that extends into the distal rectum on digital rectal exam, nodular and somewhat hard to palpation. Endoscopic images show nodular tissue in the area, concerning for malignant ulcer. - The examined portion of the ileum was normal. - One 3 mm polyp at the hepatic flexure, removed with a cold snare. Resected and retrieved. - One 5 mm polyp in the transverse colon, removed with a cold snare. Resected and retrieved. - Diverticulosis in the ascending colon. - The examination was otherwise normal.   07/19/2019 Initial Biopsy   Diagnosis 07/19/19 1. Transverse Colon Polyp, hepatic flexure (2) - TUBULAR ADENOMA (1 OF 3 FRAGMENTS) - BENIGN COLONIC MUCOSA (2 OF 3 FRAGMENTS) - NO HIGH GRADE DYSPLASIA OR MALIGNANCY IDENTIFIED 2. Rectum, biopsy, distal rectal anal canal - SQUAMOUS CELL CARCINOMA - SEE COMMENT   07/26/2019 Initial Diagnosis   Anal cancer ([place])   07/26/2019 Cancer Staging   Staging form: Anus, AJCC 8th Edition - Clinical: Stage IV (cT2, cN1c, cM1) - Signed by Vanessa Merle, MD on 08/12/2019   08/04/2019 PET scan   IMPRESSION: Hypermetabolic anal soft tissue mass, consistent with known primary anal  carcinoma.   Sub-cm hypermetabolic lymph nodes in posterior perirectal space, right inguinal region, right iliac chain, and gastrohepatic ligament, suspicious for metastatic disease.   Small hypermetabolic left axillary lymph nodes. This would be unusual location for metastatic anal carcinoma. Recommend clinical correlation for possibility the patient has had recent COVID vaccination in the left arm, which could explain this finding.   Multifocal airspace disease in both lower lungs with marked hypermetabolic activity. This favors infectious or inflammatory etiology, and is not typical for pulmonary metastases. Short-term follow-up by chest CT is recommended.   08/15/2019 -  Radiation Therapy   Concurrent ChemoRT with Dr. Lisbeth Beard starting 08/15/19   08/15/2019 - 09/12/2019 Chemotherapy   Concurrent ChemoRT with Mitomycin and 5FU on week 1 and 5 starting 08/15/19 with week 5 dose on 09/12/19.       CURRENT THERAPY:  Concurrent chemoRT with Mitomycin and 5FU on week 1 and 5 for 6 weeks starting 08/15/19.  INTERVAL HISTORY:  REMEDIOS Beard is here for a follow up of treatment. She presents to the clinic alone. She notes her is tolerating treatment with rectal/buttock burning. She notes it is manageable. She denies diarrhea or bleeding. She notes her energy level is manageable.     REVIEW OF SYSTEMS:   Constitutional: Denies fevers, chills or abnormal weight loss Eyes: Denies blurriness of vision Ears, nose, mouth, throat, and face: Denies mucositis or sore throat Respiratory: Denies cough, dyspnea or wheezes Cardiovascular: Denies palpitation, chest discomfort or lower extremity swelling Gastrointestinal:  Denies nausea, heartburn or change in bowel habits Skin: Denies abnormal skin rashes  Lymphatics: Denies new lymphadenopathy or easy bruising Neurological:Denies numbness, tingling or new weaknesses Behavioral/Psych: Mood is stable, no new changes  All other systems were reviewed  with the patient and are negative.  MEDICAL HISTORY:  Past Medical History:  Diagnosis Date  . Allergic rhinitis   . Biliary colic   . Gall bladder disease \  . Hepatitis C   . Herpes   . HIV infection ([place])   . Hyperparathyroidism ([place])   . Hypertension   . Insomnia   . Osteoporosis   . Pneumonia    2010    SURGICAL HISTORY: Past Surgical History:  Procedure Laterality Date  . CHOLECYSTECTOMY N/A 01/09/2015   Procedure: LAPAROSCOPIC CHOLECYSTECTOMY;  Surgeon: Vanessa Ok, MD;  Location: [facility];  Service: General;  Laterality: N/A;  . ECTOPIC PREGNANCY SURGERY    . ESOPHAGOGASTRODUODENOSCOPY (EGD) WITH PROPOFOL N/A 09/08/2019   Procedure: ESOPHAGOGASTRODUODENOSCOPY (EGD) WITH PROPOFOL;  Surgeon: Vanessa Banister, MD;  Location: [facility];  Service: Endoscopy;  Laterality: N/A;  . IR FLUORO GUIDE CV LINE LEFT  08/15/2019  . ORIF TIBIA PLATEAU Right 02/18/2017   Procedure: OPEN REDUCTION INTERNAL FIXATION (ORIF) RIGHT TIBIAL PLATEAU;  Surgeon: Vanessa Koyanagi, MD;  Location: [facility];  Service: Orthopedics;  Laterality: Right;  . SHOULDER SURGERY Right 12/2014  . UPPER ESOPHAGEAL ENDOSCOPIC ULTRASOUND (EUS) N/A 09/08/2019   Procedure: UPPER ESOPHAGEAL ENDOSCOPIC ULTRASOUND (EUS);  Surgeon: Vanessa Banister, MD;  Location: [facility];  Service: Endoscopy;  Laterality: N/A;    I have reviewed the social history and family history with the patient and they are unchanged from previous note.  ALLERGIES:  is allergic to dapsone; retrovir [zidovudine]; and sulfamethoxazole-trimethoprim.  MEDICATIONS:  Current Outpatient Medications  Medication Sig Dispense Refill  . acetaminophen (TYLENOL) 500 MG tablet Take 1,000 mg by mouth every 6 (six) hours as needed for moderate pain or headache.    . AMBULATORY NON FORMULARY MEDICATION Nitroglycerine ointment 0.125 %  Apply a pea sized amount internally four times daily. (Patient not taking: Reported on 08/25/2019) 30 g 1  . amLODipine (NORVASC)  10 MG tablet Take 1 tablet (10 mg total) by mouth daily. 90 tablet 3  . fluconazole (DIFLUCAN) 150 MG tablet Take 1 tablet (150 mg total) by mouth daily. 2 tablet 0  . HYDROcodone-acetaminophen (NORCO) 5-325 MG tablet Take 1-2 tablets by mouth every 6 (six) hours as needed for moderate pain. 60 tablet 0  . ibuprofen (ADVIL) 800 MG tablet TAKE 1 TABLET BY MOUTH EVERY 8 HOURS AS NEEDED (Patient not taking: Reported on 08/25/2019) 90 tablet 0  . ondansetron (ZOFRAN) 8 MG tablet Take 1 tablet (8 mg total) by mouth 2 (two) times daily as needed (Nausea or vomiting). 30 tablet 1  . oxyCODONE (OXY IR/ROXICODONE) 5 MG immediate release tablet Take 1-2 tablets (5-10 mg total) by mouth every 6 (six) hours as needed for severe pain. 60 tablet 0  . prochlorperazine (COMPAZINE) 10 MG tablet Take 1 tablet (10 mg total) by mouth every 6 (six) hours as needed (Nausea or vomiting). 30 tablet 1  . TRIUMEQ 600-50-300 MG tablet TAKE 1 TABLET BY MOUTH DAILY (Patient taking differently: Take 1 tablet by mouth daily. ) 30 tablet 3  . zolpidem (AMBIEN) 10 MG tablet Take 1 tablet (10 mg total) by mouth at bedtime as needed. (Patient taking differently: Take 10 mg by mouth at bedtime as needed for sleep. ) 30 tablet 1   No current facility-administered medications for this visit.  Facility-Administered Medications Ordered in Other Visits  Medication Dose Route Frequency Provider Last Rate Last Admin  . fluorouracil (ADRUCIL) 7,150 mg in sodium chloride 0.9 % 107 mL chemo infusion  1,000 mg/m2/day (Treatment Plan Recorded) Intravenous 4 days Vanessa Merle, MD   7,150 mg at 09/12/19 1100  . lidocaine (XYLOCAINE) 1 % (with pres) injection           . lidocaine (XYLOCAINE) 1 % (with pres) injection    PRN Corrie Mckusick, DO   5 mL at 09/12/19 0900    PHYSICAL EXAMINATION: ECOG PERFORMANCE STATUS: 1 - Symptomatic but completely ambulatory  There were no vitals filed for this visit. There were no vitals filed for this  visit.  Due to Declo we will limit examination to appearance. Patient had no complaints.  GENERAL:alert, no distress and comfortable SKIN: skin color normal, no rashes or significant lesions EYES: normal, Conjunctiva are pink and non-injected, sclera clear  NEURO: alert & oriented x 3 with fluent speech   LABORATORY DATA:  I have reviewed the data as listed CBC Latest Ref Rng & Units 09/12/2019 09/07/2019 09/05/2019  WBC 4.0 - 10.5 K/uL 2.2(L) 2.9(L) 2.6(L)  Hemoglobin 12.0 - 15.0 g/dL 10.4(L) 10.1(L) 10.1(L)  Hematocrit 36.0 - 46.0 % 31.3(L) 30.8(L) 31.2(L)  Platelets 150 - 400 K/uL 186 108(L) 71(L)     CMP Latest Ref Rng & Units 09/12/2019 09/07/2019 09/05/2019  Glucose 70 - 99 mg/dL 100(H) 103(H) 100(H)  BUN 8 - 23 mg/dL 12 13 12   Creatinine 0.44 - 1.00 mg/dL 0.77 0.91 0.87  Sodium 135 - 145 mmol/L 144 139 141  Potassium 3.5 - 5.1 mmol/L 3.9 3.4(L) 3.4(L)  Chloride 98 - 111 mmol/L 108 106 107  CO2 22 - 32 mmol/L 24 25 24   Calcium 8.9 - 10.3 mg/dL 9.1 9.3 9.2  Total Protein 6.5 - 8.1 g/dL 7.6 7.7 8.2(H)  Total Bilirubin 0.3 - 1.2 mg/dL 0.4 0.4 0.4  Alkaline Phos 38 - 126 U/L 58 72 79  AST 15 - 41 U/L 15 18 21   ALT 0 - 44 U/L 7 8 12       RADIOGRAPHIC STUDIES: I have personally reviewed the radiological images as listed and agreed with the findings in the report. IR PICC PLACEMENT RIGHT >5 YRS INC IMG GUIDE  Result Date: 09/12/2019 INDICATION: 69 year old female with a history of anal cancer referred for PICC EXAM: PICC LINE PLACEMENT WITH ULTRASOUND AND FLUOROSCOPIC GUIDANCE MEDICATIONS: None. ANESTHESIA/SEDATION: None FLUOROSCOPY TIME:  Fluoroscopy Time: 0 minutes 6 seconds (1 mGy). COMPLICATIONS: None PROCEDURE: Informed written consent was obtained from the patient after a thorough discussion of the procedural risks, benefits and alternatives. All questions were addressed. Maximal Sterile Barrier Technique was utilized including caps, mask, sterile gowns, sterile gloves,  sterile drape, hand hygiene and skin antiseptic. A timeout was performed prior to the initiation of the procedure. Patient was position in the supine position on the fluoroscopy table with the right arm abducted 90 degrees. Ultrasound survey of the upper extremity was performed with images stored and sent to PACs. The right brachial vein was selected for access. Once the patient was prepped and draped in the usual sterile fashion, the skin and subcutaneous tissues were generously infiltrated with 1% lidocaine for local anesthesia. A micropuncture access kit was then used to access the targeted vein. Wire was passed centrally, confirmed to be within the venous system under fluoroscopy. A small stab incision was made with an 11 blade scalpel and the sheath was then  placed over the wire. Estimated length of the catheter was then performed with the indwelling wire. Catheter was amputated at 33 cm length and placed with coaxial wire through the peel-away. Single-lumen, power injectable PICC in the brachial vein. Tip confirmed at the cavoatrial junction, and the catheter is ready for use. Stat lock was placed. Patient tolerated the procedure well and remained hemodynamically stable throughout. No complications were encountered and no significant blood loss was encountered. IMPRESSION: Status post right brachial vein PICC.  Catheter ready for use. Signed, Dulcy Fanny. Dellia Nims, RPVI [facility] Radiology Electronically Signed   By: Corrie Mckusick D.O.   On: 09/12/2019 12:36     ASSESSMENT & PLAN:  Vanessa Beard is a 69 y.o. 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 Her biopsy results show Squamous Cell Carcinoma.This is consistent  with anal cancer. She has HIV, which is a high risk for anal cancer. -Her PET from 08/04/19 showedhypermetabolicanal mass, hypermetabolicLNsin posterior perirectal space, right inguinal region, right iliac chain and gastrohepatic ligament suspicious for metastasis. Scan also showed hypermetabolic left axillary LNs, which is unusual metastatic pattern. Shedenies recent injury or injections in the left arm. -I recommend biopsy of axillary LNor EUS gastrohepatic nodetorule outdistant metastasis. She underwent EUS first on 09/08/19 with Dr Ardis Hughs. Her LN was not found so no biopsy was done.  -Although metastatic disease is not ruled out at this point,Dr. Lisbeth Beard is able to cover thegastrohepatic lymph node in the radiation field. Irecommendedconcurrent ChemoRT for 6 weeks with Mitomycin and 5FU on week 1 and 5, which is the standard therapyfordefinitive anal cancer therapy.She started on 08/15/19.She will have PICC line placed on first day of chemo on week 1 and 5. -I plan to repeat CT chest to evaluate her left axillary lymph node after she completes chemoradiation. -S/p week 4 chemoRT she is tolerating well with manageable rectal/buttock burning.  -Labs reviewed, WBC 2.2, Hg 10.4, ANC 1.6, albumin 3.2. Overall adequate to proceed with 5FU and Mitomycin (with dose reduction of mitomycin by 50% due to pancytopenia with nadir plt<50, ANC<0.5)   -F/u next week   2. Hep C, HIV -She underwentsuccessfulHep C treatment 2 years ago ([id number]) -Her HIV was contracted through sex. Her HIV is well controlled. She will continue to f/u ID Dr Graylon Good.Last CD4 count was 386 on 02/07/2019.   3. Comorbidities: HTN, Hyperparathyroidism, Osteoporosis -Continue medications and f/u with PCP.   4. Social Support  -She is divorced, lives alone and has 2 adult children who live in [place]  -She is getting bachelors degree in Criminal Justice through Online schooling, she plans to complete in 10/2019  but may postpone her final classes.  -She is currently working at [employer] as [employer] and works 8-9 hours every day. She notes her work is flexible to take time off if needed.   PLAN: -Labs reviewed, Overall adequate to proceed with 5FU and Mitomycin (with dose reduction of mitomycin by 50%) due to neutropenia and thrombocytopenia from last cycle  -remove PICC line on 4/30 -Continue Radiation  -lab and F/u next week    No problem-specific Assessment & Plan notes found for this encounter.   No orders of the defined types were placed in this encounter.  All questions were answered. The patient knows to  call the clinic with any problems, questions or concerns. No barriers to learning was detected. The total time spent in the appointment was 30 minutes.     Vanessa Merle, MD 09/12/2019   I, Joslyn Devon, am acting as scribe for Vanessa Merle, MD.   I have reviewed the above documentation for accuracy and completeness, and I agree with the above.

## 2019-09-08 ENCOUNTER — Ambulatory Visit (HOSPITAL_COMMUNITY)
Admission: RE | Admit: 2019-09-08 | Discharge: 2019-09-08 | Disposition: A | Discharge status: Home / Self Care | Payer: Medicare HMO | Attending: Gastroenterology | Admitting: Gastroenterology

## 2019-09-08 ENCOUNTER — Ambulatory Visit (HOSPITAL_COMMUNITY): Payer: Medicare HMO | Admitting: Anesthesiology

## 2019-09-08 ENCOUNTER — Ambulatory Visit: Payer: Medicare HMO

## 2019-09-08 ENCOUNTER — Encounter (HOSPITAL_COMMUNITY): Payer: Self-pay | Admitting: Gastroenterology

## 2019-09-08 ENCOUNTER — Encounter (HOSPITAL_COMMUNITY)
Admission: RE | Disposition: A | Discharge status: Home / Self Care | Payer: Self-pay | Source: Home / Self Care | Attending: Gastroenterology

## 2019-09-08 DIAGNOSIS — R591 Generalized enlarged lymph nodes: Secondary | ICD-10-CM | POA: Insufficient documentation

## 2019-09-08 DIAGNOSIS — D61818 Other pancytopenia: Secondary | ICD-10-CM | POA: Insufficient documentation

## 2019-09-08 DIAGNOSIS — Z9221 Personal history of antineoplastic chemotherapy: Secondary | ICD-10-CM | POA: Diagnosis not present

## 2019-09-08 DIAGNOSIS — C44520 Squamous cell carcinoma of anal skin: Secondary | ICD-10-CM | POA: Diagnosis not present

## 2019-09-08 DIAGNOSIS — Z923 Personal history of irradiation: Secondary | ICD-10-CM | POA: Diagnosis not present

## 2019-09-08 DIAGNOSIS — C210 Malignant neoplasm of anus, unspecified: Secondary | ICD-10-CM | POA: Diagnosis not present

## 2019-09-08 DIAGNOSIS — Z79899 Other long term (current) drug therapy: Secondary | ICD-10-CM | POA: Diagnosis not present

## 2019-09-08 DIAGNOSIS — Z791 Long term (current) use of non-steroidal anti-inflammatories (NSAID): Secondary | ICD-10-CM | POA: Diagnosis not present

## 2019-09-08 DIAGNOSIS — B1920 Unspecified viral hepatitis C without hepatic coma: Secondary | ICD-10-CM | POA: Insufficient documentation

## 2019-09-08 DIAGNOSIS — Z9049 Acquired absence of other specified parts of digestive tract: Secondary | ICD-10-CM | POA: Diagnosis not present

## 2019-09-08 DIAGNOSIS — M810 Age-related osteoporosis without current pathological fracture: Secondary | ICD-10-CM | POA: Insufficient documentation

## 2019-09-08 DIAGNOSIS — E213 Hyperparathyroidism, unspecified: Secondary | ICD-10-CM | POA: Insufficient documentation

## 2019-09-08 DIAGNOSIS — Z882 Allergy status to sulfonamides status: Secondary | ICD-10-CM | POA: Insufficient documentation

## 2019-09-08 DIAGNOSIS — Z888 Allergy status to other drugs, medicaments and biological substances status: Secondary | ICD-10-CM | POA: Diagnosis not present

## 2019-09-08 DIAGNOSIS — I10 Essential (primary) hypertension: Secondary | ICD-10-CM | POA: Diagnosis not present

## 2019-09-08 DIAGNOSIS — G4700 Insomnia, unspecified: Secondary | ICD-10-CM | POA: Insufficient documentation

## 2019-09-08 DIAGNOSIS — Z881 Allergy status to other antibiotic agents status: Secondary | ICD-10-CM | POA: Insufficient documentation

## 2019-09-08 DIAGNOSIS — D649 Anemia, unspecified: Secondary | ICD-10-CM | POA: Diagnosis not present

## 2019-09-08 DIAGNOSIS — J984 Other disorders of lung: Secondary | ICD-10-CM | POA: Diagnosis not present

## 2019-09-08 DIAGNOSIS — D123 Benign neoplasm of transverse colon: Secondary | ICD-10-CM

## 2019-09-08 DIAGNOSIS — R69 Illness, unspecified: Secondary | ICD-10-CM | POA: Diagnosis not present

## 2019-09-08 DIAGNOSIS — Z21 Asymptomatic human immunodeficiency virus [HIV] infection status: Secondary | ICD-10-CM | POA: Insufficient documentation

## 2019-09-08 DIAGNOSIS — K5730 Diverticulosis of large intestine without perforation or abscess without bleeding: Secondary | ICD-10-CM | POA: Insufficient documentation

## 2019-09-08 DIAGNOSIS — K759 Inflammatory liver disease, unspecified: Secondary | ICD-10-CM | POA: Diagnosis not present

## 2019-09-08 HISTORY — PX: ESOPHAGOGASTRODUODENOSCOPY (EGD) WITH PROPOFOL: SHX5813

## 2019-09-08 HISTORY — PX: UPPER ESOPHAGEAL ENDOSCOPIC ULTRASOUND (EUS): SHX6562

## 2019-09-08 SURGERY — UPPER ESOPHAGEAL ENDOSCOPIC ULTRASOUND (EUS)
Anesthesia: Monitor Anesthesia Care

## 2019-09-08 MED ORDER — PROPOFOL 500 MG/50ML IV EMUL
INTRAVENOUS | Status: AC
  Filled 2019-09-08: qty 50

## 2019-09-08 MED ORDER — PROPOFOL 10 MG/ML IV BOLUS
INTRAVENOUS | Status: AC
  Filled 2019-09-08: qty 20

## 2019-09-08 MED ORDER — PROPOFOL 10 MG/ML IV BOLUS
INTRAVENOUS | Status: DC | PRN
  Administered 2019-09-08 (×4): 20 mg via INTRAVENOUS
  Administered 2019-09-08: 40 mg via INTRAVENOUS
  Administered 2019-09-08 (×2): 20 mg via INTRAVENOUS
  Administered 2019-09-08: 30 mg via INTRAVENOUS
  Administered 2019-09-08: 20 mg via INTRAVENOUS

## 2019-09-08 MED ORDER — PROPOFOL 500 MG/50ML IV EMUL
INTRAVENOUS | Status: DC | PRN
  Administered 2019-09-08: 100 ug/kg/min via INTRAVENOUS

## 2019-09-08 MED ORDER — LACTATED RINGERS IV SOLN
INTRAVENOUS | Status: DC
  Administered 2019-09-08: 1000 mL via INTRAVENOUS

## 2019-09-08 MED ORDER — SODIUM CHLORIDE 0.9 % IV SOLN: INTRAVENOUS | Status: DC

## 2019-09-08 SURGICAL SUPPLY — 14 items

## 2019-09-08 NOTE — Op Note (Signed)
[facility] Patient Name: Vanessa Beard Procedure Date: 09/08/2019 MRN: [id number] Attending MD: Milus Banister , MD Date of Birth: [birth year] CSN: [id number] Age: 69 Admit Type: Outpatient Procedure:                Upper EUS Indications:              Anus squamous cell cancer diagnosed 07/2019,                            chemo/XRT started 08/15/19/ s/p 13/30 XRT treatments                            to abd and anus Providers:                Milus Banister, MD, Grace Isaac, RN, Doristine Johns, RN, Lazaro Arms, Technician Referring MD:             Truitt Merle, MD, Kyung Rudd, MD Medicines:                Monitored Anesthesia Care Complications:            No immediate complications. Estimated blood loss:                            None. Estimated Blood Loss:     Estimated blood loss: none. Estimated blood loss:                            none. Procedure:                Pre-Anesthesia Assessment:                           - Prior to the procedure, a History and Physical                            was performed, and patient medications and                            allergies were reviewed. The patient's tolerance of                            previous anesthesia was also reviewed. The risks                            and benefits of the procedure and the sedation                            options and risks were discussed with the patient.                            All questions were answered, and informed consent  was obtained. Prior Anticoagulants: The patient has                            taken no previous anticoagulant or antiplatelet                            agents. ASA Grade Assessment: II - A patient with                            mild systemic disease. After reviewing the risks                            and benefits, the patient was deemed in                            satisfactory condition to  undergo the procedure.                           After obtaining informed consent, the endoscope was                            passed under direct vision. Throughout the                            procedure, the patient's blood pressure, pulse, and                            oxygen saturations were monitored continuously. The                            [id number] Olympus Linear EUS was                            introduced through the mouth, and advanced to the                            second part of duodenum. The upper EUS was                            accomplished without difficulty. The patient                            tolerated the procedure well. Scope In: Scope Out: Findings:      ENDOSCOPIC FINDING (limited views with linear echoendoscope): :      The examined esophagus was endoscopically normal.      The entire examined stomach was endoscopically normal.      The examined duodenum was endoscopically normal.      ENDOSONOGRAPHIC FINDING: :      1. The PET avid 78mm gastrohepatic lymphnode was not visible on this       examination despite 30 minutes searching.      2. Limited views of the pancreas, liver, spleen, left kidney, left       adrenal, portal and splenic vessels were all normal. Impression:               -  The PET avid 55mm gastrohepatic lymphnode was not                            visible on this examination despite 30 minutes                            searching. Moderate Sedation:      Not Applicable - Patient had care per Anesthesia. Recommendation:           - Discharge patient to home.                           - Follow with serial imaging. Procedure Code(s):        --- Professional ---                           [phone], Esophagogastroduodenoscopy, flexible,                            transoral; with endoscopic ultrasound examination                            limited to the esophagus, stomach or duodenum, and                            adjacent  structures Diagnosis Code(s):        --- Professional ---                           R59.1, Generalized enlarged lymph nodes CPT copyright 2019 American Medical Association. All rights reserved. The codes documented in this report are preliminary and upon coder review may  be revised to meet current compliance requirements. Milus Banister, MD 09/08/2019 8:28:35 AM This report has been signed electronically. Number of Addenda: 0

## 2019-09-08 NOTE — Interval H&P Note (Signed)
History and Physical Interval Note:  09/08/2019 7:28 AM  Vanessa Beard  has presented today for surgery, with the diagnosis of gastrohepatic LN in setting of anal cancer.  The various methods of treatment have been discussed with the patient and family. After consideration of risks, benefits and other options for treatment, the patient has consented to  Procedure(s): UPPER ESOPHAGEAL ENDOSCOPIC ULTRASOUND (EUS) (N/A) ESOPHAGOGASTRODUODENOSCOPY (EGD) WITH PROPOFOL (N/A) as a surgical intervention.  The patient's history has been reviewed, patient examined, no change in status, stable for surgery.  I have reviewed the patient's chart and labs.  Questions were answered to the patient's satisfaction.     Milus Banister

## 2019-09-08 NOTE — Discharge Instructions (Signed)
YOU HAD AN ENDOSCOPIC PROCEDURE TODAY: Refer to the procedure report and other information in the discharge instructions given to you for any specific questions about what was found during the examination. If this information does not answer your questions, please call [place] office at [phone] to clarify.   YOU SHOULD EXPECT: Some feelings of bloating in the abdomen. Passage of more gas than usual. Walking can help get rid of the air that was put into your GI tract during the procedure and reduce the bloating. If you had a lower endoscopy (such as a colonoscopy or flexible sigmoidoscopy) you may notice spotting of blood in your stool or on the toilet paper. Some abdominal soreness may be present for a day or two, also.  DIET: Your first meal following the procedure should be a light meal and then it is ok to progress to your normal diet. A half-sandwich or bowl of soup is an example of a good first meal. Heavy or fried foods are harder to digest and may make you feel nauseous or bloated. Drink plenty of fluids but you should avoid alcoholic beverages for 24 hours.   ACTIVITY: Your care partner should take you home directly after the procedure. You should plan to take it easy, moving slowly for the rest of the day. You can resume normal activity the day after the procedure however YOU SHOULD NOT DRIVE, use power tools, machinery or perform tasks that involve climbing or major physical exertion for 24 hours (because of the sedation medicines used during the test).   SYMPTOMS TO REPORT IMMEDIATELY: A gastroenterologist can be reached at any hour. Please call [phone]  for any of the following symptoms:  Following upper endoscopy (EGD, EUS, ERCP, esophageal dilation) Vomiting of blood or coffee ground material  New, significant abdominal pain  New, significant chest pain or pain under the shoulder blades  Painful or persistently difficult swallowing  New shortness of breath  Black,  tarry-looking or red, bloody stools  FOLLOW UP:  If any biopsies were taken you will be contacted by phone or by letter within the next 1-3 weeks. Call [phone]  if you have not heard about the biopsies in 3 weeks.  Please also call with any specific questions about appointments or follow up tests.  

## 2019-09-08 NOTE — Anesthesia Preprocedure Evaluation (Addendum)
Anesthesia Evaluation  Patient identified by MRN, date of birth, ID band Patient awake    Reviewed: Allergy & Precautions, NPO status , Patient's Chart, lab work & pertinent test results  History of Anesthesia Complications Negative for: history of anesthetic complications  Airway Mallampati: II  TM Distance: >3 FB Neck ROM: Full    Dental  (+) Edentulous Upper, Upper Dentures, Dental Advisory Given   Pulmonary neg shortness of breath, neg recent URI,    breath sounds clear to auscultation       Cardiovascular hypertension, Pt. on medications (-) angina(-) Past MI and (-) CHF (-) dysrhythmias  Rhythm:Regular     Neuro/Psych negative neurological ROS  negative psych ROS   GI/Hepatic (+) Hepatitis -, Cgastrohepatic LN in setting of anal cancer   Endo/Other    Renal/GU      Musculoskeletal   Abdominal   Peds  Hematology  (+) Blood dyscrasia, anemia , HIV,   Anesthesia Other Findings   Reproductive/Obstetrics                            Anesthesia Physical Anesthesia Plan  ASA: III  Anesthesia Plan: MAC   Post-op Pain Management:    Induction: Intravenous  PONV Risk Score and Plan: 2 and Propofol infusion and Treatment may vary due to age or medical condition  Airway Management Planned: Nasal Cannula  Additional Equipment:   Intra-op Plan:   Post-operative Plan:   Informed Consent: I have reviewed the patients History and Physical, chart, labs and discussed the procedure including the risks, benefits and alternatives for the proposed anesthesia with the patient or authorized representative who has indicated his/her understanding and acceptance.     Dental advisory given  Plan Discussed with: CRNA and Surgeon  Anesthesia Plan Comments:         Anesthesia Quick Evaluation

## 2019-09-08 NOTE — Transfer of Care (Addendum)
Immediate Anesthesia Transfer of Care Note  Patient: Vanessa Beard  Procedure(s) Performed: Procedure(s): UPPER ESOPHAGEAL ENDOSCOPIC ULTRASOUND (EUS) (N/A) ESOPHAGOGASTRODUODENOSCOPY (EGD) WITH PROPOFOL (N/A)  Patient Location: PACU  Anesthesia Type:MAC  Level of Consciousness:  sedated, patient cooperative and responds to stimulation  Airway & Oxygen Therapy:Patient Spontanous Breathing and Patient connected to face mask oxgen  Post-op Assessment:  Report given to PACU RN and Post -op Vital signs reviewed and stable  Post vital signs:  Reviewed and stable  Last Vitals:  Vitals:   09/08/19 0700  BP: 103/88  Pulse: 74  Resp: 20  Temp: 36.8 C  SpO2: 17%    Complications: No apparent anesthesia complications

## 2019-09-09 ENCOUNTER — Ambulatory Visit: Payer: Medicare HMO

## 2019-09-09 ENCOUNTER — Encounter: Payer: Self-pay | Admitting: *Deleted

## 2019-09-12 ENCOUNTER — Inpatient Hospital Stay: Payer: Medicare HMO

## 2019-09-12 ENCOUNTER — Other Ambulatory Visit: Payer: Self-pay

## 2019-09-12 ENCOUNTER — Ambulatory Visit (HOSPITAL_COMMUNITY)
Admission: RE | Admit: 2019-09-12 | Discharge: 2019-09-12 | Disposition: A | Discharge status: Home / Self Care | Payer: Medicare HMO | Source: Ambulatory Visit | Attending: Nurse Practitioner | Admitting: Nurse Practitioner

## 2019-09-12 ENCOUNTER — Other Ambulatory Visit: Payer: Self-pay | Admitting: Nurse Practitioner

## 2019-09-12 ENCOUNTER — Inpatient Hospital Stay (HOSPITAL_BASED_OUTPATIENT_CLINIC_OR_DEPARTMENT_OTHER): Payer: Medicare HMO | Admitting: Hematology

## 2019-09-12 ENCOUNTER — Ambulatory Visit
Admission: RE | Admit: 2019-09-12 | Discharge: 2019-09-12 | Disposition: A | Discharge status: Home / Self Care | Payer: Medicare HMO | Source: Ambulatory Visit | Attending: Radiation Oncology | Admitting: Radiation Oncology

## 2019-09-12 ENCOUNTER — Inpatient Hospital Stay: Payer: Medicare HMO | Admitting: Nutrition

## 2019-09-12 ENCOUNTER — Encounter: Payer: Self-pay | Admitting: Hematology

## 2019-09-12 VITALS — BP 119/76 | HR 74 | Temp 98.2°F | Resp 18

## 2019-09-12 DIAGNOSIS — K5730 Diverticulosis of large intestine without perforation or abscess without bleeding: Secondary | ICD-10-CM | POA: Diagnosis not present

## 2019-09-12 DIAGNOSIS — I10 Essential (primary) hypertension: Secondary | ICD-10-CM | POA: Diagnosis not present

## 2019-09-12 DIAGNOSIS — C211 Malignant neoplasm of anal canal: Secondary | ICD-10-CM | POA: Diagnosis not present

## 2019-09-12 DIAGNOSIS — C210 Malignant neoplasm of anus, unspecified: Secondary | ICD-10-CM | POA: Insufficient documentation

## 2019-09-12 DIAGNOSIS — R197 Diarrhea, unspecified: Secondary | ICD-10-CM | POA: Diagnosis not present

## 2019-09-12 DIAGNOSIS — D123 Benign neoplasm of transverse colon: Secondary | ICD-10-CM | POA: Diagnosis not present

## 2019-09-12 DIAGNOSIS — D61818 Other pancytopenia: Secondary | ICD-10-CM | POA: Diagnosis not present

## 2019-09-12 DIAGNOSIS — Z5111 Encounter for antineoplastic chemotherapy: Secondary | ICD-10-CM | POA: Diagnosis not present

## 2019-09-12 DIAGNOSIS — Z452 Encounter for adjustment and management of vascular access device: Secondary | ICD-10-CM | POA: Diagnosis not present

## 2019-09-12 DIAGNOSIS — J984 Other disorders of lung: Secondary | ICD-10-CM | POA: Diagnosis not present

## 2019-09-12 DIAGNOSIS — Z510 Encounter for antineoplastic radiation therapy: Secondary | ICD-10-CM | POA: Diagnosis not present

## 2019-09-12 DIAGNOSIS — R69 Illness, unspecified: Secondary | ICD-10-CM | POA: Diagnosis not present

## 2019-09-12 DIAGNOSIS — C189 Malignant neoplasm of colon, unspecified: Secondary | ICD-10-CM | POA: Diagnosis not present

## 2019-09-12 DIAGNOSIS — C20 Malignant neoplasm of rectum: Secondary | ICD-10-CM | POA: Diagnosis not present

## 2019-09-12 DIAGNOSIS — E213 Hyperparathyroidism, unspecified: Secondary | ICD-10-CM | POA: Diagnosis not present

## 2019-09-12 LAB — CMP (CANCER CENTER ONLY)
ALT: 7 U/L (ref 0–44)
AST: 15 U/L (ref 15–41)
Albumin: 3.2 g/dL — ABNORMAL LOW (ref 3.5–5.0)
Alkaline Phosphatase: 58 U/L (ref 38–126)
Anion gap: 12 (ref 5–15)
BUN: 12 mg/dL (ref 8–23)
CO2: 24 mmol/L (ref 22–32)
Calcium: 9.1 mg/dL (ref 8.9–10.3)
Chloride: 108 mmol/L (ref 98–111)
Creatinine: 0.77 mg/dL (ref 0.44–1.00)
GFR, Est AFR Am: >60 mL/min (ref >60)
GFR, Estimated: >60 mL/min (ref >60)
Glucose, Bld: 100 mg/dL — ABNORMAL HIGH (ref 70–99)
Potassium: 3.9 mmol/L (ref 3.5–5.1)
Sodium: 144 mmol/L (ref 135–145)
Total Bilirubin: 0.4 mg/dL (ref 0.3–1.2)
Total Protein: 7.6 g/dL (ref 6.5–8.1)

## 2019-09-12 LAB — CBC WITH DIFFERENTIAL (CANCER CENTER ONLY)
Abs Immature Granulocytes: 0 10*3/uL (ref 0.00–0.07)
Basophils Absolute: 0 10*3/uL (ref 0.0–0.1)
Basophils Relative: 1 %
Eosinophils Absolute: 0.2 10*3/uL (ref 0.0–0.5)
Eosinophils Relative: 10 %
HCT: 31.3 % — ABNORMAL LOW (ref 36.0–46.0)
Hemoglobin: 10.4 g/dL — ABNORMAL LOW (ref 12.0–15.0)
Immature Granulocytes: 0 %
Lymphocytes Relative: 6 %
Lymphs Abs: 0.1 10*3/uL — ABNORMAL LOW (ref 0.7–4.0)
MCH: 31 pg (ref 26.0–34.0)
MCHC: 33.2 g/dL (ref 30.0–36.0)
MCV: 93.2 fL (ref 80.0–100.0)
Monocytes Absolute: 0.2 10*3/uL (ref 0.1–1.0)
Monocytes Relative: 11 %
Neutro Abs: 1.6 10*3/uL — ABNORMAL LOW (ref 1.7–7.7)
Neutrophils Relative %: 72 %
Platelet Count: 186 10*3/uL (ref 150–400)
RBC: 3.36 MIL/uL — ABNORMAL LOW (ref 3.87–5.11)
RDW: 14.3 % (ref 11.5–15.5)
WBC Count: 2.2 10*3/uL — ABNORMAL LOW (ref 4.0–10.5)
nRBC: 0 % (ref 0.0–0.2)

## 2019-09-12 MED ORDER — LIDOCAINE HCL 1 % IJ SOLN
INTRAMUSCULAR | Status: AC
  Filled 2019-09-12: qty 20

## 2019-09-12 MED ORDER — MITOMYCIN CHEMO IV INJECTION 20 MG
5.0000 mg/m2 | Freq: Once | INTRAVENOUS | Status: AC
  Administered 2019-09-12: 9 mg via INTRAVENOUS
  Filled 2019-09-12: qty 18

## 2019-09-12 MED ORDER — PROCHLORPERAZINE MALEATE 10 MG PO TABS
10.0000 mg | ORAL_TABLET | Freq: Once | ORAL | Status: AC
  Administered 2019-09-12: 10 mg via ORAL

## 2019-09-12 MED ORDER — SODIUM CHLORIDE 0.9 % IV SOLN
Freq: Once | INTRAVENOUS | Status: AC
  Filled 2019-09-12: qty 250

## 2019-09-12 MED ORDER — LIDOCAINE HCL 1 % IJ SOLN
INTRAMUSCULAR | Status: DC | PRN
  Administered 2019-09-12: 5 mL

## 2019-09-12 MED ORDER — PROCHLORPERAZINE MALEATE 10 MG PO TABS
ORAL_TABLET | ORAL | Status: AC
  Filled 2019-09-12: qty 1

## 2019-09-12 MED ORDER — SODIUM CHLORIDE 0.9 % IV SOLN
1000.0000 mg/m2/d | INTRAVENOUS | Status: DC
  Administered 2019-09-12: 7150 mg via INTRAVENOUS
  Filled 2019-09-12: qty 143

## 2019-09-12 NOTE — Procedures (Signed)
Interventional Radiology Procedure Note  Procedure: Placement of a right brachial vein single lumen PICC  Tip is positioned at the superior cavoatrial junction and catheter is ready for immediate use.  Complications: None Recommendations:  - Ok to use - Do not submerge - Routine line care   Signed,  Marti Acebo S. Brendaly Townsel, DO   

## 2019-09-12 NOTE — Patient Instructions (Signed)
[facility] Discharge Instructions for Patients Receiving Chemotherapy  Today you received the following chemotherapy agents Mitomycin and Adrucil  To help prevent nausea and vomiting after your treatment, we encourage you to take your nausea medication as directed.   If you develop nausea and vomiting that is not controlled by your nausea medication, call the clinic.   BELOW ARE SYMPTOMS THAT SHOULD BE REPORTED IMMEDIATELY:  *FEVER GREATER THAN 100.5 F  *CHILLS WITH OR WITHOUT FEVER  NAUSEA AND VOMITING THAT IS NOT CONTROLLED WITH YOUR NAUSEA MEDICATION  *UNUSUAL SHORTNESS OF BREATH  *UNUSUAL BRUISING OR BLEEDING  TENDERNESS IN MOUTH AND THROAT WITH OR WITHOUT PRESENCE OF ULCERS  *URINARY PROBLEMS  *BOWEL PROBLEMS  UNUSUAL RASH Items with * indicate a potential emergency and should be followed up as soon as possible.  Feel free to call the clinic should you have any questions or concerns. The clinic phone number is [phone].  Please show the [facility] at check-in to the Emergency Department and triage nurse.

## 2019-09-12 NOTE — Progress Notes (Signed)
Ok to treat with [place] today per MD Burr Medico

## 2019-09-12 NOTE — Progress Notes (Signed)
Nutrition follow-up completed with patient during infusion for anal cancer. Patient reports she has done well since her last treatment. She denies nutrition impact symptoms. Weight is stable at 165 pounds. She has no questions or concerns.  Nutrition diagnosis: Food and nutrition related knowledge deficit improved.  Intervention: Educated patient on strategies for adequate calories and protein for weight maintenance.  Provided support and encouragement.  Monitoring, evaluation, goals: Patient will tolerate adequate calories and protein for weight maintenance.  Next visit: Patient will contact me for questions or concerns.  She has my contact information.  **Disclaimer: This note was dictated with voice recognition software. Similar sounding words can inadvertently be transcribed and this note may contain transcription errors which may not have been corrected upon publication of note.**

## 2019-09-13 ENCOUNTER — Ambulatory Visit
Admission: RE | Admit: 2019-09-13 | Discharge: 2019-09-13 | Disposition: A | Discharge status: Home / Self Care | Payer: Medicare HMO | Source: Ambulatory Visit | Attending: Radiation Oncology | Admitting: Radiation Oncology

## 2019-09-13 ENCOUNTER — Other Ambulatory Visit: Payer: Self-pay

## 2019-09-13 DIAGNOSIS — C210 Malignant neoplasm of anus, unspecified: Secondary | ICD-10-CM | POA: Diagnosis not present

## 2019-09-13 DIAGNOSIS — C20 Malignant neoplasm of rectum: Secondary | ICD-10-CM | POA: Diagnosis not present

## 2019-09-13 DIAGNOSIS — Z510 Encounter for antineoplastic radiation therapy: Secondary | ICD-10-CM | POA: Diagnosis not present

## 2019-09-14 ENCOUNTER — Ambulatory Visit: Payer: Medicare HMO

## 2019-09-15 ENCOUNTER — Ambulatory Visit: Payer: Medicare HMO

## 2019-09-15 NOTE — Progress Notes (Signed)
[facility]   [phone] [phone]   Clinic Follow up Note   Patient Care Team: Seward Carol, MD as PCP - General (Internal Medicine) Carlyle Basques, MD as PCP - Infectious Diseases (Infectious Diseases) Binnie Rail, DC as Referring Physician (Chiropractic Medicine) Jonnie Finner, RN as Oncology Nurse Navigator 09/19/2019  CHIEF COMPLAINT: F/u anal cancer   SUMMARY OF ONCOLOGIC HISTORY: Oncology History Overview Note  Cancer Staging Anal cancer ([place]) Staging form: Anus, AJCC 8th Edition - Clinical: Stage IV (cT2, cN0, cM1) - Signed by Truitt Merle, MD on 08/12/2019    Anal cancer ([place])  07/19/2019 Procedure   Colonoscopy by Dr Havery Moros 07/19/19  IMPRESSION - Ulcer noted at the anal canal in posterior midline canal that extends into the distal rectum on digital rectal exam, nodular and somewhat hard to palpation. Endoscopic images show nodular tissue in the area, concerning for malignant ulcer. - The examined portion of the ileum was normal. - One 3 mm polyp at the hepatic flexure, removed with a cold snare. Resected and retrieved. - One 5 mm polyp in the transverse colon, removed with a cold snare. Resected and retrieved. - Diverticulosis in the ascending colon. - The examination was otherwise normal.   07/19/2019 Initial Biopsy   Diagnosis 07/19/19 1. Transverse Colon Polyp, hepatic flexure (2) - TUBULAR ADENOMA (1 OF 3 FRAGMENTS) - BENIGN COLONIC MUCOSA (2 OF 3 FRAGMENTS) - NO HIGH GRADE DYSPLASIA OR MALIGNANCY IDENTIFIED 2. Rectum, biopsy, distal rectal anal canal - SQUAMOUS CELL CARCINOMA - SEE COMMENT   07/26/2019 Initial Diagnosis   Anal cancer ([place])   07/26/2019 Cancer Staging   Staging form: Anus, AJCC 8th Edition - Clinical: Stage IV (cT2, cN1c, cM1) - Signed by Truitt Merle, MD on 08/12/2019   08/04/2019 PET scan   IMPRESSION: Hypermetabolic anal soft tissue mass, consistent with known primary anal carcinoma.   Sub-cm  hypermetabolic lymph nodes in posterior perirectal space, right inguinal region, right iliac chain, and gastrohepatic ligament, suspicious for metastatic disease.   Small hypermetabolic left axillary lymph nodes. This would be unusual location for metastatic anal carcinoma. Recommend clinical correlation for possibility the patient has had recent COVID vaccination in the left arm, which could explain this finding.   Multifocal airspace disease in both lower lungs with marked hypermetabolic activity. This favors infectious or inflammatory etiology, and is not typical for pulmonary metastases. Short-term follow-up by chest CT is recommended.   08/15/2019 -  Radiation Therapy   Concurrent ChemoRT with Dr. Lisbeth Renshaw starting 08/15/19   08/15/2019 - 09/12/2019 Chemotherapy   Concurrent ChemoRT with Mitomycin and 5FU on week 1 and 5 starting 08/15/19 with week 5 dose on 09/12/19.      CURRENT THERAPY:  Concurrent chemoRT with Mitomycin and 5FU on week 1 and 5 for 6 weeks starting 08/15/19.  INTERVAL HISTORY: Vanessa Beard returns for f/u as scheduled. She completed week 5 chemo from 3/26 - 3/30. She continues radiation. She has not had much diarrhea until today after drinking chocolate milk, she has had 4 episodes. She forgot to take imodium while hurrying to get here on time but notes it doesn't work "fast enough." She was lightheaded on the toilet this morning but none recently, no dizziness on standing. Has some rectal discharge but no bleeding. She is eating and drinking adequately. Denies mucositis. Mild intermittent nausea is well controlled. Rectal pain is 8/10 without medication. Oxycodone improves to 6-7/10. She takes it mainly at night and tylenol during the day  while she drives. Energy is low, but able to be out of bed and functional at home. No fever, chills, cough, chest pain, dyspnea.    MEDICAL HISTORY:  Past Medical History:  Diagnosis Date  . Allergic rhinitis   . Biliary colic   .  Gall bladder disease \  . Hepatitis C   . Herpes   . HIV infection ([place])   . Hyperparathyroidism ([place])   . Hypertension   . Insomnia   . Osteoporosis   . Pneumonia    2010    SURGICAL HISTORY: Past Surgical History:  Procedure Laterality Date  . CHOLECYSTECTOMY N/A 01/09/2015   Procedure: LAPAROSCOPIC CHOLECYSTECTOMY;  Surgeon: [name], MD;  Location: [facility];  Service: General;  Laterality: N/A;  . ECTOPIC PREGNANCY SURGERY    . ESOPHAGOGASTRODUODENOSCOPY (EGD) WITH PROPOFOL N/A 09/08/2019   Procedure: ESOPHAGOGASTRODUODENOSCOPY (EGD) WITH PROPOFOL;  Surgeon: Milus Banister, MD;  Location: [facility];  Service: Endoscopy;  Laterality: N/A;  . IR FLUORO GUIDE CV LINE LEFT  08/15/2019  . ORIF TIBIA PLATEAU Right 02/18/2017   Procedure: OPEN REDUCTION INTERNAL FIXATION (ORIF) RIGHT TIBIAL PLATEAU;  Surgeon: Leandrew Koyanagi, MD;  Location: [facility];  Service: Orthopedics;  Laterality: Right;  . SHOULDER SURGERY Right 12/2014  . UPPER ESOPHAGEAL ENDOSCOPIC ULTRASOUND (EUS) N/A 09/08/2019   Procedure: UPPER ESOPHAGEAL ENDOSCOPIC ULTRASOUND (EUS);  Surgeon: Milus Banister, MD;  Location: [facility];  Service: Endoscopy;  Laterality: N/A;    I have reviewed the social history and family history with the patient and they are unchanged from previous note.  ALLERGIES:  is allergic to dapsone; retrovir [zidovudine]; and sulfamethoxazole-trimethoprim.  MEDICATIONS:  Current Outpatient Medications  Medication Sig Dispense Refill  . acetaminophen (TYLENOL) 500 MG tablet Take 1,000 mg by mouth every 6 (six) hours as needed for moderate pain or headache.    . AMBULATORY NON FORMULARY MEDICATION Nitroglycerine ointment 0.125 %  Apply a pea sized amount internally four times daily. 30 g 1  . amLODipine (NORVASC) 10 MG tablet Take 1 tablet (10 mg total) by mouth daily. 90 tablet 3  . fluconazole (DIFLUCAN) 150 MG tablet Take 1 tablet (150 mg total) by mouth daily. 2 tablet 0  . ibuprofen  (ADVIL) 800 MG tablet TAKE 1 TABLET BY MOUTH EVERY 8 HOURS AS NEEDED 90 tablet 0  . ondansetron (ZOFRAN) 8 MG tablet Take 1 tablet (8 mg total) by mouth 2 (two) times daily as needed (Nausea or vomiting). 30 tablet 1  . oxyCODONE (OXY IR/ROXICODONE) 5 MG immediate release tablet Take 1-2 tablets (5-10 mg total) by mouth every 6 (six) hours as needed for severe pain. 60 tablet 0  . prochlorperazine (COMPAZINE) 10 MG tablet Take 1 tablet (10 mg total) by mouth every 6 (six) hours as needed (Nausea or vomiting). 30 tablet 1  . TRIUMEQ 600-50-300 MG tablet TAKE 1 TABLET BY MOUTH DAILY (Patient taking differently: Take 1 tablet by mouth daily. ) 30 tablet 3  . zolpidem (AMBIEN) 10 MG tablet Take 1 tablet (10 mg total) by mouth at bedtime as needed. (Patient taking differently: Take 10 mg by mouth at bedtime as needed for sleep. ) 30 tablet 1  . diphenoxylate-atropine (LOMOTIL) 2.5-0.025 MG tablet Take 1-2 tablets by mouth 4 (four) times daily as needed for diarrhea or loose stools. 30 tablet 1  . potassium chloride SA (KLOR-CON) 20 MEQ tablet Take 1 tablet (20 mEq total) by mouth 2 (two) times daily. 30 tablet 0   No  current facility-administered medications for this visit.    PHYSICAL EXAMINATION: ECOG PERFORMANCE STATUS: 1 - Symptomatic but completely ambulatory  Vitals:   09/19/19 1339  BP: 132/79  Pulse: 100  Resp: 18  Temp: 98.3 F (36.8 C)  SpO2: 98%   Filed Weights   09/19/19 1339  Weight: 162 lb 14.4 oz (73.9 kg)    GENERAL:alert, no distress and comfortable SKIN: no rash  EYES: sclera clear OROPHARYNX: no thrush or ulcers LUNGS:  normal breathing effort HEART:  no lower extremity edema NEURO: alert & oriented x 3 with fluent speech, normal gait RECTAL: moisture and hyperpigmentation noted to gluteal cleft. 2 small shallow ulcerations along the left glute  LABORATORY DATA:  I have reviewed the data as listed CBC Latest Ref Rng & Units 09/19/2019 09/12/2019 09/07/2019  WBC 4.0  - 10.5 K/uL 1.5(L) 2.2(L) 2.9(L)  Hemoglobin 12.0 - 15.0 g/dL 9.4(L) 10.4(L) 10.1(L)  Hematocrit 36.0 - 46.0 % 28.7(L) 31.3(L) 30.8(L)  Platelets 150 - 400 K/uL 179 186 108(L)     CMP Latest Ref Rng & Units 09/19/2019 09/12/2019 09/07/2019  Glucose 70 - 99 mg/dL 109(H) 100(H) 103(H)  BUN 8 - 23 mg/dL 13 12 13   Creatinine 0.44 - 1.00 mg/dL 0.82 0.77 0.91  Sodium 135 - 145 mmol/L 142 144 139  Potassium 3.5 - 5.1 mmol/L 3.2(L) 3.9 3.4(L)  Chloride 98 - 111 mmol/L 108 108 106  CO2 22 - 32 mmol/L 24 24 25   Calcium 8.9 - 10.3 mg/dL 9.2 9.1 9.3  Total Protein 6.5 - 8.1 g/dL 7.9 7.6 7.7  Total Bilirubin 0.3 - 1.2 mg/dL 0.5 0.4 0.4  Alkaline Phos 38 - 126 U/L 55 58 72  AST 15 - 41 U/L 17 15 18   ALT 0 - 44 U/L 8 7 8       RADIOGRAPHIC STUDIES: I have personally reviewed the radiological images as listed and agreed with the findings in the report. No results found.   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 Her biopsy results show Squamous Cell Carcinoma.This is consistent with anal cancer. She has HIV, which is a high risk for anal cancer. -PET from 08/04/19 showedhypermetabolicanal mass, hypermetabolicLNsin posterior perirectal space, right inguinal region, right iliac chain and gastrohepatic ligament suspicious for metastasis. Scan also showed hypermetabolic left axillary LNs, which is unusual metastatic pattern. Shedenies recent injury or injections in the left arm.A biopsy has been recommendedtorule outdistant metastasis. Upon EUS on 09/08/19, the LN was not found so biopsy was not done  -Althoughmetastatic disease is not ruled out at this point,Dr. Lisbeth Renshaw is able to cover thegastrohepatic lymph  node in the radiation field. -She began concurrent chemoRT with mitomycin and 5FU (week 1 and week 5) on 08/15/19, RT held on days 17 and 18 for neutropenia ANC 0.3 which recovered and she resumed RT on 09/02/19 .  -Plan to repeat CT chest to evaluate her left axillary lymph node after she completes chemoradiation. -She began week 5 chemo with 50% dose reduced mitomycin due to pancytopenia from 09/12/19 - 09/16/19. She tolerated well so far except diarrhea today. She has tried imodium which is not completely effective. I recommend lomotil -Labs reviewed. ANC 1.2. We reviewed infection precautions, plan to repeat labs on 09/23/19. K 3.2 likely due to diarrhea.  I recommend to start oral K BID and remain well hydrated.  -F/u next week. Anticipate completing RT 10/04/19   2. Pancytopenia, secondary to chemoRT -Nadir plt 44K on day 15 and ANC 0.3 on day 17; anemia remained mild and stable in 10 range -Mitomycin dose reduced 50% for day 28 chemo. ANC 1.2 today  -repeat CBC on Friday of this week   3. Hep C, HIV -She underwentsuccessfulHep C treatment 2 years ago [id number]) -Her HIV was contracted through sex. Her HIV is well controlled. She will continue to f/u ID Dr Graylon Good.Last CD4 count was 386 on 02/07/2019. -Compliant with Triumeq  4. Comorbidities: HTN, Hyperparathyroidism, Osteoporosis -Continue medications and f/u with PCP. -BP 132/79 today  5. Social Support  -She is divorced, lives alone and has 2 adult children who live in [place]  -She is getting bachelors degree in [place] through [employer] schooling, she plans to resume classes in 10/2019, she prefers not to postpone if possible.   -She is currently working at [employer] as [employer] and works 8-9 hours every day. She notes her work is flexible to take time off if needed.  PLAN: -Labs reviewed -ANC 1.2 -Repeat lab Friday this week  -Refill oxycodone, new Rx: lomotil and potassium  -Remain hydrated, reviewed  infection precautions  -F/u next week   All questions were answered. The patient knows to call the clinic with any problems, questions or concerns. No barriers to learning was detected.     Alla Feeling, NP 09/19/19

## 2019-09-16 ENCOUNTER — Inpatient Hospital Stay: Payer: Medicare HMO

## 2019-09-16 ENCOUNTER — Ambulatory Visit
Admission: RE | Admit: 2019-09-16 | Discharge: 2019-09-16 | Disposition: A | Discharge status: Home / Self Care | Payer: Medicare HMO | Source: Ambulatory Visit | Attending: Radiation Oncology | Admitting: Radiation Oncology

## 2019-09-16 ENCOUNTER — Other Ambulatory Visit: Payer: Self-pay

## 2019-09-16 VITALS — BP 92/74 | HR 85 | Temp 97.6°F | Resp 18

## 2019-09-16 DIAGNOSIS — D61818 Other pancytopenia: Secondary | ICD-10-CM | POA: Diagnosis not present

## 2019-09-16 DIAGNOSIS — C210 Malignant neoplasm of anus, unspecified: Secondary | ICD-10-CM | POA: Diagnosis not present

## 2019-09-16 DIAGNOSIS — Z95828 Presence of other vascular implants and grafts: Secondary | ICD-10-CM

## 2019-09-16 DIAGNOSIS — C20 Malignant neoplasm of rectum: Secondary | ICD-10-CM | POA: Diagnosis not present

## 2019-09-16 DIAGNOSIS — Z510 Encounter for antineoplastic radiation therapy: Secondary | ICD-10-CM | POA: Diagnosis not present

## 2019-09-16 DIAGNOSIS — D123 Benign neoplasm of transverse colon: Secondary | ICD-10-CM | POA: Diagnosis not present

## 2019-09-16 DIAGNOSIS — R197 Diarrhea, unspecified: Secondary | ICD-10-CM | POA: Diagnosis not present

## 2019-09-16 DIAGNOSIS — K5730 Diverticulosis of large intestine without perforation or abscess without bleeding: Secondary | ICD-10-CM | POA: Diagnosis not present

## 2019-09-16 DIAGNOSIS — J984 Other disorders of lung: Secondary | ICD-10-CM | POA: Diagnosis not present

## 2019-09-16 DIAGNOSIS — R69 Illness, unspecified: Secondary | ICD-10-CM | POA: Diagnosis not present

## 2019-09-16 DIAGNOSIS — I10 Essential (primary) hypertension: Secondary | ICD-10-CM | POA: Diagnosis not present

## 2019-09-16 DIAGNOSIS — E213 Hyperparathyroidism, unspecified: Secondary | ICD-10-CM | POA: Diagnosis not present

## 2019-09-16 DIAGNOSIS — Z5111 Encounter for antineoplastic chemotherapy: Secondary | ICD-10-CM | POA: Diagnosis not present

## 2019-09-16 DIAGNOSIS — C211 Malignant neoplasm of anal canal: Secondary | ICD-10-CM | POA: Diagnosis not present

## 2019-09-16 MED ORDER — HEPARIN SOD (PORK) LOCK FLUSH 100 UNIT/ML IV SOLN
500.0000 [IU] | Freq: Once | INTRAVENOUS | Status: AC
  Administered 2019-09-16: 250 [IU] via INTRAVENOUS
  Filled 2019-09-16: qty 5

## 2019-09-16 MED ORDER — SODIUM CHLORIDE 0.9% FLUSH
10.0000 mL | INTRAVENOUS | Status: DC | PRN
  Administered 2019-09-16: 10 mL via INTRAVENOUS
  Filled 2019-09-16: qty 10

## 2019-09-16 NOTE — Anesthesia Postprocedure Evaluation (Signed)
Anesthesia Post Note  Patient: [name]  Procedure(s) Performed: UPPER ESOPHAGEAL ENDOSCOPIC ULTRASOUND (EUS) (N/A ) ESOPHAGOGASTRODUODENOSCOPY (EGD) WITH PROPOFOL (N/A )     Patient location during evaluation: Endoscopy Anesthesia Type: MAC Level of consciousness: awake and patient cooperative Pain management: pain level controlled Vital Signs Assessment: post-procedure vital signs reviewed and stable Respiratory status: spontaneous breathing, nonlabored ventilation, respiratory function stable and patient connected to nasal cannula oxygen Cardiovascular status: stable and blood pressure returned to baseline Postop Assessment: no apparent nausea or vomiting Anesthetic complications: no    Last Vitals:  Vitals:   09/08/19 0840 09/08/19 0850  BP: (!) 160/74 117/77  Pulse: 73 63  Resp: 19 18  Temp:    SpO2: 97% 93%    Last Pain:  Vitals:   09/08/19 0850  TempSrc:   PainSc: 0-No pain                 Kaven Cumbie

## 2019-09-16 NOTE — Patient Instructions (Signed)
PICC Removal, Adult, Care After This sheet gives you information about how to care for yourself after your procedure. Your health care provider may also give you more specific instructions. If you have problems or questions, contact your health care provider. What can I expect after the procedure? After your procedure, it is common to have:  Tenderness or soreness.  Redness, swelling, or a scab where the PICC was removed (exit site). Follow these instructions at home: For the first 24 hours after the procedure   Keep the bandage (dressing) on the exit site clean and dry. Do not remove the dressing until your health care provider tells you to do so.  Check your arm often for signs and symptoms of an infection. Check for: ? A red streak that spreads away from the dressing. ? Blood or fluid that you can see on the dressing. ? More redness or swelling.  Do not lift anything heavy or do activities that require great effort until your health care provider says it is okay. You should avoid: ? Lifting weights. ? Yard work. ? Any physical activity with repetitive arm movement.  Watch closely for any signs of an air bubble in the vein (air embolism). This is a rare but serious complication. If you have signs of air embolism, call 911 immediately and lie down on your left side to keep the air from moving into the lungs. Signs of an air embolism include: ? Difficulty breathing. ? Chest pain. ? Coughing or wheezing. ? Skin that is pale, blue, cold, or clammy. ? Rapid pulse. ? Rapid breathing. ? Fainting. After 24 Hours have passed:  Remove your dressing as told by your health care provider. Make sure you wash your hands with soap and water before and after you change the dressing. If soap and water are not available, use hand sanitizer.  Return to your normal activities as told by your health care provider.  A small scab may develop over the exit site. Do not pick at the scab.  When  bathing or showering, gently wash the exit site with soap and water. Pat it dry.  Watch for signs of infection, such as: ? Fever or chills. ? Swollen glands under the arm. ? More redness, swelling, or soreness in the arm. ? Blood, fluid, or pus coming from the exit site. ? Warmth or a bad smell at the exit site. ? A red streak spreading away from the exit site. General instructions  Take over-the-counter and prescription medicines only as told by your health care provider. Do not take any new medicines without checking with your health care provider first.  If you were prescribed an antibiotic medicine, apply or take it as told by your health care provider. Do not stop using the antibiotic even if your condition improves.  Keep all follow-up visits as told by your health care provider. This is important. Contact a health care provider if:  You have a fever or chills.  You have soreness, redness, or swelling on your exit site, and it gets worse.  You have swollen glands under your arm.  You have any of the following symptoms at your exit site: ? Blood, fluid, or pus. ? Unusual warmth. ? A bad smell. ? A red streak spreading away from the exit site. Get help right away if:  You have numbness or tingling in your fingers, hand, or arm.  Your arm looks blue and feels cold.  You have signs of an air embolism, such   as: ? Difficulty breathing. ? Chest pain. ? Coughing or wheezing. ? Skin that is pale, blue, cold, or clammy. ? Rapid pulse. ? Rapid breathing. ? Fainting. These symptoms may represent a serious problem that is an emergency. Do not wait to see if the symptoms will go away. Get medical help right away. Call your local emergency services (911 in the [place] Do not drive yourself to the hospital. Summary  After your procedure, it is common to have tenderness or soreness, redness, swelling, or a scab at the exit site.  Keep the dressing over the exit site clean and dry.  Do not remove the dressing until your health care provider tells you to do so.  Do not lift anything heavy or do activities that require great effort until your health care provider says it is okay.  Watch closely for any signs of an air embolism. If you have signs of air embolism, call 911 immediately and lie down on your left side. This information is not intended to replace advice given to you by your health care provider. Make sure you discuss any questions you have with your health care provider. Document Revised: 04/17/2017 Document Reviewed: 07/01/2016 Elsevier Patient Education  2020 [employer] Inc.  

## 2019-09-16 NOTE — Progress Notes (Signed)
RUE PICC line removed per policy. Hemostasis achieved with light direct pressure. Sterile gauze occlusive dressing applied to site. At time of discharge, dressing clean/dry/intact. Patient verbalized no complaints.

## 2019-09-16 NOTE — Patient Instructions (Signed)
PICC Home Care Guide  A peripherally inserted central catheter (PICC) is a form of IV access that allows medicines and IV fluids to be quickly distributed throughout the body. The PICC is a long, thin, flexible tube (catheter) that is inserted into a vein in the upper arm. The catheter ends in a large vein in the chest (superior vena cava, or SVC). After the PICC is inserted, a chest X-ray may be done to make sure that it is in the correct place. A PICC may be placed for different reasons, such as:  To give medicines and liquid nutrition.  To give IV fluids and blood products.  If there is trouble placing a peripheral intravenous (PIV) catheter. If taken care of properly, a PICC can remain in place for several months. Having a PICC can also allow a person to go home from the hospital sooner. Medicine and PICC care can be managed at home by a family member, caregiver, or home health care team. What are the risks? Generally, having a PICC is safe. However, problems may occur, including:  A blood clot (thrombus) forming in or at the tip of the PICC.  A blood clot forming in a vein (deep vein thrombosis) or traveling to the lung (pulmonary embolism).  Inflammation of the vein (phlebitis) in which the PICC is placed.  Infection. Central line associated blood stream infection (CLABSI) is a serious infection that often requires hospitalization.  PICC movement (malposition). The PICC tip may move from its original position due to excessive physical activity, forceful coughing, sneezing, or vomiting.  A break or cut in the PICC. It is important not to use scissors near the PICC.  Nerve or tendon irritation or injury during PICC insertion. How to take care of your PICC Preventing problems  You and any caregivers should wash your hands often with soap. Wash hands: ? Before touching the PICC line or the infusion device. ? Before changing a bandage (dressing).  Flush the PICC as told by your  health care provider. Let your health care provider know right away if the PICC is hard to flush or does not flush. Do not use force to flush the PICC.  Do not use a syringe that is less than 10 mL to flush the PICC.  Avoid blood pressure checks on the arm in which the PICC is placed.  Never pull or tug on the PICC.  Do not take the PICC out yourself. Only a trained clinical professional should remove the PICC.  Use clean and sterile supplies only. Keep the supplies in a dry place. Do not reuse needles, syringes, or any other supplies. Doing that can lead to infection.  Keep pets and children away from your PICC line.  Check the PICC insertion site every day for signs of infection. Check for: ? Leakage. ? Redness, swelling, or pain. ? Fluid or blood. ? Warmth. ? Pus or a bad smell. PICC dressing care  Keep your PICC bandage (dressing) clean and dry to prevent infection.  Do not take baths, swim, or use a hot tub until your health care provider approves. Ask your health care provider if you can take showers. You may only be allowed to take sponge baths for bathing. When you are allowed to shower: ? Ask your health care provider to teach you how to wrap the PICC line. ? Cover the PICC line with clear plastic wrap and tape to keep it dry while showering.  Follow instructions from your health care provider   about how to take care of your insertion site and dressing. Make sure you: ? Wash your hands with soap and water before you change your bandage (dressing). If soap and water are not available, use hand sanitizer. ? Change your dressing as told by your health care provider. ? Leave stitches (sutures), skin glue, or adhesive strips in place. These skin closures may need to stay in place for 2 weeks or longer. If adhesive strip edges start to loosen and curl up, you may trim the loose edges. Do not remove adhesive strips completely unless your health care provider tells you to do  that.  Change your PICC dressing if it becomes loose or wet. General instructions   Carry your PICC identification card or wear a medical alert bracelet at all times.  Keep the tube clamped at all times, unless it is being used.  Carry a smooth-edge clamp with you at all times to place on the tube if it breaks.  Do not use scissors or sharp objects near the tube.  You may bend your arm and move it freely. If your PICC is near or at the bend of your elbow, avoid activity with repeated motion at the elbow.  Avoid lifting heavy objects as told by your health care provider.  Keep all follow-up visits as told by your health care provider. This is important. Disposal of supplies  Throw away any syringes in a disposal container that is meant for sharp items (sharps container). You can buy a sharps container from a pharmacy, or you can make one by using an empty hard plastic bottle with a cover.  Place any used dressings or infusion bags into a plastic bag. Throw that bag in the trash. Contact a health care provider if:  You have pain in your arm, ear, face, or teeth.  You have a fever or chills.  You have redness, swelling, or pain around the insertion site.  You have fluid or blood coming from the insertion site.  Your insertion site feels warm to the touch.  You have pus or a bad smell coming from the insertion site.  Your skin feels hard and raised around the insertion site. Get help right away if:  Your PICC is accidentally pulled all the way out. If this happens, cover the insertion site with a bandage or gauze dressing. Do not throw the PICC away. Your health care provider will need to check it.  Your PICC was tugged or pulled and has partially come out. Do not  push the PICC back in.  You cannot flush the PICC, it is hard to flush, or the PICC leaks around the insertion site when it is flushed.  You hear a "flushing" sound when the PICC is flushed.  You feel your  heart racing or skipping beats.  There is a hole or tear in the PICC.  You have swelling in the arm in which the PICC was inserted.  You have a red streak going up your arm from where the PICC was inserted. Summary  A peripherally inserted central catheter (PICC) is a long, thin, flexible tube (catheter) that is inserted into a vein in the upper arm.  The PICC is inserted using a sterile technique by a specially trained nurse or physician. Only a trained clinical professional should remove it.  Keep your PICC identification card with you at all times.  Avoid blood pressure checks on the arm in which the PICC is placed.  If cared for   properly, a PICC can remain in place for several months. Having a PICC can also allow a person to go home from the hospital sooner. This information is not intended to replace advice given to you by your health care provider. Make sure you discuss any questions you have with your health care provider. Document Revised: 04/17/2017 Document Reviewed: 06/07/2016 Elsevier Patient Education  2020 Elsevier Inc.  

## 2019-09-19 ENCOUNTER — Telehealth: Payer: Self-pay | Admitting: *Deleted

## 2019-09-19 ENCOUNTER — Inpatient Hospital Stay: Payer: Medicare HMO

## 2019-09-19 ENCOUNTER — Ambulatory Visit
Admission: RE | Admit: 2019-09-19 | Discharge: 2019-09-19 | Disposition: A | Discharge status: Home / Self Care | Payer: Medicare HMO | Source: Ambulatory Visit | Attending: Radiation Oncology | Admitting: Radiation Oncology

## 2019-09-19 ENCOUNTER — Other Ambulatory Visit: Payer: Self-pay

## 2019-09-19 ENCOUNTER — Encounter: Payer: Self-pay | Admitting: Nurse Practitioner

## 2019-09-19 ENCOUNTER — Inpatient Hospital Stay: Payer: Medicare HMO | Attending: Hematology | Admitting: Nurse Practitioner

## 2019-09-19 VITALS — BP 132/79 | HR 100 | Temp 98.3°F | Resp 18 | Ht 60.0 in | Wt 162.9 lb

## 2019-09-19 DIAGNOSIS — E213 Hyperparathyroidism, unspecified: Secondary | ICD-10-CM | POA: Diagnosis not present

## 2019-09-19 DIAGNOSIS — T451X5A Adverse effect of antineoplastic and immunosuppressive drugs, initial encounter: Secondary | ICD-10-CM | POA: Insufficient documentation

## 2019-09-19 DIAGNOSIS — C20 Malignant neoplasm of rectum: Secondary | ICD-10-CM | POA: Insufficient documentation

## 2019-09-19 DIAGNOSIS — Z923 Personal history of irradiation: Secondary | ICD-10-CM | POA: Diagnosis not present

## 2019-09-19 DIAGNOSIS — C210 Malignant neoplasm of anus, unspecified: Secondary | ICD-10-CM | POA: Diagnosis not present

## 2019-09-19 DIAGNOSIS — J984 Other disorders of lung: Secondary | ICD-10-CM | POA: Diagnosis not present

## 2019-09-19 DIAGNOSIS — Z881 Allergy status to other antibiotic agents status: Secondary | ICD-10-CM | POA: Insufficient documentation

## 2019-09-19 DIAGNOSIS — Z21 Asymptomatic human immunodeficiency virus [HIV] infection status: Secondary | ICD-10-CM | POA: Diagnosis not present

## 2019-09-19 DIAGNOSIS — D123 Benign neoplasm of transverse colon: Secondary | ICD-10-CM | POA: Insufficient documentation

## 2019-09-19 DIAGNOSIS — R5383 Other fatigue: Secondary | ICD-10-CM | POA: Insufficient documentation

## 2019-09-19 DIAGNOSIS — K5730 Diverticulosis of large intestine without perforation or abscess without bleeding: Secondary | ICD-10-CM | POA: Insufficient documentation

## 2019-09-19 DIAGNOSIS — R300 Dysuria: Secondary | ICD-10-CM | POA: Insufficient documentation

## 2019-09-19 DIAGNOSIS — B1920 Unspecified viral hepatitis C without hepatic coma: Secondary | ICD-10-CM | POA: Insufficient documentation

## 2019-09-19 DIAGNOSIS — K6289 Other specified diseases of anus and rectum: Secondary | ICD-10-CM | POA: Insufficient documentation

## 2019-09-19 DIAGNOSIS — Z79899 Other long term (current) drug therapy: Secondary | ICD-10-CM | POA: Insufficient documentation

## 2019-09-19 DIAGNOSIS — C211 Malignant neoplasm of anal canal: Secondary | ICD-10-CM | POA: Diagnosis present

## 2019-09-19 DIAGNOSIS — D61810 Antineoplastic chemotherapy induced pancytopenia: Secondary | ICD-10-CM | POA: Insufficient documentation

## 2019-09-19 DIAGNOSIS — R0609 Other forms of dyspnea: Secondary | ICD-10-CM | POA: Diagnosis not present

## 2019-09-19 DIAGNOSIS — I10 Essential (primary) hypertension: Secondary | ICD-10-CM | POA: Insufficient documentation

## 2019-09-19 DIAGNOSIS — M810 Age-related osteoporosis without current pathological fracture: Secondary | ICD-10-CM | POA: Diagnosis not present

## 2019-09-19 DIAGNOSIS — R69 Illness, unspecified: Secondary | ICD-10-CM | POA: Diagnosis not present

## 2019-09-19 DIAGNOSIS — Z510 Encounter for antineoplastic radiation therapy: Secondary | ICD-10-CM | POA: Diagnosis not present

## 2019-09-19 LAB — CBC WITH DIFFERENTIAL (CANCER CENTER ONLY)
Abs Immature Granulocytes: 0.01 10*3/uL (ref 0.00–0.07)
Basophils Absolute: 0 10*3/uL (ref 0.0–0.1)
Basophils Relative: 1 %
Eosinophils Absolute: 0.1 10*3/uL (ref 0.0–0.5)
Eosinophils Relative: 7 %
HCT: 28.7 % — ABNORMAL LOW (ref 36.0–46.0)
Hemoglobin: 9.4 g/dL — ABNORMAL LOW (ref 12.0–15.0)
Immature Granulocytes: 1 %
Lymphocytes Relative: 4 %
Lymphs Abs: 0.1 10*3/uL — ABNORMAL LOW (ref 0.7–4.0)
MCH: 29.8 pg (ref 26.0–34.0)
MCHC: 32.8 g/dL (ref 30.0–36.0)
MCV: 91.1 fL (ref 80.0–100.0)
Monocytes Absolute: 0.1 10*3/uL (ref 0.1–1.0)
Monocytes Relative: 9 %
Neutro Abs: 1.2 10*3/uL — ABNORMAL LOW (ref 1.7–7.7)
Neutrophils Relative %: 78 %
Platelet Count: 179 10*3/uL (ref 150–400)
RBC: 3.15 MIL/uL — ABNORMAL LOW (ref 3.87–5.11)
RDW: 14.6 % (ref 11.5–15.5)
WBC Count: 1.5 10*3/uL — ABNORMAL LOW (ref 4.0–10.5)
nRBC: 0 % (ref 0.0–0.2)

## 2019-09-19 LAB — CMP (CANCER CENTER ONLY)
ALT: 8 U/L (ref 0–44)
AST: 17 U/L (ref 15–41)
Albumin: 3.3 g/dL — ABNORMAL LOW (ref 3.5–5.0)
Alkaline Phosphatase: 55 U/L (ref 38–126)
Anion gap: 10 (ref 5–15)
BUN: 13 mg/dL (ref 8–23)
CO2: 24 mmol/L (ref 22–32)
Calcium: 9.2 mg/dL (ref 8.9–10.3)
Chloride: 108 mmol/L (ref 98–111)
Creatinine: 0.82 mg/dL (ref 0.44–1.00)
GFR, Est AFR Am: >60 mL/min (ref >60)
GFR, Estimated: >60 mL/min (ref >60)
Glucose, Bld: 109 mg/dL — ABNORMAL HIGH (ref 70–99)
Potassium: 3.2 mmol/L — ABNORMAL LOW (ref 3.5–5.1)
Sodium: 142 mmol/L (ref 135–145)
Total Bilirubin: 0.5 mg/dL (ref 0.3–1.2)
Total Protein: 7.9 g/dL (ref 6.5–8.1)

## 2019-09-19 MED ORDER — DIPHENOXYLATE-ATROPINE 2.5-0.025 MG PO TABS: 1.0000 | ORAL_TABLET | Freq: Four times a day (QID) | ORAL | 1 refills | Status: DC | PRN

## 2019-09-19 MED ORDER — POTASSIUM CHLORIDE CRYS ER 20 MEQ PO TBCR: 20.0000 meq | EXTENDED_RELEASE_TABLET | Freq: Two times a day (BID) | ORAL | 0 refills | Status: DC

## 2019-09-19 MED ORDER — OXYCODONE HCL 5 MG PO TABS: 5.0000 mg | ORAL_TABLET | Freq: Four times a day (QID) | ORAL | 0 refills | Status: DC | PRN

## 2019-09-19 NOTE — Telephone Encounter (Signed)
-----   Message from Alla Feeling, NP sent at 09/19/2019  3:04 PM EDT ----- Please let her know K was low today 3.2, probably because of her diarrhea. I called in potassium 1 tab twice daily along with lomotil and pain med refill. Have her start with one tonight then BID tomorrow.   Thanks, Regan Rakers

## 2019-09-19 NOTE — Telephone Encounter (Signed)
Per Cira Rue, NP called to make pt aware of low potassium 3.2, today, that was possibly related to her diarrhea. Prescription for potassium, lomotil and pain med refill was sent. Advised pt to start one tonight and then BID starting tomorrow. Pt verbalized understanding

## 2019-09-20 ENCOUNTER — Ambulatory Visit: Payer: Medicare HMO

## 2019-09-20 ENCOUNTER — Telehealth: Payer: Self-pay | Admitting: Nurse Practitioner

## 2019-09-20 NOTE — Telephone Encounter (Signed)
Scheduled appt per 5/3 los.  Spoke with pt and she is aware of her appt date and time.

## 2019-09-21 ENCOUNTER — Ambulatory Visit
Admission: RE | Admit: 2019-09-21 | Discharge: 2019-09-21 | Disposition: A | Discharge status: Home / Self Care | Payer: Medicare HMO | Source: Ambulatory Visit | Attending: Radiation Oncology | Admitting: Radiation Oncology

## 2019-09-21 ENCOUNTER — Other Ambulatory Visit: Payer: Self-pay

## 2019-09-21 DIAGNOSIS — C210 Malignant neoplasm of anus, unspecified: Secondary | ICD-10-CM | POA: Diagnosis not present

## 2019-09-21 DIAGNOSIS — C20 Malignant neoplasm of rectum: Secondary | ICD-10-CM | POA: Diagnosis not present

## 2019-09-21 DIAGNOSIS — Z510 Encounter for antineoplastic radiation therapy: Secondary | ICD-10-CM | POA: Diagnosis not present

## 2019-09-22 ENCOUNTER — Ambulatory Visit
Admission: RE | Admit: 2019-09-22 | Discharge: 2019-09-22 | Disposition: A | Discharge status: Home / Self Care | Payer: Medicare HMO | Source: Ambulatory Visit | Attending: Radiation Oncology | Admitting: Radiation Oncology

## 2019-09-22 ENCOUNTER — Other Ambulatory Visit: Payer: Self-pay

## 2019-09-22 ENCOUNTER — Ambulatory Visit: Payer: Medicare HMO

## 2019-09-22 DIAGNOSIS — Z510 Encounter for antineoplastic radiation therapy: Secondary | ICD-10-CM | POA: Diagnosis not present

## 2019-09-22 DIAGNOSIS — C20 Malignant neoplasm of rectum: Secondary | ICD-10-CM | POA: Diagnosis not present

## 2019-09-22 DIAGNOSIS — C210 Malignant neoplasm of anus, unspecified: Secondary | ICD-10-CM | POA: Diagnosis not present

## 2019-09-23 ENCOUNTER — Inpatient Hospital Stay: Payer: Medicare HMO

## 2019-09-23 ENCOUNTER — Ambulatory Visit
Admission: RE | Admit: 2019-09-23 | Discharge: 2019-09-23 | Disposition: A | Discharge status: Home / Self Care | Payer: Medicare HMO | Source: Ambulatory Visit | Attending: Radiation Oncology | Admitting: Radiation Oncology

## 2019-09-23 ENCOUNTER — Ambulatory Visit: Payer: Medicare HMO

## 2019-09-23 DIAGNOSIS — R300 Dysuria: Secondary | ICD-10-CM | POA: Diagnosis not present

## 2019-09-23 DIAGNOSIS — C210 Malignant neoplasm of anus, unspecified: Secondary | ICD-10-CM | POA: Diagnosis not present

## 2019-09-23 DIAGNOSIS — I10 Essential (primary) hypertension: Secondary | ICD-10-CM | POA: Diagnosis not present

## 2019-09-23 DIAGNOSIS — R5383 Other fatigue: Secondary | ICD-10-CM | POA: Diagnosis not present

## 2019-09-23 DIAGNOSIS — T451X5A Adverse effect of antineoplastic and immunosuppressive drugs, initial encounter: Secondary | ICD-10-CM | POA: Diagnosis not present

## 2019-09-23 DIAGNOSIS — K6289 Other specified diseases of anus and rectum: Secondary | ICD-10-CM | POA: Diagnosis not present

## 2019-09-23 DIAGNOSIS — R69 Illness, unspecified: Secondary | ICD-10-CM | POA: Diagnosis not present

## 2019-09-23 DIAGNOSIS — C20 Malignant neoplasm of rectum: Secondary | ICD-10-CM | POA: Diagnosis not present

## 2019-09-23 DIAGNOSIS — Z510 Encounter for antineoplastic radiation therapy: Secondary | ICD-10-CM | POA: Diagnosis not present

## 2019-09-23 DIAGNOSIS — R0609 Other forms of dyspnea: Secondary | ICD-10-CM | POA: Diagnosis not present

## 2019-09-23 DIAGNOSIS — D61810 Antineoplastic chemotherapy induced pancytopenia: Secondary | ICD-10-CM | POA: Diagnosis not present

## 2019-09-23 LAB — CBC WITH DIFFERENTIAL (CANCER CENTER ONLY)
Abs Immature Granulocytes: 0 10*3/uL (ref 0.00–0.07)
Basophils Absolute: 0 10*3/uL (ref 0.0–0.1)
Basophils Relative: 1 %
Eosinophils Absolute: 0.1 10*3/uL (ref 0.0–0.5)
Eosinophils Relative: 4 %
HCT: 28.2 % — ABNORMAL LOW (ref 36.0–46.0)
Hemoglobin: 9.5 g/dL — ABNORMAL LOW (ref 12.0–15.0)
Immature Granulocytes: 0 %
Lymphocytes Relative: 3 %
Lymphs Abs: 0.1 10*3/uL — ABNORMAL LOW (ref 0.7–4.0)
MCH: 30.4 pg (ref 26.0–34.0)
MCHC: 33.7 g/dL (ref 30.0–36.0)
MCV: 90.4 fL (ref 80.0–100.0)
Monocytes Absolute: 0.3 10*3/uL (ref 0.1–1.0)
Monocytes Relative: 14 %
Neutro Abs: 1.5 10*3/uL — ABNORMAL LOW (ref 1.7–7.7)
Neutrophils Relative %: 78 %
Platelet Count: 131 10*3/uL — ABNORMAL LOW (ref 150–400)
RBC: 3.12 MIL/uL — ABNORMAL LOW (ref 3.87–5.11)
RDW: 14.9 % (ref 11.5–15.5)
WBC Count: 1.9 10*3/uL — ABNORMAL LOW (ref 4.0–10.5)
nRBC: 0 % (ref 0.0–0.2)

## 2019-09-23 LAB — CMP (CANCER CENTER ONLY)
ALT: 6 U/L (ref 0–44)
AST: 17 U/L (ref 15–41)
Albumin: 3.3 g/dL — ABNORMAL LOW (ref 3.5–5.0)
Alkaline Phosphatase: 63 U/L (ref 38–126)
Anion gap: 12 (ref 5–15)
BUN: 10 mg/dL (ref 8–23)
CO2: 23 mmol/L (ref 22–32)
Calcium: 9 mg/dL (ref 8.9–10.3)
Chloride: 107 mmol/L (ref 98–111)
Creatinine: 0.94 mg/dL (ref 0.44–1.00)
GFR, Est AFR Am: >60 mL/min (ref >60)
GFR, Estimated: >60 mL/min (ref >60)
Glucose, Bld: 114 mg/dL — ABNORMAL HIGH (ref 70–99)
Potassium: 3.2 mmol/L — ABNORMAL LOW (ref 3.5–5.1)
Sodium: 142 mmol/L (ref 135–145)
Total Bilirubin: 0.4 mg/dL (ref 0.3–1.2)
Total Protein: 7.8 g/dL (ref 6.5–8.1)

## 2019-09-23 NOTE — Progress Notes (Signed)
Vanessa Beard   Telephone:([phone]) [phone] Fax:(336) [phone]   Clinic Follow up Note   Patient Care Team: Seward Carol, MD as PCP - General (Internal Medicine) Carlyle Basques, MD as PCP - Infectious Diseases (Infectious Diseases) Binnie Rail, DC as Referring Physician (Chiropractic Medicine) Jonnie Finner, RN as Oncology Nurse Navigator  Date of Service:  09/26/2019  CHIEF COMPLAINT: F/u ofanal cancer  SUMMARY OF ONCOLOGIC HISTORY: Oncology History Overview Note  Cancer Staging Anal cancer ([place]) Staging form: Anus, AJCC 8th Edition - Clinical: Stage IV (cT2, cN0, cM1) - Signed by Truitt Merle, MD on 08/12/2019    Anal cancer ([place])  07/19/2019 Procedure   Colonoscopy by Dr Havery Moros 07/19/19  IMPRESSION - Ulcer noted at the anal canal in posterior midline canal that extends into the distal rectum on digital rectal exam, nodular and somewhat hard to palpation. Endoscopic images show nodular tissue in the area, concerning for malignant ulcer. - The examined portion of the ileum was normal. - One 3 mm polyp at the hepatic flexure, removed with a cold snare. Resected and retrieved. - One 5 mm polyp in the transverse colon, removed with a cold snare. Resected and retrieved. - Diverticulosis in the ascending colon. - The examination was otherwise normal.   07/19/2019 Initial Biopsy   Diagnosis 07/19/19 1. Transverse Colon Polyp, hepatic flexure (2) - TUBULAR ADENOMA (1 OF 3 FRAGMENTS) - BENIGN COLONIC MUCOSA (2 OF 3 FRAGMENTS) - NO HIGH GRADE DYSPLASIA OR MALIGNANCY IDENTIFIED 2. Rectum, biopsy, distal rectal anal canal - SQUAMOUS CELL CARCINOMA - SEE COMMENT   07/26/2019 Initial Diagnosis   Anal cancer ([place])   07/26/2019 Cancer Staging   Staging form: Anus, AJCC 8th Edition - Clinical: Stage IV (cT2, cN1c, cM1) - Signed by Truitt Merle, MD on 08/12/2019   08/04/2019 PET scan   IMPRESSION: Hypermetabolic anal soft tissue mass, consistent with known primary anal  carcinoma.   Sub-cm hypermetabolic lymph nodes in posterior perirectal space, right inguinal region, right iliac chain, and gastrohepatic ligament, suspicious for metastatic disease.   Small hypermetabolic left axillary lymph nodes. This would be unusual location for metastatic anal carcinoma. Recommend clinical correlation for possibility the patient has had recent COVID vaccination in the left arm, which could explain this finding.   Multifocal airspace disease in both lower lungs with marked hypermetabolic activity. This favors infectious or inflammatory etiology, and is not typical for pulmonary metastases. Short-term follow-up by chest CT is recommended.   08/15/2019 -  Radiation Therapy   Concurrent ChemoRT with Dr. Lisbeth Renshaw starting 08/15/19. Plan to complete on 10/06/19   08/15/2019 - 09/12/2019 Chemotherapy   Concurrent ChemoRT with Mitomycin and 5FU on week 1 and 5 starting 08/15/19 with week 5 dose on 09/12/19.       CURRENT THERAPY:  Concurrent chemoRT with Mitomycin and 5FU on week 1 and 5 for 6 weeks starting 08/15/19.Plan to complete on 10/06/19  INTERVAL HISTORY:  Vanessa Beard is here for a follow up. She presents to the clinic alone. She notes more rectal and buttock irration and pain. She feels she is can manage this with oxycodone 5mg  TID and ibuprofen. She notes she has not been going in to work given her fatigue. 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 She plans to go back to school 11/08/19. She can still take care of herself. She notes her NP Lacie saw mild skin breakdown at anus. She notes she had 2 BM yesterday. She notes stomach cramps with BMs.  Her weight is mostly stable.     REVIEW OF SYSTEMS:   Constitutional: Denies fevers, chills or abnormal weight loss (+) Fatigue  Eyes: Denies blurriness of vision Ears, nose, mouth, throat, and face: Denies mucositis or sore throat Respiratory: Denies cough, dyspnea or wheezes Cardiovascular: Denies palpitation,  chest discomfort or lower extremity swelling Gastrointestinal:  Denies nausea, heartburn or change in bowel habits (+) Buttock and rectal pain and skin irritation (+) Bowel cramps Skin: Denies abnormal skin rashes Lymphatics: Denies new lymphadenopathy or easy bruising Neurological:Denies numbness, tingling or new weaknesses Behavioral/Psych: Mood is stable, no new changes  All other systems were reviewed with the patient and are negative.  MEDICAL HISTORY:  Past Medical History:  Diagnosis Date  . Allergic rhinitis   . Biliary colic   . Gall bladder disease \  . Hepatitis C   . Herpes   . HIV infection ([place])   . Hyperparathyroidism ([place])   . Hypertension   . Insomnia   . Osteoporosis   . Pneumonia    2010    SURGICAL HISTORY: Past Surgical History:  Procedure Laterality Date  . CHOLECYSTECTOMY N/A 01/09/2015   Procedure: LAPAROSCOPIC CHOLECYSTECTOMY;  Surgeon: [name], MD;  Location: [facility];  Service: General;  Laterality: N/A;  . ECTOPIC PREGNANCY SURGERY    . ESOPHAGOGASTRODUODENOSCOPY (EGD) WITH PROPOFOL N/A 09/08/2019   Procedure: ESOPHAGOGASTRODUODENOSCOPY (EGD) WITH PROPOFOL;  Surgeon: Milus Banister, MD;  Location: [facility];  Service: Endoscopy;  Laterality: N/A;  . IR FLUORO GUIDE CV LINE LEFT  08/15/2019  . ORIF TIBIA PLATEAU Right 02/18/2017   Procedure: OPEN REDUCTION INTERNAL FIXATION (ORIF) RIGHT TIBIAL PLATEAU;  Surgeon: Leandrew Koyanagi, MD;  Location: [facility];  Service: Orthopedics;  Laterality: Right;  . SHOULDER SURGERY Right 12/2014  . UPPER ESOPHAGEAL ENDOSCOPIC ULTRASOUND (EUS) N/A 09/08/2019   Procedure: UPPER ESOPHAGEAL ENDOSCOPIC ULTRASOUND (EUS);  Surgeon: Milus Banister, MD;  Location: [facility];  Service: Endoscopy;  Laterality: N/A;    I have reviewed the social history and family history with the patient and they are unchanged from previous note.  ALLERGIES:  is allergic to dapsone; retrovir [zidovudine]; and  sulfamethoxazole-trimethoprim.  MEDICATIONS:  Current Outpatient Medications  Medication Sig Dispense Refill  . acetaminophen (TYLENOL) 500 MG tablet Take 1,000 mg by mouth every 6 (six) hours as needed for moderate pain or headache.    . AMBULATORY NON FORMULARY MEDICATION Nitroglycerine ointment 0.125 %  Apply a pea sized amount internally four times daily. 30 g 1  . amLODipine (NORVASC) 10 MG tablet Take 1 tablet (10 mg total) by mouth daily. 90 tablet 3  . diphenoxylate-atropine (LOMOTIL) 2.5-0.025 MG tablet Take 1-2 tablets by mouth 4 (four) times daily as needed for diarrhea or loose stools. 30 tablet 1  . fluconazole (DIFLUCAN) 150 MG tablet Take 1 tablet (150 mg total) by mouth daily. 2 tablet 0  . ibuprofen (ADVIL) 800 MG tablet TAKE 1 TABLET BY MOUTH EVERY 8 HOURS AS NEEDED 90 tablet 0  . ondansetron (ZOFRAN) 8 MG tablet Take 1 tablet (8 mg total) by mouth 2 (two) times daily as needed (Nausea or vomiting). 30 tablet 1  . oxyCODONE (OXY IR/ROXICODONE) 5 MG immediate release tablet Take 1-2 tablets (5-10 mg total) by mouth every 6 (six) hours as needed for severe pain. 60 tablet 0  . potassium chloride SA (KLOR-CON) 20 MEQ tablet Take 1 tablet (20 mEq total) by mouth 2 (two) times daily. 30 tablet 0  . prochlorperazine (COMPAZINE)  10 MG tablet Take 1 tablet (10 mg total) by mouth every 6 (six) hours as needed (Nausea or vomiting). 30 tablet 1  . TRIUMEQ 600-50-300 MG tablet TAKE 1 TABLET BY MOUTH DAILY (Patient taking differently: Take 1 tablet by mouth daily. ) 30 tablet 3  . zolpidem (AMBIEN) 10 MG tablet Take 1 tablet (10 mg total) by mouth at bedtime as needed. (Patient taking differently: Take 10 mg by mouth at bedtime as needed for sleep. ) 30 tablet 1   No current facility-administered medications for this visit.    PHYSICAL EXAMINATION: ECOG PERFORMANCE STATUS: 1 - Symptomatic but completely ambulatory  Vitals:   09/26/19 1332  BP: 110/78  Pulse: 87  Resp: 18  Temp:  98.7 F (37.1 C)  SpO2: 98%   Filed Weights   09/26/19 1332  Weight: 163 lb (73.9 kg)    GENERAL:alert, no distress and comfortable SKIN: skin color, texture, turgor are normal, no rashes or significant lesions EYES: normal, Conjunctiva are pink and non-injected, sclera clear  NECK: supple, thyroid normal size, non-tender, without nodularity LYMPH:  no palpable lymphadenopathy in the cervical, axillary  LUNGS: clear to auscultation and percussion with normal breathing effort HEART: regular rate & rhythm and no murmurs and no lower extremity edema ABDOMEN:abdomen soft, non-tender and normal bowel sounds Musculoskeletal:no cyanosis of digits and no clubbing  NEURO: alert & oriented x 3 with fluent speech, no focal motor/sensory deficits BREAST: (+) 2-3 left perianal ulcers, measuring 3-4cm, and skin erythema. No palpable mass, nodules or adenopathy bilaterally. Breast exam benign.   LABORATORY DATA:  I have reviewed the data as listed CBC Latest Ref Rng & Units 09/26/2019 09/23/2019 09/19/2019  WBC 4.0 - 10.5 K/uL 2.3(L) 1.9(L) 1.5(L)  Hemoglobin 12.0 - 15.0 g/dL 9.4(L) 9.5(L) 9.4(L)  Hematocrit 36.0 - 46.0 % 27.9(L) 28.2(L) 28.7(L)  Platelets 150 - 400 K/uL 67(L) 131(L) 179     CMP Latest Ref Rng & Units 09/26/2019 09/23/2019 09/19/2019  Glucose 70 - 99 mg/dL 109(H) 114(H) 109(H)  BUN 8 - 23 mg/dL 14 10 13   Creatinine 0.44 - 1.00 mg/dL 0.86 0.94 0.82  Sodium 135 - 145 mmol/L 143 142 142  Potassium 3.5 - 5.1 mmol/L 3.4(L) 3.2(L) 3.2(L)  Chloride 98 - 111 mmol/L 107 107 108  CO2 22 - 32 mmol/L 24 23 24   Calcium 8.9 - 10.3 mg/dL 9.2 9.0 9.2  Total Protein 6.5 - 8.1 g/dL 7.6 7.8 7.9  Total Bilirubin 0.3 - 1.2 mg/dL 0.5 0.4 0.5  Alkaline Phos 38 - 126 U/L 61 63 55  AST 15 - 41 U/L 20 17 17   ALT 0 - 44 U/L 8 6 8       RADIOGRAPHIC STUDIES: I have personally reviewed the radiological images as listed and agreed with the findings in the report. No results found.   ASSESSMENT & PLAN:    Vanessa Beard is a 69 y.o. 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 Her biopsy results show Squamous Cell Carcinoma.This is consistent with anal cancer. She has HIV, which is a high risk for anal cancer. -Her PET from 08/04/19 showedhypermetabolicanal mass, hypermetabolicLNsin posterior perirectal space, right inguinal region, right iliac chain and gastrohepatic ligament suspicious for metastasis. Scan also showed hypermetabolic left axillary LNs, which is unusual metastatic pattern. Shedenies recent injury or injections  in the left arm. -I recommend biopsy of axillary LNor EUS gastrohepatic nodetorule outdistant metastasis. She underwent EUS first on 09/08/19 with Dr Ardis Hughs. Her LN was not found so no biopsy was done.  -Althoughmetastatic disease is not ruled out at this point,Dr. Lisbeth Renshaw is able to cover thegastrohepatic lymph node in the radiation field.Irecommendedconcurrent ChemoRT for 6 weeks withMitomycin and 5FU on week 1 and 5, which is the standard therapyfordefinitive anal cancer therapy.She started on3/29/21.She will have PICC line placed on first dayof chemo on week 1 and 5. -I plan to repeat CT chest to evaluate her left axillary lymph node after she completes chemoradiation. -S/p 5 weeks treatment she has fatigue and mild perianal skin ulcers and rectal/buttock pain increase. Pain is controlled on Oxycodone and ibuprofen. I encouraged her to keep area clean and dry.  -Labs reviewed, WBC 2.3, Hg 9.4, plt 63K, K 3.4, BG 109, albumin 3.3. Continue RT  -Will repeat labs in 3 days to monitor platelets -F/u next week   2. Hep C, HIV -She underwentsuccessfulHep C treatment 2 years ago [id number]) -Her HIV was contracted through  sex. Her HIV is well controlled. She will continue to f/u ID Dr Graylon Good.Last CD4 count was 386 on 02/07/2019.   3. Comorbidities: HTN, Hyperparathyroidism, Osteoporosis -Continue medications and f/u with PCP.   4. Social Support  -She is divorced, lives alone and has 2 adult children who live in [place]  -She is getting bachelors degree in Criminal Justice through Online schooling, she plans to complete in 10/2019 but may postpone her final classes.  -She is currently working at [employer] as [employer] and works 8-9 hours every day. She notes her work is flexible to take time off if needed.   PLAN: -Continue Radiation  -Lab on 5/13 to monitor her thrombocytopenia -lab and F/u next week    No problem-specific Assessment & Plan notes found for this encounter.   No orders of the defined types were placed in this encounter.  All questions were answered. The patient knows to call the clinic with any problems, questions or concerns. No barriers to learning was detected. The total time spent in the appointment was 25 minutes.     Truitt Merle, MD 09/26/2019   I, Joslyn Devon, am acting as scribe for Truitt Merle, MD.   I have reviewed the above documentation for accuracy and completeness, and I agree with the above.

## 2019-09-26 ENCOUNTER — Inpatient Hospital Stay: Payer: Medicare HMO

## 2019-09-26 ENCOUNTER — Encounter: Payer: Self-pay | Admitting: Hematology

## 2019-09-26 ENCOUNTER — Ambulatory Visit
Admission: RE | Admit: 2019-09-26 | Discharge: 2019-09-26 | Disposition: A | Discharge status: Home / Self Care | Payer: Medicare HMO | Source: Ambulatory Visit | Attending: Radiation Oncology | Admitting: Radiation Oncology

## 2019-09-26 ENCOUNTER — Ambulatory Visit: Payer: Medicare HMO

## 2019-09-26 ENCOUNTER — Other Ambulatory Visit: Payer: Self-pay

## 2019-09-26 ENCOUNTER — Inpatient Hospital Stay (HOSPITAL_BASED_OUTPATIENT_CLINIC_OR_DEPARTMENT_OTHER): Payer: Medicare HMO | Admitting: Hematology

## 2019-09-26 VITALS — BP 110/78 | HR 87 | Temp 98.7°F | Resp 18 | Ht 60.0 in | Wt 163.0 lb

## 2019-09-26 DIAGNOSIS — R5383 Other fatigue: Secondary | ICD-10-CM | POA: Diagnosis not present

## 2019-09-26 DIAGNOSIS — D61810 Antineoplastic chemotherapy induced pancytopenia: Secondary | ICD-10-CM | POA: Diagnosis not present

## 2019-09-26 DIAGNOSIS — T451X5A Adverse effect of antineoplastic and immunosuppressive drugs, initial encounter: Secondary | ICD-10-CM | POA: Diagnosis not present

## 2019-09-26 DIAGNOSIS — C210 Malignant neoplasm of anus, unspecified: Secondary | ICD-10-CM | POA: Diagnosis not present

## 2019-09-26 DIAGNOSIS — R300 Dysuria: Secondary | ICD-10-CM | POA: Diagnosis not present

## 2019-09-26 DIAGNOSIS — R0609 Other forms of dyspnea: Secondary | ICD-10-CM | POA: Diagnosis not present

## 2019-09-26 DIAGNOSIS — R69 Illness, unspecified: Secondary | ICD-10-CM | POA: Diagnosis not present

## 2019-09-26 DIAGNOSIS — C20 Malignant neoplasm of rectum: Secondary | ICD-10-CM | POA: Diagnosis not present

## 2019-09-26 DIAGNOSIS — Z510 Encounter for antineoplastic radiation therapy: Secondary | ICD-10-CM | POA: Diagnosis not present

## 2019-09-26 DIAGNOSIS — K6289 Other specified diseases of anus and rectum: Secondary | ICD-10-CM | POA: Diagnosis not present

## 2019-09-26 DIAGNOSIS — I10 Essential (primary) hypertension: Secondary | ICD-10-CM | POA: Diagnosis not present

## 2019-09-26 LAB — CBC WITH DIFFERENTIAL (CANCER CENTER ONLY)
Abs Immature Granulocytes: 0.01 10*3/uL (ref 0.00–0.07)
Basophils Absolute: 0 10*3/uL (ref 0.0–0.1)
Basophils Relative: 0 %
Eosinophils Absolute: 0 10*3/uL (ref 0.0–0.5)
Eosinophils Relative: 2 %
HCT: 27.9 % — ABNORMAL LOW (ref 36.0–46.0)
Hemoglobin: 9.4 g/dL — ABNORMAL LOW (ref 12.0–15.0)
Immature Granulocytes: 0 %
Lymphocytes Relative: 2 %
Lymphs Abs: 0.1 10*3/uL — ABNORMAL LOW (ref 0.7–4.0)
MCH: 31.1 pg (ref 26.0–34.0)
MCHC: 33.7 g/dL (ref 30.0–36.0)
MCV: 92.4 fL (ref 80.0–100.0)
Monocytes Absolute: 0.5 10*3/uL (ref 0.1–1.0)
Monocytes Relative: 21 %
Neutro Abs: 1.7 10*3/uL (ref 1.7–7.7)
Neutrophils Relative %: 75 %
Platelet Count: 67 10*3/uL — ABNORMAL LOW (ref 150–400)
RBC: 3.02 MIL/uL — ABNORMAL LOW (ref 3.87–5.11)
RDW: 15.5 % (ref 11.5–15.5)
WBC Count: 2.3 10*3/uL — ABNORMAL LOW (ref 4.0–10.5)
nRBC: 0 % (ref 0.0–0.2)

## 2019-09-26 LAB — CMP (CANCER CENTER ONLY)
ALT: 8 U/L (ref 0–44)
AST: 20 U/L (ref 15–41)
Albumin: 3.3 g/dL — ABNORMAL LOW (ref 3.5–5.0)
Alkaline Phosphatase: 61 U/L (ref 38–126)
Anion gap: 12 (ref 5–15)
BUN: 14 mg/dL (ref 8–23)
CO2: 24 mmol/L (ref 22–32)
Calcium: 9.2 mg/dL (ref 8.9–10.3)
Chloride: 107 mmol/L (ref 98–111)
Creatinine: 0.86 mg/dL (ref 0.44–1.00)
GFR, Est AFR Am: >60 mL/min (ref >60)
GFR, Estimated: >60 mL/min (ref >60)
Glucose, Bld: 109 mg/dL — ABNORMAL HIGH (ref 70–99)
Potassium: 3.4 mmol/L — ABNORMAL LOW (ref 3.5–5.1)
Sodium: 143 mmol/L (ref 135–145)
Total Bilirubin: 0.5 mg/dL (ref 0.3–1.2)
Total Protein: 7.6 g/dL (ref 6.5–8.1)

## 2019-09-27 ENCOUNTER — Telehealth: Payer: Self-pay | Admitting: Hematology

## 2019-09-27 ENCOUNTER — Ambulatory Visit: Payer: Medicare HMO

## 2019-09-27 ENCOUNTER — Other Ambulatory Visit: Payer: Self-pay

## 2019-09-27 ENCOUNTER — Ambulatory Visit
Admission: RE | Admit: 2019-09-27 | Discharge: 2019-09-27 | Disposition: A | Discharge status: Home / Self Care | Payer: Medicare HMO | Source: Ambulatory Visit | Attending: Radiation Oncology | Admitting: Radiation Oncology

## 2019-09-27 DIAGNOSIS — Z510 Encounter for antineoplastic radiation therapy: Secondary | ICD-10-CM | POA: Diagnosis not present

## 2019-09-27 DIAGNOSIS — C20 Malignant neoplasm of rectum: Secondary | ICD-10-CM | POA: Diagnosis not present

## 2019-09-27 DIAGNOSIS — C210 Malignant neoplasm of anus, unspecified: Secondary | ICD-10-CM | POA: Diagnosis not present

## 2019-09-27 NOTE — Telephone Encounter (Signed)
Scheduled appt per 5/10 los.  Spoke with pt and they are aware of the appt date and time.

## 2019-09-28 ENCOUNTER — Encounter: Payer: Self-pay | Admitting: Physical Therapy

## 2019-09-28 ENCOUNTER — Ambulatory Visit: Payer: Medicare HMO

## 2019-09-28 ENCOUNTER — Ambulatory Visit: Payer: Medicare HMO | Admitting: Physical Therapy

## 2019-09-28 ENCOUNTER — Ambulatory Visit
Admission: RE | Admit: 2019-09-28 | Discharge: 2019-09-28 | Disposition: A | Discharge status: Home / Self Care | Payer: Medicare HMO | Source: Ambulatory Visit | Attending: Radiation Oncology | Admitting: Radiation Oncology

## 2019-09-28 ENCOUNTER — Other Ambulatory Visit: Payer: Self-pay

## 2019-09-28 ENCOUNTER — Ambulatory Visit: Payer: Medicare HMO | Attending: Hematology | Admitting: Physical Therapy

## 2019-09-28 DIAGNOSIS — M6281 Muscle weakness (generalized): Secondary | ICD-10-CM | POA: Insufficient documentation

## 2019-09-28 DIAGNOSIS — Z510 Encounter for antineoplastic radiation therapy: Secondary | ICD-10-CM | POA: Diagnosis not present

## 2019-09-28 DIAGNOSIS — C210 Malignant neoplasm of anus, unspecified: Secondary | ICD-10-CM

## 2019-09-28 DIAGNOSIS — R278 Other lack of coordination: Secondary | ICD-10-CM | POA: Diagnosis not present

## 2019-09-28 DIAGNOSIS — C20 Malignant neoplasm of rectum: Secondary | ICD-10-CM | POA: Diagnosis not present

## 2019-09-28 NOTE — Patient Instructions (Addendum)
Moisturizers . They are used in the vagina to hydrate the mucous membrane that make up the vaginal canal. . Designed to keep a more normal acid balance (ph) . Once placed in the vagina, it will last between two to three days.  . Use 2-3 times per week at bedtime  . Ingredients to avoid is glycerin and fragrance, can increase chance of infection . Should not be used just before sex due to causing irritation . Most are gels administered either in a tampon-shaped applicator or as a vaginal suppository. They are non-hormonal.   Types of Moisturizers  . Vitamin E vaginal suppositories- Whole foods, Amazon . Moist Again . Coconut oil- can break down condoms . Julva- (Do no use if on Tamoxifen) amazon . Yes moisturizer- amazon . NeuEve Silk , NeuEve Silver for menopausal or over 65 (if have severe vaginal atrophy or cancer treatments use NeuEve Silk for  1 month than move to [employer])- [employer], MapleFlower.dk . Olive and Bee intimate cream- www.oliveandbee.com.au . Mae vaginal [place] . Aloe .    Creams to use externally on the Vulva area  Albertson's (good for for cancer patients that had radiation to the area)- [place] or [employer].FlyingBasics.com.br  V-magic cream - amazon  Julva-amazon  Vital "V Wild Yam salve ( help moisturize and help with thinning vulvar area, does have [place] by Irwin Brakeman labial moisturizer ([place],   Coconut or olive oil  aloe   Things to avoid in the vaginal area . Do not use things to irritate the vulvar area . No lotions just specialized creams for the vulva area- Neogyn, V-magic, No soaps; can use Aveeno or Calendula cleanser if needed. Must be gentle . No deodorants . No douches . Good to sleep without underwear to let the vaginal area to air out . No scrubbing: spread the lips to let warm water rinse over labias and pat dry   PROTOCOL FOR VAGINAL  DILATORS   1. Wash dilator with soap and water prior to insertion.    2. Lay on your back reclined. Knees are to be up and apart while on your bed or in the bathtub with warm water.   3. Lubricate the end of the dilator with a water-soluble lubricant.  4. Separate the labia.   5. Tense the pelvic floor muscles than relax; while relaxing, slide lubricated dilator( round side of dilator) into the vagina.  Insert toward the direction of your spine. Dilator should feel snug and no pain more than 3/10.   6. Tense muscles again while holding the dilator so it does not get pushed out; relax and slide it in a little further.   7. Try blowing out as if filling a balloon; this may relax the muscles and allow penetration.  Repeat blowing out to insert dilator further.  8. Keep dilator in for 10 minutes if tolerate, with the pelvic floor muscles relaxed to further stretch the canal.   9. Never force the dilator into the canal. 10. Once the dilator is comfortable start to move in and out, side to side, move your hips in different directions  11. 3-4 times per week for 2 years 12. Progression of dilator.  a. When you are able to place dilator into vaginal canal and feel no pain or able to move without difficulty you are ready for the next size.  b. Before you go to the next  size start with the original size for 2 minutes then use the next size up for 5 minutes. c. When the next size up is easy to use, do not have to start with the smaller size.    Guided Meditation for Pelvic Floor Relaxation  [employer]   How to Use Vaginal Dilators - Intimate Rose    Access Code: [email or web address] URL: [email or web address] Date: 09/28/2019 Prepared by: Earlie Counts  Exercises Supine Butterfly Groin Stretch - 1 x daily - 7 x weekly - 1 sets - 1 reps - 1 min hold Supine Double Knee to Chest - 1 x daily - 7 x weekly - 1 sets - 1 reps - 30 sec hold Supine Piriformis Stretch - 1 x daily - 7 x  weekly - 1 sets - 2 reps - 30 sec hold Supine Hamstring Stretch - 1 x daily - 7 x weekly - 1 sets - 2 reps - 30 sec hold Supine Lower Trunk Rotation - 1 x daily - 7 x weekly - 1 sets - 2 reps - 30 sec hold Cat-Camel - 1 x daily - 7 x weekly - 1 sets - 10 reps Quadruped Leg Lifts - 1 x daily - 7 x weekly - 1 sets - 10 reps Bird Dog - 1 x daily - 7 x weekly - 3 sets - 10 reps Seated Hip Flexor Stretch - 1 x daily - 7 x weekly - 1 sets - 2 reps Standing Lumbar Spine Flexion Stretch Counter - 1 x daily - 7 x weekly - 1 sets - 2 reps - 30 sec hold [facility] [address] [place], [place] [address] Phone # [phone] Fax [phone]

## 2019-09-28 NOTE — Therapy (Addendum)
[facility] [address], [place], [place], [address] Phone: [phone]   Fax:  [phone]  Physical Therapy Treatment  Patient Details  Name: Vanessa Beard MRN: [id number] Date of Birth: 05-19-1951 Referring Provider (PT): Dr. Truitt Merle   Encounter Date: 09/28/2019  PT End of Session - 09/28/19 1022    Visit Number  2    Date for PT Re-Evaluation  11/30/19    Authorization Type  [employer]    PT Start Time  7425    PT Stop Time  1055    PT Time Calculation (min)  40 min    Activity Tolerance  Patient tolerated treatment well    Behavior During Therapy  [facility] for tasks assessed/performed       Past Medical History:  Diagnosis Date  . Allergic rhinitis   . Biliary colic   . Gall bladder disease \  . Hepatitis C   . Herpes   . HIV infection ([place])   . Hyperparathyroidism ([place])   . Hypertension   . Insomnia   . Osteoporosis   . Pneumonia    2010    Past Surgical History:  Procedure Laterality Date  . CHOLECYSTECTOMY N/A 01/09/2015   Procedure: LAPAROSCOPIC CHOLECYSTECTOMY;  Surgeon: [name], MD;  Location: [place];  Service: General;  Laterality: N/A;  . ECTOPIC PREGNANCY SURGERY    . ESOPHAGOGASTRODUODENOSCOPY (EGD) WITH PROPOFOL N/A 09/08/2019   Procedure: ESOPHAGOGASTRODUODENOSCOPY (EGD) WITH PROPOFOL;  Surgeon: Milus Banister, MD;  Location: [facility];  Service: Endoscopy;  Laterality: N/A;  . IR FLUORO GUIDE CV LINE LEFT  08/15/2019  . ORIF TIBIA PLATEAU Right 02/18/2017   Procedure: OPEN REDUCTION INTERNAL FIXATION (ORIF) RIGHT TIBIAL PLATEAU;  Surgeon: Leandrew Koyanagi, MD;  Location: [place] [facility];  Service: Orthopedics;  Laterality: Right;  . SHOULDER SURGERY Right 12/2014  . UPPER ESOPHAGEAL ENDOSCOPIC ULTRASOUND (EUS) N/A 09/08/2019   Procedure: UPPER ESOPHAGEAL ENDOSCOPIC ULTRASOUND (EUS);  Surgeon: Milus Banister, MD;  Location: [facility];  Service: Endoscopy;  Laterality: N/A;     There were no vitals filed for this visit.  Subjective Assessment - 09/28/19 1020    Subjective  I just finished my chemotherapy. I will finish the radiation next week. When lay on left arm feels the numbness or tingling in left arm. I have pain after a bowel movement. Bowels are skinnier. I have the dilators for the vaginal area.    Patient Stated Goals  learn about the dilators    Currently in Pain?  Yes    Pain Score  7     Pain Location  Rectum    Pain Orientation  Mid    Pain Descriptors / Indicators  Shooting    Pain Type  Acute pain    Pain Onset  More than a month ago    Pain Frequency  Intermittent    Aggravating Factors   during a bowel movement    Pain Relieving Factors  no bowel movement    Multiple Pain Sites  No         [facility] PT Assessment - 09/28/19 0001      Assessment   Medical Diagnosis  C21.0 Anal Cancer    Referring Provider (PT)  Dr. Truitt Merle    Onset Date/Surgical Date  07/26/19    Prior Therapy  not for pelvic floor      Precautions   Precautions  Other (comment)    Precaution Comments  cancer  Restrictions   Weight Bearing Restrictions  No      Home Film/video editor residence      Prior Function   Level of Independence  Independent      Cognition   Overall Cognitive Status  Within Functional Limits for tasks assessed      Sensation   Additional Comments  sensation equal except for the right lateral due to a previous surgery      Strength   Overall Strength  Within functional limits for tasks performed    Right Hip ABduction  3/5    Left Hip ABduction  4/5                    [facility] Adult PT Treatment/Exercise - 09/28/19 0001      Self-Care   Self-Care  Other Self-Care Comments    Other Self-Care Comments   how to use dilators and progress herself; vaginal moisturizers      Lumbar Exercises: Stretches   Active Hamstring Stretch  Right;Left;1 rep;30 seconds    Active Hamstring Stretch  Limitations  supine    Double Knee to Chest Stretch  1 rep;30 seconds    Lower Trunk Rotation  2 reps;30 seconds    Hip Flexor Stretch  Right;Left;1 rep;30 seconds    Hip Flexor Stretch Limitations  sitting    Piriformis Stretch  Right;Left;1 rep;30 seconds    Piriformis Stretch Limitations  supine    Other Lumbar Stretch Exercise  butterfly stretch 1 min       Lumbar Exercises: Standing   Other Standing Lumbar Exercises  leaning on counter performing cat/cow, lumbar stretch, lift alternate extremity             PT Education - 09/28/19 1402    Education Details  Access Code: [email or web address]; you tube video for pelvic floor meditation; you tube video for vaginal dilators; instructed patient on how to use dilators and progress    Person(s) Educated  Patient    Methods  Explanation;Demonstration;Verbal cues;Handout    Comprehension  Verbalized understanding;Returned demonstration       PT Short Term Goals - 08/17/19 1210      PT SHORT TERM GOAL #1   Title  independent with intial HEP    Time  4    Period  Weeks    Status  New    Target Date  09/14/19      PT SHORT TERM GOAL #2   Title  understand anal care while recieving radiation    Baseline  --    Time  4    Period  Weeks    Status  New    Target Date  09/14/19      PT SHORT TERM GOAL #3   Title  education on bowel health and toileting for while she is recieving radiation    Baseline  --    Time  4    Period  Weeks    Status  New    Target Date  09/14/19        PT Long Term Goals - 08/17/19 1211      PT LONG TERM GOAL #1   Title  Patient is able to contract the pelvic floor correctly and has not fecal leakage from radiation    Time  8    Period  Weeks    Status  New    Target Date  10/12/19      PT LONG TERM  GOAL #2   Title  understands the use of a dilator for the vaginal canal to reduce the effects from getting tighter due to the radiation    Baseline  --    Time  8    Period  Weeks    Status  New     Target Date  10/12/19      PT LONG TERM GOAL #3   Title  able to perform correct diaphragmatic breathing to relax the pelvic floor fully after a contraction so she is able to increase strength    Baseline  ---    Time  8    Period  Weeks    Status  New    Target Date  10/12/19            Plan - 09/28/19 1410    Clinical Impression Statement  Patient has finished her chemotherapy treatment and has one more week for her radiation. Patient was given dilators from radiation for the vaginal. She was instructed on how to use the dilators and how to progress herself. Patient was educated on vaginal moisturizers and where to place them to reduce vaginal dryness. Patient was educated on hip stretches to keep the mobility after radiation due to the tissue continuing to change. Patient is doing her pelvic floor contractions. Patient bowel movements are thin. She is not having issues with urination. Patient has pain after a bowel movement. At this time patient will be seen in 1-2 months to assess her HEP and see if she needs assistance to progress her program.    Personal Factors and Comorbidities  Comorbidity 3+    Comorbidities  HIV; Osteoporosis; anal cancer with current radiation and chemotherapy    Examination-Activity Limitations  Toileting;Hygiene/Grooming    Examination-Participation Restrictions  Interpersonal Relationship    Stability/Clinical Decision Making  Evolving/Moderate complexity    Rehab Potential  Good    PT Frequency  1x / week    PT Duration  8 weeks    PT Treatment/Interventions  ADLs/Self Care Home Management;Biofeedback;Neuromuscular re-education;Therapeutic exercise;Therapeutic activities;Functional mobility training;Patient/family education;Manual techniques;Energy conservation    PT Next Visit Plan  assess patient in 1-2 months to see how she is doing and if she needs to be progressed with her HEP    PT Home Exercise Plan  Access Code: [email or web address]    Recommended Other  Services  MD signed initial note    Consulted and Agree with Plan of Care  Patient       Patient will benefit from skilled therapeutic intervention in order to improve the following deficits and impairments:  Decreased strength, Other (comment)(deficits from radiation at this time)  Visit Diagnosis: Muscle weakness (generalized) - Plan: PT plan of care cert/re-cert  Other lack of coordination - Plan: PT plan of care cert/re-cert  Anal cancer ([place]) - Plan: PT plan of care cert/re-cert     Problem List Patient Active Problem List   Diagnosis Date Noted  . Anal cancer ([place]) 07/26/2019  . Rectal mass 07/14/2019  . Constipation 07/14/2019  . Anal fissure 07/14/2019  . Healthcare maintenance 06/30/2018  . Left knee pain 12/30/2017  . Left shoulder pain 12/30/2017  . Closed fracture of right tibial plateau 02/18/2017  . Benign hypertension 02/15/2017  . Screening examination for venereal disease 03/06/2015  . Encounter for long-term (current) use of medications 03/06/2015  . Elevated LFTs 12/11/2014  . Abnormal ultrasound 12/11/2014  . Abdominal pain, epigastric 12/11/2014  . ASCUS of cervix with negative high risk  HPV 10/31/2013  . Gall bladder disease 02/03/2013  . 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  . Compression fracture of L3 lumbar vertebra 07/01/2011  . Thyroid dysfunction 07/01/2011  . Anemia 07/01/2011  . Sore throat 04/02/2011  . Hyperparathyroidism 02/10/2011  . Osteopenia 02/03/2011  . PAP SMEAR, LGSIL, ABNORMAL 12/21/2009  . COUGH 07/18/2009  . Chronic hepatitis C virus infection ([place]) 12/19/2008  . LEUKORRHEA 04/07/2008  . FATIGUE 04/07/2008  . BACK PAIN, LUMBAR 11/10/2007  . ALLERGIC RHINITIS 07/09/2007  . PNEUMOCYSTIS PNEUMONIA 05/07/2007  . HYPERTENSION 05/07/2007  . DENTAL CARIES 05/07/2007  . INSOMNIA 05/07/2007  . PNEUMONIA, HX OF 05/07/2007  . HERPES ZOSTER, HX OF 05/07/2007  . Human immunodeficiency virus (HIV)  disease ([place]) 08/04/2006    Earlie Counts, PT 09/28/19 2:19 PM   [facility] [address], [place], [place], [address] Phone: [phone]   Fax:  [phone]  Name: Vanessa Beard MRN: [id number] Date of Birth: 1950/12/24  PHYSICAL THERAPY DISCHARGE SUMMARY  Visits from Start of Care: 2  Current functional level related to goals / functional outcomes: Unable to reassess patient due to her not returning to therapy. Patient last appointment she attended was on 09/28/2019.     Remaining deficits: See above.    Education / Equipment: HEP Plan:                                                    Patient goals were not met. Patient is being discharged due to not returning since the last visit.  Thank you for the referral. Earlie Counts, PT 11/29/19 1:33 PM  ?????

## 2019-09-29 ENCOUNTER — Ambulatory Visit (INDEPENDENT_AMBULATORY_CARE_PROVIDER_SITE_OTHER): Payer: Medicare HMO | Admitting: Internal Medicine

## 2019-09-29 ENCOUNTER — Other Ambulatory Visit: Payer: Self-pay

## 2019-09-29 ENCOUNTER — Encounter: Payer: Self-pay | Admitting: Internal Medicine

## 2019-09-29 ENCOUNTER — Ambulatory Visit: Payer: Medicare HMO

## 2019-09-29 ENCOUNTER — Ambulatory Visit
Admission: RE | Admit: 2019-09-29 | Discharge: 2019-09-29 | Disposition: A | Discharge status: Home / Self Care | Payer: Medicare HMO | Source: Ambulatory Visit | Attending: Radiation Oncology | Admitting: Radiation Oncology

## 2019-09-29 VITALS — BP 112/77 | HR 78 | Wt 161.8 lb

## 2019-09-29 DIAGNOSIS — C20 Malignant neoplasm of rectum: Secondary | ICD-10-CM | POA: Diagnosis not present

## 2019-09-29 DIAGNOSIS — R1310 Dysphagia, unspecified: Secondary | ICD-10-CM

## 2019-09-29 DIAGNOSIS — B20 Human immunodeficiency virus [HIV] disease: Secondary | ICD-10-CM

## 2019-09-29 DIAGNOSIS — Z510 Encounter for antineoplastic radiation therapy: Secondary | ICD-10-CM | POA: Diagnosis not present

## 2019-09-29 DIAGNOSIS — D702 Other drug-induced agranulocytosis: Secondary | ICD-10-CM

## 2019-09-29 DIAGNOSIS — Z79899 Other long term (current) drug therapy: Secondary | ICD-10-CM

## 2019-09-29 DIAGNOSIS — C210 Malignant neoplasm of anus, unspecified: Secondary | ICD-10-CM | POA: Diagnosis not present

## 2019-09-29 DIAGNOSIS — R69 Illness, unspecified: Secondary | ICD-10-CM | POA: Diagnosis not present

## 2019-09-29 NOTE — Progress Notes (Signed)
RFV: follow up for hiv disease, also concurrent anal cancer  Patient ID: Vanessa Beard, female   DOB: 10-11-1950, 69 y.o.   MRN: [id number]  HPI 69yo F with well controlled HIV disease, also recently has been diagnosed with anal cancer. She has Finished chemo, now doing radiation course. Feels more fatigue from radiation.  Has had leukopenia for the last month.   Potassium tab stuck in throat she has noticed.  Trying to think about to doing a podcast as her next activity  Outpatient Encounter Medications as of 09/29/2019  Medication Sig  . TRIUMEQ 600-50-300 MG tablet TAKE 1 TABLET BY MOUTH DAILY (Patient taking differently: Take 1 tablet by mouth daily. )  . acetaminophen (TYLENOL) 500 MG tablet Take 1,000 mg by mouth every 6 (six) hours as needed for moderate pain or headache.  . AMBULATORY NON FORMULARY MEDICATION Nitroglycerine ointment 0.125 %  Apply a pea sized amount internally four times daily.  Marland Kitchen amLODipine (NORVASC) 10 MG tablet Take 1 tablet (10 mg total) by mouth daily.  . diphenoxylate-atropine (LOMOTIL) 2.5-0.025 MG tablet Take 1-2 tablets by mouth 4 (four) times daily as needed for diarrhea or loose stools.  . fluconazole (DIFLUCAN) 150 MG tablet Take 1 tablet (150 mg total) by mouth daily.  Marland Kitchen ibuprofen (ADVIL) 800 MG tablet TAKE 1 TABLET BY MOUTH EVERY 8 HOURS AS NEEDED  . ondansetron (ZOFRAN) 8 MG tablet Take 1 tablet (8 mg total) by mouth 2 (two) times daily as needed (Nausea or vomiting).  Marland Kitchen oxyCODONE (OXY IR/ROXICODONE) 5 MG immediate release tablet Take 1-2 tablets (5-10 mg total) by mouth every 6 (six) hours as needed for severe pain.  . potassium chloride SA (KLOR-CON) 20 MEQ tablet Take 1 tablet (20 mEq total) by mouth 2 (two) times daily.  . prochlorperazine (COMPAZINE) 10 MG tablet Take 1 tablet (10 mg total) by mouth every 6 (six) hours as needed (Nausea or vomiting).  . zolpidem (AMBIEN) 10 MG tablet Take 1 tablet (10 mg total) by mouth at bedtime as  needed. (Patient taking differently: Take 10 mg by mouth at bedtime as needed for sleep. )   No facility-administered encounter medications on file as of 09/29/2019.     Patient Active Problem List   Diagnosis Date Noted  . Anal cancer ([place]) 07/26/2019  . Rectal mass 07/14/2019  . Constipation 07/14/2019  . Anal fissure 07/14/2019  . Healthcare maintenance 06/30/2018  . Left knee pain 12/30/2017  . Left shoulder pain 12/30/2017  . Closed fracture of right tibial plateau 02/18/2017  . Benign hypertension 02/15/2017  . Screening examination for venereal disease 03/06/2015  . Encounter for long-term (current) use of medications 03/06/2015  . Elevated LFTs 12/11/2014  . Abnormal ultrasound 12/11/2014  . Abdominal pain, epigastric 12/11/2014  . ASCUS of cervix with negative high risk HPV 10/31/2013  . Gall bladder disease 02/03/2013  . 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  . Compression fracture of L3 lumbar vertebra 07/01/2011  . Thyroid dysfunction 07/01/2011  . Anemia 07/01/2011  . Sore throat 04/02/2011  . Hyperparathyroidism 02/10/2011  . Osteopenia 02/03/2011  . PAP SMEAR, LGSIL, ABNORMAL 12/21/2009  . COUGH 07/18/2009  . Chronic hepatitis C virus infection ([place]) 12/19/2008  . LEUKORRHEA 04/07/2008  . FATIGUE 04/07/2008  . BACK PAIN, LUMBAR 11/10/2007  . ALLERGIC RHINITIS 07/09/2007  . PNEUMOCYSTIS PNEUMONIA 05/07/2007  . HYPERTENSION 05/07/2007  . DENTAL CARIES 05/07/2007  . INSOMNIA 05/07/2007  . PNEUMONIA, HX OF 05/07/2007  . HERPES ZOSTER, HX  OF 05/07/2007  . Human immunodeficiency virus (HIV) disease ([place]) 08/04/2006     Health Maintenance Due  Topic Date Due  . COVID-19 Vaccine (1) Never done  . PNA vac Low Risk Adult (2 of 2 - PPSV23) 11/17/2018     Review of Systems +proctitis, mild dysphagia with certain pills. 12 point ros is otherwise negative Physical Exam   Wt 161 lb 12.8 oz (73.4 kg)   BMI 31.60 kg/m   Physical Exam    Constitutional:  oriented to person, place, and time. appears well-developed and well-nourished. No distress.  HENT: [place]/AT, PERRLA, no scleral icterus Mouth/Throat: Oropharynx is clear and moist. No oropharyngeal exudate.  Cardiovascular: Normal rate, regular rhythm and normal heart sounds. Exam reveals no gallop and no friction rub.  No murmur heard.  Pulmonary/Chest: Effort normal and breath sounds normal. No respiratory distress.  has no wheezes.  Neck = supple, no nuchal rigidity Abdominal: Soft. Bowel sounds are normal.  exhibits no distension. There is no tenderness.  Lymphadenopathy: no cervical adenopathy. No axillary adenopathy Neurological: alert and oriented to person, place, and time.  Skin: Skin is warm and dry. No rash noted. No erythema.  Psychiatric: a normal mood and affect.  behavior is normal.   Lab Results  Component Value Date   CD4TCELL 19 (L) 08/08/2019   Lab Results  Component Value Date   CD4TABS 237 (L) 08/08/2019   CD4TABS 213 (L) 10/04/2018   CD4TABS 300 (L) 06/30/2018   Lab Results  Component Value Date   HIV1RNAQUANT <20 DETECTED (A) 08/08/2019   Lab Results  Component Value Date   HEPBSAB NEG 12/29/2013   Lab Results  Component Value Date   LABRPR NON-REACTIVE 10/04/2018    CBC Lab Results  Component Value Date   WBC 2.3 (L) 09/26/2019   RBC 3.02 (L) 09/26/2019   HGB 9.4 (L) 09/26/2019   HCT 27.9 (L) 09/26/2019   PLT 67 (L) 09/26/2019   MCV 92.4 09/26/2019   MCH 31.1 09/26/2019   MCHC 33.7 09/26/2019   RDW 15.5 09/26/2019   LYMPHSABS 0.1 (L) 09/26/2019   MONOABS 0.5 09/26/2019   EOSABS 0.0 09/26/2019    BMET Lab Results  Component Value Date   NA 143 09/26/2019   K 3.4 (L) 09/26/2019   CL 107 09/26/2019   CO2 24 09/26/2019   GLUCOSE 109 (H) 09/26/2019   BUN 14 09/26/2019   CREATININE 0.86 09/26/2019   CALCIUM 9.2 09/26/2019   GFRNONAA >60 09/26/2019   GFRAA >60 09/26/2019      Assessment and Plan  Pill dysphagaia  = Will instruct to crush potassium for consumption to treat  hypokalemia  hiv disease well controlled- cotninue with triumeq  Leukopenia= suspect it is shortlived since chemo is complete. She has allergies to dapson and sulfa. However, if this is short term she may not need oi proph

## 2019-09-30 ENCOUNTER — Ambulatory Visit: Payer: Medicare HMO

## 2019-09-30 ENCOUNTER — Other Ambulatory Visit: Payer: Self-pay

## 2019-09-30 ENCOUNTER — Inpatient Hospital Stay: Payer: Medicare HMO

## 2019-09-30 ENCOUNTER — Ambulatory Visit
Admission: RE | Admit: 2019-09-30 | Discharge: 2019-09-30 | Disposition: A | Discharge status: Home / Self Care | Payer: Medicare HMO | Source: Ambulatory Visit | Attending: Radiation Oncology | Admitting: Radiation Oncology

## 2019-09-30 DIAGNOSIS — D61810 Antineoplastic chemotherapy induced pancytopenia: Secondary | ICD-10-CM | POA: Diagnosis not present

## 2019-09-30 DIAGNOSIS — R0609 Other forms of dyspnea: Secondary | ICD-10-CM | POA: Diagnosis not present

## 2019-09-30 DIAGNOSIS — C210 Malignant neoplasm of anus, unspecified: Secondary | ICD-10-CM

## 2019-09-30 DIAGNOSIS — K6289 Other specified diseases of anus and rectum: Secondary | ICD-10-CM | POA: Diagnosis not present

## 2019-09-30 DIAGNOSIS — Z510 Encounter for antineoplastic radiation therapy: Secondary | ICD-10-CM | POA: Diagnosis not present

## 2019-09-30 DIAGNOSIS — R300 Dysuria: Secondary | ICD-10-CM | POA: Diagnosis not present

## 2019-09-30 DIAGNOSIS — I10 Essential (primary) hypertension: Secondary | ICD-10-CM | POA: Diagnosis not present

## 2019-09-30 DIAGNOSIS — R69 Illness, unspecified: Secondary | ICD-10-CM | POA: Diagnosis not present

## 2019-09-30 DIAGNOSIS — R5383 Other fatigue: Secondary | ICD-10-CM | POA: Diagnosis not present

## 2019-09-30 DIAGNOSIS — T451X5A Adverse effect of antineoplastic and immunosuppressive drugs, initial encounter: Secondary | ICD-10-CM | POA: Diagnosis not present

## 2019-09-30 DIAGNOSIS — C20 Malignant neoplasm of rectum: Secondary | ICD-10-CM | POA: Diagnosis not present

## 2019-09-30 LAB — CMP (CANCER CENTER ONLY)
ALT: 9 U/L (ref 0–44)
AST: 22 U/L (ref 15–41)
Albumin: 3.3 g/dL — ABNORMAL LOW (ref 3.5–5.0)
Alkaline Phosphatase: 68 U/L (ref 38–126)
Anion gap: 11 (ref 5–15)
BUN: 13 mg/dL (ref 8–23)
CO2: 24 mmol/L (ref 22–32)
Calcium: 9.5 mg/dL (ref 8.9–10.3)
Chloride: 104 mmol/L (ref 98–111)
Creatinine: 1.03 mg/dL — ABNORMAL HIGH (ref 0.44–1.00)
GFR, Est AFR Am: >60 mL/min (ref >60)
GFR, Estimated: 55 mL/min — ABNORMAL LOW (ref >60)
Glucose, Bld: 128 mg/dL — ABNORMAL HIGH (ref 70–99)
Potassium: 3.4 mmol/L — ABNORMAL LOW (ref 3.5–5.1)
Sodium: 139 mmol/L (ref 135–145)
Total Bilirubin: 0.5 mg/dL (ref 0.3–1.2)
Total Protein: 7.9 g/dL (ref 6.5–8.1)

## 2019-09-30 LAB — CBC WITH DIFFERENTIAL (CANCER CENTER ONLY)
Abs Immature Granulocytes: 0.01 10*3/uL (ref 0.00–0.07)
Basophils Absolute: 0 10*3/uL (ref 0.0–0.1)
Basophils Relative: 1 %
Eosinophils Absolute: 0 10*3/uL (ref 0.0–0.5)
Eosinophils Relative: 1 %
HCT: 28.2 % — ABNORMAL LOW (ref 36.0–46.0)
Hemoglobin: 9.6 g/dL — ABNORMAL LOW (ref 12.0–15.0)
Immature Granulocytes: 1 %
Lymphocytes Relative: 3 %
Lymphs Abs: 0.1 10*3/uL — ABNORMAL LOW (ref 0.7–4.0)
MCH: 30.8 pg (ref 26.0–34.0)
MCHC: 34 g/dL (ref 30.0–36.0)
MCV: 90.4 fL (ref 80.0–100.0)
Monocytes Absolute: 0.5 10*3/uL (ref 0.1–1.0)
Monocytes Relative: 30 %
Neutro Abs: 1.1 10*3/uL — ABNORMAL LOW (ref 1.7–7.7)
Neutrophils Relative %: 64 %
Platelet Count: 51 10*3/uL — ABNORMAL LOW (ref 150–400)
RBC: 3.12 MIL/uL — ABNORMAL LOW (ref 3.87–5.11)
RDW: 16.4 % — ABNORMAL HIGH (ref 11.5–15.5)
WBC Count: 1.7 10*3/uL — ABNORMAL LOW (ref 4.0–10.5)
nRBC: 0 % (ref 0.0–0.2)

## 2019-10-02 ENCOUNTER — Other Ambulatory Visit: Payer: Self-pay | Admitting: Nurse Practitioner

## 2019-10-02 DIAGNOSIS — C210 Malignant neoplasm of anus, unspecified: Secondary | ICD-10-CM

## 2019-10-02 NOTE — Progress Notes (Signed)
[place]   [phone] [phone]   Clinic Follow up Note   Patient Care Team: Seward Carol, MD as PCP - General (Internal Medicine) Carlyle Basques, MD as PCP - Infectious Diseases (Infectious Diseases) Binnie Rail, DC as Referring Physician (Chiropractic Medicine) Jonnie Finner, RN as Oncology Nurse Navigator 10/03/2019  CHIEF COMPLAINT: F/u anal cancer   SUMMARY OF ONCOLOGIC HISTORY: Oncology History Overview Note  Cancer Staging Anal cancer ([place]) Staging form: Anus, AJCC 8th Edition - Clinical: Stage IV (cT2, cN0, cM1) - Signed by Truitt Merle, MD on 08/12/2019    Anal cancer ([place])  07/19/2019 Procedure   Colonoscopy by Dr Havery Moros 07/19/19  IMPRESSION - Ulcer noted at the anal canal in posterior midline canal that extends into the distal rectum on digital rectal exam, nodular and somewhat hard to palpation. Endoscopic images show nodular tissue in the area, concerning for malignant ulcer. - The examined portion of the ileum was normal. - One 3 mm polyp at the hepatic flexure, removed with a cold snare. Resected and retrieved. - One 5 mm polyp in the transverse colon, removed with a cold snare. Resected and retrieved. - Diverticulosis in the ascending colon. - The examination was otherwise normal.   07/19/2019 Initial Biopsy   Diagnosis 07/19/19 1. Transverse Colon Polyp, hepatic flexure (2) - TUBULAR ADENOMA (1 OF 3 FRAGMENTS) - BENIGN COLONIC MUCOSA (2 OF 3 FRAGMENTS) - NO HIGH GRADE DYSPLASIA OR MALIGNANCY IDENTIFIED 2. Rectum, biopsy, distal rectal anal canal - SQUAMOUS CELL CARCINOMA - SEE COMMENT   07/26/2019 Initial Diagnosis   Anal cancer (Vanessa Beard)   07/26/2019 Cancer Staging   Staging form: Anus, AJCC 8th Edition - Clinical: Stage IV (cT2, cN1c, cM1) - Signed by Truitt Merle, MD on 08/12/2019   08/04/2019 PET scan   IMPRESSION: Hypermetabolic anal soft tissue mass, consistent with known primary anal carcinoma.   Sub-cm  hypermetabolic lymph nodes in posterior perirectal space, right inguinal region, right iliac chain, and gastrohepatic ligament, suspicious for metastatic disease.   Small hypermetabolic left axillary lymph nodes. This would be unusual location for metastatic anal carcinoma. Recommend clinical correlation for possibility the patient has had recent COVID vaccination in the left arm, which could explain this finding.   Multifocal airspace disease in both lower lungs with marked hypermetabolic activity. This favors infectious or inflammatory etiology, and is not typical for pulmonary metastases. Short-term follow-up by chest CT is recommended.   08/15/2019 -  Radiation Therapy   Concurrent ChemoRT with Dr. Lisbeth Renshaw starting 08/15/19. Plan to complete on 10/06/19   08/15/2019 - 09/12/2019 Chemotherapy   Concurrent ChemoRT with Mitomycin and 5FU on week 1 and 5 starting 08/15/19 with week 5 dose on 09/12/19.      CURRENT THERAPY: Concurrent chemoRT with Mitomycin and 5FU on week 1 and 5 for 6 weeks starting 08/15/19.Plan to complete on 10/06/19  INTERVAL HISTORY: Ms. [name] returns for f/u as scheduled. Last chemo 4/26 - 4/30, she continues RT. She had mild nausea and fatigue over the weekend and rested more. No vomiting. She is drinking adequately. Keeps stools soft, no constipation or diarrhea. Denies bleeding. Anal pain worsens with BM. She takes oxycodone and NSAID before treatment which manages pain. She does not take potassium consistently daily. Denies mucositis, bleeding, fever, chills, cough, chest pain, dyspnea.    MEDICAL HISTORY:  Past Medical History:  Diagnosis Date  . Allergic rhinitis   . Biliary colic   . Gall bladder disease \  . Hepatitis  C   . Herpes   . HIV infection ([facility]essa Beard)   . Hyperparathyroidism ([place])   . Hypertension   . Insomnia   . Osteoporosis   . Pneumonia    2010    SURGICAL HISTORY: Past Surgical History:  Procedure Laterality Date  . CHOLECYSTECTOMY N/A  01/09/2015   Procedure: LAPAROSCOPIC CHOLECYSTECTOMY;  Surgeon: [name], MD;  Location: [facility];  Service: General;  Laterality: N/A;  . ECTOPIC PREGNANCY SURGERY    . ESOPHAGOGASTRODUODENOSCOPY (EGD) WITH PROPOFOL N/A 09/08/2019   Procedure: ESOPHAGOGASTRODUODENOSCOPY (EGD) WITH PROPOFOL;  Surgeon: Milus Banister, MD;  Location: [facility];  Service: Endoscopy;  Laterality: N/A;  . IR FLUORO GUIDE CV LINE LEFT  08/15/2019  . ORIF TIBIA PLATEAU Right 02/18/2017   Procedure: OPEN REDUCTION INTERNAL FIXATION (ORIF) RIGHT TIBIAL PLATEAU;  Surgeon: Leandrew Koyanagi, MD;  Location: [facility];  Service: Orthopedics;  Laterality: Right;  . SHOULDER SURGERY Right 12/2014  . UPPER ESOPHAGEAL ENDOSCOPIC ULTRASOUND (EUS) N/A 09/08/2019   Procedure: UPPER ESOPHAGEAL ENDOSCOPIC ULTRASOUND (EUS);  Surgeon: Milus Banister, MD;  Location: [facility];  Service: Endoscopy;  Laterality: N/A;    I have reviewed the social history and family history with the patient and they are unchanged from previous note.  ALLERGIES:  is allergic to dapsone; retrovir [zidovudine]; and sulfamethoxazole-trimethoprim.  MEDICATIONS:  Current Outpatient Medications  Medication Sig Dispense Refill  . acetaminophen (TYLENOL) 500 MG tablet Take 1,000 mg by mouth every 6 (six) hours as needed for moderate pain or headache.    . AMBULATORY NON FORMULARY MEDICATION Nitroglycerine ointment 0.125 %  Apply a pea sized amount internally four times daily. 30 g 1  . amLODipine (NORVASC) 10 MG tablet Take 1 tablet (10 mg total) by mouth daily. 90 tablet 3  . diphenoxylate-atropine (LOMOTIL) 2.5-0.025 MG tablet Take 1-2 tablets by mouth 4 (four) times daily as needed for diarrhea or loose stools. 30 tablet 1  . fluconazole (DIFLUCAN) 150 MG tablet Take 1 tablet (150 mg total) by mouth daily. 2 tablet 0  . ibuprofen (ADVIL) 800 MG tablet TAKE 1 TABLET BY MOUTH EVERY 8 HOURS AS NEEDED 90 tablet 0  . ondansetron (ZOFRAN) 8 MG tablet Take 1  tablet (8 mg total) by mouth 2 (two) times daily as needed (Nausea or vomiting). 30 tablet 1  . oxyCODONE (OXY IR/ROXICODONE) 5 MG immediate release tablet Take 1-2 tablets (5-10 mg total) by mouth every 6 (six) hours as needed for severe pain. 60 tablet 0  . potassium chloride SA (KLOR-CON) 20 MEQ tablet Take 1 tablet (20 mEq total) by mouth 2 (two) times daily. 30 tablet 0  . prochlorperazine (COMPAZINE) 10 MG tablet Take 1 tablet (10 mg total) by mouth every 6 (six) hours as needed (Nausea or vomiting). 30 tablet 1  . TRIUMEQ 600-50-300 MG tablet TAKE 1 TABLET BY MOUTH DAILY (Patient taking differently: Take 1 tablet by mouth daily. ) 30 tablet 3  . zolpidem (AMBIEN) 10 MG tablet Take 1 tablet (10 mg total) by mouth at bedtime as needed. (Patient taking differently: Take 10 mg by mouth at bedtime as needed for sleep. ) 30 tablet 1   No current facility-administered medications for this visit.    PHYSICAL EXAMINATION: ECOG PERFORMANCE STATUS: 1 - Symptomatic but completely ambulatory  Vitals:   10/03/19 1419  BP: 112/90  Pulse: 89  Resp: 18  Temp: 98.9 F (37.2 C)  SpO2: 99%   Filed Weights   10/03/19 1419  Weight: 161  lb 1.6 oz (73.1 kg)    GENERAL:alert, no distress and comfortable SKIN: no rash  EYES: sclera clear OROPHARYNX: no thrush or ulcers LUNGS:  normal breathing effort HEART:  no lower extremity edema NEURO: alert & oriented x 3 with fluent speech, normal gait RECTAL: hyperpigmentation to gluteal cleft with 2 small right and left perianal ulcerations   LABORATORY DATA:  I have reviewed the data as listed CBC Latest Ref Rng & Units 10/03/2019 09/30/2019 09/26/2019  WBC 4.0 - 10.5 K/uL 2.7(L) 1.7(L) 2.3(L)  Hemoglobin 12.0 - 15.0 g/dL 9.5(L) 9.6(L) 9.4(L)  Hematocrit 36.0 - 46.0 % 28.4(L) 28.2(L) 27.9(L)  Platelets 150 - 400 K/uL 50(L) 51(L) 67(L)     CMP Latest Ref Rng & Units 10/03/2019 09/30/2019 09/26/2019  Glucose 70 - 99 mg/dL 105(H) 128(H) 109(H)  BUN 8 -  23 mg/dL 11 13 14   Creatinine 0.44 - 1.00 mg/dL 0.89 1.03(H) 0.86  Sodium 135 - 145 mmol/L 138 139 143  Potassium 3.5 - 5.1 mmol/L 3.3(L) 3.4(L) 3.4(L)  Chloride 98 - 111 mmol/L 105 104 107  CO2 22 - 32 mmol/L 24 24 24   Calcium 8.9 - 10.3 mg/dL 9.0 9.5 9.2  Total Protein 6.5 - 8.1 g/dL 7.9 7.9 7.6  Total Bilirubin 0.3 - 1.2 mg/dL 0.5 0.5 0.5  Alkaline Phos 38 - 126 U/L 66 68 61  AST 15 - 41 U/L 22 22 20   ALT 0 - 44 U/L 10 9 8       RADIOGRAPHIC STUDIES: I have personally reviewed the radiological images as listed and agreed with the findings in the report. No results found.   ASSESSMENT & PLAN: Vanessa OSTENSON a 69 y.o.femalewith    1.Anal squamous Cell Carcinoma, cT2N1Mx, with hypermetabolic left axillary and gastrohepatic ligament nodes -She was diagnosed in recently in 07/2019.Based on the description on colonoscopy report, shehasmore than 2 cmlong mass in herlowrectumanal canal. Her biopsy results show Squamous Cell Carcinoma.This is consistent with anal cancer. She has HIV, which is a high risk for anal cancer. -PET from 08/04/19 showedhypermetabolicanal mass, hypermetabolicLNsin posterior perirectal space, right inguinal region, right iliac chain and gastrohepatic ligament suspicious for metastasis. Scan also showed hypermetabolic left axillary LNs, which is unusual metastatic pattern. Shedenies recent injury or injections in the left arm.A biopsy has been recommendedtorule outdistant metastasis. Upon EUS on 09/08/19, the LN was not found so biopsy was not done  -Althoughmetastatic disease is not ruled out at this point,Dr. Lisbeth Renshaw is able to cover thegastrohepatic lymph node in the radiation field. -She began concurrent chemoRT with mitomycin and 5FU (week 1 and week 5) on 08/15/19, RT held on days 17 and 18 for neutropenia ANC 0.3 which recovered and she resumed RT on 09/02/19.  -She began week 5 chemo with 50% dose reduced mitomycin due to pancytopenia  from 09/12/19 - 09/16/19. -Plan to repeat CT chest to evaluate her left axillary lymph node after she completes chemoradiation. -Ms. Loredo appears stable. She completed chemo and continues RT which she tolerates well with expected mild skin toxicity. Anal pain is controlled with oxycodone and Ibuprofen before RT.  -Labs reviewed, PLT 50K K 3.3. we reviewed bleeding precautions. She agrees to start K BID consistently. Will repeat labs on last day of RT 5/21. -she will return for lab and f/u in 7-10 days to monitor her symptoms. Will order CT chest at next visit.   2. Pancytopenia, secondary to chemoRT -Nadir plt 44K on day 15 and ANC 0.3 on day 17; anemia remained  mild and stable in 10 range -Mitomycin dose reduced 50% for day 28 chemo.  -PLT 50K, denies bleeding. Monitoring closely  -repeat CBC 5/21  3. Hep C, HIV -She underwentsuccessfulHep C treatment 2 years ago [id number]) -Her HIV was contracted through sex. Her HIV is well controlled. She will continue to f/u ID Dr Baxter Flattery.Last CD4 count was 386 on 02/07/2019. -Compliant with Triumeq -last f/u with Dr. Baxter Flattery 09/29/19   4. Comorbidities: HTN, Hyperparathyroidism, Osteoporosis -Continue medications and f/u with PCP. -BPstable  5. Social Support  -She is divorced, lives alone and has 2 adult children who live in [place]  -She is getting bachelors degree in [place] through Online schooling, she plans toresume classes in6/2021, she prefers not to postpone if possible. -She is currently working at [employer] as [employer] and works 8-9 hours every day. She notes her work is flexible to take time off if needed.  PLAN: -Lab 5/21 with final RT -F/u in 7-10 days, will order CT chest at next visit  -Take K 1 tab BID consistently   All questions were answered. The patient knows to call the clinic with any problems, questions or concerns. No barriers to learning was detected.     Alla Feeling, NP 10/03/19

## 2019-10-03 ENCOUNTER — Inpatient Hospital Stay (HOSPITAL_BASED_OUTPATIENT_CLINIC_OR_DEPARTMENT_OTHER): Payer: Medicare HMO | Admitting: Nurse Practitioner

## 2019-10-03 ENCOUNTER — Encounter: Payer: Self-pay | Admitting: Nurse Practitioner

## 2019-10-03 ENCOUNTER — Inpatient Hospital Stay: Payer: Medicare HMO

## 2019-10-03 ENCOUNTER — Ambulatory Visit
Admission: RE | Admit: 2019-10-03 | Discharge: 2019-10-03 | Disposition: A | Discharge status: Home / Self Care | Payer: Medicare HMO | Source: Ambulatory Visit | Attending: Radiation Oncology | Admitting: Radiation Oncology

## 2019-10-03 ENCOUNTER — Ambulatory Visit: Payer: Medicare HMO

## 2019-10-03 ENCOUNTER — Other Ambulatory Visit: Payer: Self-pay

## 2019-10-03 VITALS — BP 112/90 | HR 89 | Temp 98.9°F | Resp 18 | Ht 60.0 in | Wt 161.1 lb

## 2019-10-03 DIAGNOSIS — K6289 Other specified diseases of anus and rectum: Secondary | ICD-10-CM | POA: Diagnosis not present

## 2019-10-03 DIAGNOSIS — C20 Malignant neoplasm of rectum: Secondary | ICD-10-CM | POA: Diagnosis not present

## 2019-10-03 DIAGNOSIS — R300 Dysuria: Secondary | ICD-10-CM | POA: Diagnosis not present

## 2019-10-03 DIAGNOSIS — D61810 Antineoplastic chemotherapy induced pancytopenia: Secondary | ICD-10-CM | POA: Diagnosis not present

## 2019-10-03 DIAGNOSIS — R69 Illness, unspecified: Secondary | ICD-10-CM | POA: Diagnosis not present

## 2019-10-03 DIAGNOSIS — C210 Malignant neoplasm of anus, unspecified: Secondary | ICD-10-CM

## 2019-10-03 DIAGNOSIS — T451X5A Adverse effect of antineoplastic and immunosuppressive drugs, initial encounter: Secondary | ICD-10-CM | POA: Diagnosis not present

## 2019-10-03 DIAGNOSIS — R5383 Other fatigue: Secondary | ICD-10-CM | POA: Diagnosis not present

## 2019-10-03 DIAGNOSIS — I10 Essential (primary) hypertension: Secondary | ICD-10-CM | POA: Diagnosis not present

## 2019-10-03 DIAGNOSIS — R0609 Other forms of dyspnea: Secondary | ICD-10-CM | POA: Diagnosis not present

## 2019-10-03 DIAGNOSIS — Z510 Encounter for antineoplastic radiation therapy: Secondary | ICD-10-CM | POA: Diagnosis not present

## 2019-10-03 LAB — CBC WITH DIFFERENTIAL (CANCER CENTER ONLY)
Abs Immature Granulocytes: 0.04 10*3/uL (ref 0.00–0.07)
Basophils Absolute: 0 10*3/uL (ref 0.0–0.1)
Basophils Relative: 1 %
Eosinophils Absolute: 0 10*3/uL (ref 0.0–0.5)
Eosinophils Relative: 1 %
HCT: 28.4 % — ABNORMAL LOW (ref 36.0–46.0)
Hemoglobin: 9.5 g/dL — ABNORMAL LOW (ref 12.0–15.0)
Immature Granulocytes: 2 %
Lymphocytes Relative: 3 %
Lymphs Abs: 0.1 10*3/uL — ABNORMAL LOW (ref 0.7–4.0)
MCH: 30.4 pg (ref 26.0–34.0)
MCHC: 33.5 g/dL (ref 30.0–36.0)
MCV: 90.7 fL (ref 80.0–100.0)
Monocytes Absolute: 0.4 10*3/uL (ref 0.1–1.0)
Monocytes Relative: 16 %
Neutro Abs: 2.1 10*3/uL (ref 1.7–7.7)
Neutrophils Relative %: 77 %
Platelet Count: 50 10*3/uL — ABNORMAL LOW (ref 150–400)
RBC: 3.13 MIL/uL — ABNORMAL LOW (ref 3.87–5.11)
RDW: 17 % — ABNORMAL HIGH (ref 11.5–15.5)
WBC Count: 2.7 10*3/uL — ABNORMAL LOW (ref 4.0–10.5)
nRBC: 0 % (ref 0.0–0.2)

## 2019-10-03 LAB — CMP (CANCER CENTER ONLY)
ALT: 10 U/L (ref 0–44)
AST: 22 U/L (ref 15–41)
Albumin: 3.2 g/dL — ABNORMAL LOW (ref 3.5–5.0)
Alkaline Phosphatase: 66 U/L (ref 38–126)
Anion gap: 9 (ref 5–15)
BUN: 11 mg/dL (ref 8–23)
CO2: 24 mmol/L (ref 22–32)
Calcium: 9 mg/dL (ref 8.9–10.3)
Chloride: 105 mmol/L (ref 98–111)
Creatinine: 0.89 mg/dL (ref 0.44–1.00)
GFR, Est AFR Am: >60 mL/min (ref >60)
GFR, Estimated: >60 mL/min (ref >60)
Glucose, Bld: 105 mg/dL — ABNORMAL HIGH (ref 70–99)
Potassium: 3.3 mmol/L — ABNORMAL LOW (ref 3.5–5.1)
Sodium: 138 mmol/L (ref 135–145)
Total Bilirubin: 0.5 mg/dL (ref 0.3–1.2)
Total Protein: 7.9 g/dL (ref 6.5–8.1)

## 2019-10-04 ENCOUNTER — Ambulatory Visit
Admission: RE | Admit: 2019-10-04 | Discharge: 2019-10-04 | Disposition: A | Discharge status: Home / Self Care | Payer: Medicare HMO | Source: Ambulatory Visit | Attending: Radiation Oncology | Admitting: Radiation Oncology

## 2019-10-04 ENCOUNTER — Ambulatory Visit: Payer: Medicare HMO

## 2019-10-04 ENCOUNTER — Telehealth: Payer: Self-pay | Admitting: Nurse Practitioner

## 2019-10-04 ENCOUNTER — Other Ambulatory Visit: Payer: Self-pay

## 2019-10-04 DIAGNOSIS — Z510 Encounter for antineoplastic radiation therapy: Secondary | ICD-10-CM | POA: Diagnosis not present

## 2019-10-04 DIAGNOSIS — C20 Malignant neoplasm of rectum: Secondary | ICD-10-CM | POA: Diagnosis not present

## 2019-10-04 DIAGNOSIS — C210 Malignant neoplasm of anus, unspecified: Secondary | ICD-10-CM | POA: Diagnosis not present

## 2019-10-04 NOTE — Telephone Encounter (Signed)
Scheduled appt per 5/17 los.  Spoke with pt and she is aware of the appt date and time.

## 2019-10-05 ENCOUNTER — Other Ambulatory Visit: Payer: Self-pay

## 2019-10-05 ENCOUNTER — Ambulatory Visit: Payer: Medicare HMO

## 2019-10-05 ENCOUNTER — Encounter: Payer: Self-pay | Admitting: Radiation Oncology

## 2019-10-05 ENCOUNTER — Ambulatory Visit
Admission: RE | Admit: 2019-10-05 | Discharge: 2019-10-05 | Disposition: A | Discharge status: Home / Self Care | Payer: Medicare HMO | Source: Ambulatory Visit | Attending: Radiation Oncology | Admitting: Radiation Oncology

## 2019-10-05 DIAGNOSIS — C20 Malignant neoplasm of rectum: Secondary | ICD-10-CM | POA: Diagnosis not present

## 2019-10-05 DIAGNOSIS — Z510 Encounter for antineoplastic radiation therapy: Secondary | ICD-10-CM | POA: Diagnosis not present

## 2019-10-05 DIAGNOSIS — C210 Malignant neoplasm of anus, unspecified: Secondary | ICD-10-CM | POA: Diagnosis not present

## 2019-10-06 ENCOUNTER — Ambulatory Visit: Payer: Medicare HMO

## 2019-10-07 ENCOUNTER — Ambulatory Visit: Payer: Medicare HMO

## 2019-10-07 ENCOUNTER — Other Ambulatory Visit: Payer: Self-pay

## 2019-10-07 ENCOUNTER — Inpatient Hospital Stay: Payer: Medicare HMO

## 2019-10-07 DIAGNOSIS — K6289 Other specified diseases of anus and rectum: Secondary | ICD-10-CM | POA: Diagnosis not present

## 2019-10-07 DIAGNOSIS — R300 Dysuria: Secondary | ICD-10-CM | POA: Diagnosis not present

## 2019-10-07 DIAGNOSIS — R5383 Other fatigue: Secondary | ICD-10-CM | POA: Diagnosis not present

## 2019-10-07 DIAGNOSIS — T451X5A Adverse effect of antineoplastic and immunosuppressive drugs, initial encounter: Secondary | ICD-10-CM | POA: Diagnosis not present

## 2019-10-07 DIAGNOSIS — D61810 Antineoplastic chemotherapy induced pancytopenia: Secondary | ICD-10-CM | POA: Diagnosis not present

## 2019-10-07 DIAGNOSIS — C210 Malignant neoplasm of anus, unspecified: Secondary | ICD-10-CM | POA: Diagnosis not present

## 2019-10-07 DIAGNOSIS — I10 Essential (primary) hypertension: Secondary | ICD-10-CM | POA: Diagnosis not present

## 2019-10-07 DIAGNOSIS — R69 Illness, unspecified: Secondary | ICD-10-CM | POA: Diagnosis not present

## 2019-10-07 DIAGNOSIS — R0609 Other forms of dyspnea: Secondary | ICD-10-CM | POA: Diagnosis not present

## 2019-10-07 LAB — CBC WITH DIFFERENTIAL (CANCER CENTER ONLY)
Abs Immature Granulocytes: 0.04 10*3/uL (ref 0.00–0.07)
Basophils Absolute: 0 10*3/uL (ref 0.0–0.1)
Basophils Relative: 0 %
Eosinophils Absolute: 0.1 10*3/uL (ref 0.0–0.5)
Eosinophils Relative: 2 %
HCT: 28.9 % — ABNORMAL LOW (ref 36.0–46.0)
Hemoglobin: 9.6 g/dL — ABNORMAL LOW (ref 12.0–15.0)
Immature Granulocytes: 1 %
Lymphocytes Relative: 7 %
Lymphs Abs: 0.3 10*3/uL — ABNORMAL LOW (ref 0.7–4.0)
MCH: 31 pg (ref 26.0–34.0)
MCHC: 33.2 g/dL (ref 30.0–36.0)
MCV: 93.2 fL (ref 80.0–100.0)
Monocytes Absolute: 0.6 10*3/uL (ref 0.1–1.0)
Monocytes Relative: 13 %
Neutro Abs: 3.5 10*3/uL (ref 1.7–7.7)
Neutrophils Relative %: 77 %
Platelet Count: 56 10*3/uL — ABNORMAL LOW (ref 150–400)
RBC: 3.1 MIL/uL — ABNORMAL LOW (ref 3.87–5.11)
RDW: 17.8 % — ABNORMAL HIGH (ref 11.5–15.5)
WBC Count: 4.5 10*3/uL (ref 4.0–10.5)
nRBC: 0 % (ref 0.0–0.2)

## 2019-10-07 LAB — CMP (CANCER CENTER ONLY)
ALT: 9 U/L (ref 0–44)
AST: 22 U/L (ref 15–41)
Albumin: 3.5 g/dL (ref 3.5–5.0)
Alkaline Phosphatase: 70 U/L (ref 38–126)
Anion gap: 9 (ref 5–15)
BUN: 11 mg/dL (ref 8–23)
CO2: 25 mmol/L (ref 22–32)
Calcium: 9.7 mg/dL (ref 8.9–10.3)
Chloride: 106 mmol/L (ref 98–111)
Creatinine: 0.98 mg/dL (ref 0.44–1.00)
GFR, Est AFR Am: >60 mL/min (ref >60)
GFR, Estimated: 58 mL/min — ABNORMAL LOW (ref >60)
Glucose, Bld: 122 mg/dL — ABNORMAL HIGH (ref 70–99)
Potassium: 3.7 mmol/L (ref 3.5–5.1)
Sodium: 140 mmol/L (ref 135–145)
Total Bilirubin: 0.5 mg/dL (ref 0.3–1.2)
Total Protein: 8.3 g/dL — ABNORMAL HIGH (ref 6.5–8.1)

## 2019-10-10 ENCOUNTER — Telehealth: Payer: Self-pay

## 2019-10-10 NOTE — Telephone Encounter (Signed)
TC to pt per Vanessa Rue NP to let her know that her platelets remain low but stable, continue bleeding precautions. And she sees lacie this week 10/13/19 (lab@2 :15, Lacie@2 :45) patient verbalized understanding.

## 2019-10-12 ENCOUNTER — Telehealth: Payer: Self-pay

## 2019-10-12 NOTE — Telephone Encounter (Signed)
Patient called stating that she has 2 open ulcers on her bottom from radiation and they are hurting. She said that she is unable to sit down as well. She is requesting something for pain. she says pain pills do not help she was wondering if a cream to relieve the pain could be prescribed. I let patient know that after talking with Lacie she recommends taking 2 oxycodone tabs q4-6 hours until she sees her tomorrow at 2:45. Also let patient know that Regan Rakers has reached out to [facility] to recommend topical pain relief. Patient verbalized understanding.

## 2019-10-13 ENCOUNTER — Inpatient Hospital Stay (HOSPITAL_BASED_OUTPATIENT_CLINIC_OR_DEPARTMENT_OTHER): Payer: Medicare HMO | Admitting: Nurse Practitioner

## 2019-10-13 ENCOUNTER — Encounter: Payer: Self-pay | Admitting: Nurse Practitioner

## 2019-10-13 ENCOUNTER — Other Ambulatory Visit: Payer: Self-pay

## 2019-10-13 ENCOUNTER — Inpatient Hospital Stay: Payer: Medicare HMO

## 2019-10-13 VITALS — BP 102/77 | HR 98 | Temp 97.3°F | Resp 20 | Wt 156.9 lb

## 2019-10-13 DIAGNOSIS — D61810 Antineoplastic chemotherapy induced pancytopenia: Secondary | ICD-10-CM | POA: Diagnosis not present

## 2019-10-13 DIAGNOSIS — T451X5A Adverse effect of antineoplastic and immunosuppressive drugs, initial encounter: Secondary | ICD-10-CM | POA: Diagnosis not present

## 2019-10-13 DIAGNOSIS — R69 Illness, unspecified: Secondary | ICD-10-CM | POA: Diagnosis not present

## 2019-10-13 DIAGNOSIS — C210 Malignant neoplasm of anus, unspecified: Secondary | ICD-10-CM

## 2019-10-13 DIAGNOSIS — R300 Dysuria: Secondary | ICD-10-CM | POA: Diagnosis not present

## 2019-10-13 DIAGNOSIS — K6289 Other specified diseases of anus and rectum: Secondary | ICD-10-CM | POA: Diagnosis not present

## 2019-10-13 DIAGNOSIS — R5383 Other fatigue: Secondary | ICD-10-CM | POA: Diagnosis not present

## 2019-10-13 DIAGNOSIS — I10 Essential (primary) hypertension: Secondary | ICD-10-CM | POA: Diagnosis not present

## 2019-10-13 DIAGNOSIS — R0609 Other forms of dyspnea: Secondary | ICD-10-CM | POA: Diagnosis not present

## 2019-10-13 LAB — CMP (CANCER CENTER ONLY)
ALT: 15 U/L (ref 0–44)
AST: 30 U/L (ref 15–41)
Albumin: 3.6 g/dL (ref 3.5–5.0)
Alkaline Phosphatase: 68 U/L (ref 38–126)
Anion gap: 9 (ref 5–15)
BUN: 19 mg/dL (ref 8–23)
CO2: 24 mmol/L (ref 22–32)
Calcium: 9.3 mg/dL (ref 8.9–10.3)
Chloride: 107 mmol/L (ref 98–111)
Creatinine: 0.97 mg/dL (ref 0.44–1.00)
GFR, Est AFR Am: >60 mL/min (ref >60)
GFR, Estimated: 59 mL/min — ABNORMAL LOW (ref >60)
Glucose, Bld: 120 mg/dL — ABNORMAL HIGH (ref 70–99)
Potassium: 4.2 mmol/L (ref 3.5–5.1)
Sodium: 140 mmol/L (ref 135–145)
Total Bilirubin: 0.7 mg/dL (ref 0.3–1.2)
Total Protein: 8.2 g/dL — ABNORMAL HIGH (ref 6.5–8.1)

## 2019-10-13 LAB — CBC WITH DIFFERENTIAL (CANCER CENTER ONLY)
Abs Immature Granulocytes: 0 10*3/uL (ref 0.00–0.07)
Basophils Absolute: 0 10*3/uL (ref 0.0–0.1)
Basophils Relative: 1 %
Eosinophils Absolute: 0.1 10*3/uL (ref 0.0–0.5)
Eosinophils Relative: 2 %
HCT: 27.3 % — ABNORMAL LOW (ref 36.0–46.0)
Hemoglobin: 9 g/dL — ABNORMAL LOW (ref 12.0–15.0)
Immature Granulocytes: 0 %
Lymphocytes Relative: 28 %
Lymphs Abs: 0.9 10*3/uL (ref 0.7–4.0)
MCH: 31.5 pg (ref 26.0–34.0)
MCHC: 33 g/dL (ref 30.0–36.0)
MCV: 95.5 fL (ref 80.0–100.0)
Monocytes Absolute: 0.3 10*3/uL (ref 0.1–1.0)
Monocytes Relative: 10 %
Neutro Abs: 1.9 10*3/uL (ref 1.7–7.7)
Neutrophils Relative %: 59 %
Platelet Count: 56 10*3/uL — ABNORMAL LOW (ref 150–400)
RBC: 2.86 MIL/uL — ABNORMAL LOW (ref 3.87–5.11)
RDW: 18.5 % — ABNORMAL HIGH (ref 11.5–15.5)
WBC Count: 3.3 10*3/uL — ABNORMAL LOW (ref 4.0–10.5)
nRBC: 0 % (ref 0.0–0.2)

## 2019-10-13 NOTE — Progress Notes (Signed)
[facility]   [phone] [phone]   Clinic Follow up Note   Patient Care Team: Seward Carol, MD as PCP - General (Internal Medicine) Carlyle Basques, MD as PCP - Infectious Diseases (Infectious Diseases) Binnie Rail, DC as Referring Physician (Chiropractic Medicine) Jonnie Finner, RN as Oncology Nurse Navigator 10/13/2019  CHIEF COMPLAINT: f/u anal cancer, pain   SUMMARY OF ONCOLOGIC HISTORY: Oncology History Overview Note  Cancer Staging Anal cancer ([place]) Staging form: Anus, AJCC 8th Edition - Clinical: Stage IV (cT2, cN0, cM1) - Signed by Truitt Merle, MD on 08/12/2019    Anal cancer ([place])  07/19/2019 Procedure   Colonoscopy by Dr Havery Moros 07/19/19  IMPRESSION - Ulcer noted at the anal canal in posterior midline canal that extends into the distal rectum on digital rectal exam, nodular and somewhat hard to palpation. Endoscopic images show nodular tissue in the area, concerning for malignant ulcer. - The examined portion of the ileum was normal. - One 3 mm polyp at the hepatic flexure, removed with a cold snare. Resected and retrieved. - One 5 mm polyp in the transverse colon, removed with a cold snare. Resected and retrieved. - Diverticulosis in the ascending colon. - The examination was otherwise normal.   07/19/2019 Initial Biopsy   Diagnosis 07/19/19 1. Transverse Colon Polyp, hepatic flexure (2) - TUBULAR ADENOMA (1 OF 3 FRAGMENTS) - BENIGN COLONIC MUCOSA (2 OF 3 FRAGMENTS) - NO HIGH GRADE DYSPLASIA OR MALIGNANCY IDENTIFIED 2. Rectum, biopsy, distal rectal anal canal - SQUAMOUS CELL CARCINOMA - SEE COMMENT   07/26/2019 Initial Diagnosis   Anal cancer ([place])   07/26/2019 Cancer Staging   Staging form: Anus, AJCC 8th Edition - Clinical: Stage IV (cT2, cN1c, cM1) - Signed by Truitt Merle, MD on 08/12/2019   08/04/2019 PET scan   IMPRESSION: Hypermetabolic anal soft tissue mass, consistent with known primary anal carcinoma.     Sub-cm hypermetabolic lymph nodes in posterior perirectal space, right inguinal region, right iliac chain, and gastrohepatic ligament, suspicious for metastatic disease.   Small hypermetabolic left axillary lymph nodes. This would be unusual location for metastatic anal carcinoma. Recommend clinical correlation for possibility the patient has had recent COVID vaccination in the left arm, which could explain this finding.   Multifocal airspace disease in both lower lungs with marked hypermetabolic activity. This favors infectious or inflammatory etiology, and is not typical for pulmonary metastases. Short-term follow-up by chest CT is recommended.   08/15/2019 -  Radiation Therapy   Concurrent ChemoRT with Dr. Lisbeth Renshaw starting 08/15/19. Plan to complete on 10/06/19   08/15/2019 - 09/12/2019 Chemotherapy   Concurrent ChemoRT with Mitomycin and 5FU on week 1 and 5 starting 08/15/19 with week 5 dose on 09/12/19.      CURRENT THERAPY: Concurrent chemoRT with Mitomycin and 5FU on week 1 and 5 for 6 weeks starting 08/15/19.completed RT 10/05/19   INTERVAL HISTORY: Ms. Melley returns for f/u as scheduled. She completed RT on 10/05/19. Her anal/skin pain has worsened, now 9/10. She called yesterday and I suggested to increase oxycodone to 10 mg q4-6 hours which she did. This helps some. Pain worsens with BM. She started pelvic floor PT. She has some dysuria but no frequency or urgency. She drinks a bottle of water per day. Eating adequately. Keeps stools soft, has 1-2 BM per day. Denies rectal bleeding but has some discharge. She has nausea when she takes oxycodone so she takes zofran with pain meds which helps. She has mild DOE, no  cough, chest pain, fever, chills, or other concerns. She is resting more due to the pain.     MEDICAL HISTORY:  Past Medical History:  Diagnosis Date  . Allergic rhinitis   . Biliary colic   . Gall bladder disease \  . Hepatitis C   . Herpes   . HIV infection ([place])   .  Hyperparathyroidism ([place])   . Hypertension   . Insomnia   . Osteoporosis   . Pneumonia    2010    SURGICAL HISTORY: Past Surgical History:  Procedure Laterality Date  . CHOLECYSTECTOMY N/A 01/09/2015   Procedure: LAPAROSCOPIC CHOLECYSTECTOMY;  Surgeon: [name], MD;  Location: [facility];  Service: General;  Laterality: N/A;  . ECTOPIC PREGNANCY SURGERY    . ESOPHAGOGASTRODUODENOSCOPY (EGD) WITH PROPOFOL N/A 09/08/2019   Procedure: ESOPHAGOGASTRODUODENOSCOPY (EGD) WITH PROPOFOL;  Surgeon: Milus Banister, MD;  Location: [facility];  Service: Endoscopy;  Laterality: N/A;  . IR FLUORO GUIDE CV LINE LEFT  08/15/2019  . ORIF TIBIA PLATEAU Right 02/18/2017   Procedure: OPEN REDUCTION INTERNAL FIXATION (ORIF) RIGHT TIBIAL PLATEAU;  Surgeon: Leandrew Koyanagi, MD;  Location: [facility];  Service: Orthopedics;  Laterality: Right;  . SHOULDER SURGERY Right 12/2014  . UPPER ESOPHAGEAL ENDOSCOPIC ULTRASOUND (EUS) N/A 09/08/2019   Procedure: UPPER ESOPHAGEAL ENDOSCOPIC ULTRASOUND (EUS);  Surgeon: Milus Banister, MD;  Location: [facility];  Service: Endoscopy;  Laterality: N/A;    I have reviewed the social history and family history with the patient and they are unchanged from previous note.  ALLERGIES:  is allergic to dapsone; retrovir [zidovudine]; and sulfamethoxazole-trimethoprim.  MEDICATIONS:  Current Outpatient Medications  Medication Sig Dispense Refill  . acetaminophen (TYLENOL) 500 MG tablet Take 1,000 mg by mouth every 6 (six) hours as needed for moderate pain or headache.    . AMBULATORY NON FORMULARY MEDICATION Nitroglycerine ointment 0.125 %  Apply a pea sized amount internally four times daily. 30 g 1  . amLODipine (NORVASC) 10 MG tablet Take 1 tablet (10 mg total) by mouth daily. 90 tablet 3  . diphenoxylate-atropine (LOMOTIL) 2.5-0.025 MG tablet Take 1-2 tablets by mouth 4 (four) times daily as needed for diarrhea or loose stools. 30 tablet 1  . fluconazole (DIFLUCAN) 150 MG tablet  Take 1 tablet (150 mg total) by mouth daily. 2 tablet 0  . ondansetron (ZOFRAN) 8 MG tablet Take 1 tablet (8 mg total) by mouth 2 (two) times daily as needed (Nausea or vomiting). 30 tablet 1  . oxyCODONE (OXY IR/ROXICODONE) 5 MG immediate release tablet Take 1-2 tablets (5-10 mg total) by mouth every 6 (six) hours as needed for severe pain. 60 tablet 0  . potassium chloride SA (KLOR-CON) 20 MEQ tablet Take 1 tablet (20 mEq total) by mouth 2 (two) times daily. 30 tablet 0  . prochlorperazine (COMPAZINE) 10 MG tablet Take 1 tablet (10 mg total) by mouth every 6 (six) hours as needed (Nausea or vomiting). 30 tablet 1  . TRIUMEQ 600-50-300 MG tablet TAKE 1 TABLET BY MOUTH DAILY (Patient taking differently: Take 1 tablet by mouth daily. ) 30 tablet 3  . zolpidem (AMBIEN) 10 MG tablet Take 1 tablet (10 mg total) by mouth at bedtime as needed. (Patient taking differently: Take 10 mg by mouth at bedtime as needed for sleep. ) 30 tablet 1  . ibuprofen (ADVIL) 800 MG tablet TAKE 1 TABLET BY MOUTH EVERY 8 HOURS AS NEEDED 90 tablet 0   No current facility-administered medications for this visit.  PHYSICAL EXAMINATION: ECOG PERFORMANCE STATUS: 1-2  Vitals:   10/13/19 1455 10/13/19 1459  BP: 93/77 102/77  Pulse: (!) 131 98  Resp: 20   Temp: (!) 97.3 F (36.3 C)   SpO2: 97%    Filed Weights   10/13/19 1455  Weight: 156 lb 14.4 oz (71.2 kg)    GENERAL:alert, no distress and comfortable EYES:  sclera clear LUNGS:  normal breathing effort HEART:  lower extremity edema NEURO: alert & oriented x 3 with fluent speech, normal gait RECTAL; mild to moderate hyperpigmentation with right and left perianal ulceration. Rectal discharge.   LABORATORY DATA:  I have reviewed the data as listed CBC Latest Ref Rng & Units 10/13/2019 10/07/2019 10/03/2019  WBC 4.0 - 10.5 K/uL 3.3(L) 4.5 2.7(L)  Hemoglobin 12.0 - 15.0 g/dL 9.0(L) 9.6(L) 9.5(L)  Hematocrit 36.0 - 46.0 % 27.3(L) 28.9(L) 28.4(L)  Platelets 150 -  400 K/uL 56(L) 56(L) 50(L)     CMP Latest Ref Rng & Units 10/13/2019 10/07/2019 10/03/2019  Glucose 70 - 99 mg/dL 120(H) 122(H) 105(H)  BUN 8 - 23 mg/dL 19 11 11   Creatinine 0.44 - 1.00 mg/dL 0.97 0.98 0.89  Sodium 135 - 145 mmol/L 140 140 138  Potassium 3.5 - 5.1 mmol/L 4.2 3.7 3.3(L)  Chloride 98 - 111 mmol/L 107 106 105  CO2 22 - 32 mmol/L 24 25 24   Calcium 8.9 - 10.3 mg/dL 9.3 9.7 9.0  Total Protein 6.5 - 8.1 g/dL 8.2(H) 8.3(H) 7.9  Total Bilirubin 0.3 - 1.2 mg/dL 0.7 0.5 0.5  Alkaline Phos 38 - 126 U/L 68 70 66  AST 15 - 41 U/L 30 22 22   ALT 0 - 44 U/L 15 9 10       RADIOGRAPHIC STUDIES: I have personally reviewed the radiological images as listed and agreed with the findings in the report. No results found.   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 Her biopsy results show Squamous Cell Carcinoma.This is consistent with anal cancer. She has HIV, which is a high risk for anal cancer. -PET from 08/04/19 showedhypermetabolicanal mass, hypermetabolicLNsin posterior perirectal space, right inguinal region, right iliac chain and gastrohepatic ligament suspicious for metastasis. Scan also showed hypermetabolic left axillary LNs, which is unusual metastatic pattern. Shedenies recent injury or injections in the left arm.A biopsy has been recommendedtorule outdistant metastasis.Upon EUS on 09/08/19, the LN was not found so biopsy was not done -Althoughmetastatic disease is not ruled out at this point,Dr. Lisbeth Renshaw is able to cover thegastrohepatic lymph node in the radiation field. -She began concurrent chemoRT with mitomycin and 5FU (week 1 and week 5) on 08/15/19, RT held on days 17 and 18 for  neutropenia ANC 0.3 which recovered and she resumed RT on 09/02/19.  -She began week 5 chemo with 50% dose reduced mitomycin due to pancytopenia from 09/12/19 - 09/16/19. -Ms. Breault appears stable. She completed RT on 10/05/19. She has significant rectal/anal pain and 2 areas of skin breakdown. She can use topical aquaphor or neosporin. Per Bryson Ha avoid lidocain to open skin and hold silvadene due to her sulfa allergy.  -For pain I recommend short term increase oxycodone to 2 tabs q4-6 PRN for severe pain. If pain is mild to moderate she can take 1 oxycodone + acetaminophen. She understands. Hope  to wean asap  -Labs reviewed. CBC shows persistent severe thrombocytopenia PLT 56K. Denies bleeding. ANC normal, Hgb stable 9.0 today  -Plan to repeat CT chest to evaluate her left axillary lymph node and pulmonary nodules, ordered today. -She will return for lab and f/u in 2 weeks.  -I have sent a message to her GI Dr. Havery Moros for anoscopy, if he prefers I will refer her to colorectal surgery for monitoring  2. Pancytopenia, secondary to chemoRT -Nadir plt 44K on day 15 and ANC 0.3 on day 17;anemia remained mild and stable in 10 range -Mitomycin dose reduced 50% for day 28 chemo on 09/12/19 -she has persistent severe pancytopenia in 50K range, denies bleeding.  -Will continue monitoring. We reviewed bleeding precautions  3. Hep C, HIV -She underwentsuccessfulHep C treatment 2 years ago [id number]) -Her HIV was contracted through sex. Her HIV is well controlled. She will continue to f/u ID Dr Baxter Flattery.Last CD4 count was 386 on 02/07/2019. -Compliant with Triumeq -last f/u with Dr. Baxter Flattery 09/29/19   4. Comorbidities: HTN, Hyperparathyroidism, Osteoporosis -Continue medications and f/u with PCP. -BP has been low lately, 102/77 today. She is asymptomatic. She is holding amlodipine for now -I encouraged her to increase po hydration.  -monitoring   5. Social Support  -She is divorced, lives alone  and has 2 adult children who live in [place]  -She is getting bachelors degree in [place] through Online schooling, she plans toresume classes in6/2021, she prefers not to postpone if possible. -She is currently working at [employer] as [employer] and works 8-9 hours every day. She notes her work is flexible to take time off if needed  PLAN: Labs reviewed Thrombocytopenia precautions CT chest, lab, f/u in 2 weeks  Message to GI re: anoscopy and follow up   No problem-specific Assessment & Plan notes found for this encounter.   Orders Placed This Encounter  Procedures  . CT Chest Wo Contrast    Standing Status:   Future    Standing Expiration Date:   10/12/2020    Order Specific Question:   ** REASON FOR EXAM (FREE TEXT)    Answer:   anal cancer s/p chemoRT, f/u axillary adenopathy    Order Specific Question:   Preferred imaging location?    Answer:   [facility]    Order Specific Question:   Radiology Contrast Protocol - do NOT remove file path    Answer:   [email or web address]   All questions were answered. The patient knows to call the clinic with any problems, questions or concerns. No barriers to learning was detected.     Alla Feeling, NP 10/13/19

## 2019-10-14 ENCOUNTER — Other Ambulatory Visit: Payer: Self-pay | Admitting: Nurse Practitioner

## 2019-10-14 ENCOUNTER — Telehealth: Payer: Self-pay | Admitting: Nurse Practitioner

## 2019-10-14 ENCOUNTER — Other Ambulatory Visit: Payer: Self-pay | Admitting: Hematology

## 2019-10-14 DIAGNOSIS — C210 Malignant neoplasm of anus, unspecified: Secondary | ICD-10-CM

## 2019-10-14 MED ORDER — OXYCODONE HCL 5 MG PO TABS: 5.0000 mg | ORAL_TABLET | Freq: Four times a day (QID) | ORAL | 0 refills | Status: DC | PRN

## 2019-10-14 NOTE — Telephone Encounter (Signed)
Scheduled appt per 5/27 los.  Left a vm of the appt date and time.

## 2019-10-20 ENCOUNTER — Telehealth: Payer: Self-pay | Admitting: Nurse Practitioner

## 2019-10-20 NOTE — Telephone Encounter (Signed)
Printed and faxed medical records to [facility] care @ [phone] Release I.d.:[id number]

## 2019-10-26 NOTE — Progress Notes (Signed)
Vanessa Beard   [phone] Fax:([phone]   Clinic Follow up Note   Patient Care Team: Seward Carol, MD as PCP - General (Internal Medicine) Carlyle Basques, MD as PCP - Infectious Diseases (Infectious Diseases) Binnie Rail, DC as Referring Physician (Chiropractic Medicine) Jonnie Finner, RN as Oncology Nurse Navigator Date of Service: 10/27/2019   CHIEF COMPLAINT: F/u anal cancer   SUMMARY OF ONCOLOGIC HISTORY: Oncology History Overview Note  Cancer Staging Anal cancer ([place]) Staging form: Anus, AJCC 8th Edition - Clinical: Stage IV (cT2, cN0, cM1) - Signed by Truitt Merle, MD on 08/12/2019    Anal cancer ([place])  07/19/2019 Procedure   Colonoscopy by Dr Havery Moros 07/19/19  IMPRESSION - Ulcer noted at the anal canal in posterior midline canal that extends into the distal rectum on digital rectal exam, nodular and somewhat hard to palpation. Endoscopic images show nodular tissue in the area, concerning for malignant ulcer. - The examined portion of the ileum was normal. - One 3 mm polyp at the hepatic flexure, removed with a cold snare. Resected and retrieved. - One 5 mm polyp in the transverse colon, removed with a cold snare. Resected and retrieved. - Diverticulosis in the ascending colon. - The examination was otherwise normal.   07/19/2019 Initial Biopsy   Diagnosis 07/19/19 1. Transverse Colon Polyp, hepatic flexure (2) - TUBULAR ADENOMA (1 OF 3 FRAGMENTS) - BENIGN COLONIC MUCOSA (2 OF 3 FRAGMENTS) - NO HIGH GRADE DYSPLASIA OR MALIGNANCY IDENTIFIED 2. Rectum, biopsy, distal rectal anal canal - SQUAMOUS CELL CARCINOMA - SEE COMMENT   07/26/2019 Initial Diagnosis   Anal cancer ([place])   07/26/2019 Cancer Staging   Staging form: Anus, AJCC 8th Edition - Clinical: Stage IV (cT2, cN1c, cM1) - Signed by Truitt Merle, MD on 08/12/2019   08/04/2019 PET scan   IMPRESSION: Hypermetabolic anal soft tissue mass, consistent with known primary anal  carcinoma.   Sub-cm hypermetabolic lymph nodes in posterior perirectal space, right inguinal region, right iliac chain, and gastrohepatic ligament, suspicious for metastatic disease.   Small hypermetabolic left axillary lymph nodes. This would be unusual location for metastatic anal carcinoma. Recommend clinical correlation for possibility the patient has had recent COVID vaccination in the left arm, which could explain this finding.   Multifocal airspace disease in both lower lungs with marked hypermetabolic activity. This favors infectious or inflammatory etiology, and is not typical for pulmonary metastases. Short-term follow-up by chest CT is recommended.   08/15/2019 -  Radiation Therapy   Concurrent ChemoRT with Dr. Lisbeth Renshaw starting 08/15/19. Plan to complete on 10/06/19   08/15/2019 - 09/12/2019 Chemotherapy   Concurrent ChemoRT with Mitomycin and 5FU on week 1 and 5 starting 08/15/19 with week 5 dose on 09/12/19.      CURRENT THERAPY:  Concurrent chemoRT with Mitomycin and 5FU on week 1 and 5 for 6 weeks starting 08/15/19.completed RT 10/05/19   INTERVAL HISTORY: Vanessa Beard returns for f/u as scheduled. She was last seen 10/13/19. She has good days and bad, today is not so good. Starting last night she feels nauseous and weak. She is drinking more. BMs are soft, non bloody. Rarely has leakage. She already completed PT. She has stinging when she starts urine stream, no urgency or frequency or hematuria. This is stable since chemoRT. Anal pain is 7/10 currently but gets up to 9/10 after BM. Oxycodone makes her nauseous so she takes it sparingly, PRN at night. takes ibuprofen 800 mg during the day. This is effective.  Applying neosporin. She has stable exertional dyspnea, unchanged since chemo. No fever or chills, cough, chest pain.    MEDICAL HISTORY:  Past Medical History:  Diagnosis Date   Allergic rhinitis    Biliary colic    Gall bladder disease \   Hepatitis C    Herpes     HIV infection ([place])    Hyperparathyroidism ([place])    Hypertension    Insomnia    Osteoporosis    Pneumonia    2010    SURGICAL HISTORY: Past Surgical History:  Procedure Laterality Date   CHOLECYSTECTOMY N/A 01/09/2015   Procedure: LAPAROSCOPIC CHOLECYSTECTOMY;  Surgeon: [name], MD;  Location: [facility];  Service: General;  Laterality: N/A;   ECTOPIC PREGNANCY SURGERY     ESOPHAGOGASTRODUODENOSCOPY (EGD) WITH PROPOFOL N/A 09/08/2019   Procedure: ESOPHAGOGASTRODUODENOSCOPY (EGD) WITH PROPOFOL;  Surgeon: Milus Banister, MD;  Location: [facility];  Service: Endoscopy;  Laterality: N/A;   IR FLUORO GUIDE CV LINE LEFT  08/15/2019   ORIF TIBIA PLATEAU Right 02/18/2017   Procedure: OPEN REDUCTION INTERNAL FIXATION (ORIF) RIGHT TIBIAL PLATEAU;  Surgeon: Leandrew Koyanagi, MD;  Location: [facility];  Service: Orthopedics;  Laterality: Right;   SHOULDER SURGERY Right 12/2014   UPPER ESOPHAGEAL ENDOSCOPIC ULTRASOUND (EUS) N/A 09/08/2019   Procedure: UPPER ESOPHAGEAL ENDOSCOPIC ULTRASOUND (EUS);  Surgeon: Milus Banister, MD;  Location: [facility];  Service: Endoscopy;  Laterality: N/A;    I have reviewed the social history and family history with the patient and they are unchanged from previous note.  ALLERGIES:  is allergic to dapsone, retrovir [zidovudine], and sulfamethoxazole-trimethoprim.  MEDICATIONS:  Current Outpatient Medications  Medication Sig Dispense Refill   acetaminophen (TYLENOL) 500 MG tablet Take 1,000 mg by mouth every 6 (six) hours as needed for moderate pain or headache.     AMBULATORY NON FORMULARY MEDICATION Nitroglycerine ointment 0.125 %  Apply a pea sized amount internally four times daily. 30 g 1   amLODipine (NORVASC) 10 MG tablet Take 1 tablet (10 mg total) by mouth daily. 90 tablet 3   diphenoxylate-atropine (LOMOTIL) 2.5-0.025 MG tablet Take 1-2 tablets by mouth 4 (four) times daily as needed for diarrhea or loose stools. 30 tablet 1   fluconazole  (DIFLUCAN) 150 MG tablet Take 1 tablet (150 mg total) by mouth daily. 2 tablet 0   ibuprofen (ADVIL) 800 MG tablet TAKE 1 TABLET BY MOUTH EVERY 8 HOURS AS NEEDED 90 tablet 0   ondansetron (ZOFRAN) 8 MG tablet Take 1 tablet (8 mg total) by mouth 2 (two) times daily as needed (Nausea or vomiting). 30 tablet 1   oxyCODONE (OXY IR/ROXICODONE) 5 MG immediate release tablet Take 1-2 tablets (5-10 mg total) by mouth every 6 (six) hours as needed for severe pain. 40 tablet 0   prochlorperazine (COMPAZINE) 10 MG tablet Take 1 tablet (10 mg total) by mouth every 6 (six) hours as needed (Nausea or vomiting). 30 tablet 1   TRIUMEQ 600-50-300 MG tablet TAKE 1 TABLET BY MOUTH DAILY (Patient taking differently: Take 1 tablet by mouth daily. ) 30 tablet 3   zolpidem (AMBIEN) 10 MG tablet Take 1 tablet (10 mg total) by mouth at bedtime as needed. (Patient taking differently: Take 10 mg by mouth at bedtime as needed for sleep. ) 30 tablet 1   nitrofurantoin, macrocrystal-monohydrate, (MACROBID) 100 MG capsule Take 1 capsule (100 mg total) by mouth 2 (two) times daily. 10 capsule 0   potassium chloride SA (KLOR-CON) 20 MEQ tablet Take 1  tablet (20 mEq total) by mouth 2 (two) times daily. 30 tablet 0   No current facility-administered medications for this visit.    PHYSICAL EXAMINATION: ECOG PERFORMANCE STATUS: 1-2  Vitals:   10/27/19 1502  BP: 126/85  Pulse: 96  Resp: 18  Temp: (!) 97.5 F (36.4 C)  SpO2: 97%   Filed Weights   10/27/19 1502  Weight: 155 lb 11.2 oz (70.6 kg)    GENERAL:alert, no distress and comfortable SKIN: no rash to exposed skin  EYES:  sclera clear LUNGS: with normal breathing effort HEART: no lower extremity edema NEURO: alert & oriented x 3 with fluent speech, normal gait Rectal: external exam shows mild hyperpigmentation, some stool in the gluteal cleft. Small shallow left ulcer near the anus  LABORATORY DATA:  I have reviewed the data as listed CBC Latest Ref Rng  & Units 10/27/2019 10/13/2019 10/07/2019  WBC 4.0 - 10.5 K/uL 3.8(L) 3.3(L) 4.5  Hemoglobin 12.0 - 15.0 g/dL 8.7(L) 9.0(L) 9.6(L)  Hematocrit 36 - 46 % 26.8(L) 27.3(L) 28.9(L)  Platelets 150 - 400 K/uL 85(L) 56(L) 56(L)     CMP Latest Ref Rng & Units 10/27/2019 10/13/2019 10/07/2019  Glucose 70 - 99 mg/dL 109(H) 120(H) 122(H)  BUN 8 - 23 mg/dL 11 19 11   Creatinine 0.44 - 1.00 mg/dL 0.84 0.97 0.98  Sodium 135 - 145 mmol/L 140 140 140  Potassium 3.5 - 5.1 mmol/L 3.2(L) 4.2 3.7  Chloride 98 - 111 mmol/L 108 107 106  CO2 22 - 32 mmol/L 23 24 25   Calcium 8.9 - 10.3 mg/dL 9.3 9.3 9.7  Total Protein 6.5 - 8.1 g/dL 8.0 8.2(H) 8.3(H)  Total Bilirubin 0.3 - 1.2 mg/dL 0.6 0.7 0.5  Alkaline Phos 38 - 126 U/L 81 68 70  AST 15 - 41 U/L 28 30 22   ALT 0 - 44 U/L 10 15 9       RADIOGRAPHIC STUDIES: I have personally reviewed the radiological images as listed and agreed with the findings in the report. No results found.   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 Her biopsy results show Squamous Cell Carcinoma.This is consistent with anal cancer. She has HIV, which is a high risk for anal cancer. -PET from 08/04/19 showedhypermetabolicanal mass, hypermetabolicLNsin posterior perirectal space, right inguinal region, right iliac chain and gastrohepatic ligament suspicious for metastasis. Scan also showed hypermetabolic left axillary LNs, which is unusual metastatic pattern. Shedenies recent injury or injections in the left arm.A biopsy has been recommendedtorule outdistant metastasis.Upon EUS on 09/08/19, the LN was not found so biopsy was not done -Althoughmetastatic disease is not ruled out at this point,Dr.  Lisbeth Renshaw is able to cover thegastrohepatic lymph node in the radiation field. -She began concurrent chemoRT with mitomycin and 5FU (week 1 and week 5) on 08/15/19, RT held on days 17 and 18 for neutropenia ANC 0.3 which recovered and she resumed RT on 09/02/19. -She began week 5 chemo with 50% dose reduced mitomycin due to pancytopenia from 09/12/19 - 09/16/19. -She completed RT on 10/05/19. She had significant rectal/anal pain and 2 areas of skin breakdown, one of which has healed. Pain now controlled with NSAIDs. Overall recovering gradually. Continues topicals per RT. Per Bryson Ha, avoid silvadene due to her sulfa allergy.  -Plan to repeat CT  chest to evaluate her left axillary lymph node and pulmonary nodules, has not been scheduled yet -I have referred her to [facility] colorectal surgeon for anal cancer surveillance  -she will return for lab and f/u in 4 weeks   2. Dysuria  -Began with chemoRT, fluctuates  -initial pain with starting stream  -UA today shows bacteria, RBCs, budding yeast. Possibly contaminated but given her symptoms and neutropenia will start empiric antibiotics while culture is pending   3. Pancytopenia, secondary to chemoRT -Nadir plt 44K on day 15 and ANC 0.3 on day 17;anemia remained mild and stable in 10 range -Mitomycin dose reduced 50% for day 28 chemo on 09/12/19 -she has persistent pancytopenia, PLT improving.  -will check iron studies next visit.  -Continue monitoring. We reviewed precautions  4. Hep C, HIV -She underwentsuccessfulHep C treatment 2 years ago (IO0355) -Her HIV was contracted through sex. Her HIV is well controlled. She will continue to f/u ID Dr Baxter Flattery.Last CD4 count was 386 on 02/07/2019. -Compliant with Triumeq  5. Comorbidities: HTN, Hyperparathyroidism, Osteoporosis -Continue medications and f/u with PCP.  -monitoring   6. Social Support  -She is divorced, lives alone and has 2 adult children who live in [place]  -She is getting  bachelors degree in [place] through Online schooling, she plans toresume classes in6/2021, she prefers not to postpone if possible. -She is currently working at [employer] as [employer] and works 8-9 hours every day. She notes her work is flexible to take time off if needed  Disposition:  Vanessa Beard continues to recover slowly from chemoRT which she completed on 10/05/19. She is still having moderate perianal pain previously requiring oxycodone now managed with NSAIDs. She can add tylenol PRN.   For dysuria, UA shows bacteria, RBCs, budding yeast. Given her dysuria, neutropenia will start empiric antibiotics while culture is pending. I prescribed macrobid 100 mg BID x5 days. Will f/u.   She has persistent chemo-induced pancytopenia, the platelet count is improving. We reviewed precautions. CMP shows hypokalemia K 3.2, she will restart oral K once daily.    I will follow up on the referral to colorectal surgery at [facility] for anal cancer suveillance. She will return for lab and follow up in 4 weeks after repeat CT chest. She knows to call sooner if and of her symptoms worsen or fail to improve, or she has new concerns.    Orders Placed This Encounter  Procedures   Urine Culture    Standing Status:   Future    Number of Occurrences:   1    Standing Expiration Date:   10/26/2020   Urinalysis, Complete w Microscopic    Standing Status:   Future    Number of Occurrences:   1    Standing Expiration Date:   10/26/2020   Iron and TIBC    Standing Status:   Standing    Number of Occurrences:   1    Standing Expiration Date:   10/27/2020   Ferritin    Standing Status:   Standing    Number of Occurrences:   1    Standing Expiration Date:   10/27/2020   All questions were answered. The patient knows to call the clinic with any problems, questions or concerns. No barriers to learning was detected. Total encounter time was 30 minutes.      Alla Feeling, NP 10/28/19

## 2019-10-27 ENCOUNTER — Inpatient Hospital Stay: Payer: Medicare HMO | Attending: Hematology | Admitting: Nurse Practitioner

## 2019-10-27 ENCOUNTER — Inpatient Hospital Stay: Payer: Medicare HMO

## 2019-10-27 ENCOUNTER — Encounter: Payer: Self-pay | Admitting: Nurse Practitioner

## 2019-10-27 ENCOUNTER — Other Ambulatory Visit: Payer: Self-pay

## 2019-10-27 VITALS — BP 126/85 | HR 96 | Temp 97.5°F | Resp 18 | Ht 60.0 in | Wt 155.7 lb

## 2019-10-27 DIAGNOSIS — M810 Age-related osteoporosis without current pathological fracture: Secondary | ICD-10-CM | POA: Diagnosis not present

## 2019-10-27 DIAGNOSIS — R0609 Other forms of dyspnea: Secondary | ICD-10-CM | POA: Diagnosis not present

## 2019-10-27 DIAGNOSIS — R300 Dysuria: Secondary | ICD-10-CM

## 2019-10-27 DIAGNOSIS — I10 Essential (primary) hypertension: Secondary | ICD-10-CM | POA: Insufficient documentation

## 2019-10-27 DIAGNOSIS — Z79899 Other long term (current) drug therapy: Secondary | ICD-10-CM | POA: Diagnosis not present

## 2019-10-27 DIAGNOSIS — R69 Illness, unspecified: Secondary | ICD-10-CM | POA: Diagnosis not present

## 2019-10-27 DIAGNOSIS — C210 Malignant neoplasm of anus, unspecified: Secondary | ICD-10-CM | POA: Diagnosis not present

## 2019-10-27 DIAGNOSIS — R531 Weakness: Secondary | ICD-10-CM | POA: Insufficient documentation

## 2019-10-27 DIAGNOSIS — T451X5A Adverse effect of antineoplastic and immunosuppressive drugs, initial encounter: Secondary | ICD-10-CM | POA: Insufficient documentation

## 2019-10-27 DIAGNOSIS — Z21 Asymptomatic human immunodeficiency virus [HIV] infection status: Secondary | ICD-10-CM | POA: Insufficient documentation

## 2019-10-27 DIAGNOSIS — Z881 Allergy status to other antibiotic agents status: Secondary | ICD-10-CM | POA: Diagnosis not present

## 2019-10-27 DIAGNOSIS — D61810 Antineoplastic chemotherapy induced pancytopenia: Secondary | ICD-10-CM | POA: Diagnosis not present

## 2019-10-27 DIAGNOSIS — E213 Hyperparathyroidism, unspecified: Secondary | ICD-10-CM | POA: Insufficient documentation

## 2019-10-27 DIAGNOSIS — C211 Malignant neoplasm of anal canal: Secondary | ICD-10-CM | POA: Diagnosis present

## 2019-10-27 DIAGNOSIS — J984 Other disorders of lung: Secondary | ICD-10-CM | POA: Diagnosis not present

## 2019-10-27 DIAGNOSIS — K5730 Diverticulosis of large intestine without perforation or abscess without bleeding: Secondary | ICD-10-CM | POA: Diagnosis not present

## 2019-10-27 DIAGNOSIS — B1920 Unspecified viral hepatitis C without hepatic coma: Secondary | ICD-10-CM | POA: Diagnosis not present

## 2019-10-27 LAB — URINALYSIS, COMPLETE (UACMP) WITH MICROSCOPIC
Glucose, UA: NEGATIVE mg/dL
Hgb urine dipstick: NEGATIVE
Ketones, ur: 5 mg/dL — AB
Nitrite: NEGATIVE
Protein, ur: 100 mg/dL — AB
Specific Gravity, Urine: 1.034 — ABNORMAL HIGH (ref 1.005–1.030)
WBC, UA: >50 WBC/hpf — ABNORMAL HIGH (ref 0–5)
pH: 5 (ref 5.0–8.0)

## 2019-10-27 LAB — CMP (CANCER CENTER ONLY)
ALT: 10 U/L (ref 0–44)
AST: 28 U/L (ref 15–41)
Albumin: 3.2 g/dL — ABNORMAL LOW (ref 3.5–5.0)
Alkaline Phosphatase: 81 U/L (ref 38–126)
Anion gap: 9 (ref 5–15)
BUN: 11 mg/dL (ref 8–23)
CO2: 23 mmol/L (ref 22–32)
Calcium: 9.3 mg/dL (ref 8.9–10.3)
Chloride: 108 mmol/L (ref 98–111)
Creatinine: 0.84 mg/dL (ref 0.44–1.00)
GFR, Est AFR Am: >60 mL/min (ref >60)
GFR, Estimated: >60 mL/min (ref >60)
Glucose, Bld: 109 mg/dL — ABNORMAL HIGH (ref 70–99)
Potassium: 3.2 mmol/L — ABNORMAL LOW (ref 3.5–5.1)
Sodium: 140 mmol/L (ref 135–145)
Total Bilirubin: 0.6 mg/dL (ref 0.3–1.2)
Total Protein: 8 g/dL (ref 6.5–8.1)

## 2019-10-27 LAB — CBC WITH DIFFERENTIAL (CANCER CENTER ONLY)
Abs Immature Granulocytes: 0 10*3/uL (ref 0.00–0.07)
Basophils Absolute: 0 10*3/uL (ref 0.0–0.1)
Basophils Relative: 0 %
Eosinophils Absolute: 0 10*3/uL (ref 0.0–0.5)
Eosinophils Relative: 1 %
HCT: 26.8 % — ABNORMAL LOW (ref 36.0–46.0)
Hemoglobin: 8.7 g/dL — ABNORMAL LOW (ref 12.0–15.0)
Immature Granulocytes: 0 %
Lymphocytes Relative: 55 %
Lymphs Abs: 2.1 10*3/uL (ref 0.7–4.0)
MCH: 32.3 pg (ref 26.0–34.0)
MCHC: 32.5 g/dL (ref 30.0–36.0)
MCV: 99.6 fL (ref 80.0–100.0)
Monocytes Absolute: 0.4 10*3/uL (ref 0.1–1.0)
Monocytes Relative: 9 %
Neutro Abs: 1.3 10*3/uL — ABNORMAL LOW (ref 1.7–7.7)
Neutrophils Relative %: 35 %
Platelet Count: 85 10*3/uL — ABNORMAL LOW (ref 150–400)
RBC: 2.69 MIL/uL — ABNORMAL LOW (ref 3.87–5.11)
RDW: 19.8 % — ABNORMAL HIGH (ref 11.5–15.5)
WBC Count: 3.8 10*3/uL — ABNORMAL LOW (ref 4.0–10.5)
nRBC: 0 % (ref 0.0–0.2)

## 2019-10-27 MED ORDER — NITROFURANTOIN MONOHYD MACRO 100 MG PO CAPS: 100.0000 mg | ORAL_CAPSULE | Freq: Two times a day (BID) | ORAL | 0 refills | Status: DC

## 2019-10-28 ENCOUNTER — Encounter: Payer: Self-pay | Admitting: Nurse Practitioner

## 2019-10-28 ENCOUNTER — Telehealth: Payer: Self-pay

## 2019-10-28 ENCOUNTER — Telehealth: Payer: Self-pay | Admitting: Nurse Practitioner

## 2019-10-28 NOTE — Telephone Encounter (Signed)
vm left for Vanessa Beard to return my call

## 2019-10-28 NOTE — Telephone Encounter (Signed)
Scheduled appt per 6/10 los.  Spoke with pt and she is aware of the appt date and time. 

## 2019-10-29 LAB — URINE CULTURE: Culture: 20000 — AB

## 2019-11-01 ENCOUNTER — Telehealth: Payer: Self-pay

## 2019-11-01 ENCOUNTER — Emergency Department (HOSPITAL_COMMUNITY)
Admission: EM | Admit: 2019-11-01 | Discharge: 2019-11-02 | Disposition: A | Discharge status: Home / Self Care | Payer: Medicare HMO | Attending: Emergency Medicine | Admitting: Emergency Medicine

## 2019-11-01 ENCOUNTER — Other Ambulatory Visit: Payer: Self-pay

## 2019-11-01 ENCOUNTER — Encounter (HOSPITAL_COMMUNITY): Payer: Self-pay

## 2019-11-01 DIAGNOSIS — Z21 Asymptomatic human immunodeficiency virus [HIV] infection status: Secondary | ICD-10-CM | POA: Insufficient documentation

## 2019-11-01 DIAGNOSIS — R1010 Upper abdominal pain, unspecified: Secondary | ICD-10-CM

## 2019-11-01 DIAGNOSIS — R1013 Epigastric pain: Secondary | ICD-10-CM | POA: Insufficient documentation

## 2019-11-01 DIAGNOSIS — N390 Urinary tract infection, site not specified: Secondary | ICD-10-CM | POA: Insufficient documentation

## 2019-11-01 DIAGNOSIS — J439 Emphysema, unspecified: Secondary | ICD-10-CM | POA: Diagnosis not present

## 2019-11-01 DIAGNOSIS — R110 Nausea: Secondary | ICD-10-CM | POA: Diagnosis not present

## 2019-11-01 DIAGNOSIS — K429 Umbilical hernia without obstruction or gangrene: Secondary | ICD-10-CM | POA: Diagnosis not present

## 2019-11-01 DIAGNOSIS — R69 Illness, unspecified: Secondary | ICD-10-CM | POA: Diagnosis not present

## 2019-11-01 DIAGNOSIS — Z79899 Other long term (current) drug therapy: Secondary | ICD-10-CM | POA: Insufficient documentation

## 2019-11-01 DIAGNOSIS — R1011 Right upper quadrant pain: Secondary | ICD-10-CM | POA: Diagnosis not present

## 2019-11-01 DIAGNOSIS — I10 Essential (primary) hypertension: Secondary | ICD-10-CM | POA: Diagnosis not present

## 2019-11-01 DIAGNOSIS — J479 Bronchiectasis, uncomplicated: Secondary | ICD-10-CM | POA: Diagnosis not present

## 2019-11-01 NOTE — ED Triage Notes (Signed)
Pt complains of left side pain that started on Sunday, pt has just finished radiation two weeks ago for rectal cancer, she states that she has two ulcers on her buttock that are still slightly opened

## 2019-11-01 NOTE — Telephone Encounter (Signed)
I called Vanessa Beard to let her know about her CT appt .  She told me she was having abd pain and nausea from "the pills I am taking"   She states the pain is from her pain medication that was prescribed 5/28.  She is moaning as we speak.  She is having difficulty walking up the stairs. She is having daily bm's that are normal.  I reviewed with Vanessa Rue, NP.  Vanessa Beard is to stop taking the oxycodone.  She is to alternated tylenol and ibuprofen for the pain.  I also suggested to Vanessa Beard using a heating pad.  She has zofran tablets for nausea/vomiting.  She has an appt for lab and [facility] tomorrow.  I reviewed the times with Vanessa Beard.  I instructed her to go to the ED if the pain became worse.  She verbalized understanding.

## 2019-11-02 ENCOUNTER — Ambulatory Visit: Payer: Medicare HMO | Admitting: Internal Medicine

## 2019-11-02 ENCOUNTER — Telehealth: Payer: Self-pay | Admitting: *Deleted

## 2019-11-02 ENCOUNTER — Inpatient Hospital Stay: Payer: Medicare HMO

## 2019-11-02 ENCOUNTER — Inpatient Hospital Stay: Payer: Medicare HMO | Admitting: Medical

## 2019-11-02 ENCOUNTER — Emergency Department (HOSPITAL_COMMUNITY): Payer: Medicare HMO

## 2019-11-02 ENCOUNTER — Encounter (HOSPITAL_COMMUNITY): Payer: Self-pay

## 2019-11-02 DIAGNOSIS — J479 Bronchiectasis, uncomplicated: Secondary | ICD-10-CM | POA: Diagnosis not present

## 2019-11-02 DIAGNOSIS — J439 Emphysema, unspecified: Secondary | ICD-10-CM | POA: Diagnosis not present

## 2019-11-02 DIAGNOSIS — K429 Umbilical hernia without obstruction or gangrene: Secondary | ICD-10-CM | POA: Diagnosis not present

## 2019-11-02 LAB — I-STAT CHEM 8, ED
BUN: 11 mg/dL (ref 8–23)
Calcium, Ion: 1.28 mmol/L (ref 1.15–1.40)
Chloride: 106 mmol/L (ref 98–111)
Creatinine, Ser: 0.8 mg/dL (ref 0.44–1.00)
Glucose, Bld: 89 mg/dL (ref 70–99)
HCT: 27 % — ABNORMAL LOW (ref 36.0–46.0)
Hemoglobin: 9.2 g/dL — ABNORMAL LOW (ref 12.0–15.0)
Potassium: 3.6 mmol/L (ref 3.5–5.1)
Sodium: 141 mmol/L (ref 135–145)
TCO2: 23 mmol/L (ref 22–32)

## 2019-11-02 LAB — CBC WITH DIFFERENTIAL/PLATELET
Abs Immature Granulocytes: 0.01 10*3/uL (ref 0.00–0.07)
Basophils Absolute: 0 10*3/uL (ref 0.0–0.1)
Basophils Relative: 0 %
Eosinophils Absolute: 0.1 10*3/uL (ref 0.0–0.5)
Eosinophils Relative: 4 %
HCT: 27.3 % — ABNORMAL LOW (ref 36.0–46.0)
Hemoglobin: 8.8 g/dL — ABNORMAL LOW (ref 12.0–15.0)
Immature Granulocytes: 0 %
Lymphocytes Relative: 45 %
Lymphs Abs: 1.7 10*3/uL (ref 0.7–4.0)
MCH: 33.1 pg (ref 26.0–34.0)
MCHC: 32.2 g/dL (ref 30.0–36.0)
MCV: 102.6 fL — ABNORMAL HIGH (ref 80.0–100.0)
Monocytes Absolute: 0.4 10*3/uL (ref 0.1–1.0)
Monocytes Relative: 11 %
Neutro Abs: 1.5 10*3/uL — ABNORMAL LOW (ref 1.7–7.7)
Neutrophils Relative %: 40 %
Platelets: 121 10*3/uL — ABNORMAL LOW (ref 150–400)
RBC: 2.66 MIL/uL — ABNORMAL LOW (ref 3.87–5.11)
RDW: 20.1 % — ABNORMAL HIGH (ref 11.5–15.5)
WBC: 3.8 10*3/uL — ABNORMAL LOW (ref 4.0–10.5)
nRBC: 0 % (ref 0.0–0.2)

## 2019-11-02 LAB — COMPREHENSIVE METABOLIC PANEL
ALT: 12 U/L (ref 0–44)
AST: 26 U/L (ref 15–41)
Albumin: 3.5 g/dL (ref 3.5–5.0)
Alkaline Phosphatase: 76 U/L (ref 38–126)
Anion gap: 7 (ref 5–15)
BUN: 12 mg/dL (ref 8–23)
CO2: 22 mmol/L (ref 22–32)
Calcium: 9.1 mg/dL (ref 8.9–10.3)
Chloride: 108 mmol/L (ref 98–111)
Creatinine, Ser: 0.78 mg/dL (ref 0.44–1.00)
GFR calc Af Amer: >60 mL/min (ref >60)
GFR calc non Af Amer: >60 mL/min (ref >60)
Glucose, Bld: 96 mg/dL (ref 70–99)
Potassium: 3.6 mmol/L (ref 3.5–5.1)
Sodium: 137 mmol/L (ref 135–145)
Total Bilirubin: 0.8 mg/dL (ref 0.3–1.2)
Total Protein: 8.4 g/dL — ABNORMAL HIGH (ref 6.5–8.1)

## 2019-11-02 LAB — URINALYSIS, ROUTINE W REFLEX MICROSCOPIC
Bilirubin Urine: NEGATIVE
Glucose, UA: NEGATIVE mg/dL
Hgb urine dipstick: NEGATIVE
Ketones, ur: NEGATIVE mg/dL
Nitrite: NEGATIVE
Protein, ur: 30 mg/dL — AB
Specific Gravity, Urine: 1.02 (ref 1.005–1.030)
pH: 6 (ref 5.0–8.0)

## 2019-11-02 LAB — LACTIC ACID, PLASMA: Lactic Acid, Venous: 1.3 mmol/L (ref 0.5–1.9)

## 2019-11-02 LAB — I-STAT BETA HCG BLOOD, ED (MC, WL, AP ONLY): I-stat hCG, quantitative: <5 m[IU]/mL (ref <5)

## 2019-11-02 LAB — LIPASE, BLOOD: Lipase: 25 U/L (ref 11–51)

## 2019-11-02 MED ORDER — SODIUM CHLORIDE (PF) 0.9 % IJ SOLN
INTRAMUSCULAR | Status: AC
  Filled 2019-11-02: qty 50

## 2019-11-02 MED ORDER — HYDROMORPHONE HCL 1 MG/ML IJ SOLN
1.0000 mg | Freq: Once | INTRAMUSCULAR | Status: AC
  Administered 2019-11-02: 1 mg via INTRAVENOUS
  Filled 2019-11-02: qty 1

## 2019-11-02 MED ORDER — IOHEXOL 300 MG/ML  SOLN
100.0000 mL | Freq: Once | INTRAMUSCULAR | Status: AC | PRN
  Administered 2019-11-02: 100 mL via INTRAVENOUS

## 2019-11-02 MED ORDER — SODIUM CHLORIDE 0.9 % IV BOLUS (SEPSIS)
1000.0000 mL | Freq: Once | INTRAVENOUS | Status: AC
  Administered 2019-11-02: 1000 mL via INTRAVENOUS

## 2019-11-02 MED ORDER — LIDOCAINE VISCOUS HCL 2 % MT SOLN
15.0000 mL | Freq: Once | OROMUCOSAL | Status: AC
  Administered 2019-11-02: 15 mL via ORAL
  Filled 2019-11-02: qty 15

## 2019-11-02 MED ORDER — SODIUM CHLORIDE 0.9 % IV SOLN
1000.0000 mL | INTRAVENOUS | Status: DC
  Administered 2019-11-02: 1000 mL via INTRAVENOUS

## 2019-11-02 MED ORDER — ALUM & MAG HYDROXIDE-SIMETH 200-200-20 MG/5ML PO SUSP
30.0000 mL | Freq: Once | ORAL | Status: AC
  Administered 2019-11-02: 30 mL via ORAL
  Filled 2019-11-02: qty 30

## 2019-11-02 MED ORDER — ONDANSETRON HCL 4 MG/2ML IJ SOLN
4.0000 mg | Freq: Once | INTRAMUSCULAR | Status: AC
  Administered 2019-11-02: 4 mg via INTRAVENOUS
  Filled 2019-11-02: qty 2

## 2019-11-02 MED ORDER — CEPHALEXIN 500 MG PO CAPS
500.0000 mg | ORAL_CAPSULE | Freq: Two times a day (BID) | ORAL | 0 refills | Status: AC
Start: 2019-11-02 — End: 2019-11-07

## 2019-11-02 MED ORDER — ONDANSETRON 4 MG PO TBDP: 4.0000 mg | ORAL_TABLET | Freq: Three times a day (TID) | ORAL | 0 refills | Status: AC | PRN

## 2019-11-02 MED ORDER — CEPHALEXIN 500 MG PO CAPS
500.0000 mg | ORAL_CAPSULE | Freq: Once | ORAL | Status: AC
  Administered 2019-11-02: 500 mg via ORAL
  Filled 2019-11-02: qty 1

## 2019-11-02 MED ORDER — HYOSCYAMINE SULFATE 0.125 MG SL SUBL
0.2500 mg | SUBLINGUAL_TABLET | Freq: Once | SUBLINGUAL | Status: AC
  Administered 2019-11-02: 0.25 mg via SUBLINGUAL
  Filled 2019-11-02: qty 2

## 2019-11-02 NOTE — Telephone Encounter (Signed)
Went to ER last night and had CT scan C/A/P and was put on keflex for UTI. Asking if she still needs to have the CT chest and labs ordered by Cira Rue, NP? Next appointment is 11/14/19 for Lab/OV.

## 2019-11-02 NOTE — Progress Notes (Signed)
[place]   Telephone:([phone]) [phone] Fax:(336) [phone]   Clinic Follow up Note   Patient Care Team: Seward Carol, MD as PCP - General (Internal Medicine) Carlyle Basques, MD as PCP - Infectious Diseases (Infectious Diseases) Binnie Rail, DC as Referring Physician (Chiropractic Medicine) Jonnie Finner, RN as Oncology Nurse Navigator  Date of Service:  11/14/2019  CHIEF COMPLAINT: F/u ofanal cancer  SUMMARY OF ONCOLOGIC HISTORY: Oncology History Overview Note  Cancer Staging Anal cancer ([place]) Staging form: Anus, AJCC 8th Edition - Clinical: Stage IV (cT2, cN0, cM1) - Signed by Truitt Merle, MD on 08/12/2019    Anal cancer ([place])  07/19/2019 Procedure   Colonoscopy by Dr Havery Moros 07/19/19  IMPRESSION - Ulcer noted at the anal canal in posterior midline canal that extends into the distal rectum on digital rectal exam, nodular and somewhat hard to palpation. Endoscopic images show nodular tissue in the area, concerning for malignant ulcer. - The examined portion of the ileum was normal. - One 3 mm polyp at the hepatic flexure, removed with a cold snare. Resected and retrieved. - One 5 mm polyp in the transverse colon, removed with a cold snare. Resected and retrieved. - Diverticulosis in the ascending colon. - The examination was otherwise normal.   07/19/2019 Initial Biopsy   Diagnosis 07/19/19 1. Transverse Colon Polyp, hepatic flexure (2) - TUBULAR ADENOMA (1 OF 3 FRAGMENTS) - BENIGN COLONIC MUCOSA (2 OF 3 FRAGMENTS) - NO HIGH GRADE DYSPLASIA OR MALIGNANCY IDENTIFIED 2. Rectum, biopsy, distal rectal anal canal - SQUAMOUS CELL CARCINOMA - SEE COMMENT   07/26/2019 Initial Diagnosis   Anal cancer ([place])   07/26/2019 Cancer Staging   Staging form: Anus, AJCC 8th Edition - Clinical: Stage IV (cT2, cN1c, cM1) - Signed by Truitt Merle, MD on 08/12/2019   08/04/2019 PET scan   IMPRESSION: Hypermetabolic anal soft tissue mass, consistent with known primary anal  carcinoma.   Sub-cm hypermetabolic lymph nodes in posterior perirectal space, right inguinal region, right iliac chain, and gastrohepatic ligament, suspicious for metastatic disease.   Small hypermetabolic left axillary lymph nodes. This would be unusual location for metastatic anal carcinoma. Recommend clinical correlation for possibility the patient has had recent COVID vaccination in the left arm, which could explain this finding.   Multifocal airspace disease in both lower lungs with marked hypermetabolic activity. This favors infectious or inflammatory etiology, and is not typical for pulmonary metastases. Short-term follow-up by chest CT is recommended.   08/15/2019 - 10/05/2019 Radiation Therapy   Concurrent ChemoRT with Dr. Lisbeth Renshaw starting 08/15/19-10/05/19   08/15/2019 - 09/12/2019 Chemotherapy   Concurrent ChemoRT with Mitomycin and 5FU on week 1 and 5 starting 08/15/19 with week 5 dose on 09/12/19.    11/02/2019 Imaging   CT CAP w contrast  IMPRESSION: No acute findings in the chest, abdomen or pelvis.   Biapical and bibasilar scarring. Bronchiectasis and paraseptal emphysema in the lung bases.   Tortuous aorta.   Small umbilical hernia containing fat.      CURRENT THERAPY:  Surveillance   INTERVAL HISTORY:  Vanessa Beard is here for a follow up. She presents to the clinic alone. She notes she is doing better. She feels she has recovered some from treatment. She has anal pain still, 7/10. She is on oxycodone once during the day and 2 tabs at night. She notes it makes her drowsy but does not really help her pain. She notes Tylenol does not help. She tried ibuprofen but took too many  and caused stomach irritation and pain so she stopped.     REVIEW OF SYSTEMS:   Constitutional: Denies fevers, chills or abnormal weight loss (+) Fatigue  Eyes: Denies blurriness of vision Ears, nose, mouth, throat, and face: Denies mucositis or sore throat Respiratory: Denies cough,  dyspnea or wheezes Cardiovascular: Denies palpitation, chest discomfort or lower extremity swelling Gastrointestinal:  Denies nausea, heartburn or change in bowel habits (+) Anal pain  Skin: Denies abnormal skin rashes Lymphatics: Denies new lymphadenopathy or easy bruising Neurological:Denies numbness, tingling or new weaknesses Behavioral/Psych: Mood is stable, no new changes  All other systems were reviewed with the patient and are negative.  MEDICAL HISTORY:  Past Medical History:  Diagnosis Date  . Allergic rhinitis   . Biliary colic   . Gall bladder disease \  . Hepatitis C   . Herpes   . HIV infection ([place])   . Hyperparathyroidism ([place])   . Hypertension   . Insomnia   . Osteoporosis   . Pneumonia    2010    SURGICAL HISTORY: Past Surgical History:  Procedure Laterality Date  . CHOLECYSTECTOMY N/A 01/09/2015   Procedure: LAPAROSCOPIC CHOLECYSTECTOMY;  Surgeon: [name], MD;  Location: [facility];  Service: General;  Laterality: N/A;  . ECTOPIC PREGNANCY SURGERY    . ESOPHAGOGASTRODUODENOSCOPY (EGD) WITH PROPOFOL N/A 09/08/2019   Procedure: ESOPHAGOGASTRODUODENOSCOPY (EGD) WITH PROPOFOL;  Surgeon: Milus Banister, MD;  Location: [facility];  Service: Endoscopy;  Laterality: N/A;  . IR FLUORO GUIDE CV LINE LEFT  08/15/2019  . ORIF TIBIA PLATEAU Right 02/18/2017   Procedure: OPEN REDUCTION INTERNAL FIXATION (ORIF) RIGHT TIBIAL PLATEAU;  Surgeon: Leandrew Koyanagi, MD;  Location: [facility];  Service: Orthopedics;  Laterality: Right;  . SHOULDER SURGERY Right 12/2014  . UPPER ESOPHAGEAL ENDOSCOPIC ULTRASOUND (EUS) N/A 09/08/2019   Procedure: UPPER ESOPHAGEAL ENDOSCOPIC ULTRASOUND (EUS);  Surgeon: Milus Banister, MD;  Location: [facility];  Service: Endoscopy;  Laterality: N/A;    I have reviewed the social history and family history with the patient and they are unchanged from previous note.  ALLERGIES:  is allergic to dapsone, retrovir [zidovudine], and  sulfamethoxazole-trimethoprim.  MEDICATIONS:  Current Outpatient Medications  Medication Sig Dispense Refill  . acetaminophen (TYLENOL) 500 MG tablet Take 1,000 mg by mouth every 6 (six) hours as needed for moderate pain or headache.    . AMBULATORY NON FORMULARY MEDICATION Nitroglycerine ointment 0.125 %  Apply a pea sized amount internally four times daily. (Patient not taking: Reported on 11/08/2019) 30 g 1  . amLODipine (NORVASC) 10 MG tablet Take 1 tablet (10 mg total) by mouth daily. (Patient not taking: Reported on 11/08/2019) 90 tablet 3  . diphenoxylate-atropine (LOMOTIL) 2.5-0.025 MG tablet Take 1-2 tablets by mouth 4 (four) times daily as needed for diarrhea or loose stools. (Patient not taking: Reported on 11/02/2019) 30 tablet 1  . fluconazole (DIFLUCAN) 150 MG tablet Take 1 tablet (150 mg total) by mouth daily. (Patient not taking: Reported on 11/02/2019) 2 tablet 0  . gabapentin (NEURONTIN) 300 MG capsule Take 1 capsule (300 mg total) by mouth 2 (two) times daily. 60 capsule 1  . ibuprofen (ADVIL) 800 MG tablet TAKE 1 TABLET BY MOUTH EVERY 8 HOURS AS NEEDED (Patient not taking: Reported on 11/08/2019) 90 tablet 0  . nitrofurantoin, macrocrystal-monohydrate, (MACROBID) 100 MG capsule Take 1 capsule (100 mg total) by mouth 2 (two) times daily. (Patient not taking: Reported on 11/02/2019) 10 capsule 0  . ondansetron (ZOFRAN) 8 MG tablet  Take 1 tablet (8 mg total) by mouth 2 (two) times daily as needed (Nausea or vomiting). 30 tablet 1  . oxyCODONE (OXY IR/ROXICODONE) 5 MG immediate release tablet Take 1 tablet (5 mg total) by mouth every 6 (six) hours as needed for severe pain. 40 tablet 0  . potassium chloride SA (KLOR-CON) 20 MEQ tablet Take 1 tablet (20 mEq total) by mouth 2 (two) times daily. (Patient not taking: Reported on 11/08/2019) 30 tablet 0  . prochlorperazine (COMPAZINE) 10 MG tablet Take 1 tablet (10 mg total) by mouth every 6 (six) hours as needed (Nausea or vomiting). (Patient  not taking: Reported on 11/08/2019) 30 tablet 1  . TRIUMEQ 600-50-300 MG tablet TAKE 1 TABLET BY MOUTH DAILY (Patient taking differently: Take 1 tablet by mouth daily. ) 30 tablet 3  . valACYclovir (VALTREX) 1000 MG tablet Take 1 tablet (1,000 mg total) by mouth 3 (three) times daily. 30 tablet 1  . zolpidem (AMBIEN) 10 MG tablet Take 1 tablet (10 mg total) by mouth at bedtime as needed. (Patient taking differently: Take 10 mg by mouth at bedtime as needed for sleep. ) 30 tablet 1   No current facility-administered medications for this visit.    PHYSICAL EXAMINATION: ECOG PERFORMANCE STATUS: 1 - Symptomatic but completely ambulatory  Vitals:   11/14/19 1209  BP: 124/90  Pulse: 100  Resp: 20  Temp: 97.7 F (36.5 C)  SpO2: 98%   Filed Weights   11/14/19 1209  Weight: 152 lb 1.6 oz (69 kg)    GENERAL:alert, no distress and comfortable SKIN: skin color, texture, turgor are normal, no rashes or significant lesions EYES: normal, Conjunctiva are pink and non-injected, sclera clear  NECK: supple, thyroid normal size, non-tender, without nodularity LYMPH:  no palpable lymphadenopathy in the cervical, axillary  LUNGS: clear to auscultation and percussion with normal breathing effort HEART: regular rate & rhythm and no murmurs and no lower extremity edema ABDOMEN:abdomen soft, non-tender and normal bowel sounds Musculoskeletal:no cyanosis of digits and no clubbing  NEURO: alert & oriented x 3 with fluent speech, no focal motor/sensory deficits RECTAL: (+) External: small perianal skin ulcers, 1 on right and 2 of left (+) Small right anal nodule with no blood on glove. Mild anal discharge.  LABORATORY DATA:  I have reviewed the data as listed CBC Latest Ref Rng & Units 11/14/2019 11/02/2019 11/02/2019  WBC 4.0 - 10.5 K/uL 4.6 - 3.8(L)  Hemoglobin 12.0 - 15.0 g/dL 9.4(L) 9.2(L) 8.8(L)  Hematocrit 36 - 46 % 28.8(L) 27.0(L) 27.3(L)  Platelets 150 - 400 K/uL 154 - 121(L)     CMP Latest Ref  Rng & Units 11/02/2019 11/02/2019 10/27/2019  Glucose 70 - 99 mg/dL 89 96 109(H)  BUN 8 - 23 mg/dL 11 12 11   Creatinine 0.44 - 1.00 mg/dL 0.80 0.78 0.84  Sodium 135 - 145 mmol/L 141 137 140  Potassium 3.5 - 5.1 mmol/L 3.6 3.6 3.2(L)  Chloride 98 - 111 mmol/L 106 108 108  CO2 22 - 32 mmol/L - 22 23  Calcium 8.9 - 10.3 mg/dL - 9.1 9.3  Total Protein 6.5 - 8.1 g/dL - 8.4(H) 8.0  Total Bilirubin 0.3 - 1.2 mg/dL - 0.8 0.6  Alkaline Phos 38 - 126 U/L - 76 81  AST 15 - 41 U/L - 26 28  ALT 0 - 44 U/L - 12 10      RADIOGRAPHIC STUDIES: I have personally reviewed the radiological images as listed and agreed with the findings in the  report. No results found.   ASSESSMENT & PLAN:  Vanessa Beard is a 69 y.o. 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 Her biopsy results show Squamous Cell Carcinoma.This is consistent with anal cancer. She has HIV, which is a high risk for anal cancer. -HerPET from 08/04/19 showedhypermetabolicanal mass, hypermetabolicLNsin posterior perirectal space, right inguinal region, right iliac chain and gastrohepatic ligament suspicious for metastasis. Scan also showed hypermetabolic left axillary LNs, which is unusual metastatic pattern. Shedenies recent injury or injections in the left arm. -She underwent EUSfirst on 09/08/19 with Dr Ardis Hughs. Her Axillary LN was not found so no biopsy was done. -Althoughmetastatic disease is not ruled out at this point,Dr. Lisbeth Renshaw is able to cover thegastrohepatic lymph node in the radiation field.She underwent 6 weeks of concurrent ChemoRT withMitomycin and 5FU which is the standard therapyfordefinitive anal cancer therapy.She completed on 10/05/19.  -She continues to recover from treatment. She  has residual fatigue and continues to have anal pain. Due to stomach irritation from ibuprofen use she went to ED. Her CT CAP there on 11/02/19 showed NED. I reviewed pain medication use with her.  -Labs reviewed with her. Physical exam shows external perianal skin ulcers, healing and small nodule of her right anal canal palpated. I discussed it is too early to indicate full response of treatment.  -If nodule still palpated on next exam, may refer her for anal scope and biopsy by surgeon. I discussed if she has residual disease, curative surgery would be recommended at that time.  -F/u in 6 weeks with PET scan.   2. Hep C, HIV -She underwentsuccessfulHep C treatment 2 years ago ([id number]) -Her HIV was contracted through sex. Her HIV is well controlled. She will continue to f/u ID Dr Graylon Good.Last CD4 count was 386 on 02/07/2019.  3. Comorbidities: HTN, Hyperparathyroidism, Osteoporosis -Continue medications and f/u with PCP.   4. Social Support  -She is divorced, lives alone and has 2 adult children who live in [place]  -She is getting bachelors degree in Criminal Justice through Online schooling, she plans to complete in 10/2019 but may postpone her final classes.  -She is currently working at [employer] as [employer] and works 8-9 hours every day. She notes her work is flexible to take time off if needed.  5. Anal pain  secondary to chemoRT, completed now.  -She is on oxycodone 5mg  TID. She notes it makes her drowsy but does not really help her pain. She notes Tylenol does not help. She tried ibuprofen but took too many and caused stomach irritation and pain so she stopped.  -Given her pain is nerve related I will call in Gabapentin 300mg  nightly so she can use less oxycodone and possibly stop it. If causes too much drowsiness, will reduce dose. If tolerable she can use 1-2 tabs during the day if needed.   6. Anemia  -Secondary to chemoRT, completed now.  -Hg 9.4 today  (11/14/19). I discussed she can take OTC vitamin.  PLAN: -I called in Gabapentin 300mg  today  -F/u in 6 weeks with lab and PET scan a few days before.    No problem-specific Assessment & Plan notes found for this encounter.   Orders Placed This Encounter  Procedures  . NM PET Image Restag (PS) Skull Base To Thigh  Standing Status:   Future    Standing Expiration Date:   11/13/2020    Order Specific Question:   If indicated for the ordered procedure, I authorize the administration of a radiopharmaceutical per Radiology protocol    Answer:   Yes    Order Specific Question:   Preferred imaging location?    Answer:   [facility]    Order Specific Question:   Radiology Contrast Protocol - do NOT remove file path    Answer:   [email or web address]   All questions were answered. The patient knows to call the clinic with any problems, questions or concerns. No barriers to learning was detected. The total time spent in the appointment was 30 minutes.     Truitt Merle, MD 11/14/2019   I, Joslyn Devon, am acting as scribe for Truitt Merle, MD.   I have reviewed the above documentation for accuracy and completeness, and I agree with the above.

## 2019-11-02 NOTE — ED Provider Notes (Signed)
[facility] DEPT Provider Note  CSN: [id number] Arrival date & time: 11/01/19 2209  Chief Complaint(s) No chief complaint on file.  HPI Vanessa Beard is a 69 y.o. female   The history is provided by the patient.  Abdominal Pain Pain location:  Epigastric and RUQ Pain quality: cramping   Pain radiates to:  Does not radiate Pain severity:  Moderate Onset quality:  Gradual Duration:  3 days Timing:  Intermittent Progression:  Waxing and waning Chronicity:  New Context comment:  Was started on macrobid for UTI Relieved by:  Nothing Worsened by:  Nothing Associated symptoms: fatigue and nausea   Associated symptoms: no chest pain, no chills, no constipation, no diarrhea, no fever and no vomiting   Risk factors comment:  Currently being treated for rectal cancer with radiation   Past Medical History Past Medical History:  Diagnosis Date  . Allergic rhinitis   . Biliary colic   . Gall bladder disease \  . Hepatitis C   . Herpes   . HIV infection ([place])   . Hyperparathyroidism ([place])   . Hypertension   . Insomnia   . Osteoporosis   . Pneumonia    2010   Patient Active Problem List   Diagnosis Date Noted  . Anal cancer ([place]) 07/26/2019  . Rectal mass 07/14/2019  . Constipation 07/14/2019  . Anal fissure 07/14/2019  . Healthcare maintenance 06/30/2018  . Left knee pain 12/30/2017  . Left shoulder pain 12/30/2017  . Closed fracture of right tibial plateau 02/18/2017  . Benign hypertension 02/15/2017  . Screening examination for venereal disease 03/06/2015  . Encounter for long-term (current) use of medications 03/06/2015  . Elevated LFTs 12/11/2014  . Abnormal ultrasound 12/11/2014  . Abdominal pain, epigastric 12/11/2014  . ASCUS of cervix with negative high risk HPV 10/31/2013  . Gall bladder disease 02/03/2013  . 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  . Compression fracture of L3 lumbar vertebra  07/01/2011  . Thyroid dysfunction 07/01/2011  . Anemia 07/01/2011  . Sore throat 04/02/2011  . Hyperparathyroidism 02/10/2011  . Osteopenia 02/03/2011  . PAP SMEAR, LGSIL, ABNORMAL 12/21/2009  . COUGH 07/18/2009  . Chronic hepatitis C virus infection ([place]) 12/19/2008  . LEUKORRHEA 04/07/2008  . FATIGUE 04/07/2008  . BACK PAIN, LUMBAR 11/10/2007  . ALLERGIC RHINITIS 07/09/2007  . PNEUMOCYSTIS PNEUMONIA 05/07/2007  . HYPERTENSION 05/07/2007  . DENTAL CARIES 05/07/2007  . INSOMNIA 05/07/2007  . PNEUMONIA, HX OF 05/07/2007  . HERPES ZOSTER, HX OF 05/07/2007  . Human immunodeficiency virus (HIV) disease ([place]) 08/04/2006   Home Medication(s) Prior to Admission medications   Medication Sig Start Date End Date Taking? Authorizing Provider  acetaminophen (TYLENOL) 500 MG tablet Take 1,000 mg by mouth every 6 (six) hours as needed for moderate pain or headache.   Yes [provider]  AMBULATORY NON FORMULARY MEDICATION Nitroglycerine ointment 0.125 %  Apply a pea sized amount internally four times daily. 08/01/19  Yes Levin Erp, PA  ibuprofen (ADVIL) 800 MG tablet TAKE 1 TABLET BY MOUTH EVERY 8 HOURS AS NEEDED Patient taking differently: Take 800 mg by mouth every 8 (eight) hours as needed for moderate pain.  02/10/19  Yes Aundra Dubin, PA-C  ondansetron (ZOFRAN) 8 MG tablet Take 1 tablet (8 mg total) by mouth 2 (two) times daily as needed (Nausea or vomiting). 07/26/19  Yes Truitt Merle, MD  potassium chloride SA (KLOR-CON) 20 MEQ tablet Take 1 tablet (20 mEq total) by mouth 2 (  two) times daily. Patient taking differently: Take 20 mEq by mouth daily.  09/19/19  Yes Alla Feeling, NP  prochlorperazine (COMPAZINE) 10 MG tablet Take 1 tablet (10 mg total) by mouth every 6 (six) hours as needed (Nausea or vomiting). 07/26/19  Yes Truitt Merle, MD  [place] 600-50-300 MG tablet TAKE 1 TABLET BY MOUTH DAILY Patient taking differently: Take 1 tablet by mouth daily.  03/31/19  Yes  Carlyle Basques, MD  zolpidem (AMBIEN) 10 MG tablet Take 1 tablet (10 mg total) by mouth at bedtime as needed. Patient taking differently: Take 10 mg by mouth at bedtime as needed for sleep.  07/01/17  Yes Carlyle Basques, MD  amLODipine (NORVASC) 10 MG tablet Take 1 tablet (10 mg total) by mouth daily. Patient not taking: Reported on 11/01/2019 08/23/19   Carlyle Basques, MD  cephALEXin (KEFLEX) 500 MG capsule Take 1 capsule (500 mg total) by mouth 2 (two) times daily for 5 days. 11/02/19 11/07/19  Fatima Blank, MD  diphenoxylate-atropine (LOMOTIL) 2.5-0.025 MG tablet Take 1-2 tablets by mouth 4 (four) times daily as needed for diarrhea or loose stools. Patient not taking: Reported on 11/02/2019 09/19/19   Alla Feeling, NP  fluconazole (DIFLUCAN) 150 MG tablet Take 1 tablet (150 mg total) by mouth daily. Patient not taking: Reported on 11/02/2019 08/25/19   Hayden Jeanetta Alonzo, PA-C  nitrofurantoin, macrocrystal-monohydrate, (MACROBID) 100 MG capsule Take 1 capsule (100 mg total) by mouth 2 (two) times daily. Patient not taking: Reported on 11/02/2019 10/27/19   Alla Feeling, NP  ondansetron (ZOFRAN ODT) 4 MG disintegrating tablet Take 1 tablet (4 mg total) by mouth every 8 (eight) hours as needed for up to 3 days for nausea or vomiting. 11/02/19 11/05/19  Randall Rampersad, Grayce Sessions, MD  oxyCODONE (OXY IR/ROXICODONE) 5 MG immediate release tablet Take 1-2 tablets (5-10 mg total) by mouth every 6 (six) hours as needed for severe pain. Patient not taking: Reported on 11/02/2019 10/14/19   Truitt Merle, MD                                                                                                                                    Past Surgical History Past Surgical History:  Procedure Laterality Date  . CHOLECYSTECTOMY N/A 01/09/2015   Procedure: LAPAROSCOPIC CHOLECYSTECTOMY;  Surgeon: [name], MD;  Location: [facility];  Service: General;  Laterality: N/A;  . ECTOPIC PREGNANCY SURGERY    .  ESOPHAGOGASTRODUODENOSCOPY (EGD) WITH PROPOFOL N/A 09/08/2019   Procedure: ESOPHAGOGASTRODUODENOSCOPY (EGD) WITH PROPOFOL;  Surgeon: Milus Banister, MD;  Location: [facility];  Service: Endoscopy;  Laterality: N/A;  . IR FLUORO GUIDE CV LINE LEFT  08/15/2019  . ORIF TIBIA PLATEAU Right 02/18/2017   Procedure: OPEN REDUCTION INTERNAL FIXATION (ORIF) RIGHT TIBIAL PLATEAU;  Surgeon: Leandrew Koyanagi, MD;  Location: [facility];  Service: Orthopedics;  Laterality: Right;  . SHOULDER SURGERY Right 12/2014  . UPPER  ESOPHAGEAL ENDOSCOPIC ULTRASOUND (EUS) N/A 09/08/2019   Procedure: UPPER ESOPHAGEAL ENDOSCOPIC ULTRASOUND (EUS);  Surgeon: Milus Banister, MD;  Location: [facility];  Service: Endoscopy;  Laterality: N/A;   Family History Family History  Problem Relation Age of Onset  . Diabetes Mother   . Hypertension Mother   . Bone cancer Half-Brother   . Prostate cancer Half-Brother   . Colon cancer Neg Hx   . Colon polyps Neg Hx   . Esophageal cancer Neg Hx   . Pancreatic cancer Neg Hx   . Stomach cancer Neg Hx   . Liver disease Neg Hx     Social History Social History   Tobacco Use  . Smoking status: Never Smoker  . Smokeless tobacco: Never Used  Vaping Use  . Vaping Use: Never used  Substance Use Topics  . Alcohol use: Not Currently    Comment: occasionally   . Drug use: No   Allergies Dapsone, Retrovir [zidovudine], and Sulfamethoxazole-trimethoprim  Review of Systems Review of Systems  Constitutional: Positive for fatigue. Negative for chills and fever.  Cardiovascular: Negative for chest pain.  Gastrointestinal: Positive for abdominal pain and nausea. Negative for constipation, diarrhea and vomiting.   All other systems are reviewed and are negative for acute change except as noted in the HPI  Physical Exam Vital Signs  I have reviewed the triage vital signs BP (!) 129/94 (BP Location: Left Arm)   Pulse 80   Temp 98.2 F (36.8 C) (Oral)   Resp (!) 22   Ht 5' (1.524 m)    Wt 70.3 kg   SpO2 96%   BMI 30.27 kg/m   Physical Exam Vitals reviewed. Exam conducted with a chaperone present.  Constitutional:      General: She is not in acute distress.    Appearance: She is well-developed. She is not diaphoretic.  HENT:     Head: Normocephalic and atraumatic.     Nose: Nose normal.  Eyes:     General: No scleral icterus.       Right eye: No discharge.        Left eye: No discharge.     Conjunctiva/sclera: Conjunctivae normal.     Pupils: Pupils are equal, round, and reactive to light.  Cardiovascular:     Rate and Rhythm: Normal rate and regular rhythm.     Heart sounds: No murmur heard.  No friction rub. No gallop.   Pulmonary:     Effort: Pulmonary effort is normal. No respiratory distress.     Breath sounds: Normal breath sounds. No stridor. No rales.  Abdominal:     General: There is no distension.     Palpations: Abdomen is soft.     Tenderness: There is abdominal tenderness in the right upper quadrant and epigastric area. There is no guarding or rebound. Negative signs include Murphy's sign.  Genitourinary:   Musculoskeletal:        General: No tenderness.     Cervical back: Normal range of motion and neck supple.  Skin:    General: Skin is warm and dry.     Findings: No erythema or rash.  Neurological:     Mental Status: She is alert and oriented to person, place, and time.     ED Results and Treatments Labs (all labs ordered are listed, but only abnormal results are displayed) Labs Reviewed  CBC WITH DIFFERENTIAL/PLATELET - Abnormal; Notable for the following components:      Result Value   WBC 3.8 (*)  RBC 2.66 (*)    Hemoglobin 8.8 (*)    HCT 27.3 (*)    MCV 102.6 (*)    RDW 20.1 (*)    Platelets 121 (*)    Neutro Abs 1.5 (*)    All other components within normal limits  COMPREHENSIVE METABOLIC PANEL - Abnormal; Notable for the following components:   Total Protein 8.4 (*)    All other components within normal limits    URINALYSIS, ROUTINE W REFLEX MICROSCOPIC - Abnormal; Notable for the following components:   Protein, ur 30 (*)    Leukocytes,Ua SMALL (*)    Bacteria, UA RARE (*)    All other components within normal limits  I-STAT CHEM 8, ED - Abnormal; Notable for the following components:   Hemoglobin 9.2 (*)    HCT 27.0 (*)    All other components within normal limits  LIPASE, BLOOD  LACTIC ACID, PLASMA  I-STAT BETA HCG BLOOD, ED ([facility], [facility], [facility] ONLY)                                                                                                                         EKG  EKG Interpretation  Date/Time:    Ventricular Rate:    PR Interval:    QRS Duration:   QT Interval:    QTC Calculation:   R Axis:     Text Interpretation:        Radiology CT Chest W Contrast  Result Date: 11/02/2019 CLINICAL DATA:  Right upper quadrant pain. History of anal cancer status post chemotherapy and radiation EXAM: CT CHEST, ABDOMEN, AND PELVIS WITH CONTRAST TECHNIQUE: Multidetector CT imaging of the chest, abdomen and pelvis was performed following the standard protocol during bolus administration of intravenous contrast. CONTRAST:  139mL OMNIPAQUE IOHEXOL 300 MG/ML  SOLN COMPARISON:  Abdominal ultrasound 06/02/2017 FINDINGS: CT CHEST FINDINGS Cardiovascular: Tortuous aorta with scattered atherosclerosis. Heart is normal size. Mediastinum/Nodes: No mediastinal, hilar, or axillary adenopathy. Trachea and esophagus are unremarkable. Thyroid unremarkable. Lungs/Pleura: Biapical scarring. Scarring in the lung bases with bronchiectasis and paraseptal emphysema. No effusions. Musculoskeletal: Chest wall soft tissues are unremarkable. Mild compression fracture through the superior endplate of Z12, chronic. CT ABDOMEN PELVIS FINDINGS Hepatobiliary: No focal liver abnormality is seen. Status post cholecystectomy. No biliary dilatation. Pancreas: No focal abnormality or ductal dilatation. Spleen: No focal abnormality.  Normal  size. Adrenals/Urinary Tract: Small cyst in the midpole of the right kidney. No hydronephrosis. Adrenal glands and urinary bladder unremarkable. Stomach/Bowel: Normal appendix. Stomach, large and small bowel grossly unremarkable. Vascular/Lymphatic: Aortic atherosclerosis. No enlarged abdominal or pelvic lymph nodes. Reproductive: Calcified fibroids in the uterus.  No adnexal mass. Other: No free fluid or free air. Small umbilical hernia containing fat. Musculoskeletal: No acute bony abnormality. Chronic compression fracture at L3 and L2. IMPRESSION: No acute findings in the chest, abdomen or pelvis. Biapical and bibasilar scarring. Bronchiectasis and paraseptal emphysema in the lung bases. Tortuous aorta. Small umbilical hernia containing fat. Electronically Signed  By: Rolm Baptise M.D.   On: 11/02/2019 02:10   CT ABDOMEN PELVIS W CONTRAST  Result Date: 11/02/2019 CLINICAL DATA:  Right upper quadrant pain. History of anal cancer status post chemotherapy and radiation EXAM: CT CHEST, ABDOMEN, AND PELVIS WITH CONTRAST TECHNIQUE: Multidetector CT imaging of the chest, abdomen and pelvis was performed following the standard protocol during bolus administration of intravenous contrast. CONTRAST:  136mL OMNIPAQUE IOHEXOL 300 MG/ML  SOLN COMPARISON:  Abdominal ultrasound 06/02/2017 FINDINGS: CT CHEST FINDINGS Cardiovascular: Tortuous aorta with scattered atherosclerosis. Heart is normal size. Mediastinum/Nodes: No mediastinal, hilar, or axillary adenopathy. Trachea and esophagus are unremarkable. Thyroid unremarkable. Lungs/Pleura: Biapical scarring. Scarring in the lung bases with bronchiectasis and paraseptal emphysema. No effusions. Musculoskeletal: Chest wall soft tissues are unremarkable. Mild compression fracture through the superior endplate of S97, chronic. CT ABDOMEN PELVIS FINDINGS Hepatobiliary: No focal liver abnormality is seen. Status post cholecystectomy. No biliary dilatation. Pancreas: No focal  abnormality or ductal dilatation. Spleen: No focal abnormality.  Normal size. Adrenals/Urinary Tract: Small cyst in the midpole of the right kidney. No hydronephrosis. Adrenal glands and urinary bladder unremarkable. Stomach/Bowel: Normal appendix. Stomach, large and small bowel grossly unremarkable. Vascular/Lymphatic: Aortic atherosclerosis. No enlarged abdominal or pelvic lymph nodes. Reproductive: Calcified fibroids in the uterus.  No adnexal mass. Other: No free fluid or free air. Small umbilical hernia containing fat. Musculoskeletal: No acute bony abnormality. Chronic compression fracture at L3 and L2. IMPRESSION: No acute findings in the chest, abdomen or pelvis. Biapical and bibasilar scarring. Bronchiectasis and paraseptal emphysema in the lung bases. Tortuous aorta. Small umbilical hernia containing fat. Electronically Signed   By: Rolm Baptise M.D.   On: 11/02/2019 02:10    Pertinent labs & imaging results that were available during my care of the patient were reviewed by me and considered in my medical decision making (see chart for details).  Medications Ordered in ED Medications  sodium chloride 0.9 % bolus 1,000 mL (1,000 mLs Intravenous New Bag/Given 11/02/19 0054)    Followed by  0.9 %  sodium chloride infusion (1,000 mLs Intravenous New Bag/Given 11/02/19 0059)  sodium chloride (PF) 0.9 % injection (has no administration in time range)  cephALEXin (KEFLEX) capsule 500 mg (has no administration in time range)  HYDROmorphone (DILAUDID) injection 1 mg (1 mg Intravenous Given 11/02/19 0048)  ondansetron (ZOFRAN) injection 4 mg (4 mg Intravenous Given 11/02/19 0059)  iohexol (OMNIPAQUE) 300 MG/ML solution 100 mL (100 mLs Intravenous Contrast Given 11/02/19 0131)  alum & mag hydroxide-simeth (MAALOX/MYLANTA) 200-200-20 MG/5ML suspension 30 mL (30 mLs Oral Given 11/02/19 0235)    And  lidocaine (XYLOCAINE) 2 % viscous mouth solution 15 mL (15 mLs Oral Given 11/02/19 0235)  hyoscyamine (LEVSIN  SL) SL tablet 0.25 mg (0.25 mg Sublingual Given 11/02/19 0235)  Procedures Procedures  (including critical care time)  Medical Decision Making / ED Course I have reviewed the nursing notes for this encounter and the patient's prior records (if available in EHR or on provided paperwork).   MAE DENUNZIO was evaluated in Emergency Department on 11/02/2019 for the symptoms described in the history of present illness. She was evaluated in the context of the global COVID-19 pandemic, which necessitated consideration that the patient might be at risk for infection with the SARS-CoV-2 virus that causes COVID-19. Institutional protocols and algorithms that pertain to the evaluation of patients at risk for COVID-19 are in a state of rapid change based on information released by regulatory bodies including the CDC and federal and state organizations. These policies and algorithms were followed during the patient's care in the ED.  Work up reassuring w/o evidence of intra-abdominal inflammatory/infectious process or bowel obstruction.  No evidence of metastatic disease. labs at patient's baseline and grossly reassuring.  Patient still having evidence of urinary tract infection.  Was provided with IV fluids, IV pain medicine and GI cocktail which provided significant relief.  Patient able to tolerate oral intake.  We will treat patient with Keflex for UTI.      Final Clinical Impression(s) / ED Diagnoses Final diagnoses:  Upper abdominal pain  Lower urinary tract infection   The patient appears reasonably screened and/or stabilized for discharge and I doubt any other medical condition or other [facility] requiring further screening, evaluation, or treatment in the ED at this time prior to discharge. Safe for discharge with strict return precautions.  Disposition:  Discharge  Condition: Good  I have discussed the results, Dx and Tx plan with the patient/family who expressed understanding and agree(s) with the plan. Discharge instructions discussed at length. The patient/family was given strict return precautions who verbalized understanding of the instructions. No further questions at time of discharge.    ED Discharge Orders         Ordered    cephALEXin (KEFLEX) 500 MG capsule  2 times daily     Discontinue  Reprint     11/02/19 0328    ondansetron (ZOFRAN ODT) 4 MG disintegrating tablet  Every 8 hours PRN     Discontinue  Reprint     11/02/19 0328            Follow Up: Seward Carol, MD [address] [place] [address] [place] [address] [phone]  Schedule an appointment as soon as possible for a visit  in 3-5 days, If symptoms do not improve or  worsen      This chart was dictated using voice recognition software.  Despite best efforts to proofread,  errors can occur which can change the documentation meaning.   Fatima Blank, MD 11/02/19 [phone]

## 2019-11-02 NOTE — Discharge Instructions (Addendum)

## 2019-11-03 ENCOUNTER — Telehealth: Payer: Self-pay

## 2019-11-03 NOTE — Telephone Encounter (Signed)
Patient called requesting a refill on her oxycodone medication due to her having to take 2 pills instead of one because her pain is so bad. Made Cira Rue NP aware of patients request.

## 2019-11-03 NOTE — Telephone Encounter (Signed)
Vanessa Beard called requesting clarification for pain medication use.  Since going to the ED, ED MD determined abd pain and cramping is from ibuprofen.  She wanted to know if she can take the oxycodone.  I told her she can take the oxycodone as long as she doesn't have abdominal discomfort.  She verbalized understanding.

## 2019-11-04 ENCOUNTER — Other Ambulatory Visit: Payer: Self-pay | Admitting: Hematology

## 2019-11-04 ENCOUNTER — Telehealth: Payer: Self-pay | Admitting: Emergency Medicine

## 2019-11-04 MED ORDER — OXYCODONE HCL 5 MG PO TABS: 5.0000 mg | ORAL_TABLET | Freq: Four times a day (QID) | ORAL | 0 refills | Status: DC | PRN

## 2019-11-04 NOTE — Telephone Encounter (Signed)
Patient requesting refill for Oxycodone 5MG . MD made aware of request.  Hughie Closs, RN

## 2019-11-08 ENCOUNTER — Encounter: Payer: Self-pay | Admitting: Internal Medicine

## 2019-11-08 ENCOUNTER — Other Ambulatory Visit (HOSPITAL_COMMUNITY): Payer: Medicare HMO

## 2019-11-08 ENCOUNTER — Other Ambulatory Visit: Payer: Self-pay

## 2019-11-08 ENCOUNTER — Ambulatory Visit: Payer: Medicare HMO | Admitting: Internal Medicine

## 2019-11-08 VITALS — BP 133/87 | HR 82 | Temp 98.6°F | Wt 150.0 lb

## 2019-11-08 DIAGNOSIS — R69 Illness, unspecified: Secondary | ICD-10-CM | POA: Diagnosis not present

## 2019-11-08 DIAGNOSIS — B20 Human immunodeficiency virus [HIV] disease: Secondary | ICD-10-CM

## 2019-11-08 MED ORDER — VALACYCLOVIR HCL 1 G PO TABS: 1000.0000 mg | ORAL_TABLET | Freq: Three times a day (TID) | ORAL | 1 refills | Status: DC

## 2019-11-08 NOTE — Progress Notes (Signed)
RFV: follow up for hiv disease  Patient ID: Vanessa Beard, female   DOB: [birth year], 69 y.o.   MRN: [id number]  HPI 69yo F with hiv disease, CD 4 count of 286/VL>20 in march 2021 on tirumeq. Has received treatment this year for stage IV anal cancer - just finished treatment roughly 3 weeks ago. Still recovering from rectal pain, and still feeling fatigue from medication. Afebrile Looking forward to focusing on her health Needs single level home.  Outpatient Encounter Medications as of 11/08/2019  Medication Sig  . acetaminophen (TYLENOL) 500 MG tablet Take 1,000 mg by mouth every 6 (six) hours as needed for moderate pain or headache.  . ondansetron (ZOFRAN) 8 MG tablet Take 1 tablet (8 mg total) by mouth 2 (two) times daily as needed (Nausea or vomiting).  Marland Kitchen oxyCODONE (OXY IR/ROXICODONE) 5 MG immediate release tablet Take 1 tablet (5 mg total) by mouth every 6 (six) hours as needed for severe pain.  . TRIUMEQ 600-50-300 MG tablet TAKE 1 TABLET BY MOUTH DAILY (Patient taking differently: Take 1 tablet by mouth daily. )  . zolpidem (AMBIEN) 10 MG tablet Take 1 tablet (10 mg total) by mouth at bedtime as needed. (Patient taking differently: Take 10 mg by mouth at bedtime as needed for sleep. )  . AMBULATORY NON FORMULARY MEDICATION Nitroglycerine ointment 0.125 %  Apply a pea sized amount internally four times daily. (Patient not taking: Reported on 11/08/2019)  . amLODipine (NORVASC) 10 MG tablet Take 1 tablet (10 mg total) by mouth daily. (Patient not taking: Reported on 11/08/2019)  . diphenoxylate-atropine (LOMOTIL) 2.5-0.025 MG tablet Take 1-2 tablets by mouth 4 (four) times daily as needed for diarrhea or loose stools. (Patient not taking: Reported on 11/02/2019)  . fluconazole (DIFLUCAN) 150 MG tablet Take 1 tablet (150 mg total) by mouth daily. (Patient not taking: Reported on 11/02/2019)  . ibuprofen (ADVIL) 800 MG tablet TAKE 1 TABLET BY MOUTH EVERY 8 HOURS AS NEEDED (Patient not  taking: Reported on 11/08/2019)  . nitrofurantoin, macrocrystal-monohydrate, (MACROBID) 100 MG capsule Take 1 capsule (100 mg total) by mouth 2 (two) times daily. (Patient not taking: Reported on 11/02/2019)  . potassium chloride SA (KLOR-CON) 20 MEQ tablet Take 1 tablet (20 mEq total) by mouth 2 (two) times daily. (Patient not taking: Reported on 11/08/2019)  . prochlorperazine (COMPAZINE) 10 MG tablet Take 1 tablet (10 mg total) by mouth every 6 (six) hours as needed (Nausea or vomiting). (Patient not taking: Reported on 11/08/2019)   No facility-administered encounter medications on file as of 11/08/2019.     Patient Active Problem List   Diagnosis Date Noted  . Anal cancer ([place]) 07/26/2019  . Rectal mass 07/14/2019  . Constipation 07/14/2019  . Anal fissure 07/14/2019  . Healthcare maintenance 06/30/2018  . Left knee pain 12/30/2017  . Left shoulder pain 12/30/2017  . Closed fracture of right tibial plateau 02/18/2017  . Benign hypertension 02/15/2017  . Screening examination for venereal disease 03/06/2015  . Encounter for long-term (current) use of medications 03/06/2015  . Elevated LFTs 12/11/2014  . Abnormal ultrasound 12/11/2014  . Abdominal pain, epigastric 12/11/2014  . ASCUS of cervix with negative high risk HPV 10/31/2013  . Gall bladder disease 02/03/2013  . 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  . Compression fracture of L3 lumbar vertebra 07/01/2011  . Thyroid dysfunction 07/01/2011  . Anemia 07/01/2011  . Sore throat 04/02/2011  . Hyperparathyroidism 02/10/2011  . Osteopenia 02/03/2011  . PAP SMEAR, LGSIL, ABNORMAL 12/21/2009  .  COUGH 07/18/2009  . Chronic hepatitis C virus infection ([place]) 12/19/2008  . LEUKORRHEA 04/07/2008  . FATIGUE 04/07/2008  . BACK PAIN, LUMBAR 11/10/2007  . ALLERGIC RHINITIS 07/09/2007  . PNEUMOCYSTIS PNEUMONIA 05/07/2007  . HYPERTENSION 05/07/2007  . DENTAL CARIES 05/07/2007  . INSOMNIA 05/07/2007  . PNEUMONIA,  HX OF 05/07/2007  . HERPES ZOSTER, HX OF 05/07/2007  . Human immunodeficiency virus (HIV) disease ([place]) 08/04/2006     Health Maintenance Due  Topic Date Due  . COVID-19 Vaccine (1) Never done  . PNA vac Low Risk Adult (2 of 2 - PPSV23) 11/17/2018     Review of Systems  Physical Exam   BP 133/87   Pulse 82   Temp 98.6 F (37 C)   Wt 150 lb (68 kg)   SpO2 95%   BMI 29.29 kg/m    Lab Results  Component Value Date   CD4TCELL 19 (L) 08/08/2019   Lab Results  Component Value Date   CD4TABS 237 (L) 08/08/2019   CD4TABS 213 (L) 10/04/2018   CD4TABS 300 (L) 06/30/2018   Lab Results  Component Value Date   HIV1RNAQUANT <20 DETECTED (A) 08/08/2019   Lab Results  Component Value Date   HEPBSAB NEG 12/29/2013   Lab Results  Component Value Date   LABRPR NON-REACTIVE 10/04/2018    CBC Lab Results  Component Value Date   WBC 3.8 (L) 11/02/2019   RBC 2.66 (L) 11/02/2019   HGB 9.2 (L) 11/02/2019   HCT 27.0 (L) 11/02/2019   PLT 121 (L) 11/02/2019   MCV 102.6 (H) 11/02/2019   MCH 33.1 11/02/2019   MCHC 32.2 11/02/2019   RDW 20.1 (H) 11/02/2019   LYMPHSABS 1.7 11/02/2019   MONOABS 0.4 11/02/2019   EOSABS 0.1 11/02/2019    BMET Lab Results  Component Value Date   NA 141 11/02/2019   K 3.6 11/02/2019   CL 106 11/02/2019   CO2 22 11/02/2019   GLUCOSE 89 11/02/2019   BUN 11 11/02/2019   CREATININE 0.80 11/02/2019   CALCIUM 9.1 11/02/2019   GFRNONAA >60 11/02/2019   GFRAA >60 11/02/2019      Assessment and Plan  hiv disease = will check labs. Continue with triumeq  Anal cancer = follow up with oncology.  Using [facility] - for radiation, ulcers vs herpes. Will do a trial of antivirals to see if that helps

## 2019-11-09 LAB — T-HELPER CELL (CD4) - (RCID CLINIC ONLY)
CD4 % Helper T Cell: 8 % — ABNORMAL LOW (ref 33–65)
CD4 T Cell Abs: 84 /uL — ABNORMAL LOW (ref 400–1790)

## 2019-11-11 LAB — HIV-1 RNA QUANT-NO REFLEX-BLD
HIV 1 RNA Quant: 26 copies/mL — ABNORMAL HIGH
HIV-1 RNA Quant, Log: 1.41 Log copies/mL — ABNORMAL HIGH

## 2019-11-14 ENCOUNTER — Inpatient Hospital Stay (HOSPITAL_BASED_OUTPATIENT_CLINIC_OR_DEPARTMENT_OTHER): Payer: Medicare HMO | Admitting: Hematology

## 2019-11-14 ENCOUNTER — Other Ambulatory Visit: Payer: Self-pay

## 2019-11-14 ENCOUNTER — Inpatient Hospital Stay: Payer: Medicare HMO

## 2019-11-14 VITALS — BP 124/90 | HR 100 | Temp 97.7°F | Resp 20 | Ht 60.0 in | Wt 152.1 lb

## 2019-11-14 DIAGNOSIS — D61810 Antineoplastic chemotherapy induced pancytopenia: Secondary | ICD-10-CM | POA: Diagnosis not present

## 2019-11-14 DIAGNOSIS — C210 Malignant neoplasm of anus, unspecified: Secondary | ICD-10-CM | POA: Diagnosis not present

## 2019-11-14 DIAGNOSIS — I10 Essential (primary) hypertension: Secondary | ICD-10-CM | POA: Diagnosis not present

## 2019-11-14 DIAGNOSIS — M810 Age-related osteoporosis without current pathological fracture: Secondary | ICD-10-CM | POA: Diagnosis not present

## 2019-11-14 DIAGNOSIS — R300 Dysuria: Secondary | ICD-10-CM | POA: Diagnosis not present

## 2019-11-14 DIAGNOSIS — K5730 Diverticulosis of large intestine without perforation or abscess without bleeding: Secondary | ICD-10-CM | POA: Diagnosis not present

## 2019-11-14 DIAGNOSIS — R69 Illness, unspecified: Secondary | ICD-10-CM | POA: Diagnosis not present

## 2019-11-14 DIAGNOSIS — T451X5A Adverse effect of antineoplastic and immunosuppressive drugs, initial encounter: Secondary | ICD-10-CM | POA: Diagnosis not present

## 2019-11-14 DIAGNOSIS — E213 Hyperparathyroidism, unspecified: Secondary | ICD-10-CM | POA: Diagnosis not present

## 2019-11-14 DIAGNOSIS — J984 Other disorders of lung: Secondary | ICD-10-CM | POA: Diagnosis not present

## 2019-11-14 LAB — CBC WITH DIFFERENTIAL (CANCER CENTER ONLY)
Abs Immature Granulocytes: 0.02 10*3/uL (ref 0.00–0.07)
Basophils Absolute: 0 10*3/uL (ref 0.0–0.1)
Basophils Relative: 0 %
Eosinophils Absolute: 0.7 10*3/uL — ABNORMAL HIGH (ref 0.0–0.5)
Eosinophils Relative: 16 %
HCT: 28.8 % — ABNORMAL LOW (ref 36.0–46.0)
Hemoglobin: 9.4 g/dL — ABNORMAL LOW (ref 12.0–15.0)
Immature Granulocytes: 0 %
Lymphocytes Relative: 19 %
Lymphs Abs: 0.9 10*3/uL (ref 0.7–4.0)
MCH: 34.1 pg — ABNORMAL HIGH (ref 26.0–34.0)
MCHC: 32.6 g/dL (ref 30.0–36.0)
MCV: 104.3 fL — ABNORMAL HIGH (ref 80.0–100.0)
Monocytes Absolute: 0.3 10*3/uL (ref 0.1–1.0)
Monocytes Relative: 7 %
Neutro Abs: 2.6 10*3/uL (ref 1.7–7.7)
Neutrophils Relative %: 58 %
Platelet Count: 154 10*3/uL (ref 150–400)
RBC: 2.76 MIL/uL — ABNORMAL LOW (ref 3.87–5.11)
RDW: 19.2 % — ABNORMAL HIGH (ref 11.5–15.5)
WBC Count: 4.6 10*3/uL (ref 4.0–10.5)
nRBC: 0 % (ref 0.0–0.2)

## 2019-11-14 LAB — CMP (CANCER CENTER ONLY)
ALT: 12 U/L (ref 0–44)
AST: 25 U/L (ref 15–41)
Albumin: 3.1 g/dL — ABNORMAL LOW (ref 3.5–5.0)
Alkaline Phosphatase: 86 U/L (ref 38–126)
Anion gap: 13 (ref 5–15)
BUN: 14 mg/dL (ref 8–23)
CO2: 21 mmol/L — ABNORMAL LOW (ref 22–32)
Calcium: 9.3 mg/dL (ref 8.9–10.3)
Chloride: 108 mmol/L (ref 98–111)
Creatinine: 0.92 mg/dL (ref 0.44–1.00)
GFR, Est AFR Am: >60 mL/min (ref >60)
GFR, Estimated: >60 mL/min (ref >60)
Glucose, Bld: 97 mg/dL (ref 70–99)
Potassium: 3.1 mmol/L — ABNORMAL LOW (ref 3.5–5.1)
Sodium: 142 mmol/L (ref 135–145)
Total Bilirubin: 0.5 mg/dL (ref 0.3–1.2)
Total Protein: 8.2 g/dL — ABNORMAL HIGH (ref 6.5–8.1)

## 2019-11-14 LAB — FERRITIN: Ferritin: 342 ng/mL — ABNORMAL HIGH (ref 11–307)

## 2019-11-14 LAB — IRON AND TIBC
Iron: 78 ug/dL (ref 41–142)
Saturation Ratios: 36 % (ref 21–57)
TIBC: 215 ug/dL — ABNORMAL LOW (ref 236–444)
UIBC: 137 ug/dL (ref 120–384)

## 2019-11-14 MED ORDER — GABAPENTIN 300 MG PO CAPS
300.0000 mg | ORAL_CAPSULE | Freq: Two times a day (BID) | ORAL | 1 refills | Status: DC
Start: 2019-11-14 — End: 2019-12-24

## 2019-11-15 ENCOUNTER — Encounter: Payer: Self-pay | Admitting: Hematology

## 2019-11-15 ENCOUNTER — Telehealth: Payer: Self-pay | Admitting: Hematology

## 2019-11-15 NOTE — Telephone Encounter (Signed)
Scheduled per 6/28 los. Pt is aware of appt time and date. Requested afternoon appt.

## 2019-11-16 ENCOUNTER — Other Ambulatory Visit: Payer: Self-pay | Admitting: Internal Medicine

## 2019-11-16 DIAGNOSIS — Z1231 Encounter for screening mammogram for malignant neoplasm of breast: Secondary | ICD-10-CM

## 2019-11-21 ENCOUNTER — Other Ambulatory Visit: Payer: Self-pay | Admitting: Oncology

## 2019-11-21 MED ORDER — OXYCODONE HCL 5 MG PO TABS: 5.0000 mg | ORAL_TABLET | Freq: Four times a day (QID) | ORAL | 0 refills | Status: DC | PRN

## 2019-11-22 ENCOUNTER — Telehealth: Payer: Self-pay

## 2019-11-22 ENCOUNTER — Other Ambulatory Visit: Payer: Self-pay | Admitting: Oncology

## 2019-11-22 NOTE — Telephone Encounter (Signed)
Ms Vanessa Beard left vm requesting oxycodone refill

## 2019-11-22 NOTE — Telephone Encounter (Signed)
Dr. Benay Spice, Sending to you for approval or denial.  Gardiner Rhyme, RN

## 2019-11-23 NOTE — Telephone Encounter (Signed)
Refill request

## 2019-11-24 ENCOUNTER — Other Ambulatory Visit: Payer: Self-pay | Admitting: Hematology

## 2019-11-24 ENCOUNTER — Other Ambulatory Visit: Payer: Self-pay | Admitting: Oncology

## 2019-11-24 MED ORDER — OXYCODONE HCL 5 MG PO TABS: 5.0000 mg | ORAL_TABLET | Freq: Four times a day (QID) | ORAL | 0 refills | Status: DC | PRN

## 2019-11-24 NOTE — Telephone Encounter (Signed)
Printed copy given to dr Burr Medico on 11/24/2019

## 2019-11-25 ENCOUNTER — Inpatient Hospital Stay: Payer: Medicare HMO | Admitting: Hematology

## 2019-11-25 ENCOUNTER — Inpatient Hospital Stay: Payer: Medicare HMO

## 2019-11-25 NOTE — Telephone Encounter (Signed)
Disregard

## 2019-11-30 ENCOUNTER — Telehealth: Payer: Self-pay | Admitting: Radiation Oncology

## 2019-11-30 NOTE — Telephone Encounter (Signed)
  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  Interval Since Last Radiation:  8 weeks   08/15/19-10/05/19:  The anus, regional nodes of the pelvis, and gastrohepatic node were treated to 54 Gy in 30 fractions.  Narrative:  The patient was contacted today for routine follow-up. During treatment she did very well with radiotherapy. She did however have anticipated discomfort during the course and anticipated desquamation.   Impression/Plan: 1. 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 Unfortunately we could not reach the patient today by phone. I will be happy to continue to follow her as needed, but she will also continue to follow up with Dr. Burr Medico in medical oncology.      Carola Rhine, PAC

## 2019-12-06 ENCOUNTER — Telehealth: Payer: Self-pay | Admitting: *Deleted

## 2019-12-06 ENCOUNTER — Inpatient Hospital Stay: Payer: Medicare HMO | Attending: Hematology | Admitting: Medical

## 2019-12-06 ENCOUNTER — Telehealth: Payer: Self-pay

## 2019-12-06 ENCOUNTER — Inpatient Hospital Stay: Payer: Medicare HMO

## 2019-12-06 ENCOUNTER — Other Ambulatory Visit: Payer: Self-pay

## 2019-12-06 VITALS — BP 140/89 | HR 85 | Temp 97.6°F | Resp 18 | Ht 60.0 in | Wt 155.7 lb

## 2019-12-06 DIAGNOSIS — Z882 Allergy status to sulfonamides status: Secondary | ICD-10-CM | POA: Diagnosis not present

## 2019-12-06 DIAGNOSIS — Z8042 Family history of malignant neoplasm of prostate: Secondary | ICD-10-CM | POA: Insufficient documentation

## 2019-12-06 DIAGNOSIS — K645 Perianal venous thrombosis: Secondary | ICD-10-CM | POA: Insufficient documentation

## 2019-12-06 DIAGNOSIS — C210 Malignant neoplasm of anus, unspecified: Secondary | ICD-10-CM | POA: Insufficient documentation

## 2019-12-06 DIAGNOSIS — Z79899 Other long term (current) drug therapy: Secondary | ICD-10-CM | POA: Insufficient documentation

## 2019-12-06 DIAGNOSIS — Z9049 Acquired absence of other specified parts of digestive tract: Secondary | ICD-10-CM | POA: Insufficient documentation

## 2019-12-06 DIAGNOSIS — Z833 Family history of diabetes mellitus: Secondary | ICD-10-CM | POA: Diagnosis not present

## 2019-12-06 DIAGNOSIS — C211 Malignant neoplasm of anal canal: Secondary | ICD-10-CM | POA: Diagnosis present

## 2019-12-06 DIAGNOSIS — Z808 Family history of malignant neoplasm of other organs or systems: Secondary | ICD-10-CM | POA: Insufficient documentation

## 2019-12-06 DIAGNOSIS — R197 Diarrhea, unspecified: Secondary | ICD-10-CM | POA: Diagnosis not present

## 2019-12-06 DIAGNOSIS — Z8249 Family history of ischemic heart disease and other diseases of the circulatory system: Secondary | ICD-10-CM | POA: Diagnosis not present

## 2019-12-06 DIAGNOSIS — B20 Human immunodeficiency virus [HIV] disease: Secondary | ICD-10-CM | POA: Diagnosis not present

## 2019-12-06 DIAGNOSIS — R69 Illness, unspecified: Secondary | ICD-10-CM | POA: Diagnosis not present

## 2019-12-06 LAB — CBC WITH DIFFERENTIAL (CANCER CENTER ONLY)
Abs Immature Granulocytes: 0.01 10*3/uL (ref 0.00–0.07)
Basophils Absolute: 0 10*3/uL (ref 0.0–0.1)
Basophils Relative: 0 %
Eosinophils Absolute: 0.4 10*3/uL (ref 0.0–0.5)
Eosinophils Relative: 12 %
HCT: 28.1 % — ABNORMAL LOW (ref 36.0–46.0)
Hemoglobin: 8.8 g/dL — ABNORMAL LOW (ref 12.0–15.0)
Immature Granulocytes: 0 %
Lymphocytes Relative: 19 %
Lymphs Abs: 0.7 10*3/uL (ref 0.7–4.0)
MCH: 34.4 pg — ABNORMAL HIGH (ref 26.0–34.0)
MCHC: 31.3 g/dL (ref 30.0–36.0)
MCV: 109.8 fL — ABNORMAL HIGH (ref 80.0–100.0)
Monocytes Absolute: 0.4 10*3/uL (ref 0.1–1.0)
Monocytes Relative: 10 %
Neutro Abs: 2.1 10*3/uL (ref 1.7–7.7)
Neutrophils Relative %: 59 %
Platelet Count: 121 10*3/uL — ABNORMAL LOW (ref 150–400)
RBC: 2.56 MIL/uL — ABNORMAL LOW (ref 3.87–5.11)
RDW: 15.4 % (ref 11.5–15.5)
WBC Count: 3.6 10*3/uL — ABNORMAL LOW (ref 4.0–10.5)
nRBC: 0 % (ref 0.0–0.2)

## 2019-12-06 MED ORDER — HYDROCORTISONE ACETATE 1 % EX OINT: 1.0000 "application " | TOPICAL_OINTMENT | Freq: Two times a day (BID) | CUTANEOUS | 3 refills | Status: DC

## 2019-12-06 MED ORDER — LIDOCAINE VISCOUS HCL 2 % MT SOLN: OROMUCOSAL | 1 refills | Status: DC

## 2019-12-06 MED ORDER — OXYCODONE HCL 5 MG PO TABS: 5.0000 mg | ORAL_TABLET | ORAL | 0 refills | Status: DC | PRN

## 2019-12-06 MED ORDER — DIPHENOXYLATE-ATROPINE 2.5-0.025 MG PO TABS: 1.0000 | ORAL_TABLET | Freq: Four times a day (QID) | ORAL | 1 refills | Status: DC | PRN

## 2019-12-06 NOTE — Telephone Encounter (Signed)
Called patient for a well check. Per Shona Simpson PA call patient to see how she is feeling after radiation treatment. Could not reach patient or leave a voicemail message. Answering machine states that the mail box is full. TM

## 2019-12-06 NOTE — Telephone Encounter (Signed)
Pt called requesting a refill of Oxycodone. Also requesting antibiotics for UTI symptom, burning with urination.  Labs ordered- will see Sandi Mealy at 2:00, labs at 1:30

## 2019-12-08 ENCOUNTER — Other Ambulatory Visit: Payer: Self-pay | Admitting: Internal Medicine

## 2019-12-08 DIAGNOSIS — B20 Human immunodeficiency virus [HIV] disease: Secondary | ICD-10-CM

## 2019-12-09 NOTE — Progress Notes (Signed)
[facility] Progress Note   Vanessa Beard [id number] 31-Oct-1950 69 y.o.  Vanessa Beard is managed by Dr. Truitt Merle  Actively treated with chemotherapy/immunotherapy/hormonal therapy: no  Current therapy: Active surveillance  Next scheduled appointment with provider: 01/04/2020  Assessment: Plan:    Anal cancer ([place])  Thrombosed hemorrhoids   Anal cancer: The patient continues to be followed by Dr.Feng with active surveillance.  She is scheduled to be seen in follow-up on 01/04/2020.  Thrombosed hemorrhoid: Patient was given a refill of her Roxicodone.  She was given a prescription for Lomotil, Anusol HC, and viscous lidocaine.  She was told to purchase Tucks pads.  She has a sitz bath at home which she will use.  She is seeing her surgeon next week and will have them checked the progress of the resolution of her thrombosed hemorrhoid.  Please see After Visit Summary for patient specific instructions.  Future Appointments  Date Time Provider [place]  12/15/2019  1:50 PM [facility] MM 2 [facility]  01/04/2020  1:00 PM [facility] LAB 4 [facility] None  01/04/2020  1:40 PM Truitt Merle, MD [facility] None  02/08/2020  3:30 PM Carlyle Basques, MD [facility]    No orders of the defined types were placed in this encounter.      Subjective:   Patient ID:  Vanessa Beard is a 69 y.o. (DOB December 11, 1950) female.  Chief Complaint: No chief complaint on file.   HPI Vanessa Beard  .is a 69 y.o. female with a diagnosis of anal cancer.  She is status post treatment with concurrent chemoradiation and is followed with active surveillance by Dr. Truitt Merle.  She presents to the clinic today with report of anal pain.  She has had loose stools since her treatment with radiation and chemotherapy.  She is taking Kaopectate and Imodium.  She did report 1 recent episode of straining with bowel movement.  She has noticed a increase in her pain since that  time.  She denies fevers, chills, or sweats.  She has not noted any blood in her bowel movements.  Medications: I have reviewed the patient's current medications.  Allergies:  Allergies  Allergen Reactions  . Dapsone Nausea Only    stomach  burning  . Retrovir [Zidovudine] Other (See Comments)    Changed skin color  . Sulfamethoxazole-Trimethoprim Other (See Comments)    Flu like symptoms    Past Medical History:  Diagnosis Date  . Allergic rhinitis   . Biliary colic   . Gall bladder disease \  . Hepatitis C   . Herpes   . HIV infection ([place])   . Hyperparathyroidism ([place])   . Hypertension   . Insomnia   . Osteoporosis   . Pneumonia    2010    Past Surgical History:  Procedure Laterality Date  . CHOLECYSTECTOMY N/A 01/09/2015   Procedure: LAPAROSCOPIC CHOLECYSTECTOMY;  Surgeon: [name], MD;  Location: [facility];  Service: General;  Laterality: N/A;  . ECTOPIC PREGNANCY SURGERY    . ESOPHAGOGASTRODUODENOSCOPY (EGD) WITH PROPOFOL N/A 09/08/2019   Procedure: ESOPHAGOGASTRODUODENOSCOPY (EGD) WITH PROPOFOL;  Surgeon: Milus Banister, MD;  Location: [facility];  Service: Endoscopy;  Laterality: N/A;  . IR FLUORO GUIDE CV LINE LEFT  08/15/2019  . ORIF TIBIA PLATEAU Right 02/18/2017   Procedure: OPEN REDUCTION INTERNAL FIXATION (ORIF) RIGHT TIBIAL PLATEAU;  Surgeon: Leandrew Koyanagi, MD;  Location: [facility];  Service: Orthopedics;  Laterality: Right;  . SHOULDER SURGERY  Right 12/2014  . UPPER ESOPHAGEAL ENDOSCOPIC ULTRASOUND (EUS) N/A 09/08/2019   Procedure: UPPER ESOPHAGEAL ENDOSCOPIC ULTRASOUND (EUS);  Surgeon: Milus Banister, MD;  Location: [facility];  Service: Endoscopy;  Laterality: N/A;    Family History  Problem Relation Age of Onset  . Diabetes Mother   . Hypertension Mother   . Bone cancer Half-Brother   . Prostate cancer Half-Brother   . Colon cancer Neg Hx   . Colon polyps Neg Hx   . Esophageal cancer Neg Hx   . Pancreatic cancer Neg Hx   . Stomach cancer Neg  Hx   . Liver disease Neg Hx     Social History   Socioeconomic History  . Marital status: Divorced    Spouse name: Not on file  . Number of children: 2  . Years of education: Not on file  . Highest education level: Not on file  Occupational History  . Occupation: CNA  Tobacco Use  . Smoking status: Never Smoker  . Smokeless tobacco: Never Used  Vaping Use  . Vaping Use: Never used  Substance and Sexual Activity  . Alcohol use: Not Currently    Comment: occasionally   . Drug use: No  . Sexual activity: Yes    Partners: Male    Birth control/protection: Condom    Comment: declined condoms  Other Topics Concern  . Not on file  Social History Narrative  . Not on file   Social Determinants of Health   Financial Resource Strain:   . Difficulty of Paying Living Expenses:   Food Insecurity:   . Worried About Charity fundraiser in the Last Year:   . Arboriculturist in the Last Year:   Transportation Needs:   . Film/video editor (Medical):   Marland Kitchen Lack of Transportation (Non-Medical):   Physical Activity:   . Days of Exercise per Week:   . Minutes of Exercise per Session:   Stress:   . Feeling of Stress :   Social Connections:   . Frequency of Communication with Friends and Family:   . Frequency of Social Gatherings with Friends and Family:   . Attends Religious Services:   . Active Member of Clubs or Organizations:   . Attends Archivist Meetings:   Marland Kitchen Marital Status:   Intimate Partner Violence:   . Fear of Current or Ex-Partner:   . Emotionally Abused:   Marland Kitchen Physically Abused:   . Sexually Abused:     Past Medical History, Surgical history, Social history, and Family history were reviewed and updated as appropriate.   Please see review of systems for further details on the patient's review from today.   Review of Systems:  Review of Systems  Constitutional: Negative for chills, diaphoresis and fever.  Gastrointestinal: Positive for diarrhea and  rectal pain. Negative for anal bleeding, blood in stool and constipation.    Objective:   Physical Exam:  BP 140/89 (BP Location: Left Arm, Patient Position: Sitting)   Pulse 85   Temp 97.6 F (36.4 C)   Resp 18   Ht 5' (1.524 m)   Wt 155 lb 11.2 oz (70.6 kg)   SpO2 97%   BMI 30.41 kg/m  ECOG: 0  Physical Exam Constitutional:      General: She is not in acute distress.    Appearance: Normal appearance. She is not ill-appearing, toxic-appearing or diaphoretic.  HENT:     Head: Normocephalic and atraumatic.  Cardiovascular:  Rate and Rhythm: Normal rate and regular rhythm.     Heart sounds: No murmur heard.  No friction rub. No gallop.   Pulmonary:     Effort: Pulmonary effort is normal. No respiratory distress.     Breath sounds: Normal breath sounds. No wheezing, rhonchi or rales.  Genitourinary:    Rectum: Normal.     Comments: Patient was noted to have a 1.25 x 1.25 cm brown mass over the anterior anus consistent with a thrombosed hemorrhoid.  Patient was also noted to have 2 superficial shallow ulcerations in the bilateral medial buttocks. Skin:    General: Skin is warm and dry.  Neurological:     Mental Status: She is alert.     Coordination: Coordination normal.     Gait: Gait normal.  Psychiatric:        Mood and Affect: Mood normal.        Behavior: Behavior normal.        Thought Content: Thought content normal.        Judgment: Judgment normal.     Lab Review:     Component Value Date/Time   NA 142 11/14/2019 1156   NA 142 08/31/2013 1515   K 3.1 (L) 11/14/2019 1156   K 3.4 (L) 08/31/2013 1515   CL 108 11/14/2019 1156   CL 104 07/07/2012 1512   CO2 21 (L) 11/14/2019 1156   CO2 27 08/31/2013 1515   GLUCOSE 97 11/14/2019 1156   GLUCOSE 69 (L) 08/31/2013 1515   GLUCOSE 88 07/07/2012 1512   BUN 14 11/14/2019 1156   BUN 14.7 08/31/2013 1515   CREATININE 0.92 11/14/2019 1156   CREATININE 0.87 08/08/2019 1535   CREATININE 0.8 08/31/2013 1515    CALCIUM 9.3 11/14/2019 1156   CALCIUM 9.6 08/31/2013 1515   PROT 8.2 (H) 11/14/2019 1156   PROT 8.8 (H) 08/31/2013 1515   ALBUMIN 3.1 (L) 11/14/2019 1156   ALBUMIN 3.7 08/31/2013 1515   AST 25 11/14/2019 1156   AST 25 08/31/2013 1515   ALT 12 11/14/2019 1156   ALT 9 08/31/2013 1515   ALKPHOS 86 11/14/2019 1156   ALKPHOS 99 08/31/2013 1515   BILITOT 0.5 11/14/2019 1156   BILITOT 0.67 08/31/2013 1515   GFRNONAA >60 11/14/2019 1156   GFRNONAA 67 08/08/2019 1535   GFRAA >60 11/14/2019 1156   GFRAA 78 08/08/2019 1535       Component Value Date/Time   WBC 3.6 (L) 12/06/2019 1345   WBC 3.8 (L) 11/02/2019 0030   RBC 2.56 (L) 12/06/2019 1345   HGB 8.8 (L) 12/06/2019 1345   HGB 12.5 02/07/2019 1520   HGB 12.7 08/31/2013 1515   HCT 28.1 (L) 12/06/2019 1345   HCT 37.8 02/07/2019 1520   HCT 39.3 08/31/2013 1515   PLT 121 (L) 12/06/2019 1345   PLT 191 02/07/2019 1520   MCV 109.8 (H) 12/06/2019 1345   MCV 90 02/07/2019 1520   MCV 94.0 08/31/2013 1515   MCH 34.4 (H) 12/06/2019 1345   MCHC 31.3 12/06/2019 1345   RDW 15.4 12/06/2019 1345   RDW 14.0 02/07/2019 1520   RDW 12.7 08/31/2013 1515   LYMPHSABS 0.7 12/06/2019 1345   LYMPHSABS 1.5 02/07/2019 1520   LYMPHSABS 1.2 08/31/2013 1515   MONOABS 0.4 12/06/2019 1345   MONOABS 0.3 08/31/2013 1515   EOSABS 0.4 12/06/2019 1345   EOSABS 0.1 02/07/2019 1520   BASOSABS 0.0 12/06/2019 1345   BASOSABS 0.0 02/07/2019 1520   BASOSABS 0.0 08/31/2013 1515   -------------------------------  Imaging from last 24 hours (if applicable):  Radiology interpretation: No results found.

## 2019-12-11 ENCOUNTER — Encounter (HOSPITAL_COMMUNITY): Payer: Self-pay

## 2019-12-11 ENCOUNTER — Emergency Department (HOSPITAL_COMMUNITY)
Admission: EM | Admit: 2019-12-11 | Discharge: 2019-12-12 | Disposition: A | Discharge status: Home / Self Care | Payer: Medicare HMO | Attending: Emergency Medicine | Admitting: Emergency Medicine

## 2019-12-11 DIAGNOSIS — N390 Urinary tract infection, site not specified: Secondary | ICD-10-CM | POA: Diagnosis not present

## 2019-12-11 DIAGNOSIS — Z5321 Procedure and treatment not carried out due to patient leaving prior to being seen by health care provider: Secondary | ICD-10-CM | POA: Insufficient documentation

## 2019-12-11 DIAGNOSIS — K649 Unspecified hemorrhoids: Secondary | ICD-10-CM | POA: Diagnosis not present

## 2019-12-11 DIAGNOSIS — R3915 Urgency of urination: Secondary | ICD-10-CM | POA: Diagnosis present

## 2019-12-11 LAB — URINALYSIS, ROUTINE W REFLEX MICROSCOPIC
Bilirubin Urine: NEGATIVE
Glucose, UA: NEGATIVE mg/dL
Hgb urine dipstick: NEGATIVE
Ketones, ur: NEGATIVE mg/dL
Nitrite: NEGATIVE
Protein, ur: 100 mg/dL — AB
Specific Gravity, Urine: 1.023 (ref 1.005–1.030)
WBC, UA: >50 WBC/hpf — ABNORMAL HIGH (ref 0–5)
pH: 6 (ref 5.0–8.0)

## 2019-12-11 NOTE — ED Triage Notes (Signed)
Pt presents with c/o urinary urgency. Pt is a cancer pt, has finished chemo. Pt reports that she is being treated for a hemorrhoid at this time. Pt reports that she is able to pass urine but that it is difficult and she has to strain, putting pressure on her hemorrhoid.

## 2019-12-12 ENCOUNTER — Encounter (HOSPITAL_COMMUNITY): Payer: Self-pay | Admitting: Emergency Medicine

## 2019-12-12 ENCOUNTER — Emergency Department (HOSPITAL_COMMUNITY)
Admission: EM | Admit: 2019-12-12 | Discharge: 2019-12-13 | Disposition: A | Discharge status: Home / Self Care | Payer: Medicare HMO | Source: Home / Self Care | Attending: Emergency Medicine | Admitting: Emergency Medicine

## 2019-12-12 ENCOUNTER — Other Ambulatory Visit: Payer: Self-pay

## 2019-12-12 DIAGNOSIS — N390 Urinary tract infection, site not specified: Secondary | ICD-10-CM | POA: Diagnosis not present

## 2019-12-12 DIAGNOSIS — K649 Unspecified hemorrhoids: Secondary | ICD-10-CM

## 2019-12-12 LAB — URINE CULTURE

## 2019-12-12 NOTE — ED Triage Notes (Signed)
Ongoing dysuria and straining with urination for a week. Was not tested/treated for urine infection.

## 2019-12-12 NOTE — ED Notes (Signed)
Pt asked registration if she needed to provide a urine specimen. She used the bathroom while in the lobby and is unable to urinate at this time.

## 2019-12-13 LAB — URINALYSIS, ROUTINE W REFLEX MICROSCOPIC
Bilirubin Urine: NEGATIVE
Glucose, UA: NEGATIVE mg/dL
Hgb urine dipstick: NEGATIVE
Ketones, ur: NEGATIVE mg/dL
Nitrite: NEGATIVE
Protein, ur: 30 mg/dL — AB
Specific Gravity, Urine: 1.021 (ref 1.005–1.030)
WBC, UA: >50 WBC/hpf — ABNORMAL HIGH (ref 0–5)
pH: 6 (ref 5.0–8.0)

## 2019-12-13 MED ORDER — OXYCODONE-ACETAMINOPHEN 5-325 MG PO TABS
1.0000 | ORAL_TABLET | Freq: Once | ORAL | Status: AC
  Administered 2019-12-13: 1 via ORAL
  Filled 2019-12-13: qty 1

## 2019-12-13 MED ORDER — CEPHALEXIN 500 MG PO CAPS
500.0000 mg | ORAL_CAPSULE | Freq: Three times a day (TID) | ORAL | 0 refills | Status: DC
Start: 2019-12-13 — End: 2020-01-06

## 2019-12-13 MED ORDER — CEPHALEXIN 500 MG PO CAPS
500.0000 mg | ORAL_CAPSULE | Freq: Once | ORAL | Status: AC
  Administered 2019-12-13: 500 mg via ORAL
  Filled 2019-12-13: qty 1

## 2019-12-13 NOTE — ED Notes (Signed)
Patients bladder scan reading 171ml, patient aware urine sample is needed.

## 2019-12-13 NOTE — Discharge Instructions (Addendum)
Continue the treatment of your hemorrhoids but you can also sprinkle sugar on the hemorrhoids which should help shrink the size and make it less uncomfortable.  Continue the new antibiotic.  The culture results should be ready in 2 days and hopefully at that time they can look at the results and decide if you need your antibiotic change.  Return to the emergency department if you get fever, vomiting, or if you get abdominal pain or swelling and are unable to urinate.

## 2019-12-13 NOTE — ED Provider Notes (Signed)
[facility] DEPT Provider Note   CSN: [id number] Arrival date & time: 12/12/19  1240   Time seen 12:50 AM  History Chief Complaint  Patient presents with  . Dysuria    Vanessa Beard is a 69 y.o. female.  HPI   Patient has a history of rectal cancer followed by Dr. Burr Medico.  She is currently having active surveillance.  She states she was seen on July 20 for hemorrhoid.  She was prescribed Lomotil for her complaints of diarrhea, Anusol HC and viscous lidocaine.  She was also advised to buy Tucks pads and do sitz bath's.  She has an appointment with a surgeon this week.  She states the following day she started having trouble urinating.  She states she would have to push hard for her urine to come out.  She denies dysuria or hematuria but states she has some frequency.  She denies abdominal pain, abdominal bloating, nausea, vomiting, fever, or hematuria.  She denies that the hemorrhoid is bleeding.  She states she gets increased hemorrhoid pain when she pushes to urinate.  She also has had a lack of appetite recently.  She states however prior to this she was having normal urination.  She has never had this problem before.  When I look at her chart she was treated with Macrobid in June for a UTI she states this feels different.  PCP Seward Carol, MD Oncology Dr Burr Medico  Past Medical History:  Diagnosis Date  . Allergic rhinitis   . Biliary colic   . Gall bladder disease \  . Hepatitis C   . Herpes   . HIV infection ([place])   . Hyperparathyroidism ([place])   . Hypertension   . Insomnia   . Osteoporosis   . Pneumonia    2010    Patient Active Problem List   Diagnosis Date Noted  . Anal cancer ([place]) 07/26/2019  . Rectal mass 07/14/2019  . Constipation 07/14/2019  . Anal fissure 07/14/2019  . Healthcare maintenance 06/30/2018  . Left knee pain 12/30/2017  . Left shoulder pain 12/30/2017  . Closed fracture of right tibial plateau 02/18/2017  . Benign  hypertension 02/15/2017  . Screening examination for venereal disease 03/06/2015  . Encounter for long-term (current) use of medications 03/06/2015  . Elevated LFTs 12/11/2014  . Abnormal ultrasound 12/11/2014  . Abdominal pain, epigastric 12/11/2014  . ASCUS of cervix with negative high risk HPV 10/31/2013  . Gall bladder disease 02/03/2013  . 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  . Compression fracture of L3 lumbar vertebra 07/01/2011  . Thyroid dysfunction 07/01/2011  . Anemia 07/01/2011  . Sore throat 04/02/2011  . Hyperparathyroidism 02/10/2011  . Osteopenia 02/03/2011  . PAP SMEAR, LGSIL, ABNORMAL 12/21/2009  . COUGH 07/18/2009  . Chronic hepatitis C virus infection ([place]) 12/19/2008  . LEUKORRHEA 04/07/2008  . FATIGUE 04/07/2008  . BACK PAIN, LUMBAR 11/10/2007  . ALLERGIC RHINITIS 07/09/2007  . PNEUMOCYSTIS PNEUMONIA 05/07/2007  . HYPERTENSION 05/07/2007  . DENTAL CARIES 05/07/2007  . INSOMNIA 05/07/2007  . PNEUMONIA, HX OF 05/07/2007  . HERPES ZOSTER, HX OF 05/07/2007  . Human immunodeficiency virus (HIV) disease ([place]) 08/04/2006    Past Surgical History:  Procedure Laterality Date  . CHOLECYSTECTOMY N/A 01/09/2015   Procedure: LAPAROSCOPIC CHOLECYSTECTOMY;  Surgeon: [name], MD;  Location: [facility];  Service: General;  Laterality: N/A;  . ECTOPIC PREGNANCY SURGERY    . ESOPHAGOGASTRODUODENOSCOPY (EGD) WITH PROPOFOL N/A 09/08/2019   Procedure: ESOPHAGOGASTRODUODENOSCOPY (EGD) WITH  PROPOFOL;  Surgeon: Milus Banister, MD;  Location: [facility];  Service: Endoscopy;  Laterality: N/A;  . IR FLUORO GUIDE CV LINE LEFT  08/15/2019  . ORIF TIBIA PLATEAU Right 02/18/2017   Procedure: OPEN REDUCTION INTERNAL FIXATION (ORIF) RIGHT TIBIAL PLATEAU;  Surgeon: Leandrew Koyanagi, MD;  Location: [facility];  Service: Orthopedics;  Laterality: Right;  . SHOULDER SURGERY Right 12/2014  . UPPER ESOPHAGEAL ENDOSCOPIC ULTRASOUND (EUS) N/A 09/08/2019   Procedure: UPPER  ESOPHAGEAL ENDOSCOPIC ULTRASOUND (EUS);  Surgeon: Milus Banister, MD;  Location: [facility];  Service: Endoscopy;  Laterality: N/A;     OB History    Gravida  5   Para  3   Term  3   Preterm  0   AB  2   Living  2     SAB  0   TAB  1   Ectopic  1   Multiple  0   Live Births              Family History  Problem Relation Age of Onset  . Diabetes Mother   . Hypertension Mother   . Bone cancer Half-Brother   . Prostate cancer Half-Brother   . Colon cancer Neg Hx   . Colon polyps Neg Hx   . Esophageal cancer Neg Hx   . Pancreatic cancer Neg Hx   . Stomach cancer Neg Hx   . Liver disease Neg Hx     Social History   Tobacco Use  . Smoking status: Never Smoker  . Smokeless tobacco: Never Used  Vaping Use  . Vaping Use: Never used  Substance Use Topics  . Alcohol use: Not Currently    Comment: occasionally   . Drug use: No    Home Medications Prior to Admission medications   Medication Sig Start Date End Date Taking? Authorizing Provider  acetaminophen (TYLENOL) 500 MG tablet Take 1,000 mg by mouth every 6 (six) hours as needed for moderate pain or headache.    [provider]  AMBULATORY NON FORMULARY MEDICATION Nitroglycerine ointment 0.125 %  Apply a pea sized amount internally four times daily. Patient not taking: Reported on 11/08/2019 08/01/19   Levin Erp, PA  amLODipine (NORVASC) 10 MG tablet Take 1 tablet (10 mg total) by mouth daily. Patient not taking: Reported on 11/08/2019 08/23/19   Carlyle Basques, MD  cephALEXin (KEFLEX) 500 MG capsule Take 1 capsule (500 mg total) by mouth 3 (three) times daily. 12/13/19   Rolland Porter, MD  diphenoxylate-atropine (LOMOTIL) 2.5-0.025 MG tablet Take 1-2 tablets by mouth 4 (four) times daily as needed for diarrhea or loose stools. 12/06/19   Tanner, Lyndon Code., PA-C  fluconazole (DIFLUCAN) 150 MG tablet Take 1 tablet (150 mg total) by mouth daily. Patient not taking: Reported on 11/02/2019 08/25/19    Hayden Pedro, PA-C  gabapentin (NEURONTIN) 300 MG capsule Take 1 capsule (300 mg total) by mouth 2 (two) times daily. 11/14/19   Truitt Merle, MD  Hydrocortisone Acetate 1 % OINT Apply 1 application topically 2 (two) times daily. 12/06/19   Tanner, Lyndon Code., PA-C  ibuprofen (ADVIL) 800 MG tablet TAKE 1 TABLET BY MOUTH EVERY 8 HOURS AS NEEDED Patient not taking: Reported on 11/08/2019 02/10/19   Aundra Dubin, PA-C  lidocaine (XYLOCAINE) 2 % solution Apply topically to anus as needed for pain relief 12/06/19   Harle Stanford., PA-C  nitrofurantoin, macrocrystal-monohydrate, (MACROBID) 100 MG capsule Take 1 capsule (100 mg total) by mouth  2 (two) times daily. Patient not taking: Reported on 11/02/2019 10/27/19   Alla Feeling, NP  ondansetron (ZOFRAN) 8 MG tablet Take 1 tablet (8 mg total) by mouth 2 (two) times daily as needed (Nausea or vomiting). 07/26/19   Truitt Merle, MD  oxyCODONE (OXY IR/ROXICODONE) 5 MG immediate release tablet Take 1 tablet (5 mg total) by mouth every 4 (four) hours as needed for severe pain. 12/06/19   Tanner, Lyndon Code., PA-C  potassium chloride SA (KLOR-CON) 20 MEQ tablet Take 1 tablet (20 mEq total) by mouth 2 (two) times daily. Patient not taking: Reported on 11/08/2019 09/19/19   Alla Feeling, NP  prochlorperazine (COMPAZINE) 10 MG tablet Take 1 tablet (10 mg total) by mouth every 6 (six) hours as needed (Nausea or vomiting). Patient not taking: Reported on 11/08/2019 07/26/19   Truitt Merle, MD  TRIUMEQ 600-50-300 MG tablet TAKE 1 TABLET BY MOUTH DAILY 12/08/19   Carlyle Basques, MD  valACYclovir (VALTREX) 1000 MG tablet Take 1 tablet (1,000 mg total) by mouth 3 (three) times daily. 11/08/19   Carlyle Basques, MD  zolpidem (AMBIEN) 10 MG tablet Take 1 tablet (10 mg total) by mouth at bedtime as needed. Patient taking differently: Take 10 mg by mouth at bedtime as needed for sleep.  07/01/17   Carlyle Basques, MD    Allergies    Dapsone, Retrovir [zidovudine], and  Sulfamethoxazole-trimethoprim  Review of Systems   Review of Systems  All other systems reviewed and are negative.   Physical Exam Updated Vital Signs BP 112/70 (BP Location: Right Arm)   Pulse 76   Temp 97.8 F (36.6 C)   Resp 18   SpO2 100%   Physical Exam Vitals and nursing note reviewed. Exam conducted with a chaperone present.  Constitutional:      Appearance: Normal appearance. She is normal weight.     Comments: Patient is laying on her left side.  HENT:     Head: Normocephalic and atraumatic.     Right Ear: External ear normal.     Left Ear: External ear normal.  Eyes:     Extraocular Movements: Extraocular movements intact.     Conjunctiva/sclera: Conjunctivae normal.  Cardiovascular:     Rate and Rhythm: Normal rate.  Pulmonary:     Effort: Pulmonary effort is normal. No respiratory distress.  Genitourinary:    Comments: Patient is noted to have a hemorrhoid from 12:00 to 3:00 that is pink in color and narrow, not bulging.  It does not appear to be thrombosed.  There are 2 round hypopigmented areas on either side of her buttocks that was noted in the chart last week. Musculoskeletal:     Cervical back: Normal range of motion.  Neurological:     Mental Status: She is alert.  Psychiatric:        Mood and Affect: Mood normal.        Behavior: Behavior normal.        Thought Content: Thought content normal.     ED Results / Procedures / Treatments   Labs (all labs ordered are listed, but only abnormal results are displayed) Results for orders placed or performed during the hospital encounter of 12/12/19  Urinalysis, Routine w reflex microscopic- may I&O cath if menses  Result Value Ref Range   Color, Urine AMBER (A) YELLOW   APPearance CLEAR CLEAR   Specific Gravity, Urine 1.021 1.005 - 1.030   pH 6.0 5.0 - 8.0   Glucose, UA NEGATIVE NEGATIVE  mg/dL   Hgb urine dipstick NEGATIVE NEGATIVE   Bilirubin Urine NEGATIVE NEGATIVE   Ketones, ur NEGATIVE NEGATIVE  mg/dL   Protein, ur 30 (A) NEGATIVE mg/dL   Nitrite NEGATIVE NEGATIVE   Leukocytes,Ua MODERATE (A) NEGATIVE   RBC / HPF 0-5 0 - 5 RBC/hpf   WBC, UA >50 (H) 0 - 5 WBC/hpf   Bacteria, UA RARE (A) NONE SEEN   Squamous Epithelial / LPF 0-5 0 - 5   Mucus PRESENT    Laboratory interpretation all normal except UTI    EKG None  Radiology No results found.    CT abdomen/chest with contrast, CT chest with contrast November 02, 2019 IMPRESSION: No acute findings in the chest, abdomen or pelvis.  Biapical and bibasilar scarring. Bronchiectasis and paraseptal emphysema in the lung bases.  Tortuous aorta.  Small umbilical hernia containing fat.  Electronically Signed: By: Rolm Baptise M.D. On: 11/02/2019 02:10  Nuclear medicine PET scan from skull to thigh August 04, 2019 IMPRESSION: Hypermetabolic anal soft tissue mass, consistent with known primary anal carcinoma.  Sub-cm hypermetabolic lymph nodes in posterior perirectal space, right inguinal region, right iliac chain, and gastrohepatic ligament, suspicious for metastatic disease.  Small hypermetabolic left axillary lymph nodes. This would be unusual location for metastatic anal carcinoma. Recommend clinical correlation for possibility the patient has had recent COVID vaccination in the left arm, which could explain this finding.  Multifocal airspace disease in both lower lungs with marked hypermetabolic activity. This favors infectious or inflammatory etiology, and is not typical for pulmonary metastases. Short-term follow-up by chest CT is recommended.   Electronically Signed   By: Marlaine Hind M.D.   On: 08/04/2019 14:28  Procedures Procedures (including critical care time)  Medications Ordered in ED Medications  cephALEXin (KEFLEX) capsule 500 mg (has no administration in time range)  oxyCODONE-acetaminophen (PERCOCET/ROXICET) 5-325 MG per tablet 1 tablet (1 tablet Oral Given 12/13/19 0212)    ED  Course  I have reviewed the triage vital signs and the nursing notes.  Pertinent labs & imaging results that were available during my care of the patient were reviewed by me and considered in my medical decision making (see chart for details).    MDM Rules/Calculators/A&P                          Patient states she has been taking more than her usual dose of oxycodone for her pain.  She is requesting pain medication in the ED because evidently she did not bring her pain medication with her.  Please note patient has been in the ED 12 hours and has yet to produce a urine sample.  Bladder scan was done around 1:00 this afternoon and was 21.  Patient urinated in the waiting room and did not give a sample.  She is currently trying to drink water to produce a sample.   Review of patient's chart shows she has a urine culture done on June 10 which showed 20,000 colonies of multiple species, repeat urine culture on July 25 again showed multiple species.  She was treated with Macrobid in June for presumed UTI.  She was started on oral Keflex and another urine culture was sent.  Patient's bladder scan at 1:00 AM was 100 cc after which she was able to produce a urine sample.  I consider doing CT of her pelvis to look for rectal mass however she does not have urinary retention.  Her urine  does look like a UTI and she does have a hemorrhoid which could be causing some interference with her urination due to increased pain.  I think I would just change her antibiotic and have her follow-up with her surgeon in 2 days.   Patient states she has an appointment on July 29 with her surgeon.  Final Clinical Impression(s) / ED Diagnoses Final diagnoses:  Urinary tract infection without hematuria, site unspecified  Hemorrhoids, unspecified hemorrhoid type    Rx / DC Orders ED Discharge Orders         Ordered    cephALEXin (KEFLEX) 500 MG capsule  3 times daily     Discontinue  Reprint     12/13/19 0249           Plan discharge  Rolland Porter, MD, Barbette Or, MD 12/13/19 [phone]

## 2019-12-14 LAB — URINE CULTURE

## 2019-12-15 ENCOUNTER — Telehealth: Payer: Self-pay

## 2019-12-15 ENCOUNTER — Ambulatory Visit: Payer: Medicare HMO

## 2019-12-15 NOTE — Telephone Encounter (Signed)
Vanessa Beard called stating she is taking xoycodone 3 tablets (15 mg) every 4-5 hours and it is not helping her rectal pain.She has been given lidocaine gel and hydrocortisone cream and these do not help.    She states she has been straining to urinate and this has irritated her rectum and hemorrhoids.  She was in the ED this week and dx with UTI.  She wants to know is there anything else she can use.    Reviewed with Dr. Burr Medico.  Vanessa Kaczynski scheduled to see [facility] tomorrow with labs before.  Vanessa Lupo verbalized understanding.

## 2019-12-16 ENCOUNTER — Inpatient Hospital Stay (HOSPITAL_BASED_OUTPATIENT_CLINIC_OR_DEPARTMENT_OTHER): Payer: Medicare HMO | Admitting: Physician Assistant

## 2019-12-16 ENCOUNTER — Other Ambulatory Visit: Payer: Self-pay | Admitting: *Deleted

## 2019-12-16 ENCOUNTER — Other Ambulatory Visit: Payer: Self-pay

## 2019-12-16 ENCOUNTER — Inpatient Hospital Stay: Payer: Medicare HMO

## 2019-12-16 VITALS — BP 115/88 | HR 93 | Temp 97.0°F | Resp 18 | Ht 60.0 in | Wt 148.5 lb

## 2019-12-16 DIAGNOSIS — Z9049 Acquired absence of other specified parts of digestive tract: Secondary | ICD-10-CM | POA: Diagnosis not present

## 2019-12-16 DIAGNOSIS — K649 Unspecified hemorrhoids: Secondary | ICD-10-CM | POA: Insufficient documentation

## 2019-12-16 DIAGNOSIS — K6289 Other specified diseases of anus and rectum: Secondary | ICD-10-CM | POA: Diagnosis not present

## 2019-12-16 DIAGNOSIS — K645 Perianal venous thrombosis: Secondary | ICD-10-CM | POA: Diagnosis not present

## 2019-12-16 DIAGNOSIS — Z808 Family history of malignant neoplasm of other organs or systems: Secondary | ICD-10-CM | POA: Diagnosis not present

## 2019-12-16 DIAGNOSIS — R69 Illness, unspecified: Secondary | ICD-10-CM | POA: Diagnosis not present

## 2019-12-16 DIAGNOSIS — Z79899 Other long term (current) drug therapy: Secondary | ICD-10-CM | POA: Diagnosis not present

## 2019-12-16 DIAGNOSIS — C210 Malignant neoplasm of anus, unspecified: Secondary | ICD-10-CM

## 2019-12-16 DIAGNOSIS — Z882 Allergy status to sulfonamides status: Secondary | ICD-10-CM | POA: Diagnosis not present

## 2019-12-16 DIAGNOSIS — Z8249 Family history of ischemic heart disease and other diseases of the circulatory system: Secondary | ICD-10-CM | POA: Diagnosis not present

## 2019-12-16 DIAGNOSIS — R197 Diarrhea, unspecified: Secondary | ICD-10-CM | POA: Diagnosis not present

## 2019-12-16 DIAGNOSIS — Z833 Family history of diabetes mellitus: Secondary | ICD-10-CM | POA: Diagnosis not present

## 2019-12-16 LAB — CMP (CANCER CENTER ONLY)
ALT: 7 U/L (ref 0–44)
AST: 25 U/L (ref 15–41)
Albumin: 2.9 g/dL — ABNORMAL LOW (ref 3.5–5.0)
Alkaline Phosphatase: 94 U/L (ref 38–126)
Anion gap: 11 (ref 5–15)
BUN: 10 mg/dL (ref 8–23)
CO2: 23 mmol/L (ref 22–32)
Calcium: 10 mg/dL (ref 8.9–10.3)
Chloride: 105 mmol/L (ref 98–111)
Creatinine: 0.9 mg/dL (ref 0.44–1.00)
GFR, Est AFR Am: >60 mL/min (ref >60)
GFR, Estimated: >60 mL/min (ref >60)
Glucose, Bld: 134 mg/dL — ABNORMAL HIGH (ref 70–99)
Potassium: 3.6 mmol/L (ref 3.5–5.1)
Sodium: 139 mmol/L (ref 135–145)
Total Bilirubin: 0.5 mg/dL (ref 0.3–1.2)
Total Protein: 8.3 g/dL — ABNORMAL HIGH (ref 6.5–8.1)

## 2019-12-16 LAB — CBC WITH DIFFERENTIAL (CANCER CENTER ONLY)
Abs Immature Granulocytes: 0.01 10*3/uL (ref 0.00–0.07)
Basophils Absolute: 0 10*3/uL (ref 0.0–0.1)
Basophils Relative: 0 %
Eosinophils Absolute: 0.3 10*3/uL (ref 0.0–0.5)
Eosinophils Relative: 7 %
HCT: 31.8 % — ABNORMAL LOW (ref 36.0–46.0)
Hemoglobin: 10.5 g/dL — ABNORMAL LOW (ref 12.0–15.0)
Immature Granulocytes: 0 %
Lymphocytes Relative: 23 %
Lymphs Abs: 1.1 10*3/uL (ref 0.7–4.0)
MCH: 34.4 pg — ABNORMAL HIGH (ref 26.0–34.0)
MCHC: 33 g/dL (ref 30.0–36.0)
MCV: 104.3 fL — ABNORMAL HIGH (ref 80.0–100.0)
Monocytes Absolute: 0.4 10*3/uL (ref 0.1–1.0)
Monocytes Relative: 8 %
Neutro Abs: 3.1 10*3/uL (ref 1.7–7.7)
Neutrophils Relative %: 62 %
Platelet Count: 182 10*3/uL (ref 150–400)
RBC: 3.05 MIL/uL — ABNORMAL LOW (ref 3.87–5.11)
RDW: 13.2 % (ref 11.5–15.5)
WBC Count: 5 10*3/uL (ref 4.0–10.5)
nRBC: 0 % (ref 0.0–0.2)

## 2019-12-16 MED ORDER — DULOXETINE HCL 20 MG PO CPEP: 20.0000 mg | ORAL_CAPSULE | Freq: Every day | ORAL | 2 refills | Status: DC

## 2019-12-16 MED ORDER — OXYCODONE HCL 5 MG PO TABS: 5.0000 mg | ORAL_TABLET | Freq: Four times a day (QID) | ORAL | 0 refills | Status: DC | PRN

## 2019-12-16 NOTE — Progress Notes (Signed)
[facility] OFFICE PROGRESS NOTE  Seward Carol, MD [place] [address] [place] [address]  DIAGNOSIS: F/u ofanal cancer  Oncology History Overview Note  Cancer Staging Anal cancer ([place]) Staging form: Anus, AJCC 8th Edition - Clinical: Stage IV (cT2, cN0, cM1) - Signed by Truitt Merle, MD on 08/12/2019    Anal cancer ([place])  07/19/2019 Procedure   Colonoscopy by Dr Havery Moros 07/19/19  IMPRESSION - Ulcer noted at the anal canal in posterior midline canal that extends into the distal rectum on digital rectal exam, nodular and somewhat hard to palpation. Endoscopic images show nodular tissue in the area, concerning for malignant ulcer. - The examined portion of the ileum was normal. - One 3 mm polyp at the hepatic flexure, removed with a cold snare. Resected and retrieved. - One 5 mm polyp in the transverse colon, removed with a cold snare. Resected and retrieved. - Diverticulosis in the ascending colon. - The examination was otherwise normal.   07/19/2019 Initial Biopsy   Diagnosis 07/19/19 1. Transverse Colon Polyp, hepatic flexure (2) - TUBULAR ADENOMA (1 OF 3 FRAGMENTS) - BENIGN COLONIC MUCOSA (2 OF 3 FRAGMENTS) - NO HIGH GRADE DYSPLASIA OR MALIGNANCY IDENTIFIED 2. 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   Sub-cm hypermetabolic lymph nodes in posterior perirectal space, right inguinal region, right iliac chain, and gastrohepatic ligament, suspicious for metastatic disease.   Small hypermetabolic left axillary lymph nodes. This would be unusual location for metastatic anal carcinoma. Recommend  clinical correlation for possibility the patient has had recent COVID vaccination in the left arm, which could explain this finding.   Multifocal airspace disease in both lower lungs with marked hypermetabolic activity. This favors infectious or inflammatory etiology, and is not typical for pulmonary metastases. Short-term follow-up by chest CT is recommended.   08/15/2019 - 10/05/2019 Radiation Therapy   Concurrent ChemoRT with Dr. Lisbeth Renshaw starting 08/15/19-10/05/19   08/15/2019 - 09/12/2019 Chemotherapy   Concurrent ChemoRT with Mitomycin and 5FU on week 1 and 5 starting 08/15/19 with week 5 dose on 09/12/19.    11/02/2019 Imaging   CT CAP w contrast  IMPRESSION: No acute findings in the chest, abdomen or pelvis.   Biapical and bibasilar scarring. Bronchiectasis and paraseptal emphysema in the lung bases.   Tortuous aorta.   Small umbilical hernia containing fat.     CURRENT THERAPY: Surveillance   INTERVAL HISTORY: Vanessa Beard 69 y.o. female returns to the clinic today for a follow-up visit for the chief complaint of rectal pain.  The patient has had been having rectal pain for several months. She completed her chemotherapy and radiotherapy in May 2021.  She rates her pain a 8/10 most of the time. Her pain is at its worse in the AM and in the PM. She describes it as a throbbing/stinging sensation. The pain is localized to the anus. She reports some associated itching and mild clear odorless drainage. Denies purulent drainage, fevers, chills, or night sweats. She had a CT scan in June 2021 which did not show any acute abnormalities in this area to explain her symptoms. She was referred to  a surgeon for her rectal pain for consideration of biopsy and anoscope; however, she did not show up for her appointments which were scheduled on 7/19 and 7/26. She confused her appointments with other appointments and seemed apologetic.  Her next appointment with her surgeon is not until 8/19.   The  patient has been seen in the interval since her last appointment on 7/7 for the same complaint.  She was seen on 12/06/2019 in the symptom management clinic.  Her exam was consistent with a thrombosed hemorrhoid. She was given a refill of her oxycodone.  She is also instructed to use Anusol, viscous lidocaine, sitz bath's, purchase Tucks pads, and she was given Lomotil for diarrhea secondary to her radiation and chemotherapy.  She is currently on surveillance for her anal cancer.  She is planning on having a restaging PET scan performed soon but it has not been scheduled yet. She also has been using preparation H. She has been taking 3 tablets of her 5 mg oxycodone tablets at least twice a day for the pain without significant relief. Last night, she also took 10 mg of oxycodone, ibuprofen, flexeril, and gabapentin. She is inquiring about what she can do for pain. It is affecting her ability to sit due to the pain.   She was also seen in the emergency room on 12/13/2019 for straining to pass urine.  Her exam also demonstrated a nonthrombosed hemorrhoid.  She had a urine culture performed which showed multiple species and recollection was recommended.  She denies any dysuria, abdominal bloating, nausea, vomiting, hematuria, or urinary retention.  The emergency room prescribed her Keflex which has been helping her symptoms. She still occasionally strains to urinate but it is improving.  Today, she denies any constipation.  She denies any rectal bleeding. She denies abdominal pain. Her last BM was two days ago and was soft. She sometimes has diarrhea which she attributes to her dietary habits, for example, she states apple juice causes diarrhea. She had met with the pelvic floor rehabilitation team; however, she has not started performing these exercises at this time. She denies symptoms of depression. She is here today for evaluation and further managment regarding her symptoms.   MEDICAL HISTORY: Past Medical  History:  Diagnosis Date  . Allergic rhinitis   . Biliary colic   . Gall bladder disease \  . Hepatitis C   . Herpes   . HIV infection ([place])   . Hyperparathyroidism ([place])   . Hypertension   . Insomnia   . Osteoporosis   . Pneumonia    2010    ALLERGIES:  is allergic to dapsone, retrovir [zidovudine], and sulfamethoxazole-trimethoprim.  MEDICATIONS:  Current Outpatient Medications  Medication Sig Dispense Refill  . acetaminophen (TYLENOL) 500 MG tablet Take 1,000 mg by mouth every 6 (six) hours as needed for moderate pain or headache.    . AMBULATORY NON FORMULARY MEDICATION Nitroglycerine ointment 0.125 %  Apply a pea sized amount internally four times daily. (Patient not taking: Reported on 11/08/2019) 30 g 1  . amLODipine (NORVASC) 10 MG tablet Take 1 tablet (10 mg total) by mouth daily. (Patient not taking: Reported on 11/08/2019) 90 tablet 3  . cephALEXin (KEFLEX) 500 MG capsule Take 1 capsule (500 mg total) by mouth 3 (three) times daily. 30 capsule 0  . diphenoxylate-atropine (LOMOTIL) 2.5-0.025 MG tablet Take 1-2 tablets by mouth 4 (four) times daily as needed for diarrhea or loose stools. 30 tablet 1  . DULoxetine (CYMBALTA) 20 MG  capsule Take 1 capsule (20 mg total) by mouth daily. 30 capsule 2  . fluconazole (DIFLUCAN) 150 MG tablet Take 1 tablet (150 mg total) by mouth daily. (Patient not taking: Reported on 11/02/2019) 2 tablet 0  . gabapentin (NEURONTIN) 300 MG capsule Take 1 capsule (300 mg total) by mouth 2 (two) times daily. 60 capsule 1  . Hydrocortisone Acetate 1 % OINT Apply 1 application topically 2 (two) times daily. 28.4 g 3  . ibuprofen (ADVIL) 800 MG tablet TAKE 1 TABLET BY MOUTH EVERY 8 HOURS AS NEEDED (Patient not taking: Reported on 11/08/2019) 90 tablet 0  . lidocaine (XYLOCAINE) 2 % solution Apply topically to anus as needed for pain relief 100 mL 1  . nitrofurantoin, macrocrystal-monohydrate, (MACROBID) 100 MG capsule Take 1 capsule (100 mg total) by mouth 2  (two) times daily. (Patient not taking: Reported on 11/02/2019) 10 capsule 0  . ondansetron (ZOFRAN) 8 MG tablet Take 1 tablet (8 mg total) by mouth 2 (two) times daily as needed (Nausea or vomiting). 30 tablet 1  . oxyCODONE (OXY IR/ROXICODONE) 5 MG immediate release tablet Take 1 tablet (5 mg total) by mouth every 6 (six) hours as needed for severe pain. 30 tablet 0  . potassium chloride SA (KLOR-CON) 20 MEQ tablet Take 1 tablet (20 mEq total) by mouth 2 (two) times daily. (Patient not taking: Reported on 11/08/2019) 30 tablet 0  . prochlorperazine (COMPAZINE) 10 MG tablet Take 1 tablet (10 mg total) by mouth every 6 (six) hours as needed (Nausea or vomiting). (Patient not taking: Reported on 11/08/2019) 30 tablet 1  . TRIUMEQ 600-50-300 MG tablet TAKE 1 TABLET BY MOUTH DAILY 30 tablet 3  . valACYclovir (VALTREX) 1000 MG tablet Take 1 tablet (1,000 mg total) by mouth 3 (three) times daily. 30 tablet 1  . zolpidem (AMBIEN) 10 MG tablet Take 1 tablet (10 mg total) by mouth at bedtime as needed. (Patient taking differently: Take 10 mg by mouth at bedtime as needed for sleep. ) 30 tablet 1   No current facility-administered medications for this visit.    SURGICAL HISTORY:  Past Surgical History:  Procedure Laterality Date  . CHOLECYSTECTOMY N/A 01/09/2015   Procedure: LAPAROSCOPIC CHOLECYSTECTOMY;  Surgeon: [name], MD;  Location: [facility];  Service: General;  Laterality: N/A;  . ECTOPIC PREGNANCY SURGERY    . ESOPHAGOGASTRODUODENOSCOPY (EGD) WITH PROPOFOL N/A 09/08/2019   Procedure: ESOPHAGOGASTRODUODENOSCOPY (EGD) WITH PROPOFOL;  Surgeon: Milus Banister, MD;  Location: [facility];  Service: Endoscopy;  Laterality: N/A;  . IR FLUORO GUIDE CV LINE LEFT  08/15/2019  . ORIF TIBIA PLATEAU Right 02/18/2017   Procedure: OPEN REDUCTION INTERNAL FIXATION (ORIF) RIGHT TIBIAL PLATEAU;  Surgeon: Leandrew Koyanagi, MD;  Location: [facility];  Service: Orthopedics;  Laterality: Right;  . SHOULDER SURGERY Right  12/2014  . UPPER ESOPHAGEAL ENDOSCOPIC ULTRASOUND (EUS) N/A 09/08/2019   Procedure: UPPER ESOPHAGEAL ENDOSCOPIC ULTRASOUND (EUS);  Surgeon: Milus Banister, MD;  Location: [facility];  Service: Endoscopy;  Laterality: N/A;    REVIEW OF SYSTEMS:   Review of Systems  Constitutional: Negative for appetite change, chills, fatigue, fever and unexpected weight change.  HENT: Negative for mouth sores, nosebleeds, sore throat and trouble swallowing.   Eyes: Negative for eye problems and icterus.  Respiratory: Negative for cough, hemoptysis, shortness of breath and wheezing.   Cardiovascular: Negative for chest pain and leg swelling.  Gastrointestinal: Positive for rectal pain. Positive for intermittent diarrhea (none at thie time). Negative for abdominal pain,  constipation, nausea and vomiting.  Genitourinary: Positive for straining with urination (improved). Negative for bladder incontinence, dysuria, frequency and hematuria.   Musculoskeletal: Negative for back pain, gait problem, neck pain and neck stiffness.  Skin: Negative for itching and rash.  Neurological: Negative for dizziness, extremity weakness, gait problem, headaches, light-headedness and seizures.  Hematological: Negative for adenopathy. Does not bruise/bleed easily.  Psychiatric/Behavioral: Negative for confusion, depression and sleep disturbance. The patient is not nervous/anxious.     PHYSICAL EXAMINATION:  Blood pressure (!) 115/88, pulse 93, temperature (!) 97 F (36.1 C), temperature source Temporal, resp. rate 18, height 5' (1.524 m), weight 148 lb 8 oz (67.4 kg), SpO2 95 %.  ECOG PERFORMANCE STATUS: 1 - Symptomatic but completely ambulatory  Physical Exam  Constitutional: Oriented to person, place, and time and well-developed, well-nourished, and in no distress.  HENT:  Head: Normocephalic and atraumatic.  Mouth/Throat: Oropharynx is clear and moist. No oropharyngeal exudate.  Eyes: Conjunctivae are normal. Right eye  exhibits no discharge. Left eye exhibits no discharge. No scleral icterus.  Neck: Normal range of motion. Neck supple.  Cardiovascular: Normal rate, regular rhythm, normal heart sounds and intact distal pulses.   Pulmonary/Chest: Effort normal and breath sounds normal. No respiratory distress. No wheezes. No rales.  Abdominal: Soft. Bowel sounds are normal. Exhibits no distension and no mass. There is no tenderness.  Musculoskeletal: Normal range of motion. Exhibits no edema.  Lymphadenopathy:    No cervical adenopathy.  Neurological: Alert and oriented to person, place, and time. Exhibits normal muscle tone. Gait normal. Coordination normal.  Skin: Skin is warm and dry. No rash noted. Not diaphoretic. No erythema. No pallor.  Psychiatric: Mood, memory and judgment normal.  Rectal: Non thrombosed hemorrhoid noted in the anterior anus. The 2 shallow bilateral medial ulcerations are improved compared to prior and non-tender. Mild clear rectal drainage. DRE: small anal nodule. No blood. Patient having tenderness in anterior anus. No fluctuance. No fistulas noted.  LABORATORY DATA: Lab Results  Component Value Date   WBC 5.0 12/16/2019   HGB 10.5 (L) 12/16/2019   HCT 31.8 (L) 12/16/2019   MCV 104.3 (H) 12/16/2019   PLT 182 12/16/2019      Chemistry      Component Value Date/Time   NA 139 12/16/2019 1316   NA 142 08/31/2013 1515   K 3.6 12/16/2019 1316   K 3.4 (L) 08/31/2013 1515   CL 105 12/16/2019 1316   CL 104 07/07/2012 1512   CO2 23 12/16/2019 1316   CO2 27 08/31/2013 1515   BUN 10 12/16/2019 1316   BUN 14.7 08/31/2013 1515   CREATININE 0.90 12/16/2019 1316   CREATININE 0.87 08/08/2019 1535   CREATININE 0.8 08/31/2013 1515      Component Value Date/Time   CALCIUM 10.0 12/16/2019 1316   CALCIUM 9.6 08/31/2013 1515   ALKPHOS 94 12/16/2019 1316   ALKPHOS 99 08/31/2013 1515   AST 25 12/16/2019 1316   AST 25 08/31/2013 1515   ALT 7 12/16/2019 1316   ALT 9 08/31/2013 1515    BILITOT 0.5 12/16/2019 1316   BILITOT 0.67 08/31/2013 1515       RADIOGRAPHIC STUDIES:  No results found.   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  She is status post 6 weeks of concurrent chemoradiation with Mitomycin and 5FU. This was completed in 10/05/19  The patient continues to have significant rectal pain.  The patient's exam was consistent with a nonthrombosed hemorrhoid.  A CT scan from June 2021 did not note any other acute abnormalities in this region.  The patient was scheduled to see a surgeon to evaluate her rectal pain.  However, she did not show up to her appointment as there was some confusion on the patient's part with the appointment time.  Her rescheduled appointment is not until 01/05/2020.   The patient was seen with Dr. Burr Medico today.  No other abnormalities were noted on exam today; however, we would still recommend further evaluation and consideration of anoscope to rule out any other etiologies of her rectal pain.  Dr. Burr Medico discussed with the patient that it is unusual to have this much pain this far out from chemoradiation.  We will reach out to surgeon, Dr. Marcello Moores, to see if the patient can be evaluated any sooner.  Her appointment has been moved to 12/21/2019.  In the meantime, the patient is nontoxic-appearing and in no acute distress.  She denies any signs and symptoms of infection including leukocytosis, fevers, chills, night sweats, or purulent discharge.  To further evaluate her condition, we will also move her upcoming restaging PET scan to be performed earlier (12/21/19).  We will reschedule her follow-up with [place] to review these results a few days following her PET scan.   For relief of her symptoms, Dr. Burr Medico had a lengthy discussion with the patient. regarding the oxycodone use.  Dr. Burr Medico would like the patient to taper off oxycodone completely.  She also  discussed that higher doses than prescribed are likely not effective.  She recommends starting Cymbalta 20 mg to help with her pain.  We will send a refill of oxycodone for 30 tablets to use sparingly if needed with the goal to taper off completely.  Dr. Burr Medico strongly encourage the patient to perform the pelvic floor exercises as recommended by the pelvic floor rehabilitation team.  She will also continue with sitz baths, topical lidocaine, Anusol, Preparation H, and Tucks pads for symptomatic relief of her symptoms.   Plan: -Reach out to Dr. Manon Hilding office to have the patient evaluated sooner. Appointment has been rescheduled to 8/4 from 8/16. -refill oxycodone with the intent of tapering off completely. -Prescribed 20 mg of Cymbalta -Reschedule PET scan 12/21/2019. -Encouraged the patient to continue with pelvic floor rehabilitation exercises. -Continue with sitz baths, topical lidocaine, Anusol, Preparation H, Tucks pads, for symptomatic relief of her hemorrhoids. -Follow-up with Dr. Burr Medico after her PET scan    No orders of the defined types were placed in this encounter.    Clearence Vitug L Jashayla Glatfelter, PA-C 12/16/19  Addendum  I have seen the patient, examined her. I agree with the assessment and and plan and have edited the notes.   Pt completed chemoRT about 2.5 months ago, but she still has significant rectal pain, and uncomfortable to sit.  Exam was unremarkable.  This is somewhat unusual, concerning for residual disease vs pain medication (narcotics) related.  I will move her restaging PET scan early to next week, and check with colorectal surgeon Dr. Marcello Moores to see if she can see her sooner for rectal exam and anoscopy.  We had a long discussion about narcotics, she is on moderate to high dose, and it will lower her threshold to pain sensation.  I recommend adding Cymbalta, and gradually decrease her narcotics dose.  She agrees to try. Will see her back after PET san.   Truitt Merle   12/16/2019

## 2019-12-16 NOTE — Progress Notes (Signed)
Made pt aware at today's appt of PET scan appt for 8/4 at 2pm. Advised to arrive at 130pm, water only 6 hrs prior to appt and no carbs nor sugars the night before the scan. Pt verbalized understanding. Also made pt appt with Dr.Rossel and [facility] with Dr.Akey for 8/4 at 830am. Left voice message

## 2019-12-18 ENCOUNTER — Encounter: Payer: Self-pay | Admitting: Physician Assistant

## 2019-12-19 ENCOUNTER — Telehealth: Payer: Self-pay | Admitting: Hematology

## 2019-12-20 ENCOUNTER — Telehealth: Payer: Self-pay | Admitting: Physician Assistant

## 2019-12-20 NOTE — Telephone Encounter (Signed)
Moved 8/18 appt to 8/6per los. Called and spoke with patient. Confirmed appt

## 2019-12-21 ENCOUNTER — Ambulatory Visit (HOSPITAL_COMMUNITY): Payer: Medicare HMO

## 2019-12-23 ENCOUNTER — Telehealth: Payer: Self-pay

## 2019-12-23 ENCOUNTER — Inpatient Hospital Stay: Payer: Medicare HMO

## 2019-12-23 ENCOUNTER — Other Ambulatory Visit: Payer: Self-pay

## 2019-12-23 ENCOUNTER — Inpatient Hospital Stay: Payer: Medicare HMO | Admitting: Hematology

## 2019-12-23 ENCOUNTER — Encounter (HOSPITAL_COMMUNITY): Payer: Self-pay

## 2019-12-23 ENCOUNTER — Telehealth: Payer: Self-pay | Admitting: Hematology

## 2019-12-23 ENCOUNTER — Emergency Department (HOSPITAL_COMMUNITY)
Admission: EM | Admit: 2019-12-23 | Discharge: 2019-12-23 | Disposition: A | Discharge status: Home / Self Care | Payer: Medicare HMO | Attending: Emergency Medicine | Admitting: Emergency Medicine

## 2019-12-23 DIAGNOSIS — Z5321 Procedure and treatment not carried out due to patient leaving prior to being seen by health care provider: Secondary | ICD-10-CM | POA: Insufficient documentation

## 2019-12-23 DIAGNOSIS — K649 Unspecified hemorrhoids: Secondary | ICD-10-CM | POA: Diagnosis not present

## 2019-12-23 HISTORY — DX: Malignant (primary) neoplasm, unspecified: C80.1

## 2019-12-23 NOTE — ED Triage Notes (Signed)
Arrived POV from home. Patient sent by Dr Meribeth Mattes center) for assessment of Hemorrhoids. Patient reports she was supposed to have some CT scans today also, but was too sick to go to visit. Patient has been using Preparation H but it has not helped with Hemorrhoids

## 2019-12-23 NOTE — Telephone Encounter (Signed)
Scheduled appt per 8/6 sch msg - pt is aware.

## 2019-12-23 NOTE — Telephone Encounter (Signed)
Vanessa Beard called to cancel appts today as she is having rectal pain and going to the ED.  I requested she go to [facility].  I gave her the new appt time for her pet scan and reviewed instructions.  She verbalized understanding.

## 2019-12-24 ENCOUNTER — Emergency Department (HOSPITAL_COMMUNITY)
Admission: EM | Admit: 2019-12-24 | Discharge: 2019-12-24 | Disposition: A | Discharge status: Home / Self Care | Payer: Medicare HMO | Attending: Emergency Medicine | Admitting: Emergency Medicine

## 2019-12-24 ENCOUNTER — Encounter (HOSPITAL_COMMUNITY): Payer: Self-pay | Admitting: Emergency Medicine

## 2019-12-24 ENCOUNTER — Other Ambulatory Visit: Payer: Self-pay

## 2019-12-24 DIAGNOSIS — I10 Essential (primary) hypertension: Secondary | ICD-10-CM | POA: Diagnosis not present

## 2019-12-24 DIAGNOSIS — K6289 Other specified diseases of anus and rectum: Secondary | ICD-10-CM | POA: Diagnosis not present

## 2019-12-24 DIAGNOSIS — Z79899 Other long term (current) drug therapy: Secondary | ICD-10-CM | POA: Insufficient documentation

## 2019-12-24 MED ORDER — HYDROCORTISONE ACETATE 25 MG RE SUPP
25.0000 mg | Freq: Two times a day (BID) | RECTAL | 0 refills | Status: DC
Start: 2019-12-24 — End: 2019-12-26

## 2019-12-24 MED ORDER — OXYCODONE-ACETAMINOPHEN 5-325 MG PO TABS: 1.0000 | ORAL_TABLET | ORAL | 0 refills | Status: DC | PRN

## 2019-12-24 MED ORDER — HYDROCORTISONE ACETATE 25 MG RE SUPP
25.0000 mg | Freq: Once | RECTAL | Status: AC
  Administered 2019-12-24: 25 mg via RECTAL
  Filled 2019-12-24 (×2): qty 1

## 2019-12-24 MED ORDER — HYDROCORTISONE ACETATE 25 MG RE SUPP
25.0000 mg | Freq: Two times a day (BID) | RECTAL | 0 refills | Status: DC
Start: 2019-12-24 — End: 2019-12-24

## 2019-12-24 MED ORDER — OXYCODONE-ACETAMINOPHEN 5-325 MG PO TABS
1.0000 | ORAL_TABLET | Freq: Once | ORAL | Status: AC
  Administered 2019-12-24: 1 via ORAL
  Filled 2019-12-24: qty 1

## 2019-12-24 NOTE — Discharge Instructions (Addendum)
Start the suppository treatment, tonight. Use it twice a day to help with the inflammation in the rectum. Inflammation is likely from your prior radiation treatment. You do not have hemorrhoids that are causing your pain. Your urinary bladder did not show excessive amounts of urine. Do not try to push urine out, when you're voiding. Soak in a warm tub 2-3 times a day for 30 minutes to help the discomfort. Follow-up with your doctors for further care and treatment.

## 2019-12-24 NOTE — Progress Notes (Signed)
  Radiation Oncology         [phone] ________________________________  Name: Vanessa Beard MRN: [id number]  Date: 10/05/2019  DOB: [birth year]  End of Treatment Note  Diagnosis: anal cancer     Indication for treatment:  curative       Radiation treatment dates:   08/15/19 - 10/05/19  Site/dose:    1.  The patient was treated with a course of IMRT using a simultaneous integrated boost technique. Daily image guidance was using during the treatment. The high dose region received a total of 54 Gy.  2. Concurrently, the patient was treated to an isolated abdominal node to a dose of 54 Gy in 30 fractions using a 4-field hybrid plan.    Narrative: The patient tolerated radiation treatment relatively well.   The patient experienced skin irritation as expected by the end of treatment.   Plan: The patient has completed radiation treatment. The patient will return to radiation oncology clinic for routine followup in one month. I advised the patient to call or return sooner if they have any questions or concerns related to their recovery or treatment. ________________________________  Jodelle Gross, M.D., Ph.D.

## 2019-12-24 NOTE — ED Triage Notes (Signed)
Patient here from home reporting hemorrhoids. Reports radiation to area. Hx of anal cancer.

## 2019-12-24 NOTE — ED Provider Notes (Signed)
[facility] DEPT Provider Note   CSN: [id number] Arrival date & time: 12/24/19  9518     History Chief Complaint  Patient presents with  . Hemorrhoids    Vanessa Beard is a 70 y.o. female.  HPI She presents for evaluation of rectal pain and concern for hemorrhoids.  She has been straining to urinate recently and feels like "something popped out."  She states that she has scarring from prior anal cancer treated with chemotherapy and radiation.  She denies fever, chills, vomiting, dizziness, cough or chest pain.  She has been using over-the-counter hemorrhoidal cream without relief.  There are no other known modifying factors.    Past Medical History:  Diagnosis Date  . Allergic rhinitis   . Biliary colic   . Cancer ([place])   . Gall bladder disease \  . Hepatitis C   . Herpes   . HIV infection ([place])   . Hyperparathyroidism ([place])   . Hypertension   . Insomnia   . Osteoporosis   . Pneumonia    2010    Patient Active Problem List   Diagnosis Date Noted  . Hemorrhoid 12/16/2019  . Rectal pain 12/16/2019  . Anal cancer ([place]) 07/26/2019  . Rectal mass 07/14/2019  . Constipation 07/14/2019  . Anal fissure 07/14/2019  . Healthcare maintenance 06/30/2018  . Left knee pain 12/30/2017  . Left shoulder pain 12/30/2017  . Closed fracture of right tibial plateau 02/18/2017  . Benign hypertension 02/15/2017  . Screening examination for venereal disease 03/06/2015  . Encounter for long-term (current) use of medications 03/06/2015  . Elevated LFTs 12/11/2014  . Abnormal ultrasound 12/11/2014  . Abdominal pain, epigastric 12/11/2014  . ASCUS of cervix with negative high risk HPV 10/31/2013  . Gall bladder disease 02/03/2013  . 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  . Compression fracture of L3 lumbar vertebra 07/01/2011  . Thyroid dysfunction 07/01/2011  . Anemia 07/01/2011  . Sore throat 04/02/2011  .  Hyperparathyroidism 02/10/2011  . Osteopenia 02/03/2011  . PAP SMEAR, LGSIL, ABNORMAL 12/21/2009  . COUGH 07/18/2009  . Chronic hepatitis C virus infection ([place]) 12/19/2008  . LEUKORRHEA 04/07/2008  . FATIGUE 04/07/2008  . BACK PAIN, LUMBAR 11/10/2007  . ALLERGIC RHINITIS 07/09/2007  . PNEUMOCYSTIS PNEUMONIA 05/07/2007  . HYPERTENSION 05/07/2007  . DENTAL CARIES 05/07/2007  . INSOMNIA 05/07/2007  . PNEUMONIA, HX OF 05/07/2007  . HERPES ZOSTER, HX OF 05/07/2007  . Human immunodeficiency virus (HIV) disease ([place]) 08/04/2006    Past Surgical History:  Procedure Laterality Date  . CHOLECYSTECTOMY N/A 01/09/2015   Procedure: LAPAROSCOPIC CHOLECYSTECTOMY;  Surgeon: [name], MD;  Location: [facility];  Service: General;  Laterality: N/A;  . ECTOPIC PREGNANCY SURGERY    . ESOPHAGOGASTRODUODENOSCOPY (EGD) WITH PROPOFOL N/A 09/08/2019   Procedure: ESOPHAGOGASTRODUODENOSCOPY (EGD) WITH PROPOFOL;  Surgeon: Milus Banister, MD;  Location: [facility];  Service: Endoscopy;  Laterality: N/A;  . IR FLUORO GUIDE CV LINE LEFT  08/15/2019  . ORIF TIBIA PLATEAU Right 02/18/2017   Procedure: OPEN REDUCTION INTERNAL FIXATION (ORIF) RIGHT TIBIAL PLATEAU;  Surgeon: Leandrew Koyanagi, MD;  Location: [facility];  Service: Orthopedics;  Laterality: Right;  . SHOULDER SURGERY Right 12/2014  . UPPER ESOPHAGEAL ENDOSCOPIC ULTRASOUND (EUS) N/A 09/08/2019   Procedure: UPPER ESOPHAGEAL ENDOSCOPIC ULTRASOUND (EUS);  Surgeon: Milus Banister, MD;  Location: [facility];  Service: Endoscopy;  Laterality: N/A;     OB History    Gravida  5   Para  3  Term  3   Preterm  0   AB  2   Living  2     SAB  0   TAB  1   Ectopic  1   Multiple  0   Live Births              Family History  Problem Relation Age of Onset  . Diabetes Mother   . Hypertension Mother   . Bone cancer Half-Brother   . Prostate cancer Half-Brother   . Colon cancer Neg Hx   . Colon polyps Neg Hx   . Esophageal cancer Neg Hx   .  Pancreatic cancer Neg Hx   . Stomach cancer Neg Hx   . Liver disease Neg Hx     Social History   Tobacco Use  . Smoking status: Never Smoker  . Smokeless tobacco: Never Used  Vaping Use  . Vaping Use: Never used  Substance Use Topics  . Alcohol use: Not Currently    Comment: occasionally   . Drug use: No    Home Medications Prior to Admission medications   Medication Sig Start Date End Date Taking? Authorizing Provider  cephALEXin (KEFLEX) 500 MG capsule Take 1 capsule (500 mg total) by mouth 3 (three) times daily. 12/13/19  Yes Rolland Porter, MD  diphenoxylate-atropine (LOMOTIL) 2.5-0.025 MG tablet Take 1-2 tablets by mouth 4 (four) times daily as needed for diarrhea or loose stools. 12/06/19  Yes Tanner, Lyndon Code., PA-C  DULoxetine (CYMBALTA) 20 MG capsule Take 1 capsule (20 mg total) by mouth daily. 12/16/19  Yes Heilingoetter, Cassandra L, PA-C  hydrocortisone 1 % ointment Apply 1 application topically daily as needed for hemorrhoids. 12/06/19  Yes [provider]  lidocaine (XYLOCAINE) 2 % solution Apply topically to anus as needed for pain relief 12/06/19  Yes Tanner, Lyndon Code., PA-C  ondansetron (ZOFRAN) 8 MG tablet Take 1 tablet (8 mg total) by mouth 2 (two) times daily as needed (Nausea or vomiting). 07/26/19  Yes Truitt Merle, MD  oxyCODONE (OXY IR/ROXICODONE) 5 MG immediate release tablet Take 1 tablet (5 mg total) by mouth every 6 (six) hours as needed for severe pain. 12/16/19  Yes Heilingoetter, Cassandra L, PA-C  TRIUMEQ 600-50-300 MG tablet TAKE 1 TABLET BY MOUTH DAILY Patient taking differently: Take 1 tablet by mouth daily.  12/08/19  Yes Carlyle Basques, MD  valACYclovir (VALTREX) 1000 MG tablet Take 1 tablet (1,000 mg total) by mouth 3 (three) times daily. Patient taking differently: Take 1,000 mg by mouth daily.  11/08/19  Yes Carlyle Basques, MD  zolpidem (AMBIEN) 10 MG tablet Take 1 tablet (10 mg total) by mouth at bedtime as needed. Patient taking differently: Take 10 mg  by mouth at bedtime as needed for sleep.  07/01/17  Yes Carlyle Basques, MD  AMBULATORY NON FORMULARY MEDICATION Nitroglycerine ointment 0.125 %  Apply a pea sized amount internally four times daily. Patient not taking: Reported on 11/08/2019 08/01/19   Levin Erp, PA  amLODipine (NORVASC) 10 MG tablet Take 1 tablet (10 mg total) by mouth daily. Patient not taking: Reported on 11/08/2019 08/23/19   Carlyle Basques, MD  hydrocortisone (ANUSOL-HC) 25 MG suppository Place 1 suppository (25 mg total) rectally 2 (two) times daily. For 7 days 12/24/19   Daleen Bo, MD  oxyCODONE-acetaminophen (PERCOCET) 5-325 MG tablet Take 1 tablet by mouth every 4 (four) hours as needed. 12/24/19   Daleen Bo, MD  prochlorperazine (COMPAZINE) 10 MG tablet Take 1 tablet (10 mg  total) by mouth every 6 (six) hours as needed (Nausea or vomiting). Patient not taking: Reported on 11/08/2019 07/26/19   Truitt Merle, MD    Allergies    Dapsone, Retrovir [zidovudine], and Sulfamethoxazole-trimethoprim  Review of Systems   Review of Systems  All other systems reviewed and are negative.   Physical Exam Updated Vital Signs BP (!) 141/91   Pulse 85   Temp 97.6 F (36.4 C) (Oral)   Resp 20   Ht 5' (1.524 m)   Wt 70.3 kg   SpO2 93%   BMI 30.27 kg/m   Physical Exam Vitals and nursing note reviewed.  Constitutional:      General: She is not in acute distress.    Appearance: She is well-developed. She is not ill-appearing, toxic-appearing or diaphoretic.  HENT:     Head: Normocephalic and atraumatic.     Right Ear: External ear normal.     Left Ear: External ear normal.  Eyes:     Conjunctiva/sclera: Conjunctivae normal.     Pupils: Pupils are equal, round, and reactive to light.  Neck:     Trachea: Phonation normal.  Cardiovascular:     Rate and Rhythm: Normal rate.  Pulmonary:     Effort: Pulmonary effort is normal.  Abdominal:     General: There is no distension.  Genitourinary:    Comments:  Normal external anus.  Digital rectal examination, have normal contour and texture of anal/rectal canal is present.  This area feels thickened, nondistensible, but is patent to a digital examination.  She experienced moderate pain on digital examination.  There is no rectal mass or bleeding appreciated. Small light brown stool in rectum. Musculoskeletal:        General: Normal range of motion.     Cervical back: Normal range of motion and neck supple.  Skin:    General: Skin is warm and dry.  Neurological:     Mental Status: She is alert and oriented to person, place, and time.     Cranial Nerves: No cranial nerve deficit.     Sensory: No sensory deficit.     Motor: No abnormal muscle tone.     Coordination: Coordination normal.  Psychiatric:        Mood and Affect: Mood normal.        Behavior: Behavior normal.        Thought Content: Thought content normal.        Judgment: Judgment normal.     ED Results / Procedures / Treatments   Labs (all labs ordered are listed, but only abnormal results are displayed) Labs Reviewed - No data to display  EKG None  Radiology No results found.  Procedures Procedures (including critical care time)  Medications Ordered in ED Medications  hydrocortisone (ANUSOL-HC) suppository 25 mg (has no administration in time range)  oxyCODONE-acetaminophen (PERCOCET/ROXICET) 5-325 MG per tablet 1 tablet (1 tablet Oral Given 12/24/19 1415)    ED Course  I have reviewed the triage vital signs and the nursing notes.  Pertinent labs & imaging results that were available during my care of the patient were reviewed by me and considered in my medical decision making (see chart for details).    MDM Rules/Calculators/A&P                           Patient Vitals for the past 24 hrs:  BP Temp Temp src Pulse Resp SpO2 Height Weight  12/24/19 1445 (!)  141/91 -- -- 85 20 93 % -- --  12/24/19 1229 120/77 97.6 F (36.4 C) Oral 91 20 97 % 5' (1.524 m) 70.3  kg  12/24/19 0857 107/84 97.7 F (36.5 C) Oral 94 18 100 % -- --    3:08 PM Reevaluation with update and discussion. After initial assessment and treatment, an updated evaluation reveals pain stable but at this time, no further complaints. Findings discussed with patient and family member with her, all questions were answered. Daleen Bo   Medical Decision Making:  This patient is presenting for evaluation of anal/rectal pain, which does require a range of treatment options, and is a complaint that involves a moderate risk of morbidity and mortality. The differential diagnoses include hemorrhoid, anal fissure, proctitis, constipation, nonspecific urinary outlet obstruction or other urinary process. I decided to review old records, and in summary elderly female presenting with anal/rectal pain and difficulty urinating.  I did not require additional historical information from anyone.    Critical Interventions-clinical evaluation, medication treatment, observation reassessment  After These Interventions, the Patient was reevaluated and was found stable for discharge. Patient with anal/rectal discomfort from prior radiation treatment. No indication for hemorrhoids, external or internal or causing her discomfort. Doubt proctitis, serious infection or active cancer.  CRITICAL CARE-no Performed by: Daleen Bo  Nursing Notes Reviewed/ Care Coordinated Applicable Imaging Reviewed Interpretation of Laboratory Data incorporated into ED treatment  The patient appears reasonably screened and/or stabilized for discharge and I doubt any other medical condition or other [facility] requiring further screening, evaluation, or treatment in the ED at this time prior to discharge.  Plan: Home Medications-continue usual medications; Home Treatments-and soaks 2-3 times a day for discomfort; return here if the recommended treatment, does not improve the symptoms; Recommended follow up-PCP and oncology follow-up  as planned as needed.     Final Clinical Impression(s) / ED Diagnoses Final diagnoses:  Rectal inflammation    Rx / DC Orders ED Discharge Orders         Ordered    hydrocortisone (ANUSOL-HC) 25 MG suppository  2 times daily,   Status:  Discontinued     Reprint     12/24/19 1503    oxyCODONE-acetaminophen (PERCOCET) 5-325 MG tablet  Every 4 hours PRN,   Status:  Discontinued     Reprint     12/24/19 1503    hydrocortisone (ANUSOL-HC) 25 MG suppository  2 times daily     Discontinue  Reprint     12/24/19 1505    oxyCODONE-acetaminophen (PERCOCET) 5-325 MG tablet  Every 4 hours PRN     Discontinue  Reprint     12/24/19 1505           Daleen Bo, MD 12/24/19 [phone]

## 2019-12-26 ENCOUNTER — Telehealth: Payer: Self-pay

## 2019-12-26 ENCOUNTER — Ambulatory Visit: Payer: Medicare HMO

## 2019-12-26 ENCOUNTER — Other Ambulatory Visit: Payer: Self-pay | Admitting: Radiation Oncology

## 2019-12-26 MED ORDER — HYDROCORTISONE ACETATE 25 MG RE SUPP
25.0000 mg | Freq: Two times a day (BID) | RECTAL | 1 refills | Status: DC
Start: 2019-12-26 — End: 2020-03-11

## 2019-12-26 NOTE — Telephone Encounter (Signed)
Spoke with patient in regards to message left on 12/25/19. Patient states that she has had a sudden onset of rectal pain, constipation, straining during bowel movements, rectal pressure and fullness. Patient stated no relief from sitz bath or enema. Patient went to the ER and received oxycodone for pain and hydrocodone suppositories. Patient states that there is pain and a bump on the outside of her rectum.  Per Vanessa Simpson PA advise patient to take milk of magnesia 2 times per day until bowel start moving and then go back to the Miralax. Advised patient that Vanessa Beard will call in Anusol and to also get over the counter Dermoplast or neosporin. Advised patient that I will reach out to her tomorrow in regards to having relief. Patient verbalized understanding of information given.

## 2019-12-28 ENCOUNTER — Telehealth: Payer: Self-pay

## 2019-12-28 NOTE — Telephone Encounter (Signed)
Vanessa Beard left vm asking for a call back.  I called back no answer left vm. I spoke with Vanessa Beard she is still having significant rectal pain.  The anusol suppositories burn and do not help the pain.  She has been taking the Cymbalta without difficulty but it has not helped.  She states she missed her appt with Dr. Dema Severin.  Per Dr. Burr Medico Vanessa Beard is to increase cymbalta to 40 mg daily.  She is to reschedule her appt with Dr. Dema Severin.  I gave his office number to her.  I reminded her of her upcoming lab and PET scan appt.  She verbalized understanding to all of the above.

## 2019-12-29 ENCOUNTER — Encounter (HOSPITAL_COMMUNITY): Payer: Medicare HMO

## 2019-12-30 ENCOUNTER — Inpatient Hospital Stay: Payer: Medicare HMO | Attending: Hematology | Admitting: Nurse Practitioner

## 2019-12-30 ENCOUNTER — Telehealth: Payer: Self-pay

## 2019-12-30 ENCOUNTER — Encounter (HOSPITAL_COMMUNITY): Payer: Self-pay | Admitting: *Deleted

## 2019-12-30 ENCOUNTER — Ambulatory Visit (HOSPITAL_COMMUNITY)
Admission: RE | Admit: 2019-12-30 | Discharge: 2019-12-30 | Disposition: A | Discharge status: Home / Self Care | Payer: Medicare HMO | Source: Ambulatory Visit | Attending: Hematology | Admitting: Hematology

## 2019-12-30 ENCOUNTER — Emergency Department (HOSPITAL_COMMUNITY)
Admission: EM | Admit: 2019-12-30 | Discharge: 2019-12-30 | Disposition: A | Discharge status: Home / Self Care | Payer: Medicare HMO | Attending: Emergency Medicine | Admitting: Emergency Medicine

## 2019-12-30 ENCOUNTER — Other Ambulatory Visit: Payer: Self-pay

## 2019-12-30 ENCOUNTER — Encounter (HOSPITAL_COMMUNITY): Payer: Self-pay

## 2019-12-30 ENCOUNTER — Other Ambulatory Visit: Payer: Self-pay | Admitting: Hematology

## 2019-12-30 DIAGNOSIS — J439 Emphysema, unspecified: Secondary | ICD-10-CM | POA: Insufficient documentation

## 2019-12-30 DIAGNOSIS — Z9049 Acquired absence of other specified parts of digestive tract: Secondary | ICD-10-CM | POA: Insufficient documentation

## 2019-12-30 DIAGNOSIS — J479 Bronchiectasis, uncomplicated: Secondary | ICD-10-CM | POA: Insufficient documentation

## 2019-12-30 DIAGNOSIS — E213 Hyperparathyroidism, unspecified: Secondary | ICD-10-CM | POA: Diagnosis not present

## 2019-12-30 DIAGNOSIS — Z79899 Other long term (current) drug therapy: Secondary | ICD-10-CM | POA: Insufficient documentation

## 2019-12-30 DIAGNOSIS — C210 Malignant neoplasm of anus, unspecified: Secondary | ICD-10-CM | POA: Insufficient documentation

## 2019-12-30 DIAGNOSIS — I10 Essential (primary) hypertension: Secondary | ICD-10-CM | POA: Insufficient documentation

## 2019-12-30 DIAGNOSIS — K5900 Constipation, unspecified: Secondary | ICD-10-CM | POA: Diagnosis not present

## 2019-12-30 DIAGNOSIS — K6289 Other specified diseases of anus and rectum: Secondary | ICD-10-CM | POA: Insufficient documentation

## 2019-12-30 DIAGNOSIS — D123 Benign neoplasm of transverse colon: Secondary | ICD-10-CM | POA: Insufficient documentation

## 2019-12-30 DIAGNOSIS — R634 Abnormal weight loss: Secondary | ICD-10-CM | POA: Insufficient documentation

## 2019-12-30 DIAGNOSIS — R197 Diarrhea, unspecified: Secondary | ICD-10-CM | POA: Insufficient documentation

## 2019-12-30 DIAGNOSIS — I2510 Atherosclerotic heart disease of native coronary artery without angina pectoris: Secondary | ICD-10-CM | POA: Diagnosis not present

## 2019-12-30 DIAGNOSIS — B1920 Unspecified viral hepatitis C without hepatic coma: Secondary | ICD-10-CM | POA: Diagnosis not present

## 2019-12-30 DIAGNOSIS — R400 Somnolence: Secondary | ICD-10-CM | POA: Insufficient documentation

## 2019-12-30 DIAGNOSIS — Z21 Asymptomatic human immunodeficiency virus [HIV] infection status: Secondary | ICD-10-CM | POA: Insufficient documentation

## 2019-12-30 DIAGNOSIS — R69 Illness, unspecified: Secondary | ICD-10-CM | POA: Diagnosis not present

## 2019-12-30 DIAGNOSIS — Z923 Personal history of irradiation: Secondary | ICD-10-CM | POA: Insufficient documentation

## 2019-12-30 DIAGNOSIS — R918 Other nonspecific abnormal finding of lung field: Secondary | ICD-10-CM | POA: Diagnosis not present

## 2019-12-30 DIAGNOSIS — Z9221 Personal history of antineoplastic chemotherapy: Secondary | ICD-10-CM | POA: Insufficient documentation

## 2019-12-30 DIAGNOSIS — K429 Umbilical hernia without obstruction or gangrene: Secondary | ICD-10-CM | POA: Diagnosis not present

## 2019-12-30 DIAGNOSIS — Z881 Allergy status to other antibiotic agents status: Secondary | ICD-10-CM | POA: Diagnosis not present

## 2019-12-30 DIAGNOSIS — D649 Anemia, unspecified: Secondary | ICD-10-CM | POA: Diagnosis not present

## 2019-12-30 DIAGNOSIS — M810 Age-related osteoporosis without current pathological fracture: Secondary | ICD-10-CM | POA: Insufficient documentation

## 2019-12-30 DIAGNOSIS — B20 Human immunodeficiency virus [HIV] disease: Secondary | ICD-10-CM | POA: Insufficient documentation

## 2019-12-30 DIAGNOSIS — D259 Leiomyoma of uterus, unspecified: Secondary | ICD-10-CM | POA: Insufficient documentation

## 2019-12-30 LAB — CBC WITH DIFFERENTIAL (CANCER CENTER ONLY)
Abs Immature Granulocytes: 0.02 10*3/uL (ref 0.00–0.07)
Basophils Absolute: 0 10*3/uL (ref 0.0–0.1)
Basophils Relative: 0 %
Eosinophils Absolute: 0.3 10*3/uL (ref 0.0–0.5)
Eosinophils Relative: 4 %
HCT: 33.8 % — ABNORMAL LOW (ref 36.0–46.0)
Hemoglobin: 11.3 g/dL — ABNORMAL LOW (ref 12.0–15.0)
Immature Granulocytes: 0 %
Lymphocytes Relative: 18 %
Lymphs Abs: 1.2 10*3/uL (ref 0.7–4.0)
MCH: 33.4 pg (ref 26.0–34.0)
MCHC: 33.4 g/dL (ref 30.0–36.0)
MCV: 100 fL (ref 80.0–100.0)
Monocytes Absolute: 0.5 10*3/uL (ref 0.1–1.0)
Monocytes Relative: 8 %
Neutro Abs: 4.6 10*3/uL (ref 1.7–7.7)
Neutrophils Relative %: 70 %
Platelet Count: 195 10*3/uL (ref 150–400)
RBC: 3.38 MIL/uL — ABNORMAL LOW (ref 3.87–5.11)
RDW: 12.2 % (ref 11.5–15.5)
WBC Count: 6.6 10*3/uL (ref 4.0–10.5)
nRBC: 0 % (ref 0.0–0.2)

## 2019-12-30 LAB — CMP (CANCER CENTER ONLY)
ALT: 6 U/L (ref 0–44)
AST: 23 U/L (ref 15–41)
Albumin: 2.9 g/dL — ABNORMAL LOW (ref 3.5–5.0)
Alkaline Phosphatase: 92 U/L (ref 38–126)
Anion gap: 12 (ref 5–15)
BUN: 9 mg/dL (ref 8–23)
CO2: 20 mmol/L — ABNORMAL LOW (ref 22–32)
Calcium: 10.8 mg/dL — ABNORMAL HIGH (ref 8.9–10.3)
Chloride: 105 mmol/L (ref 98–111)
Creatinine: 0.89 mg/dL (ref 0.44–1.00)
GFR, Est AFR Am: >60 mL/min (ref >60)
GFR, Estimated: >60 mL/min (ref >60)
Glucose, Bld: 129 mg/dL — ABNORMAL HIGH (ref 70–99)
Potassium: 2.9 mmol/L — CL (ref 3.5–5.1)
Sodium: 137 mmol/L (ref 135–145)
Total Bilirubin: 0.4 mg/dL (ref 0.3–1.2)
Total Protein: 8.3 g/dL — ABNORMAL HIGH (ref 6.5–8.1)

## 2019-12-30 MED ORDER — OXYCODONE-ACETAMINOPHEN 5-325 MG PO TABS
1.0000 | ORAL_TABLET | Freq: Once | ORAL | Status: AC
  Administered 2019-12-30: 1 via ORAL
  Filled 2019-12-30: qty 1

## 2019-12-30 MED ORDER — OXYCODONE HCL 5 MG PO TABS: 5.0000 mg | ORAL_TABLET | Freq: Four times a day (QID) | ORAL | 0 refills | Status: DC | PRN

## 2019-12-30 NOTE — ED Notes (Signed)
Patient will wait in a hall bed post administration until safe to drive home.

## 2019-12-30 NOTE — Telephone Encounter (Signed)
I called Ms Vanessa Beard back, no answer and mail box was full. I called pt and was able to leave a VM for her. I saw pt today in ED, she missed her appointment with Dr. Dema Severin today and PET scan, she will call to reschedule them. I told her that I am very concerned about her rectal pain is likely related to residual cancer, we need PET scan and further rectal exam to confirm before we can offer more cancer treatment. I encouraged pt to call me when she run out pain meds (she is taking more than what prescribed), and come to see [place] (we offer same day appointment) instead of going to ED frequently. She voiced good understand and agree with the plan. I will see if I can find a pain specialist to see her.   Truitt Merle  12/30/2019

## 2019-12-30 NOTE — ED Notes (Signed)
Pt tearful in hallway, asking to speak with provider regarding PO pain medication not working.  EDP Wentz made aware.

## 2019-12-30 NOTE — ED Provider Notes (Signed)
[facility] DEPT Provider Note   CSN: [id number] Arrival date & time: 12/30/19  1054     History Chief Complaint  Patient presents with   Rectal Pain    Vanessa Beard is a 69 y.o. female.  HPI She presents for evaluation of rectal pain.  She is being followed closely by oncology for same, was did have a PET scan today but decided come here instead.  She states that she had too much pain to go through the CAT scan.  She is on chronic narcotic treatment for this and her oncologist wants to wean off it and take only Cymbalta or Tylenol for pain.  She has missed appointments with surgery, referred to them for evaluation of anal/rectal pain.  I saw the patient for same problem, 1 week ago.  At that time I prescribed hydrocortisone suppositories and oxycodone.  She is using oxycodone frequently.  She has no other questions or concerns at this time.    Past Medical History:  Diagnosis Date   Allergic rhinitis    Biliary colic    Cancer ([place])    Gall bladder disease \   Hepatitis C    Herpes    HIV infection ([place])    Hyperparathyroidism ([place])    Hypertension    Insomnia    Osteoporosis    Pneumonia    2010    Patient Active Problem List   Diagnosis Date Noted   Hemorrhoid 12/16/2019   Rectal pain 12/16/2019   Anal cancer ([place]) 07/26/2019   Rectal mass 07/14/2019   Constipation 07/14/2019   Anal fissure 07/14/2019   Healthcare maintenance 06/30/2018   Left knee pain 12/30/2017   Left shoulder pain 12/30/2017   Closed fracture of right tibial plateau 02/18/2017   Benign hypertension 02/15/2017   Screening examination for venereal disease 03/06/2015   Encounter for long-term (current) use of medications 03/06/2015   Elevated LFTs 12/11/2014   Abnormal ultrasound 12/11/2014   Abdominal pain, epigastric 12/11/2014   ASCUS of cervix with negative high risk HPV 10/31/2013   Gall bladder disease 02/03/2013    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   Compression fracture of L3 lumbar vertebra 07/01/2011   Thyroid dysfunction 07/01/2011   Anemia 07/01/2011   Sore throat 04/02/2011   Hyperparathyroidism 02/10/2011   Osteopenia 02/03/2011   PAP SMEAR, LGSIL, ABNORMAL 12/21/2009   COUGH 07/18/2009   Chronic hepatitis C virus infection ([place]) 12/19/2008   LEUKORRHEA 04/07/2008   FATIGUE 04/07/2008   BACK PAIN, LUMBAR 11/10/2007   ALLERGIC RHINITIS 07/09/2007   PNEUMOCYSTIS PNEUMONIA 05/07/2007   HYPERTENSION 05/07/2007   DENTAL CARIES 05/07/2007   INSOMNIA 05/07/2007   PNEUMONIA, HX OF 05/07/2007   HERPES ZOSTER, HX OF 05/07/2007   Human immunodeficiency virus (HIV) disease ([place]) 08/04/2006    Past Surgical History:  Procedure Laterality Date   CHOLECYSTECTOMY N/A 01/09/2015   Procedure: LAPAROSCOPIC CHOLECYSTECTOMY;  Surgeon: [name], MD;  Location: [facility];  Service: General;  Laterality: N/A;   ECTOPIC PREGNANCY SURGERY     ESOPHAGOGASTRODUODENOSCOPY (EGD) WITH PROPOFOL N/A 09/08/2019   Procedure: ESOPHAGOGASTRODUODENOSCOPY (EGD) WITH PROPOFOL;  Surgeon: Milus Banister, MD;  Location: [facility];  Service: Endoscopy;  Laterality: N/A;   IR FLUORO GUIDE CV LINE LEFT  08/15/2019   ORIF TIBIA PLATEAU Right 02/18/2017   Procedure: OPEN REDUCTION INTERNAL FIXATION (ORIF) RIGHT TIBIAL PLATEAU;  Surgeon: Leandrew Koyanagi, MD;  Location: [facility];  Service: Orthopedics;  Laterality: Right;   SHOULDER  SURGERY Right 12/2014   UPPER ESOPHAGEAL ENDOSCOPIC ULTRASOUND (EUS) N/A 09/08/2019   Procedure: UPPER ESOPHAGEAL ENDOSCOPIC ULTRASOUND (EUS);  Surgeon: Milus Banister, MD;  Location: [facility];  Service: Endoscopy;  Laterality: N/A;     OB History    Gravida  5   Para  3   Term  3   Preterm  0   AB  2   Living  2     SAB  0   TAB  1   Ectopic  1   Multiple  0   Live Births              Family History  Problem  Relation Age of Onset   Diabetes Mother    Hypertension Mother    Bone cancer Half-Brother    Prostate cancer Half-Brother    Colon cancer Neg Hx    Colon polyps Neg Hx    Esophageal cancer Neg Hx    Pancreatic cancer Neg Hx    Stomach cancer Neg Hx    Liver disease Neg Hx     Social History   Tobacco Use   Smoking status: Never Smoker   Smokeless tobacco: Never Used  Vaping Use   Vaping Use: Never used  Substance Use Topics   Alcohol use: Not Currently    Comment: occasionally    Drug use: No    Home Medications Prior to Admission medications   Medication Sig Start Date End Date Taking? Authorizing Provider  AMBULATORY NON FORMULARY MEDICATION Nitroglycerine ointment 0.125 %  Apply a pea sized amount internally four times daily. Patient not taking: Reported on 11/08/2019 08/01/19   Levin Erp, PA  amLODipine (NORVASC) 10 MG tablet Take 1 tablet (10 mg total) by mouth daily. Patient not taking: Reported on 11/08/2019 08/23/19   Carlyle Basques, MD  cephALEXin (KEFLEX) 500 MG capsule Take 1 capsule (500 mg total) by mouth 3 (three) times daily. 12/13/19   Rolland Porter, MD  diphenoxylate-atropine (LOMOTIL) 2.5-0.025 MG tablet Take 1-2 tablets by mouth 4 (four) times daily as needed for diarrhea or loose stools. 12/06/19   Tanner, Lyndon Code., PA-C  DULoxetine (CYMBALTA) 20 MG capsule Take 1 capsule (20 mg total) by mouth daily. 12/16/19   Heilingoetter, Cassandra L, PA-C  hydrocortisone (ANUSOL-HC) 25 MG suppository Place 1 suppository (25 mg total) rectally 2 (two) times daily. 12/26/19   Hayden Pedro, PA-C  hydrocortisone 1 % ointment Apply 1 application topically daily as needed for hemorrhoids. 12/06/19   [provider]  lidocaine (XYLOCAINE) 2 % solution Apply topically to anus as needed for pain relief 12/06/19   Tanner, Lyndon Code., PA-C  ondansetron (ZOFRAN) 8 MG tablet Take 1 tablet (8 mg total) by mouth 2 (two) times daily as needed (Nausea or  vomiting). 07/26/19   Truitt Merle, MD  oxyCODONE (OXY IR/ROXICODONE) 5 MG immediate release tablet Take 1 tablet (5 mg total) by mouth every 6 (six) hours as needed for severe pain. 12/16/19   Heilingoetter, Cassandra L, PA-C  oxyCODONE-acetaminophen (PERCOCET) 5-325 MG tablet Take 1 tablet by mouth every 4 (four) hours as needed. 12/24/19   Daleen Bo, MD  prochlorperazine (COMPAZINE) 10 MG tablet Take 1 tablet (10 mg total) by mouth every 6 (six) hours as needed (Nausea or vomiting). Patient not taking: Reported on 11/08/2019 07/26/19   Truitt Merle, MD  [place] 600-50-300 MG tablet TAKE 1 TABLET BY MOUTH DAILY Patient taking differently: Take 1 tablet by mouth daily.  12/08/19  Carlyle Basques, MD  valACYclovir (VALTREX) 1000 MG tablet Take 1 tablet (1,000 mg total) by mouth 3 (three) times daily. Patient taking differently: Take 1,000 mg by mouth daily.  11/08/19   Carlyle Basques, MD  zolpidem (AMBIEN) 10 MG tablet Take 1 tablet (10 mg total) by mouth at bedtime as needed. Patient taking differently: Take 10 mg by mouth at bedtime as needed for sleep.  07/01/17   Carlyle Basques, MD    Allergies    Dapsone, Retrovir [zidovudine], and Sulfamethoxazole-trimethoprim  Review of Systems   Review of Systems  All other systems reviewed and are negative.   Physical Exam Updated Vital Signs BP 120/89 (BP Location: Right Arm)    Pulse 96    Temp 98.4 F (36.9 C) (Oral)    Resp 18    Ht 5' (1.524 m)    Wt 70.3 kg    SpO2 97%    BMI 30.27 kg/m   Physical Exam Vitals and nursing note reviewed.  Constitutional:      General: She is not in acute distress.    Appearance: She is well-developed. She is not ill-appearing.  HENT:     Head: Normocephalic and atraumatic.     Right Ear: External ear normal.     Left Ear: External ear normal.  Eyes:     Conjunctiva/sclera: Conjunctivae normal.     Pupils: Pupils are equal, round, and reactive to light.  Neck:     Trachea: Phonation normal.    Cardiovascular:     Rate and Rhythm: Normal rate.  Pulmonary:     Effort: Pulmonary effort is normal. No respiratory distress.     Breath sounds: No stridor.  Musculoskeletal:        General: Normal range of motion.     Cervical back: Normal range of motion and neck supple.  Skin:    General: Skin is warm and dry.  Neurological:     Mental Status: She is alert and oriented to person, place, and time.     Cranial Nerves: No cranial nerve deficit.     Sensory: No sensory deficit.     Motor: No abnormal muscle tone.     Coordination: Coordination normal.  Psychiatric:        Mood and Affect: Mood normal.        Behavior: Behavior normal.        Thought Content: Thought content normal.        Judgment: Judgment normal.     ED Results / Procedures / Treatments   Labs (all labs ordered are listed, but only abnormal results are displayed) Labs Reviewed - No data to display  EKG None  Radiology No results found.  Procedures Procedures (including critical care time)  Medications Ordered in ED Medications  oxyCODONE-acetaminophen (PERCOCET/ROXICET) 5-325 MG per tablet 1 tablet (has no administration in time range)    ED Course  I have reviewed the triage vital signs and the nursing notes.  Pertinent labs & imaging results that were available during my care of the patient were reviewed by me and considered in my medical decision making (see chart for details).    MDM Rules/Calculators/A&P                           Patient Vitals for the past 24 hrs:  BP Temp Temp src Pulse Resp SpO2 Height Weight  12/30/19 1132 120/89 98.4 F (36.9 C) Oral 96 18 -- -- --  12/30/19 1111 -- -- -- -- -- -- 5' (1.524 m) 70.3 kg  12/30/19 1110 124/86 98 F (36.7 C) Oral (!) 102 20 97 % -- --    1:20 PM Reevaluation with update and discussion. After initial assessment and treatment, an updated evaluation reveals no change in status, findings cussed with patient all questions were  answered. Daleen Bo   Medical Decision Making:  This patient is presenting for evaluation of ongoing anal/rectal pain, which does not require a range of treatment options, and is not a complaint that involves a high risk of morbidity and mortality. The differential diagnoses include malingering, anal cancer, anal spasm. I decided to review old records, and in summary patient has had comprehensive treatment for anal cancer, within the last few months, has been referred to general surgery but not on it for unknown reasons.  Her oncologist is trying to wean her off narcotics, which he is using for this same pain.  I do not require additional historical information from anyone.  Critical Interventions-clinical evaluation, discussion with patient  After These Interventions, the Patient was reevaluated and was found stable for discharge.  I offered her an oxycodone prior to discharge.  She is encouraged not to drive while taking the oxycodone.  CRITICAL CARE-no Performed by: Daleen Bo  Nursing Notes Reviewed/ Care Coordinated Applicable Imaging Reviewed Interpretation of Laboratory Data incorporated into ED treatment  The patient appears reasonably screened and/or stabilized for discharge and I doubt any other medical condition or other [facility] requiring further screening, evaluation, or treatment in the ED at this time prior to discharge.  Plan: Home Medications-continue usual; Home Treatments-rest, fluids; return here if the recommended treatment, does not improve the symptoms; Recommended follow up-oncology and general surgery soon as possible     Final Clinical Impression(s) / ED Diagnoses Final diagnoses:  Rectal pain    Rx / DC Orders ED Discharge Orders    None       Daleen Bo, MD 12/30/19 1323

## 2019-12-30 NOTE — Telephone Encounter (Signed)
Ms Vanessa Beard called requesting refill of oxycodone.  She states ED staff will not give her a prescription for oxycodone.

## 2019-12-30 NOTE — ED Triage Notes (Signed)
Pt complains of anal pain, has hx of anal cancer with last radiation in May. Pain became worse 2 weeks ago. She is scheduled to have CT today but states pain is too bad.

## 2019-12-30 NOTE — Telephone Encounter (Signed)
Ms Shouse's niece Ms matthews left vm requesting call back. Stating Ms Newhall is in pain and at the hospital and they aren't doing anything for her.  I attempted to call her back she did not answer and the mailbox was full.

## 2019-12-30 NOTE — ED Notes (Addendum)
Patient requesting additional pain medication. Per MD, patient is cleared for discharge. Reviewed discharge paperwork and follow-up with patient.

## 2019-12-30 NOTE — Discharge Instructions (Signed)
Follow-up with your surgeon Dr. Dema Severin as soon as possible for evaluation of the anal and rectal pain.  Follow-up with Dr. Burr Medico for ongoing management of your anal cancer.  Call Dr. Ernestina Penna office to reschedule be CT scan that was missed today.

## 2020-01-02 ENCOUNTER — Other Ambulatory Visit: Payer: Self-pay | Admitting: Radiation Therapy

## 2020-01-02 ENCOUNTER — Other Ambulatory Visit: Payer: Self-pay | Admitting: Hematology

## 2020-01-02 ENCOUNTER — Telehealth: Payer: Self-pay | Admitting: Hematology

## 2020-01-02 ENCOUNTER — Telehealth: Payer: Self-pay

## 2020-01-02 ENCOUNTER — Telehealth: Payer: Self-pay | Admitting: Physician Assistant

## 2020-01-02 ENCOUNTER — Inpatient Hospital Stay: Payer: Medicare HMO | Admitting: Physician Assistant

## 2020-01-02 DIAGNOSIS — C210 Malignant neoplasm of anus, unspecified: Secondary | ICD-10-CM

## 2020-01-02 DIAGNOSIS — K6289 Other specified diseases of anus and rectum: Secondary | ICD-10-CM

## 2020-01-02 MED ORDER — OXYCODONE HCL 5 MG PO TABS: 5.0000 mg | ORAL_TABLET | Freq: Four times a day (QID) | ORAL | 0 refills | Status: DC | PRN

## 2020-01-02 NOTE — Telephone Encounter (Signed)
Scheduled appt per 8/16 sch msg - pt is aware of appt date and time

## 2020-01-02 NOTE — Telephone Encounter (Signed)
Spoke with pt concerning lab results pt is aware they reviewed labs with her in er she does have potassium at home and was encouraged to take as directed pt expressed an understanding and states she will take her medication as ordered nothing further call ended

## 2020-01-02 NOTE — Telephone Encounter (Signed)
-----   Message from Alla Feeling, NP sent at 01/01/2020  8:16 PM EDT ----- My nurse,  I'm not sure who ordered the labs, appears she was in ED on 8/13 but labs came to my in-basket. Please call to see if she is on oral potassium, if not I will send in Rx for 20 mEq BID if this has not been addressed already.  Thanks, Regan Rakers NP

## 2020-01-02 NOTE — Telephone Encounter (Signed)
I called the patient to let her know that I rescheduled her PET scan for her on 8/19. She was instructed to arrive at 7:30 AM to the first floor of the radiology department at [facility]. Discussed no carbs 12 hours before her scan and NPO except for water 6 hours before her scan. I also spent several extra minutes reinforcing the importance for following up with her PET scan as well as appointments with surgeons regarding the etiology of her pain so we can best help manage her pain. I discussed that Dr. Burr Medico has refilled her pain medication and that it is available at the pharmacy. It appears that her potassium was low at 2.9 last week on routine labs. I did not see potassium on her medication list but the patient assured me that she had plenty of potassium at home. I offered to send a refill if she did not have 20 meq tablets at home. I discussed that potassium is important for cardiac function and that low potassium can result in arrythmias which it is why it is important to take it as prescribed. She expressed understanding.

## 2020-01-04 ENCOUNTER — Ambulatory Visit: Payer: Medicare HMO | Admitting: Hematology

## 2020-01-04 ENCOUNTER — Other Ambulatory Visit: Payer: Medicare HMO

## 2020-01-05 ENCOUNTER — Ambulatory Visit (HOSPITAL_COMMUNITY)
Admission: RE | Admit: 2020-01-05 | Discharge: 2020-01-05 | Disposition: A | Discharge status: Home / Self Care | Payer: Medicare HMO | Source: Ambulatory Visit | Attending: Hematology | Admitting: Hematology

## 2020-01-05 ENCOUNTER — Other Ambulatory Visit: Payer: Self-pay

## 2020-01-05 DIAGNOSIS — I700 Atherosclerosis of aorta: Secondary | ICD-10-CM | POA: Diagnosis not present

## 2020-01-05 DIAGNOSIS — J439 Emphysema, unspecified: Secondary | ICD-10-CM | POA: Diagnosis not present

## 2020-01-05 DIAGNOSIS — Z5111 Encounter for antineoplastic chemotherapy: Secondary | ICD-10-CM | POA: Diagnosis not present

## 2020-01-05 DIAGNOSIS — C210 Malignant neoplasm of anus, unspecified: Secondary | ICD-10-CM | POA: Insufficient documentation

## 2020-01-05 DIAGNOSIS — D259 Leiomyoma of uterus, unspecified: Secondary | ICD-10-CM | POA: Diagnosis not present

## 2020-01-05 LAB — GLUCOSE, CAPILLARY: Glucose-Capillary: 102 mg/dL — ABNORMAL HIGH (ref 70–99)

## 2020-01-05 MED ORDER — FLUDEOXYGLUCOSE F - 18 (FDG) INJECTION
7.2000 | Freq: Once | INTRAVENOUS | Status: AC | PRN
  Administered 2020-01-05: 7.2 via INTRAVENOUS

## 2020-01-05 NOTE — Progress Notes (Signed)
[facility] OFFICE PROGRESS NOTE  Seward Carol, MD [place] [address] [place] [address]  DIAGNOSIS: F/u ofanal cancer  Oncology History Overview Note  Cancer Staging Anal cancer ([place]) Staging form: Anus, AJCC 8th Edition - Clinical: Stage IV (cT2, cN0, cM1) - Signed by Truitt Merle, MD on 08/12/2019    Anal cancer ([place])  07/19/2019 Procedure   Colonoscopy by Dr Havery Moros 07/19/19  IMPRESSION - Ulcer noted at the anal canal in posterior midline canal that extends into the distal rectum on digital rectal exam, nodular and somewhat hard to palpation. Endoscopic images show nodular tissue in the area, concerning for malignant ulcer. - The examined portion of the ileum was normal. - One 3 mm polyp at the hepatic flexure, removed with a cold snare. Resected and retrieved. - One 5 mm polyp in the transverse colon, removed with a cold snare. Resected and retrieved. - Diverticulosis in the ascending colon. - The examination was otherwise normal.   07/19/2019 Initial Biopsy   Diagnosis 07/19/19 1. Transverse Colon Polyp, hepatic flexure (2) - TUBULAR ADENOMA (1 OF 3 FRAGMENTS) - BENIGN COLONIC MUCOSA (2 OF 3 FRAGMENTS) - NO HIGH GRADE DYSPLASIA OR MALIGNANCY IDENTIFIED 2. Rectum, biopsy, distal rectal anal canal - SQUAMOUS CELL CARCINOMA - SEE COMMENT   07/26/2019 Initial Diagnosis   Anal cancer ([place])   07/26/2019 Cancer Staging   Staging form: Anus, AJCC 8th Edition - Clinical: Stage IV (cT2, cN1c, cM1) - Signed by Truitt Merle, MD on 08/12/2019   08/04/2019 PET scan   IMPRESSION: Hypermetabolic anal soft tissue mass, consistent with known primary anal carcinoma.   Sub-cm hypermetabolic lymph nodes in posterior perirectal space, right inguinal region, right iliac chain, and gastrohepatic ligament, suspicious for metastatic disease.   Small hypermetabolic left axillary lymph nodes. This would be unusual location for metastatic anal carcinoma. Recommend  clinical correlation for possibility the patient has had recent COVID vaccination in the left arm, which could explain this finding.   Multifocal airspace disease in both lower lungs with marked hypermetabolic activity. This favors infectious or inflammatory etiology, and is not typical for pulmonary metastases. Short-term follow-up by chest CT is recommended.   08/15/2019 - 10/05/2019 Radiation Therapy   Concurrent ChemoRT with Dr. Lisbeth Renshaw starting 08/15/19-10/05/19   08/15/2019 - 09/12/2019 Chemotherapy   Concurrent ChemoRT with Mitomycin and 5FU on week 1 and 5 starting 08/15/19 with week 5 dose on 09/12/19.    11/02/2019 Imaging   CT CAP w contrast  IMPRESSION: No acute findings in the chest, abdomen or pelvis.   Biapical and bibasilar scarring. Bronchiectasis and paraseptal emphysema in the lung bases.   Tortuous aorta.   Small umbilical hernia containing fat.     CURRENT THERAPY: Surveillance   INTERVAL HISTORY: SAYA MCCOLL 69 y.o. female returns to the clinic today for a follow-up visit. The patient has had been having rectal pain for several months which worsened after completing treatment. She completed her chemotherapy and radiotherapy in May 2021.  She rates her pain a 8/10 most of the time. Her pain is at its worse in the AM and in the PM. She describes it as a throbbing/stinging sensation. The pain is localized to the anus. She reports some associated itching and mild clear odorless drainage. Denies purulent drainage, fevers, chills, or night sweats. She had a CT scan in June 2021 which did not show any acute abnormalities in this area to explain her symptoms. She was referred to a surgeon for  her rectal pain for consideration of biopsy and anoscope; however, she did not show up for her appointments which were scheduled on 7/19 and 7/26, 8/4. She saw Dr. Dema Severin yesterday on 01/05/20. He palpated the previously noted 2 small nodules in her rectum.    In the interval since her  last appointment with me on 7/30, she has been seen multiple times in the emergency room secondary to her rectal pain. She was seen on 12/06/2019 in the symptom management clinic.  Her exam was consistent with a thrombosed hemorrhoid. She was given a refill of her oxycodone.  She is also instructed to use Anusol, viscous lidocaine, sitz bath's, purchase Tucks pads, and she was given Lomotil for diarrhea secondary to her radiation and chemotherapy. She was also seen in the ER on 8/7 and 8/13. Dr. Burr Medico discussed that she should call our office as we offer same day appointments instead of going to the emergency room.   Otherwise today, she denies any recent rectal bleeding or abdominal pain.  The patient reports constipation.  She states that her last bowel movement was approximately 1 week ago.  She has not tried using a stool softener or laxative.  Her constipation is likely secondary to her pain medication use.  She reportedly takes oxycodone 1-2 times per day.  She states that she took an oxycodone this morning as well as Aleve.  She rates her pain a 7 out of 10 at this time.  She also lost weight in the interval since her last treatment.  She lost 8 pounds.  She believes that this is secondary to eating less due to pain.  The patient is here today for evaluation and to review the results of her staging PET scan to restage her condition and to further evaluate her rectal pain.    MEDICAL HISTORY: Past Medical History:  Diagnosis Date  . Allergic rhinitis   . Biliary colic   . Cancer ([place])   . Gall bladder disease \  . Hepatitis C   . Herpes   . HIV infection ([place])   . Hyperparathyroidism ([place])   . Hypertension   . Insomnia   . Osteoporosis   . Pneumonia    2010    ALLERGIES:  is allergic to dapsone, retrovir [zidovudine], and sulfamethoxazole-trimethoprim.  MEDICATIONS:  Current Outpatient Medications  Medication Sig Dispense Refill  . AMBULATORY NON FORMULARY MEDICATION Nitroglycerine  ointment 0.125 %  Apply a pea sized amount internally four times daily. (Patient not taking: Reported on 11/08/2019) 30 g 1  . amLODipine (NORVASC) 10 MG tablet Take 1 tablet (10 mg total) by mouth daily. (Patient not taking: Reported on 11/08/2019) 90 tablet 3  . diphenoxylate-atropine (LOMOTIL) 2.5-0.025 MG tablet Take 1-2 tablets by mouth 4 (four) times daily as needed for diarrhea or loose stools. 30 tablet 1  . DULoxetine (CYMBALTA) 20 MG capsule Take 1 capsule (20 mg total) by mouth daily. 30 capsule 2  . hydrocortisone (ANUSOL-HC) 25 MG suppository Place 1 suppository (25 mg total) rectally 2 (two) times daily. 24 suppository 1  . hydrocortisone 1 % ointment Apply 1 application topically daily as needed for hemorrhoids.    . lidocaine (XYLOCAINE) 2 % solution Apply topically to anus as needed for pain relief 100 mL 1  . ondansetron (ZOFRAN) 8 MG tablet Take 1 tablet (8 mg total) by mouth 2 (two) times daily as needed (Nausea or vomiting). 30 tablet 2  . oxyCODONE (OXY IR/ROXICODONE) 5 MG immediate release tablet Take  1 tablet (5 mg total) by mouth every 6 (six) hours as needed for severe pain. 60 tablet 0  . oxyCODONE-acetaminophen (PERCOCET) 5-325 MG tablet Take 1 tablet by mouth every 4 (four) hours as needed. 10 tablet 0  . prochlorperazine (COMPAZINE) 10 MG tablet Take 1 tablet (10 mg total) by mouth every 6 (six) hours as needed (Nausea or vomiting). (Patient not taking: Reported on 11/08/2019) 30 tablet 1  . TRIUMEQ 600-50-300 MG tablet TAKE 1 TABLET BY MOUTH DAILY (Patient taking differently: Take 1 tablet by mouth daily. ) 30 tablet 3  . valACYclovir (VALTREX) 1000 MG tablet Take 1 tablet (1,000 mg total) by mouth 3 (three) times daily. (Patient taking differently: Take 1,000 mg by mouth daily. ) 30 tablet 1  . zolpidem (AMBIEN) 10 MG tablet Take 1 tablet (10 mg total) by mouth at bedtime as needed. (Patient taking differently: Take 10 mg by mouth at bedtime as needed for sleep. ) 30 tablet  1   No current facility-administered medications for this visit.    SURGICAL HISTORY:  Past Surgical History:  Procedure Laterality Date  . CHOLECYSTECTOMY N/A 01/09/2015   Procedure: LAPAROSCOPIC CHOLECYSTECTOMY;  Surgeon: [name], MD;  Location: [facility];  Service: General;  Laterality: N/A;  . ECTOPIC PREGNANCY SURGERY    . ESOPHAGOGASTRODUODENOSCOPY (EGD) WITH PROPOFOL N/A 09/08/2019   Procedure: ESOPHAGOGASTRODUODENOSCOPY (EGD) WITH PROPOFOL;  Surgeon: Milus Banister, MD;  Location: [facility];  Service: Endoscopy;  Laterality: N/A;  . IR FLUORO GUIDE CV LINE LEFT  08/15/2019  . ORIF TIBIA PLATEAU Right 02/18/2017   Procedure: OPEN REDUCTION INTERNAL FIXATION (ORIF) RIGHT TIBIAL PLATEAU;  Surgeon: Leandrew Koyanagi, MD;  Location: [facility];  Service: Orthopedics;  Laterality: Right;  . SHOULDER SURGERY Right 12/2014  . UPPER ESOPHAGEAL ENDOSCOPIC ULTRASOUND (EUS) N/A 09/08/2019   Procedure: UPPER ESOPHAGEAL ENDOSCOPIC ULTRASOUND (EUS);  Surgeon: Milus Banister, MD;  Location: [facility];  Service: Endoscopy;  Laterality: N/A;    REVIEW OF SYSTEMS:   Review of Systems  Constitutional: Positive for appetite change and weight loss. negative for chills, fatigue, and fever.  HENT: Negative for mouth sores, nosebleeds, sore throat and trouble swallowing.   Eyes: Negative for eye problems and icterus.  Respiratory: Negative for cough, hemoptysis, shortness of breath and wheezing.   Cardiovascular: Negative for chest pain and leg swelling.  Gastrointestinal: Positive for rectal pain. Positive for constipation. Negative for abdominal pain,  nausea and vomiting.  Genitourinary:  Negative for bladder incontinence, dysuria, frequency and hematuria.   Musculoskeletal: Negative for back pain, gait problem, neck pain and neck stiffness.  Skin: Negative for itching and rash.  Neurological: Negative for dizziness, extremity weakness, gait problem, headaches, light-headedness and seizures.   Hematological: Negative for adenopathy. Does not bruise/bleed easily.  Psychiatric/Behavioral: Negative for confusion, depression and sleep disturbance. The patient is not nervous/anxious.      PHYSICAL EXAMINATION:  Blood pressure 115/87, pulse (!) 109, temperature 98.1 F (36.7 C), temperature source Tympanic, resp. rate 18, height 5' (1.524 m), weight 140 lb 12.8 oz (63.9 kg), SpO2 96 %.  ECOG PERFORMANCE STATUS: 1 - Symptomatic but completely ambulatory  Physical Exam  Constitutional: Oriented to person, place, and time and well-developed, well-nourished, and in no distress.  HENT:  Head: Normocephalic and atraumatic.  Mouth/Throat: Oropharynx is clear and moist. No oropharyngeal exudate.  Eyes: Conjunctivae are normal. Right eye exhibits no discharge. Left eye exhibits no discharge. No scleral icterus.  Neck: Normal range of motion. Neck supple.  Cardiovascular: Normal rate, regular rhythm, normal heart sounds and intact distal pulses.   Pulmonary/Chest: Effort normal and breath sounds normal. No respiratory distress. No wheezes. No rales.  Abdominal: Soft. Bowel sounds are normal. Exhibits no distension and no mass. There is no tenderness.  Musculoskeletal: Normal range of motion. Exhibits no edema.  Lymphadenopathy:    No cervical adenopathy.  Neurological: Alert and oriented to person, place, and time. Exhibits normal muscle tone. Gait normal. Coordination normal.  Skin: Skin is warm and dry. No rash noted. Not diaphoretic. No erythema. No pallor.  Psychiatric: Mood, memory and judgment normal.  Rectal: Non thrombosed hemorrhoid noted in the anterior anus. The 2 shallow bilateral medial ulcerations are improved compared to prior and non-tender. Mild clear rectal drainage.  No blood.    LABORATORY DATA: Lab Results  Component Value Date   WBC 6.6 12/30/2019   HGB 11.3 (L) 12/30/2019   HCT 33.8 (L) 12/30/2019   MCV 100.0 12/30/2019   PLT 195 12/30/2019      Chemistry       Component Value Date/Time   NA 137 12/30/2019 1031   NA 142 08/31/2013 1515   K 2.9 (LL) 12/30/2019 1031   K 3.4 (L) 08/31/2013 1515   CL 105 12/30/2019 1031   CL 104 07/07/2012 1512   CO2 20 (L) 12/30/2019 1031   CO2 27 08/31/2013 1515   BUN 9 12/30/2019 1031   BUN 14.7 08/31/2013 1515   CREATININE 0.89 12/30/2019 1031   CREATININE 0.87 08/08/2019 1535   CREATININE 0.8 08/31/2013 1515      Component Value Date/Time   CALCIUM 10.8 (H) 12/30/2019 1031   CALCIUM 9.6 08/31/2013 1515   ALKPHOS 92 12/30/2019 1031   ALKPHOS 99 08/31/2013 1515   AST 23 12/30/2019 1031   AST 25 08/31/2013 1515   ALT 6 12/30/2019 1031   ALT 9 08/31/2013 1515   BILITOT 0.4 12/30/2019 1031   BILITOT 0.67 08/31/2013 1515       RADIOGRAPHIC STUDIES:  NM PET Image Restag (PS) Skull Base To Thigh  Result Date: 01/05/2020 CLINICAL DATA:  Subsequent treatment strategy for anal carcinoma status post chemotherapy and radiation therapy. EXAM: NUCLEAR MEDICINE PET SKULL BASE TO THIGH TECHNIQUE: 7.2 mCi F-18 FDG was injected intravenously. Full-ring PET imaging was performed from the skull base to thigh after the radiotracer. CT data was obtained and used for attenuation correction and anatomic localization. Fasting blood glucose: 102 mg/dl COMPARISON:  08/04/2019 PET-CT. 11/02/2019 CT chest, abdomen and pelvis. FINDINGS: Mediastinal blood pool activity: SUV max 2.9 Liver activity: SUV max NA NECK: No hypermetabolic lymph nodes in the neck. Incidental CT findings: none CHEST: Hypermetabolic left axillary lymph nodes have decreased in size and metabolism. Representative 0.6 cm left axillary node with max SUV 6.0 (series 4/image 54), previously 0.9 cm with max SUV 9.1. No new enlarged or hypermetabolic axillary, mediastinal or hilar lymph nodes. Patchy consolidative bandlike airspace opacities throughout the mid to lower lungs bilaterally are mildly decreased in thickness and metabolism. Representative lingular  bandlike opacity with max SUV 6.6, previous max SUV 11.5. No new hypermetabolic pulmonary findings. Incidental CT findings: Coronary atherosclerosis. Atherosclerotic nonaneurysmal thoracic aorta. Mild centrilobular emphysema. No new significant pulmonary nodules. ABDOMEN/PELVIS: Intensely hypermetabolic anal mass with max SUV 19.3, previous max SUV 32.2, decreased. No new or residual enlarged or hypermetabolic lymph nodes in the abdomen or pelvis. Previously described hypermetabolic gastrohepatic ligament, perirectal, right inguinal and right iliac nodes have resolved. No abnormal hypermetabolic activity within the  liver, pancreas, adrenal glands, or spleen. Incidental CT findings: Mildly atherosclerotic nonaneurysmal abdominal aorta. Small coarse uterine calcifications presumably represent degenerated fibroids. SKELETON: No focal hypermetabolic activity to suggest skeletal metastasis. Widespread muscular activity throughout the neck, chest, abdomen and pelvis is presumably activity related, with no correlating discrete soft tissue masses on the noncontrast CT images. Incidental CT findings: none IMPRESSION: 1. Partial metabolic response. Persistent anal mass hypermetabolism, decreased. No residual hypermetabolic nodal metastases in the abdomen or pelvis. Left axillary hypermetabolic lymph nodes are decreased in size and metabolism. No new or progressive hypermetabolic metastatic disease. 2. Hypermetabolism associated with patchy consolidative airspace opacities in the mid to lower lungs bilaterally, decreased in extent and metabolism, favoring resolving inflammatory opacities. 3. Aortic Atherosclerosis (ICD10-I70.0) and Emphysema (ICD10-J43.9). Electronically Signed   By: Ilona Sorrel M.D.   On: 01/05/2020 14:11     ASSESSMENT/PLAN:  KAYLEY ZEIDERS is a 69 y.o. 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 Her biopsy results show Squamous Cell Carcinoma.This is consistent with anal cancer. She has HIV, which is a high risk for anal cancer. -HerPET from 08/04/19 showedhypermetabolicanal mass, hypermetabolicLNsin posterior perirectal space, right inguinal region, right iliac chain and gastrohepatic ligament suspicious for metastasis. Scan also showed hypermetabolic left axillary LNs, which is unusual metastatic pattern. Shedenies recent injury or injections in the left arm. -She underwent EUSfirst on 09/08/19 with Dr Ardis Hughs. Her Axillary LN was not found so no biopsy was done. -Althoughmetastatic disease is not ruled out at this point,Dr. Lisbeth Renshaw is able to cover thegastrohepatic lymph node in the radiation field.She underwent 6 weeks of concurrent ChemoRT withMitomycin and 5FU which is the standard therapyfordefinitive anal cancer therapy.She completed on 10/05/19.  -She has residual anal pain. Due to stomach irritation from ibuprofen use she went to ED. Her CT CAP there on 11/02/19 showed NED.  -Was referred to GI for anal scope and biopsy by surgeon.  -The patient saw Dr. Dema Severin from surgery on 01/05/20 who did not recommend surgery at this time if her imaging studies have evidence of metastatic disease -Had PET scan scheduled on 01/05/20 which showed a good response to treatment but not a complete response.  She still has a partial metabolic response in the anal mass.  No clear residual hypermetabolism was noted in the nodal metastases in the abdomen or pelvis.  The left axial hypermetabolic lymph nodes are decreased in size and metabolism.  The patient denies any recent vaccines or traumas to the left arm.  She denies any overlying skin infections. -Dr. Burr Medico discussed the results with the patient today in detail.  Dr. Burr Medico recommends a MRI of the pelvis to further  assess for residual disease/cause of rectal pain.  -We will also arrange for a ultrasound-guided core biopsy of one of the axillary lymph nodes to assess for nodal metastasis. -Dr. Burr Medico discussed that   pending the results of her studies, she may recommend further treatment with immunotherapy with nivolumab.  She discussed the different dosing options of IV every 2 or every 4 weeks. Discussed with her the adverse effect of the immunotherapy including but not limited to immunotherapy mediated skin rash, diarrhea, inflammation of the lung, kidney, liver, thyroid or other endocrine dysfunction. She was given an Customer service manager on nivolumab today.  -F/U in two weeks to discuss the results  of her imaging studies/biopsy and for a more detailed discussion about further treatment management.    2. Hep C, HIV -She underwentsuccessfulHep C treatment 2 years ago ([id number]) -Her HIV was contracted through sex. Her HIV is well controlled. She will continue to f/u ID Dr Graylon Good.Last CD4 count was 386 on 02/07/2019.  3. Comorbidities: HTN, Hyperparathyroidism, Osteoporosis -Continue medications and f/u with PCP.   4. Social Support  -She is divorced, lives alone and has 2 adult children who live in [place]  -She is getting bachelors degree in Criminal Justice through Online schooling, she plans to complete in 10/2019 but may postpone her final classes.  -She is currently working at [employer] as [employer] and works 8-9 hours every day. She notes her work is flexible to take time off if needed. -Social work met with patient today 01/06/20  5. Anal pain  secondary to chemoRT, completed now.  -She was oxycodone $RemoveBefor'5mg'QxEbxovpCwtg$  TID. She notes it makes her drowsy but does not really help her pain. She notes Tylenol does not help. She tried ibuprofen but took too many and caused stomach irritation and pain so she stopped.  -Given her pain is nerve related Dr. Burr Medico called in Gabapentin $RemoveBefor'300mg'ueAdlMprnDsh$  nightly so she  can use less oxycodone and possibly stop it. If causes too much drowsiness, will reduce dose. If tolerable she can use 1-2 tabs during the day if needed.  -She takes Cymbalta as prescribed.  -01/06/20 she is reporting taking 1-2 tablets of oxycodone a day.  -Dr. Burr Medico discussed consideration of a nerve block. The patient declined this at this time because she does not want to lose sensation in this area -Will refer to palliative care for recommendations regarding pain management. She will be discussed at the palliative care conference next week on 01/10/20  6. Anemia  -Secondary to chemoRT, completed now.    PLAN: -MRI of the pelvis -U/S core biopsy of the left axillary lymph nodes -Follow up visit in 2 weeks -Referral to palliative care -Will discuss patient case at palliative care conference next week -Refill zofran.    Orders Placed This Encounter  Procedures  . [place] CORE BIOPSY (LYMPH NODES)    Standing Status:   Future    Standing Expiration Date:   01/05/2021    Order Specific Question:   Lab orders requested (DO NOT place separate lab orders, these will be automatically ordered during procedure specimen collection):    Answer:   Surgical Pathology    Order Specific Question:   Reason for Exam (SYMPTOM  OR DIAGNOSIS REQUIRED)    Answer:   Please consider biopsy of the hypermetabolic axillary lymph node in left, completed treatment for anal cancer. Unsual location for nodal metastasis    Order Specific Question:   Preferred location?    Answer:   [facility]    Standing Status:   Future    Standing Expiration Date:   01/05/2021    Order Specific Question:   If indicated for the ordered procedure, I authorize the administration of contrast media per Radiology protocol    Answer:   Yes    Order Specific Question:   What is the patient's sedation requirement?    Answer:   No Sedation    Order Specific Question:   Does the patient have a pacemaker  or implanted devices?    Answer:   No    Order Specific Question:   Radiology Contrast Protocol -  do NOT remove file path    Answer:   [email or web address]    Order Specific Question:   Preferred imaging location?    Answer:   [facility] (table limit - 550 lbs)  . Amb Referral to Palliative Care    Referral Priority:   Routine    Referral Type:   Consultation    Referred to Provider:   Acquanetta Chain, DO    Number of Visits Requested:   [place], PA-C 01/06/20  I have seen the patient, examined her. I agree with the assessment and and plan and have edited the notes.   Ms Odwyer has persistent severe rectal pain.  I personally reviewed her PET scan images with patient.  She has not had good partial response, unfortunately still have significant residual disease in the anus/rectum, and hypermetabolic left axillary lymph node.  Her previous abdominal and pelvic adenopathy has resolved.  I have discussed her case with her surgeon Dr. Dema Severin, he does not think APR surgery will cure her cancer and does not recommend it. I recommend left axillary node biopsy to see if she has metastatic disease, and get pelvic MRI to further evaluate the primary tumor. If the above work conforms residual disease, I recommend her to start systemic therapy with Nivolumab.  We discussed pain management, including medicine and possible sacral nerve block.  Patient declined sacral nerve block due to it's side effect. Will present her case in palliative care conference next week, and refer her to palliative care clinic. I will see her back in 2 weeks.  Truitt Merle  01/06/2020

## 2020-01-06 ENCOUNTER — Inpatient Hospital Stay (HOSPITAL_BASED_OUTPATIENT_CLINIC_OR_DEPARTMENT_OTHER): Payer: Medicare HMO | Admitting: Physician Assistant

## 2020-01-06 ENCOUNTER — Inpatient Hospital Stay: Payer: Medicare HMO | Admitting: General Practice

## 2020-01-06 ENCOUNTER — Telehealth: Payer: Self-pay | Admitting: General Practice

## 2020-01-06 ENCOUNTER — Other Ambulatory Visit: Payer: Self-pay

## 2020-01-06 VITALS — BP 115/87 | HR 109 | Temp 98.1°F | Resp 18 | Ht 60.0 in | Wt 140.8 lb

## 2020-01-06 DIAGNOSIS — J439 Emphysema, unspecified: Secondary | ICD-10-CM | POA: Diagnosis not present

## 2020-01-06 DIAGNOSIS — C210 Malignant neoplasm of anus, unspecified: Secondary | ICD-10-CM

## 2020-01-06 DIAGNOSIS — R69 Illness, unspecified: Secondary | ICD-10-CM | POA: Diagnosis not present

## 2020-01-06 DIAGNOSIS — K6289 Other specified diseases of anus and rectum: Secondary | ICD-10-CM

## 2020-01-06 DIAGNOSIS — J479 Bronchiectasis, uncomplicated: Secondary | ICD-10-CM | POA: Diagnosis not present

## 2020-01-06 DIAGNOSIS — R634 Abnormal weight loss: Secondary | ICD-10-CM | POA: Diagnosis not present

## 2020-01-06 DIAGNOSIS — K5900 Constipation, unspecified: Secondary | ICD-10-CM | POA: Diagnosis not present

## 2020-01-06 DIAGNOSIS — K429 Umbilical hernia without obstruction or gangrene: Secondary | ICD-10-CM | POA: Diagnosis not present

## 2020-01-06 DIAGNOSIS — D123 Benign neoplasm of transverse colon: Secondary | ICD-10-CM | POA: Diagnosis not present

## 2020-01-06 DIAGNOSIS — R197 Diarrhea, unspecified: Secondary | ICD-10-CM | POA: Diagnosis not present

## 2020-01-06 MED ORDER — ONDANSETRON HCL 8 MG PO TABS: 8.0000 mg | ORAL_TABLET | Freq: Two times a day (BID) | ORAL | 2 refills | Status: DC | PRN

## 2020-01-06 NOTE — Progress Notes (Signed)
[facility] CSW Progress Notes  Met w patient in exam room - her initial concerns are no longer applicable. She does report difficulty w transportation "I have a car but I cannot drive and its hard to get someone else to drive."  Will send referral to [place] for rides to/from [facility].  Provided second [place] her to complete enrollment in [place] if desired.  She would like the COVID vaccination - if it is deemed OK w her medical providers.  Asked C Heilingotter PA to speak w patient about this and help get this scheduled if needed.  Edwyna Shell, LCSW Clinical Social Worker Phone:  [phone] Cell:  [phone]

## 2020-01-06 NOTE — Patient Instructions (Signed)
It is common for patients who are undergoing treatment and taking certain prescribed medications to experience side-effects with constipation.  If you experience constipation, please take stool softener such as Colace or Senna one tablet twice a day everyday to avoid constipation.  These medications are available over the counter.  Of course, if you have diarrhea, stop taking stool softeners.  Drinking plenty of fluid, eating fruits and vegetable, and being active also reduces the risk of constipation.   If despite taking stool softeners, and you still have no bowel movement for 2 days or more than your normal bowel habit frequency, please take one of the following over the counter laxatives:  MiraLax, Milk of Magnesia or Mag Citrate everyday and contact me immediately for further instructions.  The goal is to have at least one bowel movement every other day.   

## 2020-01-06 NOTE — Telephone Encounter (Signed)
[facility] CSW Progress Notes  Patient stopped in to [employer], stated she needed to see Education officer, museum.  Tried to call her, VM full, no answer.  CSW [place] asked to try again next week.  Edwyna Shell, LCSW Clinical Social Worker Phone:  [phone]

## 2020-01-07 ENCOUNTER — Encounter: Payer: Self-pay | Admitting: Physician Assistant

## 2020-01-09 ENCOUNTER — Other Ambulatory Visit: Payer: Self-pay | Admitting: Hematology

## 2020-01-09 ENCOUNTER — Encounter (HOSPITAL_COMMUNITY): Payer: Self-pay

## 2020-01-09 NOTE — Progress Notes (Unsigned)
Vanessa Beard Female, 69 y.o., [birth year] MRN:  [id number] Phone:  [phone] [name]) PCP:  Seward Carol, MD Coverage:  [place] [employer] Medicare Hmo/Ppo Next Appt With Radiology ([facility]MR 1) 01/17/2020 at 8:00 PM  FW: [place] Core Biopsy (lymph node) Received: Today Message Details  K[place] Reap Previous Messages  ----- Message -----  From: Arne Cleveland, MD  Sent: 01/09/2020 10:39 AM EDT  To: [name]  Subject: RE: [place] Core Biopsy (lymph node)          Ok   [place] core or FNA L ax LN  See PET Im54 Se 4   DDH    ----- Message -----  From: Garth Bigness D  Sent: 01/06/2020  4:27 PM EDT  To: [name]  Subject: [place] Core Biopsy (lymph node)            Procedure: [place] CORE BIOPSY (LYMPH NODES)   Reason: Anal cancer, Please consider biopsy of the hypermetabolic axillary lymph node in left, completed treatment for anal cancer. Unsual location for nodal metastasis   History: CT, NM PET in computer   Provider: Gracelyn Nurse   Provider Contact: [phone]

## 2020-01-10 ENCOUNTER — Inpatient Hospital Stay: Payer: Medicare HMO

## 2020-01-10 ENCOUNTER — Other Ambulatory Visit: Payer: Self-pay | Admitting: Physician Assistant

## 2020-01-10 ENCOUNTER — Telehealth: Payer: Self-pay | Admitting: Hematology

## 2020-01-10 NOTE — Telephone Encounter (Signed)
Scheduled per los. Called and spoke with patient. Confirmed appt 

## 2020-01-11 ENCOUNTER — Other Ambulatory Visit: Payer: Self-pay | Admitting: Physician Assistant

## 2020-01-13 ENCOUNTER — Telehealth: Payer: Self-pay | Admitting: Physician Assistant

## 2020-01-13 ENCOUNTER — Other Ambulatory Visit: Payer: Self-pay | Admitting: Internal Medicine

## 2020-01-13 ENCOUNTER — Other Ambulatory Visit: Payer: Self-pay | Admitting: Physician Assistant

## 2020-01-13 DIAGNOSIS — C210 Malignant neoplasm of anus, unspecified: Secondary | ICD-10-CM | POA: Diagnosis not present

## 2020-01-13 DIAGNOSIS — K6289 Other specified diseases of anus and rectum: Secondary | ICD-10-CM

## 2020-01-13 MED ORDER — BUDESONIDE 2 MG/ACT RE FOAM
RECTAL | 0 refills | Status: DC
Start: 2020-01-13 — End: 2020-03-11

## 2020-01-13 MED ORDER — SUCRALFATE 1 G PO TABS: ORAL_TABLET | ORAL | 0 refills | Status: DC

## 2020-01-13 NOTE — Telephone Encounter (Signed)
I called the patient to discuss the recommendations provided at the palliative care conference. She would be interested in receiving a prescription for budesonide rectal foam and sucralfate. I reviewed how to prepare the sucralfate with the patient. She expressed understanding. She also mentioned she was scheduled for her biopsy on 9/9. She states her ride is not available on that day and needs to be rescheduled. I gave her the number to IR scheduling to reschedule. She expressed understanding.

## 2020-01-16 ENCOUNTER — Other Ambulatory Visit: Payer: Self-pay | Admitting: Hematology

## 2020-01-16 ENCOUNTER — Telehealth: Payer: Self-pay

## 2020-01-16 DIAGNOSIS — K6289 Other specified diseases of anus and rectum: Secondary | ICD-10-CM

## 2020-01-16 DIAGNOSIS — Z7189 Other specified counseling: Secondary | ICD-10-CM | POA: Insufficient documentation

## 2020-01-16 DIAGNOSIS — C210 Malignant neoplasm of anus, unspecified: Secondary | ICD-10-CM

## 2020-01-16 MED ORDER — OXYCODONE HCL 5 MG PO TABS: 5.0000 mg | ORAL_TABLET | Freq: Four times a day (QID) | ORAL | 0 refills | Status: DC | PRN

## 2020-01-16 NOTE — Progress Notes (Signed)
DISCONTINUE ON PATHWAY REGIMEN - Anal Carcinoma     Chemotherapy concurrent with RT:     Mitomycin      Fluorouracil   **Always confirm dose/schedule in your pharmacy ordering system**  REASON: Disease Progression PRIOR TREATMENT: ANLOS01: Fluorouracil 1,000 mg/m2/day CIV D1-4, 29-32 + Mitomycin 10 mg/m2 D1, 29 + Concurrent Radiation Therapy TREATMENT RESPONSE: Partial Response (PR)  START ON PATHWAY REGIMEN - Anal Carcinoma     A cycle is every 21 days:     Pembrolizumab   **Always confirm dose/schedule in your pharmacy ordering system**  Patient Characteristics: Anal Canal Tumors, Distant Metastases / Local Recurrence - Unresectable, Second Line and Beyond Therapeutic Status: Distant Metastases Line of therapy: Second Line and Beyond  Intent of Therapy: Non-Curative / Palliative Intent, Discussed with Patient

## 2020-01-16 NOTE — Telephone Encounter (Signed)
Vanessa Beard left vm requesting refill of her pain medication.

## 2020-01-17 ENCOUNTER — Encounter (HOSPITAL_COMMUNITY): Payer: Self-pay

## 2020-01-17 ENCOUNTER — Ambulatory Visit (HOSPITAL_COMMUNITY): Payer: Medicare HMO

## 2020-01-18 ENCOUNTER — Telehealth: Payer: Self-pay | Admitting: Hematology

## 2020-01-18 NOTE — Telephone Encounter (Signed)
Scheduled appt per 8/30 sch msg - pt is aware of apt date and time   

## 2020-01-20 ENCOUNTER — Other Ambulatory Visit: Payer: Self-pay | Admitting: Radiology

## 2020-01-20 ENCOUNTER — Inpatient Hospital Stay: Payer: Medicare HMO | Admitting: Hematology

## 2020-01-20 NOTE — Progress Notes (Signed)
Pharmacist Chemotherapy Monitoring - Initial Assessment    Anticipated start date: 01/29/20   Regimen:  . Are orders appropriate based on the patient's diagnosis, regimen, and cycle? Yes . Does the plan date match the patient's scheduled date? Yes . Is the sequencing of drugs appropriate? Yes . Are the premedications appropriate for the patient's regimen? Yes . Prior Authorization for treatment is: Pending o If applicable, is the correct biosimilar selected based on the patient's insurance? not applicable  Organ Function and Labs: Marland Kitchen Are dose adjustments needed based on the patient's renal function, hepatic function, or hematologic function? No . Are appropriate labs ordered prior to the start of patient's treatment? Yes . Other organ system assessment, if indicated: N/A . The following baseline labs, if indicated, have been ordered: pembrolizumab: baseline TSH +/- T4  Dose Assessment: . Are the drug doses appropriate? Yes . Are the following correct: o Drug concentrations Yes o IV fluid compatible with drug Yes o Administration routes Yes o Timing of therapy Yes . If applicable, does the patient have documented access for treatment and/or plans for port-a-cath placement? not applicable . If applicable, have lifetime cumulative doses been properly documented and assessed? not applicable Lifetime Dose Tracking  No doses have been documented on this patient for the following tracked chemicals: Doxorubicin, Epirubicin, Idarubicin, Daunorubicin, Mitoxantrone, Bleomycin, Oxaliplatin, Carboplatin, Liposomal Doxorubicin  o   Toxicity Monitoring/Prevention: . The patient has the following take home antiemetics prescribed: N/A . The patient has the following take home medications prescribed: N/A . Medication allergies and previous infusion related reactions, if applicable, have been reviewed and addressed. Yes . The patient's current medication list has been assessed for drug-drug  interactions with their chemotherapy regimen. no significant drug-drug interactions were identified on review.  Order Review: . Are the treatment plan orders signed? Yes . Is the patient scheduled to see a provider prior to their treatment? Yes  I verify that I have reviewed each item in the above checklist and answered each question accordingly.  Willa Brocks K 01/20/2020 3:27 PM

## 2020-01-24 ENCOUNTER — Other Ambulatory Visit: Payer: Self-pay

## 2020-01-24 ENCOUNTER — Ambulatory Visit (HOSPITAL_COMMUNITY)
Admission: RE | Admit: 2020-01-24 | Discharge: 2020-01-24 | Disposition: A | Discharge status: Home / Self Care | Payer: Medicare HMO | Source: Ambulatory Visit | Attending: Physician Assistant | Admitting: Physician Assistant

## 2020-01-24 ENCOUNTER — Ambulatory Visit (HOSPITAL_COMMUNITY)
Admission: RE | Admit: 2020-01-24 | Discharge: 2020-01-24 | Disposition: A | Discharge status: Home / Self Care | Payer: Medicare HMO | Source: Ambulatory Visit | Attending: Hematology | Admitting: Hematology

## 2020-01-24 ENCOUNTER — Encounter (HOSPITAL_COMMUNITY): Payer: Self-pay

## 2020-01-24 DIAGNOSIS — C210 Malignant neoplasm of anus, unspecified: Secondary | ICD-10-CM | POA: Insufficient documentation

## 2020-01-24 DIAGNOSIS — I898 Other specified noninfective disorders of lymphatic vessels and lymph nodes: Secondary | ICD-10-CM | POA: Diagnosis not present

## 2020-01-24 DIAGNOSIS — B1920 Unspecified viral hepatitis C without hepatic coma: Secondary | ICD-10-CM | POA: Diagnosis not present

## 2020-01-24 DIAGNOSIS — E213 Hyperparathyroidism, unspecified: Secondary | ICD-10-CM | POA: Diagnosis not present

## 2020-01-24 DIAGNOSIS — R590 Localized enlarged lymph nodes: Secondary | ICD-10-CM | POA: Diagnosis not present

## 2020-01-24 DIAGNOSIS — J439 Emphysema, unspecified: Secondary | ICD-10-CM | POA: Diagnosis not present

## 2020-01-24 DIAGNOSIS — I700 Atherosclerosis of aorta: Secondary | ICD-10-CM | POA: Insufficient documentation

## 2020-01-24 DIAGNOSIS — Z79899 Other long term (current) drug therapy: Secondary | ICD-10-CM | POA: Diagnosis not present

## 2020-01-24 DIAGNOSIS — Z85048 Personal history of other malignant neoplasm of rectum, rectosigmoid junction, and anus: Secondary | ICD-10-CM | POA: Diagnosis not present

## 2020-01-24 DIAGNOSIS — Z21 Asymptomatic human immunodeficiency virus [HIV] infection status: Secondary | ICD-10-CM | POA: Insufficient documentation

## 2020-01-24 DIAGNOSIS — R69 Illness, unspecified: Secondary | ICD-10-CM | POA: Diagnosis not present

## 2020-01-24 LAB — CBC WITH DIFFERENTIAL/PLATELET
Abs Immature Granulocytes: 0.03 10*3/uL (ref 0.00–0.07)
Basophils Absolute: 0 10*3/uL (ref 0.0–0.1)
Basophils Relative: 0 %
Eosinophils Absolute: 0 10*3/uL (ref 0.0–0.5)
Eosinophils Relative: 1 %
HCT: 35.1 % — ABNORMAL LOW (ref 36.0–46.0)
Hemoglobin: 11.4 g/dL — ABNORMAL LOW (ref 12.0–15.0)
Immature Granulocytes: 0 %
Lymphocytes Relative: 15 %
Lymphs Abs: 1 10*3/uL (ref 0.7–4.0)
MCH: 32.3 pg (ref 26.0–34.0)
MCHC: 32.5 g/dL (ref 30.0–36.0)
MCV: 99.4 fL (ref 80.0–100.0)
Monocytes Absolute: 0.4 10*3/uL (ref 0.1–1.0)
Monocytes Relative: 6 %
Neutro Abs: 5.3 10*3/uL (ref 1.7–7.7)
Neutrophils Relative %: 78 %
Platelets: 197 10*3/uL (ref 150–400)
RBC: 3.53 MIL/uL — ABNORMAL LOW (ref 3.87–5.11)
RDW: 13.2 % (ref 11.5–15.5)
WBC: 6.8 10*3/uL (ref 4.0–10.5)
nRBC: 0 % (ref 0.0–0.2)

## 2020-01-24 LAB — BASIC METABOLIC PANEL
Anion gap: 11 (ref 5–15)
BUN: 12 mg/dL (ref 8–23)
CO2: 23 mmol/L (ref 22–32)
Calcium: 9.5 mg/dL (ref 8.9–10.3)
Chloride: 99 mmol/L (ref 98–111)
Creatinine, Ser: 0.93 mg/dL (ref 0.44–1.00)
GFR calc Af Amer: >60 mL/min (ref >60)
GFR calc non Af Amer: >60 mL/min (ref >60)
Glucose, Bld: 134 mg/dL — ABNORMAL HIGH (ref 70–99)
Potassium: 3.1 mmol/L — ABNORMAL LOW (ref 3.5–5.1)
Sodium: 133 mmol/L — ABNORMAL LOW (ref 135–145)

## 2020-01-24 LAB — PROTIME-INR
INR: 1.1 (ref 0.8–1.2)
Prothrombin Time: 13.9 seconds (ref 11.4–15.2)

## 2020-01-24 MED ORDER — FENTANYL CITRATE (PF) 100 MCG/2ML IJ SOLN
INTRAMUSCULAR | Status: AC
  Filled 2020-01-24: qty 2

## 2020-01-24 MED ORDER — FENTANYL CITRATE (PF) 100 MCG/2ML IJ SOLN
INTRAMUSCULAR | Status: AC | PRN
  Administered 2020-01-24: 50 ug via INTRAVENOUS

## 2020-01-24 MED ORDER — MIDAZOLAM HCL 2 MG/2ML IJ SOLN
INTRAMUSCULAR | Status: AC
  Filled 2020-01-24: qty 2

## 2020-01-24 MED ORDER — LIDOCAINE HCL (PF) 1 % IJ SOLN
INTRAMUSCULAR | Status: AC | PRN
  Administered 2020-01-24: 5 mL

## 2020-01-24 MED ORDER — SODIUM CHLORIDE 0.9 % IV SOLN: INTRAVENOUS | Status: DC

## 2020-01-24 MED ORDER — MIDAZOLAM HCL 2 MG/2ML IJ SOLN
INTRAMUSCULAR | Status: AC | PRN
  Administered 2020-01-24: 1 mg via INTRAVENOUS

## 2020-01-24 MED ORDER — LIDOCAINE HCL 1 % IJ SOLN
INTRAMUSCULAR | Status: AC
  Filled 2020-01-24: qty 20

## 2020-01-24 NOTE — H&P (Addendum)
Referring Physician(s): Heilingoetter,Cassandra L/Feng,Y  Supervising Physician: Sandi Mariscal  Patient Status:  [facility] OP  Chief Complaint:  "I'm having a biopsy"  Subjective: Patient familiar to IR service from PICC placement on 08/15/2019.  She has a history of anal cancer diagnosed in March of this year ,status post chemoradiation.  PET scan performed on 01/05/2020 revealed: 1. Partial metabolic response. Persistent anal mass hypermetabolism, decreased. No residual hypermetabolic nodal metastases in the abdomen or pelvis. Left axillary hypermetabolic lymph nodes are decreased in size and metabolism. No new or progressive hypermetabolic metastatic disease. 2. Hypermetabolism associated with patchy consolidative airspace opacities in the mid to lower lungs bilaterally, decreased in extent and metabolism, favoring resolving inflammatory opacities. 3. Aortic Atherosclerosis (ICD10-I70.0) and Emphysema   She presents today for image guided left axillary lymph node biopsy for further evaluation.  She currently denies fever, headache, chest pain, worsening dyspnea, cough, abdominal/back pain, nausea, vomiting or bleeding. Additional PMH as below.   Past Medical History:  Diagnosis Date  . Allergic rhinitis   . Biliary colic   . Cancer ([place])   . Gall bladder disease \  . Hepatitis C   . Herpes   . HIV infection ([place])   . Hyperparathyroidism ([place])   . Hypertension   . Insomnia   . Osteoporosis   . Pneumonia    2010   Past Surgical History:  Procedure Laterality Date  . CHOLECYSTECTOMY N/A 01/09/2015   Procedure: LAPAROSCOPIC CHOLECYSTECTOMY;  Surgeon: [name], MD;  Location: [facility];  Service: General;  Laterality: N/A;  . ECTOPIC PREGNANCY SURGERY    . ESOPHAGOGASTRODUODENOSCOPY (EGD) WITH PROPOFOL N/A 09/08/2019   Procedure: ESOPHAGOGASTRODUODENOSCOPY (EGD) WITH PROPOFOL;  Surgeon: Milus Banister, MD;  Location: [facility];  Service: Endoscopy;  Laterality: N/A;  .  IR FLUORO GUIDE CV LINE LEFT  08/15/2019  . ORIF TIBIA PLATEAU Right 02/18/2017   Procedure: OPEN REDUCTION INTERNAL FIXATION (ORIF) RIGHT TIBIAL PLATEAU;  Surgeon: Leandrew Koyanagi, MD;  Location: [facility];  Service: Orthopedics;  Laterality: Right;  . SHOULDER SURGERY Right 12/2014  . UPPER ESOPHAGEAL ENDOSCOPIC ULTRASOUND (EUS) N/A 09/08/2019   Procedure: UPPER ESOPHAGEAL ENDOSCOPIC ULTRASOUND (EUS);  Surgeon: Milus Banister, MD;  Location: [facility];  Service: Endoscopy;  Laterality: N/A;      Allergies: Dapsone, Retrovir [zidovudine], and Sulfamethoxazole-trimethoprim  Medications: Prior to Admission medications   Medication Sig Start Date End Date Taking? Authorizing Provider  Budesonide 2 MG/ACT FOAM Initial: 2 mg (one metered dose) twice daily (morning and evening) for 2 weeks followed by maintenance: 2 mg (one metered dose) once daily (evening) for 4 weeks. 01/13/20  Yes Heilingoetter, Cassandra L, PA-C  diphenoxylate-atropine (LOMOTIL) 2.5-0.025 MG tablet Take 1-2 tablets by mouth 4 (four) times daily as needed for diarrhea or loose stools. 12/06/19  Yes Tanner, Lyndon Code., PA-C  DULoxetine (CYMBALTA) 20 MG capsule Take 1 capsule (20 mg total) by mouth daily. 12/16/19  Yes Heilingoetter, Cassandra L, PA-C  hydrocortisone (ANUSOL-HC) 25 MG suppository Place 1 suppository (25 mg total) rectally 2 (two) times daily. 12/26/19  Yes Hayden Pedro, PA-C  hydrocortisone 1 % ointment Apply 1 application topically daily as needed for hemorrhoids. 12/06/19  Yes [provider]  oxyCODONE (OXY IR/ROXICODONE) 5 MG immediate release tablet Take 1 tablet (5 mg total) by mouth every 6 (six) hours as needed for severe pain. 01/16/20  Yes Truitt Merle, MD  oxyCODONE-acetaminophen (PERCOCET) 5-325 MG tablet Take 1 tablet by mouth every 4 (four) hours as  needed. 12/24/19  Yes Daleen Bo, MD  sucralfate (CARAFATE) 1 g tablet Please take 2 tablets (2 g total) and mix in 4.5 ml of water and place in enema  applicator and use twice daily 01/13/20  Yes Heilingoetter, Cassandra L, PA-C  valACYclovir (VALTREX) 1000 MG tablet Take 1 tablet (1,000 mg total) by mouth 3 (three) times daily. Patient taking differently: Take 1,000 mg by mouth daily.  11/08/19  Yes Carlyle Basques, MD  zolpidem (AMBIEN) 10 MG tablet Take 1 tablet (10 mg total) by mouth at bedtime as needed. Patient taking differently: Take 10 mg by mouth at bedtime as needed for sleep.  07/01/17  Yes Carlyle Basques, MD  AMBULATORY NON FORMULARY MEDICATION Nitroglycerine ointment 0.125 %  Apply a pea sized amount internally four times daily. Patient not taking: Reported on 11/08/2019 08/01/19   Levin Erp, PA  amLODipine (NORVASC) 10 MG tablet Take 1 tablet (10 mg total) by mouth daily. Patient not taking: Reported on 11/08/2019 08/23/19   Carlyle Basques, MD  lidocaine (XYLOCAINE) 2 % solution Apply topically to anus as needed for pain relief 12/06/19   Tanner, Lucianne Lei E., PA-C  TRIUMEQ 027-25-366 MG tablet TAKE 1 TABLET BY MOUTH DAILY Patient taking differently: Take 1 tablet by mouth daily.  12/08/19   Carlyle Basques, MD  prochlorperazine (COMPAZINE) 10 MG tablet Take 1 tablet (10 mg total) by mouth every 6 (six) hours as needed (Nausea or vomiting). Patient not taking: Reported on 11/08/2019 07/26/19 01/16/20  Truitt Merle, MD     Vital Signs: BP 119/88   Pulse (!) 101   Temp 98.2 F (36.8 C) (Oral)   Resp 18   SpO2 95%   Physical Exam awake, alert.  Chest clear to auscultation bilaterally.  Heart with slightly tachycardic but regular rhythm.  Abdomen soft, positive bowel sounds, nontender.  No significant lower extremity edema.  Imaging: No results found.  Labs:  CBC: Recent Labs    12/06/19 1345 12/16/19 1316 12/30/19 1031 01/24/20 1136  WBC 3.6* 5.0 6.6 6.8  HGB 8.8* 10.5* 11.3* 11.4*  HCT 28.1* 31.8* 33.8* 35.1*  PLT 121* 182 195 197    COAGS: Recent Labs    01/24/20 1136  INR 1.1    BMP: Recent Labs     11/02/19 0030 11/02/19 0030 11/02/19 0149 11/14/19 1156 12/16/19 1316 12/30/19 1031  NA 137   < > 141 142 139 137  K 3.6   < > 3.6 3.1* 3.6 2.9*  CL 108   < > 106 108 105 105  CO2 22  --   --  21* 23 20*  GLUCOSE 96   < > 89 97 134* 129*  BUN 12   < > 11 14 10 9   CALCIUM 9.1  --   --  9.3 10.0 10.8*  CREATININE 0.78   < > 0.80 0.92 0.90 0.89  GFRNONAA >60  --   --  >60 >60 >60  GFRAA >60  --   --  >60 >60 >60   < > = values in this interval not displayed.    LIVER FUNCTION TESTS: Recent Labs    11/02/19 0030 11/14/19 1156 12/16/19 1316 12/30/19 1031  BILITOT 0.8 0.5 0.5 0.4  AST 26 25 25 23   ALT 12 12 7 6   ALKPHOS 76 86 94 92  PROT 8.4* 8.2* 8.3* 8.3*  ALBUMIN 3.5 3.1* 2.9* 2.9*    Assessment and Plan: Pt with history of HIV, hep C, HTN and anal  cancer diagnosed in March of this year ,status post chemoradiation.  PET scan performed on 01/05/2020 revealed: 1. Partial metabolic response. Persistent anal mass hypermetabolism, decreased. No residual hypermetabolic nodal metastases in the abdomen or pelvis. Left axillary hypermetabolic lymph nodes are decreased in size and metabolism. No new or progressive hypermetabolic metastatic disease. 2. Hypermetabolism associated with patchy consolidative airspace opacities in the mid to lower lungs bilaterally, decreased in extent and metabolism, favoring resolving inflammatory opacities. 3. Aortic Atherosclerosis (ICD10-I70.0) and Emphysema   She presents today for image guided left axillary lymph node biopsy for further evaluation.Risks and benefits of procedure was discussed with the patient  including, but not limited to bleeding, infection, damage to adjacent structures or low yield requiring additional tests.  All of the questions were answered and there is agreement to proceed.  Consent signed and in chart.     Electronically Signed: D. Rowe Robert, PA-C 01/24/2020, 11:56 AM   I spent a total of 25 minutes at the  the patient's bedside AND on the patient's hospital floor or unit, greater than 50% of which was counseling/coordinating care for image guided left axillary lymph node biopsy

## 2020-01-24 NOTE — Procedures (Signed)
Pre Procedure Dx: hypermetabolic left axillary lymph node Post Procedural Dx: Same  Technically successful US guided biopsy of left axillary lymph node.  EBL: None  No immediate complications.   Ronny Bacon, MD Pager #: [phone]

## 2020-01-24 NOTE — Discharge Instructions (Signed)
Needle Biopsy, Care After These instructions tell you how to care for yourself after your procedure. Your doctor may also give you more specific instructions. Call your doctor if you have any problems or questions. What can I expect after the procedure? After the procedure, it is common to have:  Soreness.  Bruising.  Mild pain. Follow these instructions at home:   Return to your normal activities as told by your doctor. Ask your doctor what activities are safe for you.  Take over-the-counter and prescription medicines only as told by your doctor.  Wash your hands with soap and water before you change your bandage (dressing). If you cannot use soap and water, use hand sanitizer. ? You may remove your band aid near your left arm pit tomorrow around 2 PM. You may shower after that time. No baths, hot tubs, or swimming pools.  Check your puncture site every day for signs of infection. Watch for: ? Redness, swelling, or pain. ? Fluid or blood. ? Pus or a bad smell. ? Warmth.  Do not take baths, swim, or use a hot tub until your doctor approves. Ask your doctor if you may take showers. You may only be allowed to take sponge baths.  Keep all follow-up visits as told by your doctor. This is important. Contact a doctor if you have:  A fever.  Redness, swelling, or pain at the puncture site, and it lasts longer than a few days.  Fluid, blood, or pus coming from the puncture site.  Warmth coming from the puncture site. Get help right away if:  You have a lot of bleeding from the puncture site. Summary  After the procedure, it is common to have soreness, bruising, or mild pain at the puncture site.  Check your puncture site every day for signs of infection, such as redness, swelling, or pain.  Get help right away if you have severe bleeding from your puncture site. This information is not intended to replace advice given to you by your health care provider. Make sure you discuss  any questions you have with your health care provider. Document Revised: 05/18/2017 Document Reviewed: 05/18/2017 Elsevier Patient Education  [employer].     Moderate Conscious Sedation, Adult, Care After These instructions provide you with information about caring for yourself after your procedure. Your health care provider may also give you more specific instructions. Your treatment has been planned according to current medical practices, but problems sometimes occur. Call your health care provider if you have any problems or questions after your procedure. What can I expect after the procedure? After your procedure, it is common:  To feel sleepy for several hours.  To feel clumsy and have poor balance for several hours.  To have poor judgment for several hours.  To vomit if you eat too soon. Follow these instructions at home: For at least 24 hours after the procedure:   Do not: ? Participate in activities where you could fall or become injured. ? Drive. ? Use heavy machinery. ? Drink alcohol. ? Take sleeping pills or medicines that cause drowsiness. ? Make important decisions or sign legal documents. ? Take care of children on your own.  Rest. Eating and drinking  Follow the diet recommended by your health care provider.  If you vomit: ? Drink water, juice, or soup when you can drink without vomiting. ? Make sure you have little or no nausea before eating solid foods. General instructions  Have a responsible adult stay with  you until you are awake and alert.  Take over-the-counter and prescription medicines only as told by your health care provider.  If you smoke, do not smoke without supervision.  Keep all follow-up visits as told by your health care provider. This is important. Contact a health care provider if:  You keep feeling nauseous or you keep vomiting.  You feel light-headed.  You develop a rash.  You have a fever. Get help right away  if:  You have trouble breathing. This information is not intended to replace advice given to you by your health care provider. Make sure you discuss any questions you have with your health care provider. Document Revised: 04/17/2017 Document Reviewed: 08/25/2015 Elsevier Patient Education  2020 [employer].

## 2020-01-24 NOTE — Progress Notes (Signed)
Patient is to be discharged at 3 PM; however, her ride cannot be here until 4 PM.

## 2020-01-25 ENCOUNTER — Other Ambulatory Visit: Payer: Self-pay | Admitting: Hematology

## 2020-01-25 ENCOUNTER — Telehealth: Payer: Self-pay

## 2020-01-25 DIAGNOSIS — K6289 Other specified diseases of anus and rectum: Secondary | ICD-10-CM

## 2020-01-25 DIAGNOSIS — C210 Malignant neoplasm of anus, unspecified: Secondary | ICD-10-CM

## 2020-01-25 MED ORDER — OXYCODONE HCL 5 MG PO TABS: 5.0000 mg | ORAL_TABLET | Freq: Four times a day (QID) | ORAL | 0 refills | Status: DC | PRN

## 2020-01-25 NOTE — Progress Notes (Signed)
[place]   [phone] [phone]   Clinic Follow up Note   Patient Care Team: Seward Carol, MD as PCP - General (Internal Medicine) Carlyle Basques, MD as PCP - Infectious Diseases (Infectious Diseases) Binnie Rail, DC as Referring Physician (Chiropractic Medicine) Jonnie Finner, RN as Oncology Nurse Navigator  Date of Service:  01/27/2020  CHIEF COMPLAINT: F/u ofanal cancer  SUMMARY OF ONCOLOGIC HISTORY: Oncology History Overview Note  Cancer Staging Anal cancer ([place]) Staging form: Anus, AJCC 8th Edition - Clinical: Stage IV (cT2, cN0, cM1) - Signed by Truitt Merle, MD on 08/12/2019    Anal cancer ([place])  07/19/2019 Procedure   Colonoscopy by Dr Havery Moros 07/19/19  IMPRESSION - Ulcer noted at the anal canal in posterior midline canal that extends into the distal rectum on digital rectal exam, nodular and somewhat hard to palpation. Endoscopic images show nodular tissue in the area, concerning for malignant ulcer. - The examined portion of the ileum was normal. - One 3 mm polyp at the hepatic flexure, removed with a cold snare. Resected and retrieved. - One 5 mm polyp in the transverse colon, removed with a cold snare. Resected and retrieved. - Diverticulosis in the ascending colon. - The examination was otherwise normal.   07/19/2019 Initial Biopsy   Diagnosis 07/19/19 1. Transverse Colon Polyp, hepatic flexure (2) - TUBULAR ADENOMA (1 OF 3 FRAGMENTS) - BENIGN COLONIC MUCOSA (2 OF 3 FRAGMENTS) - NO HIGH GRADE DYSPLASIA OR MALIGNANCY IDENTIFIED 2. Rectum, biopsy, distal rectal anal canal - SQUAMOUS CELL CARCINOMA - SEE COMMENT   07/26/2019 Initial Diagnosis   Anal cancer (Vanessa Beard)   07/26/2019 Cancer Staging   Staging form: Anus, AJCC 8th Edition - Clinical: Stage IV (cT2, cN1c, cM1) - Signed by Truitt Merle, MD on 08/12/2019   08/04/2019 PET scan   IMPRESSION: Hypermetabolic anal soft tissue mass, consistent with known primary anal  carcinoma.   Sub-cm hypermetabolic lymph nodes in posterior perirectal space, right inguinal region, right iliac chain, and gastrohepatic ligament, suspicious for metastatic disease.   Small hypermetabolic left axillary lymph nodes. This would be unusual location for metastatic anal carcinoma. Recommend clinical correlation for possibility the patient has had recent COVID vaccination in the left arm, which could explain this finding.   Multifocal airspace disease in both lower lungs with marked hypermetabolic activity. This favors infectious or inflammatory etiology, and is not typical for pulmonary metastases. Short-term follow-up by chest CT is recommended.   08/15/2019 - 10/05/2019 Radiation Therapy   Concurrent ChemoRT with Dr. Lisbeth Renshaw starting 08/15/19-10/05/19   08/15/2019 - 09/12/2019 Chemotherapy   Concurrent ChemoRT with Mitomycin and 5FU on week 1 and 5 starting 08/15/19 with week 5 dose on 09/12/19.    11/02/2019 Imaging   CT CAP w contrast  IMPRESSION: No acute findings in the chest, abdomen or pelvis.   Biapical and bibasilar scarring. Bronchiectasis and paraseptal emphysema in the lung bases.   Tortuous aorta.   Small umbilical hernia containing fat.   01/27/2020 -  Chemotherapy   The patient had pembrolizumab (KEYTRUDA) 200 mg in sodium chloride 0.9 % 50 mL chemo infusion, 200 mg, Intravenous, Once, 0 of 6 cycles  for chemotherapy treatment.       CURRENT THERAPY:  Keytruda every 3 weeks starting 01/27/20  INTERVAL HISTORY:  Vanessa Beard is here for a follow up. She presents to the clinic alone.  She has persistent rectal pain, especially when she stands for well, she cannot sit still due to  the rectal pain.  She is taking oxycodone 10 mg every 6 hours, pain is moderately controlled.  She also has slow urination, no dysuria or urinary frequency.  She lives alone, still able to function well at home.  Her daughter, son and granddaughter visit and help her at home  also.  Her appetite is normal, eating well, weight is stable, she denies any fever or other new.  Review of system otherwise negative.  MEDICAL HISTORY:  Past Medical History:  Diagnosis Date  . Allergic rhinitis   . Biliary colic   . Cancer (Vanessa Beard)   . Gall bladder disease \  . Hepatitis C   . Herpes   . HIV infection (Vanessa Beard)   . Hyperparathyroidism (Vanessa Beard)   . Hypertension   . Insomnia   . Osteoporosis   . Pneumonia    2010    SURGICAL HISTORY: Past Surgical History:  Procedure Laterality Date  . CHOLECYSTECTOMY N/A 01/09/2015   Procedure: LAPAROSCOPIC CHOLECYSTECTOMY;  Surgeon: [name], MD;  Location: [facility];  Service: General;  Laterality: N/A;  . ECTOPIC PREGNANCY SURGERY    . ESOPHAGOGASTRODUODENOSCOPY (EGD) WITH PROPOFOL N/A 09/08/2019   Procedure: ESOPHAGOGASTRODUODENOSCOPY (EGD) WITH PROPOFOL;  Surgeon: Milus Banister, MD;  Location: [facility];  Service: Endoscopy;  Laterality: N/A;  . IR FLUORO GUIDE CV LINE LEFT  08/15/2019  . ORIF TIBIA PLATEAU Right 02/18/2017   Procedure: OPEN REDUCTION INTERNAL FIXATION (ORIF) RIGHT TIBIAL PLATEAU;  Surgeon: Leandrew Koyanagi, MD;  Location: [facility];  Service: Orthopedics;  Laterality: Right;  . SHOULDER SURGERY Right 12/2014  . UPPER ESOPHAGEAL ENDOSCOPIC ULTRASOUND (EUS) N/A 09/08/2019   Procedure: UPPER ESOPHAGEAL ENDOSCOPIC ULTRASOUND (EUS);  Surgeon: Milus Banister, MD;  Location: [facility];  Service: Endoscopy;  Laterality: N/A;    I have reviewed the social history and family history with the patient and they are unchanged from previous note.  ALLERGIES:  is allergic to dapsone, retrovir [zidovudine], and sulfamethoxazole-trimethoprim.  MEDICATIONS:  Current Outpatient Medications  Medication Sig Dispense Refill  . AMBULATORY NON FORMULARY MEDICATION Nitroglycerine ointment 0.125 %  Apply a pea sized amount internally four times daily. (Patient not taking: Reported on 11/08/2019) 30 g 1  . amLODipine (NORVASC) 10 MG  tablet Take 1 tablet (10 mg total) by mouth daily. (Patient not taking: Reported on 11/08/2019) 90 tablet 3  . Budesonide 2 MG/ACT FOAM Initial: 2 mg (one metered dose) twice daily (morning and evening) for 2 weeks followed by maintenance: 2 mg (one metered dose) once daily (evening) for 4 weeks. 33.4 g 0  . diphenoxylate-atropine (LOMOTIL) 2.5-0.025 MG tablet Take 1-2 tablets by mouth 4 (four) times daily as needed for diarrhea or loose stools. 30 tablet 1  . DULoxetine (CYMBALTA) 20 MG capsule Take 1 capsule (20 mg total) by mouth daily. 30 capsule 2  . hydrocortisone (ANUSOL-HC) 25 MG suppository Place 1 suppository (25 mg total) rectally 2 (two) times daily. 24 suppository 1  . hydrocortisone 1 % ointment Apply 1 application topically daily as needed for hemorrhoids.    . lidocaine (XYLOCAINE) 2 % solution Apply topically to anus as needed for pain relief 100 mL 1  . oxyCODONE (OXY IR/ROXICODONE) 5 MG immediate release tablet Take 1-2 tablets (5-10 mg total) by mouth every 6 (six) hours as needed for severe pain. 90 tablet 0  . oxyCODONE-acetaminophen (PERCOCET) 5-325 MG tablet Take 1 tablet by mouth every 4 (four) hours as needed. 10 tablet 0  . sucralfate (CARAFATE) 1 g tablet  Please take 2 tablets (2 g total) and mix in 4.5 ml of water and place in enema applicator and use twice daily 60 tablet 0  . TRIUMEQ 600-50-300 MG tablet TAKE 1 TABLET BY MOUTH DAILY (Patient taking differently: Take 1 tablet by mouth daily. ) 30 tablet 3  . valACYclovir (VALTREX) 1000 MG tablet Take 1 tablet (1,000 mg total) by mouth 3 (three) times daily. (Patient taking differently: Take 1,000 mg by mouth daily. ) 30 tablet 1  . zolpidem (AMBIEN) 10 MG tablet Take 1 tablet (10 mg total) by mouth at bedtime as needed. (Patient taking differently: Take 10 mg by mouth at bedtime as needed for sleep. ) 30 tablet 1   No current facility-administered medications for this visit.    PHYSICAL EXAMINATION: ECOG PERFORMANCE  STATUS: 2 - Symptomatic, <50% confined to bed  Vitals:   01/27/20 0757  BP: 117/66  Pulse: (!) 110  Resp: 18  Temp: 98.7 F (37.1 C)  SpO2: 96%   Filed Weights   01/27/20 0757  Weight: 135 lb 11.2 oz (61.6 kg)    GENERAL:alert, no distress and comfortable SKIN: skin color, texture, turgor are normal, no rashes or significant lesions EYES: normal, Conjunctiva are pink and non-injected, sclera clear NECK: supple, thyroid normal size, non-tender, without nodularity LYMPH:  no palpable lymphadenopathy in the cervical, axillary  LUNGS: clear to auscultation and percussion with normal breathing effort HEART: regular rate & rhythm and no murmurs and no lower extremity edema ABDOMEN:abdomen soft, non-tender and normal bowel sounds Musculoskeletal:no cyanosis of digits and no clubbing  NEURO: alert & oriented x 3 with fluent speech, no focal motor/sensory deficits  LABORATORY DATA:  I have reviewed the data as listed CBC Latest Ref Rng & Units 01/27/2020 01/24/2020 12/30/2019  WBC 4.0 - 10.5 K/uL 7.3 6.8 6.6  Hemoglobin 12.0 - 15.0 g/dL 10.8(L) 11.4(L) 11.3(L)  Hematocrit 36 - 46 % 33.0(L) 35.1(L) 33.8(L)  Platelets 150 - 400 K/uL 205 197 195     CMP Latest Ref Rng & Units 01/27/2020 01/24/2020 12/30/2019  Glucose 70 - 99 mg/dL 127(H) 134(H) 129(H)  BUN 8 - 23 mg/dL 7(L) 12 9  Creatinine 0.44 - 1.00 mg/dL 0.81 0.93 0.89  Sodium 135 - 145 mmol/L 134(L) 133(L) 137  Potassium 3.5 - 5.1 mmol/L 3.5 3.1(L) 2.9(LL)  Chloride 98 - 111 mmol/L 102 99 105  CO2 22 - 32 mmol/L 20(L) 23 20(L)  Calcium 8.9 - 10.3 mg/dL 9.5 9.5 10.8(H)  Total Protein 6.5 - 8.1 g/dL 7.7 - 8.3(H)  Total Bilirubin 0.3 - 1.2 mg/dL 0.7 - 0.4  Alkaline Phos 38 - 126 U/L 90 - 92  AST 15 - 41 U/L 19 - 23  ALT 0 - 44 U/L <6 - 6      RADIOGRAPHIC STUDIES: I have personally reviewed the radiological images as listed and agreed with the findings in the report. No results found.   ASSESSMENT & PLAN:  Vanessa Beard  is a 69 y.o. 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 Her biopsy results show Squamous Cell Carcinoma.This is consistent with anal cancer. She has HIV, which is a high risk for anal cancer. -HerPET from 08/04/19 showedhypermetabolicanal mass, hypermetabolicLNsin posterior perirectal space, right inguinal region, right iliac chain and gastrohepatic ligament suspicious for metastasis. Scan also showed  hypermetabolic left axillary LNs, which is unusual metastatic pattern. Shedenies recent injury or injections in the left arm. -She completed concurrent chemoradiation -Posttreatment PET scan showed residual hypermetabolic primary tumor, and hypermetabolic left axillary lymph node, no other metastasis on the PET scan. -I discussed her recent left axillary lymph node biopsy, which was benign. -she was seen by Dr. Dema Severin lately, surgery was offered, patient declined. -I discussed that surgery is probably the only option to cure her cancer, patient still declined surgery due to the concern of colostomy bag. -I recommend systemic therapy with Keytruda 260m every 3 weeks.  And side effects discussed with patient, especially autoimmune related pneumonitis, colitis, thyroid dysfunction, etc. she voiced good understanding and agrees to proceed -The goal of therapy is predicated.  2. Hep C, HIV -She underwentsuccessfulHep C treatment 2 years ago (([id number] -Her HIV was contracted through sex. Her HIV is well controlled. She will continue to f/u ID Dr SGraylon GoodLast CD4 count was 386 on 02/07/2019.  3. Comorbidities: HTN, Hyperparathyroidism, Osteoporosis -Continue medications and f/u with PCP.   4. Social Support  -She is divorced, lives alone and has 2 adult  children who live in [place] -She is getting bachelors degree in Criminal Justice through Online schooling, she plans to complete in 10/2019 but may postpone her final classes.  -She is currently working at [employer] works 8-9 hours every day. She notes her work is flexible to take time off if needed. -Social work met with patient today 01/06/20 -She gets transportation assistance from our cancer center  5. Anal pain  -It has been quite severe lately -She is on oxycodone 10 mg 4 times a day, and Cymbalta 20 mg daily -We discussed option of sacral nerve blockade, she will think about it -Dr. GHilma Favorsfrom palliative medicine is seeing her today to discuss pain management    PLAN: -Biopsy and PET scan reviewed with patient -Discussed option of surgery again, she declined -We will proceed first treatment Keytruda today -Lab, follow-up and second dose Keytruda in 3 weeks   No problem-specific Assessment & Plan notes found for this encounter.   No orders of the defined types were placed in this encounter.  All questions were answered. The patient knows to call the clinic with any problems, questions or concerns. No barriers to learning was detected. The total time spent in the appointment was 30 minutes.     YTruitt Merle MD 01/27/2020   I, AJoslyn Devon am acting as scribe for YTruitt Merle MD.   I have reviewed the above documentation for accuracy and completeness, and I agree with the above.

## 2020-01-25 NOTE — Telephone Encounter (Signed)
Ms Methot called.  She needs a refill for her oxycodone.  She is taking 2 tablets every 6 hours.

## 2020-01-26 ENCOUNTER — Other Ambulatory Visit: Payer: Self-pay

## 2020-01-26 ENCOUNTER — Ambulatory Visit (HOSPITAL_COMMUNITY): Payer: Medicare HMO

## 2020-01-26 ENCOUNTER — Telehealth: Payer: Self-pay

## 2020-01-26 ENCOUNTER — Other Ambulatory Visit: Payer: Medicare HMO

## 2020-01-26 ENCOUNTER — Ambulatory Visit: Payer: Medicare HMO | Admitting: Hematology

## 2020-01-26 DIAGNOSIS — R52 Pain, unspecified: Secondary | ICD-10-CM

## 2020-01-26 DIAGNOSIS — C210 Malignant neoplasm of anus, unspecified: Secondary | ICD-10-CM

## 2020-01-26 DIAGNOSIS — K6289 Other specified diseases of anus and rectum: Secondary | ICD-10-CM

## 2020-01-26 LAB — SURGICAL PATHOLOGY

## 2020-01-26 NOTE — Telephone Encounter (Signed)
I spoke with MS Humphreys.  I let her know that our pain specialist physician, Dr. Hilma Favors, is able to see her here tomorrow at 0830.  I let her know that Dr Hilma Favors requires a urine drug screen.  Ms Rau is agreeable.

## 2020-01-27 ENCOUNTER — Inpatient Hospital Stay (HOSPITAL_BASED_OUTPATIENT_CLINIC_OR_DEPARTMENT_OTHER): Payer: Medicare HMO | Admitting: Internal Medicine

## 2020-01-27 ENCOUNTER — Encounter: Payer: Self-pay | Admitting: Hematology

## 2020-01-27 ENCOUNTER — Inpatient Hospital Stay: Payer: Medicare HMO

## 2020-01-27 ENCOUNTER — Other Ambulatory Visit: Payer: Self-pay

## 2020-01-27 ENCOUNTER — Encounter: Payer: Self-pay | Admitting: Internal Medicine

## 2020-01-27 ENCOUNTER — Inpatient Hospital Stay: Payer: Medicare HMO | Attending: Hematology

## 2020-01-27 ENCOUNTER — Inpatient Hospital Stay (HOSPITAL_BASED_OUTPATIENT_CLINIC_OR_DEPARTMENT_OTHER): Payer: Medicare HMO | Admitting: Hematology

## 2020-01-27 VITALS — HR 74

## 2020-01-27 DIAGNOSIS — C211 Malignant neoplasm of anal canal: Secondary | ICD-10-CM | POA: Diagnosis present

## 2020-01-27 DIAGNOSIS — C210 Malignant neoplasm of anus, unspecified: Secondary | ICD-10-CM

## 2020-01-27 DIAGNOSIS — B20 Human immunodeficiency virus [HIV] disease: Secondary | ICD-10-CM | POA: Insufficient documentation

## 2020-01-27 DIAGNOSIS — K6289 Other specified diseases of anus and rectum: Secondary | ICD-10-CM

## 2020-01-27 DIAGNOSIS — E213 Hyperparathyroidism, unspecified: Secondary | ICD-10-CM | POA: Insufficient documentation

## 2020-01-27 DIAGNOSIS — Z515 Encounter for palliative care: Secondary | ICD-10-CM | POA: Diagnosis not present

## 2020-01-27 DIAGNOSIS — Z79899 Other long term (current) drug therapy: Secondary | ICD-10-CM | POA: Diagnosis not present

## 2020-01-27 DIAGNOSIS — Z9221 Personal history of antineoplastic chemotherapy: Secondary | ICD-10-CM | POA: Diagnosis not present

## 2020-01-27 DIAGNOSIS — Z5112 Encounter for antineoplastic immunotherapy: Secondary | ICD-10-CM | POA: Diagnosis not present

## 2020-01-27 DIAGNOSIS — Z923 Personal history of irradiation: Secondary | ICD-10-CM | POA: Diagnosis not present

## 2020-01-27 DIAGNOSIS — R52 Pain, unspecified: Secondary | ICD-10-CM | POA: Diagnosis not present

## 2020-01-27 DIAGNOSIS — M810 Age-related osteoporosis without current pathological fracture: Secondary | ICD-10-CM | POA: Diagnosis not present

## 2020-01-27 DIAGNOSIS — Z7189 Other specified counseling: Secondary | ICD-10-CM

## 2020-01-27 DIAGNOSIS — Z1329 Encounter for screening for other suspected endocrine disorder: Secondary | ICD-10-CM

## 2020-01-27 DIAGNOSIS — B1920 Unspecified viral hepatitis C without hepatic coma: Secondary | ICD-10-CM | POA: Insufficient documentation

## 2020-01-27 DIAGNOSIS — I10 Essential (primary) hypertension: Secondary | ICD-10-CM | POA: Diagnosis not present

## 2020-01-27 DIAGNOSIS — R69 Illness, unspecified: Secondary | ICD-10-CM | POA: Diagnosis not present

## 2020-01-27 LAB — CBC WITH DIFFERENTIAL (CANCER CENTER ONLY)
Abs Immature Granulocytes: 0.02 10*3/uL (ref 0.00–0.07)
Basophils Absolute: 0 10*3/uL (ref 0.0–0.1)
Basophils Relative: 0 %
Eosinophils Absolute: 0.1 10*3/uL (ref 0.0–0.5)
Eosinophils Relative: 1 %
HCT: 33 % — ABNORMAL LOW (ref 36.0–46.0)
Hemoglobin: 10.8 g/dL — ABNORMAL LOW (ref 12.0–15.0)
Immature Granulocytes: 0 %
Lymphocytes Relative: 19 %
Lymphs Abs: 1.4 10*3/uL (ref 0.7–4.0)
MCH: 31.4 pg (ref 26.0–34.0)
MCHC: 32.7 g/dL (ref 30.0–36.0)
MCV: 95.9 fL (ref 80.0–100.0)
Monocytes Absolute: 0.7 10*3/uL (ref 0.1–1.0)
Monocytes Relative: 9 %
Neutro Abs: 5.1 10*3/uL (ref 1.7–7.7)
Neutrophils Relative %: 71 %
Platelet Count: 205 10*3/uL (ref 150–400)
RBC: 3.44 MIL/uL — ABNORMAL LOW (ref 3.87–5.11)
RDW: 12.9 % (ref 11.5–15.5)
WBC Count: 7.3 10*3/uL (ref 4.0–10.5)
nRBC: 0 % (ref 0.0–0.2)

## 2020-01-27 LAB — CMP (CANCER CENTER ONLY)
ALT: <6 U/L (ref 0–44)
AST: 19 U/L (ref 15–41)
Albumin: 2.4 g/dL — ABNORMAL LOW (ref 3.5–5.0)
Alkaline Phosphatase: 90 U/L (ref 38–126)
Anion gap: 12 (ref 5–15)
BUN: 7 mg/dL — ABNORMAL LOW (ref 8–23)
CO2: 20 mmol/L — ABNORMAL LOW (ref 22–32)
Calcium: 9.5 mg/dL (ref 8.9–10.3)
Chloride: 102 mmol/L (ref 98–111)
Creatinine: 0.81 mg/dL (ref 0.44–1.00)
GFR, Est AFR Am: >60 mL/min (ref >60)
GFR, Estimated: >60 mL/min (ref >60)
Glucose, Bld: 127 mg/dL — ABNORMAL HIGH (ref 70–99)
Potassium: 3.5 mmol/L (ref 3.5–5.1)
Sodium: 134 mmol/L — ABNORMAL LOW (ref 135–145)
Total Bilirubin: 0.7 mg/dL (ref 0.3–1.2)
Total Protein: 7.7 g/dL (ref 6.5–8.1)

## 2020-01-27 LAB — TSH: TSH: 1.314 u[IU]/mL (ref 0.308–3.960)

## 2020-01-27 MED ORDER — SODIUM CHLORIDE 0.9 % IV SOLN
Freq: Once | INTRAVENOUS | Status: AC
  Filled 2020-01-27: qty 250

## 2020-01-27 MED ORDER — METHADONE HCL 5 MG PO TABS: 5.0000 mg | ORAL_TABLET | Freq: Two times a day (BID) | ORAL | 0 refills | Status: DC

## 2020-01-27 MED ORDER — SENNOSIDES-DOCUSATE SODIUM 8.6-50 MG PO TABS: 2.0000 | ORAL_TABLET | Freq: Two times a day (BID) | ORAL | 3 refills | Status: DC

## 2020-01-27 MED ORDER — SODIUM CHLORIDE 0.9 % IV SOLN
200.0000 mg | Freq: Once | INTRAVENOUS | Status: AC
  Administered 2020-01-27: 200 mg via INTRAVENOUS
  Filled 2020-01-27: qty 8

## 2020-01-27 NOTE — Progress Notes (Signed)
Vanessa Beard is a 69 year old woman who has multiple chronic medical problems including HIV, HCV, MUGUS, and is actively being treated for locally advanced anal cancer. She is s/p radiation and chemotherapy May 2021. She has complications of her anal cancer that include severe anal pain- she describes not being able to sit or stand for long periods of time. Pain is stabbing and intermittent, starts in her anus and moves through her body, worse with BM and constipation.   She currently takes 5-10mg  of oxycodone every 4 hours to be able to function. She has tried various topicals which have not helped-she is waiting on Budesonide foam to come in to pharmacy-had to be ordered.   She denies any substance abuse hx with opioids or illicit drugs.  Dr. Burr Medico refilled oxycodone on 9/8 5mg  #90.  PDMP reviewed, no red flags.  UDS ordered for baseline.  We discussed conditions of safe prescribing and patient expectations.  Assessment:  1. Multifactorial Pain  Anal pain-I examined her external anal area-she has palpable nodules In the soft tissue and a shallow ulceration, she also has abnormal tissue expansion around her anal sphincter, appears to be possibly condyloma or could be associated w/ malignancy -external tissue is not inflammed but is very tender to palpation.  Compression fractures: Multiple level  Degenerative joint dx- shoulder, back, knees-hx of orthopedic injuries and trauma  2. Constipation, she will likely need Relistor or similar- she has tried multiple home regimens. Pain with insertion of supp and enemas.  3. Insomnia: Takes ambien-this is helpful along w/pain control.  Recommendations: 1. Given the very neuropathic features of her anal pain and spasm and ineffective oxycodone even at increasing doses I recommend methadone. Will start her on 5mg  BID, she can take her oxycodone up to three times a day for breakthrough pain since she got this filled recently. I will call her in 3-5  days to adjust her dose based on her response to tx. She needs long acting pain control and methadone will help achieve this and is much lower risk for developing tolerance and side effects. I provided safety education.  2. Encouraged her to try the entecort foam- I reviewed her last CT with radiology and she has a segment of bowel that is inflammed consistent with proctitis. Constipation will make this worse so I will send in script for additional laxative.  3. FU urine, she has had hesitancy and difficulty initiating urine flow.  Will call her in 3-5 days to follow up methadone titration.  Discussed unmet needs at home and related to her emotional wellbeing. Will discuss issues more at her next visit. She also needs ACP completed.  Lane Hacker, DO Palliative Medicine

## 2020-01-27 NOTE — Progress Notes (Signed)
TSH ordered, ok to proceed with Keytruda infusion prior to lab draw.Previous baseline was 3 years ago.   Larene Beach, PharmD

## 2020-01-27 NOTE — Patient Instructions (Signed)
[facility] Discharge Instructions for Patients Receiving Chemotherapy  Today you received the following chemotherapy agents Keytruda  To help prevent nausea and vomiting after your treatment, we encourage you to take your nausea medication as directed   If you develop nausea and vomiting that is not controlled by your nausea medication, call the clinic.   BELOW ARE SYMPTOMS THAT SHOULD BE REPORTED IMMEDIATELY:  *FEVER GREATER THAN 100.5 F  *CHILLS WITH OR WITHOUT FEVER  NAUSEA AND VOMITING THAT IS NOT CONTROLLED WITH YOUR NAUSEA MEDICATION  *UNUSUAL SHORTNESS OF BREATH  *UNUSUAL BRUISING OR BLEEDING  TENDERNESS IN MOUTH AND THROAT WITH OR WITHOUT PRESENCE OF ULCERS  *URINARY PROBLEMS  *BOWEL PROBLEMS  UNUSUAL RASH Items with * indicate a potential emergency and should be followed up as soon as possible.  Feel free to call the clinic should you have any questions or concerns. The clinic phone number is [phone].  Please show the CHEMO ALERT CARD at check-in to the Emergency Department and triage nurse.  Pembrolizumab injection What is this medicine? PEMBROLIZUMAB (pem broe liz ue mab) is a monoclonal antibody. It is used to treat certain types of cancer. This medicine may be used for other purposes; ask your health care provider or pharmacist if you have questions. COMMON BRAND NAME(S): Keytruda What should I tell my health care provider before I take this medicine? They need to know if you have any of these conditions:  diabetes  immune system problems  inflammatory bowel disease  liver disease  lung or breathing disease  lupus  received or scheduled to receive an organ transplant or a stem-cell transplant that uses donor stem cells  an unusual or allergic reaction to pembrolizumab, other medicines, foods, dyes, or preservatives  pregnant or trying to get pregnant  breast-feeding How should I use this medicine? This medicine is for  infusion into a vein. It is given by a health care professional in a hospital or clinic setting. A special MedGuide will be given to you before each treatment. Be sure to read this information carefully each time. Talk to your pediatrician regarding the use of this medicine in children. While this drug may be prescribed for children as young as 6 months for selected conditions, precautions do apply. Overdosage: If you think you have taken too much of this medicine contact a poison control center or emergency room at once. NOTE: This medicine is only for you. Do not share this medicine with others. What if I miss a dose? It is important not to miss your dose. Call your doctor or health care professional if you are unable to keep an appointment. What may interact with this medicine? Interactions have not been studied. Give your health care provider a list of all the medicines, herbs, non-prescription drugs, or dietary supplements you use. Also tell them if you smoke, drink alcohol, or use illegal drugs. Some items may interact with your medicine. This list may not describe all possible interactions. Give your health care provider a list of all the medicines, herbs, non-prescription drugs, or dietary supplements you use. Also tell them if you smoke, drink alcohol, or use illegal drugs. Some items may interact with your medicine. What should I watch for while using this medicine? Your condition will be monitored carefully while you are receiving this medicine. You may need blood work done while you are taking this medicine. Do not become pregnant while taking this medicine or for 4 months after stopping it. Women should   inform their doctor if they wish to become pregnant or think they might be pregnant. There is a potential for serious side effects to an unborn child. Talk to your health care professional or pharmacist for more information. Do not breast-feed an infant while taking this medicine or for 4  months after the last dose. What side effects may I notice from receiving this medicine? Side effects that you should report to your doctor or health care professional as soon as possible:  allergic reactions like skin rash, itching or hives, swelling of the face, lips, or tongue  bloody or black, tarry  breathing problems  changes in vision  chest pain  chills  confusion  constipation  cough  diarrhea  dizziness or feeling faint or lightheaded  fast or irregular heartbeat  fever  flushing  joint pain  low blood counts - this medicine may decrease the number of Dibiasio blood cells, red blood cells and platelets. You may be at increased risk for infections and bleeding.  muscle pain  muscle weakness  pain, tingling, numbness in the hands or feet  persistent headache  redness, blistering, peeling or loosening of the skin, including inside the mouth  signs and symptoms of high blood sugar such as dizziness; dry mouth; dry skin; fruity breath; nausea; stomach pain; increased hunger or thirst; increased urination  signs and symptoms of kidney injury like trouble passing urine or change in the amount of urine  signs and symptoms of liver injury like dark urine, light-colored stools, loss of appetite, nausea, right upper belly pain, yellowing of the eyes or skin  sweating  swollen lymph nodes  weight loss Side effects that usually do not require medical attention (report to your doctor or health care professional if they continue or are bothersome):  decreased appetite  hair loss  muscle pain  tiredness This list may not describe all possible side effects. Call your doctor for medical advice about side effects. You may report side effects to FDA at 1-800-FDA-1088. Where should I keep my medicine? This drug is given in a hospital or clinic and will not be stored at home. NOTE: This sheet is a summary. It may not cover all possible information. If you have  questions about this medicine, talk to your doctor, pharmacist, or health care provider.  2020 Elsevier/Gold Standard (2019-03-11 18:07:58)  

## 2020-01-30 ENCOUNTER — Other Ambulatory Visit: Payer: Self-pay

## 2020-01-30 ENCOUNTER — Telehealth: Payer: Self-pay

## 2020-01-30 ENCOUNTER — Telehealth: Payer: Self-pay | Admitting: Hematology

## 2020-01-30 DIAGNOSIS — R300 Dysuria: Secondary | ICD-10-CM

## 2020-01-30 NOTE — Telephone Encounter (Signed)
Scheduled per los. Called and spoke with patient. Confirmed appt 

## 2020-01-30 NOTE — Telephone Encounter (Signed)
I spoke with Ms Vanessa Beard, Per Dr. Burr Medico  Ms Vanessa Beard needs to come and provide a urine specimen for u/a c and s.  Ms Vanessa Beard is able to get here tomorrow am around 9.  Lab appt made and orders placed.

## 2020-01-30 NOTE — Telephone Encounter (Signed)
Ms Boehning called stating she feels like she has a "bladder infection".  Se states it stinks when she voids, she feels like she is not emptying her bladder.  She states she is drinking alot of water.  She is afebrile.  I confirmed she is allergic to bactrim.  She did well with her keytruda infusion on Friday.

## 2020-01-31 ENCOUNTER — Other Ambulatory Visit: Payer: Medicare HMO

## 2020-01-31 ENCOUNTER — Other Ambulatory Visit: Payer: Self-pay

## 2020-01-31 DIAGNOSIS — R300 Dysuria: Secondary | ICD-10-CM

## 2020-02-06 ENCOUNTER — Other Ambulatory Visit: Payer: Self-pay | Admitting: Internal Medicine

## 2020-02-06 LAB — URINE DRUGS OF ABUSE SCREEN W ALC, ROUTINE (REF LAB)
Amphetamines, Urine: NEGATIVE ng/mL
Barbiturate, Ur: NEGATIVE ng/mL
Benzodiazepine Quant, Ur: NEGATIVE ng/mL
Cocaine (Metab.): NEGATIVE ng/mL
Ethanol U, Quan: NEGATIVE %
Methadone Screen, Urine: NEGATIVE ng/mL
Phencyclidine, Ur: NEGATIVE ng/mL
Propoxyphene, Urine: NEGATIVE ng/mL

## 2020-02-06 LAB — OPIATES CONFIRMATION, URINE: OPIATES: NEGATIVE

## 2020-02-06 LAB — PANEL 799049: Cannabinoid GC/MS, Ur: NEGATIVE

## 2020-02-06 MED ORDER — MAGNESIUM CITRATE PO SOLN: 0.5000 | ORAL | 2 refills | Status: DC | PRN

## 2020-02-06 MED ORDER — METHADONE HCL 5 MG PO TABS: 10.0000 mg | ORAL_TABLET | Freq: Two times a day (BID) | ORAL | 0 refills | Status: DC

## 2020-02-06 NOTE — Progress Notes (Signed)
Call placed to follow-up on methadone tolerance and titration-pain is much improved but still needing to take oxycodone PRN. Will increase to 10mg  BID and follow up in one week. Advised on regimen for constipation and for external anal ulcerations.

## 2020-02-08 ENCOUNTER — Ambulatory Visit: Payer: Medicare HMO | Admitting: Internal Medicine

## 2020-02-12 ENCOUNTER — Other Ambulatory Visit: Payer: Self-pay | Admitting: Physician Assistant

## 2020-02-12 DIAGNOSIS — K6289 Other specified diseases of anus and rectum: Secondary | ICD-10-CM

## 2020-02-14 ENCOUNTER — Encounter: Payer: Self-pay | Admitting: *Deleted

## 2020-02-14 NOTE — Progress Notes (Signed)
[place] [facility]  Clinical Social Work received a call from patient requesting additional help in the home.  CSW and patient discussed home care options and out of pocket cost.  Patient identified cleaning as a need in her home.  CSW and patient discussed the Cleaning for a Reason program.  Patient was agreeable to CSW completing referral.  Cleaning for a reason will contact CSW and/or patient with available resources.     Johnnye Lana, MSW, LCSW, OSW-C Clinical Social Worker [facility] [phone]

## 2020-02-15 NOTE — Progress Notes (Addendum)
Vanessa Beard OFFICE PROGRESS NOTE  Vanessa Beard Carol, MD [place] [address]  [place] [id number]  DIAGNOSIS: F/u ofanal cancer  Oncology History Overview Note  Cancer Staging Anal cancer ([place]) Staging form: Anus, AJCC 8th Edition - Clinical: Stage IV (cT2, cN0, cM1) - Signed by Vanessa Merle, MD on 08/12/2019    Anal cancer ([place])  07/19/2019 Procedure   Colonoscopy by Vanessa Beard 07/19/19  IMPRESSION - Ulcer noted at the anal canal in posterior midline canal that extends into the distal rectum on digital rectal exam, nodular and somewhat hard to palpation. Endoscopic images show nodular tissue in the area, concerning for malignant ulcer. - The examined portion of the ileum was normal. - One 3 mm polyp at the hepatic flexure, removed with a cold snare. Resected and retrieved. - One 5 mm polyp in the transverse colon, removed with a cold snare. Resected and retrieved. - Diverticulosis in the ascending colon. - The examination was otherwise normal.   07/19/2019 Initial Biopsy   Diagnosis 07/19/19 1. Transverse Colon Polyp, hepatic flexure (2) - TUBULAR ADENOMA (1 OF 3 FRAGMENTS) - BENIGN COLONIC MUCOSA (2 OF 3 FRAGMENTS) - NO HIGH GRADE DYSPLASIA OR MALIGNANCY IDENTIFIED 2. Rectum, biopsy, distal rectal anal canal - SQUAMOUS CELL CARCINOMA - SEE COMMENT   07/26/2019 Initial Diagnosis   Anal cancer ([place])   07/26/2019 Cancer Staging   Staging form: Anus, AJCC 8th Edition - Clinical: Stage IV (cT2, cN1c, cM1) - Signed by Vanessa Merle, MD on 08/12/2019   08/04/2019 PET scan   IMPRESSION: Hypermetabolic anal soft tissue mass, consistent with known primary anal carcinoma.   Sub-cm hypermetabolic lymph nodes in posterior perirectal space, right inguinal region, right iliac chain, and gastrohepatic ligament, suspicious for metastatic disease.   Small hypermetabolic left axillary lymph nodes. This would be unusual location for metastatic anal carcinoma. Recommend  clinical correlation for possibility the patient has had recent COVID vaccination in the left arm, which could explain this finding.   Multifocal airspace disease in both lower lungs with marked hypermetabolic activity. This Beard infectious or inflammatory etiology, and is not typical for pulmonary metastases. Short-term follow-up by chest CT is recommended.   08/15/2019 - 10/05/2019 Radiation Therapy   Concurrent ChemoRT with Vanessa Beard starting 08/15/19-10/05/19   08/15/2019 - 09/12/2019 Chemotherapy   Concurrent ChemoRT with Mitomycin and 5FU on week 1 and 5 starting 08/15/19 with week 5 dose on 09/12/19.    11/02/2019 Imaging   CT CAP w contrast  IMPRESSION: No acute findings in the chest, abdomen or pelvis.   Biapical and bibasilar scarring. Bronchiectasis and paraseptal emphysema in the lung bases.   Tortuous aorta.   Small umbilical hernia containing fat.   01/05/2020 Imaging   PET SCAN IMPRESSION: 1. Partial metabolic response. Persistent anal mass hypermetabolism, decreased. No residual hypermetabolic nodal metastases in the abdomen or pelvis. Left axillary hypermetabolic lymph nodes are decreased in size and metabolism. No new or progressive hypermetabolic metastatic disease. 2. Hypermetabolism associated with patchy consolidative airspace opacities in the mid to lower lungs bilaterally, decreased in extent and metabolism, favoring resolving inflammatory opacities. 3. Aortic Atherosclerosis (ICD10-I70.0) and Emphysema (ICD10-J43.9).   01/27/2020 -  Chemotherapy   The patient had pembrolizumab (KEYTRUDA) 200 mg in sodium chloride 0.9 % 50 mL chemo infusion, 200 mg, Intravenous, Once, 2 of 6 cycles Administration: 200 mg (01/27/2020)  for chemotherapy treatment.      CURRENT THERAPY: Immunotherapy with Keytruda 200 mg IV every 3 weeks.  First dose  on 01/27/2020.  INTERVAL HISTORY: NAHDIA Beard 69 y.o. female returns to the clinic today for a follow-up visit.  The  patient is feeling fair today except for continued severe rectal pain which has been occurring for several months. The pain is localized to the anal region and makes it challenging to sit for long periods of time. The patient is currently being followed by palliative care with Vanessa. Hilma Beard regarding her rectal pain. Budesonide foam was sent to her pharmacy several weeks ago; however, she states the pharmacy still does not have it in stock and needed to order it. She also was given carafate to dissolve in water and use in an applicator. This area is too tender to use this.  She is currently taking methadone for pain control and oxycodone for breakthrough pain. However, she states that she lost her oxycodone last week. Prior to her losing her oxycodone, she had been taking this BID reportedly. Of note, her UDS was negative for opioids on 01/27/20. Her last refill of oxycodone was on 01/25/20 in which 90 tablets were given. This was verified by PMP aware database.   Her other concern today is related to decreased appetite. She lost about 9 lbs since her last appointment. She states that she does not have anyone to do the grocery shopping for her and meal preparation. She was seen several months ago by a member of the nutritionist team. She is in the process of working with social work who is working on an Land for a personal care assistant/aid.   The patient had been seen by Vanessa Beard from surgery who offered surgery, which is likely the only curative option for her malignancy.  The patient declined surgery.  Therefore, Vanessa Beard started the patient on immunotherapy with [facility].  The patient is status post her first cycle and she tolerated it well without any concerning adverse side effects.  She denies any recent fever, chills, or night sweats. She denies any nausea, vomiting, or diarrhea.  The patient is prescribed Relistor for constipation. She denies any rashes or skin changes.  She denies any signs and  symptoms of infection.  She denies any chest pain, shortness of breath, or cough. The patient is here today for evaluation and repeat blood work before starting cycle #2.  MEDICAL HISTORY: Past Medical History:  Diagnosis Date  . Allergic rhinitis   . Biliary colic   . Cancer ([place])   . Gall bladder disease \  . Hepatitis C   . Herpes   . HIV infection ([place])   . Hyperparathyroidism ([place])   . Hypertension   . Insomnia   . Osteoporosis   . Pneumonia    2010    ALLERGIES:  is allergic to dapsone, retrovir [zidovudine], and sulfamethoxazole-trimethoprim.  MEDICATIONS:  Current Outpatient Medications  Medication Sig Dispense Refill  . AMBULATORY NON FORMULARY MEDICATION Nitroglycerine ointment 0.125 %  Apply a pea sized amount internally four times daily. 30 g 1  . amLODipine (NORVASC) 10 MG tablet Take 1 tablet (10 mg total) by mouth daily. 90 tablet 3  . Budesonide 2 MG/ACT FOAM Initial: 2 mg (one metered dose) twice daily (morning and evening) for 2 weeks followed by maintenance: 2 mg (one metered dose) once daily (evening) for 4 weeks. 33.4 g 0  . diphenoxylate-atropine (LOMOTIL) 2.5-0.025 MG tablet Take 1-2 tablets by mouth 4 (four) times daily as needed for diarrhea or loose stools. 30 tablet 1  . DULoxetine (CYMBALTA) 20 MG capsule Take  1 capsule (20 mg total) by mouth daily. 30 capsule 2  . hydrocortisone (ANUSOL-HC) 25 MG suppository Place 1 suppository (25 mg total) rectally 2 (two) times daily. 24 suppository 1  . hydrocortisone 1 % ointment Apply 1 application topically daily as needed for hemorrhoids.    . lidocaine (XYLOCAINE) 2 % solution Apply topically to anus as needed for pain relief 100 mL 1  . magnesium citrate SOLN Take 148-296 mLs (0.5-1 Bottles total) by mouth as needed for moderate constipation or severe constipation. 296 mL 2  . methadone (DOLOPHINE) 5 MG tablet Take 2 tablets (10 mg total) by mouth every 12 (twelve) hours. 30 tablet 0  . oxyCODONE (OXY  IR/ROXICODONE) 5 MG immediate release tablet Take 1-2 tablets (5-10 mg total) by mouth every 6 (six) hours as needed for severe pain. 90 tablet 0  . oxyCODONE-acetaminophen (PERCOCET) 5-325 MG tablet Take 1 tablet by mouth every 4 (four) hours as needed. 10 tablet 0  . senna-docusate (SENOKOT-S) 8.6-50 MG tablet Take 2 tablets by mouth 2 (two) times daily. 120 tablet 3  . sucralfate (CARAFATE) 1 g tablet TAKE 2 TABLETS AND MIX IN 4.5ML OF WATER AND PLACE IN ENEMA APPLICATOR USE TWICE DAILY 180 tablet 0  . TRIUMEQ 600-50-300 MG tablet TAKE 1 TABLET BY MOUTH DAILY (Patient taking differently: Take 1 tablet by mouth daily. ) 30 tablet 3  . valACYclovir (VALTREX) 1000 MG tablet Take 1 tablet (1,000 mg total) by mouth 3 (three) times daily. (Patient taking differently: Take 1,000 mg by mouth daily. ) 30 tablet 1  . zolpidem (AMBIEN) 10 MG tablet Take 1 tablet (10 mg total) by mouth at bedtime as needed. (Patient taking differently: Take 10 mg by mouth at bedtime as needed for sleep. ) 30 tablet 1  . potassium chloride SA (KLOR-CON) 20 MEQ tablet Take 1 tablet (20 mEq total) by mouth daily. 6 tablet 0   No current facility-administered medications for this visit.   Facility-Administered Medications Ordered in Other Visits  Medication Dose Route Frequency Provider Last Rate Last Admin  . pembrolizumab (KEYTRUDA) 200 mg in sodium chloride 0.9 % 50 mL chemo infusion  200 mg Intravenous Once Vanessa Merle, MD        SURGICAL HISTORY:  Past Surgical History:  Procedure Laterality Date  . CHOLECYSTECTOMY N/A 01/09/2015   Procedure: LAPAROSCOPIC CHOLECYSTECTOMY;  Surgeon: [name], MD;  Location: [facility];  Service: General;  Laterality: N/A;  . ECTOPIC PREGNANCY SURGERY    . ESOPHAGOGASTRODUODENOSCOPY (EGD) WITH PROPOFOL N/A 09/08/2019   Procedure: ESOPHAGOGASTRODUODENOSCOPY (EGD) WITH PROPOFOL;  Surgeon: Milus Banister, MD;  Location: [facility];  Service: Endoscopy;  Laterality: N/A;  . IR FLUORO  GUIDE CV LINE LEFT  08/15/2019  . ORIF TIBIA PLATEAU Right 02/18/2017   Procedure: OPEN REDUCTION INTERNAL FIXATION (ORIF) RIGHT TIBIAL PLATEAU;  Surgeon: Leandrew Koyanagi, MD;  Location: [facility];  Service: Orthopedics;  Laterality: Right;  . SHOULDER SURGERY Right 12/2014  . UPPER ESOPHAGEAL ENDOSCOPIC ULTRASOUND (EUS) N/A 09/08/2019   Procedure: UPPER ESOPHAGEAL ENDOSCOPIC ULTRASOUND (EUS);  Surgeon: Milus Banister, MD;  Location: [facility];  Service: Endoscopy;  Laterality: N/A;    REVIEW OF SYSTEMS:   Review of Systems  Constitutional: Positive for weight loss and decreased appetite. Negative for chills, fatigue, and fever.   HENT: Negative for mouth sores, nosebleeds, sore throat and trouble swallowing.   Eyes: Negative for eye problems and icterus.  Respiratory: Negative for cough, hemoptysis, shortness of breath and wheezing.  Cardiovascular: Negative for chest pain and leg swelling.  Gastrointestinal: Positive for rectal pain. Negative for abdominal pain, constipation, diarrhea, nausea and vomiting.  Genitourinary: Negative for bladder incontinence, difficulty urinating, dysuria, frequency and hematuria.   Musculoskeletal: Negative for back pain, gait problem, neck pain and neck stiffness.  Skin: Negative for itching and rash.  Neurological: Negative for dizziness, extremity weakness, gait problem, headaches, light-headedness and seizures.  Hematological: Negative for adenopathy. Does not bruise/bleed easily.  Psychiatric/Behavioral: Negative for confusion, depression and sleep disturbance. The patient is not nervous/anxious.     PHYSICAL EXAMINATION:  Blood pressure (!) 131/101, pulse (!) 106, temperature 97.8 F (36.6 C), temperature source Tympanic, resp. rate 20, height 5' (1.524 m), weight 126 lb 9.6 oz (57.4 kg), SpO2 95 %.  ECOG PERFORMANCE STATUS: 1 - Symptomatic but completely ambulatory  Physical Exam  Constitutional: Oriented to person, place, and time and  well-developed, well-nourished, and in no distress. No distress.  HENT:  Head: Normocephalic and atraumatic.  Mouth/Throat: Oropharynx is clear and moist. No oropharyngeal exudate.  Eyes: Conjunctivae are normal. Right eye exhibits no discharge. Left eye exhibits no discharge. No scleral icterus.  Neck: Normal range of motion. Neck supple.  Cardiovascular: Normal rate, regular rhythm, normal heart sounds and intact distal pulses.   Pulmonary/Chest: Effort normal and breath sounds normal. No respiratory distress. No wheezes. No rales.  Abdominal: Soft. Bowel sounds are normal. Exhibits no distension and no mass. There is no tenderness.  Musculoskeletal: Normal range of motion. Exhibits no edema.  Lymphadenopathy:    No cervical adenopathy.  Neurological: Alert and oriented to person, place, and time. Exhibits normal muscle tone. The patient was examined in the wheelchair.  Skin: Skin is warm and dry. No rash noted. Not diaphoretic. No erythema. No pallor.  Psychiatric: Mood, memory and judgment normal.  Vitals reviewed.  LABORATORY DATA: Lab Results  Component Value Date   WBC 7.1 02/17/2020   HGB 10.9 (L) 02/17/2020   HCT 33.4 (L) 02/17/2020   MCV 95.2 02/17/2020   PLT 193 02/17/2020      Chemistry      Component Value Date/Time   NA 134 (L) 02/17/2020 0824   NA 142 08/31/2013 1515   K 3.3 (L) 02/17/2020 0824   K 3.4 (L) 08/31/2013 1515   CL 102 02/17/2020 0824   CL 104 07/07/2012 1512   CO2 24 02/17/2020 0824   CO2 27 08/31/2013 1515   BUN 12 02/17/2020 0824   BUN 14.7 08/31/2013 1515   CREATININE 0.82 02/17/2020 0824   CREATININE 0.87 08/08/2019 1535   CREATININE 0.8 08/31/2013 1515      Component Value Date/Time   CALCIUM 9.6 02/17/2020 0824   CALCIUM 9.6 08/31/2013 1515   ALKPHOS 92 02/17/2020 0824   ALKPHOS 99 08/31/2013 1515   AST 22 02/17/2020 0824   AST 25 08/31/2013 1515   ALT 8 02/17/2020 0824   ALT 9 08/31/2013 1515   BILITOT 0.7 02/17/2020 0824    BILITOT 0.67 08/31/2013 1515       RADIOGRAPHIC STUDIES:  [place] CORE BIOPSY (LYMPH NODES)  Result Date: 01/24/2020 INDICATION: History of anal cell carcinoma, now with hypermetabolic left axillary lymphadenopathy. Please perform ultrasound-guided biopsy for tissue diagnostic purposes. EXAM: ULTRASOUND-GUIDED LEFT AXILLARY LYMPH NODE BIOPSY COMPARISON:  PET-CT-01/05/2020 MEDICATIONS: None ANESTHESIA/SEDATION: Moderate (conscious) sedation was employed during this procedure. A total of Versed 2 mg and Fentanyl 100 mcg was administered intravenously. Moderate Sedation Time: 10 minutes. The patient's level of consciousness and vital signs  were monitored continuously by radiology nursing throughout the procedure under my direct supervision. COMPLICATIONS: None immediate. TECHNIQUE: Informed written consent was obtained from the patient after a discussion of the risks, benefits and alternatives to treatment. Questions regarding the procedure were encouraged and answered. Initial ultrasound scanning demonstrated an approximately 1.0 x 0.5 cm left axillary lymph node (image 3), correlating with the hypermetabolic left axillary lymph node seen on preceding PET-CT image 52, series 605. An ultrasound image was saved for documentation purposes. The procedure was planned. A timeout was performed prior to the initiation of the procedure. The operative was prepped and draped in the usual sterile fashion, and a sterile drape was applied covering the operative field. A timeout was performed prior to the initiation of the procedure. Local anesthesia was provided with 1% lidocaine with epinephrine. Under direct ultrasound guidance, an 18 gauge core needle device was utilized to obtain to obtain 6 core needle biopsies of the hypermetabolic left axillary lymph node. The samples were placed in saline and submitted to pathology. The needle was removed and hemostasis was achieved with manual compression. Post procedure scan was  negative for significant hematoma. A dressing was placed. The patient tolerated the procedure well without immediate postprocedural complication. IMPRESSION: Technically successful ultrasound guided biopsy of hypermetabolic left axillary lymph node. Electronically Signed   By: Sandi Mariscal M.D.   On: 01/24/2020 15:08     ASSESSMENT/PLAN:  Vanessa Beard is a 69 y.o. 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 Her biopsy results show Squamous Cell Carcinoma.This is consistent with anal cancer. She has HIV, which is a high risk for anal cancer. -HerPET from 08/04/19 showedhypermetabolicanal mass, hypermetabolicLNsin posterior perirectal space, right inguinal region, right iliac chain and gastrohepatic ligament suspicious for metastasis. Scan also showed hypermetabolic left axillary LNs, which is unusual metastatic pattern. Shedenies recent injury or injections in the left arm. -She completed concurrent chemoradiation -Posttreatment PET scan showed residual hypermetabolic primary tumor, and hypermetabolic left axillary lymph node, no other metastasis on the PET scan. -Her left axillary lymph node biopsy was benign. -She was seen by Vanessa Beard lately, surgery was offered, patient declined. Vanessa Beard had also discussed that surgery is probably the only option to cure her cancer, patient still declined surgery due to the concern of colostomy bag. -Therefore, Vanessa Beard recommended Keytruda 200mg  IV every 3 weeks.  She is status post her first cycle and tolerated it well.  -Labs were reviewed.  Recommend that she proceed with cycle #2 today scheduled -F/U in 3 weeks with cycle #3.   2.  Comorbidities: HTN, Hyperparathyroidism, Osteoporosis -Continue medications and f/u  with PCP.  -Her BP was elevated today (02/17/20). She did not take her norvasc prior to coming to her appointment today. I advised her to take it upon returning home today and to monitor it.   3. Hypokalemia -Have have sent a 6 day supply of 20 meq of potassium chloride to the patient's pharmacy.   4. Social Support  -She is divorced, lives alone and has 2 adult children who live in [place]  -She is getting bachelors degree in Criminal Justice through Online schooling, she plans to complete in 10/2019 but may postpone her final classes.  -She is currently working at [employer] as [employer] and works 8-9 hours every day. She  notes her work is flexible to take time off if needed. -Social work has been meeting with the patient.  -She gets transportation assistance from our cancer center -The patient mentioned today that she would benefit from a home health aid. Social work is presently filling out an application for her. She states she needs help with cooking/meal prep. I have reached out to social work to follow up on the status of this application.  5. Weight loss -The patient attributes this to decreased appetite and challenges with transportation/groceries/meal preparation.  -I will reach out to nutrition to see if they can evaluate her and to see if the program still exists for assistance with food.  -I encouraged her to drink nutritional supplement drinks, which she has tried in the past. I also encouraged her to use home grocery delivery services.  -She is working with social work to get a home health aid who the patient is hopeful with assist her with meal prep.   6. Anal pain  -It has been quite severe lately -She is on Cymbalta 20 mg daily.  -She is being followed by palliative care and Vanessa. Hilma Beard. The patient is currently taking methadone and taking oxycodone PRN. She is following with Vanessa. Hilma Beard who is titration her dose.  -Vanessa. Hilma Beard also recommended entecort foam and  carafate to be administered via rectal applicator. The entercort is being ordered to her pharmacy. She was instructed to call her pharmacy for a status update on this.  -The patient states she "lost" her oxycodone last week. Her PMP aware her last refill was 90 tablets on 01/25/20. Of note, her urine drug screen was negative for opioids on 01/27/20.  -Discussed she   7. Hep C, HIV -She underwentsuccessfulHep C treatment 2 years ago ([id number]) -Her HIV was contracted through sex. Her HIV is well controlled. She will continue to f/u ID Vanessa Graylon Good.Last CD4 count was 386 on 02/07/2019.    PLAN: -Cycle #2 Keytruda today as scheduled  -Lab, follow-up and 3rd dose Keytruda in 3 weeks -Instructed to take BP meds upon returning home -Sent 6 day supply of 20 meq daily of KCl -Messaged SW and RD to evaluate patient and status update.  -Discussed with patient that we would not be able to refill oxycodone until she is due for her next refill. I will defer to Vanessa. Hilma Beard regarding when/if she is planning to continue prescribing oxycodone.       No orders of the defined types were placed in this encounter.    Jilda Kress L Nasir Bright, PA-C 02/17/20

## 2020-02-17 ENCOUNTER — Encounter: Payer: Self-pay | Admitting: Physician Assistant

## 2020-02-17 ENCOUNTER — Inpatient Hospital Stay: Payer: Medicare HMO

## 2020-02-17 ENCOUNTER — Other Ambulatory Visit: Payer: Self-pay

## 2020-02-17 ENCOUNTER — Telehealth: Payer: Self-pay | Admitting: Physician Assistant

## 2020-02-17 ENCOUNTER — Inpatient Hospital Stay: Payer: Medicare HMO | Attending: Hematology | Admitting: Physician Assistant

## 2020-02-17 VITALS — BP 131/101 | HR 106 | Temp 97.8°F | Resp 20 | Ht 60.0 in | Wt 126.6 lb

## 2020-02-17 VITALS — BP 111/74 | HR 91

## 2020-02-17 DIAGNOSIS — K429 Umbilical hernia without obstruction or gangrene: Secondary | ICD-10-CM | POA: Insufficient documentation

## 2020-02-17 DIAGNOSIS — I10 Essential (primary) hypertension: Secondary | ICD-10-CM

## 2020-02-17 DIAGNOSIS — C210 Malignant neoplasm of anus, unspecified: Secondary | ICD-10-CM

## 2020-02-17 DIAGNOSIS — C211 Malignant neoplasm of anal canal: Secondary | ICD-10-CM | POA: Insufficient documentation

## 2020-02-17 DIAGNOSIS — I700 Atherosclerosis of aorta: Secondary | ICD-10-CM | POA: Diagnosis not present

## 2020-02-17 DIAGNOSIS — E876 Hypokalemia: Secondary | ICD-10-CM | POA: Diagnosis not present

## 2020-02-17 DIAGNOSIS — Z5112 Encounter for antineoplastic immunotherapy: Secondary | ICD-10-CM | POA: Insufficient documentation

## 2020-02-17 DIAGNOSIS — D259 Leiomyoma of uterus, unspecified: Secondary | ICD-10-CM | POA: Insufficient documentation

## 2020-02-17 DIAGNOSIS — Z7189 Other specified counseling: Secondary | ICD-10-CM

## 2020-02-17 DIAGNOSIS — R634 Abnormal weight loss: Secondary | ICD-10-CM | POA: Insufficient documentation

## 2020-02-17 DIAGNOSIS — E213 Hyperparathyroidism, unspecified: Secondary | ICD-10-CM | POA: Insufficient documentation

## 2020-02-17 DIAGNOSIS — Z79899 Other long term (current) drug therapy: Secondary | ICD-10-CM | POA: Insufficient documentation

## 2020-02-17 DIAGNOSIS — Z9049 Acquired absence of other specified parts of digestive tract: Secondary | ICD-10-CM | POA: Insufficient documentation

## 2020-02-17 DIAGNOSIS — B20 Human immunodeficiency virus [HIV] disease: Secondary | ICD-10-CM | POA: Diagnosis not present

## 2020-02-17 DIAGNOSIS — Z9221 Personal history of antineoplastic chemotherapy: Secondary | ICD-10-CM | POA: Insufficient documentation

## 2020-02-17 DIAGNOSIS — D123 Benign neoplasm of transverse colon: Secondary | ICD-10-CM | POA: Insufficient documentation

## 2020-02-17 DIAGNOSIS — J439 Emphysema, unspecified: Secondary | ICD-10-CM | POA: Diagnosis not present

## 2020-02-17 DIAGNOSIS — K6289 Other specified diseases of anus and rectum: Secondary | ICD-10-CM | POA: Diagnosis not present

## 2020-02-17 DIAGNOSIS — B1920 Unspecified viral hepatitis C without hepatic coma: Secondary | ICD-10-CM | POA: Diagnosis not present

## 2020-02-17 DIAGNOSIS — M810 Age-related osteoporosis without current pathological fracture: Secondary | ICD-10-CM | POA: Insufficient documentation

## 2020-02-17 DIAGNOSIS — Z923 Personal history of irradiation: Secondary | ICD-10-CM | POA: Diagnosis not present

## 2020-02-17 DIAGNOSIS — J479 Bronchiectasis, uncomplicated: Secondary | ICD-10-CM | POA: Diagnosis not present

## 2020-02-17 DIAGNOSIS — K5730 Diverticulosis of large intestine without perforation or abscess without bleeding: Secondary | ICD-10-CM | POA: Insufficient documentation

## 2020-02-17 DIAGNOSIS — Z881 Allergy status to other antibiotic agents status: Secondary | ICD-10-CM | POA: Diagnosis not present

## 2020-02-17 DIAGNOSIS — Z1329 Encounter for screening for other suspected endocrine disorder: Secondary | ICD-10-CM

## 2020-02-17 LAB — CMP (CANCER CENTER ONLY)
ALT: 8 U/L (ref 0–44)
AST: 22 U/L (ref 15–41)
Albumin: 2.2 g/dL — ABNORMAL LOW (ref 3.5–5.0)
Alkaline Phosphatase: 92 U/L (ref 38–126)
Anion gap: 8 (ref 5–15)
BUN: 12 mg/dL (ref 8–23)
CO2: 24 mmol/L (ref 22–32)
Calcium: 9.6 mg/dL (ref 8.9–10.3)
Chloride: 102 mmol/L (ref 98–111)
Creatinine: 0.82 mg/dL (ref 0.44–1.00)
GFR, Est AFR Am: >60 mL/min (ref >60)
GFR, Estimated: >60 mL/min (ref >60)
Glucose, Bld: 130 mg/dL — ABNORMAL HIGH (ref 70–99)
Potassium: 3.3 mmol/L — ABNORMAL LOW (ref 3.5–5.1)
Sodium: 134 mmol/L — ABNORMAL LOW (ref 135–145)
Total Bilirubin: 0.7 mg/dL (ref 0.3–1.2)
Total Protein: 7.3 g/dL (ref 6.5–8.1)

## 2020-02-17 LAB — CBC WITH DIFFERENTIAL (CANCER CENTER ONLY)
Abs Immature Granulocytes: 0.02 10*3/uL (ref 0.00–0.07)
Basophils Absolute: 0 10*3/uL (ref 0.0–0.1)
Basophils Relative: 0 %
Eosinophils Absolute: 0 10*3/uL (ref 0.0–0.5)
Eosinophils Relative: 0 %
HCT: 33.4 % — ABNORMAL LOW (ref 36.0–46.0)
Hemoglobin: 10.9 g/dL — ABNORMAL LOW (ref 12.0–15.0)
Immature Granulocytes: 0 %
Lymphocytes Relative: 23 %
Lymphs Abs: 1.6 10*3/uL (ref 0.7–4.0)
MCH: 31.1 pg (ref 26.0–34.0)
MCHC: 32.6 g/dL (ref 30.0–36.0)
MCV: 95.2 fL (ref 80.0–100.0)
Monocytes Absolute: 0.5 10*3/uL (ref 0.1–1.0)
Monocytes Relative: 7 %
Neutro Abs: 4.9 10*3/uL (ref 1.7–7.7)
Neutrophils Relative %: 70 %
Platelet Count: 193 10*3/uL (ref 150–400)
RBC: 3.51 MIL/uL — ABNORMAL LOW (ref 3.87–5.11)
RDW: 15.3 % (ref 11.5–15.5)
WBC Count: 7.1 10*3/uL (ref 4.0–10.5)
nRBC: 0 % (ref 0.0–0.2)

## 2020-02-17 LAB — TSH: TSH: 2.763 u[IU]/mL (ref 0.308–3.960)

## 2020-02-17 MED ORDER — SODIUM CHLORIDE 0.9 % IV SOLN
Freq: Once | INTRAVENOUS | Status: AC
  Filled 2020-02-17: qty 250

## 2020-02-17 MED ORDER — POTASSIUM CHLORIDE CRYS ER 20 MEQ PO TBCR: 20.0000 meq | EXTENDED_RELEASE_TABLET | Freq: Every day | ORAL | 0 refills | Status: DC

## 2020-02-17 MED ORDER — SODIUM CHLORIDE 0.9 % IV SOLN
200.0000 mg | Freq: Once | INTRAVENOUS | Status: AC
  Administered 2020-02-17: 200 mg via INTRAVENOUS
  Filled 2020-02-17: qty 8

## 2020-02-17 NOTE — Patient Instructions (Signed)
[facility] Discharge Instructions for Patients Receiving Chemotherapy  Today you received the following chemotherapy agents Keytruda  To help prevent nausea and vomiting after your treatment, we encourage you to take your nausea medication as directed   If you develop nausea and vomiting that is not controlled by your nausea medication, call the clinic.   BELOW ARE SYMPTOMS THAT SHOULD BE REPORTED IMMEDIATELY:  *FEVER GREATER THAN 100.5 F  *CHILLS WITH OR WITHOUT FEVER  NAUSEA AND VOMITING THAT IS NOT CONTROLLED WITH YOUR NAUSEA MEDICATION  *UNUSUAL SHORTNESS OF BREATH  *UNUSUAL BRUISING OR BLEEDING  TENDERNESS IN MOUTH AND THROAT WITH OR WITHOUT PRESENCE OF ULCERS  *URINARY PROBLEMS  *BOWEL PROBLEMS  UNUSUAL RASH Items with * indicate a potential emergency and should be followed up as soon as possible.  Feel free to call the clinic should you have any questions or concerns. The clinic phone number is [phone].  Please show the CHEMO ALERT CARD at check-in to the Emergency Department and triage nurse.  Pembrolizumab injection What is this medicine? PEMBROLIZUMAB (pem broe liz ue mab) is a monoclonal antibody. It is used to treat certain types of cancer. This medicine may be used for other purposes; ask your health care provider or pharmacist if you have questions. COMMON BRAND NAME(S): Keytruda What should I tell my health care provider before I take this medicine? They need to know if you have any of these conditions:  diabetes  immune system problems  inflammatory bowel disease  liver disease  lung or breathing disease  lupus  received or scheduled to receive an organ transplant or a stem-cell transplant that uses donor stem cells  an unusual or allergic reaction to pembrolizumab, other medicines, foods, dyes, or preservatives  pregnant or trying to get pregnant  breast-feeding How should I use this medicine? This medicine is for  infusion into a vein. It is given by a health care professional in a hospital or clinic setting. A special MedGuide will be given to you before each treatment. Be sure to read this information carefully each time. Talk to your pediatrician regarding the use of this medicine in children. While this drug may be prescribed for children as young as 6 months for selected conditions, precautions do apply. Overdosage: If you think you have taken too much of this medicine contact a poison control center or emergency room at once. NOTE: This medicine is only for you. Do not share this medicine with others. What if I miss a dose? It is important not to miss your dose. Call your doctor or health care professional if you are unable to keep an appointment. What may interact with this medicine? Interactions have not been studied. Give your health care provider a list of all the medicines, herbs, non-prescription drugs, or dietary supplements you use. Also tell them if you smoke, drink alcohol, or use illegal drugs. Some items may interact with your medicine. This list may not describe all possible interactions. Give your health care provider a list of all the medicines, herbs, non-prescription drugs, or dietary supplements you use. Also tell them if you smoke, drink alcohol, or use illegal drugs. Some items may interact with your medicine. What should I watch for while using this medicine? Your condition will be monitored carefully while you are receiving this medicine. You may need blood work done while you are taking this medicine. Do not become pregnant while taking this medicine or for 4 months after stopping it. Women should   inform their doctor if they wish to become pregnant or think they might be pregnant. There is a potential for serious side effects to an unborn child. Talk to your health care professional or pharmacist for more information. Do not breast-feed an infant while taking this medicine or for 4  months after the last dose. What side effects may I notice from receiving this medicine? Side effects that you should report to your doctor or health care professional as soon as possible:  allergic reactions like skin rash, itching or hives, swelling of the face, lips, or tongue  bloody or black, tarry  breathing problems  changes in vision  chest pain  chills  confusion  constipation  cough  diarrhea  dizziness or feeling faint or lightheaded  fast or irregular heartbeat  fever  flushing  joint pain  low blood counts - this medicine may decrease the number of [place] blood cells, red blood cells and platelets. You may be at increased risk for infections and bleeding.  muscle pain  muscle weakness  pain, tingling, numbness in the hands or feet  persistent headache  redness, blistering, peeling or loosening of the skin, including inside the mouth  signs and symptoms of high blood sugar such as dizziness; dry mouth; dry skin; fruity breath; nausea; stomach pain; increased hunger or thirst; increased urination  signs and symptoms of kidney injury like trouble passing urine or change in the amount of urine  signs and symptoms of liver injury like dark urine, light-colored stools, loss of appetite, nausea, right upper belly pain, yellowing of the eyes or skin  sweating  swollen lymph nodes  weight loss Side effects that usually do not require medical attention (report to your doctor or health care professional if they continue or are bothersome):  decreased appetite  hair loss  muscle pain  tiredness This list may not describe all possible side effects. Call your doctor for medical advice about side effects. You may report side effects to FDA at 1-800-FDA-1088. Where should I keep my medicine? This drug is given in a hospital or clinic and will not be stored at home. NOTE: This sheet is a summary. It may not cover all possible information. If you have  questions about this medicine, talk to your doctor, pharmacist, or health care provider.  2020 Elsevier/Gold Standard (2019-03-11 18:07:58)  

## 2020-02-17 NOTE — Telephone Encounter (Signed)
I called the patient to update her after talking to RD and social work today. Updated her that the registered dietician is going to follow up with her, as well as arrange for her to receive a food bag when she comes in. We discussed instacart or meals on wheels. However, there is an 8 month wait list and often incurs a charge. The patient's medicare does not cover any in home aide, she would have to qualify for medicaid in order to get consistent in home aid. Social work did refer her to a charity called [employer] to help with housecleaning by a commercial service for patient's undergoing cancer treatment. The patient will receive an email if/when she is approved.

## 2020-02-23 ENCOUNTER — Telehealth: Payer: Self-pay

## 2020-02-23 NOTE — Telephone Encounter (Signed)
Vanessa Beard called stating she is having a small amount of rectal bleeding.  There is no blood in the toilet after a bm.  She will monitor and call if the bleeding increases.  Dr. Burr Medico aware.

## 2020-02-24 ENCOUNTER — Other Ambulatory Visit: Payer: Self-pay | Admitting: Internal Medicine

## 2020-02-24 DIAGNOSIS — C210 Malignant neoplasm of anus, unspecified: Secondary | ICD-10-CM

## 2020-02-24 DIAGNOSIS — K6289 Other specified diseases of anus and rectum: Secondary | ICD-10-CM

## 2020-02-24 MED ORDER — OXYCODONE HCL 10 MG PO TABS: 10.0000 mg | ORAL_TABLET | Freq: Four times a day (QID) | ORAL | 0 refills | Status: DC | PRN

## 2020-02-24 NOTE — Progress Notes (Signed)
Discontinued methadone at request of patient. Refilled oxycodone- need to fu in 2-3 weeks before next refill.  Lane Hacker, DO Palliative Medicine

## 2020-02-26 ENCOUNTER — Encounter (HOSPITAL_COMMUNITY): Payer: Self-pay

## 2020-02-26 ENCOUNTER — Emergency Department (HOSPITAL_COMMUNITY)
Admission: EM | Admit: 2020-02-26 | Discharge: 2020-02-26 | Disposition: A | Discharge status: Home / Self Care | Payer: Medicare HMO | Attending: Emergency Medicine | Admitting: Emergency Medicine

## 2020-02-26 ENCOUNTER — Other Ambulatory Visit: Payer: Self-pay

## 2020-02-26 DIAGNOSIS — Z79899 Other long term (current) drug therapy: Secondary | ICD-10-CM | POA: Diagnosis not present

## 2020-02-26 DIAGNOSIS — Z85048 Personal history of other malignant neoplasm of rectum, rectosigmoid junction, and anus: Secondary | ICD-10-CM | POA: Insufficient documentation

## 2020-02-26 DIAGNOSIS — I10 Essential (primary) hypertension: Secondary | ICD-10-CM | POA: Insufficient documentation

## 2020-02-26 DIAGNOSIS — R300 Dysuria: Secondary | ICD-10-CM | POA: Diagnosis not present

## 2020-02-26 LAB — CBC
HCT: 29.4 % — ABNORMAL LOW (ref 36.0–46.0)
Hemoglobin: 9.7 g/dL — ABNORMAL LOW (ref 12.0–15.0)
MCH: 31.6 pg (ref 26.0–34.0)
MCHC: 33 g/dL (ref 30.0–36.0)
MCV: 95.8 fL (ref 80.0–100.0)
Platelets: 206 10*3/uL (ref 150–400)
RBC: 3.07 MIL/uL — ABNORMAL LOW (ref 3.87–5.11)
RDW: 16 % — ABNORMAL HIGH (ref 11.5–15.5)
WBC: 7.6 10*3/uL (ref 4.0–10.5)
nRBC: 0 % (ref 0.0–0.2)

## 2020-02-26 LAB — COMPREHENSIVE METABOLIC PANEL
ALT: 15 U/L (ref 0–44)
AST: 28 U/L (ref 15–41)
Albumin: 2.6 g/dL — ABNORMAL LOW (ref 3.5–5.0)
Alkaline Phosphatase: 81 U/L (ref 38–126)
Anion gap: 9 (ref 5–15)
BUN: 12 mg/dL (ref 8–23)
CO2: 26 mmol/L (ref 22–32)
Calcium: 10.7 mg/dL — ABNORMAL HIGH (ref 8.9–10.3)
Chloride: 96 mmol/L — ABNORMAL LOW (ref 98–111)
Creatinine, Ser: 0.73 mg/dL (ref 0.44–1.00)
GFR, Estimated: >60 mL/min (ref >60)
Glucose, Bld: 102 mg/dL — ABNORMAL HIGH (ref 70–99)
Potassium: 4.5 mmol/L (ref 3.5–5.1)
Sodium: 131 mmol/L — ABNORMAL LOW (ref 135–145)
Total Bilirubin: 1 mg/dL (ref 0.3–1.2)
Total Protein: 7.6 g/dL (ref 6.5–8.1)

## 2020-02-26 LAB — URINALYSIS, ROUTINE W REFLEX MICROSCOPIC
Bilirubin Urine: NEGATIVE
Glucose, UA: NEGATIVE mg/dL
Hgb urine dipstick: NEGATIVE
Ketones, ur: NEGATIVE mg/dL
Leukocytes,Ua: NEGATIVE
Nitrite: NEGATIVE
Protein, ur: NEGATIVE mg/dL
Specific Gravity, Urine: 1.016 (ref 1.005–1.030)
pH: 6 (ref 5.0–8.0)

## 2020-02-26 MED ORDER — NITROFURANTOIN MONOHYD MACRO 100 MG PO CAPS: 100.0000 mg | ORAL_CAPSULE | Freq: Two times a day (BID) | ORAL | 0 refills | Status: DC

## 2020-02-26 MED ORDER — CEPHALEXIN 500 MG PO CAPS: 500.0000 mg | ORAL_CAPSULE | Freq: Two times a day (BID) | ORAL | 0 refills | Status: AC

## 2020-02-26 NOTE — ED Triage Notes (Signed)
Pt arrived via walk in, c/o painful urination and retention x1 week. Denies any vomiting or diarrhea, denies any fevers/chills

## 2020-02-26 NOTE — Discharge Instructions (Signed)
Please take all of your antibiotics until finished!   You may develop abdominal discomfort or diarrhea from the antibiotic.  You may help offset this with probiotics which you can buy or get in yogurt. Do not eat or take the probiotics until 2 hours after your antibiotic.   Follow-up with your primary care provider on this matter.

## 2020-02-26 NOTE — ED Notes (Signed)
Bladder scan detected 157 ml volume in bladder

## 2020-02-26 NOTE — ED Provider Notes (Signed)
[facility] DEPT Provider Note   CSN: [id number] Arrival date & time: 02/26/20  1214     History Chief Complaint  Patient presents with  . Dysuria    Vanessa Beard is a 69 y.o. female.  HPI      Vanessa Beard is a 69 y.o. female, with a history of anal cancer, hepatitis C, HIV, HTN, presenting to the ED with dysuria for the past week.  Difficulty urinating beginning this morning.  Denies fever/chills, abdominal pain, hematuria, flank/back pain, N/V/D, chest pain, shortness of breath, vaginal bleeding, abnormal vaginal discharge, or any other complaints.  HIV quant 26 and CD4 count 84, both on November 08, 2019.   Past Medical History:  Diagnosis Date  . Allergic rhinitis   . Biliary colic   . Cancer ([place])   . Gall bladder disease \  . Hepatitis C   . Herpes   . HIV infection ([place])   . Hyperparathyroidism ([place])   . Hypertension   . Insomnia   . Osteoporosis   . Pneumonia    2010    Patient Active Problem List   Diagnosis Date Noted  . Palliative care encounter 01/27/2020  . Goals of care, counseling/discussion 01/16/2020  . Hemorrhoid 12/16/2019  . Rectal pain 12/16/2019  . Anal cancer ([place]) 07/26/2019  . Rectal mass 07/14/2019  . Constipation 07/14/2019  . Anal fissure 07/14/2019  . Healthcare maintenance 06/30/2018  . Left knee pain 12/30/2017  . Left shoulder pain 12/30/2017  . Closed fracture of right tibial plateau 02/18/2017  . Benign hypertension 02/15/2017  . Screening examination for venereal disease 03/06/2015  . Encounter for long-term (current) use of medications 03/06/2015  . Elevated LFTs 12/11/2014  . Abnormal ultrasound 12/11/2014  . Abdominal pain, epigastric 12/11/2014  . ASCUS of cervix with negative high risk HPV 10/31/2013  . Gall bladder disease 02/03/2013  . 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  . Compression fracture of L3 lumbar vertebra 07/01/2011  . Thyroid  dysfunction 07/01/2011  . Anemia 07/01/2011  . Sore throat 04/02/2011  . Hyperparathyroidism 02/10/2011  . Osteopenia 02/03/2011  . PAP SMEAR, LGSIL, ABNORMAL 12/21/2009  . COUGH 07/18/2009  . Chronic hepatitis C virus infection ([place]) 12/19/2008  . LEUKORRHEA 04/07/2008  . FATIGUE 04/07/2008  . BACK PAIN, LUMBAR 11/10/2007  . ALLERGIC RHINITIS 07/09/2007  . PNEUMOCYSTIS PNEUMONIA 05/07/2007  . Essential hypertension 05/07/2007  . DENTAL CARIES 05/07/2007  . INSOMNIA 05/07/2007  . PNEUMONIA, HX OF 05/07/2007  . HERPES ZOSTER, HX OF 05/07/2007  . Human immunodeficiency virus (HIV) disease ([employer]) 08/04/2006    Past Surgical History:  Procedure Laterality Date  . CHOLECYSTECTOMY N/A 01/09/2015   Procedure: LAPAROSCOPIC CHOLECYSTECTOMY;  Surgeon: [name], MD;  Location: [facility];  Service: General;  Laterality: N/A;  . ECTOPIC PREGNANCY SURGERY    . ESOPHAGOGASTRODUODENOSCOPY (EGD) WITH PROPOFOL N/A 09/08/2019   Procedure: ESOPHAGOGASTRODUODENOSCOPY (EGD) WITH PROPOFOL;  Surgeon: Milus Banister, MD;  Location: [facility];  Service: Endoscopy;  Laterality: N/A;  . IR FLUORO GUIDE CV LINE LEFT  08/15/2019  . ORIF TIBIA PLATEAU Right 02/18/2017   Procedure: OPEN REDUCTION INTERNAL FIXATION (ORIF) RIGHT TIBIAL PLATEAU;  Surgeon: Leandrew Koyanagi, MD;  Location: [facility];  Service: Orthopedics;  Laterality: Right;  . SHOULDER SURGERY Right 12/2014  . UPPER ESOPHAGEAL ENDOSCOPIC ULTRASOUND (EUS) N/A 09/08/2019   Procedure: UPPER ESOPHAGEAL ENDOSCOPIC ULTRASOUND (EUS);  Surgeon: Milus Banister, MD;  Location: [facility];  Service: Endoscopy;  Laterality:  N/A;     OB History    Gravida  5   Para  3   Term  3   Preterm  0   AB  2   Living  2     SAB  0   TAB  1   Ectopic  1   Multiple  0   Live Births              Family History  Problem Relation Age of Onset  . Diabetes Mother   . Hypertension Mother   . Bone cancer Half-Brother   . Prostate cancer  Half-Brother   . Colon cancer Neg Hx   . Colon polyps Neg Hx   . Esophageal cancer Neg Hx   . Pancreatic cancer Neg Hx   . Stomach cancer Neg Hx   . Liver disease Neg Hx     Social History   Tobacco Use  . Smoking status: Never Smoker  . Smokeless tobacco: Never Used  Vaping Use  . Vaping Use: Never used  Substance Use Topics  . Alcohol use: Not Currently    Comment: occasionally   . Drug use: No    Home Medications Prior to Admission medications   Medication Sig Start Date End Date Taking? Authorizing Provider  diphenoxylate-atropine (LOMOTIL) 2.5-0.025 MG tablet Take 1-2 tablets by mouth 4 (four) times daily as needed for diarrhea or loose stools. 12/06/19  Yes Tanner, Lyndon Code., PA-C  hydrocortisone (ANUSOL-HC) 25 MG suppository Place 1 suppository (25 mg total) rectally 2 (two) times daily. Patient taking differently: Place 25 mg rectally 2 (two) times daily as needed for hemorrhoids.  12/26/19  Yes Hayden Pedro, PA-C  hydrocortisone 1 % ointment Apply 1 application topically daily as needed for hemorrhoids. 12/06/19  Yes [provider]  lidocaine (XYLOCAINE) 2 % solution Apply topically to anus as needed for pain relief 12/06/19  Yes Tanner, Lyndon Code., PA-C  oxyCODONE 10 MG TABS Take 1 tablet (10 mg total) by mouth every 6 (six) hours as needed for severe pain. 02/24/20  Yes Acquanetta Chain, DO  potassium chloride SA (KLOR-CON) 20 MEQ tablet Take 1 tablet (20 mEq total) by mouth daily. 02/17/20  Yes Heilingoetter, Cassandra L, PA-C  zolpidem (AMBIEN) 10 MG tablet Take 1 tablet (10 mg total) by mouth at bedtime as needed. Patient taking differently: Take 10 mg by mouth at bedtime as needed for sleep.  07/01/17  Yes Carlyle Basques, MD  AMBULATORY NON FORMULARY MEDICATION Nitroglycerine ointment 0.125 %  Apply a pea sized amount internally four times daily. Patient not taking: Reported on 02/26/2020 08/01/19   Levin Erp, PA  amLODipine (NORVASC) 10 MG  tablet Take 1 tablet (10 mg total) by mouth daily. Patient not taking: Reported on 02/26/2020 08/23/19   Carlyle Basques, MD  Budesonide 2 MG/ACT FOAM Initial: 2 mg (one metered dose) twice daily (morning and evening) for 2 weeks followed by maintenance: 2 mg (one metered dose) once daily (evening) for 4 weeks. Patient not taking: Reported on 02/26/2020 01/13/20   Heilingoetter, Cassandra L, PA-C  cephALEXin (KEFLEX) 500 MG capsule Take 1 capsule (500 mg total) by mouth 2 (two) times daily for 5 days. 02/26/20 03/02/20  Latania Bascomb C, PA-C  DULoxetine (CYMBALTA) 20 MG capsule Take 1 capsule (20 mg total) by mouth daily. Patient not taking: Reported on 02/26/2020 12/16/19   Heilingoetter, Cassandra L, PA-C  magnesium citrate SOLN Take 148-296 mLs (0.5-1 Bottles total) by mouth as needed  for moderate constipation or severe constipation. Patient not taking: Reported on 02/26/2020 02/06/20   Acquanetta Chain, DO  senna-docusate (SENOKOT-S) 8.6-50 MG tablet Take 2 tablets by mouth 2 (two) times daily. Patient not taking: Reported on 02/26/2020 01/27/20   Lane Hacker L, DO  sucralfate (CARAFATE) 1 g tablet TAKE 2 TABLETS AND MIX IN 4.5ML OF WATER AND PLACE IN ENEMA APPLICATOR USE TWICE DAILY Patient not taking: Reported on 02/26/2020 02/13/20   Heilingoetter, Cassandra L, PA-C  TRIUMEQ 600-50-300 MG tablet TAKE 1 TABLET BY MOUTH DAILY Patient not taking: Reported on 02/26/2020 12/08/19   Carlyle Basques, MD  valACYclovir (VALTREX) 1000 MG tablet Take 1 tablet (1,000 mg total) by mouth 3 (three) times daily. Patient not taking: Reported on 02/26/2020 11/08/19   Carlyle Basques, MD  prochlorperazine (COMPAZINE) 10 MG tablet Take 1 tablet (10 mg total) by mouth every 6 (six) hours as needed (Nausea or vomiting). Patient not taking: Reported on 11/08/2019 07/26/19 01/16/20  Truitt Merle, MD    Allergies    Dapsone, Retrovir [zidovudine], and Sulfamethoxazole-trimethoprim  Review of Systems   Review of  Systems  Constitutional: Negative for chills and fever.  Respiratory: Negative for shortness of breath.   Cardiovascular: Negative for chest pain.  Gastrointestinal: Negative for abdominal pain, diarrhea, nausea and vomiting.  Genitourinary: Positive for difficulty urinating and dysuria. Negative for flank pain, frequency, hematuria, vaginal bleeding and vaginal discharge.  Musculoskeletal: Negative for back pain.  All other systems reviewed and are negative.   Physical Exam Updated Vital Signs BP 104/71 (BP Location: Left Arm)   Pulse 83   Temp 98.4 F (36.9 C) (Oral)   Resp 15   SpO2 100%   Physical Exam Vitals and nursing note reviewed.  Constitutional:      General: She is not in acute distress.    Appearance: She is well-developed. She is not diaphoretic.  HENT:     Head: Normocephalic and atraumatic.     Mouth/Throat:     Mouth: Mucous membranes are moist.     Pharynx: Oropharynx is clear.  Eyes:     Conjunctiva/sclera: Conjunctivae normal.  Cardiovascular:     Rate and Rhythm: Normal rate and regular rhythm.     Pulses: Normal pulses.          Radial pulses are 2+ on the right side and 2+ on the left side.       Posterior tibial pulses are 2+ on the right side and 2+ on the left side.     Heart sounds: Normal heart sounds.     Comments: Tactile temperature in the extremities appropriate and equal bilaterally. Pulmonary:     Effort: Pulmonary effort is normal. No respiratory distress.     Breath sounds: Normal breath sounds.  Abdominal:     Palpations: Abdomen is soft.     Tenderness: There is no abdominal tenderness. There is no guarding.  Musculoskeletal:     Cervical back: Neck supple.     Right lower leg: No edema.     Left lower leg: No edema.  Lymphadenopathy:     Cervical: No cervical adenopathy.  Skin:    General: Skin is warm and dry.  Neurological:     Mental Status: She is alert.  Psychiatric:        Mood and Affect: Mood and affect normal.         Speech: Speech normal.        Behavior: Behavior normal.  ED Results / Procedures / Treatments   Labs (all labs ordered are listed, but only abnormal results are displayed) Labs Reviewed  CBC - Abnormal; Notable for the following components:      Result Value   RBC 3.07 (*)    Hemoglobin 9.7 (*)    HCT 29.4 (*)    RDW 16.0 (*)    All other components within normal limits  COMPREHENSIVE METABOLIC PANEL - Abnormal; Notable for the following components:   Sodium 131 (*)    Chloride 96 (*)    Glucose, Bld 102 (*)    Calcium 10.7 (*)    Albumin 2.6 (*)    All other components within normal limits  URINALYSIS, ROUTINE W REFLEX MICROSCOPIC - Abnormal; Notable for the following components:   Color, Urine AMBER (*)    All other components within normal limits  URINE CULTURE    EKG None  Radiology No results found.  Procedures Procedures (including critical care time)  Medications Ordered in ED Medications - No data to display  ED Course  I have reviewed the triage vital signs and the nursing notes.  Pertinent labs & imaging results that were available during my care of the patient were reviewed by me and considered in my medical decision making (see chart for details).  Clinical Course as of Feb 25 2346  Nancy Fetter Feb 26, 2020  1928 Patient lying on her side. This is not a true reading.  BP(!): 83/56 [SJ]    Clinical Course User Index [SJ] Chaelyn Bunyan, Helane Gunther, PA-C   MDM Rules/Calculators/A&P                          Patient presents with dysuria. Patient is nontoxic appearing, afebrile, not tachycardic, not tachypneic, not hypotensive, maintains excellent SPO2 on room air, and is in no apparent distress.   I have reviewed the patient's chart to obtain more information.   I reviewed and interpreted the patient's labs and radiological studies. Some decrease in patient's hemoglobin, however, no active bleeding.  Close follow-up with her PCP. UA unremarkable.  Urine  culture results pending.  Patient started on antibiotics based on symptoms.  Return precautions discussed.  Patient voices understanding of these instructions, accepts the plan, and is comfortable with discharge.   Findings and plan of care discussed with Lennice Sites, DO.   Final Clinical Impression(s) / ED Diagnoses Final diagnoses:  Dysuria    Rx / DC Orders ED Discharge Orders         Ordered    nitrofurantoin, macrocrystal-monohydrate, (MACROBID) 100 MG capsule  2 times daily,   Status:  Discontinued        02/26/20 1905    cephALEXin (KEFLEX) 500 MG capsule  2 times daily        02/26/20 1906           Layla Maw 02/26/20 2349    Lennice Sites, DO 02/27/20 2158

## 2020-02-27 LAB — URINE CULTURE

## 2020-02-29 ENCOUNTER — Telehealth: Payer: Self-pay

## 2020-02-29 NOTE — Telephone Encounter (Signed)
Vanessa Beard called stating she now has a [place] growth on her rectum.  Originally she says Vanessa Beard had a film on Vanessa Beard now that film is gone and Vanessa Beard is [name].  She states Vanessa Beard is sore and is draining small amount of light pink fluid.  Her rectal bleeding is now only occasional drips .

## 2020-03-01 ENCOUNTER — Telehealth: Payer: Self-pay

## 2020-03-01 ENCOUNTER — Emergency Department (HOSPITAL_COMMUNITY)
Admission: EM | Admit: 2020-03-01 | Discharge: 2020-03-01 | Disposition: A | Discharge status: Home / Self Care | Payer: Medicare HMO | Source: Home / Self Care | Attending: Emergency Medicine | Admitting: Emergency Medicine

## 2020-03-01 ENCOUNTER — Ambulatory Visit: Payer: Medicare HMO | Admitting: Hematology

## 2020-03-01 ENCOUNTER — Emergency Department (HOSPITAL_COMMUNITY): Payer: Medicare HMO

## 2020-03-01 ENCOUNTER — Other Ambulatory Visit: Payer: Self-pay

## 2020-03-01 ENCOUNTER — Encounter (HOSPITAL_COMMUNITY): Payer: Self-pay | Admitting: *Deleted

## 2020-03-01 ENCOUNTER — Inpatient Hospital Stay (HOSPITAL_BASED_OUTPATIENT_CLINIC_OR_DEPARTMENT_OTHER): Payer: Medicare HMO | Admitting: Hematology

## 2020-03-01 ENCOUNTER — Ambulatory Visit: Payer: Medicare HMO | Admitting: Internal Medicine

## 2020-03-01 ENCOUNTER — Encounter: Payer: Self-pay | Admitting: Hematology

## 2020-03-01 VITALS — BP 103/75 | HR 94 | Temp 97.2°F | Resp 18 | Ht 60.0 in

## 2020-03-01 DIAGNOSIS — C20 Malignant neoplasm of rectum: Secondary | ICD-10-CM | POA: Diagnosis not present

## 2020-03-01 DIAGNOSIS — Z79899 Other long term (current) drug therapy: Secondary | ICD-10-CM | POA: Diagnosis not present

## 2020-03-01 DIAGNOSIS — B1920 Unspecified viral hepatitis C without hepatic coma: Secondary | ICD-10-CM | POA: Diagnosis not present

## 2020-03-01 DIAGNOSIS — K6289 Other specified diseases of anus and rectum: Secondary | ICD-10-CM | POA: Diagnosis not present

## 2020-03-01 DIAGNOSIS — R634 Abnormal weight loss: Secondary | ICD-10-CM | POA: Diagnosis not present

## 2020-03-01 DIAGNOSIS — Z881 Allergy status to other antibiotic agents status: Secondary | ICD-10-CM | POA: Diagnosis not present

## 2020-03-01 DIAGNOSIS — C210 Malignant neoplasm of anus, unspecified: Secondary | ICD-10-CM | POA: Insufficient documentation

## 2020-03-01 DIAGNOSIS — I700 Atherosclerosis of aorta: Secondary | ICD-10-CM | POA: Diagnosis not present

## 2020-03-01 DIAGNOSIS — J479 Bronchiectasis, uncomplicated: Secondary | ICD-10-CM | POA: Diagnosis not present

## 2020-03-01 DIAGNOSIS — Z9049 Acquired absence of other specified parts of digestive tract: Secondary | ICD-10-CM | POA: Diagnosis not present

## 2020-03-01 DIAGNOSIS — B20 Human immunodeficiency virus [HIV] disease: Secondary | ICD-10-CM | POA: Diagnosis not present

## 2020-03-01 DIAGNOSIS — Z21 Asymptomatic human immunodeficiency virus [HIV] infection status: Secondary | ICD-10-CM | POA: Insufficient documentation

## 2020-03-01 DIAGNOSIS — C211 Malignant neoplasm of anal canal: Secondary | ICD-10-CM | POA: Diagnosis not present

## 2020-03-01 DIAGNOSIS — R39198 Other difficulties with micturition: Secondary | ICD-10-CM | POA: Insufficient documentation

## 2020-03-01 DIAGNOSIS — Z5112 Encounter for antineoplastic immunotherapy: Secondary | ICD-10-CM | POA: Diagnosis not present

## 2020-03-01 DIAGNOSIS — I10 Essential (primary) hypertension: Secondary | ICD-10-CM | POA: Insufficient documentation

## 2020-03-01 DIAGNOSIS — K429 Umbilical hernia without obstruction or gangrene: Secondary | ICD-10-CM | POA: Diagnosis not present

## 2020-03-01 DIAGNOSIS — K629 Disease of anus and rectum, unspecified: Secondary | ICD-10-CM

## 2020-03-01 DIAGNOSIS — Z923 Personal history of irradiation: Secondary | ICD-10-CM | POA: Diagnosis not present

## 2020-03-01 DIAGNOSIS — Z9221 Personal history of antineoplastic chemotherapy: Secondary | ICD-10-CM | POA: Diagnosis not present

## 2020-03-01 DIAGNOSIS — D259 Leiomyoma of uterus, unspecified: Secondary | ICD-10-CM | POA: Diagnosis not present

## 2020-03-01 DIAGNOSIS — D123 Benign neoplasm of transverse colon: Secondary | ICD-10-CM | POA: Diagnosis not present

## 2020-03-01 DIAGNOSIS — E876 Hypokalemia: Secondary | ICD-10-CM | POA: Diagnosis not present

## 2020-03-01 DIAGNOSIS — M810 Age-related osteoporosis without current pathological fracture: Secondary | ICD-10-CM | POA: Diagnosis not present

## 2020-03-01 DIAGNOSIS — K5730 Diverticulosis of large intestine without perforation or abscess without bleeding: Secondary | ICD-10-CM | POA: Diagnosis not present

## 2020-03-01 DIAGNOSIS — E213 Hyperparathyroidism, unspecified: Secondary | ICD-10-CM | POA: Diagnosis not present

## 2020-03-01 DIAGNOSIS — C189 Malignant neoplasm of colon, unspecified: Secondary | ICD-10-CM | POA: Diagnosis not present

## 2020-03-01 DIAGNOSIS — J439 Emphysema, unspecified: Secondary | ICD-10-CM | POA: Diagnosis not present

## 2020-03-01 LAB — CBC
HCT: 28.3 % — ABNORMAL LOW (ref 36.0–46.0)
Hemoglobin: 9.3 g/dL — ABNORMAL LOW (ref 12.0–15.0)
MCH: 31.4 pg (ref 26.0–34.0)
MCHC: 32.9 g/dL (ref 30.0–36.0)
MCV: 95.6 fL (ref 80.0–100.0)
Platelets: 227 10*3/uL (ref 150–400)
RBC: 2.96 MIL/uL — ABNORMAL LOW (ref 3.87–5.11)
RDW: 16.2 % — ABNORMAL HIGH (ref 11.5–15.5)
WBC: 7.7 10*3/uL (ref 4.0–10.5)
nRBC: 0 % (ref 0.0–0.2)

## 2020-03-01 LAB — COMPREHENSIVE METABOLIC PANEL
ALT: 11 U/L (ref 0–44)
AST: 24 U/L (ref 15–41)
Albumin: 2.5 g/dL — ABNORMAL LOW (ref 3.5–5.0)
Alkaline Phosphatase: 79 U/L (ref 38–126)
Anion gap: 10 (ref 5–15)
BUN: 12 mg/dL (ref 8–23)
CO2: 25 mmol/L (ref 22–32)
Calcium: 10 mg/dL (ref 8.9–10.3)
Chloride: 96 mmol/L — ABNORMAL LOW (ref 98–111)
Creatinine, Ser: 0.75 mg/dL (ref 0.44–1.00)
GFR, Estimated: >60 mL/min (ref >60)
Glucose, Bld: 98 mg/dL (ref 70–99)
Potassium: 3.9 mmol/L (ref 3.5–5.1)
Sodium: 131 mmol/L — ABNORMAL LOW (ref 135–145)
Total Bilirubin: 1.1 mg/dL (ref 0.3–1.2)
Total Protein: 7.4 g/dL (ref 6.5–8.1)

## 2020-03-01 LAB — URINALYSIS, ROUTINE W REFLEX MICROSCOPIC
Bilirubin Urine: NEGATIVE
Glucose, UA: NEGATIVE mg/dL
Ketones, ur: NEGATIVE mg/dL
Nitrite: NEGATIVE
Protein, ur: NEGATIVE mg/dL
Specific Gravity, Urine: 1.035 — ABNORMAL HIGH (ref 1.005–1.030)
WBC, UA: >50 WBC/hpf — ABNORMAL HIGH (ref 0–5)
pH: 6 (ref 5.0–8.0)

## 2020-03-01 MED ORDER — HYDROMORPHONE HCL 1 MG/ML IJ SOLN
0.5000 mg | Freq: Once | INTRAMUSCULAR | Status: AC
  Administered 2020-03-01: 0.5 mg via INTRAVENOUS
  Filled 2020-03-01: qty 1

## 2020-03-01 MED ORDER — IOHEXOL 300 MG/ML  SOLN
100.0000 mL | Freq: Once | INTRAMUSCULAR | Status: AC | PRN
  Administered 2020-03-01: 100 mL via INTRAVENOUS

## 2020-03-01 MED ORDER — SODIUM CHLORIDE (PF) 0.9 % IJ SOLN
INTRAMUSCULAR | Status: AC
  Filled 2020-03-01: qty 50

## 2020-03-01 NOTE — ED Notes (Signed)
Transporting pt to cancer center

## 2020-03-01 NOTE — ED Provider Notes (Signed)
[facility] Emergency Department Provider Note MRN:  [id number]  Arrival date & time: 03/01/20     Chief Complaint   Rectal Pain (Anal Cancer)   History of Present Illness   Vanessa Beard is a 69 y.o. year-old female with a history of HIV, anal cancer presenting to the ED with chief complaint of rectal pain.  Worsening pain to the anus associated with a mass or nodule worsening over the past 2 weeks.  More recently having issues urinating.  No issues with bowel movements but considerable pain with any effort to have a bowel movement or wiping.  Some bleeding from the site.  Denies fever, no vomiting, no chest pain, shortness of breath, no abdominal pain.  Review of Systems  A complete 10 system review of systems was obtained and all systems are negative except as noted in the HPI and PMH.   Patient's Health History    Past Medical History:  Diagnosis Date  . Allergic rhinitis   . Biliary colic   . Cancer ([place])   . Gall bladder disease \  . Hepatitis C   . Herpes   . HIV infection ([place])   . Hyperparathyroidism ([place])   . Hypertension   . Insomnia   . Osteoporosis   . Pneumonia    2010    Past Surgical History:  Procedure Laterality Date  . CHOLECYSTECTOMY N/A 01/09/2015   Procedure: LAPAROSCOPIC CHOLECYSTECTOMY;  Surgeon: [name], MD;  Location: [place];  Service: General;  Laterality: N/A;  . ECTOPIC PREGNANCY SURGERY    . ESOPHAGOGASTRODUODENOSCOPY (EGD) WITH PROPOFOL N/A 09/08/2019   Procedure: ESOPHAGOGASTRODUODENOSCOPY (EGD) WITH PROPOFOL;  Surgeon: Milus Banister, MD;  Location: [facility];  Service: Endoscopy;  Laterality: N/A;  . IR FLUORO GUIDE CV LINE LEFT  08/15/2019  . ORIF TIBIA PLATEAU Right 02/18/2017   Procedure: OPEN REDUCTION INTERNAL FIXATION (ORIF) RIGHT TIBIAL PLATEAU;  Surgeon: Leandrew Koyanagi, MD;  Location: [place];  Service: Orthopedics;  Laterality: Right;  . SHOULDER SURGERY Right 12/2014  . UPPER ESOPHAGEAL  ENDOSCOPIC ULTRASOUND (EUS) N/A 09/08/2019   Procedure: UPPER ESOPHAGEAL ENDOSCOPIC ULTRASOUND (EUS);  Surgeon: Milus Banister, MD;  Location: [facility];  Service: Endoscopy;  Laterality: N/A;    Family History  Problem Relation Age of Onset  . Diabetes Mother   . Hypertension Mother   . Bone cancer Half-Brother   . Prostate cancer Half-Brother   . Colon cancer Neg Hx   . Colon polyps Neg Hx   . Esophageal cancer Neg Hx   . Pancreatic cancer Neg Hx   . Stomach cancer Neg Hx   . Liver disease Neg Hx     Social History   Socioeconomic History  . Marital status: Divorced    Spouse name: Not on file  . Number of children: 2  . Years of education: Not on file  . Highest education level: Not on file  Occupational History  . Occupation: CNA  Tobacco Use  . Smoking status: Never Smoker  . Smokeless tobacco: Never Used  Vaping Use  . Vaping Use: Never used  Substance and Sexual Activity  . Alcohol use: Not Currently    Comment: occasionally   . Drug use: No  . Sexual activity: Yes    Partners: Male    Birth control/protection: Condom    Comment: declined condoms  Other Topics Concern  . Not on file  Social History Narrative  . Not on file   Social Determinants  of Health   Financial Resource Strain:   . Difficulty of Paying Living Expenses: Not on file  Food Insecurity:   . Worried About Charity fundraiser in the Last Year: Not on file  . Ran Out of Food in the Last Year: Not on file  Transportation Needs: Unmet Transportation Needs  . Lack of Transportation (Medical): Yes  . Lack of Transportation (Non-Medical): Yes  Physical Activity:   . Days of Exercise per Week: Not on file  . Minutes of Exercise per Session: Not on file  Stress:   . Feeling of Stress : Not on file  Social Connections:   . Frequency of Communication with Friends and Family: Not on file  . Frequency of Social Gatherings with Friends and Family: Not on file  . Attends Religious Services:  Not on file  . Active Member of Clubs or Organizations: Not on file  . Attends Archivist Meetings: Not on file  . Marital Status: Not on file  Intimate Partner Violence:   . Fear of Current or Ex-Partner: Not on file  . Emotionally Abused: Not on file  . Physically Abused: Not on file  . Sexually Abused: Not on file     Physical Exam   Vitals:   03/01/20 1304 03/01/20 1506  BP: 103/70 101/74  Pulse: 66 76  Resp: 18 11  Temp:    SpO2: 92% 92%    CONSTITUTIONAL: Chronically ill-appearing, NAD NEURO:  Alert and oriented x 3, no focal deficits EYES:  eyes equal and reactive ENT/NECK:  no LAD, no JVD CARDIO: Regular rate, well-perfused, normal S1 and S2 PULM:  CTAB no wheezing or rhonchi GI/GU:  normal bowel sounds, non-distended, non-tender; erythematous and tender nodule at the 12 o'clock position of the anus, 1 to 2 cm in size MSK/SPINE:  No gross deformities, no edema SKIN:  no rash, atraumatic PSYCH:  Appropriate speech and behavior  *Additional and/or pertinent findings included in MDM below  Diagnostic and Interventional Summary    EKG Interpretation  Date/Time:    Ventricular Rate:    PR Interval:    QRS Duration:   QT Interval:    QTC Calculation:   R Axis:     Text Interpretation:        Labs Reviewed  CBC - Abnormal; Notable for the following components:      Result Value   RBC 2.96 (*)    Hemoglobin 9.3 (*)    HCT 28.3 (*)    RDW 16.2 (*)    All other components within normal limits  COMPREHENSIVE METABOLIC PANEL - Abnormal; Notable for the following components:   Sodium 131 (*)    Chloride 96 (*)    Albumin 2.5 (*)    All other components within normal limits  URINALYSIS, ROUTINE W REFLEX MICROSCOPIC - Abnormal; Notable for the following components:   APPearance HAZY (*)    Specific Gravity, Urine 1.035 (*)    Hgb urine dipstick MODERATE (*)    Leukocytes,Ua LARGE (*)    WBC, UA >50 (*)    Bacteria, UA RARE (*)    All other  components within normal limits    CT ABDOMEN PELVIS W CONTRAST  Final Result      Medications  sodium chloride (PF) 0.9 % injection (has no administration in time range)  HYDROmorphone (DILAUDID) injection 0.5 mg (0.5 mg Intravenous Given 03/01/20 1206)  iohexol (OMNIPAQUE) 300 MG/ML solution 100 mL (100 mLs Intravenous Contrast Given 03/01/20 1324)  Procedures  /  Critical Care Procedures  ED Course and Medical Decision Making  I have reviewed the triage vital signs, the nursing notes, and pertinent available records from the EMR.  Listed above are laboratory and imaging tests that I personally ordered, reviewed, and interpreted and then considered in my medical decision making (see below for details).  Concern for tumor versus thrombosed hemorrhoid versus perianal abscess, will obtain CT for further evaluation.     CT reveals likely necrotic anorectal mass.  Patient's pain is well controlled, she has normal vital signs, no fever, labs are reassuring, no leukocytosis, no signs of systemic illness.  I discussed the case with her oncologist Dr. Burr Medico, who feels patient is appropriate for close outpatient follow-up with herself and with her surgeon Dr. Ria Bush.  Dr. Burr Medico would like to have patient discharged directly to her office for a examination, and patient is happy with this discharge plan.  Sending urine for culture, no dysuria, [place] cells may be reactionary to the given urogenital issue.  Barth Kirks. [place] Small, [place] [email or web address]  Final Clinical Impressions(s) / ED Diagnoses     ICD-10-CM   1. Anal lesion  K62.9   2. Anal pain  K62.89     ED Discharge Orders    None       Discharge Instructions Discussed with and Provided to Patient:     Discharge Instructions     You were evaluated in the Emergency Department and after careful evaluation, we did not find any emergent condition requiring admission or further  testing in the hospital.  Your pain seems to be due to an inflamed anal mass.  We are discharging you directly to Dr. Ernestina Penna oncology clinic so that she can examine you and set up appropriate follow-up.  You can use your home oxycodone pain medication as needed.  Please return to the Emergency Department if you experience any worsening of your condition.  Thank you for allowing [place] to be a part of your care.        Maudie Flakes, MD 03/01/20 1510

## 2020-03-01 NOTE — ED Notes (Signed)
Bladder scan was performed. Pt bladder did not show any volume above 15ml.

## 2020-03-01 NOTE — Discharge Instructions (Addendum)
You were evaluated in the Emergency Department and after careful evaluation, we did not find any emergent condition requiring admission or further testing in the hospital.  Your pain seems to be due to an inflamed anal mass.  We are discharging you directly to Dr. Ernestina Penna oncology clinic so that she can examine you and set up appropriate follow-up.  You can use your home oxycodone pain medication as needed.  Please return to the Emergency Department if you experience any worsening of your condition.  Thank you for allowing [place] to be a part of your care.

## 2020-03-01 NOTE — Telephone Encounter (Signed)
Vanessa Beard left vm stating the [place] growth on her rectum is now bleeding.  appt made today for dr Burr Medico.  I left vm with this information.

## 2020-03-01 NOTE — Progress Notes (Signed)
[place]   [phone]) [phone] [phone]) [phone]   Clinic Follow up Note   Patient Care Team: Seward Carol, MD as PCP - General (Internal Medicine) Carlyle Basques, MD as PCP - Infectious Diseases (Infectious Diseases) Binnie Rail, DC as Referring Physician (Chiropractic Medicine) Jonnie Finner, RN as Oncology Nurse Navigator  Date of Service:  03/01/2020  CHIEF COMPLAINT: rectal pain   SUMMARY OF ONCOLOGIC HISTORY: Oncology History Overview Note  Cancer Staging Anal cancer [facility]) Staging form: Anus, AJCC 8th Edition - Clinical: Stage IV (cT2, cN0, cM1) - Signed by Truitt Merle, MD on 08/12/2019    Anal cancer ([place])  07/19/2019 Procedure   Colonoscopy by Dr Havery Moros 07/19/19  IMPRESSION - Ulcer noted at the anal canal in posterior midline canal that extends into the distal rectum on digital rectal exam, nodular and somewhat hard to palpation. Endoscopic images show nodular tissue in the area, concerning for malignant ulcer. - The examined portion of the ileum was normal. - One 3 mm polyp at the hepatic flexure, removed with a cold snare. Resected and retrieved. - One 5 mm polyp in the transverse colon, removed with a cold snare. Resected and retrieved. - Diverticulosis in the ascending colon. - The examination was otherwise normal.   07/19/2019 Initial Biopsy   Diagnosis 07/19/19 1. Transverse Colon Polyp, hepatic flexure (2) - TUBULAR ADENOMA (1 OF 3 FRAGMENTS) - BENIGN COLONIC MUCOSA (2 OF 3 FRAGMENTS) - NO HIGH GRADE DYSPLASIA OR MALIGNANCY IDENTIFIED 2. Rectum, biopsy, distal rectal anal canal - SQUAMOUS CELL CARCINOMA - SEE COMMENT   07/26/2019 Initial Diagnosis   Anal cancer ([place])   07/26/2019 Cancer Staging   Staging form: Anus, AJCC 8th Edition - Clinical: Stage IV (cT2, cN1c, cM1) - Signed by Truitt Merle, MD on 08/12/2019   08/04/2019 PET scan   IMPRESSION: Hypermetabolic anal soft tissue mass, consistent with known primary anal  carcinoma.   Sub-cm hypermetabolic lymph nodes in posterior perirectal space, right inguinal region, right iliac chain, and gastrohepatic ligament, suspicious for metastatic disease.   Small hypermetabolic left axillary lymph nodes. This would be unusual location for metastatic anal carcinoma. Recommend clinical correlation for possibility the patient has had recent COVID vaccination in the left arm, which could explain this finding.   Multifocal airspace disease in both lower lungs with marked hypermetabolic activity. This favors infectious or inflammatory etiology, and is not typical for pulmonary metastases. Short-term follow-up by chest CT is recommended.   08/15/2019 - 10/05/2019 Radiation Therapy   Concurrent ChemoRT with Dr. Lisbeth Renshaw starting 08/15/19-10/05/19   08/15/2019 - 09/12/2019 Chemotherapy   Concurrent ChemoRT with Mitomycin and 5FU on week 1 and 5 starting 08/15/19 with week 5 dose on 09/12/19.    11/02/2019 Imaging   CT CAP w contrast  IMPRESSION: No acute findings in the chest, abdomen or pelvis.   Biapical and bibasilar scarring. Bronchiectasis and paraseptal emphysema in the lung bases.   Tortuous aorta.   Small umbilical hernia containing fat.   01/05/2020 Imaging   PET SCAN IMPRESSION: 1. Partial metabolic response. Persistent anal mass hypermetabolism, decreased. No residual hypermetabolic nodal metastases in the abdomen or pelvis. Left axillary hypermetabolic lymph nodes are decreased in size and metabolism. No new or progressive hypermetabolic metastatic disease. 2. Hypermetabolism associated with patchy consolidative airspace opacities in the mid to lower lungs bilaterally, decreased in extent and metabolism, favoring resolving inflammatory opacities. 3. Aortic Atherosclerosis (ICD10-I70.0) and Emphysema (ICD10-J43.9).   01/27/2020 -  Chemotherapy   The patient had  pembrolizumab (KEYTRUDA) 200 mg in sodium chloride 0.9 % 50 mL chemo infusion, 200 mg,  Intravenous, Once, 2 of 6 cycles Administration: 200 mg (01/27/2020), 200 mg (02/17/2020)  for chemotherapy treatment.       CURRENT THERAPY:  Keytruda every 3 weeks starting 01/27/20  INTERVAL HISTORY:  JULIONA VALES was evaluated in emergency room today for worsening rectal pain.  I discussed her case with ED physician, and decided to see her in my office after ED discharge.  She has received 2 doses of Keytruda so far, tolerating well.  However her rectal pain has been getting worse daily, she is very uncomfortable when sits, overall quite fatigued, has not been able to do much at home besides resting.  Appetite is low, she has been losing weight.  No fever, chills.  Rectal discharge she is light yellow, small amount.  No hematochezia.  Review of system otherwise negative.  MEDICAL HISTORY:  Past Medical History:  Diagnosis Date  . Allergic rhinitis   . Biliary colic   . Cancer ([place])   . Gall bladder disease \  . Hepatitis C   . Herpes   . HIV infection ([place])   . Hyperparathyroidism ([place])   . Hypertension   . Insomnia   . Osteoporosis   . Pneumonia    2010    SURGICAL HISTORY: Past Surgical History:  Procedure Laterality Date  . CHOLECYSTECTOMY N/A 01/09/2015   Procedure: LAPAROSCOPIC CHOLECYSTECTOMY;  Surgeon: [name], MD;  Location: [place];  Service: General;  Laterality: N/A;  . ECTOPIC PREGNANCY SURGERY    . ESOPHAGOGASTRODUODENOSCOPY (EGD) WITH PROPOFOL N/A 09/08/2019   Procedure: ESOPHAGOGASTRODUODENOSCOPY (EGD) WITH PROPOFOL;  Surgeon: Milus Banister, MD;  Location: [facility];  Service: Endoscopy;  Laterality: N/A;  . IR FLUORO GUIDE CV LINE LEFT  08/15/2019  . ORIF TIBIA PLATEAU Right 02/18/2017   Procedure: OPEN REDUCTION INTERNAL FIXATION (ORIF) RIGHT TIBIAL PLATEAU;  Surgeon: Leandrew Koyanagi, MD;  Location: [place];  Service: Orthopedics;  Laterality: Right;  . SHOULDER SURGERY Right 12/2014  . UPPER ESOPHAGEAL ENDOSCOPIC ULTRASOUND (EUS) N/A 09/08/2019    Procedure: UPPER ESOPHAGEAL ENDOSCOPIC ULTRASOUND (EUS);  Surgeon: Milus Banister, MD;  Location: [facility];  Service: Endoscopy;  Laterality: N/A;    I have reviewed the social history and family history with the patient and they are unchanged from previous note.  ALLERGIES:  is allergic to dapsone, retrovir [zidovudine], and sulfamethoxazole-trimethoprim.  MEDICATIONS:  Current Outpatient Medications  Medication Sig Dispense Refill  . AMBULATORY NON FORMULARY MEDICATION Nitroglycerine ointment 0.125 %  Apply a pea sized amount internally four times daily. (Patient not taking: Reported on 02/26/2020) 30 g 1  . amLODipine (NORVASC) 10 MG tablet Take 1 tablet (10 mg total) by mouth daily. 90 tablet 3  . Budesonide 2 MG/ACT FOAM Initial: 2 mg (one metered dose) twice daily (morning and evening) for 2 weeks followed by maintenance: 2 mg (one metered dose) once daily (evening) for 4 weeks. (Patient not taking: Reported on 03/01/2020) 33.4 g 0  . cephALEXin (KEFLEX) 500 MG capsule Take 1 capsule (500 mg total) by mouth 2 (two) times daily for 5 days. (Patient taking differently: Take 500 mg by mouth 2 (two) times daily. Start date : 02/27/20) 10 capsule 0  . diphenoxylate-atropine (LOMOTIL) 2.5-0.025 MG tablet Take 1-2 tablets by mouth 4 (four) times daily as needed for diarrhea or loose stools. (Patient not taking: Reported on 03/01/2020) 30 tablet 1  . DULoxetine (CYMBALTA) 20 MG  capsule Take 1 capsule (20 mg total) by mouth daily. (Patient not taking: Reported on 02/26/2020) 30 capsule 2  . gabapentin (NEURONTIN) 300 MG capsule Take 300 mg by mouth daily.    . hydrocortisone (ANUSOL-HC) 25 MG suppository Place 1 suppository (25 mg total) rectally 2 (two) times daily. (Patient taking differently: Place 25 mg rectally 2 (two) times daily as needed for hemorrhoids. ) 24 suppository 1  . lidocaine (XYLOCAINE) 2 % solution Apply topically to anus as needed for pain relief (Patient not taking:  Reported on 03/01/2020) 100 mL 1  . magnesium citrate SOLN Take 148-296 mLs (0.5-1 Bottles total) by mouth as needed for moderate constipation or severe constipation. (Patient not taking: Reported on 02/26/2020) 296 mL 2  . methadone (DOLOPHINE) 5 MG tablet Take 5 mg by mouth in the morning and at bedtime.    . nitrofurantoin, macrocrystal-monohydrate, (MACROBID) 100 MG capsule Take 100 mg by mouth 2 (two) times daily. Start date: 02/27/20    . oxyCODONE 10 MG TABS Take 1 tablet (10 mg total) by mouth every 6 (six) hours as needed for severe pain. (Patient taking differently: Take 10 mg by mouth every 6 (six) hours as needed (pain). ) 120 tablet 0  . potassium chloride SA (KLOR-CON) 20 MEQ tablet Take 1 tablet (20 mEq total) by mouth daily. 6 tablet 0  . senna-docusate (SENOKOT-S) 8.6-50 MG tablet Take 2 tablets by mouth 2 (two) times daily. (Patient not taking: Reported on 02/26/2020) 120 tablet 3  . sucralfate (CARAFATE) 1 g tablet TAKE 2 TABLETS AND MIX IN 4.5ML OF WATER AND PLACE IN ENEMA APPLICATOR USE TWICE DAILY (Patient not taking: Reported on 02/26/2020) 180 tablet 0  . TRIUMEQ 600-50-300 MG tablet TAKE 1 TABLET BY MOUTH DAILY (Patient taking differently: Take 1 tablet by mouth daily. ) 30 tablet 3  . valACYclovir (VALTREX) 1000 MG tablet Take 1 tablet (1,000 mg total) by mouth 3 (three) times daily. (Patient not taking: Reported on 02/26/2020) 30 tablet 1  . zolpidem (AMBIEN) 10 MG tablet Take 1 tablet (10 mg total) by mouth at bedtime as needed. (Patient taking differently: Take 10 mg by mouth at bedtime as needed for sleep. ) 30 tablet 1   No current facility-administered medications for this visit.    PHYSICAL EXAMINATION: ECOG PERFORMANCE STATUS: 3  Vitals:   03/01/20 0335  BP: 103/75  Pulse: 94  Resp: 18  Temp: (!) 97.2 F (36.2 C)  SpO2: 98%   Filed Weights    GENERAL:alert, mildly distressed due to rectal pain, came in a wheelchair  SKIN: skin color, texture, turgor  are normal, no rashes or significant lesions EYES: normal, Conjunctiva are pink and non-injected, sclera clear NECK: supple, thyroid normal size, non-tender, without nodularity LUNGS: clear to auscultation and percussion with normal breathing effort HEART: regular rate & rhythm and no murmurs and no lower extremity edema ABDOMEN:abdomen soft, non-tender and normal bowel sounds Musculoskeletal:no cyanosis of digits and no clubbing  NEURO: alert & oriented x 3 with fluent speech, no focal motor/sensory deficits Rectal exam: there is a ulcerated mass in the anal verge, mainly on the right side, with light yellow discharge, tender.  Minimal surrounding skin erythema with mild tenderness. No palpable pararectal mass or fistular. Rectal exam not performed due to pain   LABORATORY DATA:  I have reviewed the data as listed CBC Latest Ref Rng & Units 03/01/2020 02/26/2020 02/17/2020  WBC 4.0 - 10.5 K/uL 7.7 7.6 7.1  Hemoglobin 12.0 - 15.0  g/dL 9.3(L) 9.7(L) 10.9(L)  Hematocrit 36 - 46 % 28.3(L) 29.4(L) 33.4(L)  Platelets 150 - 400 K/uL 227 206 193     CMP Latest Ref Rng & Units 03/01/2020 02/26/2020 02/17/2020  Glucose 70 - 99 mg/dL 98 102(H) 130(H)  BUN 8 - 23 mg/dL _0 Creatinine 0.44 - 1.00 mg/dL 0.75 0.73 0.82  Sodium 135 - 145 mmol/L 131(L) 131(L) 134(L)  Potassium 3.5 - 5.1 mmol/L 3.9 4.5 3.3(L)  Chloride 98 - 111 mmol/L 96(L) 96(L) 102  CO2 22 - 32 mmol/L _1 Calcium 8.9 - 10.3 mg/dL 10.0 10.7(H) 9.6  Total Protein 6.5 - 8.1 g/dL 7.4 7.6 7.3  Total Bilirubin 0.3 - 1.2 mg/dL 1.1 1.0 0.7  Alkaline Phos 38 - 126 U/L 79 81 92  AST 15 - 41 U/L _2 ALT 0 - 44 U/L _3 RADIOGRAPHIC STUDIES: I have personally reviewed the radiological images as listed and agreed with the findings in the report. CT ABDOMEN PELVIS W CONTRAST  Result Date: 03/01/2020 CLINICAL DATA:  Colorectal cancer, new anal mass, difficulty voiding EXAM: CT ABDOMEN AND PELVIS WITH CONTRAST  TECHNIQUE: Multidetector CT imaging of the abdomen and pelvis was performed using the standard protocol following bolus administration of intravenous contrast. CONTRAST:  190m OMNIPAQUE IOHEXOL 300 MG/ML  SOLN COMPARISON:  11/02/2019 FINDINGS: Lower chest: No acute abnormality. Fibrotic scarring of the included bilateral lung bases. Hepatobiliary: No focal liver abnormality is seen. Status post cholecystectomy. Postoperative biliary dilatation. Pancreas: Unremarkable. No pancreatic ductal dilatation or surrounding inflammatory changes. Spleen: Normal in size without significant abnormality. Adrenals/Urinary Tract: Adrenal glands are unremarkable. Kidneys are normal, without renal calculi, solid lesion, or hydronephrosis. Bladder is unremarkable. Stomach/Bowel: Stomach is within normal limits. Appendix appears normal. There is an irregular, rim enhancing soft tissue mass about the anus, containing air and fluid, measuring overall approximately 5.4 x 2.4 cm (series 2, image 76). No significant change in perirectal and presacral soft tissue stranding (series 2, image 67). Vascular/Lymphatic: Scattered aortic atherosclerosis. No enlarged abdominal or pelvic lymph nodes. Reproductive: Small calcified uterine fibroids. Other: No abdominal wall hernia or abnormality. No abdominopelvic ascites. Musculoskeletal: No acute or significant osseous findings. Unchanged superior endplate deformities of T12 and L4. IMPRESSION: 1. There is an irregular, rim enhancing soft tissue mass about the anus, containing air and fluid, measuring overall approximately 5.4 x 2.4 cm. Findings most suggestive of necrotic anorectal mass. Correlate with physical exam findings. 2. Unchanged fat stranding of the perirectal and presacral soft tissues, in keeping with prior radiation therapy. 3. No evidence of lymphadenopathy or metastatic disease in the abdomen or pelvis. 4. Aortic Atherosclerosis (ICD10-I70.0). Electronically Signed   By: AEddie Candle M.D.   On: 03/01/2020 13:57     ASSESSMENT & PLAN:  BDENAYA HORNis a 69y.o. 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 Her biopsy showed Squamous Cell Carcinoma.This is consistent with anal cancer. She has HIV, which is a high risk for anal cancer. -HerPET from 08/04/19 showedhypermetabolicanal mass, hypermetabolicLNsin posterior perirectal space, right inguinal region, right iliac chain and gastrohepatic ligament suspicious for metastasis. Scan also showed hypermetabolic left axillary LNs, which is unusual metastatic pattern. Shedenies recent injury or injections in the left arm. -She completed concurrent chemoradiation -Posttreatment  PET scan showed residual hypermetabolic primary tumor, and hypermetabolic left axillary lymph node, no other metastasis on the PET scan. -I discussed her recent left axillary lymph node biopsy, which was benign. -she was seen by Dr. Dema Severin lately, surgery was offered, patient declined. -She started immunotherapy Keytruda, status post 2 treatments -Due to worsening rectal pain, she was evaluated in ED today.  I reviewed her CT scan from today, which showed an irregular rim enhancing soft tissue mass at the anal canal, measuring 5.4 x 2.4 cm with necrosis. Rectal exam also showed a ulcerated mass in the anal verge, this is likely disease progression, less likely abscess (no fever or leukocytosis)  -I discussed the option of changing systemic therapy to chemotherapy, such as FOLFOX, or surgical resection if it's still feasible.  Patient is now open to surgery, would like to have a second opinion at other Institute.  I will refer her to [facility] -She declined systemic chemotherapy -will discuss in tumor board   2. Hep C,  HIV -She underwentsuccessfulHep C treatment 2 years ago ([id number]) -Her HIV was contracted through sex. Her HIV is well controlled. She will continue to f/u ID Dr Graylon Good.Last CD4 count was 386 on 02/07/2019.  3. Comorbidities: HTN, Hyperparathyroidism, Osteoporosis -Continue medications and f/u with PCP.   4. Social Support  -She is divorced, lives alone and has 2 adult children who live in [place]  -She is getting bachelors degree in Criminal Justice through Online schooling, she plans to complete in 10/2019 but may postpone her final classes.  -She is currently working at [employer] as [employer] and works 8-9 hours every day. She notes her work is flexible to take time off if needed. -Social work met with patient today 01/06/20 -She gets transportation assistance from our cancer center  5. Anal pain  -It has been quite severe lately -f/u with Dr. Hilma Favors from palliative medicine -on methadone, oxycodone, cymbalta, neurontin    PLAN: -discussed CT scan and exam finding, concerning for anal cancer local progression  -pt is open to surgery now, will refer her to [facility] per her request  -pt declined second line chemo  -tumor conference discussion  -copy Dr. Hilma Favors for her pain management  -f/u next Friday    No problem-specific Assessment & Plan notes found for this encounter.   No orders of the defined types were placed in this encounter.  All questions were answered. The patient knows to call the clinic with any problems, questions or concerns. No barriers to learning was detected. The total time spent in the appointment was 40 minutes.     Truitt Merle, MD 03/01/2020   I, Joslyn Devon, am acting as scribe for Truitt Merle, MD.   I have reviewed the above documentation for accuracy and completeness, and I agree with the above.

## 2020-03-01 NOTE — ED Triage Notes (Signed)
Pt has rectal cancer, pain has become worse over the last week.

## 2020-03-02 NOTE — Progress Notes (Signed)
Faxed urgent referral to [facility] to fax [phone].  Gave my direct call back number if additional information is needed.

## 2020-03-03 ENCOUNTER — Inpatient Hospital Stay (HOSPITAL_COMMUNITY)
Admission: EM | Admit: 2020-03-03 | Discharge: 2020-03-11 | DRG: 356 | Disposition: A | Discharge status: Home / Self Care | Payer: Medicare HMO | Attending: Internal Medicine | Admitting: Internal Medicine

## 2020-03-03 ENCOUNTER — Encounter (HOSPITAL_COMMUNITY): Payer: Self-pay

## 2020-03-03 ENCOUNTER — Other Ambulatory Visit: Payer: Self-pay

## 2020-03-03 DIAGNOSIS — Z515 Encounter for palliative care: Secondary | ICD-10-CM

## 2020-03-03 DIAGNOSIS — Z8249 Family history of ischemic heart disease and other diseases of the circulatory system: Secondary | ICD-10-CM | POA: Diagnosis not present

## 2020-03-03 DIAGNOSIS — C210 Malignant neoplasm of anus, unspecified: Secondary | ICD-10-CM

## 2020-03-03 DIAGNOSIS — Z6824 Body mass index (BMI) 24.0-24.9, adult: Secondary | ICD-10-CM | POA: Diagnosis not present

## 2020-03-03 DIAGNOSIS — I517 Cardiomegaly: Secondary | ICD-10-CM | POA: Diagnosis not present

## 2020-03-03 DIAGNOSIS — Z923 Personal history of irradiation: Secondary | ICD-10-CM

## 2020-03-03 DIAGNOSIS — Z8619 Personal history of other infectious and parasitic diseases: Secondary | ICD-10-CM | POA: Diagnosis not present

## 2020-03-03 DIAGNOSIS — C218 Malignant neoplasm of overlapping sites of rectum, anus and anal canal: Secondary | ICD-10-CM | POA: Diagnosis not present

## 2020-03-03 DIAGNOSIS — Z66 Do not resuscitate: Secondary | ICD-10-CM | POA: Diagnosis present

## 2020-03-03 DIAGNOSIS — Z95828 Presence of other vascular implants and grafts: Secondary | ICD-10-CM

## 2020-03-03 DIAGNOSIS — Z833 Family history of diabetes mellitus: Secondary | ICD-10-CM | POA: Diagnosis not present

## 2020-03-03 DIAGNOSIS — Z79899 Other long term (current) drug therapy: Secondary | ICD-10-CM

## 2020-03-03 DIAGNOSIS — D649 Anemia, unspecified: Secondary | ICD-10-CM | POA: Diagnosis present

## 2020-03-03 DIAGNOSIS — D500 Iron deficiency anemia secondary to blood loss (chronic): Secondary | ICD-10-CM | POA: Diagnosis not present

## 2020-03-03 DIAGNOSIS — I10 Essential (primary) hypertension: Secondary | ICD-10-CM | POA: Diagnosis not present

## 2020-03-03 DIAGNOSIS — Z79891 Long term (current) use of opiate analgesic: Secondary | ICD-10-CM

## 2020-03-03 DIAGNOSIS — R5381 Other malaise: Secondary | ICD-10-CM | POA: Diagnosis present

## 2020-03-03 DIAGNOSIS — B20 Human immunodeficiency virus [HIV] disease: Secondary | ICD-10-CM | POA: Diagnosis present

## 2020-03-03 DIAGNOSIS — G893 Neoplasm related pain (acute) (chronic): Secondary | ICD-10-CM | POA: Diagnosis present

## 2020-03-03 DIAGNOSIS — E871 Hypo-osmolality and hyponatremia: Secondary | ICD-10-CM | POA: Diagnosis not present

## 2020-03-03 DIAGNOSIS — J9811 Atelectasis: Secondary | ICD-10-CM | POA: Diagnosis not present

## 2020-03-03 DIAGNOSIS — Z602 Problems related to living alone: Secondary | ICD-10-CM | POA: Diagnosis present

## 2020-03-03 DIAGNOSIS — E43 Unspecified severe protein-calorie malnutrition: Secondary | ICD-10-CM | POA: Diagnosis not present

## 2020-03-03 DIAGNOSIS — Z20822 Contact with and (suspected) exposure to covid-19: Secondary | ICD-10-CM | POA: Diagnosis not present

## 2020-03-03 DIAGNOSIS — K6289 Other specified diseases of anus and rectum: Secondary | ICD-10-CM | POA: Diagnosis not present

## 2020-03-03 DIAGNOSIS — Z9221 Personal history of antineoplastic chemotherapy: Secondary | ICD-10-CM | POA: Diagnosis not present

## 2020-03-03 DIAGNOSIS — C44520 Squamous cell carcinoma of anal skin: Secondary | ICD-10-CM | POA: Diagnosis not present

## 2020-03-03 DIAGNOSIS — K922 Gastrointestinal hemorrhage, unspecified: Secondary | ICD-10-CM

## 2020-03-03 DIAGNOSIS — R69 Illness, unspecified: Secondary | ICD-10-CM | POA: Diagnosis not present

## 2020-03-03 DIAGNOSIS — G4700 Insomnia, unspecified: Secondary | ICD-10-CM

## 2020-03-03 LAB — CBC
HCT: 30.7 % — ABNORMAL LOW (ref 36.0–46.0)
Hemoglobin: 9.7 g/dL — ABNORMAL LOW (ref 12.0–15.0)
MCH: 30.6 pg (ref 26.0–34.0)
MCHC: 31.6 g/dL (ref 30.0–36.0)
MCV: 96.8 fL (ref 80.0–100.0)
Platelets: 274 10*3/uL (ref 150–400)
RBC: 3.17 MIL/uL — ABNORMAL LOW (ref 3.87–5.11)
RDW: 16.4 % — ABNORMAL HIGH (ref 11.5–15.5)
WBC: 7.8 10*3/uL (ref 4.0–10.5)
nRBC: 0 % (ref 0.0–0.2)

## 2020-03-03 LAB — LACTIC ACID, PLASMA: Lactic Acid, Venous: 1.8 mmol/L (ref 0.5–1.9)

## 2020-03-03 LAB — URINE CULTURE: Culture: >=100000 — AB

## 2020-03-03 LAB — TYPE AND SCREEN
ABO/RH(D): A POS
Antibody Screen: NEGATIVE

## 2020-03-03 LAB — URINALYSIS, ROUTINE W REFLEX MICROSCOPIC
Bilirubin Urine: NEGATIVE
Glucose, UA: NEGATIVE mg/dL
Ketones, ur: NEGATIVE mg/dL
Nitrite: NEGATIVE
Protein, ur: 30 mg/dL — AB
Specific Gravity, Urine: 1.018 (ref 1.005–1.030)
WBC, UA: >50 WBC/hpf — ABNORMAL HIGH (ref 0–5)
pH: 6 (ref 5.0–8.0)

## 2020-03-03 LAB — COMPREHENSIVE METABOLIC PANEL
ALT: 12 U/L (ref 0–44)
AST: 23 U/L (ref 15–41)
Albumin: 2.6 g/dL — ABNORMAL LOW (ref 3.5–5.0)
Alkaline Phosphatase: 73 U/L (ref 38–126)
Anion gap: 9 (ref 5–15)
BUN: 12 mg/dL (ref 8–23)
CO2: 24 mmol/L (ref 22–32)
Calcium: 10.3 mg/dL (ref 8.9–10.3)
Chloride: 98 mmol/L (ref 98–111)
Creatinine, Ser: 0.76 mg/dL (ref 0.44–1.00)
GFR, Estimated: >60 mL/min (ref >60)
Glucose, Bld: 123 mg/dL — ABNORMAL HIGH (ref 70–99)
Potassium: 4.2 mmol/L (ref 3.5–5.1)
Sodium: 131 mmol/L — ABNORMAL LOW (ref 135–145)
Total Bilirubin: 0.7 mg/dL (ref 0.3–1.2)
Total Protein: 7.5 g/dL (ref 6.5–8.1)

## 2020-03-03 LAB — RESPIRATORY PANEL BY RT PCR (FLU A&B, COVID)
Influenza A by PCR: NEGATIVE
Influenza B by PCR: NEGATIVE
SARS Coronavirus 2 by RT PCR: NEGATIVE

## 2020-03-03 MED ORDER — ONDANSETRON HCL 4 MG/2ML IJ SOLN: 4.0000 mg | Freq: Four times a day (QID) | INTRAMUSCULAR | Status: DC | PRN

## 2020-03-03 MED ORDER — ONDANSETRON HCL 4 MG PO TABS: 4.0000 mg | ORAL_TABLET | Freq: Four times a day (QID) | ORAL | Status: DC | PRN

## 2020-03-03 MED ORDER — OXYCODONE HCL 5 MG PO TABS
10.0000 mg | ORAL_TABLET | Freq: Four times a day (QID) | ORAL | Status: DC | PRN
  Administered 2020-03-03 – 2020-03-04 (×2): 10 mg via ORAL
  Filled 2020-03-03 (×2): qty 2

## 2020-03-03 MED ORDER — GABAPENTIN 300 MG PO CAPS
300.0000 mg | ORAL_CAPSULE | Freq: Two times a day (BID) | ORAL | Status: DC
  Administered 2020-03-03 – 2020-03-06 (×6): 300 mg via ORAL
  Filled 2020-03-03 (×6): qty 1

## 2020-03-03 MED ORDER — LACTATED RINGERS IV SOLN: INTRAVENOUS | Status: AC

## 2020-03-03 MED ORDER — ACETAMINOPHEN 650 MG RE SUPP: 650.0000 mg | Freq: Four times a day (QID) | RECTAL | Status: DC | PRN

## 2020-03-03 MED ORDER — ABACAVIR-DOLUTEGRAVIR-LAMIVUD 600-50-300 MG PO TABS
1.0000 | ORAL_TABLET | Freq: Every day | ORAL | Status: DC
  Administered 2020-03-03 – 2020-03-11 (×9): 1 via ORAL
  Filled 2020-03-03 (×10): qty 1

## 2020-03-03 MED ORDER — MORPHINE SULFATE (PF) 2 MG/ML IV SOLN
2.0000 mg | INTRAVENOUS | Status: DC | PRN
  Administered 2020-03-04: 2 mg via INTRAVENOUS
  Filled 2020-03-03: qty 1

## 2020-03-03 MED ORDER — ACETAMINOPHEN 325 MG PO TABS
650.0000 mg | ORAL_TABLET | Freq: Four times a day (QID) | ORAL | Status: DC | PRN
  Administered 2020-03-09: 650 mg via ORAL
  Filled 2020-03-03: qty 2

## 2020-03-03 MED ORDER — ZOLPIDEM TARTRATE 5 MG PO TABS
5.0000 mg | ORAL_TABLET | Freq: Every evening | ORAL | Status: DC | PRN
  Administered 2020-03-03 – 2020-03-10 (×6): 5 mg via ORAL
  Filled 2020-03-03 (×6): qty 1

## 2020-03-03 MED ORDER — SODIUM CHLORIDE 0.9 % IV BOLUS
500.0000 mL | Freq: Once | INTRAVENOUS | Status: AC
  Administered 2020-03-03: 500 mL via INTRAVENOUS

## 2020-03-03 MED ORDER — DULOXETINE HCL 20 MG PO CPEP
20.0000 mg | ORAL_CAPSULE | Freq: Every day | ORAL | Status: DC
  Administered 2020-03-03 – 2020-03-11 (×9): 20 mg via ORAL
  Filled 2020-03-03 (×10): qty 1

## 2020-03-03 NOTE — H&P (Signed)
History and Physical    Vanessa Beard DDU:[id number] DOB: 1950/11/12 DOA: 03/03/2020  PCP: Seward Carol, MD  Patient coming from: Home  I have personally briefly reviewed patient's old medical records in [place]  Chief Complaint: Rectal pain with bleeding  HPI: Vanessa Beard is a 69 y.o. 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  Patient follows with oncology, Dr. Burr Medico, for management of anal cancer.  She underwent chemoradiation therapy earlier this year and has been on Keytruda infusions since last month.  Patient states she has noticed an enlarging anal lesion which is now causing significant pain.  She has difficulty with bowel movements due to pain and also with urination due to what she feels is obstruction.  She has recently seen bleeding from her rectum as well as passing blood clots.  She was seen in the ED 03/01/2020 at which time CT abdomen/pelvis with contrast showed approximately 5.4 x 2.4 cm necrotic anorectal mass.  She saw her oncologist on follow-up the same day who referred her to the colorectal oncology team at [facility] however returns to the ED due to significantly worsening symptoms.  She says she was previously very anxious about surgery and worried about living with a colostomy for the rest of her life.  She is open to discuss further with our surgery team.  She has not seen any other obvious bleeding.  She denies any chest pain, palpitations, dyspnea, cough, nausea, vomiting, abdominal pain.  ED Course:  Initial vitals showed BP 101/75, pulse 104, RR 16, temp 98.3 Fahrenheit, SPO2 93% on room air.  Labs  show sodium 131, potassium 4.2, bicarb 24, BUN 12, creatinine 0.76, serum glucose 123, LFTs within normal limits, WBC 7.8, hemoglobin 9.7, platelets 274,000, lactic acid 1.8.  Blood cultures were obtained and pending.  Respiratory PCR panel is obtained and pending.  EDP discussed with on-call general surgery, Dr. Brantley Stage, who will consult in a.m. and requested medical admission.  The hospitalist service was consulted to admit for further evaluation and management.  Review of Systems: All systems reviewed and are negative except as documented in history of present illness above.   Past Medical History:  Diagnosis Date  . Allergic rhinitis   . Biliary colic   . Cancer ([place])   . Gall bladder disease \  . Hepatitis C   . Herpes   . HIV infection ([place])   . Hyperparathyroidism ([place])   . Hypertension   . Insomnia   . Osteoporosis   . Pneumonia    2010    Past Surgical History:  Procedure Laterality Date  . CHOLECYSTECTOMY N/A 01/09/2015   Procedure: LAPAROSCOPIC CHOLECYSTECTOMY;  Surgeon: [name], MD;  Location: [place];  Service: General;  Laterality: N/A;  . ECTOPIC PREGNANCY SURGERY    . ESOPHAGOGASTRODUODENOSCOPY (EGD) WITH PROPOFOL N/A 09/08/2019   Procedure: ESOPHAGOGASTRODUODENOSCOPY (EGD) WITH PROPOFOL;  Surgeon: Milus Banister, MD;  Location: [facility];  Service: Endoscopy;  Laterality: N/A;  . IR FLUORO GUIDE CV LINE LEFT  08/15/2019  . ORIF TIBIA PLATEAU Right 02/18/2017   Procedure: OPEN REDUCTION INTERNAL FIXATION (ORIF) RIGHT TIBIAL PLATEAU;  Surgeon: Leandrew Koyanagi, MD;  Location: [place];  Service:  Orthopedics;  Laterality: Right;  . SHOULDER SURGERY Right 12/2014  . UPPER ESOPHAGEAL ENDOSCOPIC ULTRASOUND (EUS) N/A 09/08/2019   Procedure: UPPER ESOPHAGEAL ENDOSCOPIC ULTRASOUND (EUS);  Surgeon: Milus Banister, MD;  Location: [facility];  Service: Endoscopy;  Laterality: N/A;    Social History:  reports that she has never smoked. She has never used smokeless  tobacco. She reports previous alcohol use. She reports that she does not use drugs.  Allergies  Allergen Reactions  . Dapsone Nausea Only    stomach  burning  . Retrovir [Zidovudine] Other (See Comments)    Changed skin color  . Sulfamethoxazole-Trimethoprim Other (See Comments)    Flu like symptoms    Family History  Problem Relation Age of Onset  . Diabetes Mother   . Hypertension Mother   . Bone cancer Half-Brother   . Prostate cancer Half-Brother   . Colon cancer Neg Hx   . Colon polyps Neg Hx   . Esophageal cancer Neg Hx   . Pancreatic cancer Neg Hx   . Stomach cancer Neg Hx   . Liver disease Neg Hx      Prior to Admission medications   Medication Sig Start Date End Date Taking? Authorizing Provider  amLODipine (NORVASC) 10 MG tablet Take 1 tablet (10 mg total) by mouth daily. 08/23/19  Yes Carlyle Basques, MD  DULoxetine (CYMBALTA) 20 MG capsule Take 1 capsule (20 mg total) by mouth daily. 12/16/19  Yes Heilingoetter, Cassandra L, PA-C  gabapentin (NEURONTIN) 300 MG capsule Take 300 mg by mouth 2 (two) times daily.    Yes [provider]  methadone (DOLOPHINE) 5 MG tablet Take 5 mg by mouth every 6 (six) hours.    Yes [provider]  nitrofurantoin, macrocrystal-monohydrate, (MACROBID) 100 MG capsule Take 100 mg by mouth 2 (two) times daily. Start date: 02/27/20 02/27/20  Yes [provider]  oxyCODONE 10 MG TABS Take 1 tablet (10 mg total) by mouth every 6 (six) hours as needed for severe pain. Patient taking differently: Take 10 mg by mouth every 6 (six) hours as needed (pain).  02/24/20  Yes Acquanetta Chain, DO  potassium chloride SA (KLOR-CON) 20 MEQ tablet Take 1 tablet (20 mEq total) by mouth daily. 02/17/20  Yes Heilingoetter, Cassandra L, PA-C  sucralfate (CARAFATE) 1 g tablet TAKE 2 TABLETS AND MIX IN 4.5ML OF WATER AND PLACE IN ENEMA APPLICATOR USE TWICE DAILY Patient taking differently: Take 2 g by mouth daily.  02/13/20  Yes  Heilingoetter, Cassandra L, PA-C  TRIUMEQ 600-50-300 MG tablet TAKE 1 TABLET BY MOUTH DAILY Patient taking differently: Take 1 tablet by mouth daily.  12/08/19  Yes Carlyle Basques, MD  zolpidem (AMBIEN) 10 MG tablet Take 1 tablet (10 mg total) by mouth at bedtime as needed. Patient taking differently: Take 10 mg by mouth at bedtime as needed for sleep.  07/01/17  Yes Carlyle Basques, MD  AMBULATORY NON FORMULARY MEDICATION Nitroglycerine ointment 0.125 %  Apply a pea sized amount internally four times daily. Patient not taking: Reported on 02/26/2020 08/01/19   Levin Erp, PA  Budesonide 2 MG/ACT FOAM Initial: 2 mg (one metered dose) twice daily (morning and evening) for 2 weeks followed by maintenance: 2 mg (one metered dose) once daily (evening) for 4 weeks. Patient not taking: Reported on 03/01/2020 01/13/20   Heilingoetter, Cassandra L, PA-C  diphenoxylate-atropine (LOMOTIL) 2.5-0.025 MG tablet Take 1-2 tablets by mouth 4 (four) times daily as needed for diarrhea or loose stools. Patient not  taking: Reported on 03/03/2020 12/06/19   Harle Stanford., PA-C  hydrocortisone (ANUSOL-HC) 25 MG suppository Place 1 suppository (25 mg total) rectally 2 (two) times daily. Patient not taking: Reported on 03/03/2020 12/26/19   Hayden Pedro, PA-C  lidocaine (XYLOCAINE) 2 % solution Apply topically to anus as needed for pain relief Patient not taking: Reported on 03/01/2020 12/06/19   Harle Stanford., PA-C  magnesium citrate SOLN Take 148-296 mLs (0.5-1 Bottles total) by mouth as needed for moderate constipation or severe constipation. Patient not taking: Reported on 02/26/2020 02/06/20   Acquanetta Chain, DO  senna-docusate (SENOKOT-S) 8.6-50 MG tablet Take 2 tablets by mouth 2 (two) times daily. Patient not taking: Reported on 02/26/2020 01/27/20   Acquanetta Chain, DO  valACYclovir (VALTREX) 1000 MG tablet Take 1 tablet (1,000 mg total) by mouth 3 (three) times daily. Patient not  taking: Reported on 02/26/2020 11/08/19   Carlyle Basques, MD  prochlorperazine (COMPAZINE) 10 MG tablet Take 1 tablet (10 mg total) by mouth every 6 (six) hours as needed (Nausea or vomiting). Patient not taking: Reported on 11/08/2019 07/26/19 01/16/20  Truitt Merle, MD    Physical Exam: Vitals:   03/03/20 1529 03/03/20 1712 03/03/20 1742 03/03/20 1822  BP:  95/63 96/63 107/84  Pulse:  83 81 71  Resp:  16 16 16   Temp:      TempSrc:      SpO2:  100% 92% 92%  Weight: 57.4 kg     Height: 5' (1.524 m)      Constitutional: Resting in bed in the left lateral decubitus position, NAD, calm, comfortable Eyes: PERRL, lids and conjunctivae normal ENMT: Mucous membranes are moist. Posterior pharynx clear of any exudate or lesions.Normal dentition.  Neck: normal, supple, no masses. Respiratory: clear to auscultation bilaterally, no wheezing, no crackles. Normal respiratory effort. No accessory muscle use.  Cardiovascular: Regular rate and rhythm, no murmurs / rubs / gallops. No extremity edema. 2+ pedal pulses. Abdomen: Mild suprapubic tenderness, no masses palpated. No hepatosplenomegaly. Bowel sounds positive.  GU: Right-sided perianal rectal mass with slight yellow discharge and surrounding erythema.  Further rectal exam not performed due to significant pain. Musculoskeletal: no clubbing / cyanosis. No joint deformity upper and lower extremities. Good ROM, no contractures. Normal muscle tone.  Skin: no rashes, lesions, ulcers. No induration Neurologic: CN 2-12 grossly intact. Sensation intact, Strength 5/5 in all 4.  Psychiatric: Normal judgment and insight. Alert and oriented x 3. Normal mood.   Labs on Admission: I have personally reviewed following labs and imaging studies  CBC: Recent Labs  Lab 02/26/20 1443 03/01/20 1208 03/03/20 1545  WBC 7.6 7.7 7.8  HGB 9.7* 9.3* 9.7*  HCT 29.4* 28.3* 30.7*  MCV 95.8 95.6 96.8  PLT 206 227 244   Basic Metabolic Panel: Recent Labs  Lab  02/26/20 1443 03/01/20 1208 03/03/20 1545  NA 131* 131* 131*  K 4.5 3.9 4.2  CL 96* 96* 98  CO2 26 25 24   GLUCOSE 102* 98 123*  BUN 12 12 12   CREATININE 0.73 0.75 0.76  CALCIUM 10.7* 10.0 10.3   GFR: Estimated Creatinine Clearance: 52 mL/min (by C-G formula based on SCr of 0.76 mg/dL). Liver Function Tests: Recent Labs  Lab 02/26/20 1443 03/01/20 1208 03/03/20 1545  AST 28 24 23   ALT 15 11 12   ALKPHOS 81 79 73  BILITOT 1.0 1.1 0.7  PROT 7.6 7.4 7.5  ALBUMIN 2.6* 2.5* 2.6*   No results for input(s): LIPASE, AMYLASE  in the last 168 hours. No results for input(s): AMMONIA in the last 168 hours. Coagulation Profile: No results for input(s): INR, PROTIME in the last 168 hours. Cardiac Enzymes: No results for input(s): CKTOTAL, CKMB, CKMBINDEX, TROPONINI in the last 168 hours. BNP (last 3 results) No results for input(s): PROBNP in the last 8760 hours. HbA1C: No results for input(s): HGBA1C in the last 72 hours. CBG: No results for input(s): GLUCAP in the last 168 hours. Lipid Profile: No results for input(s): CHOL, HDL, LDLCALC, TRIG, CHOLHDL, LDLDIRECT in the last 72 hours. Thyroid Function Tests: No results for input(s): TSH, T4TOTAL, FREET4, T3FREE, THYROIDAB in the last 72 hours. Anemia Panel: No results for input(s): VITAMINB12, FOLATE, FERRITIN, TIBC, IRON, RETICCTPCT in the last 72 hours. Urine analysis:    Component Value Date/Time   COLORURINE YELLOW 03/01/2020 1413   APPEARANCEUR HAZY (A) 03/01/2020 1413   LABSPEC 1.035 (H) 03/01/2020 1413   PHURINE 6.0 03/01/2020 1413   GLUCOSEU NEGATIVE 03/01/2020 1413   HGBUR MODERATE (A) 03/01/2020 1413   [place] 03/01/2020 [place] 03/01/2020 1413   PROTEINUR NEGATIVE 03/01/2020 1413   UROBILINOGEN >8.0 (H) 11/03/2014 2000   NITRITE NEGATIVE 03/01/2020 1413   LEUKOCYTESUR LARGE (A) 03/01/2020 1413    Radiological Exams on Admission: No results found.  EKG: Not  performed.  Assessment/Plan Principal Problem:   Anal cancer (HCC) Active Problems:   Human immunodeficiency virus (HIV) disease ([place])   Essential hypertension   Anemia  Vanessa Beard is a 69 y.o. 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  Anal squamous cell carcinoma with necrotic anorectal mass: Follows with oncology, Dr. Burr Medico, currently on therapy with [facility].  Initially hesitant for surgical management due to concerns over long-term colostomy possibility however due to significantly worsening symptoms she is agreeable to consider surgical intervention at this time. -General surgery consulted and to see in a.m. -Keep n.p.o. -Continue pain control with analgesics as needed -Placed on maintenance IV fluids overnight  Anemia due to chronic blood loss from anal cancer: Hemoglobin is currently stable.  Continue to monitor and transfuse PRBC if needed.  HIV: Last CD4 84, RNA VL 26 by labs 11/08/2019.  Not currently on PJP prophylaxis due to intolerance to Bactrim and dapsone.  Low CD4 counts previously felt related to chemotherapy.  Follows with ID, Dr. Graylon Good. -Continue Triumeq  Hypertension: Hold amlodipine due to soft blood pressures.  Chronic pain: Continue acute pain control with as needed Tylenol, oxycodone, and morphine with hold parameters.  DVT prophylaxis: SCDs Code Status: DNR, confirmed with patient Family Communication: Discussed with patient, she has discussed with family Disposition Plan: From home, dispo pending general surgery evaluation and recommendations Consults called: General surgery Admission status:  Status is:  Inpatient  Remains inpatient appropriate because:Ongoing active pain requiring inpatient pain management and Inpatient level of care appropriate due to severity of illness   Dispo: The patient is from: Home              Anticipated d/c is to: Home              Anticipated d/c date is: 3 days  Patient currently is not medically stable to d/c.   Zada Finders MD [facility]  If 7PM-7AM, please contact night-coverage [email or web address]  03/03/2020, 6:52 PM

## 2020-03-03 NOTE — ED Provider Notes (Signed)
Vanessa Beard   CSN: [id number] Arrival date & time: 03/03/20  1515     History Chief Complaint  Patient presents with  . Rectal Bleeding  . Rectal Pain    Vanessa Beard is a 69 y.o. female.  69 year old female with history of anal cancer presents with complaint of passing clots from her rectum. Patient states she has had pink and red tinged discharge from her rectum for the past week, today is the first time she has passed clots and was advised to come to the ER when she called the on call nurse for oncology. Patient reports feeling generally unwell today, unsure if feverish. States she has a difficulty time emptying her bladder at baseline, denies dysuria. Reports baseline loss of appetite. Patient was seen in the ER 2 days ago with worsening rectal pain, CT with possible necrotic mass, was seen in oncology clinic that same day. Patient was referred to [facility] for surgical consult, awaiting appointment. Patient is taking her pain medications without improvement in her pain, states this is normal for her at this point. No other complaints or concerns.         Past Medical History:  Diagnosis Date  . Allergic rhinitis   . Biliary colic   . Cancer ([place])   . Gall bladder disease \  . Hepatitis C   . Herpes   . HIV infection ([place])   . Hyperparathyroidism ([place])   . Hypertension   . Insomnia   . Osteoporosis   . Pneumonia    2010    Patient Active Problem List   Diagnosis Date Noted  . Palliative care encounter 01/27/2020  . Goals of care, counseling/discussion 01/16/2020  . Hemorrhoid 12/16/2019  . Rectal pain 12/16/2019  . Anal cancer ([place]) 07/26/2019  . Rectal mass 07/14/2019  . Constipation 07/14/2019  . Anal fissure 07/14/2019  . Healthcare maintenance 06/30/2018  . Left knee pain 12/30/2017  . Left shoulder pain 12/30/2017  . Closed fracture of right tibial plateau 02/18/2017  . Benign hypertension  02/15/2017  . Screening examination for venereal disease 03/06/2015  . Encounter for long-term (current) use of medications 03/06/2015  . Elevated LFTs 12/11/2014  . Abnormal ultrasound 12/11/2014  . Abdominal pain, epigastric 12/11/2014  . ASCUS of cervix with negative high risk HPV 10/31/2013  . Gall bladder disease 02/03/2013  . 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  . Compression fracture of L3 lumbar vertebra 07/01/2011  . Thyroid dysfunction 07/01/2011  . Anemia 07/01/2011  . Sore throat 04/02/2011  . Hyperparathyroidism 02/10/2011  . Osteopenia 02/03/2011  . PAP SMEAR, LGSIL, ABNORMAL 12/21/2009  . COUGH 07/18/2009  . Chronic hepatitis C virus infection ([place]) 12/19/2008  . LEUKORRHEA 04/07/2008  . FATIGUE 04/07/2008  . BACK PAIN, LUMBAR 11/10/2007  . ALLERGIC RHINITIS 07/09/2007  . PNEUMOCYSTIS PNEUMONIA 05/07/2007  . Essential hypertension 05/07/2007  . DENTAL CARIES 05/07/2007  . INSOMNIA 05/07/2007  . PNEUMONIA, HX OF 05/07/2007  . HERPES ZOSTER, HX OF 05/07/2007  . Human immunodeficiency virus (HIV) disease ([place]) 08/04/2006    Past Surgical History:  Procedure Laterality Date  . CHOLECYSTECTOMY N/A 01/09/2015   Procedure: LAPAROSCOPIC CHOLECYSTECTOMY;  Surgeon: [name], MD;  Location: [place];  Service: General;  Laterality: N/A;  . ECTOPIC PREGNANCY SURGERY    . ESOPHAGOGASTRODUODENOSCOPY (EGD) WITH PROPOFOL N/A 09/08/2019   Procedure: ESOPHAGOGASTRODUODENOSCOPY (EGD) WITH PROPOFOL;  Surgeon: Milus Banister, MD;  Location: [facility];  Service: Endoscopy;  Laterality:  N/A;  . IR FLUORO GUIDE CV LINE LEFT  08/15/2019  . ORIF TIBIA PLATEAU Right 02/18/2017   Procedure: OPEN REDUCTION INTERNAL FIXATION (ORIF) RIGHT TIBIAL PLATEAU;  Surgeon: Leandrew Koyanagi, MD;  Location: [facility] [facility];  Service: Orthopedics;  Laterality: Right;  . SHOULDER SURGERY Right 12/2014  . UPPER ESOPHAGEAL ENDOSCOPIC ULTRASOUND (EUS) N/A 09/08/2019   Procedure: UPPER  ESOPHAGEAL ENDOSCOPIC ULTRASOUND (EUS);  Surgeon: Milus Banister, MD;  Location: [facility];  Service: Endoscopy;  Laterality: N/A;     OB History    Gravida  5   Para  3   Term  3   Preterm  0   AB  2   Living  2     SAB  0   TAB  1   Ectopic  1   Multiple  0   Live Births              Family History  Problem Relation Age of Onset  . Diabetes Mother   . Hypertension Mother   . Bone cancer Half-Brother   . Prostate cancer Half-Brother   . Colon cancer Neg Hx   . Colon polyps Neg Hx   . Esophageal cancer Neg Hx   . Pancreatic cancer Neg Hx   . Stomach cancer Neg Hx   . Liver disease Neg Hx     Social History   Tobacco Use  . Smoking status: Never Smoker  . Smokeless tobacco: Never Used  Vaping Use  . Vaping Use: Never used  Substance Use Topics  . Alcohol use: Not Currently  . Drug use: No    Home Medications Prior to Admission medications   Medication Sig Start Date End Date Taking? Authorizing Provider  amLODipine (NORVASC) 10 MG tablet Take 1 tablet (10 mg total) by mouth daily. 08/23/19  Yes Carlyle Basques, MD  DULoxetine (CYMBALTA) 20 MG capsule Take 1 capsule (20 mg total) by mouth daily. 12/16/19  Yes Heilingoetter, Cassandra L, PA-C  gabapentin (NEURONTIN) 300 MG capsule Take 300 mg by mouth 2 (two) times daily.    Yes [provider]  methadone (DOLOPHINE) 5 MG tablet Take 5 mg by mouth every 6 (six) hours.    Yes [provider]  nitrofurantoin, macrocrystal-monohydrate, (MACROBID) 100 MG capsule Take 100 mg by mouth 2 (two) times daily. Start date: 02/27/20 02/27/20  Yes [provider]  oxyCODONE 10 MG TABS Take 1 tablet (10 mg total) by mouth every 6 (six) hours as needed for severe pain. Patient taking differently: Take 10 mg by mouth every 6 (six) hours as needed (pain).  02/24/20  Yes Acquanetta Chain, DO  potassium chloride SA (KLOR-CON) 20 MEQ tablet Take 1 tablet (20 mEq total) by mouth daily.  02/17/20  Yes Heilingoetter, Cassandra L, PA-C  sucralfate (CARAFATE) 1 g tablet TAKE 2 TABLETS AND MIX IN 4.5ML OF WATER AND PLACE IN ENEMA APPLICATOR USE TWICE DAILY Patient taking differently: Take 2 g by mouth daily.  02/13/20  Yes Heilingoetter, Cassandra L, PA-C  TRIUMEQ 600-50-300 MG tablet TAKE 1 TABLET BY MOUTH DAILY Patient taking differently: Take 1 tablet by mouth daily.  12/08/19  Yes Carlyle Basques, MD  zolpidem (AMBIEN) 10 MG tablet Take 1 tablet (10 mg total) by mouth at bedtime as needed. Patient taking differently: Take 10 mg by mouth at bedtime as needed for sleep.  07/01/17  Yes Carlyle Basques, MD  AMBULATORY NON FORMULARY MEDICATION Nitroglycerine ointment 0.125 %  Apply a pea sized  amount internally four times daily. Patient not taking: Reported on 02/26/2020 08/01/19   Levin Erp, PA  Budesonide 2 MG/ACT FOAM Initial: 2 mg (one metered dose) twice daily (morning and evening) for 2 weeks followed by maintenance: 2 mg (one metered dose) once daily (evening) for 4 weeks. Patient not taking: Reported on 03/01/2020 01/13/20   Heilingoetter, Cassandra L, PA-C  diphenoxylate-atropine (LOMOTIL) 2.5-0.025 MG tablet Take 1-2 tablets by mouth 4 (four) times daily as needed for diarrhea or loose stools. Patient not taking: Reported on 03/03/2020 12/06/19   Harle Stanford., PA-C  hydrocortisone (ANUSOL-HC) 25 MG suppository Place 1 suppository (25 mg total) rectally 2 (two) times daily. Patient not taking: Reported on 03/03/2020 12/26/19   Hayden Pedro, PA-C  lidocaine (XYLOCAINE) 2 % solution Apply topically to anus as needed for pain relief Patient not taking: Reported on 03/01/2020 12/06/19   Harle Stanford., PA-C  magnesium citrate SOLN Take 148-296 mLs (0.5-1 Bottles total) by mouth as needed for moderate constipation or severe constipation. Patient not taking: Reported on 02/26/2020 02/06/20   Acquanetta Chain, DO  senna-docusate (SENOKOT-S) 8.6-50 MG tablet Take 2  tablets by mouth 2 (two) times daily. Patient not taking: Reported on 02/26/2020 01/27/20   Acquanetta Chain, DO  valACYclovir (VALTREX) 1000 MG tablet Take 1 tablet (1,000 mg total) by mouth 3 (three) times daily. Patient not taking: Reported on 02/26/2020 11/08/19   Carlyle Basques, MD  prochlorperazine (COMPAZINE) 10 MG tablet Take 1 tablet (10 mg total) by mouth every 6 (six) hours as needed (Nausea or vomiting). Patient not taking: Reported on 11/08/2019 07/26/19 01/16/20  Truitt Merle, MD    Allergies    Dapsone, Retrovir [zidovudine], and Sulfamethoxazole-trimethoprim  Review of Systems   Review of Systems  Constitutional: Negative for fever.  Respiratory: Negative for shortness of breath.   Cardiovascular: Negative for chest pain.  Gastrointestinal: Positive for anal bleeding and constipation. Negative for abdominal pain, diarrhea, nausea and vomiting.  Genitourinary: Positive for difficulty urinating. Negative for dysuria.  Musculoskeletal: Negative for arthralgias and myalgias.  Skin: Negative for rash and wound.  Allergic/Immunologic: Positive for immunocompromised state.  Neurological: Negative for dizziness and weakness.  Psychiatric/Behavioral: Negative for confusion.  All other systems reviewed and are negative.   Physical Exam Updated Vital Signs BP 107/84   Pulse 71   Temp 98.3 F (36.8 C) (Oral)   Resp 16   Ht 5' (1.524 m)   Wt 57.4 kg   SpO2 92%   BMI 24.72 kg/m   Physical Exam Vitals and nursing Beard reviewed.  Constitutional:      General: She is not in acute distress.    Appearance: She is well-developed. She is not diaphoretic.  HENT:     Head: Normocephalic and atraumatic.  Cardiovascular:     Rate and Rhythm: Normal rate and regular rhythm.     Pulses: Normal pulses.     Heart sounds: Normal heart sounds.  Pulmonary:     Effort: Pulmonary effort is normal.     Breath sounds: Normal breath sounds.  Abdominal:     Palpations: Abdomen is soft.      Tenderness: There is no abdominal tenderness.  Genitourinary:    Comments: there is a ulcerated mass, mainly on the right side, with light yellow discharge, tender.  Minimal surrounding skin erythema with mild tenderness. No palpable pararectal mass or fistular. Rectal exam not performed due to pain  Skin:    General: Skin is warm  and dry.     Findings: No erythema or rash.  Neurological:     Mental Status: She is alert and oriented to person, place, and time.  Psychiatric:        Behavior: Behavior normal.     ED Results / Procedures / Treatments   Labs (all labs ordered are listed, but only abnormal results are displayed) Labs Reviewed  COMPREHENSIVE METABOLIC PANEL - Abnormal; Notable for the following components:      Result Value   Sodium 131 (*)    Glucose, Bld 123 (*)    Albumin 2.6 (*)    All other components within normal limits  CBC - Abnormal; Notable for the following components:   RBC 3.17 (*)    Hemoglobin 9.7 (*)    HCT 30.7 (*)    RDW 16.4 (*)    All other components within normal limits  CULTURE, BLOOD (ROUTINE X 2)  CULTURE, BLOOD (ROUTINE X 2)  RESPIRATORY PANEL BY RT PCR (FLU A&B, COVID)  LACTIC ACID, PLASMA  LACTIC ACID, PLASMA  URINALYSIS, ROUTINE W REFLEX MICROSCOPIC  TYPE AND SCREEN  ABO/RH    EKG None  Radiology No results found.  Procedures Procedures (including critical care time)  Medications Ordered in ED Medications  sodium chloride 0.9 % bolus 500 mL (500 mLs Intravenous New Bag/Given 03/03/20 1704)    ED Course  I have reviewed the triage vital signs and the nursing notes.  Pertinent labs & imaging results that were available during my care of the patient were reviewed by me and considered in my medical decision making (see chart for details).  Clinical Course as of Mar 03 1908  Sat Oct 16, 559  [id number] 69 year old female with anal cancer presents with feeling generally unwell, pain not controlled, concerned about passing  blood clots per rectum today (baseline has red/pink/yellow discharge). Also reports progressively worsening difficulty passing urine and stool.  On exam, abdomen is non tender, previously documented ulcerated anal mass appears unchanged from prior exam notes.  Labs with unchanged Hgb, currently 9.7. CMP without significant changes. Lactic acid obtained due to feeling unwell/questionably feverish at home with low BP readings in the ER today, reassuring at 1.8. Patient was given IV fluids, vitals remain stable, no significant bleeding on exam or on recheck.  Prior CT from 03/01/20 visit as well as follow up oncology visit notes reviewed.  Discussed with patient, unable to void or pass stool at this time. Patient was previously resistant to surgery/possible colostomy however would now like this considered due to inability to pass stool or void. Patient would like to get back to life before her cancer diagnosis (was attending college classes).  Patient is agreeable to admission. Discussed with Dr. Jeanell Sparrow, Er attending who has seen the patient.  Discussed with Dr. Posey Pronto with [facility], requests consult to surgery. Discussed with Dr. Brantley Stage with general surgery who will consult tomorrow, requests patient remain NPO. Discussed with Dr. Posey Pronto who will consult for admission.    [LM]    Clinical Course User Index [LM] Roque Lias   MDM Rules/Calculators/A&P                          Final Clinical Impression(s) / ED Diagnoses Final diagnoses:  Anal cancer ([place])  Acute GI bleeding    Rx / DC Orders ED Discharge Orders    None       Vanessa Learn, PA-C  03/03/20 1910    Pattricia Boss, MD 03/06/20 1159

## 2020-03-03 NOTE — ED Triage Notes (Signed)
Patient c/o rectal bleeding and increased rectal pain in the past 2 days. Patient reports a history of rectal cancer.

## 2020-03-04 ENCOUNTER — Encounter (HOSPITAL_COMMUNITY): Payer: Self-pay | Admitting: Internal Medicine

## 2020-03-04 DIAGNOSIS — C210 Malignant neoplasm of anus, unspecified: Secondary | ICD-10-CM | POA: Diagnosis not present

## 2020-03-04 LAB — CBC
HCT: 25.7 % — ABNORMAL LOW (ref 36.0–46.0)
Hemoglobin: 8.3 g/dL — ABNORMAL LOW (ref 12.0–15.0)
MCH: 31.4 pg (ref 26.0–34.0)
MCHC: 32.3 g/dL (ref 30.0–36.0)
MCV: 97.3 fL (ref 80.0–100.0)
Platelets: 190 10*3/uL (ref 150–400)
RBC: 2.64 MIL/uL — ABNORMAL LOW (ref 3.87–5.11)
RDW: 16.6 % — ABNORMAL HIGH (ref 11.5–15.5)
WBC: 6.2 10*3/uL (ref 4.0–10.5)
nRBC: 0 % (ref 0.0–0.2)

## 2020-03-04 LAB — BASIC METABOLIC PANEL
Anion gap: 6 (ref 5–15)
BUN: 11 mg/dL (ref 8–23)
CO2: 25 mmol/L (ref 22–32)
Calcium: 9.4 mg/dL (ref 8.9–10.3)
Chloride: 101 mmol/L (ref 98–111)
Creatinine, Ser: 0.59 mg/dL (ref 0.44–1.00)
GFR, Estimated: >60 mL/min (ref >60)
Glucose, Bld: 89 mg/dL (ref 70–99)
Potassium: 4 mmol/L (ref 3.5–5.1)
Sodium: 132 mmol/L — ABNORMAL LOW (ref 135–145)

## 2020-03-04 MED ORDER — LIP MEDEX EX OINT
TOPICAL_OINTMENT | CUTANEOUS | Status: DC | PRN
  Administered 2020-03-09: 1 via TOPICAL
  Filled 2020-03-04: qty 7

## 2020-03-04 MED ORDER — LACTATED RINGERS IV SOLN: INTRAVENOUS | Status: AC

## 2020-03-04 MED ORDER — HYDROMORPHONE HCL 1 MG/ML IJ SOLN
1.0000 mg | INTRAMUSCULAR | Status: DC | PRN
  Administered 2020-03-04 – 2020-03-06 (×7): 1 mg via INTRAVENOUS
  Filled 2020-03-04 (×7): qty 1

## 2020-03-04 MED ORDER — ENSURE ENLIVE PO LIQD
237.0000 mL | Freq: Two times a day (BID) | ORAL | Status: DC
  Administered 2020-03-05: 237 mL via ORAL

## 2020-03-04 MED ORDER — INFLUENZA VAC A&B SA ADJ QUAD 0.5 ML IM PRSY
0.5000 mL | PREFILLED_SYRINGE | INTRAMUSCULAR | Status: DC
  Filled 2020-03-04: qty 0.5

## 2020-03-04 NOTE — Consult Note (Addendum)
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 female.  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  She underwent chemotherapy and radiation therapy per protocol.  She initially had a good response the summer but has not unfortunate recurred looks like.  She had now has a large lesion involving the circumference of her anal canal.  She has difficulty with bowel movements having pencillike bowel movements or liquid stool from the anus.  She is having more pain and has had a recurrence of her disease.  She initially was offered surgical treatment in the past but refused.  She has been followed by oncology but has worsened this week and sought care in the emergency room and was admitted to the medical service.  She is able to pass some material per rectum but has a difficult time now.  Denies any significant abdominal pain currently.  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  There is no obvious metastatic disease on her CT scan from yesterday.  Past Medical History:  Diagnosis Date  . Allergic rhinitis   . Biliary colic   . Cancer ([place])   . Gall bladder disease \  . Hepatitis C   . Herpes   . HIV infection ([place])   . Hyperparathyroidism ([place])   . Hypertension   . Insomnia   . Osteoporosis   . Pneumonia    2010    Past Surgical History:  Procedure Laterality Date  . CHOLECYSTECTOMY N/A 01/09/2015   Procedure: LAPAROSCOPIC CHOLECYSTECTOMY;  Surgeon: [name], MD;  Location: [facility];  Service: General;  Laterality: N/A;  . ECTOPIC PREGNANCY SURGERY    . ESOPHAGOGASTRODUODENOSCOPY (EGD) WITH PROPOFOL N/A 09/08/2019   Procedure: ESOPHAGOGASTRODUODENOSCOPY (EGD) WITH PROPOFOL;  Surgeon: Milus Banister, MD;  Location: [facility];   Service: Endoscopy;  Laterality: N/A;  . IR FLUORO GUIDE CV LINE LEFT  08/15/2019  . ORIF TIBIA PLATEAU Right 02/18/2017   Procedure: OPEN REDUCTION INTERNAL FIXATION (ORIF) RIGHT TIBIAL PLATEAU;  Surgeon: Leandrew Koyanagi, MD;  Location: [facility];  Service: Orthopedics;  Laterality: Right;  . SHOULDER SURGERY Right 12/2014  . UPPER ESOPHAGEAL ENDOSCOPIC ULTRASOUND (EUS) N/A 09/08/2019   Procedure: UPPER ESOPHAGEAL ENDOSCOPIC ULTRASOUND (EUS);  Surgeon: Milus Banister, MD;  Location: [facility];  Service: Endoscopy;  Laterality: N/A;    Family History  Problem Relation Age of Onset  . Diabetes Mother   . Hypertension Mother   . Bone cancer Half-Brother   . Prostate cancer Half-Brother   . Colon cancer Neg Hx   . Colon polyps Neg Hx   . Esophageal cancer Neg Hx   . Pancreatic cancer Neg Hx   . Stomach cancer Neg Hx   . Liver disease Neg Hx     Social History:  reports that she has never smoked. She has never used smokeless tobacco. She reports previous alcohol use. She reports that she does not use drugs.  Allergies:  Allergies  Allergen Reactions  . Dapsone Nausea Only    stomach  burning  . Retrovir [Zidovudine] Other (See Comments)    Changed skin color  . Sulfamethoxazole-Trimethoprim Other (See Comments)    Flu like symptoms    Medications: I have reviewed the patient's current medications.  Results for orders  placed or performed during the hospital encounter of 03/03/20 (from the past 48 hour(s))  Comprehensive metabolic panel     Status: Abnormal   Collection Time: 03/03/20  3:45 PM  Result Value Ref Range   Sodium 131 (L) 135 - 145 mmol/L   Potassium 4.2 3.5 - 5.1 mmol/L   Chloride 98 98 - 111 mmol/L   CO2 24 22 - 32 mmol/L   Glucose, Bld 123 (H) 70 - 99 mg/dL    Comment: Glucose reference range applies only to samples taken after fasting for at least 8 hours.   BUN 12 8 - 23 mg/dL   Creatinine, Ser 0.76 0.44 - 1.00 mg/dL   Calcium 10.3 8.9 - 10.3 mg/dL   Total  Protein 7.5 6.5 - 8.1 g/dL   Albumin 2.6 (L) 3.5 - 5.0 g/dL   AST 23 15 - 41 U/L   ALT 12 0 - 44 U/L   Alkaline Phosphatase 73 38 - 126 U/L   Total Bilirubin 0.7 0.3 - 1.2 mg/dL   GFR, Estimated >60 >60 mL/min   Anion gap 9 5 - 15    Comment: Performed at [facility], [place] [address], [place], [place] [address]  CBC     Status: Abnormal   Collection Time: 03/03/20  3:45 PM  Result Value Ref Range   WBC 7.8 4.0 - 10.5 K/uL   RBC 3.17 (L) 3.87 - 5.11 MIL/uL   Hemoglobin 9.7 (L) 12.0 - 15.0 g/dL   HCT 30.7 (L) 36 - 46 %   MCV 96.8 80.0 - 100.0 fL   MCH 30.6 26.0 - 34.0 pg   MCHC 31.6 30.0 - 36.0 g/dL   RDW 16.4 (H) 11.5 - 15.5 %   Platelets 274 150 - 400 K/uL   nRBC 0.0 0.0 - 0.2 %    Comment: Performed at [facility], [place] [address] [place], [place] [address]  Type and screen [place]     Status: None   Collection Time: 03/03/20  3:45 PM  Result Value Ref Range   ABO/RH(D) A POS    Antibody Screen NEG    Sample Expiration      03/06/2020,2359 Performed at [facility], [place] [address] [place], [place] [address]   Lactic acid, plasma     Status: None   Collection Time: 03/03/20  4:28 PM  Result Value Ref Range   Lactic Acid, Venous 1.8 0.5 - 1.9 mmol/L    Comment: Performed at [facility], [place] [address] [place], [place] [address]  Urinalysis, Routine w reflex microscopic Urine, Catheterized     Status: Abnormal   Collection Time: 03/03/20  4:28 PM  Result Value Ref Range   Color, Urine AMBER (A) YELLOW    Comment: BIOCHEMICALS MAY BE AFFECTED BY COLOR   APPearance HAZY (A) CLEAR   Specific Gravity, Urine 1.018 1.005 - 1.030   pH 6.0 5.0 - 8.0   Glucose, UA NEGATIVE NEGATIVE mg/dL   Hgb urine dipstick MODERATE (A) NEGATIVE   Bilirubin Urine NEGATIVE NEGATIVE   Ketones, ur NEGATIVE NEGATIVE mg/dL   Protein, ur 30 (A) NEGATIVE mg/dL   Nitrite NEGATIVE NEGATIVE    Leukocytes,Ua MODERATE (A) NEGATIVE   RBC / HPF 11-20 0 - 5 RBC/hpf   WBC, UA >50 (H) 0 - 5 WBC/hpf   Bacteria, UA RARE (A) NONE SEEN   Squamous Epithelial / LPF 6-10 0 - 5   Mucus PRESENT  Comment: Performed at [facility], [place] [address] [place], [place] [address]  Respiratory Panel by RT PCR (Flu A&B, Covid) - Nasopharyngeal Swab     Status: None   Collection Time: 03/03/20  6:09 PM   Specimen: Nasopharyngeal Swab  Result Value Ref Range   SARS Coronavirus 2 by RT PCR NEGATIVE NEGATIVE    Comment: (NOTE) SARS-CoV-2 target nucleic acids are NOT DETECTED.  The SARS-CoV-2 RNA is generally detectable in upper respiratoy specimens during the acute phase of infection. The lowest concentration of SARS-CoV-2 viral copies this assay can detect is 131 copies/mL. A negative result does not preclude SARS-Cov-2 infection and should not be used as the sole basis for treatment or other patient management decisions. A negative result may occur with  improper specimen collection/handling, submission of specimen other than nasopharyngeal swab, presence of viral mutation(s) within the areas targeted by this assay, and inadequate number of viral copies (<131 copies/mL). A negative result must be combined with clinical observations, patient history, and epidemiological information. The expected result is Negative.  Fact Sheet for Patients:  PinkCheek.be  Fact Sheet for Healthcare Providers:  GravelBags.it  This test is no t yet approved or cleared by the [place] FDA and  has been authorized for detection and/or diagnosis of SARS-CoV-2 by FDA under an Emergency Use Authorization (EUA). This EUA will remain  in effect (meaning this test can be used) for the duration of the COVID-19 declaration under Section 564(b)(1) of the Act, 21 U.S.C. section 360bbb-3(b)(1), unless the authorization is terminated or revoked  sooner.     Influenza A by PCR NEGATIVE NEGATIVE   Influenza B by PCR NEGATIVE NEGATIVE    Comment: (NOTE) The Xpert Xpress SARS-CoV-2/FLU/RSV assay is intended as an aid in  the diagnosis of influenza from Nasopharyngeal swab specimens and  should not be used as a sole basis for treatment. Nasal washings and  aspirates are unacceptable for Xpert Xpress SARS-CoV-2/FLU/RSV  testing.  Fact Sheet for Patients: PinkCheek.be  Fact Sheet for Healthcare Providers: GravelBags.it  This test is not yet approved or cleared by the [place] FDA and  has been authorized for detection and/or diagnosis of SARS-CoV-2 by  FDA under an Emergency Use Authorization (EUA). This EUA will remain  in effect (meaning this test can be used) for the duration of the  Covid-19 declaration under Section 564(b)(1) of the Act, 21  U.S.C. section 360bbb-3(b)(1), unless the authorization is  terminated or revoked. Performed at [facility], [place] [address], [place], [place] [address]   CBC     Status: Abnormal   Collection Time: 03/04/20  4:30 AM  Result Value Ref Range   WBC 6.2 4.0 - 10.5 K/uL   RBC 2.64 (L) 3.87 - 5.11 MIL/uL   Hemoglobin 8.3 (L) 12.0 - 15.0 g/dL   HCT 25.7 (L) 36 - 46 %   MCV 97.3 80.0 - 100.0 fL   MCH 31.4 26.0 - 34.0 pg   MCHC 32.3 30.0 - 36.0 g/dL   RDW 16.6 (H) 11.5 - 15.5 %   Platelets 190 150 - 400 K/uL   nRBC 0.0 0.0 - 0.2 %    Comment: Performed at [facility], [place] [address] [place], [place] [address]  Basic metabolic panel     Status: Abnormal   Collection Time: 03/04/20  4:30 AM  Result Value Ref Range   Sodium 132 (L) 135 - 145 mmol/L   Potassium 4.0 3.5 - 5.1  mmol/L   Chloride 101 98 - 111 mmol/L   CO2 25 22 - 32 mmol/L   Glucose, Bld 89 70 - 99 mg/dL    Comment: Glucose reference range applies only to samples taken after fasting for at least 8 hours.   BUN 11 8 - 23  mg/dL   Creatinine, Ser 0.59 0.44 - 1.00 mg/dL   Calcium 9.4 8.9 - 10.3 mg/dL   GFR, Estimated >60 >60 mL/min   Anion gap 6 5 - 15    Comment: Performed at [facility], [place] [address] [place], [place] [address]    No results found.  Review of Systems  Constitutional: Positive for appetite change and fatigue.  HENT: Negative.   Respiratory: Negative.   Cardiovascular: Negative.   Gastrointestinal: Positive for blood in stool and rectal pain.  Endocrine: Negative.   Genitourinary: Positive for difficulty urinating. Negative for enuresis.  Allergic/Immunologic: Negative.   Neurological: Negative.   Psychiatric/Behavioral: Positive for dysphoric mood. Negative for agitation.   Blood pressure 111/73, pulse 79, temperature 98.7 F (37.1 C), temperature source Oral, resp. rate 17, height 5' (1.524 m), weight 57.4 kg, SpO2 94 %. Physical Exam Constitutional:      General: She is not in acute distress.    Appearance: Normal appearance.  HENT:     Head: Normocephalic and atraumatic.     Nose: Nose normal. No congestion or rhinorrhea.  Eyes:     Extraocular Movements: Extraocular movements intact.     Pupils: Pupils are equal, round, and reactive to light.  Cardiovascular:     Rate and Rhythm: Regular rhythm.  Pulmonary:     Effort: Pulmonary effort is normal.     Breath sounds: Normal breath sounds.  Abdominal:     General: Abdomen is flat. There is no distension.     Tenderness: There is no abdominal tenderness.  Genitourinary:   Musculoskeletal:     Cervical back: Normal range of motion and neck supple.  Skin:    General: Skin is warm and dry.  Neurological:     General: No focal deficit present.     Mental Status: She is alert and oriented to person, place, and time.     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  I discussed  with her potential salvage APR versus just a diverting colostomy.  Given previous radiation treatments, she is at high risk for perineal wound breakdown with chronic wound and possible poor healing or no healing at all.  Currently, she is not totally obstructed but this will progress to that.  I discussed the need for colostomy in the circumstances and that will be permanent.  Follow-up in a.m.  Recommend getting oncology on board and we can discuss this further with one of her colorectal specialist to determine best course of action.  I talk with her this morning about these options and asked her to think about these for now.  Patient Active Problem List   Diagnosis Date Noted  . Palliative care encounter 01/27/2020  . Goals of care, counseling/discussion 01/16/2020  . Hemorrhoid 12/16/2019  . Rectal pain 12/16/2019  . Anal cancer ([place]) 07/26/2019  . Rectal mass 07/14/2019  . Constipation 07/14/2019  . Anal fissure 07/14/2019  . Healthcare maintenance 06/30/2018  . Left knee pain 12/30/2017  . Left shoulder pain 12/30/2017  . Closed fracture of right tibial plateau 02/18/2017  . Benign hypertension 02/15/2017  .  Screening examination for venereal disease 03/06/2015  . Encounter for long-term (current) use of medications 03/06/2015  . Elevated LFTs 12/11/2014  . Abnormal ultrasound 12/11/2014  . Abdominal pain, epigastric 12/11/2014  . ASCUS of cervix with negative high risk HPV 10/31/2013  . Gall bladder disease 02/03/2013  . 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  . Compression fracture of L3 lumbar vertebra 07/01/2011  . Thyroid dysfunction 07/01/2011  . Anemia 07/01/2011  . Sore throat 04/02/2011  . Hyperparathyroidism 02/10/2011  . Osteopenia 02/03/2011  . PAP SMEAR, LGSIL, ABNORMAL 12/21/2009  . COUGH 07/18/2009  . Chronic hepatitis C virus infection ([place]) 12/19/2008  . LEUKORRHEA 04/07/2008  . FATIGUE 04/07/2008  . BACK PAIN, LUMBAR 11/10/2007  .  ALLERGIC RHINITIS 07/09/2007  . PNEUMOCYSTIS PNEUMONIA 05/07/2007  . Essential hypertension 05/07/2007  . DENTAL CARIES 05/07/2007  . INSOMNIA 05/07/2007  . PNEUMONIA, HX OF 05/07/2007  . HERPES ZOSTER, HX OF 05/07/2007  . Human immunodeficiency virus (HIV) disease ([place]) 08/04/2006    Vanessa Beard 03/04/2020, 7:46 AM

## 2020-03-04 NOTE — Progress Notes (Signed)
PROGRESS NOTE    Vanessa Beard  TGY:[id number] DOB: 04-27-1951 DOA: 03/03/2020 PCP: Seward Carol, MD    Chief Complaint  Patient presents with  . Rectal Bleeding  . Rectal Pain    Brief Narrative:   H/o HTN, HIV (last CD4 32, RNA VL 26 by labs 11/08/2019), hepatitis C s/p treatment 2018,anal squamous cell carcinoma currently on treatment with Keytruda, admitted for management of anal squamous cell carcinoma with necrotic anorectal mass  Subjective:  Reports pain is not controlled by iv morphin,  Reports recently poor appetite, no n/v  Assessment & Plan:   Principal Problem:   Anal cancer ([place]) Active Problems:   Human immunodeficiency virus (HIV) disease ([place])   Essential hypertension   Anemia  Anal squamous cell carcinoma with necrotic anorectal mass: -Initially hesitant for surgical management due to concerns over long-term colostomy possibility however due to significantly worsening symptoms she is agreeable to consider surgical intervention at this time. -Pain control, -General surgery consulted, surgery time to be determined , start full liquid diet for now, continue hydration, change IV morphine to IV Dilaudid for pain control -need to notify oncology Dr. Burr Medico on Monday  Hyponatremia, mild Likely due to poor intake, dehydration Start IV hydration  Anemia due to chronic blood loss from anal cancer: Hemoglobin is currently stable.  Continue to monitor and transfuse PRBC if needed.  HIV: Last CD4 84, RNA VL 26 by labs 11/08/2019.  Not currently on PJP prophylaxis due to intolerance to Bactrim and dapsone.  Low CD4 counts previously felt related to chemotherapy.  Follows with ID, Dr. Graylon Good. -Continue Triumeq  Hypertension: Hold amlodipine due to soft blood pressures  Body mass index is 24.72 kg/m.   DVT prophylaxis: SCDs Start: 03/03/20 1954   Code Status: DNR Family Communication: Patient Disposition:   Status is: Inpatient  Dispo: The  patient is from: Home              Anticipated d/c is to: TBD              Anticipated d/c date is: TBD              Patient currently not medically stable to discharge  Consultants:   General surgery  Procedures:   None  Antimicrobials:   None     Objective: Vitals:   03/04/20 0752 03/04/20 0800 03/04/20 0919 03/04/20 1257  BP: 114/78 100/72 95/68 100/84  Pulse: 93 73 78 81  Resp: 16  18 18   Temp: 98.2 F (36.8 C)  98.6 F (37 C) 98 F (36.7 C)  TempSrc: Oral  Oral Oral  SpO2: 94% 94% 94% 93%  Weight:      Height:        Intake/Output Summary (Last 24 hours) at 03/04/2020 1558 Last data filed at 03/04/2020 0844 Gross per 24 hour  Intake 1000 ml  Output --  Net 1000 ml   Filed Weights   03/03/20 1529  Weight: 57.4 kg    Examination:  General exam: Weak, and frail , in pain, appears dehydrated Respiratory system: Clear to auscultation.  Poor respiratory effort Cardiovascular system: S1 & S2 heard, No pedal edema. Gastrointestinal system: Abdomen is nondistended, soft and nontender.  Normal bowel sounds heard.  Rectal exam deferred Central nervous system: Alert and oriented. No focal neurological deficits. Extremities: Generalized weakness Skin: No rashes, lesions or ulcers Psychiatry: Calm and cooperative    Data Reviewed: I have personally reviewed following labs and imaging studies  CBC:  Recent Labs  Lab 03/01/20 1208 03/03/20 1545 03/04/20 0430  WBC 7.7 7.8 6.2  HGB 9.3* 9.7* 8.3*  HCT 28.3* 30.7* 25.7*  MCV 95.6 96.8 97.3  PLT 227 274 235    Basic Metabolic Panel: Recent Labs  Lab 03/01/20 1208 03/03/20 1545 03/04/20 0430  NA 131* 131* 132*  K 3.9 4.2 4.0  CL 96* 98 101  CO2 25 24 25   GLUCOSE 98 123* 89  BUN 12 12 11   CREATININE 0.75 0.76 0.59  CALCIUM 10.0 10.3 9.4    GFR: Estimated Creatinine Clearance: 52 mL/min (by C-G formula based on SCr of 0.59 mg/dL).  Liver Function Tests: Recent Labs  Lab 03/01/20 1208  03/03/20 1545  AST 24 23  ALT 11 12  ALKPHOS 79 73  BILITOT 1.1 0.7  PROT 7.4 7.5  ALBUMIN 2.5* 2.6*    CBG: No results for input(s): GLUCAP in the last 168 hours.   Recent Results (from the past 240 hour(s))  Urine culture     Status: Abnormal   Collection Time: 02/26/20  5:13 PM   Specimen: Urine, Random  Result Value Ref Range Status   Specimen Description   Final    URINE, RANDOM Performed at [place] [address], [place], [place] [address]    Special Requests   Final    NONE Performed at [facility], [place] [address] [place], [place] [address]    Culture MULTIPLE SPECIES PRESENT, SUGGEST RECOLLECTION (A)  Final   Report Status 02/27/2020 FINAL  Final  Urine culture     Status: Abnormal   Collection Time: 03/01/20  2:13 PM   Specimen: Urine, Random  Result Value Ref Range Status   Specimen Description   Final    URINE, RANDOM Performed at [place] [address] [place], [place] [address]    Special Requests   Final    NONE Performed at [facility], [place] [address], [place], [place] [address]    Culture (A)  Final    >=100,000 COLONIES/mL MULTIPLE SPECIES PRESENT, SUGGEST RECOLLECTION   Report Status 03/03/2020 FINAL  Final  Respiratory Panel by RT PCR (Flu A&B, Covid) - Nasopharyngeal Swab     Status: None   Collection Time: 03/03/20  6:09 PM   Specimen: Nasopharyngeal Swab  Result Value Ref Range Status   SARS Coronavirus 2 by RT PCR NEGATIVE NEGATIVE Final    Comment: (NOTE) SARS-CoV-2 target nucleic acids are NOT DETECTED.  The SARS-CoV-2 RNA is generally detectable in upper respiratoy specimens during the acute phase of infection. The lowest concentration of SARS-CoV-2 viral copies this assay can detect is 131 copies/mL. A negative result does not preclude SARS-Cov-2 infection and should not be used as the sole basis for treatment or other patient management  decisions. A negative result may occur with  improper specimen collection/handling, submission of specimen other than nasopharyngeal swab, presence of viral mutation(s) within the areas targeted by this assay, and inadequate number of viral copies (<131 copies/mL). A negative result must be combined with clinical observations, patient history, and epidemiological information. The expected result is Negative.  Fact Sheet for Patients:  PinkCheek.be  Fact Sheet for Healthcare Providers:  GravelBags.it  This test is no t yet approved or cleared by the [place] FDA and  has been authorized for detection and/or diagnosis of SARS-CoV-2 by FDA under an Emergency Use Authorization (EUA). This EUA will remain  in effect (meaning this test  can be used) for the duration of the COVID-19 declaration under Section 564(b)(1) of the Act, 21 U.S.C. section 360bbb-3(b)(1), unless the authorization is terminated or revoked sooner.     Influenza A by PCR NEGATIVE NEGATIVE Final   Influenza B by PCR NEGATIVE NEGATIVE Final    Comment: (NOTE) The Xpert Xpress SARS-CoV-2/FLU/RSV assay is intended as an aid in  the diagnosis of influenza from Nasopharyngeal swab specimens and  should not be used as a sole basis for treatment. Nasal washings and  aspirates are unacceptable for Xpert Xpress SARS-CoV-2/FLU/RSV  testing.  Fact Sheet for Patients: PinkCheek.be  Fact Sheet for Healthcare Providers: GravelBags.it  This test is not yet approved or cleared by the [place] FDA and  has been authorized for detection and/or diagnosis of SARS-CoV-2 by  FDA under an Emergency Use Authorization (EUA). This EUA will remain  in effect (meaning this test can be used) for the duration of the  Covid-19 declaration under Section 564(b)(1) of the Act, 21  U.S.C. section 360bbb-3(b)(1), unless the  authorization is  terminated or revoked. Performed at [facility], [place] [address], [place], [place] [address]          Radiology Studies: No results found.      Scheduled Meds: . abacavir-dolutegravir-lamiVUDine  1 tablet Oral Daily  . DULoxetine  20 mg Oral Daily  . gabapentin  300 mg Oral BID   Continuous Infusions: . lactated ringers 75 mL/hr at 03/04/20 1231     LOS: 1 day   Time spent: 72mins Greater than 50% of this time was spent in counseling, explanation of diagnosis, planning of further management, and coordination of care.  I have personally reviewed and interpreted on  03/04/2020 daily labs, I reviewed all nursing notes, pharmacy notes, consultant notes,  vitals, pertinent old records  I have discussed plan of care as described above with RN , patient  on 03/04/2020  Voice Recognition /Dragon dictation system was used to create this note, attempts have been made to correct errors. Please contact the author with questions and/or clarifications.   Florencia Reasons, MD PhD FACP [facility]  Available via Epic secure chat 7am-7pm for nonurgent issues Please page for urgent issues To page the attending provider between 7A-7P or the covering provider during after hours 7P-7A, please log into the web site [email or web address] and access using universal [place] password for that web site. If you do not have the password, please call the hospital operator.    03/04/2020, 3:58 PM

## 2020-03-04 NOTE — ED Notes (Signed)
Report called to [facility]

## 2020-03-05 ENCOUNTER — Other Ambulatory Visit: Payer: Self-pay | Admitting: Hematology

## 2020-03-05 ENCOUNTER — Ambulatory Visit: Payer: Medicare HMO | Admitting: Internal Medicine

## 2020-03-05 DIAGNOSIS — C210 Malignant neoplasm of anus, unspecified: Secondary | ICD-10-CM

## 2020-03-05 DIAGNOSIS — E43 Unspecified severe protein-calorie malnutrition: Secondary | ICD-10-CM | POA: Insufficient documentation

## 2020-03-05 LAB — BASIC METABOLIC PANEL
Anion gap: 7 (ref 5–15)
BUN: 9 mg/dL (ref 8–23)
CO2: 24 mmol/L (ref 22–32)
Calcium: 9.4 mg/dL (ref 8.9–10.3)
Chloride: 101 mmol/L (ref 98–111)
Creatinine, Ser: 0.59 mg/dL (ref 0.44–1.00)
GFR, Estimated: >60 mL/min (ref >60)
Glucose, Bld: 87 mg/dL (ref 70–99)
Potassium: 3.9 mmol/L (ref 3.5–5.1)
Sodium: 132 mmol/L — ABNORMAL LOW (ref 135–145)

## 2020-03-05 LAB — CBC WITH DIFFERENTIAL/PLATELET
Abs Immature Granulocytes: 0.03 10*3/uL (ref 0.00–0.07)
Basophils Absolute: 0 10*3/uL (ref 0.0–0.1)
Basophils Relative: 0 %
Eosinophils Absolute: 0.1 10*3/uL (ref 0.0–0.5)
Eosinophils Relative: 2 %
HCT: 28.3 % — ABNORMAL LOW (ref 36.0–46.0)
Hemoglobin: 9.2 g/dL — ABNORMAL LOW (ref 12.0–15.0)
Immature Granulocytes: 0 %
Lymphocytes Relative: 10 %
Lymphs Abs: 0.6 10*3/uL — ABNORMAL LOW (ref 0.7–4.0)
MCH: 31 pg (ref 26.0–34.0)
MCHC: 32.5 g/dL (ref 30.0–36.0)
MCV: 95.3 fL (ref 80.0–100.0)
Monocytes Absolute: 0.6 10*3/uL (ref 0.1–1.0)
Monocytes Relative: 9 %
Neutro Abs: 5.3 10*3/uL (ref 1.7–7.7)
Neutrophils Relative %: 79 %
Platelets: 189 10*3/uL (ref 150–400)
RBC: 2.97 MIL/uL — ABNORMAL LOW (ref 3.87–5.11)
RDW: 16.5 % — ABNORMAL HIGH (ref 11.5–15.5)
WBC: 6.7 10*3/uL (ref 4.0–10.5)
nRBC: 0 % (ref 0.0–0.2)

## 2020-03-05 LAB — T-HELPER CELLS (CD4) COUNT (NOT AT ARMC)
CD4 % Helper T Cell: 14 % — ABNORMAL LOW (ref 33–65)
CD4 T Cell Abs: 76 /uL — ABNORMAL LOW (ref 400–1790)

## 2020-03-05 LAB — ABO/RH: ABO/RH(D): A POS

## 2020-03-05 MED ORDER — BOOST PLUS PO LIQD
237.0000 mL | Freq: Three times a day (TID) | ORAL | Status: DC
  Administered 2020-03-05 – 2020-03-11 (×13): 237 mL via ORAL
  Filled 2020-03-05 (×19): qty 237

## 2020-03-05 MED ORDER — INFLUENZA VAC A&B SA ADJ QUAD 0.5 ML IM PRSY
0.5000 mL | PREFILLED_SYRINGE | INTRAMUSCULAR | Status: DC | PRN
  Filled 2020-03-05: qty 0.5

## 2020-03-05 NOTE — Progress Notes (Signed)
DISCONTINUE ON PATHWAY REGIMEN - Anal Carcinoma     A cycle is every 21 days:     Pembrolizumab   **Always confirm dose/schedule in your pharmacy ordering system**  REASON: Disease Progression PRIOR TREATMENT: ANLOS12: Pembrolizumab 200 mg q21 Days Until Progression or Unacceptable Toxicity TREATMENT RESPONSE: Progressive Disease (PD)  START OFF PATHWAY REGIMEN - Anal Carcinoma   [id number] (Leucovorin IV D1 + Fluorouracil IV D1/CIV D1,2 + Oxaliplatin IV D1) q14 Days:   A cycle is every 14 days:     Oxaliplatin      Leucovorin      Fluorouracil      Fluorouracil   **Always confirm dose/schedule in your pharmacy ordering system**  Patient Characteristics: Anal Canal Tumors, Distant Metastases / Local Recurrence - Unresectable, Second Line and Beyond Therapeutic Status: Distant Metastases Line of therapy: Second Line and Beyond  Intent of Therapy: Non-Curative / Palliative Intent, Discussed with Patient

## 2020-03-05 NOTE — Progress Notes (Signed)
[facility] Progress Note     Subjective: Patient reports pain with BMs is primary concern. She typically is having BMs every other day and passing flatus. She denies abdominal pain, n/v, or abdominal distention. She is asking whether she could be resected and not have a colostomy. We discussed reasons why this is not offered at this time. She takes miralax and colace occasionally to help with BMs but not regularly. She also reports pain with ambulation and sometimes with urination.   Objective: Vital signs in last 24 hours: Temp:  [98 F (36.7 C)-98.8 F (37.1 C)] 98.4 F (36.9 C) (10/18 0540) Pulse Rate:  [73-84] 84 (10/18 0540) Resp:  [15-18] 16 (10/18 0540) BP: (95-113)/(68-84) 113/82 (10/18 0540) SpO2:  [93 %-99 %] 95 % (10/18 0540) Last BM Date: 03/04/20  Intake/Output from previous day: 10/17 0701 - 10/18 0700 In: 2438.1 [P.O.:240; I.V.:2198.1] Out: 100 [Urine:100] Intake/Output this shift: No intake/output data recorded.  PE: General: pleasant, WD, chronically ill appearing female who is laying in bed in NAD Heart: regular, rate, and rhythm.  Normal s1,s2. No obvious murmurs, gallops, or rubs noted.  Lungs: CTAB, no wheezes, rhonchi, or rales noted.  Respiratory effort nonlabored Abd: soft, NT, ND, +BS GU: discharge from rectum and palpable mass in R perirectal area with ttp Psych: A&Ox3 with an appropriate affect.   Lab Results:  Recent Labs    03/04/20 0430 03/05/20 0541  WBC 6.2 6.7  HGB 8.3* 9.2*  HCT 25.7* 28.3*  PLT 190 189   BMET Recent Labs    03/04/20 0430 03/05/20 0541  NA 132* 132*  K 4.0 3.9  CL 101 101  CO2 25 24  GLUCOSE 89 87  BUN 11 9  CREATININE 0.59 0.59  CALCIUM 9.4 9.4   PT/INR No results for input(s): LABPROT, INR in the last 72 hours. CMP     Component Value Date/Time   NA 132 (L) 03/05/2020 0541   NA 142 08/31/2013 1515   K 3.9 03/05/2020 0541   K 3.4 (L) 08/31/2013 1515   CL 101 03/05/2020 0541   CL 104  07/07/2012 1512   CO2 24 03/05/2020 0541   CO2 27 08/31/2013 1515   GLUCOSE 87 03/05/2020 0541   GLUCOSE 69 (L) 08/31/2013 1515   GLUCOSE 88 07/07/2012 1512   BUN 9 03/05/2020 0541   BUN 14.7 08/31/2013 1515   CREATININE 0.59 03/05/2020 0541   CREATININE 0.82 02/17/2020 0824   CREATININE 0.87 08/08/2019 1535   CREATININE 0.8 08/31/2013 1515   CALCIUM 9.4 03/05/2020 0541   CALCIUM 9.6 08/31/2013 1515   PROT 7.5 03/03/2020 1545   PROT 8.8 (H) 08/31/2013 1515   ALBUMIN 2.6 (L) 03/03/2020 1545   ALBUMIN 3.7 08/31/2013 1515   AST 23 03/03/2020 1545   AST 22 02/17/2020 0824   AST 25 08/31/2013 1515   ALT 12 03/03/2020 1545   ALT 8 02/17/2020 0824   ALT 9 08/31/2013 1515   ALKPHOS 73 03/03/2020 1545   ALKPHOS 99 08/31/2013 1515   BILITOT 0.7 03/03/2020 1545   BILITOT 0.7 02/17/2020 0824   BILITOT 0.67 08/31/2013 1515   GFRNONAA >60 03/05/2020 0541   GFRNONAA >60 02/17/2020 0824   GFRNONAA 67 08/08/2019 1535   GFRAA >60 02/17/2020 0824   GFRAA 78 08/08/2019 1535   Lipase     Component Value Date/Time   LIPASE 25 11/02/2019 0030       Studies/Results: No results found.  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 - patient is not currently obstructed, no emergency surgical intervention indicated - would consider fecal diversion to alleviate pain, but this would mean a sigmoid loop colostomy and patient is unsure currently if she would want this  - if patient elects non-operative management would recommend colace and miralax to help control pain related to defecation  - ONC to see today as well, await  further input   FEN: FLD VTE: SCDs ID: HART therapy, no current abx  LOS: 2 days    Norm Parcel , [facility] 03/05/2020, 8:50 AM Please see Amion for pager number during day hours 7:00am-4:30pm

## 2020-03-05 NOTE — Progress Notes (Signed)
Discussed patient cancer disease, HIV with low CD4 count and abdominal pain with the patient's daughter as well as possibilities going forward including diversion, palliative consult, further chemo.

## 2020-03-05 NOTE — Progress Notes (Signed)
Initial Nutrition Assessment  DOCUMENTATION CODES:   Severe malnutrition in context of chronic illness  INTERVENTION:   -Boost Plus TID- Each supplement provides 360kcal and 14g protein.    NUTRITION DIAGNOSIS:   Severe Malnutrition related to chronic illness, cancer and cancer related treatments as evidenced by percent weight loss, severe fat depletion, severe muscle depletion.  GOAL:   Patient will meet greater than or equal to 90% of their needs  MONITOR:   Supplement acceptance, PO intake, Labs, Weight trends, I & O's  REASON FOR ASSESSMENT:   Malnutrition Screening Tool    ASSESSMENT:   69 y.o. patient withH/o HTN, HIV (last CD4 84, RNA VL 26 by labs 11/08/2019), hepatitis C s/p treatment 2018,anal squamous cell carcinoma currently on treatment with Keytruda, admitted for management of anal squamous cell carcinoma with necrotic anorectal mass  Patient in room, currently eating some grits and is drinking Ensure. States this is the most solid food she has eaten in 2 weeks. Pt has been able to drink liquids and was trying to drink 2-3 Boosts a day at home. States she prefers Boost over Ensure, will switch order. Pt denies other nutrition impact symptoms.   Per weight records, pt has lost 28 lbs since 8/7 (18% wt loss x 2.5 months, significant for time frame).  Medications: Lactated ringers Labs reviewed: Low Na  NUTRITION - FOCUSED PHYSICAL EXAM:    Most Recent Value  Orbital Region Mild depletion  Upper Arm Region Severe depletion  Thoracic and Lumbar Region Unable to assess  Buccal Region Moderate depletion  Temple Region Moderate depletion  Clavicle Bone Region Severe depletion  Clavicle and Acromion Bone Region Severe depletion  Scapular Bone Region Moderate depletion  Dorsal Hand Moderate depletion  Patellar Region Unable to assess  Anterior Thigh Region Unable to assess  Posterior Calf Region Unable to assess  Edema (RD Assessment) None       Diet  Order:   Diet Order            Diet full liquid Room service appropriate? Yes; Fluid consistency: Thin  Diet effective now                 EDUCATION NEEDS:   No education needs have been identified at this time  Skin:  Skin Assessment: Reviewed RN Assessment  Last BM:  10/17 -type 6  Height:   Ht Readings from Last 1 Encounters:  03/03/20 5' (1.524 m)    Weight:   Wt Readings from Last 1 Encounters:  03/03/20 57.4 kg   BMI:  Body mass index is 24.72 kg/m.  Estimated Nutritional Needs:   Kcal:  1700-1900  Protein:  80-90g  Fluid:  1.9L/day   Clayton Bibles, MS, RD, LDN Inpatient Clinical Dietitian Contact information available via Amion

## 2020-03-05 NOTE — Progress Notes (Signed)
PROGRESS NOTE    Vanessa Beard  OJJ:[id number] DOB: 05-Mar-1951 DOA: 03/03/2020 PCP: Seward Carol, MD   Chief Complain: Rectal bleeding, pain  Brief Narrative:  Patient is a 69 year old female with history of hypertension, HIV, hepatitis C status post treatment in 2018, anal squamous cell carcinoma currently on Keytruda who presented with rectal bleeding and pain.  She was found to have necrotic anorectal mass.  General surgery and oncology following.  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  She presented with rectal pain, bleeding.  Found to have local progression of disease.  Continue pain management and supportive care.  General surgery also following.  Patient is denying further chemotherapy but she is interested on exploring surgical options.  General surgery contemplating fecal diversion with sigmoid loop colostomy for palliative approach and is discussing with patient.  Normocytic anemia: From chronic blood loss from anal cancer.  Currently hemoglobin stable.  HIV: CD4 of 76.  Not currently on PJP prophylaxis due to intolerance to Bactrim, dapsone.  Follows with ID.    Hypertension: Blood pressure soft.  Antihypertensives are on hold.  Monitor blood pressure  Hyponatremia: Improved with IV fluids.  Nutrition Problem: Severe Malnutrition Etiology: chronic illness, cancer and cancer related treatments      DVT prophylaxis:SCD Code Status: DnR Family Communication: None at bedside Status is: Inpatient  Remains inpatient appropriate because:Inpatient level of care appropriate due to severity of illness   Dispo: The patient is from: Home              Anticipated d/c is to: Home              Anticipated d/c date is: > 3 days               Patient currently is not medically stable to d/c.    Consultants: Oncology, surgery  Procedures: None  Antimicrobials:  Anti-infectives (From admission, onward)   Start     Dose/Rate Route Frequency Ordered Stop   03/03/20 2130  abacavir-dolutegravir-lamiVUDine (TRIUMEQ) [id number] MG per tablet 1 tablet        1 tablet Oral Daily 03/03/20 2000        Subjective:  Patient seen and examined at the bedside this afternoon.  Comfortable.  No diarrhea stable.  Complains of some rectal pain but no bleeding.  Denies any abdomen pain, nausea or vomiting.  She says she wants to discuss with her daughter before deciding for surgery.  Objective: Vitals:   03/04/20 1257 03/04/20 1740 03/04/20 2108 03/05/20 0540  BP: 100/84 113/83 100/73 113/82  Pulse: 81 73 75 84  Resp: 18 17 15 16   Temp: 98 F (36.7 C) 98.4 F (36.9 C) 98.8 F (37.1 C) 98.4 F (36.9 C)  TempSrc: Oral Oral Oral Oral  SpO2: 93% 99% 95% 95%  Weight:      Height:        Intake/Output Summary (Last 24 hours) at 03/05/2020 1240 Last data filed at 03/05/2020 0600 Gross per 24 hour  Intake 1438.05 ml  Output 100 ml  Net 1338.05 ml   Filed Weights   03/03/20 1529  Weight: 57.4 kg    Examination:  General exam: Appears calm and comfortable ,Not in distress,average built HEENT:PERRL,Oral mucosa moist, Ear/Nose normal on  gross exam Respiratory system: Bilateral equal air entry, normal vesicular breath sounds, no wheezes or crackles  Cardiovascular system: S1 & S2 heard, RRR. No JVD, murmurs, rubs, gallops or clicks. No pedal edema. Gastrointestinal system: Abdomen is nondistended, soft and nontender. No organomegaly or masses felt. Normal bowel sounds heard. Central nervous system: Alert and oriented. No focal neurological deficits. Extremities: No edema, no clubbing ,no cyanosis Skin: No rashes, lesions or ulcers,no icterus ,no pallor   Data Reviewed: I have personally reviewed following labs and imaging  studies  CBC: Recent Labs  Lab 03/01/20 1208 03/03/20 1545 03/04/20 0430 03/05/20 0541  WBC 7.7 7.8 6.2 6.7  NEUTROABS  --   --   --  5.3  HGB 9.3* 9.7* 8.3* 9.2*  HCT 28.3* 30.7* 25.7* 28.3*  MCV 95.6 96.8 97.3 95.3  PLT 227 274 190 254   Basic Metabolic Panel: Recent Labs  Lab 03/01/20 1208 03/03/20 1545 03/04/20 0430 03/05/20 0541  NA 131* 131* 132* 132*  K 3.9 4.2 4.0 3.9  CL 96* 98 101 101  CO2 25 24 25 24   GLUCOSE 98 123* 89 87  BUN 12 12 11 9   CREATININE 0.75 0.76 0.59 0.59  CALCIUM 10.0 10.3 9.4 9.4   GFR: Estimated Creatinine Clearance: 52 mL/min (by C-G formula based on SCr of 0.59 mg/dL). Liver Function Tests: Recent Labs  Lab 03/01/20 1208 03/03/20 1545  AST 24 23  ALT 11 12  ALKPHOS 79 73  BILITOT 1.1 0.7  PROT 7.4 7.5  ALBUMIN 2.5* 2.6*   No results for input(s): LIPASE, AMYLASE in the last 168 hours. No results for input(s): AMMONIA in the last 168 hours. Coagulation Profile: No results for input(s): INR, PROTIME in the last 168 hours. Cardiac Enzymes: No results for input(s): CKTOTAL, CKMB, CKMBINDEX, TROPONINI in the last 168 hours. BNP (last 3 results) No results for input(s): PROBNP in the last 8760 hours. HbA1C: No results for input(s): HGBA1C in the last 72 hours. CBG: No results for input(s): GLUCAP in the last 168 hours. Lipid Profile: No results for input(s): CHOL, HDL, LDLCALC, TRIG, CHOLHDL, LDLDIRECT in the last 72 hours. Thyroid Function Tests: No results for input(s): TSH, T4TOTAL, FREET4, T3FREE, THYROIDAB in the last 72 hours. Anemia Panel: No results for input(s): VITAMINB12, FOLATE, FERRITIN, TIBC, IRON, RETICCTPCT in the last 72 hours. Sepsis Labs: Recent Labs  Lab 03/03/20 1628  LATICACIDVEN 1.8    Recent Results (from the past 240 hour(s))  Urine culture     Status: Abnormal   Collection Time: 02/26/20  5:13 PM   Specimen: Urine, Random  Result Value Ref Range Status   Specimen Description   Final     URINE, RANDOM Performed at [facility] [address], [place], [place] [address]    Special Requests   Final    NONE Performed at [facility], [place] [address] [place], [place] [address]    Culture MULTIPLE SPECIES PRESENT, SUGGEST RECOLLECTION (A)  Final   Report Status 02/27/2020 FINAL  Final  Urine culture     Status: Abnormal   Collection Time: 03/01/20  2:13 PM   Specimen: Urine, Random  Result Value Ref Range Status   Specimen Description   Final    URINE, RANDOM Performed at [facility] [address], [place], [place] [address]    Special Requests   Final    NONE Performed at [facility], [place] [address] [place], [place] [address]  Culture (A)  Final    >=100,000 COLONIES/mL MULTIPLE SPECIES PRESENT, SUGGEST RECOLLECTION   Report Status 03/03/2020 FINAL  Final  Culture, blood (routine x 2)     Status: None (Preliminary result)   Collection Time: 03/03/20  4:29 PM   Specimen: BLOOD  Result Value Ref Range Status   Specimen Description   Final    BLOOD RIGHT ANTECUBITAL Performed at [facility], [place] [address], [place], [place] [address]    Special Requests   Final    BOTTLES DRAWN AEROBIC AND ANAEROBIC Blood Culture adequate volume Performed at [place] [address], [place], [place] [address]    Culture   Final    NO GROWTH 1 DAY Performed at [facility], [place] [address], [place], [place] [address]    Report Status PENDING  Incomplete  Respiratory Panel by RT PCR (Flu A&B, Covid) - Nasopharyngeal Swab     Status: None   Collection Time: 03/03/20  6:09 PM   Specimen: Nasopharyngeal Swab  Result Value Ref Range Status   SARS Coronavirus 2 by RT PCR NEGATIVE NEGATIVE Final    Comment: (NOTE) SARS-CoV-2 target nucleic acids are NOT DETECTED.  The SARS-CoV-2 RNA is generally detectable in upper respiratoy specimens during the  acute phase of infection. The lowest concentration of SARS-CoV-2 viral copies this assay can detect is 131 copies/mL. A negative result does not preclude SARS-Cov-2 infection and should not be used as the sole basis for treatment or other patient management decisions. A negative result may occur with  improper specimen collection/handling, submission of specimen other than nasopharyngeal swab, presence of viral mutation(s) within the areas targeted by this assay, and inadequate number of viral copies (<131 copies/mL). A negative result must be combined with clinical observations, patient history, and epidemiological information. The expected result is Negative.  Fact Sheet for Patients:  PinkCheek.be  Fact Sheet for Healthcare Providers:  GravelBags.it  This test is no t yet approved or cleared by the [place] FDA and  has been authorized for detection and/or diagnosis of SARS-CoV-2 by FDA under an Emergency Use Authorization (EUA). This EUA will remain  in effect (meaning this test can be used) for the duration of the COVID-19 declaration under Section 564(b)(1) of the Act, 21 U.S.C. section 360bbb-3(b)(1), unless the authorization is terminated or revoked sooner.     Influenza A by PCR NEGATIVE NEGATIVE Final   Influenza B by PCR NEGATIVE NEGATIVE Final    Comment: (NOTE) The Xpert Xpress SARS-CoV-2/FLU/RSV assay is intended as an aid in  the diagnosis of influenza from Nasopharyngeal swab specimens and  should not be used as a sole basis for treatment. Nasal washings and  aspirates are unacceptable for Xpert Xpress SARS-CoV-2/FLU/RSV  testing.  Fact Sheet for Patients: PinkCheek.be  Fact Sheet for Healthcare Providers: GravelBags.it  This test is not yet approved or cleared by the [place] FDA and  has been authorized for detection and/or diagnosis  of SARS-CoV-2 by  FDA under an Emergency Use Authorization (EUA). This EUA will remain  in effect (meaning this test can be used) for the duration of the  Covid-19 declaration under Section 564(b)(1) of the Act, 21  U.S.C. section 360bbb-3(b)(1), unless the authorization is  terminated or revoked. Performed at [facility], [place] [address] [place], [place] [address]          Radiology Studies: No results found.      Scheduled Meds: . abacavir-dolutegravir-lamiVUDine  1 tablet Oral Daily  . DULoxetine  20 mg Oral Daily  . gabapentin  300 mg Oral BID  . lactose free nutrition  237 mL Oral TID WC   Continuous Infusions: . lactated ringers 75 mL/hr at 03/05/20 0136     LOS: 2 days    Time spent: 25 mins,More than 50% of that time was spent in counseling and/or coordination of care.      Shelly Coss, MD [facility] P10/18/2021, 12:40 PM

## 2020-03-05 NOTE — Progress Notes (Addendum)
HEMATOLOGY-ONCOLOGY PROGRESS NOTE  SUBJECTIVE: Vanessa Beard is well-known to our practice.  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  She has been receiving Keytruda.  She was developing worsening rectal pain and had a repeat CT scan which showed an irregular rim enhancing soft tissue mass at the anal canal measuring 5.4 x 2.4 cm with necrosis.  She was also noted to have an ulcerated mass at the anal verge which likely represents disease progression.  We discussed options with her at her last visit including changing to systemic therapy with chemotherapy such as FOLFOX versus surgical resection if she is a surgical candidate.  The patient was not interested in any chemotherapy.  She wanted to be seen by surgery at [facility] and was referred.  She has not yet had an appointment with them.  She was subsequently admitted over the weekend due to worsening rectal pain and bleeding.  When seen this morning, she reports that her bleeding is stopped.  She continues to have pencil thin stools.  Reports mild nausea but no vomiting.  She is not having significant abdominal pain today.  Oncology History Overview Note  Cancer Staging Anal cancer ([place]) Staging form: Anus, AJCC 8th Edition - Clinical: Stage IV (cT2, cN0, cM1) - Signed by Vanessa Merle, MD on 08/12/2019    Anal cancer ([place])  07/19/2019 Procedure   Colonoscopy by Dr Havery Moros 07/19/19  IMPRESSION - Ulcer noted at the anal canal in posterior midline canal that extends into the distal rectum on digital rectal exam, nodular and somewhat hard to palpation. Endoscopic images show nodular tissue in the area, concerning for malignant ulcer. - The examined portion of the ileum was normal. - One 3 mm polyp at the hepatic flexure, removed with a cold snare. Resected and retrieved. - One 5 mm polyp in the transverse colon, removed with a cold snare. Resected and retrieved. - Diverticulosis in the ascending colon. - The examination was otherwise  normal.   07/19/2019 Initial Biopsy   Diagnosis 07/19/19 1. Transverse Colon Polyp, hepatic flexure (2) - TUBULAR ADENOMA (1 OF 3 FRAGMENTS) - BENIGN COLONIC MUCOSA (2 OF 3 FRAGMENTS) - NO HIGH GRADE DYSPLASIA OR MALIGNANCY IDENTIFIED 2. Rectum, biopsy, distal rectal anal canal - SQUAMOUS CELL CARCINOMA - SEE COMMENT   07/26/2019 Initial Diagnosis   Anal cancer ([place])   07/26/2019 Cancer Staging   Staging form: Anus, AJCC 8th Edition - Clinical: Stage IV (cT2, cN1c, cM1) - Signed by Vanessa Merle, MD on 08/12/2019   08/04/2019 PET scan   IMPRESSION: Hypermetabolic anal soft tissue mass, consistent with known primary anal carcinoma.   Sub-cm hypermetabolic lymph nodes in posterior perirectal space, right inguinal region, right iliac chain, and gastrohepatic ligament, suspicious for metastatic disease.   Small hypermetabolic left axillary lymph nodes. This would be unusual location for metastatic anal carcinoma. Recommend clinical correlation for possibility the patient has had recent COVID vaccination in the left arm, which could explain this finding.   Multifocal airspace disease in both lower lungs with marked hypermetabolic activity. This favors infectious or inflammatory etiology, and is not typical for pulmonary metastases. Short-term follow-up by chest CT is recommended.   08/15/2019 - 10/05/2019 Radiation Therapy   Concurrent ChemoRT with Dr. Lisbeth Renshaw starting 08/15/19-10/05/19   08/15/2019 - 09/12/2019 Chemotherapy   Concurrent ChemoRT with Mitomycin and 5FU on week 1 and 5 starting 08/15/19 with week 5 dose on 09/12/19.    11/02/2019 Imaging   CT CAP w contrast  IMPRESSION: No acute findings  in the chest, abdomen or pelvis.   Biapical and bibasilar scarring. Bronchiectasis and paraseptal emphysema in the lung bases.   Tortuous aorta.   Small umbilical hernia containing fat.   01/05/2020 Imaging   PET SCAN IMPRESSION: 1. Partial metabolic response. Persistent anal mass  hypermetabolism, decreased. No residual hypermetabolic nodal metastases in the abdomen or pelvis. Left axillary hypermetabolic lymph nodes are decreased in size and metabolism. No new or progressive hypermetabolic metastatic disease. 2. Hypermetabolism associated with patchy consolidative airspace opacities in the mid to lower lungs bilaterally, decreased in extent and metabolism, favoring resolving inflammatory opacities. 3. Aortic Atherosclerosis (ICD10-I70.0) and Emphysema (ICD10-J43.9).   01/27/2020 -  Chemotherapy   The patient had pembrolizumab (KEYTRUDA) 200 mg in sodium chloride 0.9 % 50 mL chemo infusion, 200 mg, Intravenous, Once, 2 of 6 cycles Administration: 200 mg (01/27/2020), 200 mg (02/17/2020)  for chemotherapy treatment.       REVIEW OF SYSTEMS:   Constitutional: Denies fevers, chills  Eyes: Denies blurriness of vision Ears, nose, mouth, throat, and face: Denies mucositis or sore throat Respiratory: Denies cough, dyspnea or wheezes Cardiovascular: Denies palpitation, chest discomfort Gastrointestinal: Reports rectal bleeding has resolved.  She has some mild nausea but no vomiting.  She continues to have rectal pain but no abdominal pain. Skin: Denies abnormal skin rashes Lymphatics: Denies new lymphadenopathy or easy bruising Neurological:Denies numbness, tingling or new weaknesses Behavioral/Psych: Mood is stable, no new changes  Extremities: No lower extremity edema All other systems were reviewed with the patient and are negative.  I have reviewed the past medical history, past surgical history, social history and family history with the patient and they are unchanged from previous note.   PHYSICAL EXAMINATION: ECOG PERFORMANCE STATUS: 3 - Symptomatic, >50% confined to bed  Vitals:   03/04/20 2108 03/05/20 0540  BP: 100/73 113/82  Pulse: 75 84  Resp: 15 16  Temp: 98.8 F (37.1 C) 98.4 F (36.9 C)  SpO2: 95% 95%   Filed Weights   03/03/20 1529   Weight: 57.4 kg    Intake/Output from previous day: 10/17 0701 - 10/18 0700 In: 2438.1 [P.O.:240; I.V.:2198.1] Out: 100 [Urine:100]  GENERAL:alert, no distress and comfortable SKIN: skin color, texture, turgor are normal, no rashes or significant lesions EYES: normal, Conjunctiva are pink and non-injected, sclera clear OROPHARYNX:no exudate, no erythema and lips, buccal mucosa, and tongue normal  LUNGS: clear to auscultation and percussion with normal breathing effort HEART: regular rate & rhythm and no murmurs and no lower extremity edema ABDOMEN:abdomen soft, non-tender and normal bowel sounds RECTAL: Ulcerated mass at the anal verge Musculoskeletal:no cyanosis of digits and no clubbing  NEURO: alert & oriented x 3 with fluent speech, no focal motor/sensory deficits  LABORATORY DATA:  I have reviewed the data as listed CMP Latest Ref Rng & Units 03/05/2020 03/04/2020 03/03/2020  Glucose 70 - 99 mg/dL 87 89 123(H)  BUN 8 - 23 mg/dL 9 11 12   Creatinine 0.44 - 1.00 mg/dL 0.59 0.59 0.76  Sodium 135 - 145 mmol/L 132(L) 132(L) 131(L)  Potassium 3.5 - 5.1 mmol/L 3.9 4.0 4.2  Chloride 98 - 111 mmol/L 101 101 98  CO2 22 - 32 mmol/L 24 25 24   Calcium 8.9 - 10.3 mg/dL 9.4 9.4 10.3  Total Protein 6.5 - 8.1 g/dL - - 7.5  Total Bilirubin 0.3 - 1.2 mg/dL - - 0.7  Alkaline Phos 38 - 126 U/L - - 73  AST 15 - 41 U/L - - 23  ALT 0 -  44 U/L - - 12    Lab Results  Component Value Date   WBC 6.7 03/05/2020   HGB 9.2 (L) 03/05/2020   HCT 28.3 (L) 03/05/2020   MCV 95.3 03/05/2020   PLT 189 03/05/2020   NEUTROABS 5.3 03/05/2020    CT ABDOMEN PELVIS W CONTRAST  Result Date: 03/01/2020 CLINICAL DATA:  Colorectal cancer, new anal mass, difficulty voiding EXAM: CT ABDOMEN AND PELVIS WITH CONTRAST TECHNIQUE: Multidetector CT imaging of the abdomen and pelvis was performed using the standard protocol following bolus administration of intravenous contrast. CONTRAST:  181mL OMNIPAQUE IOHEXOL 300  MG/ML  SOLN COMPARISON:  11/02/2019 FINDINGS: Lower chest: No acute abnormality. Fibrotic scarring of the included bilateral lung bases. Hepatobiliary: No focal liver abnormality is seen. Status post cholecystectomy. Postoperative biliary dilatation. Pancreas: Unremarkable. No pancreatic ductal dilatation or surrounding inflammatory changes. Spleen: Normal in size without significant abnormality. Adrenals/Urinary Tract: Adrenal glands are unremarkable. Kidneys are normal, without renal calculi, solid lesion, or hydronephrosis. Bladder is unremarkable. Stomach/Bowel: Stomach is within normal limits. Appendix appears normal. There is an irregular, rim enhancing soft tissue mass about the anus, containing air and fluid, measuring overall approximately 5.4 x 2.4 cm (series 2, image 76). No significant change in perirectal and presacral soft tissue stranding (series 2, image 67). Vascular/Lymphatic: Scattered aortic atherosclerosis. No enlarged abdominal or pelvic lymph nodes. Reproductive: Small calcified uterine fibroids. Other: No abdominal wall hernia or abnormality. No abdominopelvic ascites. Musculoskeletal: No acute or significant osseous findings. Unchanged superior endplate deformities of T12 and L4. IMPRESSION: 1. There is an irregular, rim enhancing soft tissue mass about the anus, containing air and fluid, measuring overall approximately 5.4 x 2.4 cm. Findings most suggestive of necrotic anorectal mass. Correlate with physical exam findings. 2. Unchanged fat stranding of the perirectal and presacral soft tissues, in keeping with prior radiation therapy. 3. No evidence of lymphadenopathy or metastatic disease in the abdomen or pelvis. 4. Aortic Atherosclerosis (ICD10-I70.0). Electronically Signed   By: Eddie Candle M.D.   On: 03/01/2020 13:57    ASSESSMENT AND PLAN: 1.  Anal squamous cell carcinoma 2.  Anemia 3.  HIV 4.  History of hepatitis C 5.  Hypertension 6.  Hyperparathyroidism 7.   Osteoporosis  -The patient is now admitted for worsening anal pain.  This is due to local disease progression.  We have previously discussed her disease progression with her and offered options including changing from [facility] to systemic chemotherapy with FOLFOX versus referral to general surgery for consideration of surgical resection.  These options were again discussed with the patient and she is not interested in chemotherapy.  She would like to discuss surgical options further with general surgery.  They are currently following her.  If surgery is feasible, we recommend that she proceed with surgical resection. -Continue current pain medications. -The patient's hemoglobin is overall stable.  Monitor for now. -Continue ongoing management of her chronic medical conditions per hospitalist.   LOS: 2 days   Mikey Bussing, DNP, AGPCNP-BC, AOCNP 03/05/20  Addendum  I have seen the patient, examined her. I agree with the assessment and and plan and have edited the notes.   Pt was admitted for worsening rectal pain, mild bleeding, and mild bowel obstruction.  She has low appetite, she is having loose bowel movement with MiraLAX.  No nausea.  I appreciate surgical team's input.  I reviewed the option of chemotherapy for disease control, versus surgery (diverting colostomy, or definitive APR). Dur to her high risk of cancer  recurrence (she probably had distant node metastasis at her initial diagnosis), medical comorbidities, especially HIV, malnutrition, advanced age, definitive surgery with APR will be a challenge for her.  We also discussed the option of palliative care alone and hospice if she decides not to take any cancer treatment.  After lengthy discussion, patient agrees to try chemo, and her daughter supported her.  I also encouraged her to follow-up with Dr. Dema Severin if she wants to pursue surgery at some point or needs diverting colostomy. She will think about it.   She needs better pain  control. She was seen by Dr. Hilma Favors previously and was put on Methadone and oxycodone.  I will send a message to Dr. Hilma Favors to see if she is available to see her tomorrow, or if we can add fentanyl patch for her pain control.  She feels Dilaudid gives her the best pain control, she may need to go home with oral Dilaudid.  I will also check with IR for sacral nerve block for her pain control.   I will follow up on Wednesday if she is due in house.  Vanessa Beard  03/05/2020

## 2020-03-06 ENCOUNTER — Inpatient Hospital Stay (HOSPITAL_COMMUNITY): Payer: Medicare HMO

## 2020-03-06 ENCOUNTER — Encounter (HOSPITAL_COMMUNITY): Payer: Self-pay | Admitting: Internal Medicine

## 2020-03-06 DIAGNOSIS — C210 Malignant neoplasm of anus, unspecified: Secondary | ICD-10-CM | POA: Diagnosis not present

## 2020-03-06 MED ORDER — HYDROMORPHONE HCL 1 MG/ML IJ SOLN
1.0000 mg | INTRAMUSCULAR | Status: DC | PRN
  Administered 2020-03-06 – 2020-03-08 (×8): 1 mg via INTRAVENOUS
  Filled 2020-03-06 (×8): qty 1

## 2020-03-06 MED ORDER — POLYETHYLENE GLYCOL 3350 17 G PO PACK
17.0000 g | PACK | Freq: Every day | ORAL | Status: DC
  Administered 2020-03-06 – 2020-03-11 (×5): 17 g via ORAL
  Filled 2020-03-06 (×6): qty 1

## 2020-03-06 MED ORDER — IOHEXOL 300 MG/ML  SOLN
30.0000 mL | Freq: Once | INTRAMUSCULAR | Status: AC | PRN
  Administered 2020-03-06: 30 mL

## 2020-03-06 MED ORDER — GABAPENTIN 300 MG PO CAPS
600.0000 mg | ORAL_CAPSULE | Freq: Two times a day (BID) | ORAL | Status: DC
  Administered 2020-03-06 – 2020-03-11 (×10): 600 mg via ORAL
  Filled 2020-03-06 (×10): qty 2

## 2020-03-06 MED ORDER — BUPIVACAINE HCL (PF) 0.5 % IJ SOLN
INTRAMUSCULAR | Status: AC
  Administered 2020-03-06: 80 mg via INTRA_ARTICULAR
  Filled 2020-03-06: qty 30

## 2020-03-06 MED ORDER — DOCUSATE SODIUM 100 MG PO CAPS: 200.0000 mg | ORAL_CAPSULE | Freq: Two times a day (BID) | ORAL | Status: DC

## 2020-03-06 MED ORDER — TRIAMCINOLONE ACETONIDE 40 MG/ML IJ SUSP (RADIOLOGY)
40.0000 mg | Freq: Once | INTRAMUSCULAR | Status: AC
  Administered 2020-03-06: 40 mg via INTRA_ARTICULAR

## 2020-03-06 MED ORDER — TRIAMCINOLONE ACETONIDE 40 MG/ML IJ SUSP (RADIOLOGY)
40.0000 mg | Freq: Once | INTRAMUSCULAR | Status: AC
  Administered 2020-03-06: 40 mg via INTRA_ARTICULAR
  Filled 2020-03-06 (×2): qty 1

## 2020-03-06 MED ORDER — FENTANYL 25 MCG/HR TD PT72
1.0000 | MEDICATED_PATCH | TRANSDERMAL | Status: DC
  Administered 2020-03-06: 1 via TRANSDERMAL
  Filled 2020-03-06: qty 1

## 2020-03-06 MED ORDER — LIDOCAINE HCL (PF) 1 % IJ SOLN
INTRAMUSCULAR | Status: AC | PRN
  Administered 2020-03-06: 10 mL via INTRADERMAL

## 2020-03-06 MED ORDER — SENNOSIDES-DOCUSATE SODIUM 8.6-50 MG PO TABS
1.0000 | ORAL_TABLET | Freq: Two times a day (BID) | ORAL | Status: DC
  Administered 2020-03-06 – 2020-03-11 (×8): 1 via ORAL
  Filled 2020-03-06 (×10): qty 1

## 2020-03-06 NOTE — Procedures (Signed)
Interventional Radiology Procedure Note  Procedure:  CT guided transgluteal needle placement for bilateral pudendal nerve block for intractable pelvic pain related to anal cancer. .  40mg  kenalog and 8cc 0.5% bupivacaine bilateral.    Complications: None Recommendations:  - Routine wound care - Do not submerge for 7 days  Signed,  Dulcy Fanny. Earleen Newport, DO

## 2020-03-06 NOTE — Care Management Important Message (Signed)
Important Message  Patient Details IM Letter given to the Patient Name: AMERIKA NOURSE MRN: [id number] Date of Birth: March 06, 1951   Medicare Important Message Given:  Yes     [name] 03/06/2020, 10:23 AM

## 2020-03-06 NOTE — Progress Notes (Signed)
PROGRESS NOTE    Vanessa Beard  ZJI:[id number] DOB: [birth year] DOA: 03/03/2020 PCP: Seward Carol, MD   Chief Complain: Rectal bleeding, pain  Brief Narrative:  Patient is a 69 year old female with history of hypertension, HIV, hepatitis C status post treatment in 2018, anal squamous cell carcinoma currently on Keytruda who presented with rectal bleeding and pain.  She was found to have necrotic anorectal mass.  General surgery and oncology following.  After discussion, patient wanted to go for conservative management/wants to avoid surgery and wants to continue chemotherapy as an outpatient.  Palliative care consulted for pain management and IR did CT-guided nerve block.  Assessment & Plan:   Principal Problem:   Anal cancer ([place]) Active Problems:   Human immunodeficiency virus (HIV) disease ([place])   Essential hypertension   Anemia   Protein-calorie malnutrition, severe  Anal squamous cell carcinoma with necrotic anorectal mass: She was following with oncology and was on Keytruda.  She presented with rectal pain, bleeding.  Found to have local progression of disease.  Continue pain management and supportive care.  General surgery also following.  General surgery considered  fecal diversion with sigmoid loop colostomy for palliative approach but patient doesn't prefer surgery. Oncology is planning to continue chemotherapy as an outpatient. We also requested palliative care evaluation for pain management. IR did CT-guided nerve block on 10.19.21. Goal is  to control her pain better. We will also request a physical therapy evaluation.  Normocytic anemia: From chronic blood loss from anal cancer.  Currently hemoglobin stable.  HIV: CD4 of 76.  Not currently on PJP prophylaxis due to intolerance to Bactrim, dapsone.  Follows with ID.    Hypertension: Blood pressure soft.  Antihypertensives are on hold.  Monitor blood pressure  Hyponatremia: Improved with IV fluids.  Nutrition  Problem: Severe Malnutrition Etiology: chronic illness, cancer and cancer related treatments      DVT prophylaxis:SCD Code Status: DnR Family Communication: Discussed with daughter on phone on 03/05/2020 Status is: Inpatient  Remains inpatient appropriate because:Inpatient level of care appropriate due to severity of illness   Dispo: The patient is from: Home              Anticipated d/c is to: Home              Anticipated d/c date is: 1-2 says              Patient currently is not medically stable to d/c. Still complaining of significant pain   Consultants: Oncology, surgery  Procedures: None  Antimicrobials:  Anti-infectives (From admission, onward)   Start     Dose/Rate Route Frequency Ordered Stop   03/03/20 2130  abacavir-dolutegravir-lamiVUDine (TRIUMEQ) 175-10-258 MG per tablet 1 tablet        1 tablet Oral Daily 03/03/20 2000        Subjective:  Patient seen and examined at the bedside this morning.  Hemodynamically stable.  Anal pain is  still there.  Had a bowel movement this morning.  Denies any other complaints  Objective: Vitals:   03/05/20 0540 03/05/20 1453 03/05/20 2108 03/06/20 0528  BP: 113/82 95/66 112/74 105/78  Pulse: 84 69 86 95  Resp: 16 16 16 16   Temp: 98.4 F (36.9 C) 98.3 F (36.8 C) 98.2 F (36.8 C) 98.5 F (36.9 C)  TempSrc: Oral Oral Oral Oral  SpO2: 95% 98% 100% 96%  Weight:      Height:        Intake/Output Summary (Last 24  hours) at 03/06/2020 8032 Last data filed at 03/05/2020 1100 Gross per 24 hour  Intake 240 ml  Output --  Net 240 ml   Filed Weights   03/03/20 1529  Weight: 57.4 kg    Examination:  General exam: Not in distress,average built HEENT:PERRL,Oral mucosa moist, Ear/Nose normal on gross exam Respiratory system: Bilateral equal air entry, normal vesicular breath sounds, no wheezes or crackles  Cardiovascular system: S1 & S2 heard, RRR. No JVD, murmurs, rubs, gallops or clicks. Gastrointestinal  system: Abdomen is nondistended, soft and nontender. No organomegaly or masses felt. Normal bowel sounds heard. Central nervous system: Alert and oriented. No focal neurological deficits. Extremities: No edema, no clubbing ,no cyanosis Skin: No rashes, lesions or ulcers,no icterus ,no pallor    Data Reviewed: I have personally reviewed following labs and imaging studies  CBC: Recent Labs  Lab 03/01/20 1208 03/03/20 1545 03/04/20 0430 03/05/20 0541  WBC 7.7 7.8 6.2 6.7  NEUTROABS  --   --   --  5.3  HGB 9.3* 9.7* 8.3* 9.2*  HCT 28.3* 30.7* 25.7* 28.3*  MCV 95.6 96.8 97.3 95.3  PLT 227 274 190 122   Basic Metabolic Panel: Recent Labs  Lab 03/01/20 1208 03/03/20 1545 03/04/20 0430 03/05/20 0541  NA 131* 131* 132* 132*  K 3.9 4.2 4.0 3.9  CL 96* 98 101 101  CO2 25 24 25 24   GLUCOSE 98 123* 89 87  BUN 12 12 11 9   CREATININE 0.75 0.76 0.59 0.59  CALCIUM 10.0 10.3 9.4 9.4   GFR: Estimated Creatinine Clearance: 52 mL/min (by C-G formula based on SCr of 0.59 mg/dL). Liver Function Tests: Recent Labs  Lab 03/01/20 1208 03/03/20 1545  AST 24 23  ALT 11 12  ALKPHOS 79 73  BILITOT 1.1 0.7  PROT 7.4 7.5  ALBUMIN 2.5* 2.6*   No results for input(s): LIPASE, AMYLASE in the last 168 hours. No results for input(s): AMMONIA in the last 168 hours. Coagulation Profile: No results for input(s): INR, PROTIME in the last 168 hours. Cardiac Enzymes: No results for input(s): CKTOTAL, CKMB, CKMBINDEX, TROPONINI in the last 168 hours. BNP (last 3 results) No results for input(s): PROBNP in the last 8760 hours. HbA1C: No results for input(s): HGBA1C in the last 72 hours. CBG: No results for input(s): GLUCAP in the last 168 hours. Lipid Profile: No results for input(s): CHOL, HDL, LDLCALC, TRIG, CHOLHDL, LDLDIRECT in the last 72 hours. Thyroid Function Tests: No results for input(s): TSH, T4TOTAL, FREET4, T3FREE, THYROIDAB in the last 72 hours. Anemia Panel: No results for  input(s): VITAMINB12, FOLATE, FERRITIN, TIBC, IRON, RETICCTPCT in the last 72 hours. Sepsis Labs: Recent Labs  Lab 03/03/20 1628  LATICACIDVEN 1.8    Recent Results (from the past 240 hour(s))  Urine culture     Status: Abnormal   Collection Time: 02/26/20  5:13 PM   Specimen: Urine, Random  Result Value Ref Range Status   Specimen Description   Final    URINE, RANDOM Performed at [facility] [address], [place], [place] [address]    Special Requests   Final    NONE Performed at [facility], [place] [address] [place], [place] [address]    Culture MULTIPLE SPECIES PRESENT, SUGGEST RECOLLECTION (A)  Final   Report Status 02/27/2020 FINAL  Final  Urine culture     Status: Abnormal   Collection Time: 03/01/20  2:13 PM   Specimen: Urine, Random  Result Value Ref  Range Status   Specimen Description   Final    URINE, RANDOM Performed at [facility], [place] [address], [place], [place] [address]    Special Requests   Final    NONE Performed at [facility], [place] [address], [place], [place] [address]    Culture (A)  Final    >=100,000 COLONIES/mL MULTIPLE SPECIES PRESENT, SUGGEST RECOLLECTION   Report Status 03/03/2020 FINAL  Final  Culture, blood (routine x 2)     Status: None (Preliminary result)   Collection Time: 03/03/20  4:29 PM   Specimen: BLOOD  Result Value Ref Range Status   Specimen Description   Final    BLOOD RIGHT ANTECUBITAL Performed at [facility] [address] [place], [place] [address]    Special Requests   Final    BOTTLES DRAWN AEROBIC AND ANAEROBIC Blood Culture adequate volume Performed at [facility] [address], [place], [place] [address]    Culture   Final    NO GROWTH 1 DAY Performed at [facility], [place] [address], [place], [place] [address]    Report Status PENDING  Incomplete  Respiratory Panel by RT PCR (Flu  A&B, Covid) - Nasopharyngeal Swab     Status: None   Collection Time: 03/03/20  6:09 PM   Specimen: Nasopharyngeal Swab  Result Value Ref Range Status   SARS Coronavirus 2 by RT PCR NEGATIVE NEGATIVE Final    Comment: (NOTE) SARS-CoV-2 target nucleic acids are NOT DETECTED.  The SARS-CoV-2 RNA is generally detectable in upper respiratoy specimens during the acute phase of infection. The lowest concentration of SARS-CoV-2 viral copies this assay can detect is 131 copies/mL. A negative result does not preclude SARS-Cov-2 infection and should not be used as the sole basis for treatment or other patient management decisions. A negative result may occur with  improper specimen collection/handling, submission of specimen other than nasopharyngeal swab, presence of viral mutation(s) within the areas targeted by this assay, and inadequate number of viral copies (<131 copies/mL). A negative result must be combined with clinical observations, patient history, and epidemiological information. The expected result is Negative.  Fact Sheet for Patients:  PinkCheek.be  Fact Sheet for Healthcare Providers:  GravelBags.it  This test is no t yet approved or cleared by the [place] FDA and  has been authorized for detection and/or diagnosis of SARS-CoV-2 by FDA under an Emergency Use Authorization (EUA). This EUA will remain  in effect (meaning this test can be used) for the duration of the COVID-19 declaration under Section 564(b)(1) of the Act, 21 U.S.C. section 360bbb-3(b)(1), unless the authorization is terminated or revoked sooner.     Influenza A by PCR NEGATIVE NEGATIVE Final   Influenza B by PCR NEGATIVE NEGATIVE Final    Comment: (NOTE) The Xpert Xpress SARS-CoV-2/FLU/RSV assay is intended as an aid in  the diagnosis of influenza from Nasopharyngeal swab specimens and  should not be used as a sole basis for treatment. Nasal  washings and  aspirates are unacceptable for Xpert Xpress SARS-CoV-2/FLU/RSV  testing.  Fact Sheet for Patients: PinkCheek.be  Fact Sheet for Healthcare Providers: GravelBags.it  This test is not yet approved or cleared by the [place] FDA and  has been authorized for detection and/or diagnosis of SARS-CoV-2 by  FDA under an Emergency Use Authorization (EUA). This EUA will remain  in effect (meaning this test can be used) for the duration of the  Covid-19 declaration  under Section 564(b)(1) of the Act, 21  U.S.C. section 360bbb-3(b)(1), unless the authorization is  terminated or revoked. Performed at [facility], [place] [address], [place], [place] [address]          Radiology Studies: No results found.      Scheduled Meds: . abacavir-dolutegravir-lamiVUDine  1 tablet Oral Daily  . DULoxetine  20 mg Oral Daily  . gabapentin  300 mg Oral BID  . lactose free nutrition  237 mL Oral TID WC   Continuous Infusions: . lactated ringers 75 mL/hr at 03/05/20 1455     LOS: 3 days    Time spent: 25 mins,More than 50% of that time was spent in counseling and/or coordination of care.      Shelly Coss, MD [facility] P10/19/2021, 8:21 AM

## 2020-03-07 ENCOUNTER — Other Ambulatory Visit: Payer: Self-pay

## 2020-03-07 DIAGNOSIS — C210 Malignant neoplasm of anus, unspecified: Secondary | ICD-10-CM | POA: Diagnosis not present

## 2020-03-07 MED ORDER — BUTAMBEN-TETRACAINE-BENZOCAINE 2-2-14 % EX AERO
1.0000 | INHALATION_SPRAY | CUTANEOUS | Status: DC | PRN
  Filled 2020-03-07: qty 20

## 2020-03-07 MED ORDER — DEXAMETHASONE SODIUM PHOSPHATE 10 MG/ML IJ SOLN
8.0000 mg | INTRAMUSCULAR | Status: DC
  Administered 2020-03-07 – 2020-03-08 (×2): 8 mg via INTRAVENOUS
  Filled 2020-03-07 (×2): qty 1

## 2020-03-07 NOTE — Progress Notes (Incomplete)
[name]   [phone] [phone]   Clinic Follow up Note   Patient Care Team: Seward Carol, MD as PCP - General (Internal Medicine) Carlyle Basques, MD as PCP - Infectious Diseases (Infectious Diseases) Binnie Rail, DC as Referring Physician (Chiropractic Medicine) Jonnie Finner, RN as Oncology Nurse Navigator  Date of Service:  03/07/2020  CHIEF COMPLAINT: ***  SUMMARY OF ONCOLOGIC HISTORY: Oncology History Overview Note  Cancer Staging Anal cancer ([place]) Staging form: Anus, AJCC 8th Edition - Clinical: Stage IV (cT2, cN0, cM1) - Signed by Truitt Merle, MD on 08/12/2019    Anal cancer ([place])  07/19/2019 Procedure   Colonoscopy by Dr Havery Moros 07/19/19  IMPRESSION - Ulcer noted at the anal canal in posterior midline canal that extends into the distal rectum on digital rectal exam, nodular and somewhat hard to palpation. Endoscopic images show nodular tissue in the area, concerning for malignant ulcer. - The examined portion of the ileum was normal. - One 3 mm polyp at the hepatic flexure, removed with a cold snare. Resected and retrieved. - One 5 mm polyp in the transverse colon, removed with a cold snare. Resected and retrieved. - Diverticulosis in the ascending colon. - The examination was otherwise normal.   07/19/2019 Initial Biopsy   Diagnosis 07/19/19 1. Transverse Colon Polyp, hepatic flexure (2) - TUBULAR ADENOMA (1 OF 3 FRAGMENTS) - BENIGN COLONIC MUCOSA (2 OF 3 FRAGMENTS) - NO HIGH GRADE DYSPLASIA OR MALIGNANCY IDENTIFIED 2. 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    Sub-cm hypermetabolic lymph nodes in posterior perirectal space, right inguinal region, right iliac chain, and gastrohepatic ligament, suspicious for metastatic disease.   Small hypermetabolic left axillary lymph nodes. This would be unusual location for metastatic anal carcinoma. Recommend clinical correlation for possibility the patient has had recent COVID vaccination in the left arm, which could explain this finding.   Multifocal airspace disease in both lower lungs with marked hypermetabolic activity. This favors infectious or inflammatory etiology, and is not typical for pulmonary metastases. Short-term follow-up by chest CT is recommended.   08/15/2019 - 10/05/2019 Radiation Therapy   Concurrent ChemoRT with Dr. Lisbeth Renshaw starting 08/15/19-10/05/19   08/15/2019 - 09/12/2019 Chemotherapy   Concurrent ChemoRT with Mitomycin and 5FU on week 1 and 5 starting 08/15/19 with week 5 dose on 09/12/19.    11/02/2019 Imaging   CT CAP w contrast  IMPRESSION: No acute findings in the chest, abdomen or pelvis.   Biapical and bibasilar scarring. Bronchiectasis and paraseptal emphysema in the lung bases.   Tortuous aorta.   Small umbilical hernia containing fat.   01/05/2020 Imaging   PET SCAN IMPRESSION: 1. Partial metabolic response. Persistent anal mass hypermetabolism, decreased. No residual hypermetabolic nodal metastases in the abdomen or pelvis. Left axillary hypermetabolic lymph nodes are decreased in size and metabolism. No new or progressive hypermetabolic metastatic disease. 2. Hypermetabolism associated with patchy consolidative airspace opacities in the mid to lower lungs bilaterally, decreased in extent and metabolism, favoring resolving inflammatory opacities. 3. Aortic Atherosclerosis (ICD10-I70.0) and Emphysema (ICD10-J43.9).   01/27/2020 - 02/17/2020 Chemotherapy   The patient had pembrolizumab [facility])  200 mg in sodium chloride 0.9 % 50 mL chemo infusion, 200 mg,  Intravenous, Once, 2 of 6 cycles Administration: 200 mg (01/27/2020), 200 mg (02/17/2020)  for chemotherapy treatment.    03/14/2020 -  Chemotherapy   The patient had dexamethasone (DECADRON) 4 MG tablet, 8 mg, Oral, Daily, 0 of 1 cycle, Start date: --, End date: -- palonosetron (ALOXI) injection 0.25 mg, 0.25 mg, Intravenous,  Once, 0 of 4 cycles leucovorin 624 mg in dextrose 5 % 250 mL infusion, 400 mg/m2 = 624 mg, Intravenous,  Once, 0 of 4 cycles oxaliplatin (ELOXATIN) 110 mg in dextrose 5 % 500 mL chemo infusion, 70 mg/m2 = 110 mg (100 % of original dose 70 mg/m2), Intravenous,  Once, 0 of 4 cycles Dose modification: 70 mg/m2 (original dose 70 mg/m2, Cycle 1, Reason: Provider Judgment) fluorouracil (ADRUCIL) 3,100 mg in sodium chloride 0.9 % 88 mL chemo infusion, 2,000 mg/m2 = 3,100 mg (100 % of original dose 2,000 mg/m2), Intravenous, 1 Day/Dose, 0 of 4 cycles Dose modification: 2,000 mg/m2 (original dose 2,000 mg/m2, Cycle 1, Reason: Provider Judgment)  for chemotherapy treatment.       CURRENT THERAPY:  ***  INTERVAL HISTORY: *** Vanessa Beard is here for a follow up ***  REVIEW OF SYSTEMS:  *** Constitutional: Denies fevers, chills or abnormal weight loss Eyes: Denies blurriness of vision Ears, nose, mouth, throat, and face: Denies mucositis or sore throat Respiratory: Denies cough, dyspnea or wheezes Cardiovascular: Denies palpitation, chest discomfort or lower extremity swelling Gastrointestinal:  Denies nausea, heartburn or change in bowel habits Skin: Denies abnormal skin rashes Lymphatics: Denies new lymphadenopathy or easy bruising Neurological:Denies numbness, tingling or new weaknesses Behavioral/Psych: Mood is stable, no new changes  All other systems were reviewed with the patient and are negative.  MEDICAL HISTORY:  Past Medical History:  Diagnosis Date  . Allergic rhinitis   . Biliary colic   . Cancer ([place])   . Gall bladder disease \  . Hepatitis C    . Herpes   . HIV infection ([place])   . Hyperparathyroidism ([place])   . Hypertension   . Insomnia   . Osteoporosis   . Pneumonia    2010    SURGICAL HISTORY: Past Surgical History:  Procedure Laterality Date  . CHOLECYSTECTOMY N/A 01/09/2015   Procedure: LAPAROSCOPIC CHOLECYSTECTOMY;  Surgeon: [name], MD;  Location: [facility];  Service: General;  Laterality: N/A;  . ECTOPIC PREGNANCY SURGERY    . ESOPHAGOGASTRODUODENOSCOPY (EGD) WITH PROPOFOL N/A 09/08/2019   Procedure: ESOPHAGOGASTRODUODENOSCOPY (EGD) WITH PROPOFOL;  Surgeon: Milus Banister, MD;  Location: [facility];  Service: Endoscopy;  Laterality: N/A;  . IR FLUORO GUIDE CV LINE LEFT  08/15/2019  . ORIF TIBIA PLATEAU Right 02/18/2017   Procedure: OPEN REDUCTION INTERNAL FIXATION (ORIF) RIGHT TIBIAL PLATEAU;  Surgeon: Leandrew Koyanagi, MD;  Location: [facility];  Service: Orthopedics;  Laterality: Right;  . SHOULDER SURGERY Right 12/2014  . UPPER ESOPHAGEAL ENDOSCOPIC ULTRASOUND (EUS) N/A 09/08/2019   Procedure: UPPER ESOPHAGEAL ENDOSCOPIC ULTRASOUND (EUS);  Surgeon: Milus Banister, MD;  Location: [facility];  Service: Endoscopy;  Laterality: N/A;    I have reviewed the social history and family history with the patient and they are unchanged from previous note.  ALLERGIES:  is allergic to dapsone, retrovir [zidovudine], and sulfamethoxazole-trimethoprim.  MEDICATIONS:  No current facility-administered medications for this visit.   No current outpatient medications on file.   Facility-Administered Medications Ordered in Other Visits  Medication Dose Route Frequency  Provider Last Rate Last Admin  . abacavir-dolutegravir-lamiVUDine (TRIUMEQ) [id number] MG per tablet 1 tablet  1 tablet Oral Daily Lenore Cordia, MD   1 tablet at 03/07/20 0916  . acetaminophen (TYLENOL) tablet 650 mg  650 mg Oral Q6H PRN Lenore Cordia, MD       Or  . acetaminophen (TYLENOL) suppository 650 mg  650 mg Rectal Q6H PRN Lenore Cordia, MD      .  butamben-tetracaine-benzocaine (CETACAINE) spray 1 spray  1 spray Topical PRN Lane Hacker L, DO      . dexamethasone (DECADRON) injection 8 mg  8 mg Intravenous Q24H Lane Hacker L, DO      . DULoxetine (CYMBALTA) DR capsule 20 mg  20 mg Oral Daily Lenore Cordia, MD   20 mg at 03/07/20 0915  . fentaNYL (DURAGESIC) 25 MCG/HR 1 patch  1 patch Transdermal Q72H Lane Hacker L, DO   1 patch at 03/06/20 1618  . gabapentin (NEURONTIN) capsule 600 mg  600 mg Oral BID Lane Hacker L, DO   600 mg at 03/07/20 0915  . HYDROmorphone (DILAUDID) injection 1 mg  1 mg Intravenous Q2H PRN Lane Hacker L, DO   1 mg at 03/07/20 1403  . influenza vaccine adjuvanted (FLUAD) injection 0.5 mL  0.5 mL Intramuscular Prior to discharge Shelly Coss, MD      . lactose free nutrition (BOOST PLUS) liquid 237 mL  237 mL Oral TID WC Adhikari, Amrit, MD   237 mL at 03/07/20 1651  . lip balm (CARMEX) ointment   Topical PRN Florencia Reasons, MD      . ondansetron [facility]) tablet 4 mg  4 mg Oral Q6H PRN Lenore Cordia, MD       Or  . ondansetron (ZOFRAN) injection 4 mg  4 mg Intravenous Q6H PRN Zada Finders R, MD      . polyethylene glycol (MIRALAX / GLYCOLAX) packet 17 g  17 g Oral Daily Shelly Coss, MD   17 g at 03/07/20 0914  . senna-docusate (Senokot-S) tablet 1 tablet  1 tablet Oral BID Shelly Coss, MD   1 tablet at 03/07/20 0914  . zolpidem (AMBIEN) tablet 5 mg  5 mg Oral QHS PRN Lenore Cordia, MD   5 mg at 03/04/20 2300    PHYSICAL EXAMINATION: ECOG PERFORMANCE STATUS: {CHL ONC ECOG PS:[phone]}  There were no vitals filed for this visit. There were no vitals filed for this visit. *** GENERAL:alert, no distress and comfortable SKIN: skin color, texture, turgor are normal, no rashes or significant lesions EYES: normal, Conjunctiva are pink and non-injected, sclera clear {OROPHARYNX:no exudate, no erythema and lips, buccal mucosa, and tongue normal}  NECK: supple, thyroid normal  size, non-tender, without nodularity LYMPH:  no palpable lymphadenopathy in the cervical, axillary {or inguinal} LUNGS: clear to auscultation and percussion with normal breathing effort HEART: regular rate & rhythm and no murmurs and no lower extremity edema ABDOMEN:abdomen soft, non-tender and normal bowel sounds Musculoskeletal:no cyanosis of digits and no clubbing  NEURO: alert & oriented x 3 with fluent speech, no focal motor/sensory deficits  LABORATORY DATA:  I have reviewed the data as listed CBC Latest Ref Rng & Units 03/05/2020 03/04/2020 03/03/2020  WBC 4.0 - 10.5 K/uL 6.7 6.2 7.8  Hemoglobin 12.0 - 15.0 g/dL 9.2(L) 8.3(L) 9.7(L)  Hematocrit 36 - 46 % 28.3(L) 25.7(L) 30.7(L)  Platelets 150 - 400 K/uL 189 190 274     CMP Latest Ref Rng & Units 03/05/2020  03/04/2020 03/03/2020  Glucose 70 - 99 mg/dL 87 89 123(H)  BUN 8 - 23 mg/dL 9 11 12   Creatinine 0.44 - 1.00 mg/dL 0.59 0.59 0.76  Sodium 135 - 145 mmol/L 132(L) 132(L) 131(L)  Potassium 3.5 - 5.1 mmol/L 3.9 4.0 4.2  Chloride 98 - 111 mmol/L 101 101 98  CO2 22 - 32 mmol/L 24 25 24   Calcium 8.9 - 10.3 mg/dL 9.4 9.4 10.3  Total Protein 6.5 - 8.1 g/dL - - 7.5  Total Bilirubin 0.3 - 1.2 mg/dL - - 0.7  Alkaline Phos 38 - 126 U/L - - 73  AST 15 - 41 U/L - - 23  ALT 0 - 44 U/L - - 12      RADIOGRAPHIC STUDIES: I have personally reviewed the radiological images as listed and agreed with the findings in the report. CT GUIDED NEEDLE PLACEMENT  Result Date: 03/06/2020 INDICATION: 69 year old female with anal cancer, non remaining pain, referred for pudendal nerve block EXAM: CT GUIDANCE NEEDLE PLACEMENT MEDICATIONS: None. ANESTHESIA/SEDATION: None FLUOROSCOPY TIME:  CT COMPLICATIONS: None PROCEDURE: Informed written consent was obtained from the patient after a thorough discussion of the procedural risks, benefits and alternatives. All questions were addressed. Maximal Sterile Barrier Technique was utilized including caps, mask,  sterile gowns, sterile gloves, sterile drape, hand hygiene and skin antiseptic. A timeout was performed prior to the initiation of the procedure. Patient was position prone position on the CT gantry table. Scout CT was acquired for planning purposes. The patient was then prepped and draped in the usual sterile fashion. 1% lidocaine was used for local anesthesia. Using CT guidance, bilateral transgluteal approach was performed using 22 gauge 15 cm Chiba needles, targeting the pudendal canal within the bilateral pelvis. Once we confirmed needle tip position with CT, small amount of 40% dilute contrast was injected to assure that the needle tip was not in a vascular space. We then injected a solution of 8 cc 5% bupivacaine and 1 mL (40 mg) Kenalog at both needle sites. Needles were removed and a final CT was performed. Patient tolerated the procedure well and remained hemodynamically stable throughout. No complications were encountered and no significant blood loss. IMPRESSION: Status post CT-guided needle placement at the bilateral pudendal canal for bilateral pudendal nerve block Signed, Dulcy Fanny. Dellia Nims, RPVI [facility] Radiology Electronically Signed   By: Corrie Mckusick D.O.   On: 03/06/2020 16:31   CT GUIDED NEEDLE PLACEMENT  Result Date: 03/06/2020 INDICATION: 69 year old female with anal cancer, non remaining pain, referred for pudendal nerve block EXAM: CT GUIDANCE NEEDLE PLACEMENT MEDICATIONS: None. ANESTHESIA/SEDATION: None FLUOROSCOPY TIME:  CT COMPLICATIONS: None PROCEDURE: Informed written consent was obtained from the patient after a thorough discussion of the procedural risks, benefits and alternatives. All questions were addressed. Maximal Sterile Barrier Technique was utilized including caps, mask, sterile gowns, sterile gloves, sterile drape, hand hygiene and skin antiseptic. A timeout was performed prior to the initiation of the procedure.  Patient was position prone position on the CT gantry table. Scout CT was acquired for planning purposes. The patient was then prepped and draped in the usual sterile fashion. 1% lidocaine was used for local anesthesia. Using CT guidance, bilateral transgluteal approach was performed using 22 gauge 15 cm Chiba needles, targeting the pudendal canal within the bilateral pelvis. Once we confirmed needle tip position with CT, small amount of 40% dilute contrast was injected to assure that the needle tip was not in a vascular space. We  then injected a solution of 8 cc 5% bupivacaine and 1 mL (40 mg) Kenalog at both needle sites. Needles were removed and a final CT was performed. Patient tolerated the procedure well and remained hemodynamically stable throughout. No complications were encountered and no significant blood loss. IMPRESSION: Status post CT-guided needle placement at the bilateral pudendal canal for bilateral pudendal nerve block Signed, Dulcy Fanny. Dellia Nims, RPVI [facility] Radiology Electronically Signed   By: Corrie Mckusick D.O.   On: 03/06/2020 16:31     ASSESSMENT & PLAN:  MERCEDEES CONVERY is a 69 y.o. female with      No problem-specific Assessment & Plan notes found for this encounter.   No orders of the defined types were placed in this encounter.  All questions were answered. The patient knows to call the clinic with any problems, questions or concerns. No barriers to learning was detected. The total time spent in the appointment was {CHL ONC TIME VISIT - [id number]}.     Joslyn Devon 03/07/2020   Oneal Deputy, am acting as scribe for Truitt Merle, MD.   {Add scribe attestation statement}

## 2020-03-07 NOTE — Progress Notes (Signed)
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  She was found to have necrotic anorectal mass.  General surgery and oncology following.  After discussion, patient wanted to go for conservative management/wants to avoid surgery and wants to continue chemotherapy as an outpatient.  Palliative care consulted for pain management and IR did CT-guided nerve block.  Assessment & Plan:   Principal Problem:   Anal cancer ([place]) Active Problems:   Human immunodeficiency virus (HIV) disease ([place])   Essential hypertension   Anemia   Protein-calorie malnutrition, severe  Anal squamous cell carcinoma with necrotic anorectal mass: She was following with oncology and was on Keytruda.  She presented with rectal pain, bleeding.  Found to have local progression of disease.  Continue pain management and supportive care.  General surgery also following.  General surgery considered  fecal diversion with sigmoid loop colostomy for palliative approach but patient doesn't prefer surgery. Oncology is planning to continue chemotherapy as an outpatient. We also requested palliative care evaluation for pain management. IR did CT-guided nerve block on 10.19.21. Goal is  to control her pain better. We will also request a physical therapy evaluation.  Continue bowel regimen to avoid constipation  Normocytic anemia: From chronic blood loss from anal cancer.  Currently hemoglobin stable.  HIV: CD4 of 76.  Not currently on PJP prophylaxis due to intolerance to Bactrim, dapsone.  Follows with ID.    Hypertension: Blood pressure soft.  Antihypertensives are on hold.  Monitor blood  pressure  Hyponatremia: Improved with IV fluids.  Nutrition Problem: Severe Malnutrition Etiology: chronic illness, cancer and cancer related treatments      DVT prophylaxis:SCD Code Status: DnR Family Communication: Discussed with daughter on phone on 03/05/2020 Status is: Inpatient  Remains inpatient appropriate because:Inpatient level of care appropriate due to severity of illness   Dispo: The patient is from: Home              Anticipated d/c is to: Home              Anticipated d/c date is: 1-2 says              Patient currently is not medically stable to d/c. Still complaining of significant pain requiring IV pain medications   Consultants: Oncology, surgery  Procedures: None  Antimicrobials:  Anti-infectives (From admission, onward)   Start     Dose/Rate Route Frequency Ordered Stop   03/03/20 2130  abacavir-dolutegravir-lamiVUDine (TRIUMEQ) [id number] MG per tablet 1 tablet        1 tablet Oral Daily 03/03/20 2000        Subjective:  Patient seen and examined at the bedside this morning.  She still complains of severe pain,needing IV abx.  She does not feel that she is ready to go home.  Today [birth year] her birthday  Objective: Vitals:   03/06/20 0528 03/06/20 1452 03/06/20 2124 03/07/20 0538  BP: 105/78 118/84 (!) 127/93 (!) 145/92  Pulse: 95 72 65 67  Resp: 16 15 17 17   Temp: 98.5 F (36.9 C) 97.9 F (36.6 C) 97.7 F (36.5 C) (!) 97.5 F (36.4 C)  TempSrc: Oral Oral Oral Oral  SpO2: 96% 95% 91% 96%  Weight:      Height:        Intake/Output Summary (Last 24 hours) at 03/07/2020 0806 Last data filed at 03/07/2020 0600 Gross per 24 hour  Intake 440 ml  Output 1000 ml  Net -560 ml   Filed Weights   03/03/20 1529  Weight: 57.4 kg    Examination:  General exam: Not in distress,average built HEENT:PERRL,Oral mucosa moist, Ear/Nose normal on gross exam Respiratory system: Bilateral equal air entry, normal vesicular breath sounds, no wheezes or  crackles  Cardiovascular system: S1 & S2 heard, RRR. No JVD, murmurs, rubs, gallops or clicks. Gastrointestinal system: Abdomen is nondistended, soft and nontender. No organomegaly or masses felt. Normal bowel sounds heard. Central nervous system: Alert and oriented. No focal neurological deficits. Extremities: No edema, no clubbing ,no cyanosis Skin: No rashes, lesions or ulcers,no icterus ,no pallor  Data Reviewed: I have personally reviewed following labs and imaging studies  CBC: Recent Labs  Lab 03/01/20 1208 03/03/20 1545 03/04/20 0430 03/05/20 0541  WBC 7.7 7.8 6.2 6.7  NEUTROABS  --   --   --  5.3  HGB 9.3* 9.7* 8.3* 9.2*  HCT 28.3* 30.7* 25.7* 28.3*  MCV 95.6 96.8 97.3 95.3  PLT 227 274 190 505   Basic Metabolic Panel: Recent Labs  Lab 03/01/20 1208 03/03/20 1545 03/04/20 0430 03/05/20 0541  NA 131* 131* 132* 132*  K 3.9 4.2 4.0 3.9  CL 96* 98 101 101  CO2 25 24 25 24   GLUCOSE 98 123* 89 87  BUN 12 12 11 9   CREATININE 0.75 0.76 0.59 0.59  CALCIUM 10.0 10.3 9.4 9.4   GFR: Estimated Creatinine Clearance: 51.2 mL/min (by C-G formula based on SCr of 0.59 mg/dL). Liver Function Tests: Recent Labs  Lab 03/01/20 1208 03/03/20 1545  AST 24 23  ALT 11 12  ALKPHOS 79 73  BILITOT 1.1 0.7  PROT 7.4 7.5  ALBUMIN 2.5* 2.6*   No results for input(s): LIPASE, AMYLASE in the last 168 hours. No results for input(s): AMMONIA in the last 168 hours. Coagulation Profile: No results for input(s): INR, PROTIME in the last 168 hours. Cardiac Enzymes: No results for input(s): CKTOTAL, CKMB, CKMBINDEX, TROPONINI in the last 168 hours. BNP (last 3 results) No results for input(s): PROBNP in the last 8760 hours. HbA1C: No results for input(s): HGBA1C in the last 72 hours. CBG: No results for input(s): GLUCAP in the last 168 hours. Lipid Profile: No results for input(s): CHOL, HDL, LDLCALC, TRIG, CHOLHDL, LDLDIRECT in the last 72 hours. Thyroid Function Tests: No  results for input(s): TSH, T4TOTAL, FREET4, T3FREE, THYROIDAB in the last 72 hours. Anemia Panel: No results for input(s): VITAMINB12, FOLATE, FERRITIN, TIBC, IRON, RETICCTPCT in the last 72 hours. Sepsis Labs: Recent Labs  Lab 03/03/20 1628  LATICACIDVEN 1.8    Recent Results (from the past 240 hour(s))  Urine culture     Status: Abnormal   Collection Time: 02/26/20  5:13 PM   Specimen: Urine, Random  Result Value Ref Range Status   Specimen Description   Final    URINE, RANDOM Performed at [facility] [address] [place], [place] [address]    Special Requests   Final    NONE Performed at [facility], [place] [address], [place], [place] [address]    Culture MULTIPLE SPECIES PRESENT, SUGGEST RECOLLECTION (A)  Final   Report Status 02/27/2020 FINAL  Final  Urine culture     Status: Abnormal  Collection Time: 03/01/20  2:13 PM   Specimen: Urine, Random  Result Value Ref Range Status   Specimen Description   Final    URINE, RANDOM Performed at [facility] [address] [place], [place] [address]    Special Requests   Final    NONE Performed at [facility], [place] [address] [place], [place] [address]    Culture (A)  Final    >=100,000 COLONIES/mL MULTIPLE SPECIES PRESENT, SUGGEST RECOLLECTION   Report Status 03/03/2020 FINAL  Final  Culture, blood (routine x 2)     Status: None (Preliminary result)   Collection Time: 03/03/20  4:29 PM   Specimen: BLOOD  Result Value Ref Range Status   Specimen Description   Final    BLOOD RIGHT ANTECUBITAL Performed at [facility] [address], [place], [place] [address]    Special Requests   Final    BOTTLES DRAWN AEROBIC AND ANAEROBIC Blood Culture adequate volume Performed at [facility] [address] [place], [place] [address]    Culture   Final    NO GROWTH 2 DAYS Performed at [facility] [address] [place], [place] [address]    Report Status PENDING  Incomplete  Respiratory Panel by RT PCR (Flu A&B, Covid) - Nasopharyngeal Swab     Status: None   Collection Time: 03/03/20  6:09 PM   Specimen: Nasopharyngeal Swab  Result Value Ref Range Status   SARS Coronavirus 2 by RT PCR NEGATIVE NEGATIVE Final    Comment: (NOTE) SARS-CoV-2 target nucleic acids are NOT DETECTED.  The SARS-CoV-2 RNA is generally detectable in upper respiratoy specimens during the acute phase of infection. The lowest concentration of SARS-CoV-2 viral copies this assay can detect is 131 copies/mL. A negative result does not preclude SARS-Cov-2 infection and should not be used as the sole basis for treatment or other patient management decisions. A negative result may occur with  improper specimen collection/handling, submission of specimen other than nasopharyngeal swab, presence of viral mutation(s) within the areas targeted by this assay, and inadequate number of viral copies (<131 copies/mL). A negative result must be combined with clinical observations, patient history, and epidemiological information. The expected result is Negative.  Fact Sheet for Patients:  PinkCheek.be  Fact Sheet for Healthcare Providers:  GravelBags.it  This test is no t yet approved or cleared by the [place] FDA and  has been authorized for detection and/or diagnosis of SARS-CoV-2 by FDA under an Emergency Use Authorization (EUA). This EUA will remain  in effect (meaning this test can be used) for the duration of the COVID-19 declaration under Section 564(b)(1) of the Act, 21 U.S.C. section 360bbb-3(b)(1), unless the authorization is terminated or revoked sooner.     Influenza A by PCR NEGATIVE NEGATIVE Final   Influenza B by PCR NEGATIVE NEGATIVE Final    Comment: (NOTE) The Xpert Xpress SARS-CoV-2/FLU/RSV assay is intended as an aid in  the  diagnosis of influenza from Nasopharyngeal swab specimens and  should not be used as a sole basis for treatment. Nasal washings and  aspirates are unacceptable for Xpert Xpress SARS-CoV-2/FLU/RSV  testing.  Fact Sheet for Patients: PinkCheek.be  Fact Sheet for Healthcare Providers: GravelBags.it  This test is not yet approved or cleared by the [place] FDA and  has been authorized for detection and/or diagnosis of SARS-CoV-2 by  FDA under an Emergency Use Authorization (EUA). This EUA will remain  in  effect (meaning this test can be used) for the duration of the  Covid-19 declaration under Section 564(b)(1) of the Act, 21  U.S.C. section 360bbb-3(b)(1), unless the authorization is  terminated or revoked. Performed at [facility], [place] [address] [place], [place] [address]          Radiology Studies: CT GUIDED NEEDLE PLACEMENT  Result Date: 03/06/2020 INDICATION: 69 year old female with anal cancer, non remaining pain, referred for pudendal nerve block EXAM: CT GUIDANCE NEEDLE PLACEMENT MEDICATIONS: None. ANESTHESIA/SEDATION: None FLUOROSCOPY TIME:  CT COMPLICATIONS: None PROCEDURE: Informed written consent was obtained from the patient after a thorough discussion of the procedural risks, benefits and alternatives. All questions were addressed. Maximal Sterile Barrier Technique was utilized including caps, mask, sterile gowns, sterile gloves, sterile drape, hand hygiene and skin antiseptic. A timeout was performed prior to the initiation of the procedure. Patient was position prone position on the CT gantry table. Scout CT was acquired for planning purposes. The patient was then prepped and draped in the usual sterile fashion. 1% lidocaine was used for local anesthesia. Using CT guidance, bilateral transgluteal approach was performed using 22 gauge 15 cm Chiba needles, targeting the pudendal canal within  the bilateral pelvis. Once we confirmed needle tip position with CT, small amount of 40% dilute contrast was injected to assure that the needle tip was not in a vascular space. We then injected a solution of 8 cc 5% bupivacaine and 1 mL (40 mg) Kenalog at both needle sites. Needles were removed and a final CT was performed. Patient tolerated the procedure well and remained hemodynamically stable throughout. No complications were encountered and no significant blood loss. IMPRESSION: Status post CT-guided needle placement at the bilateral pudendal canal for bilateral pudendal nerve block Signed, Dulcy Fanny. Dellia Nims, RPVI [facility] Radiology Electronically Signed   By: Corrie Mckusick D.O.   On: 03/06/2020 16:31   CT GUIDED NEEDLE PLACEMENT  Result Date: 03/06/2020 INDICATION: 70 year old female with anal cancer, non remaining pain, referred for pudendal nerve block EXAM: CT GUIDANCE NEEDLE PLACEMENT MEDICATIONS: None. ANESTHESIA/SEDATION: None FLUOROSCOPY TIME:  CT COMPLICATIONS: None PROCEDURE: Informed written consent was obtained from the patient after a thorough discussion of the procedural risks, benefits and alternatives. All questions were addressed. Maximal Sterile Barrier Technique was utilized including caps, mask, sterile gowns, sterile gloves, sterile drape, hand hygiene and skin antiseptic. A timeout was performed prior to the initiation of the procedure. Patient was position prone position on the CT gantry table. Scout CT was acquired for planning purposes. The patient was then prepped and draped in the usual sterile fashion. 1% lidocaine was used for local anesthesia. Using CT guidance, bilateral transgluteal approach was performed using 22 gauge 15 cm Chiba needles, targeting the pudendal canal within the bilateral pelvis. Once we confirmed needle tip position with CT, small amount of 40% dilute contrast was injected to assure that the needle tip  was not in a vascular space. We then injected a solution of 8 cc 5% bupivacaine and 1 mL (40 mg) Kenalog at both needle sites. Needles were removed and a final CT was performed. Patient tolerated the procedure well and remained hemodynamically stable throughout. No complications were encountered and no significant blood loss. IMPRESSION: Status post CT-guided needle placement at the bilateral pudendal canal for bilateral pudendal nerve block Signed, Dulcy Fanny. Dellia Nims, RPVI [facility] Radiology Electronically Signed   By: Corrie Mckusick D.O.   On:  03/06/2020 16:31        Scheduled Meds: . abacavir-dolutegravir-lamiVUDine  1 tablet Oral Daily  . DULoxetine  20 mg Oral Daily  . fentaNYL  1 patch Transdermal Q72H  . gabapentin  600 mg Oral BID  . lactose free nutrition  237 mL Oral TID WC  . polyethylene glycol  17 g Oral Daily  . senna-docusate  1 tablet Oral BID   Continuous Infusions:    LOS: 4 days    Time spent: 25 mins,More than 50% of that time was spent in counseling and/or coordination of care.      Shelly Coss, MD [facility] P10/20/2021, 8:06 AM

## 2020-03-07 NOTE — Evaluation (Addendum)
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  She was found to have necrotic anorectal mass  Clinical Impression  The patient motivated to mobilize. Required time and repositioning. Assisted to recliner, reports severe pain. Tolerated ~ 5 minutes. Then ambulated x 30 and returned to bed.   patient reports that she lives alone in apartment with bed/bath on second floor, this would be a hardship. Did mention short term rehab so encouraged her to talk to her daughter. Pt admitted with above diagnosis.  Pt currently with functional limitations due to the deficits listed below (see PT Problem List). Pt will benefit from skilled PT to increase their independence and safety with mobility to allow discharge to the venue listed below.        Follow Up Recommendations Home health PT;Supervision/Assistance - 24 hour;SNF    Equipment Recommendations  None recommended by PT    Recommendations for Other Services OT consult     Precautions / Restrictions Precautions Precaution Comments: oozes stool, very painful rectum      Mobility  Bed Mobility Overal bed mobility: Needs Assistance Bed Mobility: Rolling;Sidelying to Sit;Sit to Sidelying Rolling: Min guard Sidelying to sit: Min assist     Sit to sidelying: Min assist General bed mobility comments: extra time,    Transfers Overall transfer level: Needs assistance Equipment used: Rolling walker (2 wheeled) Transfers: Sit to/from [facility] Sit to Stand: Mod assist Stand pivot transfers: Mod assist       General transfer comment: first time standing required mod, then was min   .  Ambulation/Gait Ambulation/Gait assistance: Min Web designer (Feet): 30 Feet Assistive device: Rolling walker (2 wheeled) Gait Pattern/deviations: Step-to pattern Gait velocity: decr   General Gait Details: slow ad steady  Science writer    Modified Rankin (Stroke Patients Only)       Balance Overall balance assessment: Needs assistance Sitting-balance support: Bilateral upper extremity supported;Feet supported Sitting balance-Leahy Scale: Good     Standing balance support: During functional activity;No upper extremity supported Standing balance-Leahy Scale: Poor                               Pertinent Vitals/Pain Pain Assessment: 0-10 Pain Score: 10-Worst pain ever Pain Descriptors / Indicators: Jabbing;Moaning;Grimacing;Guarding Pain Intervention(s): Premedicated before session;Repositioned;Monitored during session;Limited activity within patient's tolerance    Home Living Family/patient expects to be discharged to:: Private residence   Available Help at Discharge: Family;Available PRN/intermittently Type of Home: Apartment Home Access: Stairs to enter   Entrance Stairs-Number of Steps: 4 Home Layout: Two level;Bed/bath upstairs Home Equipment: Walker - 2 wheels      Prior Function Level of Independence: Independent               Hand Dominance        Extremity/Trunk Assessment   Upper Extremity Assessment Upper Extremity Assessment: Overall WFL for tasks assessed    Lower Extremity Assessment Lower Extremity Assessment: Generalized weakness    Cervical / Trunk Assessment Cervical / Trunk Assessment: Normal  Communication   Communication: No difficulties  Cognition Arousal/Alertness: Awake/alert Behavior During Therapy:  WFL for tasks assessed/performed Overall Cognitive Status: Within Functional Limits for tasks assessed                                 General Comments:  appears very much in pain      General Comments      Exercises     Assessment/Plan    PT Assessment Patient needs continued PT services  PT Problem List Decreased activity tolerance;Decreased mobility;Pain;Decreased knowledge of use of DME       PT Treatment Interventions DME instruction;Gait training;Stair training;Functional mobility training;Therapeutic activities;Therapeutic exercise;Patient/family education    PT Goals (Current goals can be found in the Care Plan section)  Acute Rehab PT Goals Patient Stated Goal: to go home. PT Goal Formulation: With patient Time For Goal Achievement: 03/21/20 Potential to Achieve Goals: Fair    Frequency Min 3X/week   Barriers to discharge Decreased caregiver support;Inaccessible home environment      Co-evaluation               AM-PAC PT "6 Clicks" Mobility  Outcome Measure Help needed turning from your back to your side while in a flat bed without using bedrails?: A Lot Help needed moving from lying on your back to sitting on the side of a flat bed without using bedrails?: A Lot Help needed moving to and from a bed to a chair (including a wheelchair)?: A Lot Help needed standing up from a chair using your arms (e.g., wheelchair or bedside chair)?: A Lot Help needed to walk in hospital room?: A Lot Help needed climbing 3-5 steps with a railing? : Total 6 Click Score: 11    End of Session   Activity Tolerance: Patient limited by pain Patient left: in bed;with call bell/phone within reach;with bed alarm set Nurse Communication: Mobility status PT Visit Diagnosis: Difficulty in walking, not elsewhere classified (R26.2);Pain    Time: 9741-6384 PT Time Calculation (min) (ACUTE ONLY): 21 min   Charges:   PT Evaluation $PT Eval Low Complexity: [facility] Acute Rehabilitation Services Pager [phone] Office [phone]     Claretha Cooper 03/07/2020, 6:12 PM

## 2020-03-07 NOTE — Consult Note (Signed)
Palliative Care Inpatient Consultation  Vanessa Beard is a 69 yo woman with locally invasive anal cancer that I have been following for pain and symptom management in the cancer center since 01/27/20. She has a large visible anal mass that is ulcerated and friable. It causes her intense pain with bowel movements and she cannot sit for any length of time without pain. She reports any movement is painful. She underwent an interventional pudendal block procedure yesterday. She was in the procedure when I stopped by to see her yesterday, but I started her on a duragesic patch and q2 IV hydromorphone. I had previously had her on oxycodone and she failed methadone due to excessive drowsiness even on very low doses. Her pain is a combination of inflammatory bowel pain, neuropathic pain and mechanical pain. A variety of topical and local analgesics have been attempted without success- she cannot pass anything such as an enema or suppository past her anal mass and many topicals burn when applied.  She reports struggling with making decisions about her treatment options- she tells me she cannot live with this pain and has thought about dying if she has to continue like this. She is frustrated with lack of options and tells me just wishes it were possible to numb the area of her mass. In terms of pain management she is either sedated and comfortable or more awake and functional but in pain. Her appetite has gotten worse. She has lost weight. She looks much weaker than when I saw her a month ago.  She continues to have significant pain post pudental injection.    Recommendations:  1. Will increase her Duragesic to 37.24mcg. Start oral hydromorphone which she seems to tolerate better than other opioids. Maintain IV hydromorphone.  2.Will need to touch base with IR and clarify expectations and outcome anticipated following the procedure.   3. I reviewed her imaging and she has severely inflamed areas of her rectum and  proctitis. She also has very local superficial pain being generated at the anal verge. I think a trial of decadron-burst dose may be helpful. Will also help stimulate her appetite and improve her fatigue.  4. She has agreed to discuss Advance Directives on my next visit.  Vanessa Hacker, DO Palliative Medicine   Time: 50 min Greater than 50%  of this time was spent counseling and coordinating care related to the above assessment and plan.

## 2020-03-08 DIAGNOSIS — K6289 Other specified diseases of anus and rectum: Secondary | ICD-10-CM

## 2020-03-08 DIAGNOSIS — C210 Malignant neoplasm of anus, unspecified: Secondary | ICD-10-CM | POA: Diagnosis not present

## 2020-03-08 LAB — CBC WITH DIFFERENTIAL/PLATELET
Abs Immature Granulocytes: 0.03 10*3/uL (ref 0.00–0.07)
Basophils Absolute: 0 10*3/uL (ref 0.0–0.1)
Basophils Relative: 0 %
Eosinophils Absolute: 0 10*3/uL (ref 0.0–0.5)
Eosinophils Relative: 0 %
HCT: 29.3 % — ABNORMAL LOW (ref 36.0–46.0)
Hemoglobin: 9.5 g/dL — ABNORMAL LOW (ref 12.0–15.0)
Immature Granulocytes: 0 %
Lymphocytes Relative: 7 %
Lymphs Abs: 0.6 10*3/uL — ABNORMAL LOW (ref 0.7–4.0)
MCH: 31.5 pg (ref 26.0–34.0)
MCHC: 32.4 g/dL (ref 30.0–36.0)
MCV: 97 fL (ref 80.0–100.0)
Monocytes Absolute: 0.3 10*3/uL (ref 0.1–1.0)
Monocytes Relative: 4 %
Neutro Abs: 8.1 10*3/uL — ABNORMAL HIGH (ref 1.7–7.7)
Neutrophils Relative %: 89 %
Platelets: 199 10*3/uL (ref 150–400)
RBC: 3.02 MIL/uL — ABNORMAL LOW (ref 3.87–5.11)
RDW: 16.6 % — ABNORMAL HIGH (ref 11.5–15.5)
WBC: 9.1 10*3/uL (ref 4.0–10.5)
nRBC: 0 % (ref 0.0–0.2)

## 2020-03-08 LAB — SURGICAL PCR SCREEN
MRSA, PCR: NEGATIVE
Staphylococcus aureus: NEGATIVE

## 2020-03-08 MED ORDER — CHLORHEXIDINE GLUCONATE CLOTH 2 % EX PADS
6.0000 | MEDICATED_PAD | Freq: Once | CUTANEOUS | Status: AC
  Administered 2020-03-09: 6 via TOPICAL

## 2020-03-08 MED ORDER — CHLORHEXIDINE GLUCONATE CLOTH 2 % EX PADS
6.0000 | MEDICATED_PAD | Freq: Once | CUTANEOUS | Status: AC
  Administered 2020-03-08: 6 via TOPICAL

## 2020-03-08 MED ORDER — CEFAZOLIN SODIUM-DEXTROSE 2-4 GM/100ML-% IV SOLN
2.0000 g | INTRAVENOUS | Status: AC
  Administered 2020-03-09: 2 g via INTRAVENOUS

## 2020-03-08 MED ORDER — MUPIROCIN 2 % EX OINT
1.0000 "application " | TOPICAL_OINTMENT | Freq: Two times a day (BID) | CUTANEOUS | Status: DC
  Administered 2020-03-09 – 2020-03-11 (×5): 1 via NASAL
  Filled 2020-03-08: qty 22

## 2020-03-08 NOTE — Evaluation (Signed)
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  She was found to have necrotic anorectal mass   Clinical Impression   Vanessa Beard is a 69 year old woman with above medical history who is typically independent and lives alone. On evaluation she presents with pain, generalized weakness and decreased activity tolerance. Patient supervision for rolling in bed, min guard for mobility and ADLs. Patient has loose BMs and required clean up. Patient performed by rolling in bed and using spray bottle to assist with cleaning. Patient able to ambulate to bathroom without device to stand at sink to wash her hands but limited by episode of stomach/rectum spasm resulting in needing to sit on BSC and writhing in pain. Patient required another bout of cleaning BM which she performed with set up and min guard from therapist. Patient returned to bed with use of RW. Patient significantly limited by pain and reporting weakness and fatigue today resulting in minimal activity tolerance. Discussed POC with patient and she reports having intermittent assistance of family and friends and hopes to return home at discharge. However, patient very limited by pain and at risk for further debility if activity continues to be limited as well as not having 24/7 assistance at home and a multilevel home. Short term rehab is therapist's first recommendation.     Follow Up Recommendations  Home health OT;SNF    Equipment Recommendations  3 in 1 bedside commode    Recommendations for Other Services       Precautions / Restrictions Precautions Precautions: Fall Precaution Comments: oozes stool, very painful  rectum; with BM no wiping, spray and pat lightly      Mobility Bed Mobility Overal bed mobility: Needs Assistance Bed Mobility: Rolling;Sidelying to Sit;Sit to Sidelying Rolling: Supervision Sidelying to sit: Min guard     Sit to sidelying: Min guard General bed mobility comments: increased to perform due to pain. min guard for safety    Transfers Overall transfer level: Needs assistance Equipment used: Rolling walker (2 wheeled) Transfers: Sit to/from [facility] Sit to Stand: Min guard Stand pivot transfers: Min guard       General transfer comment: min guard for all ambulation to bathroom, standing at sink and return to bed. Slow gait due to pain.    Balance Overall balance assessment: No apparent balance deficits (not formally assessed)                                         ADL either performed or assessed with clinical judgement   ADL Overall ADL's : Needs assistance/impaired Eating/Feeding: Independent   Grooming: Wash/dry hands;Wash/dry face;Set up;Sitting;Standing;Min guard Grooming Details (indicate cue type and reason): Initially able to stand and wash hands at sink but episode of pain resulting in her needing to sit down. Washed hands seated on BSC in front of sink. Upper Body Bathing: Set up;Sitting   Lower Body Bathing: Set up;Sit to/from stand;Min guard   Upper Body Dressing : Set up;Sitting   Lower Body Dressing: Set up;Sit to/from stand;Min guard   Toilet Transfer: Min guard;Ambulation;BSC;RW   Toileting- Water quality scientist and Hygiene: Min guard;Sit  to/from stand Toileting - Water quality scientist Details (indicate cue type and reason): Performed pericare x 2 once in bed and then  on BSC. Needed set up and min guard for safety. Limited by pain.             Vision Baseline Vision/History: Wears glasses Wears Glasses: Reading only Vision Assessment?: No apparent visual deficits     Perception     Praxis       Pertinent Vitals/Pain Pain Assessment: 0-10 Pain Score: 8  Pain Location: Rectum; increased to 10/10 stomach referring to recturm with movement/ambulation Pain Descriptors / Indicators: Jabbing;Moaning;Grimacing;Guarding Pain Intervention(s): Limited activity within patient's tolerance;Patient requesting pain meds-RN notified;Repositioned     Hand Dominance Right   Extremity/Trunk Assessment Upper Extremity Assessment Upper Extremity Assessment: Overall WFL for tasks assessed   Lower Extremity Assessment Lower Extremity Assessment: Defer to PT evaluation   Cervical / Trunk Assessment Cervical / Trunk Assessment: Normal   Communication Communication Communication: No difficulties   Cognition Arousal/Alertness: Awake/alert Behavior During Therapy: WFL for tasks assessed/performed Overall Cognitive Status: Within Functional Limits for tasks assessed                                     General Comments       Exercises     Shoulder Instructions      Home Living Family/patient expects to be discharged to:: Private residence Living Arrangements: Alone Available Help at Discharge: Family;Available PRN/intermittently Type of Home: Apartment Home Access: Stairs to enter Entrance Stairs-Number of Steps: 4   Home Layout: Two level;Bed/bath upstairs Alternate Level Stairs-Number of Steps: flight Alternate Level Stairs-Rails: Left Bathroom Shower/Tub: Teacher, early years/pre: Standard Bathroom Accessibility: Yes   Home Equipment: Walker - 2 wheels;Shower seat          Prior Functioning/Environment Level of Independence: Independent                 OT Problem List: Decreased activity tolerance;Decreased knowledge of use of DME or AE;Pain;Decreased strength      OT Treatment/Interventions: Self-care/ADL training;Therapeutic exercise;DME and/or AE instruction;Therapeutic activities;Patient/family education    OT Goals(Current  goals can be found in the care plan section) Acute Rehab OT Goals Patient Stated Goal: to go home. OT Goal Formulation: With patient Time For Goal Achievement: 03/22/20 Potential to Achieve Goals: Good  OT Frequency: Min 2X/week   Barriers to D/C:            Co-evaluation              AM-PAC OT "6 Clicks" Daily Activity     Outcome Measure Help from another person eating meals?: None Help from another person taking care of personal grooming?: A Little Help from another person toileting, which includes using toliet, bedpan, or urinal?: A Little Help from another person bathing (including washing, rinsing, drying)?: A Little Help from another person to put on and taking off regular upper body clothing?: A Little Help from another person to put on and taking off regular lower body clothing?: A Little 6 Click Score: 19   End of Session Equipment Utilized During Treatment: Rolling walker Nurse Communication: Patient requests pain meds  Activity Tolerance: Patient limited by pain Patient left: in bed;with call bell/phone within reach  OT Visit Diagnosis: Muscle weakness (generalized) (M62.81);Pain Pain - part of body:  (rectum)  Time: 1326-1401 OT Time Calculation (min): 35 min Charges:  OT General Charges $OT Visit: 1 Visit OT Evaluation $OT Eval Moderate Complexity: 1 Mod OT Treatments $Self Care/Home Management : 8-22 mins  Purva Vessell, OTR/L [place] [phone] Pager: [phone]   Lenward Chancellor 03/08/2020, 2:18 PM

## 2020-03-08 NOTE — TOC Initial Note (Signed)
Transition of Care [facility]) - Initial/Assessment Note    Patient Details  Name: Vanessa Beard MRN: [id number] Date of Birth: [birth year]  Transition of [facility]) CM/SW Contact:    Mariana Wiederholt, Marjie Skiff, RN Phone Number: 03/08/2020, 1:40 PM  Clinical Narrative:                 Pt from home alone, and has support from her family. Spoke with pt at bedside for DC planning. She says she has been walking some in the hospital and has a RW at home. PT recommendations gone over with pt. She states that she has been less mobile due to pain.  Pt unsure if she wants to go to a SNF at this time but is willing to be faxed out to see what beds are available. FL2 faxed out and TOC will continue to follow with bed offers.    Expected Discharge Plan: Skilled Nursing Facility Barriers to Discharge: Continued Medical Work up   Patient Goals and CMS Choice Patient states their goals for this hospitalization and ongoing recovery are:: To have less pain      Expected Discharge Plan and Services Expected Discharge Plan: [place]   Discharge Planning Services: CM Consult   Living arrangements for the past 2 months: Apartment                   Prior Living Arrangements/Services Living arrangements for the past 2 months: Apartment Lives with:: Self Patient language and need for interpreter reviewed:: Yes        Need for Family Participation in Patient Care: Yes (Comment) Care giver support system in place?: Yes (comment)   Criminal Activity/Legal Involvement Pertinent to Current Situation/Hospitalization: No - Comment as needed  Activities of Daily Living Home Assistive Devices/Equipment: None ADL Screening (condition at time of admission) Patient's cognitive ability adequate to safely complete daily activities?: Yes Is the patient deaf or have difficulty hearing?: Yes Does the patient have difficulty seeing, even when wearing glasses/contacts?: Yes Does the patient have difficulty  concentrating, remembering, or making decisions?: Yes (just a little recently) Patient able to express need for assistance with ADLs?: Yes Does the patient have difficulty dressing or bathing?: Yes Independently performs ADLs?: Yes (appropriate for developmental age) Does the patient have difficulty walking or climbing stairs?: No Weakness of Legs: None Weakness of Arms/Hands: None  Permission Sought/Granted                  Emotional Assessment Appearance:: Appears stated age Attitude/Demeanor/Rapport: Engaged Affect (typically observed): Calm Orientation: : Oriented to Self, Oriented to Place, Oriented to  Time, Oriented to Situation      Admission diagnosis:  Anal cancer ([place]) [C21.0] Acute GI bleeding [K92.2] Patient Active Problem List   Diagnosis Date Noted  . Protein-calorie malnutrition, severe 03/05/2020  . Palliative care encounter 01/27/2020  . Goals of care, counseling/discussion 01/16/2020  . Hemorrhoid 12/16/2019  . Rectal pain 12/16/2019  . Anal cancer ([place]) 07/26/2019  . Rectal mass 07/14/2019  . Constipation 07/14/2019  . Anal fissure 07/14/2019  . Healthcare maintenance 06/30/2018  . Left knee pain 12/30/2017  . Left shoulder pain 12/30/2017  . Closed fracture of right tibial plateau 02/18/2017  . Benign hypertension 02/15/2017  . Screening examination for venereal disease 03/06/2015  . Encounter for long-term (current) use of medications 03/06/2015  . Elevated LFTs 12/11/2014  . Abnormal ultrasound 12/11/2014  . Abdominal pain, epigastric 12/11/2014  . ASCUS of cervix with negative high  risk HPV 10/31/2013  . Gall bladder disease 02/03/2013  . 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  . Compression fracture of L3 lumbar vertebra 07/01/2011  . Thyroid dysfunction 07/01/2011  . Anemia 07/01/2011  . Sore throat 04/02/2011  . Hyperparathyroidism 02/10/2011  . Osteopenia 02/03/2011  . PAP SMEAR, LGSIL, ABNORMAL 12/21/2009   . COUGH 07/18/2009  . Chronic hepatitis C virus infection ([place]) 12/19/2008  . LEUKORRHEA 04/07/2008  . FATIGUE 04/07/2008  . BACK PAIN, LUMBAR 11/10/2007  . ALLERGIC RHINITIS 07/09/2007  . PNEUMOCYSTIS PNEUMONIA 05/07/2007  . Essential hypertension 05/07/2007  . DENTAL CARIES 05/07/2007  . INSOMNIA 05/07/2007  . PNEUMONIA, HX OF 05/07/2007  . HERPES ZOSTER, HX OF 05/07/2007  . Human immunodeficiency virus (HIV) disease ([place]) 08/04/2006   PCP:  Seward Carol, MD Pharmacy:   [facility], [place] [address] Phone: [phone] Fax: [place], [place] [facility] [address] [place] [address] Phone: [phone] Fax: [phone]     Social Determinants of Health (SDOH) Interventions    Readmission Risk Interventions No flowsheet data found.

## 2020-03-08 NOTE — Progress Notes (Addendum)
HEMATOLOGY-ONCOLOGY PROGRESS NOTE  SUBJECTIVE: Nerve block completed by IR yesterday.  Resting quietly today.  Offers no complaints.  Palliative care following and assisting with pain management.  Oncology History Overview Note  Cancer Staging Anal cancer ([place]) Staging form: Anus, AJCC 8th Edition - Clinical: Stage IV (cT2, cN0, cM1) - Signed by Truitt Merle, MD on 08/12/2019    Anal cancer ([place])  07/19/2019 Procedure   Colonoscopy by Dr Havery Moros 07/19/19  IMPRESSION - Ulcer noted at the anal canal in posterior midline canal that extends into the distal rectum on digital rectal exam, nodular and somewhat hard to palpation. Endoscopic images show nodular tissue in the area, concerning for malignant ulcer. - The examined portion of the ileum was normal. - One 3 mm polyp at the hepatic flexure, removed with a cold snare. Resected and retrieved. - One 5 mm polyp in the transverse colon, removed with a cold snare. Resected and retrieved. - Diverticulosis in the ascending colon. - The examination was otherwise normal.   07/19/2019 Initial Biopsy   Diagnosis 07/19/19 1. Transverse Colon Polyp, hepatic flexure (2) - TUBULAR ADENOMA (1 OF 3 FRAGMENTS) - BENIGN COLONIC MUCOSA (2 OF 3 FRAGMENTS) - NO HIGH GRADE DYSPLASIA OR MALIGNANCY IDENTIFIED 2. Rectum, biopsy, distal rectal anal canal - SQUAMOUS CELL CARCINOMA - SEE COMMENT   07/26/2019 Initial Diagnosis   Anal cancer ([place])   07/26/2019 Cancer Staging   Staging form: Anus, AJCC 8th Edition - Clinical: Stage IV (cT2, cN1c, cM1) - Signed by Truitt Merle, MD on 08/12/2019   08/04/2019 PET scan   IMPRESSION: Hypermetabolic anal soft tissue mass, consistent with known primary anal carcinoma.   Sub-cm hypermetabolic lymph nodes in posterior perirectal space, right inguinal region, right iliac chain, and gastrohepatic ligament, suspicious for metastatic disease.   Small hypermetabolic left axillary lymph nodes. This would be unusual location for  metastatic anal carcinoma. Recommend clinical correlation for possibility the patient has had recent COVID vaccination in the left arm, which could explain this finding.   Multifocal airspace disease in both lower lungs with marked hypermetabolic activity. This favors infectious or inflammatory etiology, and is not typical for pulmonary metastases. Short-term follow-up by chest CT is recommended.   08/15/2019 - 10/05/2019 Radiation Therapy   Concurrent ChemoRT with Dr. Lisbeth Renshaw starting 08/15/19-10/05/19   08/15/2019 - 09/12/2019 Chemotherapy   Concurrent ChemoRT with Mitomycin and 5FU on week 1 and 5 starting 08/15/19 with week 5 dose on 09/12/19.    11/02/2019 Imaging   CT CAP w contrast  IMPRESSION: No acute findings in the chest, abdomen or pelvis.   Biapical and bibasilar scarring. Bronchiectasis and paraseptal emphysema in the lung bases.   Tortuous aorta.   Small umbilical hernia containing fat.   01/05/2020 Imaging   PET SCAN IMPRESSION: 1. Partial metabolic response. Persistent anal mass hypermetabolism, decreased. No residual hypermetabolic nodal metastases in the abdomen or pelvis. Left axillary hypermetabolic lymph nodes are decreased in size and metabolism. No new or progressive hypermetabolic metastatic disease. 2. Hypermetabolism associated with patchy consolidative airspace opacities in the mid to lower lungs bilaterally, decreased in extent and metabolism, favoring resolving inflammatory opacities. 3. Aortic Atherosclerosis (ICD10-I70.0) and Emphysema (ICD10-J43.9).   01/27/2020 - 02/17/2020 Chemotherapy   The patient had pembrolizumab (KEYTRUDA) 200 mg in sodium chloride 0.9 % 50 mL chemo infusion, 200 mg, Intravenous, Once, 2 of 6 cycles Administration: 200 mg (01/27/2020), 200 mg (02/17/2020)  for chemotherapy treatment.    03/14/2020 -  Chemotherapy   The patient had dexamethasone (  DECADRON) 4 MG tablet, 8 mg, Oral, Daily, 0 of 1 cycle, Start date: --, End date:  -- palonosetron (ALOXI) injection 0.25 mg, 0.25 mg, Intravenous,  Once, 0 of 4 cycles leucovorin 624 mg in dextrose 5 % 250 mL infusion, 400 mg/m2 = 624 mg, Intravenous,  Once, 0 of 4 cycles oxaliplatin (ELOXATIN) 110 mg in dextrose 5 % 500 mL chemo infusion, 70 mg/m2 = 110 mg (100 % of original dose 70 mg/m2), Intravenous,  Once, 0 of 4 cycles Dose modification: 70 mg/m2 (original dose 70 mg/m2, Cycle 1, Reason: Provider Judgment) fluorouracil (ADRUCIL) 3,100 mg in sodium chloride 0.9 % 88 mL chemo infusion, 2,000 mg/m2 = 3,100 mg (100 % of original dose 2,000 mg/m2), Intravenous, 1 Day/Dose, 0 of 4 cycles Dose modification: 2,000 mg/m2 (original dose 2,000 mg/m2, Cycle 1, Reason: Provider Judgment)  for chemotherapy treatment.       REVIEW OF SYSTEMS:   Constitutional: Denies fevers, chills  Eyes: Denies blurriness of vision Ears, nose, mouth, throat, and face: Denies mucositis or sore throat Respiratory: Denies cough, dyspnea or wheezes Cardiovascular: Denies palpitation, chest discomfort Gastrointestinal: Continues to have rectal pain Skin: Denies abnormal skin rashes Lymphatics: Denies new lymphadenopathy or easy bruising Neurological:Denies numbness, tingling or new weaknesses Behavioral/Psych: Mood is stable, no new changes  Extremities: No lower extremity edema All other systems were reviewed with the patient and are negative.  I have reviewed the past medical history, past surgical history, social history and family history with the patient and they are unchanged from previous note.   PHYSICAL EXAMINATION: ECOG PERFORMANCE STATUS: 3 - Symptomatic, >50% confined to bed  Vitals:   03/07/20 2012 03/08/20 0604  BP: 122/84 (!) 136/103  Pulse: 77 85  Resp: 17 15  Temp: 98.2 F (36.8 C) 98.4 F (36.9 C)  SpO2: 97% 97%   Filed Weights   03/03/20 1529  Weight: 57.4 kg    Intake/Output from previous day: 10/20 0701 - 10/21 0700 In: 240 [P.O.:240] Out: 200  [Urine:200]  GENERAL:alert, no distress and comfortable SKIN: skin color, texture, turgor are normal, no rashes or significant lesions EYES: normal, Conjunctiva are pink and non-injected, sclera clear OROPHARYNX:no exudate, no erythema and lips, buccal mucosa, and tongue normal  LUNGS: clear to auscultation and percussion with normal breathing effort HEART: regular rate & rhythm and no murmurs and no lower extremity edema ABDOMEN:abdomen soft, non-tender and normal bowel sounds RECTAL: Ulcerated mass at the anal verge Musculoskeletal:no cyanosis of digits and no clubbing  NEURO: alert & oriented x 3 with fluent speech, no focal motor/sensory deficits  LABORATORY DATA:  I have reviewed the data as listed CMP Latest Ref Rng & Units 03/05/2020 03/04/2020 03/03/2020  Glucose 70 - 99 mg/dL 87 89 123(H)  BUN 8 - 23 mg/dL 9 11 12   Creatinine 0.44 - 1.00 mg/dL 0.59 0.59 0.76  Sodium 135 - 145 mmol/L 132(L) 132(L) 131(L)  Potassium 3.5 - 5.1 mmol/L 3.9 4.0 4.2  Chloride 98 - 111 mmol/L 101 101 98  CO2 22 - 32 mmol/L 24 25 24   Calcium 8.9 - 10.3 mg/dL 9.4 9.4 10.3  Total Protein 6.5 - 8.1 g/dL - - 7.5  Total Bilirubin 0.3 - 1.2 mg/dL - - 0.7  Alkaline Phos 38 - 126 U/L - - 73  AST 15 - 41 U/L - - 23  ALT 0 - 44 U/L - - 12    Lab Results  Component Value Date   WBC 9.1 03/08/2020   HGB 9.5 (  L) 03/08/2020   HCT 29.3 (L) 03/08/2020   MCV 97.0 03/08/2020   PLT 199 03/08/2020   NEUTROABS 8.1 (H) 03/08/2020    CT GUIDED NEEDLE PLACEMENT  Result Date: 03/06/2020 INDICATION: 69 year old female with anal cancer, non remaining pain, referred for pudendal nerve block EXAM: CT GUIDANCE NEEDLE PLACEMENT MEDICATIONS: None. ANESTHESIA/SEDATION: None FLUOROSCOPY TIME:  CT COMPLICATIONS: None PROCEDURE: Informed written consent was obtained from the patient after a thorough discussion of the procedural risks, benefits and alternatives. All questions were addressed. Maximal Sterile Barrier Technique  was utilized including caps, mask, sterile gowns, sterile gloves, sterile drape, hand hygiene and skin antiseptic. A timeout was performed prior to the initiation of the procedure. Patient was position prone position on the CT gantry table. Scout CT was acquired for planning purposes. The patient was then prepped and draped in the usual sterile fashion. 1% lidocaine was used for local anesthesia. Using CT guidance, bilateral transgluteal approach was performed using 22 gauge 15 cm Chiba needles, targeting the pudendal canal within the bilateral pelvis. Once we confirmed needle tip position with CT, small amount of 40% dilute contrast was injected to assure that the needle tip was not in a vascular space. We then injected a solution of 8 cc 5% bupivacaine and 1 mL (40 mg) Kenalog at both needle sites. Needles were removed and a final CT was performed. Patient tolerated the procedure well and remained hemodynamically stable throughout. No complications were encountered and no significant blood loss. IMPRESSION: Status post CT-guided needle placement at the bilateral pudendal canal for bilateral pudendal nerve block Signed, Dulcy Fanny. Dellia Nims, RPVI V[facility] Inter[facility] Radiology Specialists [facility] Radiology Electronically Signed   By: Corrie Mckusick D.O.   On: 03/06/2020 16:31   CT GUIDED NEEDLE PLACEMENT  Result Date: 03/06/2020 INDICATION: 69 year old female with anal cancer, non remaining pain, referred for pudendal nerve block EXAM: CT GUIDANCE NEEDLE PLACEMENT MEDICATIONS: None. ANESTHESIA/SEDATION: None FLUOROSCOPY TIME:  CT COMPLICATIONS: None PROCEDURE: Informed written consent was obtained from the patient after a thorough discussion of the procedural risks, benefits and alternatives. All questions were addressed. Maximal Sterile Barrier Technique was utilized including caps, mask, sterile gowns, sterile gloves, sterile drape, hand hygiene and skin antiseptic. A timeout was performed prior to  the initiation of the procedure. Patient was position prone position on the CT gantry table. Scout CT was acquired for planning purposes. The patient was then prepped and draped in the usual sterile fashion. 1% lidocaine was used for local anesthesia. Using CT guidance, bilateral transgluteal approach was performed using 22 gauge 15 cm Chiba needles, targeting the pudendal canal within the bilateral pelvis. Once we confirmed needle tip position with CT, small amount of 40% dilute contrast was injected to assure that the needle tip was not in a vascular space. We then injected a solution of 8 cc 5% bupivacaine and 1 mL (40 mg) Kenalog at both needle sites. Needles were removed and a final CT was performed. Patient tolerated the procedure well and remained hemodynamically stable throughout. No complications were encountered and no significant blood loss. IMPRESSION: Status post CT-guided needle placement at the bilateral pudendal canal for bilateral pudendal nerve block Signed, Dulcy Fanny. Dellia Nims, RPVI Vascular and Interventional Radiology Specialists [facility] Radiology Electronically Signed   By: Corrie Mckusick D.O.   On: 03/06/2020 16:31   CT ABDOMEN PELVIS W CONTRAST  Result Date: 03/01/2020 CLINICAL DATA:  Colorectal cancer, new anal mass, difficulty voiding EXAM: CT ABDOMEN AND PELVIS WITH CONTRAST TECHNIQUE:  Multidetector CT imaging of the abdomen and pelvis was performed using the standard protocol following bolus administration of intravenous contrast. CONTRAST:  159mL OMNIPAQUE IOHEXOL 300 MG/ML  SOLN COMPARISON:  11/02/2019 FINDINGS: Lower chest: No acute abnormality. Fibrotic scarring of the included bilateral lung bases. Hepatobiliary: No focal liver abnormality is seen. Status post cholecystectomy. Postoperative biliary dilatation. Pancreas: Unremarkable. No pancreatic ductal dilatation or surrounding inflammatory changes. Spleen: Normal in size without significant abnormality. Adrenals/Urinary  Tract: Adrenal glands are unremarkable. Kidneys are normal, without renal calculi, solid lesion, or hydronephrosis. Bladder is unremarkable. Stomach/Bowel: Stomach is within normal limits. Appendix appears normal. There is an irregular, rim enhancing soft tissue mass about the anus, containing air and fluid, measuring overall approximately 5.4 x 2.4 cm (series 2, image 76). No significant change in perirectal and presacral soft tissue stranding (series 2, image 67). Vascular/Lymphatic: Scattered aortic atherosclerosis. No enlarged abdominal or pelvic lymph nodes. Reproductive: Small calcified uterine fibroids. Other: No abdominal wall hernia or abnormality. No abdominopelvic ascites. Musculoskeletal: No acute or significant osseous findings. Unchanged superior endplate deformities of T12 and L4. IMPRESSION: 1. There is an irregular, rim enhancing soft tissue mass about the anus, containing air and fluid, measuring overall approximately 5.4 x 2.4 cm. Findings most suggestive of necrotic anorectal mass. Correlate with physical exam findings. 2. Unchanged fat stranding of the perirectal and presacral soft tissues, in keeping with prior radiation therapy. 3. No evidence of lymphadenopathy or metastatic disease in the abdomen or pelvis. 4. Aortic Atherosclerosis (ICD10-I70.0). Electronically Signed   By: Eddie Candle M.D.   On: 03/01/2020 13:57    ASSESSMENT AND PLAN: 1.  Anal squamous cell carcinoma 2.  Anemia 3.  HIV 4.  History of hepatitis C 5.  Hypertension 6.  Hyperparathyroidism 7.  Osteoporosis  -Status post nerve block on 10/20.  Palliative care following and assisting with pain management.  Appreciate their assistance.   -The patient has been evaluated by surgery who does not recommend surgical intervention at this time.  Discussed chemotherapy with the patient and she is agreeable.  We will arrange for outpatient chemotherapy when she is discharged.  We will also have her seen by general surgery  as an outpatient if she wishes to pursue surgery at some point down the road. -The patient's hemoglobin is overall stable.  Monitor for now. -Continue ongoing management of her chronic medical conditions per hospitalist.   LOS: 5 days   Mikey Bussing, DNP, AGPCNP-BC, AOCNP 03/08/20  Addendum  I have seen the patient, examined her. I agree with the assessment and and plan and have edited the notes.   Ms Mell is doing slightly better overall, but she feels her pain is still not well controlled. She had nerve block yesterday. I reviewed our discussion in tumor board yesterday, and she agrees to try chemo, which we can start next week when her pain is better controlled. I will ask general surgery to placed put a [ort in tomorrow if possible. She is agreeable.   I will set up her f/u and chemo when she is ready to be discharged.   Truitt Merle  03/08/2020

## 2020-03-08 NOTE — Progress Notes (Signed)
Spoke with patient about plan for port-o-cath insertion to allow chemotherapy. We discussed the details of the surgery with goals of having an easily accessed subcutaneous port to allow central access. We discussed risks of bleeding, pneumothorax, infection, and discomfort. She showed good understanding and wanted to proceed.

## 2020-03-08 NOTE — Anesthesia Preprocedure Evaluation (Addendum)
Anesthesia Evaluation  Patient identified by MRN, date of birth, ID band Patient awake    Reviewed: Allergy & Precautions, NPO status , Patient's Chart, lab work & pertinent test results  Airway Mallampati: I  TM Distance: >3 FB Neck ROM: Full    Dental  (+) Edentulous Upper, Partial Lower,    Pulmonary neg pulmonary ROS,    Pulmonary exam normal breath sounds clear to auscultation       Cardiovascular hypertension, Pt. on medications Normal cardiovascular exam Rhythm:Regular Rate:Normal     Neuro/Psych negative neurological ROS  negative psych ROS   GI/Hepatic negative GI ROS, (+) Hepatitis -, C  Endo/Other  negative endocrine ROS  Renal/GU negative Renal ROS  negative genitourinary   Musculoskeletal negative musculoskeletal ROS (+)   Abdominal   Peds  Hematology  (+) Blood dyscrasia, anemia , HIV, H/H 9.5/29.3   Anesthesia Other Findings Anal squamous cell carcinoma   Methadone- started this hospitalization  Reproductive/Obstetrics negative OB ROS                          Anesthesia Physical Anesthesia Plan  ASA: III  Anesthesia Plan: General   Post-op Pain Management:    Induction: Intravenous  PONV Risk Score and Plan: 3 and Dexamethasone, Ondansetron and Treatment may vary due to age or medical condition  Airway Management Planned: LMA  Additional Equipment: None  Intra-op Plan:   Post-operative Plan: Extubation in OR  Informed Consent: I have reviewed the patients History and Physical, chart, labs and discussed the procedure including the risks, benefits and alternatives for the proposed anesthesia with the patient or authorized representative who has indicated his/her understanding and acceptance.   Patient has DNR.  Suspend DNR.   Dental advisory given  Plan Discussed with: CRNA  Anesthesia Plan Comments: (Discussed dNR with patient- wishes to suspend for 24h  perioperatively, wishes to have chest compressions in the event of life-threatening arrhythmia. )      Anesthesia Quick Evaluation

## 2020-03-08 NOTE — NC FL2 (Signed)
[place] LEVEL OF CARE SCREENING TOOL     IDENTIFICATION  Patient Name: Vanessa Beard Birthdate: 04-Nov-1950 Sex: female Admission Date (Current Location): 03/03/2020  [facility] [place] and [place] Number:  Herbalist and Address:  [facility],  [place] [address], [place]      Provider Number: [id number]  Attending Physician Name and Address:  Shelly Coss, MD  Relative Name and Phone Number:       Current Level of Care: Hospital Recommended Level of Care: [place] Prior Approval Number:    Date Approved/Denied:   PASRR Number: [id number]  Discharge Plan: SNF    Current Diagnoses: Patient Active Problem List   Diagnosis Date Noted  . Protein-calorie malnutrition, severe 03/05/2020  . Palliative care encounter 01/27/2020  . Goals of care, counseling/discussion 01/16/2020  . Hemorrhoid 12/16/2019  . Rectal pain 12/16/2019  . Anal cancer ([place]) 07/26/2019  . Rectal mass 07/14/2019  . Constipation 07/14/2019  . Anal fissure 07/14/2019  . Healthcare maintenance 06/30/2018  . Left knee pain 12/30/2017  . Left shoulder pain 12/30/2017  . Closed fracture of right tibial plateau 02/18/2017  . Benign hypertension 02/15/2017  . Screening examination for venereal disease 03/06/2015  . Encounter for long-term (current) use of medications 03/06/2015  . Elevated LFTs 12/11/2014  . Abnormal ultrasound 12/11/2014  . Abdominal pain, epigastric 12/11/2014  . ASCUS of cervix with negative high risk HPV 10/31/2013  . Gall bladder disease 02/03/2013  . 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  . Compression fracture of L3 lumbar vertebra 07/01/2011  . Thyroid dysfunction 07/01/2011  . Anemia 07/01/2011  . Sore throat 04/02/2011  . Hyperparathyroidism 02/10/2011  . Osteopenia 02/03/2011  . PAP SMEAR, LGSIL, ABNORMAL 12/21/2009  . COUGH 07/18/2009  . Chronic hepatitis C virus infection ([place])  12/19/2008  . LEUKORRHEA 04/07/2008  . FATIGUE 04/07/2008  . BACK PAIN, LUMBAR 11/10/2007  . ALLERGIC RHINITIS 07/09/2007  . PNEUMOCYSTIS PNEUMONIA 05/07/2007  . Essential hypertension 05/07/2007  . DENTAL CARIES 05/07/2007  . INSOMNIA 05/07/2007  . PNEUMONIA, HX OF 05/07/2007  . HERPES ZOSTER, HX OF 05/07/2007  . Human immunodeficiency virus (HIV) disease ([place]) 08/04/2006    Orientation RESPIRATION BLADDER Height & Weight     Self, Time, Situation, Place  Normal (Regular) Incontinent Weight: 57.4 kg Height:  5' (152.4 cm)  BEHAVIORAL SYMPTOMS/MOOD NEUROLOGICAL BOWEL NUTRITION STATUS      Incontinent Diet  AMBULATORY STATUS COMMUNICATION OF NEEDS Skin   Limited Assist Verbally Normal                       Personal Care Assistance Level of Assistance  Bathing, Dressing Bathing Assistance: Limited assistance   Dressing Assistance: Limited assistance     Functional Limitations Info             SPECIAL CARE FACTORS FREQUENCY  PT (By licensed PT), OT (By licensed OT)     PT Frequency: 5 x weekly OT Frequency: 5 x weekly            Contractures Contractures Info: Not present    Additional Factors Info  Code Status, Allergies Code Status Info: DNR Allergies Info: Dapsone, Retrovir, Sulfamethoxazole-trimethoprim           Current Medications (03/08/2020):  This is the current hospital active medication list Current Facility-Administered Medications  Medication Dose Route Frequency Provider Last Rate Last Admin  . abacavir-dolutegravir-lamiVUDine (TRIUMEQ) 600-50-300 MG per tablet 1 tablet  1 tablet Oral Daily Lenore Cordia, MD   1 tablet at 03/08/20 1024  . acetaminophen (TYLENOL) tablet 650 mg  650 mg Oral Q6H PRN Lenore Cordia, MD       Or  . acetaminophen (TYLENOL) suppository 650 mg  650 mg Rectal Q6H PRN Lenore Cordia, MD      . butamben-tetracaine-benzocaine (CETACAINE) spray 1 spray  1 spray Topical PRN Lane Hacker L, DO      .  dexamethasone (DECADRON) injection 8 mg  8 mg Intravenous Q24H Lane Hacker L, DO   8 mg at 03/07/20 1824  . DULoxetine (CYMBALTA) DR capsule 20 mg  20 mg Oral Daily Zada Finders R, MD   20 mg at 03/08/20 1024  . fentaNYL (DURAGESIC) 25 MCG/HR 1 patch  1 patch Transdermal Q72H Lane Hacker L, DO   1 patch at 03/06/20 1618  . gabapentin (NEURONTIN) capsule 600 mg  600 mg Oral BID Lane Hacker L, DO   600 mg at 03/08/20 1024  . HYDROmorphone (DILAUDID) injection 1 mg  1 mg Intravenous Q2H PRN Lane Hacker L, DO   1 mg at 03/08/20 1038  . influenza vaccine adjuvanted (FLUAD) injection 0.5 mL  0.5 mL Intramuscular Prior to discharge Shelly Coss, MD      . lactose free nutrition (BOOST PLUS) liquid 237 mL  237 mL Oral TID WC Adhikari, Amrit, MD   237 mL at 03/08/20 1025  . lip balm (CARMEX) ointment   Topical PRN Florencia Reasons, MD      . ondansetron [facility]) tablet 4 mg  4 mg Oral Q6H PRN Lenore Cordia, MD       Or  . ondansetron (ZOFRAN) injection 4 mg  4 mg Intravenous Q6H PRN Zada Finders R, MD      . polyethylene glycol (MIRALAX / GLYCOLAX) packet 17 g  17 g Oral Daily Shelly Coss, MD   17 g at 03/08/20 1027  . senna-docusate (Senokot-S) tablet 1 tablet  1 tablet Oral BID Shelly Coss, MD   1 tablet at 03/08/20 1024  . zolpidem (AMBIEN) tablet 5 mg  5 mg Oral QHS PRN Lenore Cordia, MD   5 mg at 03/08/20 0141     Discharge Medications: Please see discharge summary for a list of discharge medications.  Relevant Imaging Results:  Relevant Lab Results:   Additional Information ss# [id number]  large visible anal mass that is ulcerated and friable  Viki Carrera, Marjie Skiff, RN

## 2020-03-08 NOTE — Progress Notes (Signed)
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  She was found to have necrotic anorectal mass.  General surgery and oncology following.  After discussion, patient wanted to go for conservative management/wants to avoid surgery and wants to continue chemotherapy as an outpatient.  Palliative care consulted for pain management and IR did CT-guided nerve block.  Hospital course remarkable for significant pain requiring IV pain medications and prolonged hospital stay.  Assessment & Plan:   Principal Problem:   Anal cancer ([place]) Active Problems:   Human immunodeficiency virus (HIV) disease ([place])   Essential hypertension   Anemia   Protein-calorie malnutrition, severe  Anal squamous cell carcinoma with necrotic anorectal mass: She was following with oncology and was on Keytruda.  She presented with rectal pain, bleeding.  Found to have local progression of disease.  Continue pain management and supportive care.  General surgery were also following.  General surgery considered  fecal diversion with sigmoid loop colostomy for palliative approach but patient doesn't prefer surgery. Oncology is planning to continue chemotherapy as an outpatient. We also requested palliative care evaluation for pain management. IR did CT-guided nerve block on 10.19.21. Goal is  to control her pain better. Continue bowel regimen to avoid constipation. Still complains of severe pain today so discharge held.  Normocytic anemia: From chronic blood loss from anal cancer.  Currently hemoglobin stable.  HIV: CD4 of 76.  Not currently on PJP prophylaxis due to intolerance to Bactrim, dapsone.   Follows with ID.    Hypertension: Blood pressure soft.  Antihypertensives are on hold.  Monitor blood pressure  Hyponatremia: Improved with IV fluids.  Debility/deconditioning: PT recommended home health.  Nutrition Problem: Severe Malnutrition Etiology: chronic illness, cancer and cancer related treatments      DVT prophylaxis:SCD Code Status: DnR Family Communication: Discussed with daughter on phone on 03/05/2020 Status is: Inpatient  Remains inpatient appropriate because:Inpatient level of care appropriate due to severity of illness   Dispo: The patient is from: Home              Anticipated d/c is to: Home              Anticipated d/c date is: 1-2 says              Patient currently is not medically stable to d/c. Still complaining of significant pain requiring IV pain medications   Consultants: Oncology, surgery  Procedures: None  Antimicrobials:  Anti-infectives (From admission, onward)   Start     Dose/Rate Route Frequency Ordered Stop   03/03/20 2130  abacavir-dolutegravir-lamiVUDine (TRIUMEQ) [id number] MG per tablet 1 tablet        1 tablet Oral Daily 03/03/20 2000        Subjective:  Patient seen and examined at the bedside this morning.  Still complains of severe pain and on IV pain medications.  Does not feel ready to go home yet  Objective: Vitals:   03/07/20 0538 03/07/20 1516 03/07/20 2012 03/08/20 0604  BP: (!) 145/92 (!) 120/102 122/84 (!) 136/103  Pulse: 67 75 77 85  Resp: 17 18 17 15   Temp: (!) 97.5 F (36.4 C) 98.3 F (36.8 C) 98.2 F (36.8 C)  98.4 F (36.9 C)  TempSrc: Oral Oral Oral Oral  SpO2: 96% 98% 97% 97%  Weight:      Height:        Intake/Output Summary (Last 24 hours) at 03/08/2020 1249 Last data filed at 03/07/2020 1329 Gross per 24 hour  Intake 120 ml  Output --  Net 120 ml   Filed Weights   03/03/20 1529  Weight: 57.4 kg    Examination:  General exam: In mild to moderate distress due to pain,average  built HEENT:PERRL,Oral mucosa moist, Ear/Nose normal on gross exam Respiratory system: Bilateral equal air entry, normal vesicular breath sounds, no wheezes or crackles  Cardiovascular system: S1 & S2 heard, RRR. No JVD, murmurs, rubs, gallops or clicks. Gastrointestinal system: Abdomen is nondistended, soft and nontender. No organomegaly or masses felt. Normal bowel sounds heard. Central nervous system: Alert and oriented. No focal neurological deficits. Extremities: No edema, no clubbing ,no cyanosis Skin: No rashes, lesions or ulcers,no icterus ,no pallor    Data Reviewed: I have personally reviewed following labs and imaging studies  CBC: Recent Labs  Lab 03/03/20 1545 03/04/20 0430 03/05/20 0541 03/08/20 0520  WBC 7.8 6.2 6.7 9.1  NEUTROABS  --   --  5.3 8.1*  HGB 9.7* 8.3* 9.2* 9.5*  HCT 30.7* 25.7* 28.3* 29.3*  MCV 96.8 97.3 95.3 97.0  PLT 274 190 189 998   Basic Metabolic Panel: Recent Labs  Lab 03/03/20 1545 03/04/20 0430 03/05/20 0541  NA 131* 132* 132*  K 4.2 4.0 3.9  CL 98 101 101  CO2 24 25 24   GLUCOSE 123* 89 87  BUN 12 11 9   CREATININE 0.76 0.59 0.59  CALCIUM 10.3 9.4 9.4   GFR: Estimated Creatinine Clearance: 51.2 mL/min (by C-G formula based on SCr of 0.59 mg/dL). Liver Function Tests: Recent Labs  Lab 03/03/20 1545  AST 23  ALT 12  ALKPHOS 73  BILITOT 0.7  PROT 7.5  ALBUMIN 2.6*   No results for input(s): LIPASE, AMYLASE in the last 168 hours. No results for input(s): AMMONIA in the last 168 hours. Coagulation Profile: No results for input(s): INR, PROTIME in the last 168 hours. Cardiac Enzymes: No results for input(s): CKTOTAL, CKMB, CKMBINDEX, TROPONINI in the last 168 hours. BNP (last 3 results) No results for input(s): PROBNP in the last 8760 hours. HbA1C: No results for input(s): HGBA1C in the last 72 hours. CBG: No results for input(s): GLUCAP in the last 168 hours. Lipid Profile: No results for input(s): CHOL, HDL, LDLCALC,  TRIG, CHOLHDL, LDLDIRECT in the last 72 hours. Thyroid Function Tests: No results for input(s): TSH, T4TOTAL, FREET4, T3FREE, THYROIDAB in the last 72 hours. Anemia Panel: No results for input(s): VITAMINB12, FOLATE, FERRITIN, TIBC, IRON, RETICCTPCT in the last 72 hours. Sepsis Labs: Recent Labs  Lab 03/03/20 1628  LATICACIDVEN 1.8    Recent Results (from the past 240 hour(s))  Urine culture     Status: Abnormal   Collection Time: 03/01/20  2:13 PM   Specimen: Urine, Random  Result Value Ref Range Status   Specimen Description   Final    URINE, RANDOM Performed at [facility] [address], [place], [place] [address]    Special Requests   Final    NONE Performed at [facility], [place] [address], [place], [place] [address]    Culture (A)  Final    >=100,000 COLONIES/mL MULTIPLE SPECIES PRESENT, SUGGEST RECOLLECTION   Report Status 03/03/2020 FINAL  Final  Culture, blood (routine x 2)     Status: None (Preliminary result)   Collection Time: 03/03/20  4:29 PM   Specimen: BLOOD  Result Value Ref Range Status   Specimen Description   Final    BLOOD RIGHT ANTECUBITAL Performed at [facility] [address], [place], [place] [address]    Special Requests   Final    BOTTLES DRAWN AEROBIC AND ANAEROBIC Blood Culture adequate volume Performed at [facility] [address], [place], [place] [address]    Culture   Final    NO GROWTH 3 DAYS Performed at [facility], [place] [address] [place], [place] [address]    Report Status PENDING  Incomplete  Respiratory Panel by RT PCR (Flu A&B, Covid) - Nasopharyngeal Swab     Status: None   Collection Time: 03/03/20  6:09 PM   Specimen: Nasopharyngeal Swab  Result Value Ref Range Status   SARS Coronavirus 2 by RT PCR NEGATIVE NEGATIVE Final    Comment: (NOTE) SARS-CoV-2 target nucleic acids are NOT DETECTED.  The SARS-CoV-2 RNA is generally  detectable in upper respiratoy specimens during the acute phase of infection. The lowest concentration of SARS-CoV-2 viral copies this assay can detect is 131 copies/mL. A negative result does not preclude SARS-Cov-2 infection and should not be used as the sole basis for treatment or other patient management decisions. A negative result may occur with  improper specimen collection/handling, submission of specimen other than nasopharyngeal swab, presence of viral mutation(s) within the areas targeted by this assay, and inadequate number of viral copies (<131 copies/mL). A negative result must be combined with clinical observations, patient history, and epidemiological information. The expected result is Negative.  Fact Sheet for Patients:  PinkCheek.be  Fact Sheet for Healthcare Providers:  GravelBags.it  This test is no t yet approved or cleared by the [place] FDA and  has been authorized for detection and/or diagnosis of SARS-CoV-2 by FDA under an Emergency Use Authorization (EUA). This EUA will remain  in effect (meaning this test can be used) for the duration of the COVID-19 declaration under Section 564(b)(1) of the Act, 21 U.S.C. section 360bbb-3(b)(1), unless the authorization is terminated or revoked sooner.     Influenza A by PCR NEGATIVE NEGATIVE Final   Influenza B by PCR NEGATIVE NEGATIVE Final    Comment: (NOTE) The Xpert Xpress SARS-CoV-2/FLU/RSV assay is intended as an aid in  the diagnosis of influenza from Nasopharyngeal swab specimens and  should not be used as a sole basis for treatment. Nasal washings and  aspirates are unacceptable for Xpert Xpress SARS-CoV-2/FLU/RSV  testing.  Fact Sheet for Patients: PinkCheek.be  Fact Sheet for Healthcare Providers: GravelBags.it  This test is not yet approved or cleared by the [place] FDA and   has been authorized for detection and/or diagnosis of SARS-CoV-2 by  FDA under an Emergency Use Authorization (EUA). This EUA will remain  in effect (meaning this test can be used) for the duration of the  Covid-19 declaration under Section 564(b)(1) of the Act, 21  U.S.C. section 360bbb-3(b)(1), unless the authorization is  terminated or revoked. Performed at [facility], [place] [address], [place], [place] [address]          Radiology Studies: CT GUIDED NEEDLE PLACEMENT  Result Date: 03/06/2020 INDICATION: 69 year old female with anal cancer, non remaining pain, referred for pudendal nerve block EXAM: CT GUIDANCE NEEDLE PLACEMENT MEDICATIONS: None. ANESTHESIA/SEDATION: None FLUOROSCOPY TIME:  CT  COMPLICATIONS: None PROCEDURE: Informed written consent was obtained from the patient after a thorough discussion of the procedural risks, benefits and alternatives. All questions were addressed. Maximal Sterile Barrier Technique was utilized including caps, mask, sterile gowns, sterile gloves, sterile drape, hand hygiene and skin antiseptic. A timeout was performed prior to the initiation of the procedure. Patient was position prone position on the CT gantry table. Scout CT was acquired for planning purposes. The patient was then prepped and draped in the usual sterile fashion. 1% lidocaine was used for local anesthesia. Using CT guidance, bilateral transgluteal approach was performed using 22 gauge 15 cm Chiba needles, targeting the pudendal canal within the bilateral pelvis. Once we confirmed needle tip position with CT, small amount of 40% dilute contrast was injected to assure that the needle tip was not in a vascular space. We then injected a solution of 8 cc 5% bupivacaine and 1 mL (40 mg) Kenalog at both needle sites. Needles were removed and a final CT was performed. Patient tolerated the procedure well and remained hemodynamically stable throughout. No complications were  encountered and no significant blood loss. IMPRESSION: Status post CT-guided needle placement at the bilateral pudendal canal for bilateral pudendal nerve block Signed, Dulcy Fanny. Dellia Nims, RPVI [facility] Radiology Electronically Signed   By: Corrie Mckusick D.O.   On: 03/06/2020 16:31   CT GUIDED NEEDLE PLACEMENT  Result Date: 03/06/2020 INDICATION: 69 year old female with anal cancer, non remaining pain, referred for pudendal nerve block EXAM: CT GUIDANCE NEEDLE PLACEMENT MEDICATIONS: None. ANESTHESIA/SEDATION: None FLUOROSCOPY TIME:  CT COMPLICATIONS: None PROCEDURE: Informed written consent was obtained from the patient after a thorough discussion of the procedural risks, benefits and alternatives. All questions were addressed. Maximal Sterile Barrier Technique was utilized including caps, mask, sterile gowns, sterile gloves, sterile drape, hand hygiene and skin antiseptic. A timeout was performed prior to the initiation of the procedure. Patient was position prone position on the CT gantry table. Scout CT was acquired for planning purposes. The patient was then prepped and draped in the usual sterile fashion. 1% lidocaine was used for local anesthesia. Using CT guidance, bilateral transgluteal approach was performed using 22 gauge 15 cm Chiba needles, targeting the pudendal canal within the bilateral pelvis. Once we confirmed needle tip position with CT, small amount of 40% dilute contrast was injected to assure that the needle tip was not in a vascular space. We then injected a solution of 8 cc 5% bupivacaine and 1 mL (40 mg) Kenalog at both needle sites. Needles were removed and a final CT was performed. Patient tolerated the procedure well and remained hemodynamically stable throughout. No complications were encountered and no significant blood loss. IMPRESSION: Status post CT-guided needle placement at the bilateral pudendal canal for bilateral pudendal  nerve block Signed, Dulcy Fanny. Dellia Nims, RPVI [facility] Radiology Electronically Signed   By: Corrie Mckusick D.O.   On: 03/06/2020 16:31        Scheduled Meds: . abacavir-dolutegravir-lamiVUDine  1 tablet Oral Daily  . dexamethasone (DECADRON) injection  8 mg Intravenous Q24H  . DULoxetine  20 mg Oral Daily  . fentaNYL  1 patch Transdermal Q72H  . gabapentin  600 mg Oral BID  . lactose free nutrition  237 mL Oral TID WC  . polyethylene glycol  17 g Oral Daily  . senna-docusate  1 tablet Oral BID   Continuous Infusions:    LOS: 5 days  Time spent: 25 mins,More than 50% of that time was spent in counseling and/or coordination of care.      Shelly Coss, MD [facility] P10/21/2021, 12:49 PM

## 2020-03-09 ENCOUNTER — Inpatient Hospital Stay: Payer: Medicare HMO | Admitting: Hematology

## 2020-03-09 ENCOUNTER — Inpatient Hospital Stay: Payer: Medicare HMO

## 2020-03-09 ENCOUNTER — Inpatient Hospital Stay (HOSPITAL_COMMUNITY): Payer: Medicare HMO | Admitting: Anesthesiology

## 2020-03-09 ENCOUNTER — Inpatient Hospital Stay (HOSPITAL_COMMUNITY): Payer: Medicare HMO

## 2020-03-09 ENCOUNTER — Encounter (HOSPITAL_COMMUNITY): Payer: Self-pay | Admitting: Internal Medicine

## 2020-03-09 ENCOUNTER — Inpatient Hospital Stay: Payer: Medicare HMO | Admitting: Nutrition

## 2020-03-09 ENCOUNTER — Encounter (HOSPITAL_COMMUNITY)
Admission: EM | Disposition: A | Discharge status: Home / Self Care | Payer: Self-pay | Source: Home / Self Care | Attending: Internal Medicine

## 2020-03-09 DIAGNOSIS — C210 Malignant neoplasm of anus, unspecified: Secondary | ICD-10-CM | POA: Diagnosis not present

## 2020-03-09 HISTORY — PX: PORTACATH PLACEMENT: SHX2246

## 2020-03-09 LAB — CULTURE, BLOOD (ROUTINE X 2)
Culture: NO GROWTH
Special Requests: ADEQUATE

## 2020-03-09 SURGERY — INSERTION, TUNNELED CENTRAL VENOUS DEVICE, WITH PORT
Anesthesia: General | Site: Chest | Laterality: Right

## 2020-03-09 MED ORDER — HYDROMORPHONE HCL 2 MG PO TABS: 2.0000 mg | ORAL_TABLET | Freq: Four times a day (QID) | ORAL | 0 refills | Status: DC | PRN

## 2020-03-09 MED ORDER — PROPOFOL 10 MG/ML IV BOLUS
INTRAVENOUS | Status: DC | PRN
  Administered 2020-03-09: 100 mg via INTRAVENOUS

## 2020-03-09 MED ORDER — HEPARIN SOD (PORK) LOCK FLUSH 100 UNIT/ML IV SOLN
INTRAVENOUS | Status: AC
  Filled 2020-03-09: qty 5

## 2020-03-09 MED ORDER — DEXAMETHASONE SODIUM PHOSPHATE 10 MG/ML IJ SOLN
INTRAMUSCULAR | Status: AC
  Filled 2020-03-09: qty 3

## 2020-03-09 MED ORDER — PROPOFOL 10 MG/ML IV BOLUS
INTRAVENOUS | Status: AC
  Filled 2020-03-09: qty 60

## 2020-03-09 MED ORDER — LIDOCAINE HCL (CARDIAC) PF 100 MG/5ML IV SOSY
PREFILLED_SYRINGE | INTRAVENOUS | Status: DC | PRN
  Administered 2020-03-09: 60 mg via INTRAVENOUS

## 2020-03-09 MED ORDER — LIDOCAINE 2% (20 MG/ML) 5 ML SYRINGE
INTRAMUSCULAR | Status: AC
  Filled 2020-03-09: qty 15

## 2020-03-09 MED ORDER — HEPARIN SOD (PORK) LOCK FLUSH 100 UNIT/ML IV SOLN
INTRAVENOUS | Status: DC | PRN
  Administered 2020-03-09: 500 [IU] via INTRAVENOUS

## 2020-03-09 MED ORDER — HYDROMORPHONE HCL 4 MG PO TABS
4.0000 mg | ORAL_TABLET | ORAL | Status: DC | PRN
  Administered 2020-03-09 – 2020-03-11 (×7): 4 mg via ORAL
  Filled 2020-03-09 (×6): qty 1

## 2020-03-09 MED ORDER — ONDANSETRON HCL 4 MG/2ML IJ SOLN
INTRAMUSCULAR | Status: AC
  Filled 2020-03-09: qty 6

## 2020-03-09 MED ORDER — FENTANYL CITRATE (PF) 100 MCG/2ML IJ SOLN
INTRAMUSCULAR | Status: AC
  Filled 2020-03-09: qty 2

## 2020-03-09 MED ORDER — SODIUM CHLORIDE 0.9 % IV SOLN: Freq: Once | INTRAVENOUS | Status: DC

## 2020-03-09 MED ORDER — HYDROMORPHONE HCL 1 MG/ML IJ SOLN: 0.2500 mg | INTRAMUSCULAR | Status: DC | PRN

## 2020-03-09 MED ORDER — MIDAZOLAM HCL 2 MG/2ML IJ SOLN
INTRAMUSCULAR | Status: AC
  Filled 2020-03-09: qty 2

## 2020-03-09 MED ORDER — OXYCODONE HCL 5 MG PO TABS: 5.0000 mg | ORAL_TABLET | Freq: Once | ORAL | Status: DC | PRN

## 2020-03-09 MED ORDER — HYDROMORPHONE HCL 2 MG PO TABS
2.0000 mg | ORAL_TABLET | Freq: Four times a day (QID) | ORAL | Status: DC | PRN
  Administered 2020-03-09: 2 mg via ORAL
  Filled 2020-03-09: qty 1

## 2020-03-09 MED ORDER — ACETAMINOPHEN 500 MG PO TABS: 1000.0000 mg | ORAL_TABLET | Freq: Once | ORAL | Status: AC

## 2020-03-09 MED ORDER — BUPIVACAINE HCL 0.25 % IJ SOLN
INTRAMUSCULAR | Status: AC
  Filled 2020-03-09: qty 1

## 2020-03-09 MED ORDER — OXYCODONE HCL 5 MG/5ML PO SOLN: 5.0000 mg | Freq: Once | ORAL | Status: DC | PRN

## 2020-03-09 MED ORDER — FENTANYL CITRATE (PF) 100 MCG/2ML IJ SOLN
INTRAMUSCULAR | Status: DC | PRN
  Administered 2020-03-09: 25 ug via INTRAVENOUS
  Administered 2020-03-09: 50 ug via INTRAVENOUS

## 2020-03-09 MED ORDER — BELLADONNA ALKALOIDS-OPIUM 16.2-30 MG RE SUPP
1.0000 | Freq: Four times a day (QID) | RECTAL | Status: DC | PRN
  Filled 2020-03-09: qty 1

## 2020-03-09 MED ORDER — DEXAMETHASONE SODIUM PHOSPHATE 4 MG/ML IJ SOLN
INTRAMUSCULAR | Status: DC | PRN
  Administered 2020-03-09: 6 mg via INTRAVENOUS

## 2020-03-09 MED ORDER — BUTAMBEN-TETRACAINE-BENZOCAINE 2-2-14 % EX AERO: 1.0000 | INHALATION_SPRAY | CUTANEOUS | 0 refills | Status: AC | PRN

## 2020-03-09 MED ORDER — FENTANYL 50 MCG/HR TD PT72
1.0000 | MEDICATED_PATCH | TRANSDERMAL | Status: DC
  Administered 2020-03-09: 1 via TRANSDERMAL
  Filled 2020-03-09: qty 1

## 2020-03-09 MED ORDER — ONDANSETRON HCL 4 MG/2ML IJ SOLN
INTRAMUSCULAR | Status: DC | PRN
  Administered 2020-03-09: 4 mg via INTRAVENOUS

## 2020-03-09 MED ORDER — ACETAMINOPHEN 500 MG PO TABS
ORAL_TABLET | ORAL | Status: AC
  Administered 2020-03-09: 1000 mg via ORAL
  Filled 2020-03-09: qty 2

## 2020-03-09 MED ORDER — FENTANYL 50 MCG/HR TD PT72: 1.0000 | MEDICATED_PATCH | TRANSDERMAL | 0 refills | Status: DC

## 2020-03-09 MED ORDER — DEXAMETHASONE 4 MG PO TABS
6.0000 mg | ORAL_TABLET | Freq: Every day | ORAL | Status: DC
  Administered 2020-03-09 – 2020-03-11 (×3): 6 mg via ORAL
  Filled 2020-03-09 (×3): qty 2

## 2020-03-09 MED ORDER — GLYCOPYRROLATE 0.2 MG/ML IJ SOLN
INTRAMUSCULAR | Status: DC | PRN
  Administered 2020-03-09: .2 mg via INTRAVENOUS

## 2020-03-09 MED ORDER — BUPIVACAINE HCL (PF) 0.25 % IJ SOLN
INTRAMUSCULAR | Status: DC | PRN
  Administered 2020-03-09: 20 mL

## 2020-03-09 MED ORDER — HYDROMORPHONE HCL 1 MG/ML IJ SOLN: 0.5000 mg | INTRAMUSCULAR | Status: DC | PRN

## 2020-03-09 MED ORDER — GLYCOPYRROLATE PF 0.2 MG/ML IJ SOSY
PREFILLED_SYRINGE | INTRAMUSCULAR | Status: AC
  Filled 2020-03-09: qty 1

## 2020-03-09 MED ORDER — ONDANSETRON HCL 4 MG/2ML IJ SOLN: 4.0000 mg | Freq: Once | INTRAMUSCULAR | Status: DC | PRN

## 2020-03-09 MED ORDER — LACTATED RINGERS IV SOLN: INTRAVENOUS | Status: DC | PRN

## 2020-03-09 MED ORDER — SODIUM CHLORIDE 0.9 % IV SOLN
Freq: Once | INTRAVENOUS | Status: AC
  Filled 2020-03-09: qty 1.2

## 2020-03-09 MED ORDER — ROCURONIUM BROMIDE 10 MG/ML (PF) SYRINGE
PREFILLED_SYRINGE | INTRAVENOUS | Status: AC
  Filled 2020-03-09: qty 20

## 2020-03-09 MED ORDER — PHENYLEPHRINE HCL (PRESSORS) 10 MG/ML IV SOLN
INTRAVENOUS | Status: DC | PRN
  Administered 2020-03-09 (×2): 40 ug via INTRAVENOUS

## 2020-03-09 MED ORDER — MIDAZOLAM HCL 5 MG/5ML IJ SOLN
INTRAMUSCULAR | Status: DC | PRN
  Administered 2020-03-09 (×2): 1 mg via INTRAVENOUS

## 2020-03-09 SURGICAL SUPPLY — 41 items
ADH SKN CLS APL DERMABOND .7 (GAUZE/BANDAGES/DRESSINGS) ×1
APL PRP STRL LF DISP 70% ISPRP (MISCELLANEOUS) ×1
APL SKNCLS STERI-STRIP NONHPOA (GAUZE/BANDAGES/DRESSINGS) ×1
BAG DECANTER FOR FLEXI CONT (MISCELLANEOUS) ×2 IMPLANT
BENZOIN TINCTURE PRP APPL 2/3 (GAUZE/BANDAGES/DRESSINGS) ×2 IMPLANT
BLADE SURG 15 STRL LF DISP TIS (BLADE) ×1 IMPLANT
BLADE SURG 15 STRL SS (BLADE) ×2
BLADE SURG SZ11 CARB STEEL (BLADE) ×2 IMPLANT
CHLORAPREP W/TINT 26 (MISCELLANEOUS) ×2 IMPLANT
COVER SURGICAL LIGHT HANDLE (MISCELLANEOUS) ×2 IMPLANT
COVER WAND RF STERILE (DRAPES) IMPLANT
DECANTER SPIKE VIAL GLASS SM (MISCELLANEOUS) ×2 IMPLANT
DERMABOND ADVANCED (GAUZE/BANDAGES/DRESSINGS) ×1
DERMABOND ADVANCED .7 DNX12 (GAUZE/BANDAGES/DRESSINGS) ×1 IMPLANT
DRAPE C-ARM 42X120 X-RAY (DRAPES) ×2 IMPLANT
DRAPE LAPAROSCOPIC ABDOMINAL (DRAPES) ×2 IMPLANT
ELECT REM PT RETURN 15FT ADLT (MISCELLANEOUS) ×2 IMPLANT
GAUZE 4X4 16PLY RFD (DISPOSABLE) ×2 IMPLANT
GAUZE SPONGE 4X4 12PLY STRL (GAUZE/BANDAGES/DRESSINGS) ×2 IMPLANT
GLOVE BIOGEL PI IND STRL 7.0 (GLOVE) ×1 IMPLANT
GLOVE BIOGEL PI INDICATOR 7.0 (GLOVE) ×1
GLOVE SURG SS PI 7.0 STRL IVOR (GLOVE) ×2 IMPLANT
GOWN STRL REUS W/TWL LRG LVL3 (GOWN DISPOSABLE) ×2 IMPLANT
GOWN STRL REUS W/TWL XL LVL3 (GOWN DISPOSABLE) ×2 IMPLANT
KIT BASIN OR (CUSTOM PROCEDURE TRAY) ×2 IMPLANT
KIT PORT POWER 8FR ISP CVUE (Port) ×2 IMPLANT
KIT TURNOVER KIT A (KITS) IMPLANT
NEEDLE HYPO 22GX1.5 SAFETY (NEEDLE) ×2 IMPLANT
PACK BASIC VI WITH GOWN DISP (CUSTOM PROCEDURE TRAY) ×2 IMPLANT
PENCIL SMOKE EVACUATOR (MISCELLANEOUS) IMPLANT
SUT MNCRL AB 4-0 PS2 18 (SUTURE) ×2 IMPLANT
SUT PROLENE 2 0 SH DA (SUTURE) ×2 IMPLANT
SUT VIC AB 2-0 SH 18 (SUTURE) IMPLANT
SUT VIC AB 2-0 SH 27 (SUTURE)
SUT VIC AB 2-0 SH 27X BRD (SUTURE) IMPLANT
SUT VIC AB 3-0 SH 27 (SUTURE) ×2
SUT VIC AB 3-0 SH 27XBRD (SUTURE) ×1 IMPLANT
SYR 10ML LL (SYRINGE) ×2 IMPLANT
SYR 20ML LL LF (SYRINGE) ×2 IMPLANT
TOWEL OR 17X26 10 PK STRL BLUE (TOWEL DISPOSABLE) ×2 IMPLANT
TOWEL OR NON WOVEN STRL DISP B (DISPOSABLE) ×2 IMPLANT

## 2020-03-09 NOTE — Transfer of Care (Signed)
Immediate Anesthesia Transfer of Care Note  Patient: Vanessa Beard  Procedure(s) Performed: Procedure(s): INSERTION PORT-A-CATH (Right)  Patient Location: PACU  Anesthesia Type:General  Level of Consciousness:  sedated, patient cooperative and responds to stimulation  Airway & Oxygen Therapy:Patient Spontanous Breathing and Patient connected to face mask oxgen  Post-op Assessment:  Report given to PACU RN and Post -op Vital signs reviewed and stable  Post vital signs:  Reviewed and stable  Last Vitals:  Vitals:   03/09/20 0646 03/09/20 0852  BP: (!) 138/94 (!) (P) 170/112  Pulse: 79 (P) 80  Resp: 16 (P) 20  Temp: 36.8 C (!) (P) 36.4 C  SpO2: 100% (P) 701%    Complications: No apparent anesthesia complications

## 2020-03-09 NOTE — Anesthesia Procedure Notes (Signed)
Procedure Name: LMA Insertion Date/Time: 03/09/2020 8:00 AM Performed by: Lavina Hamman, CRNA Pre-anesthesia Checklist: Patient identified, Emergency Drugs available, Suction available and Patient being monitored Patient Re-evaluated:Patient Re-evaluated prior to induction Oxygen Delivery Method: Circle System Utilized Preoxygenation: Pre-oxygenation with 100% oxygen Induction Type: IV induction Ventilation: Mask ventilation without difficulty LMA: LMA with gastric port inserted LMA Size: 4.0 Number of attempts: 1 Airway Equipment and Method: Bite block Placement Confirmation: positive ETCO2 Tube secured with: Tape Dental Injury: Teeth and Oropharynx as per pre-operative assessment

## 2020-03-09 NOTE — Anesthesia Postprocedure Evaluation (Signed)
Anesthesia Post Note  Patient: Vanessa Beard  Procedure(s) Performed: INSERTION PORT-A-CATH (Right Chest)     Patient location during evaluation: PACU Anesthesia Type: General Level of consciousness: awake and alert, oriented and patient cooperative Pain management: pain level controlled Vital Signs Assessment: post-procedure vital signs reviewed and stable Respiratory status: spontaneous breathing, nonlabored ventilation and respiratory function stable Cardiovascular status: blood pressure returned to baseline and stable Postop Assessment: no apparent nausea or vomiting Anesthetic complications: no   No complications documented.  Last Vitals:  Vitals:   03/09/20 0945 03/09/20 1017  BP: (!) 159/102 (!) 128/95  Pulse: 67 69  Resp: 11 20  Temp:  (!) 36.4 C  SpO2: 94% 99%    Last Pain:  Vitals:   03/09/20 1017  TempSrc: Oral  PainSc:                  Pervis Hocking

## 2020-03-09 NOTE — TOC Transition Note (Signed)
Transition of Care [facility]) - CM/SW Discharge Note   Patient Details  Name: Vanessa Beard MRN: [id number] Date of Birth: Oct 19, 1950  [facility] of [facility] CM/SW Contact:  Zianna Dercole, Marjie Skiff, RN Phone Number: 03/09/2020, 3:15 PM   Clinical Narrative:    Pt was faxed out yesterday and has not received any bed offers. Spoke with pt at bedside and daughter Anabel Bene via phone. Both pt and daughter state that she would rather go home. Daughter has set up 24hr care with friends and family for at least the first few days after dc and she will have lots of check in help after that. Pt was set up with [facility] with HOP per PMT recommendation and was set up with [facility] for HHPT/OT/Aide.      Barriers to Discharge: No Barriers Identified   Patient Goals and CMS Choice Patient states their goals for this hospitalization and ongoing recovery are:: To have less pain    Discharge Plan and Services   Discharge Planning Services: CM Consult                      HH Arranged: PT, OT, Nurse's Aide [place] Agency: Hospice and [place], [place] ([place]) Date [place]: 03/09/20   Representative spoke with at [place]: [place] ([place]) Interventions     Readmission Risk Interventions Readmission Risk Prevention Plan 03/09/2020  Transportation Screening Complete  PCP or Specialist Appt within 3-5 Days Complete  HRI or [place] Complete  Social Work Consult for [place] Planning/Counseling Complete  Palliative Care Screening Complete  Medication Review [employer]) Complete  Some recent data might be hidden

## 2020-03-09 NOTE — Progress Notes (Signed)
PT Cancellation Note  Patient Details Name: Vanessa Beard MRN: [id number] DOB: [birth year]   Cancelled Treatment:    Reason Eval/Treat Not Completed: Attempted PT tx session-pt declined participation on today-"I just don't feel up to moving around right now."    Doreatha Massed, PT Acute Rehabilitation  Office: [phone] Pager: [phone]

## 2020-03-09 NOTE — Progress Notes (Signed)
Palliative Care Progress Note  Ms. Vanessa Beard is resting when I saw her-she did not appear to be in distress. She believes the topical Cetacaine is helping especially when she has bowel movements. Her appetite is poor, she hasn't been mobilizing much since being in the hospital. She doesn't think the "block" has helped as much as she hoped. She continues to describe pain that is coming from the mass in her anal area-she describes it as shooting through her body and constantly throbbing. Pain medications help but not unless she is almost sedated. She is questioning her QOL and if this will actually ever improve. She asks if chemotherapy will "shrink the mass" and thinks that removing the mass and having a colostomy would be better-we discussed surgery reservations about surgical intervention in her current state of health. We discussed treatment options currently on the table. We also discussed knowing her pain is still not optimally controlled and will continue to work at this.  Assessment:  Cancer Related Pain: Vanessa Beard has significant pain related to locally invasive anal cancer. We have tried various opioid combinations including methadone, oxycodone and adjuvants and none have ever fully controlled her pain or gotten her pain to a manageable level. She is using about 3mg  of additional hydromorphone IV in addition to a 52mcg fentanyl patch. Yesterday I initiated decadron hoping to reduce some of her inflammatory pain. Also started Cetacaine topical prn.   1. Increase Duragesic to 28mcg given usage of IV hydromorphone. 2. Start Hydromorphone PO for breakthrough-but leave IV as back up 3. Schedule Tylenol 4. Taper decadron switch to oral 5. Increased gabapentin  Will continue to work towards improving her pain-there are likely many layers of pain including existential pain.  Advance Care Planing: Will assist her with documentation and in defining her [place].  Disposition:  I am concerned about her ability  to care for herself at home where she lives alone and has steps to her bedroom and bathroom. Discussed with CMRN.  Lane Hacker, DO Palliative Medicine  Time: 35 min Greater than 50%  of this time was spent counseling and coordinating care related to the above assessment and plan.

## 2020-03-09 NOTE — Op Note (Signed)
Preoperative diagnosis: anal cancer  Postoperative diagnosis: same  Procedure: insertion of right IJ port-a-cath with ultrasound and fluoro guidance  Surgeon: Gurney Maxin, M.D.  Asst: none  Anesthesia: gen   Indications for procedure:69 yo female with advanced anal cancer that underwent Nigro protocol without resolution who continues to have pain and PET positive for multiple deep node areas. After options were discussed with medical oncology she is going to undergo palliative chemotherapy and would benefit from port placement.  Description of procedure: The patient was brought into the operative suite, anesthesia was administered with ETT, both arms were tucked with offloading foam over all pressure points. The patient was prepped and draped in the usual sterile fashion. Next WHO checklist was completed. The patient was put in trendelenberg, the [place] was used to assess anatomy and the RIJ was large and lay anterior the the common carotid artery A 18ga needle was used to gain access to the RIJ using ultrasound guidance. Nonpulsatile flow was seen and the J-wire was advanced without tension. XR was used to confirm placement within the venous system. No arrthymias were seen, the wire was clamped in place. The percutaneous access site was widened with a 11 blade. Local anesthesia was used to anesthetize the space inferior to the right clavicle. Next a 3cm incision was made 3cm inferior to the clavicle. Cautery was used to create a pocket along the fascia inferior to incision. The tunneling device was used to make a wide turn from the pocket up to the perc access site. The introducer and sheath were then thread over the J wire in seldinger technique and wire was removed. Next the catheter was thread from the incision to the access site and inserted into the sheath after the introducer was removed. Sheath was then pulled and removed. XR showed the tip of catheter to be at the atrial-caval junction. The  port was then attached to the catheter cutting the catheter at appropriate length. The port was then placed in pocket and sewed to the fascia in two places with a 2-0 prolene. The port was accessed with huber needel and flushed and drew easily and the port was flushed with concentrated heparin. The incision was closed with 3-0 vicryl followed with 4-0 monocryl in running subcu fashion. A single 4-0 was used to close the access site. Liqui-band was placed for dressing. Patient was extubated and brought to pacu in stable condition.  Findings: patent RIJ, catheter tip at the SVC atrial junction  Specimen: none  Blood loss: 10cc  Local anesthesia: 27cc 0.25% marcaine w epi  Complications: none  Gurney Maxin, M.D. General, Bariatric, & Minimally Invasive Surgery [facility]

## 2020-03-09 NOTE — Progress Notes (Signed)
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  She was found to have necrotic anorectal mass.  General surgery and oncology following.  After discussion, patient wanted to go for conservative management/wants to avoid surgery and wants to continue chemotherapy as an outpatient.  Palliative care consulted for pain management and IR did CT-guided nerve block.  Hospital course remarkable for significant pain requiring IV pain medications and prolonged hospital stay.  Assessment & Plan:   Principal Problem:   Anal cancer ([place]) Active Problems:   Human immunodeficiency virus (HIV) disease ([place])   Essential hypertension   Anemia   Protein-calorie malnutrition, severe  Anal squamous cell carcinoma with necrotic anorectal mass: She was following with oncology and was on Keytruda.  She presented with rectal pain, bleeding.  Found to have local progression of disease.  Continue pain management and supportive care.  General surgery were also following.  General surgery considered  fecal diversion with sigmoid loop colostomy for palliative approach but patient doesn't prefer surgery. Oncology is planning to continue chemotherapy as an outpatient. We also requested palliative care evaluation for pain management. IR did CT-guided nerve block on 10.19.21. Goal is  to control her pain better. Continue bowel regimen to avoid constipation. Her pain is better controlled today.  We have discontinued IV Dilaudid,started on oral dilaudid  and changed steroids to oral.  As per palliative care, fentanyl patch was increased to 50 mcg.  Normocytic anemia: From chronic blood loss from anal  cancer.  Currently hemoglobin stable.  HIV: CD4 of 76.  Not currently on PJP prophylaxis due to intolerance to Bactrim, dapsone.  Follows with ID.    Hypertension: Blood pressure soft.  Antihypertensives are on hold.  Monitor blood pressure  Hyponatremia: Improved with IV fluids.  Debility/deconditioning: PT recommended home health vs SNF  Nutrition Problem: Severe Malnutrition Etiology: chronic illness, cancer and cancer related treatments      DVT prophylaxis:SCD Code Status: DnR Family Communication: Discussed with daughter on phone on 03/05/2020 Status is: Inpatient  Remains inpatient appropriate because:Inpatient level of care appropriate due to severity of illness   Dispo: The patient is from: Home              Anticipated d/c is to: Home vs SNF              Anticipated d/c date is:1 days             Patient is medically stable for discharge but her daughter is arranging 24-hour home care before she can be discharged    Consultants: Oncology, surgery  Procedures: None  Antimicrobials:  Anti-infectives (From admission, onward)   Start     Dose/Rate Route Frequency Ordered Stop   03/09/20 0600  ceFAZolin (ANCEF) IVPB 2g/100 mL premix        2 g 200 mL/hr over 30 Minutes Intravenous On call to O.R. 03/08/20 1941 03/09/20 0741   03/03/20 2130  abacavir-dolutegravir-lamiVUDine (TRIUMEQ) 600-50-300 MG per tablet 1 tablet        1 tablet Oral Daily 03/03/20 2000        Subjective:  Patient seen and examined at the bedside this morning.  Hemodynamically stable.  She got  a Port-A-Cath placement today by general surgery for chemotherapy as an outpatient.  She was little concerned about bleeding from the Port-A-Cath site.  There was discussion about potential placement in a skilled nursing facility for discharge but family are leaning towards home home health and the daughter is asking for some time for arrangement of 24-hour care at home.  Objective: Vitals:   03/09/20  0915 03/09/20 0930 03/09/20 0945 03/09/20 1017  BP: (!) 153/99 (!) 160/99 (!) 159/102 (!) 128/95  Pulse: 76 71 67 69  Resp: 13 12 11 20   Temp:    (!) 97.5 F (36.4 C)  TempSrc:    Oral  SpO2: 100% 97% 94% 99%  Weight:      Height:        Intake/Output Summary (Last 24 hours) at 03/09/2020 1250 Last data filed at 03/09/2020 0916 Gross per 24 hour  Intake 500 ml  Output 205 ml  Net 295 ml   Filed Weights   03/03/20 1529 03/09/20 0640  Weight: 57.4 kg 57.4 kg    Examination:  General exam: Deconditioned, debilitated, chronically looking HEENT:PERRL,Oral mucosa moist, Ear/Nose normal on gross exam Respiratory system: Bilateral equal air entry, normal vesicular breath sounds, no wheezes or crackles  Cardiovascular system: S1 & S2 heard, RRR. No JVD, murmurs, rubs, gallops or clicks.  Port-A-Cath on the right chest Gastrointestinal system: Abdomen is nondistended, soft and nontender. No organomegaly or masses felt. Normal bowel sounds heard. Central nervous system: Alert and oriented. No focal neurological deficits. Extremities: No edema, no clubbing ,no cyanosis Skin: No rashes, lesions or ulcers,no icterus ,no pallor   Data Reviewed: I have personally reviewed following labs and imaging studies  CBC: Recent Labs  Lab 03/03/20 1545 03/04/20 0430 03/05/20 0541 03/08/20 0520  WBC 7.8 6.2 6.7 9.1  NEUTROABS  --   --  5.3 8.1*  HGB 9.7* 8.3* 9.2* 9.5*  HCT 30.7* 25.7* 28.3* 29.3*  MCV 96.8 97.3 95.3 97.0  PLT 274 190 189 951   Basic Metabolic Panel: Recent Labs  Lab 03/03/20 1545 03/04/20 0430 03/05/20 0541  NA 131* 132* 132*  K 4.2 4.0 3.9  CL 98 101 101  CO2 24 25 24   GLUCOSE 123* 89 87  BUN 12 11 9   CREATININE 0.76 0.59 0.59  CALCIUM 10.3 9.4 9.4   GFR: Estimated Creatinine Clearance: 51.2 mL/min (by C-G formula based on SCr of 0.59 mg/dL). Liver Function Tests: Recent Labs  Lab 03/03/20 1545  AST 23  ALT 12  ALKPHOS 73  BILITOT 0.7  PROT 7.5   ALBUMIN 2.6*   No results for input(s): LIPASE, AMYLASE in the last 168 hours. No results for input(s): AMMONIA in the last 168 hours. Coagulation Profile: No results for input(s): INR, PROTIME in the last 168 hours. Cardiac Enzymes: No results for input(s): CKTOTAL, CKMB, CKMBINDEX, TROPONINI in the last 168 hours. BNP (last 3 results) No results for input(s): PROBNP in the last 8760 hours. HbA1C: No results for input(s): HGBA1C in the last 72 hours. CBG: No results for input(s): GLUCAP in the last 168 hours. Lipid Profile: No results for input(s): CHOL, HDL, LDLCALC, TRIG, CHOLHDL, LDLDIRECT in the last 72 hours. Thyroid Function Tests: No results for input(s): TSH, T4TOTAL, FREET4, T3FREE, THYROIDAB in the last 72 hours. Anemia Panel: No results for input(s): VITAMINB12, FOLATE, FERRITIN, TIBC, IRON, RETICCTPCT in the last 72 hours. Sepsis Labs: Recent Labs  Lab 03/03/20 1628  LATICACIDVEN 1.8    Recent Results (from the past  240 hour(s))  Urine culture     Status: Abnormal   Collection Time: 03/01/20  2:13 PM   Specimen: Urine, Random  Result Value Ref Range Status   Specimen Description   Final    URINE, RANDOM Performed at [facility] [address] [place], [place] [address]    Special Requests   Final    NONE Performed at [facility], [place] [address] [place], [place] [address]    Culture (A)  Final    >=100,000 COLONIES/mL MULTIPLE SPECIES PRESENT, SUGGEST RECOLLECTION   Report Status 03/03/2020 FINAL  Final  Culture, blood (routine x 2)     Status: None   Collection Time: 03/03/20  4:29 PM   Specimen: BLOOD  Result Value Ref Range Status   Specimen Description   Final    BLOOD RIGHT ANTECUBITAL Performed at [facility] [address], [place], [place] [address]    Special Requests   Final    BOTTLES DRAWN AEROBIC AND ANAEROBIC Blood Culture adequate volume Performed at [facility] [address] [place], [place] [address]    Culture   Final    NO GROWTH 5 DAYS Performed at [facility], [place] [address], [place], [place] [address]    Report Status 03/09/2020 FINAL  Final  Respiratory Panel by RT PCR (Flu A&B, Covid) - Nasopharyngeal Swab     Status: None   Collection Time: 03/03/20  6:09 PM   Specimen: Nasopharyngeal Swab  Result Value Ref Range Status   SARS Coronavirus 2 by RT PCR NEGATIVE NEGATIVE Final    Comment: (NOTE) SARS-CoV-2 target nucleic acids are NOT DETECTED.  The SARS-CoV-2 RNA is generally detectable in upper respiratoy specimens during the acute phase of infection. The lowest concentration of SARS-CoV-2 viral copies this assay can detect is 131 copies/mL. A negative result does not preclude SARS-Cov-2 infection and should not be used as the sole basis for treatment or other patient management decisions. A negative result may occur with  improper specimen collection/handling, submission of specimen other than nasopharyngeal swab, presence of viral mutation(s) within the areas targeted by this assay, and inadequate number of viral copies (<131 copies/mL). A negative result must be combined with clinical observations, patient history, and epidemiological information. The expected result is Negative.  Fact Sheet for Patients:  PinkCheek.be  Fact Sheet for Healthcare Providers:  GravelBags.it  This test is no t yet approved or cleared by the [place] FDA and  has been authorized for detection and/or diagnosis of SARS-CoV-2 by FDA under an Emergency Use Authorization (EUA). This EUA will remain  in effect (meaning this test can be used) for the duration of the COVID-19 declaration under Section 564(b)(1) of the Act, 21 U.S.C. section 360bbb-3(b)(1), unless the authorization is terminated or revoked sooner.     Influenza A by PCR NEGATIVE NEGATIVE Final    Influenza B by PCR NEGATIVE NEGATIVE Final    Comment: (NOTE) The Xpert Xpress SARS-CoV-2/FLU/RSV assay is intended as an aid in  the diagnosis of influenza from Nasopharyngeal swab specimens and  should not be used as a sole basis for treatment. Nasal washings and  aspirates are unacceptable for Xpert Xpress SARS-CoV-2/FLU/RSV  testing.  Fact Sheet for Patients: PinkCheek.be  Fact Sheet for Healthcare Providers: GravelBags.it  This test is not yet approved or cleared by the [place] FDA and  has been authorized for detection and/or diagnosis of SARS-CoV-2 by  FDA  under an Emergency Use Authorization (EUA). This EUA will remain  in effect (meaning this test can be used) for the duration of the  Covid-19 declaration under Section 564(b)(1) of the Act, 21  U.S.C. section 360bbb-3(b)(1), unless the authorization is  terminated or revoked. Performed at [facility], [place] [address], [place], [place] [address]   Surgical PCR screen     Status: None   Collection Time: 03/08/20 10:14 PM   Specimen: Nasal Mucosa; Nasal Swab  Result Value Ref Range Status   MRSA, PCR NEGATIVE NEGATIVE Final   Staphylococcus aureus NEGATIVE NEGATIVE Final    Comment: (NOTE) The Xpert SA Assay (FDA approved for NASAL specimens in patients 33 years of age and older), is one component of a comprehensive surveillance program. It is not intended to diagnose infection nor to guide or monitor treatment. Performed at [facility], [place] [address], [place], [place] [address]          Radiology Studies: [facility] Chest Port 1 View  Result Date: 03/09/2020 CLINICAL DATA:  Status post Port-A-Cath placement. EXAM: PORTABLE CHEST 1 VIEW COMPARISON:  May 04, 2018. FINDINGS: Stable cardiomegaly. Interval placement of right internal jugular Port-A-Cath with distal tip in expected position of the right brachiocephalic  vein. No pneumothorax is noted. Mild bibasilar subsegmental atelectasis is noted. Bony thorax is unremarkable. IMPRESSION: Interval placement of right internal jugular Port-A-Cath with distal tip in expected position of the right brachiocephalic vein. Electronically Signed   By: Marijo Conception M.D.   On: 03/09/2020 09:25   DG C-Arm 1-60 Min-No Report  Result Date: 03/09/2020 Fluoroscopy was utilized by the requesting physician.  No radiographic interpretation.        Scheduled Meds: . abacavir-dolutegravir-lamiVUDine  1 tablet Oral Daily  . dexamethasone  6 mg Oral Daily  . DULoxetine  20 mg Oral Daily  . fentaNYL  1 patch Transdermal Q72H  . gabapentin  600 mg Oral BID  . lactose free nutrition  237 mL Oral TID WC  . mupirocin ointment  1 application Nasal BID  . polyethylene glycol  17 g Oral Daily  . senna-docusate  1 tablet Oral BID   Continuous Infusions:    LOS: 6 days    Time spent: 25 mins,More than 50% of that time was spent in counseling and/or coordination of care.      Shelly Coss, MD [facility] P10/22/2021, 12:50 PM

## 2020-03-09 NOTE — Progress Notes (Signed)
Pre Procedure note for inpatients:   Vanessa Beard has been scheduled for Procedure(s): INSERTION PORT-A-CATH (N/A) today. The various methods of treatment have been discussed with the patient. After consideration of the risks, benefits and treatment options the patient has consented to the planned procedure.   The patient has been seen and labs reviewed. There are no changes in the patient's condition to prevent proceeding with the planned procedure today.  Recent labs:  Lab Results  Component Value Date   WBC 9.1 03/08/2020   HGB 9.5 (L) 03/08/2020   HCT 29.3 (L) 03/08/2020   PLT 199 03/08/2020   GLUCOSE 87 03/05/2020   CHOL 154 06/30/2018   TRIG 95 06/30/2018   HDL 57 06/30/2018   LDLCALC 79 06/30/2018   ALT 12 03/03/2020   AST 23 03/03/2020   NA 132 (L) 03/05/2020   K 3.9 03/05/2020   CL 101 03/05/2020   CREATININE 0.59 03/05/2020   BUN 9 03/05/2020   CO2 24 03/05/2020   TSH 2.763 02/17/2020   INR 1.1 01/24/2020    Mickeal Skinner, MD 03/09/2020 7:17 AM

## 2020-03-09 NOTE — Progress Notes (Signed)
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  She was found to have necrotic anorectal mass.  General surgery and oncology following.  After discussion, patient wanted to go for conservative management/wants to avoid surgery and wants to continue chemotherapy as an outpatient.  Palliative care consulted for pain management and IR did CT-guided nerve block.  Hospital course remarkable for significant pain requiring IV pain medications and prolonged hospital stay. Plan is to discharge her to home health with better pain control. Assessment & Plan:   Principal Problem:   Anal cancer ([place]) Active Problems:   Human immunodeficiency virus (HIV) disease ([place])   Essential hypertension   Anemia   Protein-calorie malnutrition, severe  Anal squamous cell carcinoma with necrotic anorectal mass: She was following with oncology and was on Keytruda.  She presented with rectal pain, bleeding.  Found to have local progression of disease.  Continue pain management and supportive care.  General surgery were also following.  General surgery considered  fecal diversion with sigmoid loop colostomy for palliative approach but patient doesn't prefer surgery. Oncology is planning to continue chemotherapy as an outpatient. We also requested palliative care evaluation for pain management. IR did CT-guided nerve block on 10.19.21. Goal is  to control her pain better. Continue bowel regimen to avoid constipation. Continue current pain management regimen: She is on fentanyl patch 50 mcg, oral Dilaudid, gabapentin, Decadron  Normocytic anemia: From chronic blood loss from anal cancer.  Currently  hemoglobin stable.  HIV: CD4 of 76.  Not currently on PJP prophylaxis due to intolerance to Bactrim, dapsone.  Follows with ID.    Hypertension: Blood pressure soft.  Antihypertensives are on hold.  Monitor blood pressure  Hyponatremia: Improved with IV fluids.  Debility/deconditioning: PT recommended SnF.  Case manager following  Nutrition Problem: Severe Malnutrition Etiology: chronic illness, cancer and cancer related treatments      DVT prophylaxis:SCD Code Status: DnR Family Communication: Discussed with daughter on phone on 03/05/2020 Status is: Inpatient  Remains inpatient appropriate because:Inpatient level of care appropriate due to severity of illness   Dispo: The patient is from: Home              Anticipated d/c is WN:UUVO with hH              Anticipated d/c date is: tomorrow              Patient currently is not medically stable to d/c. Patient still complaining of significant pain that is currently 8/10.  She requested me to make her stay 1 more night  for better pain control.   Consultants: Oncology, surgery  Procedures: None  Antimicrobials:  Anti-infectives (From admission, onward)   Start     Dose/Rate Route Frequency Ordered Stop   03/09/20 0600  ceFAZolin (ANCEF) IVPB 2g/100 mL premix        2 g 200 mL/hr over 30 Minutes Intravenous On call to O.R. 03/08/20 1941 03/09/20 0741   03/03/20 2130  abacavir-dolutegravir-lamiVUDine (TRIUMEQ) 600-50-300 MG per tablet 1 tablet        1 tablet Oral Daily 03/03/20 2000        Subjective:  Seen and examined at bedside this  morning.  Hemodynamically stable, drowsy/sleepy.  Requested  Objective: Vitals:   03/08/20 2024 03/09/20 0511 03/09/20 0640 03/09/20 0646  BP: 109/85 118/90  (!) 138/94  Pulse: 74 77  79  Resp: 14 16  16   Temp: 98.4 F (36.9 C) 98.6 F (37 C)  98.3 F (36.8 C)  TempSrc: Oral Oral  Oral  SpO2: 93% 97%  100%  Weight:   57.4 kg   Height:        Intake/Output Summary (Last 24  hours) at 03/09/2020 0836 Last data filed at 03/09/2020 0820 Gross per 24 hour  Intake 620 ml  Output 5 ml  Net 615 ml   Filed Weights   03/03/20 1529 03/09/20 0640  Weight: 57.4 kg 57.4 kg    Examination:  General exam: Chronically ill looking, weak Respiratory system: Bilateral equal air entry, normal vesicular breath sounds, no wheezes or crackles  Cardiovascular system: S1 & S2 heard, RRR. No JVD, murmurs, rubs, gallops or clicks.  Chemo-Port on the right chest Gastrointestinal system: Abdomen is nondistended, soft and nontender. No organomegaly or masses felt. Normal bowel sounds heard. Central nervous system: Alert and oriented. No focal neurological deficits. Extremities: No edema, no clubbing ,no cyanosis Skin: No rashes, lesions or ulcers,no icterus ,no pallor    Data Reviewed: I have personally reviewed following labs and imaging studies  CBC: Recent Labs  Lab 03/03/20 1545 03/04/20 0430 03/05/20 0541 03/08/20 0520  WBC 7.8 6.2 6.7 9.1  NEUTROABS  --   --  5.3 8.1*  HGB 9.7* 8.3* 9.2* 9.5*  HCT 30.7* 25.7* 28.3* 29.3*  MCV 96.8 97.3 95.3 97.0  PLT 274 190 189 497   Basic Metabolic Panel: Recent Labs  Lab 03/03/20 1545 03/04/20 0430 03/05/20 0541  NA 131* 132* 132*  K 4.2 4.0 3.9  CL 98 101 101  CO2 24 25 24   GLUCOSE 123* 89 87  BUN 12 11 9   CREATININE 0.76 0.59 0.59  CALCIUM 10.3 9.4 9.4   GFR: Estimated Creatinine Clearance: 51.2 mL/min (by C-G formula based on SCr of 0.59 mg/dL). Liver Function Tests: Recent Labs  Lab 03/03/20 1545  AST 23  ALT 12  ALKPHOS 73  BILITOT 0.7  PROT 7.5  ALBUMIN 2.6*   No results for input(s): LIPASE, AMYLASE in the last 168 hours. No results for input(s): AMMONIA in the last 168 hours. Coagulation Profile: No results for input(s): INR, PROTIME in the last 168 hours. Cardiac Enzymes: No results for input(s): CKTOTAL, CKMB, CKMBINDEX, TROPONINI in the last 168 hours. BNP (last 3 results) No results for  input(s): PROBNP in the last 8760 hours. HbA1C: No results for input(s): HGBA1C in the last 72 hours. CBG: No results for input(s): GLUCAP in the last 168 hours. Lipid Profile: No results for input(s): CHOL, HDL, LDLCALC, TRIG, CHOLHDL, LDLDIRECT in the last 72 hours. Thyroid Function Tests: No results for input(s): TSH, T4TOTAL, FREET4, T3FREE, THYROIDAB in the last 72 hours. Anemia Panel: No results for input(s): VITAMINB12, FOLATE, FERRITIN, TIBC, IRON, RETICCTPCT in the last 72 hours. Sepsis Labs: Recent Labs  Lab 03/03/20 1628  LATICACIDVEN 1.8    Recent Results (from the past 240 hour(s))  Urine culture     Status: Abnormal   Collection Time: 03/01/20  2:13 PM   Specimen: Urine, Random  Result Value Ref Range Status   Specimen Description   Final    URINE, RANDOM Performed at [facility] [address] [place], [place] [address]  Special Requests   Final    NONE Performed at [facility], [place] [address], [place], [place] [address]    Culture (A)  Final    >=100,000 COLONIES/mL MULTIPLE SPECIES PRESENT, SUGGEST RECOLLECTION   Report Status 03/03/2020 FINAL  Final  Culture, blood (routine x 2)     Status: None   Collection Time: 03/03/20  4:29 PM   Specimen: BLOOD  Result Value Ref Range Status   Specimen Description   Final    BLOOD RIGHT ANTECUBITAL Performed at [facility] [address], [place], [place] [address]    Special Requests   Final    BOTTLES DRAWN AEROBIC AND ANAEROBIC Blood Culture adequate volume Performed at [facility] [address] [place], [place] [address]    Culture   Final    NO GROWTH 5 DAYS Performed at [facility], [place] [address], [place], [place] [address]    Report Status 03/09/2020 FINAL  Final  Respiratory Panel by RT PCR (Flu A&B, Covid) - Nasopharyngeal Swab     Status: None   Collection Time: 03/03/20  6:09 PM   Specimen:  Nasopharyngeal Swab  Result Value Ref Range Status   SARS Coronavirus 2 by RT PCR NEGATIVE NEGATIVE Final    Comment: (NOTE) SARS-CoV-2 target nucleic acids are NOT DETECTED.  The SARS-CoV-2 RNA is generally detectable in upper respiratoy specimens during the acute phase of infection. The lowest concentration of SARS-CoV-2 viral copies this assay can detect is 131 copies/mL. A negative result does not preclude SARS-Cov-2 infection and should not be used as the sole basis for treatment or other patient management decisions. A negative result may occur with  improper specimen collection/handling, submission of specimen other than nasopharyngeal swab, presence of viral mutation(s) within the areas targeted by this assay, and inadequate number of viral copies (<131 copies/mL). A negative result must be combined with clinical observations, patient history, and epidemiological information. The expected result is Negative.  Fact Sheet for Patients:  PinkCheek.be  Fact Sheet for Healthcare Providers:  GravelBags.it  This test is no t yet approved or cleared by the [place] FDA and  has been authorized for detection and/or diagnosis of SARS-CoV-2 by FDA under an Emergency Use Authorization (EUA). This EUA will remain  in effect (meaning this test can be used) for the duration of the COVID-19 declaration under Section 564(b)(1) of the Act, 21 U.S.C. section 360bbb-3(b)(1), unless the authorization is terminated or revoked sooner.     Influenza A by PCR NEGATIVE NEGATIVE Final   Influenza B by PCR NEGATIVE NEGATIVE Final    Comment: (NOTE) The Xpert Xpress SARS-CoV-2/FLU/RSV assay is intended as an aid in  the diagnosis of influenza from Nasopharyngeal swab specimens and  should not be used as a sole basis for treatment. Nasal washings and  aspirates are unacceptable for Xpert Xpress SARS-CoV-2/FLU/RSV  testing.  Fact Sheet  for Patients: PinkCheek.be  Fact Sheet for Healthcare Providers: GravelBags.it  This test is not yet approved or cleared by the [place] FDA and  has been authorized for detection and/or diagnosis of SARS-CoV-2 by  FDA under an Emergency Use Authorization (EUA). This EUA will remain  in effect (meaning this test can be used) for the duration of the  Covid-19 declaration under Section 564(b)(1) of the Act, 21  U.S.C. section 360bbb-3(b)(1), unless the authorization is  terminated or revoked. Performed at [facility], [place] [address] [place], [place]  [address]   Surgical PCR screen     Status: None   Collection Time: 03/08/20 10:14 PM   Specimen: Nasal Mucosa; Nasal Swab  Result Value Ref Range Status   MRSA, PCR NEGATIVE NEGATIVE Final   Staphylococcus aureus NEGATIVE NEGATIVE Final    Comment: (NOTE) The Xpert SA Assay (FDA approved for NASAL specimens in patients 65 years of age and older), is one component of a comprehensive surveillance program. It is not intended to diagnose infection nor to guide or monitor treatment. Performed at [facility], [place] [address], [place], [place] [address]          Radiology Studies: No results found.      Scheduled Meds: . abacavir-dolutegravir-lamiVUDine  1 tablet Oral Daily  . dexamethasone  6 mg Oral Daily  . DULoxetine  20 mg Oral Daily  . fentaNYL  1 patch Transdermal Q72H  . gabapentin  600 mg Oral BID  . heparin irrigation 6000 unit   Irrigation Once  . lactose free nutrition  237 mL Oral TID WC  . mupirocin ointment  1 application Nasal BID  . polyethylene glycol  17 g Oral Daily  . senna-docusate  1 tablet Oral BID   Continuous Infusions:    LOS: 6 days    Time spent: 15 mins,More than 50% of that time was spent in counseling and/or coordination of care.      Shelly Coss, MD [facility] P10/22/2021, 8:36 AM

## 2020-03-10 DIAGNOSIS — Z515 Encounter for palliative care: Secondary | ICD-10-CM | POA: Diagnosis not present

## 2020-03-10 DIAGNOSIS — G893 Neoplasm related pain (acute) (chronic): Secondary | ICD-10-CM

## 2020-03-10 DIAGNOSIS — C210 Malignant neoplasm of anus, unspecified: Secondary | ICD-10-CM | POA: Diagnosis not present

## 2020-03-10 NOTE — Progress Notes (Signed)
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  She was found to have necrotic anorectal mass.  General surgery and oncology following.  After discussion, patient wanted to go for conservative management/wants to avoid surgery and wants to continue chemotherapy as an outpatient.  Palliative care consulted for pain management and IR did CT-guided nerve block.  Hospital course remarkable for significant pain requiring IV pain medications and prolonged hospital stay. Plan is to discharge her to home health with better pain control. Assessment & Plan:   Principal Problem:   Anal cancer ([place]) Active Problems:   Human immunodeficiency virus (HIV) disease ([place])   Essential hypertension   Anemia   Protein-calorie malnutrition, severe  Anal squamous cell carcinoma with necrotic anorectal mass: She was following with oncology and was on Keytruda.  She presented with rectal pain, bleeding.  Found to have local progression of disease.  Continue pain management and supportive care.  General surgery were also following.  General surgery considered  fecal diversion with sigmoid loop colostomy for palliative approach but patient doesn't prefer surgery. Oncology is planning to continue chemotherapy as an outpatient. We also requested palliative care evaluation for pain management. IR did CT-guided nerve block on 10.19.21. Goal is  to control her pain better. Continue bowel regimen to avoid constipation. Continue current pain management regimen: She is on fentanyl patch 50 mcg, oral Dilaudid, gabapentin, Decadron  Normocytic anemia: From chronic blood loss from anal cancer.  Currently  hemoglobin stable.  HIV: CD4 of 76.  Not currently on PJP prophylaxis due to intolerance to Bactrim, dapsone.  Follows with ID.    Hypertension: Blood pressure soft.  Antihypertensives are on hold.  Monitor blood pressure  Hyponatremia: Improved with IV fluids.  Debility/deconditioning: PT recommended SnF.  Case manager following  Nutrition Problem: Severe Malnutrition Etiology: chronic illness, cancer and cancer related treatments      DVT prophylaxis:SCD Code Status: DnR Family Communication: Discussed with daughter on phone on 03/05/2020 Status is: Inpatient  Remains inpatient appropriate because:Inpatient level of care appropriate due to severity of illness   Dispo: The patient is from: Home              Anticipated d/c is RS:WNIO with hH              Anticipated d/c date is: tomorrow              Patient currently is not medically stable to d/c. Patient still complaining of significant pain that is currently 8/10.  She requested me to make her stay 1 more night  for better pain control.   Consultants: Oncology, surgery  Procedures: None  Antimicrobials:  Anti-infectives (From admission, onward)   Start     Dose/Rate Route Frequency Ordered Stop   03/09/20 0600  ceFAZolin (ANCEF) IVPB 2g/100 mL premix        2 g 200 mL/hr over 30 Minutes Intravenous On call to O.R. 03/08/20 1941 03/09/20 0741   03/03/20 2130  abacavir-dolutegravir-lamiVUDine (TRIUMEQ) 600-50-300 MG per tablet 1 tablet        1 tablet Oral Daily 03/03/20 2000        Subjective:  Seen and examined at bedside this  morning.  Hemodynamically stable, drowsy/sleepy.  Requested one more night stay for better pain control  Objective: Vitals:   03/09/20 1407 03/09/20 1804 03/09/20 2237 03/10/20 0559  BP: 111/79 103/87 99/78 115/71  Pulse: 67 66 75 71  Resp: 20 20 14 16   Temp: 97.7 F (36.5 C) 98.3 F (36.8 C) 99.4 F (37.4 C) 98.1 F (36.7 C)  TempSrc: Oral Oral Oral Oral  SpO2: 100% 99% 96%  98%  Weight:      Height:        Intake/Output Summary (Last 24 hours) at 03/10/2020 1155 Last data filed at 03/10/2020 0610 Gross per 24 hour  Intake --  Output 200 ml  Net -200 ml   Filed Weights   03/03/20 1529 03/09/20 0640  Weight: 57.4 kg 57.4 kg    Examination:  General exam: Chronically ill looking, weak Respiratory system: Bilateral equal air entry, normal vesicular breath sounds, no wheezes or crackles  Cardiovascular system: S1 & S2 heard, RRR. No JVD, murmurs, rubs, gallops or clicks.  Chemo-Port on the right chest Gastrointestinal system: Abdomen is nondistended, soft and nontender. No organomegaly or masses felt. Normal bowel sounds heard. Central nervous system: Alert and oriented. No focal neurological deficits. Extremities: No edema, no clubbing ,no cyanosis Skin: No rashes, lesions or ulcers,no icterus ,no pallor    Data Reviewed: I have personally reviewed following labs and imaging studies  CBC: Recent Labs  Lab 03/03/20 1545 03/04/20 0430 03/05/20 0541 03/08/20 0520  WBC 7.8 6.2 6.7 9.1  NEUTROABS  --   --  5.3 8.1*  HGB 9.7* 8.3* 9.2* 9.5*  HCT 30.7* 25.7* 28.3* 29.3*  MCV 96.8 97.3 95.3 97.0  PLT 274 190 189 626   Basic Metabolic Panel: Recent Labs  Lab 03/03/20 1545 03/04/20 0430 03/05/20 0541  NA 131* 132* 132*  K 4.2 4.0 3.9  CL 98 101 101  CO2 24 25 24   GLUCOSE 123* 89 87  BUN 12 11 9   CREATININE 0.76 0.59 0.59  CALCIUM 10.3 9.4 9.4   GFR: Estimated Creatinine Clearance: 51.2 mL/min (by C-G formula based on SCr of 0.59 mg/dL). Liver Function Tests: Recent Labs  Lab 03/03/20 1545  AST 23  ALT 12  ALKPHOS 73  BILITOT 0.7  PROT 7.5  ALBUMIN 2.6*   No results for input(s): LIPASE, AMYLASE in the last 168 hours. No results for input(s): AMMONIA in the last 168 hours. Coagulation Profile: No results for input(s): INR, PROTIME in the last 168 hours. Cardiac Enzymes: No results for input(s): CKTOTAL, CKMB, CKMBINDEX,  TROPONINI in the last 168 hours. BNP (last 3 results) No results for input(s): PROBNP in the last 8760 hours. HbA1C: No results for input(s): HGBA1C in the last 72 hours. CBG: No results for input(s): GLUCAP in the last 168 hours. Lipid Profile: No results for input(s): CHOL, HDL, LDLCALC, TRIG, CHOLHDL, LDLDIRECT in the last 72 hours. Thyroid Function Tests: No results for input(s): TSH, T4TOTAL, FREET4, T3FREE, THYROIDAB in the last 72 hours. Anemia Panel: No results for input(s): VITAMINB12, FOLATE, FERRITIN, TIBC, IRON, RETICCTPCT in the last 72 hours. Sepsis Labs: Recent Labs  Lab 03/03/20 1628  LATICACIDVEN 1.8    Recent Results (from the past 240 hour(s))  Urine culture     Status: Abnormal   Collection Time: 03/01/20  2:13 PM   Specimen: Urine, Random  Result Value Ref Range Status   Specimen Description   Final    URINE, RANDOM Performed at [facility], [address] [place], [place] [address]    Special Requests   Final    NONE Performed at [facility], [place] [address], [place], [place] [address]    Culture (A)  Final    >=100,000 COLONIES/mL MULTIPLE SPECIES PRESENT, SUGGEST RECOLLECTION   Report Status 03/03/2020 FINAL  Final  Culture, blood (routine x 2)     Status: None   Collection Time: 03/03/20  4:29 PM   Specimen: BLOOD  Result Value Ref Range Status   Specimen Description   Final    BLOOD RIGHT ANTECUBITAL Performed at [facility] [address], [place], [place] [address]    Special Requests   Final    BOTTLES DRAWN AEROBIC AND ANAEROBIC Blood Culture adequate volume Performed at [facility] [address], [place], [place] [address]    Culture   Final    NO GROWTH 5 DAYS Performed at [facility], [place] [address] [place], [place] [address]    Report Status 03/09/2020 FINAL  Final  Respiratory Panel by RT PCR (Flu A&B, Covid) - Nasopharyngeal Swab      Status: None   Collection Time: 03/03/20  6:09 PM   Specimen: Nasopharyngeal Swab  Result Value Ref Range Status   SARS Coronavirus 2 by RT PCR NEGATIVE NEGATIVE Final    Comment: (NOTE) SARS-CoV-2 target nucleic acids are NOT DETECTED.  The SARS-CoV-2 RNA is generally detectable in upper respiratoy specimens during the acute phase of infection. The lowest concentration of SARS-CoV-2 viral copies this assay can detect is 131 copies/mL. A negative result does not preclude SARS-Cov-2 infection and should not be used as the sole basis for treatment or other patient management decisions. A negative result may occur with  improper specimen collection/handling, submission of specimen other than nasopharyngeal swab, presence of viral mutation(s) within the areas targeted by this assay, and inadequate number of viral copies (<131 copies/mL). A negative result must be combined with clinical observations, patient history, and epidemiological information. The expected result is Negative.  Fact Sheet for Patients:  PinkCheek.be  Fact Sheet for Healthcare Providers:  GravelBags.it  This test is no t yet approved or cleared by the [place] FDA and  has been authorized for detection and/or diagnosis of SARS-CoV-2 by FDA under an Emergency Use Authorization (EUA). This EUA will remain  in effect (meaning this test can be used) for the duration of the COVID-19 declaration under Section 564(b)(1) of the Act, 21 U.S.C. section 360bbb-3(b)(1), unless the authorization is terminated or revoked sooner.     Influenza A by PCR NEGATIVE NEGATIVE Final   Influenza B by PCR NEGATIVE NEGATIVE Final    Comment: (NOTE) The Xpert Xpress SARS-CoV-2/FLU/RSV assay is intended as an aid in  the diagnosis of influenza from Nasopharyngeal swab specimens and  should not be used as a sole basis for treatment. Nasal washings and  aspirates are  unacceptable for Xpert Xpress SARS-CoV-2/FLU/RSV  testing.  Fact Sheet for Patients: PinkCheek.be  Fact Sheet for Healthcare Providers: GravelBags.it  This test is not yet approved or cleared by the [place] FDA and  has been authorized for detection and/or diagnosis of SARS-CoV-2 by  FDA under an Emergency Use Authorization (EUA). This EUA will remain  in effect (meaning this test can be used) for the duration of the  Covid-19 declaration under Section 564(b)(1) of the Act, 21  U.S.C. section 360bbb-3(b)(1), unless the authorization is  terminated or revoked. Performed at [facility]  [place] [address] [place], [place] [address]   Surgical PCR screen     Status: None   Collection Time: 03/08/20 10:14 PM   Specimen: Nasal Mucosa; Nasal Swab  Result Value Ref Range Status   MRSA, PCR NEGATIVE NEGATIVE Final   Staphylococcus aureus NEGATIVE NEGATIVE Final    Comment: (NOTE) The Xpert SA Assay (FDA approved for NASAL specimens in patients 14 years of age and older), is one component of a comprehensive surveillance program. It is not intended to diagnose infection nor to guide or monitor treatment. Performed at [facility], [place] [address] [place], [place] [address]          Radiology Studies: [facility] Chest Port 1 View  Result Date: 03/09/2020 CLINICAL DATA:  Status post Port-A-Cath placement. EXAM: PORTABLE CHEST 1 VIEW COMPARISON:  May 04, 2018. FINDINGS: Stable cardiomegaly. Interval placement of right internal jugular Port-A-Cath with distal tip in expected position of the right brachiocephalic vein. No pneumothorax is noted. Mild bibasilar subsegmental atelectasis is noted. Bony thorax is unremarkable. IMPRESSION: Interval placement of right internal jugular Port-A-Cath with distal tip in expected position of the right brachiocephalic vein. Electronically Signed   By: Marijo Conception M.D.   On: 03/09/2020 09:25   DG C-Arm 1-60 Min-No Report  Result Date: 03/09/2020 Fluoroscopy was utilized by the requesting physician.  No radiographic interpretation.        Scheduled Meds: . abacavir-dolutegravir-lamiVUDine  1 tablet Oral Daily  . dexamethasone  6 mg Oral Daily  . DULoxetine  20 mg Oral Daily  . fentaNYL  1 patch Transdermal Q72H  . gabapentin  600 mg Oral BID  . lactose free nutrition  237 mL Oral TID WC  . mupirocin ointment  1 application Nasal BID  . polyethylene glycol  17 g Oral Daily  . senna-docusate  1 tablet Oral BID   Continuous Infusions:    LOS: 7 days    Time spent: 15 mins,More than 50% of that time was spent in counseling and/or coordination of care.      Shelly Coss, MD [facility] P10/23/2021, 11:55 AM

## 2020-03-10 NOTE — Progress Notes (Signed)
1 Day Post-Op Port placement Subjective: No complaints  Objective: Vital signs in last 24 hours: Temp:  [97.5 F (36.4 C)-99.4 F (37.4 C)] 98.1 F (36.7 C) (10/23 0559) Pulse Rate:  [66-80] 71 (10/23 0559) Resp:  [11-20] 16 (10/23 0559) BP: (99-170)/(71-112) 115/71 (10/23 0559) SpO2:  [94 %-100 %] 98 % (10/23 0559)   Intake/Output from previous day: 10/22 0701 - 10/23 0700 In: 500 [I.V.:500] Out: 405 [Urine:400; Blood:5] Intake/Output this shift: No intake/output data recorded.   General appearance: alert and cooperative  Incision: no significant drainage  Lab Results:  Recent Labs    03/08/20 0520  WBC 9.1  HGB 9.5*  HCT 29.3*  PLT 199   BMET No results for input(s): NA, K, CL, CO2, GLUCOSE, BUN, CREATININE, CALCIUM in the last 72 hours. PT/INR No results for input(s): LABPROT, INR in the last 72 hours. ABG No results for input(s): PHART, HCO3 in the last 72 hours.  Invalid input(s): PCO2, PO2  MEDS, Scheduled . abacavir-dolutegravir-lamiVUDine  1 tablet Oral Daily  . dexamethasone  6 mg Oral Daily  . DULoxetine  20 mg Oral Daily  . fentaNYL  1 patch Transdermal Q72H  . gabapentin  600 mg Oral BID  . lactose free nutrition  237 mL Oral TID WC  . mupirocin ointment  1 application Nasal BID  . polyethylene glycol  17 g Oral Daily  . senna-docusate  1 tablet Oral BID    Studies/Results: DG Chest Port 1 View  Result Date: 03/09/2020 CLINICAL DATA:  Status post Port-A-Cath placement. EXAM: PORTABLE CHEST 1 VIEW COMPARISON:  May 04, 2018. FINDINGS: Stable cardiomegaly. Interval placement of right internal jugular Port-A-Cath with distal tip in expected position of the right brachiocephalic vein. No pneumothorax is noted. Mild bibasilar subsegmental atelectasis is noted. Bony thorax is unremarkable. IMPRESSION: Interval placement of right internal jugular Port-A-Cath with distal tip in expected position of the right brachiocephalic vein. Electronically  Signed   By: Marijo Conception M.D.   On: 03/09/2020 09:25   DG C-Arm 1-60 Min-No Report  Result Date: 03/09/2020 Fluoroscopy was utilized by the requesting physician.  No radiographic interpretation.    Assessment: s/p Procedure(s): INSERTION PORT-A-CATH Patient Active Problem List   Diagnosis Date Noted  . Protein-calorie malnutrition, severe 03/05/2020  . Palliative care encounter 01/27/2020  . Goals of care, counseling/discussion 01/16/2020  . Hemorrhoid 12/16/2019  . Rectal pain 12/16/2019  . Anal cancer ([place]) 07/26/2019  . Rectal mass 07/14/2019  . Constipation 07/14/2019  . Anal fissure 07/14/2019  . Healthcare maintenance 06/30/2018  . Left knee pain 12/30/2017  . Left shoulder pain 12/30/2017  . Closed fracture of right tibial plateau 02/18/2017  . Benign hypertension 02/15/2017  . Screening examination for venereal disease 03/06/2015  . Encounter for long-term (current) use of medications 03/06/2015  . Elevated LFTs 12/11/2014  . Abnormal ultrasound 12/11/2014  . Abdominal pain, epigastric 12/11/2014  . ASCUS of cervix with negative high risk HPV 10/31/2013  . Gall bladder disease 02/03/2013  . 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  . Compression fracture of L3 lumbar vertebra 07/01/2011  . Thyroid dysfunction 07/01/2011  . Anemia 07/01/2011  . Sore throat 04/02/2011  . Hyperparathyroidism 02/10/2011  . Osteopenia 02/03/2011  . PAP SMEAR, LGSIL, ABNORMAL 12/21/2009  . COUGH 07/18/2009  . Chronic hepatitis C virus infection ([place]) 12/19/2008  . LEUKORRHEA 04/07/2008  . FATIGUE 04/07/2008  . BACK PAIN, LUMBAR 11/10/2007  . ALLERGIC RHINITIS 07/09/2007  . PNEUMOCYSTIS PNEUMONIA 05/07/2007  .  Essential hypertension 05/07/2007  . DENTAL CARIES 05/07/2007  . INSOMNIA 05/07/2007  . PNEUMONIA, HX OF 05/07/2007  . HERPES ZOSTER, HX OF 05/07/2007  . Human immunodeficiency virus (HIV) disease ([place]) 08/04/2006    Expected post op  course  Plan: ok to use port   Will sign off.  Please call us with any concerns   LOS: 7 days     .Rosario Adie, MD [facility], [place]    03/10/2020 7:51 AM

## 2020-03-11 DIAGNOSIS — C210 Malignant neoplasm of anus, unspecified: Secondary | ICD-10-CM | POA: Diagnosis not present

## 2020-03-11 MED ORDER — DEXAMETHASONE 2 MG PO TABS
2.0000 mg | ORAL_TABLET | Freq: Every day | ORAL | 0 refills | Status: DC
Start: 2020-03-12 — End: 2020-03-27

## 2020-03-11 MED ORDER — BELLADONNA ALKALOIDS-OPIUM 16.2-30 MG RE SUPP: 1.0000 | Freq: Four times a day (QID) | RECTAL | 0 refills | Status: AC | PRN

## 2020-03-11 MED ORDER — ZOLPIDEM TARTRATE 10 MG PO TABS: 5.0000 mg | ORAL_TABLET | Freq: Every evening | ORAL | 1 refills | Status: DC | PRN

## 2020-03-11 MED ORDER — GABAPENTIN 300 MG PO CAPS
600.0000 mg | ORAL_CAPSULE | Freq: Two times a day (BID) | ORAL | 1 refills | Status: DC
Start: 2020-03-11 — End: 2020-03-27

## 2020-03-11 MED ORDER — SENNOSIDES-DOCUSATE SODIUM 8.6-50 MG PO TABS: 1.0000 | ORAL_TABLET | Freq: Two times a day (BID) | ORAL | 1 refills | Status: AC

## 2020-03-11 MED ORDER — POLYETHYLENE GLYCOL 3350 17 G PO PACK: 17.0000 g | PACK | Freq: Every day | ORAL | 0 refills | Status: AC

## 2020-03-11 NOTE — Discharge Summary (Addendum)
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  She was found to have necrotic anorectal mass.  General surgery and oncology following.  After discussion, patient wanted to go for conservative management/wants to avoid surgery and wants to continue chemotherapy as an outpatient.  Palliative care consulted for pain management and IR did CT-guided nerve block.  Hospital course remarkable for significant pain requiring IV pain medications and prolonged hospital stay. Plan is to discharge her to home with home health today.  Following problems were addressed during her hospitalization:  Anal squamous cell carcinoma with necrotic anorectal mass: She was following with oncology and was on Keytruda.  She presented with rectal pain, bleeding.  Found to have local progression of disease.  Continue pain management and supportive care.  General surgery were also following.  General surgery considered  fecal diversion with sigmoid loop colostomy for palliative approach but patient doesn't prefer surgery. Oncology is planning to continue chemotherapy as an outpatient. We also requested palliative care evaluation for pain management. IR did CT-guided nerve block on 10.19.21. Goal is  to control her pain better. Continue bowel regimen to avoid constipation. Continue current pain management regimen: She is on fentanyl patch 50 mcg, oral Dilaudid, gabapentin, Decadron and local anesthetics  Normocytic anemia: From chronic blood loss from anal  cancer.  Currently hemoglobin stable.  HIV: CD4 of 76.  Not currently on PJP prophylaxis due to intolerance to Bactrim, dapsone.  Follows with ID.    Hypertension: Blood pressure soft.  Antihypertensives stopped.  Monitor blood pressure  Hyponatremia: Improved with IV fluids.  Debility/deconditioning: PT recommended HH.  Case manager was following    Discharge Diagnoses:  Principal Problem:   Anal cancer ([place]) Active Problems:   Human immunodeficiency virus (HIV) disease ([place])   Essential hypertension   Anemia   Protein-calorie malnutrition, severe    Discharge Instructions  Discharge Instructions    Amb Referral to Palliative Care   Complete by: As directed    Diet general   Complete by: As directed    Discharge instructions   Complete by: As directed    1) Please take prescribed medications as instructed 2)Follow up with your oncologist as an outpatient. 3)Follow up with general surgery as an outpatient.  Name and number of the provider has been attached 4)Follow up with home health and palliative care as an outpatient ,follow-up with your primary care physician in a week   Increase activity slowly   Complete by: As directed    No wound care   Complete by: As directed      Allergies as of 03/11/2020      Reactions   Dapsone Nausea Only   stomach  burning   Retrovir [zidovudine] Other (See Comments)   Changed skin color   Sulfamethoxazole-trimethoprim Other (See Comments)   Flu like symptoms      Medication List    STOP taking these medications   AMBULATORY NON FORMULARY MEDICATION   amLODipine 10 MG tablet Commonly known as: NORVASC   Budesonide 2 MG/ACT Foam   diphenoxylate-atropine 2.5-0.025 MG tablet Commonly known as: LOMOTIL  hydrocortisone 25 MG suppository Commonly known as: ANUSOL-HC   lidocaine 2 % solution Commonly known as: XYLOCAINE   magnesium citrate Soln   methadone 5 MG tablet Commonly known as: DOLOPHINE    nitrofurantoin (macrocrystal-monohydrate) 100 MG capsule Commonly known as: MACROBID   Oxycodone HCl 10 MG Tabs   valACYclovir 1000 MG tablet Commonly known as: Valtrex     TAKE these medications   belladonna-opium 16.2-30 MG suppository Commonly known as: B&O SUPPRETTES Place 1 suppository rectally every 6 (six) hours as needed for pain.   butamben-tetracaine-benzocaine 06-20-12 % spray Commonly known as: CETACAINE Apply 1 spray topically as needed (for Bowel movement pain or perineal pain).   dexamethasone 2 MG tablet Commonly known as: DECADRON Take 1 tablet (2 mg total) by mouth daily. Take 3 pills daily  for 3 days then 2 pills daily for 3 days then 1 pill daily for 3 days then stop Start taking on: March 12, 2020   DULoxetine 20 MG capsule Commonly known as: CYMBALTA Take 1 capsule (20 mg total) by mouth daily.   fentaNYL 50 MCG/HR Commonly known as: [place] 1 patch onto the skin every 3 (three) days. Start taking on: March 12, 2020   gabapentin 300 MG capsule Commonly known as: NEURONTIN Take 2 capsules (600 mg total) by mouth 2 (two) times daily. What changed: how much to take   HYDROmorphone 2 MG tablet Commonly known as: DILAUDID Take 1 tablet (2 mg total) by mouth every 6 (six) hours as needed for severe pain.   polyethylene glycol 17 g packet Commonly known as: MIRALAX / GLYCOLAX Take 17 g by mouth daily. Start taking on: March 12, 2020   potassium chloride SA 20 MEQ tablet Commonly known as: KLOR-CON Take 1 tablet (20 mEq total) by mouth daily.   senna-docusate 8.6-50 MG tablet Commonly known as: Senokot-S Take 1 tablet by mouth 2 (two) times daily. What changed: how much to take   sucralfate 1 g tablet Commonly known as: CARAFATE TAKE 2 TABLETS AND MIX IN 4.5ML OF WATER AND PLACE IN ENEMA APPLICATOR USE TWICE DAILY What changed:   how much to take  how to take this  when to take this  additional instructions   Triumeq  600-50-300 MG tablet Generic drug: abacavir-dolutegravir-lamiVUDine TAKE 1 TABLET BY MOUTH DAILY   zolpidem 10 MG tablet Commonly known as: AMBIEN Take 0.5 tablets (5 mg total) by mouth at bedtime as needed. What changed: how much to take       Follow-up Information    Kinsinger, Arta Bruce, MD. Call.   Specialty: General Surgery Why: We are working on your appointment, call to confirm Please arrive 30 minutes prior to your appoinment to check in and fill out paperwork. Bring photo ID and insurance information. Contact information: [address] [place] [address] [phone]        [facility], [place] Follow up.   Why: agency will provide home health services Contact information: [address] [place] [address] [phone]              Allergies  Allergen Reactions  . Dapsone Nausea Only    stomach  burning  . Retrovir [Zidovudine] Other (See Comments)    Changed skin color  . Sulfamethoxazole-Trimethoprim Other (See Comments)    Flu like symptoms    Consultations: Cardiology, general surgery, oncology  Procedures/Studies: CT GUIDED NEEDLE PLACEMENT  Result Date: 03/06/2020 INDICATION: 69 year old female with anal  cancer, non remaining pain, referred for pudendal nerve block EXAM: CT GUIDANCE NEEDLE PLACEMENT MEDICATIONS: None. ANESTHESIA/SEDATION: None FLUOROSCOPY TIME:  CT COMPLICATIONS: None PROCEDURE: Informed written consent was obtained from the patient after a thorough discussion of the procedural risks, benefits and alternatives. All questions were addressed. Maximal Sterile Barrier Technique was utilized including caps, mask, sterile gowns, sterile gloves, sterile drape, hand hygiene and skin antiseptic. A timeout was performed prior to the initiation of the procedure. Patient was position prone position on the CT gantry table. Scout CT was acquired for planning purposes. The patient was then prepped and  draped in the usual sterile fashion. 1% lidocaine was used for local anesthesia. Using CT guidance, bilateral transgluteal approach was performed using 22 gauge 15 cm Chiba needles, targeting the pudendal canal within the bilateral pelvis. Once we confirmed needle tip position with CT, small amount of 40% dilute contrast was injected to assure that the needle tip was not in a vascular space. We then injected a solution of 8 cc 5% bupivacaine and 1 mL (40 mg) Kenalog at both needle sites. Needles were removed and a final CT was performed. Patient tolerated the procedure well and remained hemodynamically stable throughout. No complications were encountered and no significant blood loss. IMPRESSION: Status post CT-guided needle placement at the bilateral pudendal canal for bilateral pudendal nerve block Signed, Dulcy Fanny. Dellia Nims, RPVI [facility] Radiology Electronically Signed   By: Corrie Mckusick D.O.   On: 03/06/2020 16:31   CT GUIDED NEEDLE PLACEMENT  Result Date: 03/06/2020 INDICATION: 69 year old female with anal cancer, non remaining pain, referred for pudendal nerve block EXAM: CT GUIDANCE NEEDLE PLACEMENT MEDICATIONS: None. ANESTHESIA/SEDATION: None FLUOROSCOPY TIME:  CT COMPLICATIONS: None PROCEDURE: Informed written consent was obtained from the patient after a thorough discussion of the procedural risks, benefits and alternatives. All questions were addressed. Maximal Sterile Barrier Technique was utilized including caps, mask, sterile gowns, sterile gloves, sterile drape, hand hygiene and skin antiseptic. A timeout was performed prior to the initiation of the procedure. Patient was position prone position on the CT gantry table. Scout CT was acquired for planning purposes. The patient was then prepped and draped in the usual sterile fashion. 1% lidocaine was used for local anesthesia. Using CT guidance, bilateral transgluteal approach was performed  using 22 gauge 15 cm Chiba needles, targeting the pudendal canal within the bilateral pelvis. Once we confirmed needle tip position with CT, small amount of 40% dilute contrast was injected to assure that the needle tip was not in a vascular space. We then injected a solution of 8 cc 5% bupivacaine and 1 mL (40 mg) Kenalog at both needle sites. Needles were removed and a final CT was performed. Patient tolerated the procedure well and remained hemodynamically stable throughout. No complications were encountered and no significant blood loss. IMPRESSION: Status post CT-guided needle placement at the bilateral pudendal canal for bilateral pudendal nerve block Signed, Dulcy Fanny. Dellia Nims, RPVI [facility] Radiology Electronically Signed   By: Corrie Mckusick D.O.   On: 03/06/2020 16:31   CT ABDOMEN PELVIS W CONTRAST  Result Date: 03/01/2020 CLINICAL DATA:  Colorectal cancer, new anal mass, difficulty voiding EXAM: CT ABDOMEN AND PELVIS WITH CONTRAST TECHNIQUE: Multidetector CT imaging of the abdomen and pelvis was performed using the standard protocol following bolus administration of intravenous contrast. CONTRAST:  190mL OMNIPAQUE IOHEXOL 300 MG/ML  SOLN COMPARISON:  11/02/2019 FINDINGS: Lower chest: No acute abnormality. Fibrotic scarring  of the included bilateral lung bases. Hepatobiliary: No focal liver abnormality is seen. Status post cholecystectomy. Postoperative biliary dilatation. Pancreas: Unremarkable. No pancreatic ductal dilatation or surrounding inflammatory changes. Spleen: Normal in size without significant abnormality. Adrenals/Urinary Tract: Adrenal glands are unremarkable. Kidneys are normal, without renal calculi, solid lesion, or hydronephrosis. Bladder is unremarkable. Stomach/Bowel: Stomach is within normal limits. Appendix appears normal. There is an irregular, rim enhancing soft tissue mass about the anus, containing air and fluid, measuring  overall approximately 5.4 x 2.4 cm (series 2, image 76). No significant change in perirectal and presacral soft tissue stranding (series 2, image 67). Vascular/Lymphatic: Scattered aortic atherosclerosis. No enlarged abdominal or pelvic lymph nodes. Reproductive: Small calcified uterine fibroids. Other: No abdominal wall hernia or abnormality. No abdominopelvic ascites. Musculoskeletal: No acute or significant osseous findings. Unchanged superior endplate deformities of T12 and L4. IMPRESSION: 1. There is an irregular, rim enhancing soft tissue mass about the anus, containing air and fluid, measuring overall approximately 5.4 x 2.4 cm. Findings most suggestive of necrotic anorectal mass. Correlate with physical exam findings. 2. Unchanged fat stranding of the perirectal and presacral soft tissues, in keeping with prior radiation therapy. 3. No evidence of lymphadenopathy or metastatic disease in the abdomen or pelvis. 4. Aortic Atherosclerosis (ICD10-I70.0). Electronically Signed   By: Eddie Candle M.D.   On: 03/01/2020 13:57   DG Chest Port 1 View  Result Date: 03/09/2020 CLINICAL DATA:  Status post Port-A-Cath placement. EXAM: PORTABLE CHEST 1 VIEW COMPARISON:  May 04, 2018. FINDINGS: Stable cardiomegaly. Interval placement of right internal jugular Port-A-Cath with distal tip in expected position of the right brachiocephalic vein. No pneumothorax is noted. Mild bibasilar subsegmental atelectasis is noted. Bony thorax is unremarkable. IMPRESSION: Interval placement of right internal jugular Port-A-Cath with distal tip in expected position of the right brachiocephalic vein. Electronically Signed   By: Marijo Conception M.D.   On: 03/09/2020 09:25   DG C-Arm 1-60 Min-No Report  Result Date: 03/09/2020 Fluoroscopy was utilized by the requesting physician.  No radiographic interpretation.       Subjective: Patient seen and examined  at the bedside this morning.  Medically stable for  discharge.  Discharge Exam: Vitals:   03/10/20 2029 03/11/20 0547  BP: 102/74 117/75  Pulse: 76 67  Resp: 14 14  Temp: 98.2 F (36.8 C) 98.3 F (36.8 C)  SpO2: 91% 92%   Vitals:   03/10/20 0559 03/10/20 1530 03/10/20 2029 03/11/20 0547  BP: 115/71 100/72 102/74 117/75  Pulse: 71 67 76 67  Resp: 16 17 14 14   Temp: 98.1 F (36.7 C) 98.2 F (36.8 C) 98.2 F (36.8 C) 98.3 F (36.8 C)  TempSrc: Oral Oral Oral Oral  SpO2: 98% 90% 91% 92%  Weight:      Height:        General: Pt is alert, awake, not in acute distress Cardiovascular: RRR, S1/S2 +, no rubs, no gallops Respiratory: CTA bilaterally, no wheezing, no rhonchi Abdominal: Soft, NT, ND, bowel sounds + Extremities: no edema, no cyanosis    The results of significant diagnostics from this hospitalization (including imaging, microbiology, ancillary and laboratory) are listed below for reference.     Microbiology: Recent Results (from the past 240 hour(s))  Urine culture     Status: Abnormal   Collection Time: 03/01/20  2:13 PM   Specimen: Urine, Random  Result Value Ref Range Status   Specimen Description   Final    URINE, RANDOM Performed at [facility]  [facility], [place] [address] [place], [place] [address]    Special Requests   Final    NONE Performed at [facility], [place] [address], [place], [place] [address]    Culture (A)  Final    >=100,000 COLONIES/mL MULTIPLE SPECIES PRESENT, SUGGEST RECOLLECTION   Report Status 03/03/2020 FINAL  Final  Culture, blood (routine x 2)     Status: None   Collection Time: 03/03/20  4:29 PM   Specimen: BLOOD  Result Value Ref Range Status   Specimen Description   Final    BLOOD RIGHT ANTECUBITAL Performed at [place] [address] [place], [place] [address]    Special Requests   Final    BOTTLES DRAWN AEROBIC AND ANAEROBIC Blood Culture adequate volume Performed at [place] [address] [place], [place] [address]    Culture   Final    NO GROWTH 5 DAYS Performed at [facility], [place] [address], [place], [place] [address]    Report Status 03/09/2020 FINAL  Final  Respiratory Panel by RT PCR (Flu A&B, Covid) - Nasopharyngeal Swab     Status: None   Collection Time: 03/03/20  6:09 PM   Specimen: Nasopharyngeal Swab  Result Value Ref Range Status   SARS Coronavirus 2 by RT PCR NEGATIVE NEGATIVE Final    Comment: (NOTE) SARS-CoV-2 target nucleic acids are NOT DETECTED.  The SARS-CoV-2 RNA is generally detectable in upper respiratoy specimens during the acute phase of infection. The lowest concentration of SARS-CoV-2 viral copies this assay can detect is 131 copies/mL. A negative result does not preclude SARS-Cov-2 infection and should not be used as the sole basis for treatment or other patient management decisions. A negative result may occur with  improper specimen collection/handling, submission of specimen other than nasopharyngeal swab, presence of viral mutation(s) within the areas targeted by this assay, and inadequate number of viral copies (<131 copies/mL). A negative result must be combined with clinical observations, patient history, and epidemiological information. The expected result is Negative.  Fact Sheet for Patients:  PinkCheek.be  Fact Sheet for Healthcare Providers:  GravelBags.it  This test is no t yet approved or cleared by the [place] FDA and  has been authorized for detection and/or diagnosis of SARS-CoV-2 by FDA under an Emergency Use Authorization (EUA). This EUA will remain  in effect (meaning this test can be used) for the duration of the COVID-19 declaration under Section 564(b)(1) of the Act, 21 U.S.C. section 360bbb-3(b)(1), unless the authorization is terminated or revoked sooner.     Influenza A by PCR NEGATIVE NEGATIVE Final   Influenza B by PCR NEGATIVE  NEGATIVE Final    Comment: (NOTE) The Xpert Xpress SARS-CoV-2/FLU/RSV assay is intended as an aid in  the diagnosis of influenza from Nasopharyngeal swab specimens and  should not be used as a sole basis for treatment. Nasal washings and  aspirates are unacceptable for Xpert Xpress SARS-CoV-2/FLU/RSV  testing.  Fact Sheet for Patients: PinkCheek.be  Fact Sheet for Healthcare Providers: GravelBags.it  This test is not yet approved or cleared by the [place] FDA and  has been authorized for detection and/or diagnosis of SARS-CoV-2 by  FDA under an Emergency Use Authorization (EUA). This EUA will remain  in effect (meaning this test can be used) for the duration of the  Covid-19 declaration under Section 564(b)(1) of the Act, 21  U.S.C. section 360bbb-3(b)(1), unless the authorization is  terminated or revoked.  Performed at [facility], [place] [address] [place], [place] [address]   Surgical PCR screen     Status: None   Collection Time: 03/08/20 10:14 PM   Specimen: Nasal Mucosa; Nasal Swab  Result Value Ref Range Status   MRSA, PCR NEGATIVE NEGATIVE Final   Staphylococcus aureus NEGATIVE NEGATIVE Final    Comment: (NOTE) The Xpert SA Assay (FDA approved for NASAL specimens in patients 68 years of age and older), is one component of a comprehensive surveillance program. It is not intended to diagnose infection nor to guide or monitor treatment. Performed at [facility], [place] [address], [place], [place] [address]      Labs: BNP (last 3 results) No results for input(s): BNP in the last 8760 hours. Basic Metabolic Panel: Recent Labs  Lab 03/05/20 0541  NA 132*  K 3.9  CL 101  CO2 24  GLUCOSE 87  BUN 9  CREATININE 0.59  CALCIUM 9.4   Liver Function Tests: No results for input(s): AST, ALT, ALKPHOS, BILITOT, PROT, ALBUMIN in the last 168 hours. No results for  input(s): LIPASE, AMYLASE in the last 168 hours. No results for input(s): AMMONIA in the last 168 hours. CBC: Recent Labs  Lab 03/05/20 0541 03/08/20 0520  WBC 6.7 9.1  NEUTROABS 5.3 8.1*  HGB 9.2* 9.5*  HCT 28.3* 29.3*  MCV 95.3 97.0  PLT 189 199   Cardiac Enzymes: No results for input(s): CKTOTAL, CKMB, CKMBINDEX, TROPONINI in the last 168 hours. BNP: Invalid input(s): POCBNP CBG: No results for input(s): GLUCAP in the last 168 hours. D-Dimer No results for input(s): DDIMER in the last 72 hours. Hgb A1c No results for input(s): HGBA1C in the last 72 hours. Lipid Profile No results for input(s): CHOL, HDL, LDLCALC, TRIG, CHOLHDL, LDLDIRECT in the last 72 hours. Thyroid function studies No results for input(s): TSH, T4TOTAL, T3FREE, THYROIDAB in the last 72 hours.  Invalid input(s): FREET3 Anemia work up No results for input(s): VITAMINB12, FOLATE, FERRITIN, TIBC, IRON, RETICCTPCT in the last 72 hours. Urinalysis    Component Value Date/Time   COLORURINE AMBER (A) 03/03/2020 1628   APPEARANCEUR HAZY (A) 03/03/2020 1628   LABSPEC 1.018 03/03/2020 1628   PHURINE 6.0 03/03/2020 1628   GLUCOSEU NEGATIVE 03/03/2020 1628   HGBUR MODERATE (A) 03/03/2020 1628   BILIRUBINUR NEGATIVE 03/03/2020 [place] 03/03/2020 1628   PROTEINUR 30 (A) 03/03/2020 1628   UROBILINOGEN >8.0 (H) 11/03/2014 2000   NITRITE NEGATIVE 03/03/2020 1628   LEUKOCYTESUR MODERATE (A) 03/03/2020 1628   Sepsis Labs Invalid input(s): PROCALCITONIN,  WBC,  LACTICIDVEN Microbiology Recent Results (from the past 240 hour(s))  Urine culture     Status: Abnormal   Collection Time: 03/01/20  2:13 PM   Specimen: Urine, Random  Result Value Ref Range Status   Specimen Description   Final    URINE, RANDOM Performed at [facility], [place] [address] [place], [place] [address]    Special Requests   Final    NONE Performed at [facility], [place] [address], [place], [place] [address]    Culture (A)  Final    >=100,000 COLONIES/mL MULTIPLE SPECIES PRESENT, SUGGEST RECOLLECTION   Report Status 03/03/2020 FINAL  Final  Culture, blood (routine x 2)     Status: None   Collection Time: 03/03/20  4:29 PM   Specimen: BLOOD  Result Value Ref Range Status   Specimen Description   Final  BLOOD RIGHT ANTECUBITAL Performed at [facility] [address], [place], [place] [address]    Special Requests   Final    BOTTLES DRAWN AEROBIC AND ANAEROBIC Blood Culture adequate volume Performed at [facility] [address], [place], [place] [address]    Culture   Final    NO GROWTH 5 DAYS Performed at [facility], [place] [address], [place], [place] [address]    Report Status 03/09/2020 FINAL  Final  Respiratory Panel by RT PCR (Flu A&B, Covid) - Nasopharyngeal Swab     Status: None   Collection Time: 03/03/20  6:09 PM   Specimen: Nasopharyngeal Swab  Result Value Ref Range Status   SARS Coronavirus 2 by RT PCR NEGATIVE NEGATIVE Final    Comment: (NOTE) SARS-CoV-2 target nucleic acids are NOT DETECTED.  The SARS-CoV-2 RNA is generally detectable in upper respiratoy specimens during the acute phase of infection. The lowest concentration of SARS-CoV-2 viral copies this assay can detect is 131 copies/mL. A negative result does not preclude SARS-Cov-2 infection and should not be used as the sole basis for treatment or other patient management decisions. A negative result may occur with  improper specimen collection/handling, submission of specimen other than nasopharyngeal swab, presence of viral mutation(s) within the areas targeted by this assay, and inadequate number of viral copies (<131 copies/mL). A negative result must be combined with clinical observations, patient history, and epidemiological information. The expected result is Negative.  Fact Sheet for Patients:   PinkCheek.be  Fact Sheet for Healthcare Providers:  GravelBags.it  This test is no t yet approved or cleared by the [place] FDA and  has been authorized for detection and/or diagnosis of SARS-CoV-2 by FDA under an Emergency Use Authorization (EUA). This EUA will remain  in effect (meaning this test can be used) for the duration of the COVID-19 declaration under Section 564(b)(1) of the Act, 21 U.S.C. section 360bbb-3(b)(1), unless the authorization is terminated or revoked sooner.     Influenza A by PCR NEGATIVE NEGATIVE Final   Influenza B by PCR NEGATIVE NEGATIVE Final    Comment: (NOTE) The Xpert Xpress SARS-CoV-2/FLU/RSV assay is intended as an aid in  the diagnosis of influenza from Nasopharyngeal swab specimens and  should not be used as a sole basis for treatment. Nasal washings and  aspirates are unacceptable for Xpert Xpress SARS-CoV-2/FLU/RSV  testing.  Fact Sheet for Patients: PinkCheek.be  Fact Sheet for Healthcare Providers: GravelBags.it  This test is not yet approved or cleared by the [place] FDA and  has been authorized for detection and/or diagnosis of SARS-CoV-2 by  FDA under an Emergency Use Authorization (EUA). This EUA will remain  in effect (meaning this test can be used) for the duration of the  Covid-19 declaration under Section 564(b)(1) of the Act, 21  U.S.C. section 360bbb-3(b)(1), unless the authorization is  terminated or revoked. Performed at [facility], [place] [address], [place], [place] [address]   Surgical PCR screen     Status: None   Collection Time: 03/08/20 10:14 PM   Specimen: Nasal Mucosa; Nasal Swab  Result Value Ref Range Status   MRSA, PCR NEGATIVE NEGATIVE Final   Staphylococcus aureus NEGATIVE NEGATIVE Final    Comment: (NOTE) The Xpert SA Assay (FDA approved for NASAL specimens in  patients 85 years of age and older), is one component of a comprehensive surveillance program. It is not intended to diagnose infection nor to guide or  monitor treatment. Performed at [facility], [place] [address], [place], [place] [address]     Please note: You were cared for by a hospitalist during your hospital stay. Once you are discharged, your primary care physician will handle any further medical issues. Please note that NO REFILLS for any discharge medications will be authorized once you are discharged, as it is imperative that you return to your primary care physician (or establish a relationship with a primary care physician if you do not have one) for your post hospital discharge needs so that they can reassess your need for medications and monitor your lab values.    Time coordinating discharge: 40 minutes  SIGNED:   Shelly Coss, MD  [facility] 03/11/2020, 11:02 AM Pager [phone]  If 7PM-7AM, please contact night-coverage [email or web address] Password [email or web address]

## 2020-03-11 NOTE — Progress Notes (Signed)
Daily Progress Note   Patient Name: Vanessa Beard       Date: 03/11/2020 DOB: 07/23/1950  Age: 69 y.o. MRN#: [id number] Attending Physician: Shelly Coss, MD Primary Care Physician: Seward Carol, MD Admit Date: 03/03/2020  Reason for Consultation/Follow-up: Pain control  Subjective: I saw and examined Vanessa Beard today.  We reviewed her current pain regimen.  She reports that she still has pain that limits activities at time, but overall pain is improved with adjustments made by Dr. Hilma Favors.  She feels she will be limited, but able to be functional at home.    We discussed her desire to be at home and need to have support in the home.  Discussed plan for [facility] (through [facility]) to follow her on discharge.  Length of Stay: 8  Current Medications: Scheduled Meds:  . abacavir-dolutegravir-lamiVUDine  1 tablet Oral Daily  . dexamethasone  6 mg Oral Daily  . DULoxetine  20 mg Oral Daily  . fentaNYL  1 patch Transdermal Q72H  . gabapentin  600 mg Oral BID  . lactose free nutrition  237 mL Oral TID WC  . mupirocin ointment  1 application Nasal BID  . polyethylene glycol  17 g Oral Daily  . senna-docusate  1 tablet Oral BID    Continuous Infusions:   PRN Meds: acetaminophen **OR** acetaminophen, belladonna-opium, butamben-tetracaine-benzocaine, HYDROmorphone (DILAUDID) injection, HYDROmorphone, influenza vaccine adjuvanted, lip balm, ondansetron **OR** ondansetron (ZOFRAN) IV, zolpidem  Physical Exam       General: Alert, awake, in no acute distress.  Chronically ill appearing Heart: Regular rate and rhythm. No murmur appreciated. Lungs: Good air movement, clear Abdomen: Soft, nontender, nondistended, positive bowel sounds.  Ext: No significant  edema Skin: Warm and dry Neuro: Grossly intact, nonfocal.     Vital Signs: BP 117/75 (BP Location: Left Arm)   Pulse 67   Temp 98.3 F (36.8 C) (Oral)   Resp 14   Ht 5' (1.524 m)   Wt 57.4 kg   SpO2 92%   BMI 24.72 kg/m  SpO2: SpO2: 92 % O2 Device: O2 Device: Room Air O2 Flow Rate: O2 Flow Rate (L/min): 2 L/min  Intake/output summary:   Intake/Output Summary (Last 24 hours) at 03/11/2020 0843 Last data filed at 03/11/2020 0554 Gross per 24 hour  Intake 240  ml  Output 200 ml  Net 40 ml   LBM: Last BM Date: 03/09/20 (Multiples) Baseline Weight: Weight: 57.4 kg Most recent weight: Weight: 57.4 kg       Palliative Assessment/Data:      Patient Active Problem List   Diagnosis Date Noted  . Protein-calorie malnutrition, severe 03/05/2020  . Palliative care encounter 01/27/2020  . Goals of care, counseling/discussion 01/16/2020  . Hemorrhoid 12/16/2019  . Rectal pain 12/16/2019  . Anal cancer ([place]) 07/26/2019  . Rectal mass 07/14/2019  . Constipation 07/14/2019  . Anal fissure 07/14/2019  . Healthcare maintenance 06/30/2018  . Left knee pain 12/30/2017  . Left shoulder pain 12/30/2017  . Closed fracture of right tibial plateau 02/18/2017  . Benign hypertension 02/15/2017  . Screening examination for venereal disease 03/06/2015  . Encounter for long-term (current) use of medications 03/06/2015  . Elevated LFTs 12/11/2014  . Abnormal ultrasound 12/11/2014  . Abdominal pain, epigastric 12/11/2014  . ASCUS of cervix with negative high risk HPV 10/31/2013  . Gall bladder disease 02/03/2013  . 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  . Compression fracture of L3 lumbar vertebra 07/01/2011  . Thyroid dysfunction 07/01/2011  . Anemia 07/01/2011  . Sore throat 04/02/2011  . Hyperparathyroidism 02/10/2011  . Osteopenia 02/03/2011  . PAP SMEAR, LGSIL, ABNORMAL 12/21/2009  . COUGH 07/18/2009  . Chronic hepatitis C virus infection ([place])  12/19/2008  . LEUKORRHEA 04/07/2008  . FATIGUE 04/07/2008  . BACK PAIN, LUMBAR 11/10/2007  . ALLERGIC RHINITIS 07/09/2007  . PNEUMOCYSTIS PNEUMONIA 05/07/2007  . Essential hypertension 05/07/2007  . DENTAL CARIES 05/07/2007  . INSOMNIA 05/07/2007  . PNEUMONIA, HX OF 05/07/2007  . HERPES ZOSTER, HX OF 05/07/2007  . 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  S/p IR nerve block.  Recommendations/Plan: -Pain: MAR reveals she has required breakthrough oral dilaudid 3 times in the last 24 hours.  She tells me that she thinks medication will be "OK" but she does continue to have pain that limits her activities.   We discussed use of rescue medication and working to pretreat prior to activities that cause pain at home.  -Constipation: discussed opioid induced constipation and importance of maintaining bowel regimen.  Code Status:    Code Status Orders  (From admission, onward)         Start     Ordered   03/03/20 1954  Do not attempt resuscitation (DNR)  Continuous       Question Answer Comment  In the event of cardiac or respiratory ARREST Do not call a "code blue"   In the event of cardiac or respiratory ARREST Do not perform Intubation, CPR, defibrillation or ACLS   In the event of cardiac or respiratory ARREST Use medication by any route, position, wound care, and other measures to relive pain and suffering. May use oxygen, suction and manual treatment of airway obstruction as needed for comfort.      03/03/20 1955        Code Status History    Date Active Date Inactive Code Status Order ID Comments User Context   02/15/2017 2327 02/21/2017 1728 Full Code [id number]  Jani Gravel, MD Inpatient   Advance Care Planning Activity      Discharge Planning: Home with [place]  was discussed  with patient, RN  Thank you for allowing the Palliative Medicine Team to assist in the care of this patient.   Time In: 1940 Time Out: 2000 Total Time 20 Prolonged Time Billed No      Greater than 50%  of this time was spent counseling and coordinating care related to the above assessment and plan.  Vanessa Rough, MD  Please contact Palliative Medicine Team phone at [phone] for questions and concerns.

## 2020-03-11 NOTE — Progress Notes (Signed)
Patient discharged to home with daughter, discharge instructions reviewed with patient and daughter, who verbalized understanding. 

## 2020-03-12 ENCOUNTER — Encounter (HOSPITAL_COMMUNITY): Payer: Self-pay

## 2020-03-12 ENCOUNTER — Other Ambulatory Visit: Payer: Self-pay

## 2020-03-12 ENCOUNTER — Inpatient Hospital Stay (HOSPITAL_COMMUNITY)
Admission: EM | Admit: 2020-03-12 | Discharge: 2020-03-27 | DRG: 329 | Disposition: A | Discharge status: Home Health | Payer: Medicare HMO | Attending: Internal Medicine | Admitting: Internal Medicine

## 2020-03-12 DIAGNOSIS — M810 Age-related osteoporosis without current pathological fracture: Secondary | ICD-10-CM | POA: Diagnosis present

## 2020-03-12 DIAGNOSIS — D62 Acute posthemorrhagic anemia: Secondary | ICD-10-CM | POA: Diagnosis not present

## 2020-03-12 DIAGNOSIS — Z6825 Body mass index (BMI) 25.0-25.9, adult: Secondary | ICD-10-CM

## 2020-03-12 DIAGNOSIS — G893 Neoplasm related pain (acute) (chronic): Secondary | ICD-10-CM | POA: Diagnosis not present

## 2020-03-12 DIAGNOSIS — R52 Pain, unspecified: Secondary | ICD-10-CM | POA: Diagnosis not present

## 2020-03-12 DIAGNOSIS — Z8619 Personal history of other infectious and parasitic diseases: Secondary | ICD-10-CM | POA: Diagnosis not present

## 2020-03-12 DIAGNOSIS — K6289 Other specified diseases of anus and rectum: Secondary | ICD-10-CM

## 2020-03-12 DIAGNOSIS — Z882 Allergy status to sulfonamides status: Secondary | ICD-10-CM | POA: Diagnosis not present

## 2020-03-12 DIAGNOSIS — R159 Full incontinence of feces: Secondary | ICD-10-CM | POA: Diagnosis present

## 2020-03-12 DIAGNOSIS — I10 Essential (primary) hypertension: Secondary | ICD-10-CM | POA: Diagnosis present

## 2020-03-12 DIAGNOSIS — Z515 Encounter for palliative care: Secondary | ICD-10-CM | POA: Diagnosis not present

## 2020-03-12 DIAGNOSIS — D649 Anemia, unspecified: Secondary | ICD-10-CM | POA: Diagnosis not present

## 2020-03-12 DIAGNOSIS — B20 Human immunodeficiency virus [HIV] disease: Secondary | ICD-10-CM | POA: Diagnosis present

## 2020-03-12 DIAGNOSIS — R0902 Hypoxemia: Secondary | ICD-10-CM | POA: Diagnosis not present

## 2020-03-12 DIAGNOSIS — D630 Anemia in neoplastic disease: Secondary | ICD-10-CM | POA: Diagnosis present

## 2020-03-12 DIAGNOSIS — E876 Hypokalemia: Secondary | ICD-10-CM | POA: Diagnosis not present

## 2020-03-12 DIAGNOSIS — R69 Illness, unspecified: Secondary | ICD-10-CM | POA: Diagnosis not present

## 2020-03-12 DIAGNOSIS — Z833 Family history of diabetes mellitus: Secondary | ICD-10-CM

## 2020-03-12 DIAGNOSIS — E213 Hyperparathyroidism, unspecified: Secondary | ICD-10-CM | POA: Diagnosis present

## 2020-03-12 DIAGNOSIS — Z888 Allergy status to other drugs, medicaments and biological substances status: Secondary | ICD-10-CM | POA: Diagnosis not present

## 2020-03-12 DIAGNOSIS — Z923 Personal history of irradiation: Secondary | ICD-10-CM

## 2020-03-12 DIAGNOSIS — I119 Hypertensive heart disease without heart failure: Secondary | ICD-10-CM | POA: Diagnosis present

## 2020-03-12 DIAGNOSIS — R1084 Generalized abdominal pain: Secondary | ICD-10-CM | POA: Diagnosis not present

## 2020-03-12 DIAGNOSIS — Z79899 Other long term (current) drug therapy: Secondary | ICD-10-CM

## 2020-03-12 DIAGNOSIS — Z8249 Family history of ischemic heart disease and other diseases of the circulatory system: Secondary | ICD-10-CM

## 2020-03-12 DIAGNOSIS — Z20822 Contact with and (suspected) exposure to covid-19: Secondary | ICD-10-CM | POA: Diagnosis not present

## 2020-03-12 DIAGNOSIS — R000 Tachycardia, unspecified: Secondary | ICD-10-CM | POA: Diagnosis not present

## 2020-03-12 DIAGNOSIS — K429 Umbilical hernia without obstruction or gangrene: Secondary | ICD-10-CM | POA: Diagnosis present

## 2020-03-12 DIAGNOSIS — E43 Unspecified severe protein-calorie malnutrition: Secondary | ICD-10-CM | POA: Diagnosis not present

## 2020-03-12 DIAGNOSIS — B182 Chronic viral hepatitis C: Secondary | ICD-10-CM | POA: Diagnosis present

## 2020-03-12 DIAGNOSIS — C218 Malignant neoplasm of overlapping sites of rectum, anus and anal canal: Secondary | ICD-10-CM | POA: Diagnosis not present

## 2020-03-12 DIAGNOSIS — K219 Gastro-esophageal reflux disease without esophagitis: Secondary | ICD-10-CM | POA: Diagnosis present

## 2020-03-12 DIAGNOSIS — Z7189 Other specified counseling: Secondary | ICD-10-CM | POA: Diagnosis not present

## 2020-03-12 DIAGNOSIS — Z66 Do not resuscitate: Secondary | ICD-10-CM | POA: Diagnosis not present

## 2020-03-12 DIAGNOSIS — F32A Depression, unspecified: Secondary | ICD-10-CM | POA: Diagnosis present

## 2020-03-12 DIAGNOSIS — C210 Malignant neoplasm of anus, unspecified: Secondary | ICD-10-CM | POA: Diagnosis not present

## 2020-03-12 DIAGNOSIS — R531 Weakness: Secondary | ICD-10-CM

## 2020-03-12 DIAGNOSIS — Z9049 Acquired absence of other specified parts of digestive tract: Secondary | ICD-10-CM | POA: Diagnosis not present

## 2020-03-12 DIAGNOSIS — C44520 Squamous cell carcinoma of anal skin: Secondary | ICD-10-CM | POA: Diagnosis not present

## 2020-03-12 DIAGNOSIS — G4700 Insomnia, unspecified: Secondary | ICD-10-CM | POA: Diagnosis not present

## 2020-03-12 LAB — CBC WITH DIFFERENTIAL/PLATELET
Abs Immature Granulocytes: 0.04 10*3/uL (ref 0.00–0.07)
Basophils Absolute: 0 10*3/uL (ref 0.0–0.1)
Basophils Relative: 0 %
Eosinophils Absolute: 0 10*3/uL (ref 0.0–0.5)
Eosinophils Relative: 0 %
HCT: 28.6 % — ABNORMAL LOW (ref 36.0–46.0)
Hemoglobin: 9.1 g/dL — ABNORMAL LOW (ref 12.0–15.0)
Immature Granulocytes: 1 %
Lymphocytes Relative: 8 %
Lymphs Abs: 0.6 10*3/uL — ABNORMAL LOW (ref 0.7–4.0)
MCH: 31.8 pg (ref 26.0–34.0)
MCHC: 31.8 g/dL (ref 30.0–36.0)
MCV: 100 fL (ref 80.0–100.0)
Monocytes Absolute: 0.5 10*3/uL (ref 0.1–1.0)
Monocytes Relative: 7 %
Neutro Abs: 5.8 10*3/uL (ref 1.7–7.7)
Neutrophils Relative %: 84 %
Platelets: 144 10*3/uL — ABNORMAL LOW (ref 150–400)
RBC: 2.86 MIL/uL — ABNORMAL LOW (ref 3.87–5.11)
RDW: 17 % — ABNORMAL HIGH (ref 11.5–15.5)
WBC: 6.8 10*3/uL (ref 4.0–10.5)
nRBC: 0 % (ref 0.0–0.2)

## 2020-03-12 LAB — RESPIRATORY PANEL BY RT PCR (FLU A&B, COVID)
Influenza A by PCR: NEGATIVE
Influenza B by PCR: NEGATIVE
SARS Coronavirus 2 by RT PCR: NEGATIVE

## 2020-03-12 LAB — BASIC METABOLIC PANEL
Anion gap: 9 (ref 5–15)
BUN: 20 mg/dL (ref 8–23)
CO2: 28 mmol/L (ref 22–32)
Calcium: 9.7 mg/dL (ref 8.9–10.3)
Chloride: 102 mmol/L (ref 98–111)
Creatinine, Ser: 0.61 mg/dL (ref 0.44–1.00)
GFR, Estimated: >60 mL/min (ref >60)
Glucose, Bld: 102 mg/dL — ABNORMAL HIGH (ref 70–99)
Potassium: 3.7 mmol/L (ref 3.5–5.1)
Sodium: 139 mmol/L (ref 135–145)

## 2020-03-12 MED ORDER — BOOST PLUS PO LIQD
237.0000 mL | Freq: Three times a day (TID) | ORAL | Status: DC
  Administered 2020-03-12 – 2020-03-27 (×32): 237 mL via ORAL
  Filled 2020-03-12 (×46): qty 237

## 2020-03-12 MED ORDER — SENNOSIDES-DOCUSATE SODIUM 8.6-50 MG PO TABS
1.0000 | ORAL_TABLET | Freq: Two times a day (BID) | ORAL | Status: DC
  Administered 2020-03-12 – 2020-03-27 (×29): 1 via ORAL
  Filled 2020-03-12 (×30): qty 1

## 2020-03-12 MED ORDER — DEXAMETHASONE 4 MG PO TABS
6.0000 mg | ORAL_TABLET | Freq: Every day | ORAL | Status: AC
  Administered 2020-03-12 – 2020-03-14 (×3): 6 mg via ORAL
  Filled 2020-03-12 (×3): qty 1

## 2020-03-12 MED ORDER — ZOLPIDEM TARTRATE 5 MG PO TABS
5.0000 mg | ORAL_TABLET | Freq: Every evening | ORAL | Status: DC | PRN
  Administered 2020-03-21 – 2020-03-23 (×3): 5 mg via ORAL
  Filled 2020-03-12 (×3): qty 1

## 2020-03-12 MED ORDER — BUTAMBEN-TETRACAINE-BENZOCAINE 2-2-14 % EX AERO
1.0000 | INHALATION_SPRAY | Freq: Three times a day (TID) | CUTANEOUS | Status: DC | PRN
  Administered 2020-03-18: 1 via TOPICAL
  Filled 2020-03-12 (×3): qty 20

## 2020-03-12 MED ORDER — POLYETHYLENE GLYCOL 3350 17 G PO PACK
17.0000 g | PACK | Freq: Every day | ORAL | Status: DC
  Administered 2020-03-13 – 2020-03-17 (×2): 17 g via ORAL
  Filled 2020-03-12 (×7): qty 1

## 2020-03-12 MED ORDER — DEXAMETHASONE 4 MG PO TABS
4.0000 mg | ORAL_TABLET | Freq: Every day | ORAL | Status: AC
  Administered 2020-03-15 – 2020-03-17 (×3): 4 mg via ORAL
  Filled 2020-03-12 (×4): qty 1

## 2020-03-12 MED ORDER — BELLADONNA ALKALOIDS-OPIUM 16.2-30 MG RE SUPP
1.0000 | Freq: Four times a day (QID) | RECTAL | Status: DC | PRN
  Administered 2020-03-18: 1 via RECTAL
  Filled 2020-03-12 (×3): qty 1

## 2020-03-12 MED ORDER — HYDROMORPHONE HCL 1 MG/ML IJ SOLN
1.0000 mg | Freq: Once | INTRAMUSCULAR | Status: AC
  Administered 2020-03-12: 1 mg via INTRAVENOUS
  Filled 2020-03-12: qty 1

## 2020-03-12 MED ORDER — SUCRALFATE 1 G PO TABS
1.0000 g | ORAL_TABLET | Freq: Two times a day (BID) | ORAL | Status: DC
  Administered 2020-03-12 – 2020-03-27 (×30): 1 g via ORAL
  Filled 2020-03-12 (×30): qty 1

## 2020-03-12 MED ORDER — POTASSIUM CHLORIDE CRYS ER 20 MEQ PO TBCR
20.0000 meq | EXTENDED_RELEASE_TABLET | Freq: Every day | ORAL | Status: DC
  Administered 2020-03-13 – 2020-03-20 (×7): 20 meq via ORAL
  Filled 2020-03-12 (×8): qty 1

## 2020-03-12 MED ORDER — GABAPENTIN 300 MG PO CAPS
600.0000 mg | ORAL_CAPSULE | Freq: Two times a day (BID) | ORAL | Status: DC
  Administered 2020-03-12 – 2020-03-27 (×30): 600 mg via ORAL
  Filled 2020-03-12 (×30): qty 2

## 2020-03-12 MED ORDER — ENOXAPARIN SODIUM 40 MG/0.4ML ~~LOC~~ SOLN
40.0000 mg | Freq: Every day | SUBCUTANEOUS | Status: DC
  Administered 2020-03-12 – 2020-03-27 (×16): 40 mg via SUBCUTANEOUS
  Filled 2020-03-12 (×16): qty 0.4

## 2020-03-12 MED ORDER — HYDROMORPHONE HCL 1 MG/ML IJ SOLN
1.0000 mg | INTRAMUSCULAR | Status: DC | PRN
  Administered 2020-03-12 – 2020-03-22 (×46): 1 mg via INTRAVENOUS
  Filled 2020-03-12 (×46): qty 1

## 2020-03-12 MED ORDER — ABACAVIR-DOLUTEGRAVIR-LAMIVUD 600-50-300 MG PO TABS
1.0000 | ORAL_TABLET | Freq: Every day | ORAL | Status: DC
  Administered 2020-03-12 – 2020-03-27 (×15): 1 via ORAL
  Filled 2020-03-12 (×16): qty 1

## 2020-03-12 MED ORDER — DEXAMETHASONE 4 MG PO TABS
2.0000 mg | ORAL_TABLET | Freq: Every day | ORAL | Status: DC
  Administered 2020-03-18: 2 mg via ORAL
  Filled 2020-03-12: qty 1

## 2020-03-12 MED ORDER — FENTANYL CITRATE (PF) 100 MCG/2ML IJ SOLN
100.0000 ug | Freq: Once | INTRAMUSCULAR | Status: DC
  Filled 2020-03-12: qty 2

## 2020-03-12 MED ORDER — HYDROMORPHONE HCL 2 MG PO TABS
2.0000 mg | ORAL_TABLET | Freq: Four times a day (QID) | ORAL | Status: DC | PRN
  Administered 2020-03-12 – 2020-03-15 (×3): 2 mg via ORAL
  Filled 2020-03-12 (×3): qty 1

## 2020-03-12 MED ORDER — FENTANYL 50 MCG/HR TD PT72
1.0000 | MEDICATED_PATCH | TRANSDERMAL | Status: DC
  Administered 2020-03-13 – 2020-03-19 (×3): 1 via TRANSDERMAL
  Filled 2020-03-12 (×3): qty 1

## 2020-03-12 MED ORDER — DULOXETINE HCL 20 MG PO CPEP
20.0000 mg | ORAL_CAPSULE | Freq: Every day | ORAL | Status: DC
  Administered 2020-03-12 – 2020-03-27 (×15): 20 mg via ORAL
  Filled 2020-03-12 (×16): qty 1

## 2020-03-12 NOTE — ED Provider Notes (Signed)
[facility] DEPT Provider Note   CSN: [id number] Arrival date & time: 03/12/20  0539     History Chief Complaint  Patient presents with  . Rectal Pain    pt states she has rectal cancer and her pain became worse last night. pt also c/o abd. pain     Vanessa Beard is a 69 y.o. female.  HPI    Patient with history of hypertension, HIV, hep C, anal squamous cell carcinoma presents with recurrent rectal and abdominal pain.  Patient with recent prolonged hospital stay, was found to have a necrotic anal mass. At that time patient wished to avoid surgery, and continue with chemotherapy.  Patient had significant procedures performed to assist with pain management. Patient was also seen by palliative care as well.  Patient reports since being discharged yesterday with the pain in her anorectal region as well as abdomen is worsening.  She reports having a loose watery stool that worsens the pain. No fevers or vomiting.  No shortness of breath, but does report CP during a bowel movement. She reports lying on her side improves her symptoms, lying on her back worsens her symptoms.  Past Medical History:  Diagnosis Date  . Allergic rhinitis   . Biliary colic   . Cancer ([place])   . Gall bladder disease \  . Hepatitis C   . Herpes   . HIV infection ([place])   . Hyperparathyroidism ([place])   . Hypertension   . Insomnia   . Osteoporosis   . Pneumonia    2010    Patient Active Problem List   Diagnosis Date Noted  . Protein-calorie malnutrition, severe 03/05/2020  . Palliative care encounter 01/27/2020  . Goals of care, counseling/discussion 01/16/2020  . Hemorrhoid 12/16/2019  . Rectal pain 12/16/2019  . Anal cancer ([place]) 07/26/2019  . Rectal mass 07/14/2019  . Constipation 07/14/2019  . Anal fissure 07/14/2019  . Healthcare maintenance 06/30/2018  . Left knee pain 12/30/2017  . Left shoulder pain 12/30/2017  . Closed fracture of right tibial plateau  02/18/2017  . Benign hypertension 02/15/2017  . Screening examination for venereal disease 03/06/2015  . Encounter for long-term (current) use of medications 03/06/2015  . Elevated LFTs 12/11/2014  . Abnormal ultrasound 12/11/2014  . Abdominal pain, epigastric 12/11/2014  . ASCUS of cervix with negative high risk HPV 10/31/2013  . Gall bladder disease 02/03/2013  . 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  . Compression fracture of L3 lumbar vertebra 07/01/2011  . Thyroid dysfunction 07/01/2011  . Anemia 07/01/2011  . Sore throat 04/02/2011  . Hyperparathyroidism 02/10/2011  . Osteopenia 02/03/2011  . PAP SMEAR, LGSIL, ABNORMAL 12/21/2009  . COUGH 07/18/2009  . Chronic hepatitis C virus infection ([place]) 12/19/2008  . LEUKORRHEA 04/07/2008  . FATIGUE 04/07/2008  . BACK PAIN, LUMBAR 11/10/2007  . ALLERGIC RHINITIS 07/09/2007  . PNEUMOCYSTIS PNEUMONIA 05/07/2007  . Essential hypertension 05/07/2007  . DENTAL CARIES 05/07/2007  . INSOMNIA 05/07/2007  . PNEUMONIA, HX OF 05/07/2007  . HERPES ZOSTER, HX OF 05/07/2007  . Human immunodeficiency virus (HIV) disease ([place]) 08/04/2006    Past Surgical History:  Procedure Laterality Date  . CHOLECYSTECTOMY N/A 01/09/2015   Procedure: LAPAROSCOPIC CHOLECYSTECTOMY;  Surgeon: [name], MD;  Location: [facility];  Service: General;  Laterality: N/A;  . ECTOPIC PREGNANCY SURGERY    . ESOPHAGOGASTRODUODENOSCOPY (EGD) WITH PROPOFOL N/A 09/08/2019   Procedure: ESOPHAGOGASTRODUODENOSCOPY (EGD) WITH PROPOFOL;  Surgeon: Milus Banister, MD;  Location: [facility];  Service: Endoscopy;  Laterality: N/A;  . IR FLUORO GUIDE CV LINE LEFT  08/15/2019  . ORIF TIBIA PLATEAU Right 02/18/2017   Procedure: OPEN REDUCTION INTERNAL FIXATION (ORIF) RIGHT TIBIAL PLATEAU;  Surgeon: Leandrew Koyanagi, MD;  Location: [facility];  Service: Orthopedics;  Laterality: Right;  . SHOULDER SURGERY Right 12/2014  . UPPER ESOPHAGEAL ENDOSCOPIC ULTRASOUND (EUS)  N/A 09/08/2019   Procedure: UPPER ESOPHAGEAL ENDOSCOPIC ULTRASOUND (EUS);  Surgeon: Milus Banister, MD;  Location: [facility];  Service: Endoscopy;  Laterality: N/A;     OB History    Gravida  5   Para  3   Term  3   Preterm  0   AB  2   Living  2     SAB  0   TAB  1   Ectopic  1   Multiple  0   Live Births              Family History  Problem Relation Age of Onset  . Diabetes Mother   . Hypertension Mother   . Bone cancer Half-Brother   . Prostate cancer Half-Brother   . Colon cancer Neg Hx   . Colon polyps Neg Hx   . Esophageal cancer Neg Hx   . Pancreatic cancer Neg Hx   . Stomach cancer Neg Hx   . Liver disease Neg Hx     Social History   Tobacco Use  . Smoking status: Never Smoker  . Smokeless tobacco: Never Used  Vaping Use  . Vaping Use: Never used  Substance Use Topics  . Alcohol use: Not Currently  . Drug use: No    Home Medications Prior to Admission medications   Medication Sig Start Date End Date Taking? Authorizing Provider  belladonna-opium (B&O SUPPRETTES) 16.2-30 MG suppository Place 1 suppository rectally every 6 (six) hours as needed for pain. 03/11/20   Shelly Coss, MD  butamben-tetracaine-benzocaine (CETACAINE) 06-20-12 % spray Apply 1 spray topically as needed (for Bowel movement pain or perineal pain). 03/09/20   Shelly Coss, MD  dexamethasone (DECADRON) 2 MG tablet Take 1 tablet (2 mg total) by mouth daily. Take 3 pills daily  for 3 days then 2 pills daily for 3 days then 1 pill daily for 3 days then stop 03/12/20   Shelly Coss, MD  DULoxetine (CYMBALTA) 20 MG capsule Take 1 capsule (20 mg total) by mouth daily. 12/16/19   Heilingoetter, Cassandra L, PA-C  fentaNYL (DURAGESIC) 50 MCG/HR Place 1 patch onto the skin every 3 (three) days. 03/12/20   Shelly Coss, MD  gabapentin (NEURONTIN) 300 MG capsule Take 2 capsules (600 mg total) by mouth 2 (two) times daily. 03/11/20   Shelly Coss, MD  HYDROmorphone  (DILAUDID) 2 MG tablet Take 1 tablet (2 mg total) by mouth every 6 (six) hours as needed for severe pain. 03/09/20   Shelly Coss, MD  polyethylene glycol (MIRALAX / GLYCOLAX) 17 g packet Take 17 g by mouth daily. 03/12/20   Shelly Coss, MD  potassium chloride SA (KLOR-CON) 20 MEQ tablet Take 1 tablet (20 mEq total) by mouth daily. 02/17/20   Heilingoetter, Cassandra L, PA-C  senna-docusate (SENOKOT-S) 8.6-50 MG tablet Take 1 tablet by mouth 2 (two) times daily. 03/11/20   Shelly Coss, MD  sucralfate (CARAFATE) 1 g tablet TAKE 2 TABLETS AND MIX IN 4.5ML OF WATER AND PLACE IN ENEMA APPLICATOR USE TWICE DAILY Patient taking differently: Take 2 g by mouth daily.  02/13/20   Heilingoetter, Cassandra L, PA-C  TRIUMEQ 119-41-740 MG tablet TAKE 1 TABLET BY MOUTH DAILY Patient taking differently: Take 1 tablet by mouth daily.  12/08/19   Carlyle Basques, MD  zolpidem (AMBIEN) 10 MG tablet Take 0.5 tablets (5 mg total) by mouth at bedtime as needed. 03/11/20   Shelly Coss, MD  prochlorperazine (COMPAZINE) 10 MG tablet Take 1 tablet (10 mg total) by mouth every 6 (six) hours as needed (Nausea or vomiting). Patient not taking: Reported on 11/08/2019 07/26/19 01/16/20  Truitt Merle, MD    Allergies    Dapsone, Retrovir [zidovudine], and Sulfamethoxazole-trimethoprim  Review of Systems   Review of Systems  Constitutional: Negative for fever.  Respiratory: Negative for shortness of breath.   Gastrointestinal: Negative for vomiting.  All other systems reviewed and are negative.   Physical Exam Updated Vital Signs BP 140/90   Pulse 69   Temp 98.2 F (36.8 C)   Resp 18   Ht 1.524 m (5')   Wt 58.5 kg   SpO2 97%   BMI 25.19 kg/m   Physical Exam CONSTITUTIONAL: Elderly, uncomfortable appearing, lying on her side HEAD: Normocephalic/atraumatic EYES: EOMI ENMT: Mucous membranes moist NECK: supple no meningeal signs CV: S1/S2 noted, no murmurs/rubs/gallops noted LUNGS: Lungs are clear to  auscultation bilaterally, no apparent distress ABDOMEN: soft, nontender GU:no cva tenderness  Rectal-loose stool noted that is nonbloody, significant erythema noted, female chaperone present for exam NEURO: Pt is awake/alert/appropriate, moves all extremitiesx4.  No facial droop.   EXTREMITIES:  full ROM SKIN: warm, color normal PSYCH: no abnormalities of mood noted, alert and oriented to situation  ED Results / Procedures / Treatments   Labs (all labs ordered are listed, but only abnormal results are displayed) Labs Reviewed  CBC WITH DIFFERENTIAL/PLATELET - Abnormal; Notable for the following components:      Result Value   RBC 2.86 (*)    Hemoglobin 9.1 (*)    HCT 28.6 (*)    RDW 17.0 (*)    Platelets 144 (*)    Lymphs Abs 0.6 (*)    All other components within normal limits  BASIC METABOLIC PANEL - Abnormal; Notable for the following components:   Glucose, Bld 102 (*)    All other components within normal limits  RESPIRATORY PANEL BY RT PCR (FLU A&B, COVID)    EKG None  Radiology No results found.  Procedures Procedures  Medications Ordered in ED Medications  butamben-tetracaine-benzocaine (CETACAINE) spray 1 spray (has no administration in time range)  HYDROmorphone (DILAUDID) injection 1 mg (1 mg Intravenous Given 03/12/20 8144)    ED Course  I have reviewed the triage vital signs and the nursing notes.  Pertinent labs results that were available during my care of the patient were reviewed by me and considered in my medical decision making (see chart for details).    MDM Rules/Calculators/A&P                          6:36 AM Patient with significant medical history presents after recent admission for necrotic ano-rectal mass.  Patient had difficult pain control and was in the hospital for several days.  She elected to avoid operative management and continue with chemotherapy. Patient did have a Port-A-Cath placed during that stay.  She also underwent  CT-guided nerve blocks.  However her pain is not well managed at home.  There was a consideration for a skilled nursing facility. We will treat pain in the ED 7:17 AM Discussed with Dr. Laurel Dimmer  [place] for admission to the hospital.  Final Clinical Impression(s) / ED Diagnoses Final diagnoses:  Intractable pain  Rectal pain    Rx / DC Orders ED Discharge Orders    None       Ripley Fraise, MD 03/12/20 [phone]

## 2020-03-12 NOTE — TOC Initial Note (Signed)
Transition of Care [facility]) - Initial/Assessment Note    Patient Details  Name: Vanessa Beard MRN: [id number] Date of Birth: 23-Jul-1950  [facility] CM/SW Contact:    Lynnell Catalan, RN Phone Number: 03/12/2020, 1:55 PM  Clinical Narrative:                 Pt discharged from hospital to home yesterday. She has returned today and family is wanting SNF placement. She had declined SNF placement on last admission. FL2 faxed out to area SNFs. Will await bed offers.  Expected Discharge Plan: [place]     Patient Goals and CMS Choice        Expected Discharge Plan and Services Expected Discharge Plan: [place]   Discharge Planning Services: CM Consult   Living arrangements for the past 2 months: Apartment                     Prior Living Arrangements/Services Living arrangements for the past 2 months: Apartment Lives with:: Self            Activities of Daily Living Home Assistive Devices/Equipment: Other (Comment), Shower chair without back, Environmental consultant (specify type), Eyeglasses (front wheeled walker, standard toilet, tub/shower unit) ADL Screening (condition at time of admission) Patient's cognitive ability adequate to safely complete daily activities?: Yes Is the patient deaf or have difficulty hearing?: No Does the patient have difficulty seeing, even when wearing glasses/contacts?: No Does the patient have difficulty concentrating, remembering, or making decisions?: Yes (noticed a little problem) Patient able to express need for assistance with ADLs?: Yes Does the patient have difficulty dressing or bathing?: No Independently performs ADLs?: Yes (appropriate for developmental age) Does the patient have difficulty walking or climbing stairs?: Yes Weakness of Legs: Both Weakness of Arms/Hands: None  Admission diagnosis:  Rectal pain [K62.89] Intractable pain [R52] Patient Active Problem List   Diagnosis Date Noted  .  Protein-calorie malnutrition, severe 03/05/2020  . Palliative care encounter 01/27/2020  . Goals of care, counseling/discussion 01/16/2020  . Hemorrhoid 12/16/2019  . Rectal pain 12/16/2019  . Anal cancer ([place]) 07/26/2019  . Rectal mass 07/14/2019  . Constipation 07/14/2019  . Anal fissure 07/14/2019  . Healthcare maintenance 06/30/2018  . Left knee pain 12/30/2017  . Left shoulder pain 12/30/2017  . Closed fracture of right tibial plateau 02/18/2017  . Benign hypertension 02/15/2017  . Screening examination for venereal disease 03/06/2015  . Encounter for long-term (current) use of medications 03/06/2015  . Elevated LFTs 12/11/2014  . Abnormal ultrasound 12/11/2014  . Abdominal pain, epigastric 12/11/2014  . ASCUS of cervix with negative high risk HPV 10/31/2013  . Gall bladder disease 02/03/2013  . 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  . Compression fracture of L3 lumbar vertebra 07/01/2011  . Thyroid dysfunction 07/01/2011  . Anemia 07/01/2011  . Sore throat 04/02/2011  . Hyperparathyroidism 02/10/2011  . Osteopenia 02/03/2011  . PAP SMEAR, LGSIL, ABNORMAL 12/21/2009  . COUGH 07/18/2009  . Chronic hepatitis C virus infection ([place]) 12/19/2008  . LEUKORRHEA 04/07/2008  . FATIGUE 04/07/2008  . BACK PAIN, LUMBAR 11/10/2007  . ALLERGIC RHINITIS 07/09/2007  . PNEUMOCYSTIS PNEUMONIA 05/07/2007  . Essential hypertension 05/07/2007  . DENTAL CARIES 05/07/2007  . INSOMNIA 05/07/2007  . PNEUMONIA, HX OF 05/07/2007  . HERPES ZOSTER, HX OF 05/07/2007  . Human immunodeficiency virus (HIV) disease ([place]) 08/04/2006   PCP:  Seward Carol, MD Pharmacy:   [facility] [address],  [place] [address] Phone: [phone] Fax: [place], [place] [address] [place] [address] Phone:  [phone] Fax: [phone]     Social Determinants of Health (SDOH) Interventions    Readmission Risk Interventions Readmission Risk Prevention Plan 03/09/2020  Transportation Screening Complete  PCP or Specialist Appt within 3-5 Days Complete  HRI or [place] Complete  Social Work Consult for [place] Planning/Counseling Complete  Palliative Care Screening Complete  Medication Review Press photographer) Complete  Some recent data might be hidden

## 2020-03-12 NOTE — Progress Notes (Signed)
HEMATOLOGY-ONCOLOGY PROGRESS NOTE  SUBJECTIVE: Patient was discharged home yesterday, and came back to emergency room for intractable rectal pain.  She was admitted for further pain management.  She denies any fever at home.  She was moaning in the bed when I saw her with stool incontinence on the pad.  Oncology History Overview Note  Cancer Staging Anal cancer ([place]) Staging form: Anus, AJCC 8th Edition - Clinical: Stage IV (cT2, cN0, cM1) - Signed by Truitt Merle, MD on 08/12/2019    Anal cancer ([place])  07/19/2019 Procedure   Colonoscopy by Dr Havery Moros 07/19/19  IMPRESSION - Ulcer noted at the anal canal in posterior midline canal that extends into the distal rectum on digital rectal exam, nodular and somewhat hard to palpation. Endoscopic images show nodular tissue in the area, concerning for malignant ulcer. - The examined portion of the ileum was normal. - One 3 mm polyp at the hepatic flexure, removed with a cold snare. Resected and retrieved. - One 5 mm polyp in the transverse colon, removed with a cold snare. Resected and retrieved. - Diverticulosis in the ascending colon. - The examination was otherwise normal.   07/19/2019 Initial Biopsy   Diagnosis 07/19/19 1. Transverse Colon Polyp, hepatic flexure (2) - TUBULAR ADENOMA (1 OF 3 FRAGMENTS) - BENIGN COLONIC MUCOSA (2 OF 3 FRAGMENTS) - NO HIGH GRADE DYSPLASIA OR MALIGNANCY IDENTIFIED 2. Rectum, biopsy, distal rectal anal canal - SQUAMOUS CELL CARCINOMA - SEE COMMENT   07/26/2019 Initial Diagnosis   Anal cancer ([place])   07/26/2019 Cancer Staging   Staging form: Anus, AJCC 8th Edition - Clinical: Stage IV (cT2, cN1c, cM1) - Signed by Truitt Merle, MD on 08/12/2019   08/04/2019 PET scan   IMPRESSION: Hypermetabolic anal soft tissue mass, consistent with known primary anal carcinoma.   Sub-cm hypermetabolic lymph nodes in posterior perirectal space, right inguinal region, right iliac chain, and gastrohepatic ligament, suspicious for  metastatic disease.   Small hypermetabolic left axillary lymph nodes. This would be unusual location for metastatic anal carcinoma. Recommend clinical correlation for possibility the patient has had recent COVID vaccination in the left arm, which could explain this finding.   Multifocal airspace disease in both lower lungs with marked hypermetabolic activity. This favors infectious or inflammatory etiology, and is not typical for pulmonary metastases. Short-term follow-up by chest CT is recommended.   08/15/2019 - 10/05/2019 Radiation Therapy   Concurrent ChemoRT with Dr. Lisbeth Renshaw starting 08/15/19-10/05/19   08/15/2019 - 09/12/2019 Chemotherapy   Concurrent ChemoRT with Mitomycin and 5FU on week 1 and 5 starting 08/15/19 with week 5 dose on 09/12/19.    11/02/2019 Imaging   CT CAP w contrast  IMPRESSION: No acute findings in the chest, abdomen or pelvis.   Biapical and bibasilar scarring. Bronchiectasis and paraseptal emphysema in the lung bases.   Tortuous aorta.   Small umbilical hernia containing fat.   01/05/2020 Imaging   PET SCAN IMPRESSION: 1. Partial metabolic response. Persistent anal mass hypermetabolism, decreased. No residual hypermetabolic nodal metastases in the abdomen or pelvis. Left axillary hypermetabolic lymph nodes are decreased in size and metabolism. No new or progressive hypermetabolic metastatic disease. 2. Hypermetabolism associated with patchy consolidative airspace opacities in the mid to lower lungs bilaterally, decreased in extent and metabolism, favoring resolving inflammatory opacities. 3. Aortic Atherosclerosis (ICD10-I70.0) and Emphysema (ICD10-J43.9).   01/27/2020 - 02/17/2020 Chemotherapy   The patient had pembrolizumab (KEYTRUDA) 200 mg in sodium chloride 0.9 % 50 mL chemo infusion, 200 mg, Intravenous, Once, 2 of 6 cycles  Administration: 200 mg (01/27/2020), 200 mg (02/17/2020)  for chemotherapy treatment.    03/14/2020 -  Chemotherapy   The  patient had dexamethasone (DECADRON) 4 MG tablet, 8 mg, Oral, Daily, 0 of 1 cycle, Start date: --, End date: -- palonosetron (ALOXI) injection 0.25 mg, 0.25 mg, Intravenous,  Once, 0 of 4 cycles leucovorin 624 mg in dextrose 5 % 250 mL infusion, 400 mg/m2 = 624 mg, Intravenous,  Once, 0 of 4 cycles oxaliplatin (ELOXATIN) 110 mg in dextrose 5 % 500 mL chemo infusion, 70 mg/m2 = 110 mg (100 % of original dose 70 mg/m2), Intravenous,  Once, 0 of 4 cycles Dose modification: 70 mg/m2 (original dose 70 mg/m2, Cycle 1, Reason: Provider Judgment) fluorouracil (ADRUCIL) 3,100 mg in sodium chloride 0.9 % 88 mL chemo infusion, 2,000 mg/m2 = 3,100 mg (100 % of original dose 2,000 mg/m2), Intravenous, 1 Day/Dose, 0 of 4 cycles Dose modification: 2,000 mg/m2 (original dose 2,000 mg/m2, Cycle 1, Reason: Provider Judgment)  for chemotherapy treatment.       REVIEW OF SYSTEMS:   Constitutional: Denies fevers, chills  Eyes: Denies blurriness of vision Ears, nose, mouth, throat, and face: Denies mucositis or sore throat Respiratory: Denies cough, dyspnea or wheezes Cardiovascular: Denies palpitation, chest discomfort Gastrointestinal: Continues to have rectal pain Skin: Denies abnormal skin rashes Lymphatics: Denies new lymphadenopathy or easy bruising Neurological:Denies numbness, tingling or new weaknesses Behavioral/Psych: Mood is stable, no new changes  Extremities: No lower extremity edema All other systems were reviewed with the patient and are negative.  I have reviewed the past medical history, past surgical history, social history and family history with the patient and they are unchanged from previous note.   PHYSICAL EXAMINATION: ECOG PERFORMANCE STATUS: 3 - Symptomatic, >50% confined to bed  Vitals:   03/12/20 0943 03/12/20 1408  BP: (!) 145/86 (!) 153/88  Pulse: 68 70  Resp: 15 15  Temp: 98.3 F (36.8 C) 98.3 F (36.8 C)  SpO2: 98% 93%   Filed Weights   03/12/20 0550  Weight:  129 lb (58.5 kg)    Intake/Output from previous day: No intake/output data recorded.  GENERAL:alert, no distress and comfortable SKIN: skin color, texture, turgor are normal, no rashes or significant lesions LUNGS: clear to auscultation and percussion with normal breathing effort HEART: regular rate & rhythm and no murmurs and no lower extremity edema ABDOMEN:abdomen soft, non-tender and normal bowel sounds RECTAL: Ulcerated mass at the anal verge with loose stool in perianal area and pad in bed, with mild blood  Musculoskeletal:no cyanosis of digits and no clubbing  NEURO: alert & oriented x 3 with fluent speech, no focal motor/sensory deficits  LABORATORY DATA:  I have reviewed the data as listed CMP Latest Ref Rng & Units 03/12/2020 03/05/2020 03/04/2020  Glucose 70 - 99 mg/dL 102(H) 87 89  BUN 8 - 23 mg/dL 20 9 11   Creatinine 0.44 - 1.00 mg/dL 0.61 0.59 0.59  Sodium 135 - 145 mmol/L 139 132(L) 132(L)  Potassium 3.5 - 5.1 mmol/L 3.7 3.9 4.0  Chloride 98 - 111 mmol/L 102 101 101  CO2 22 - 32 mmol/L 28 24 25   Calcium 8.9 - 10.3 mg/dL 9.7 9.4 9.4  Total Protein 6.5 - 8.1 g/dL - - -  Total Bilirubin 0.3 - 1.2 mg/dL - - -  Alkaline Phos 38 - 126 U/L - - -  AST 15 - 41 U/L - - -  ALT 0 - 44 U/L - - -    Lab Results  Component Value Date   WBC 6.8 03/12/2020   HGB 9.1 (L) 03/12/2020   HCT 28.6 (L) 03/12/2020   MCV 100.0 03/12/2020   PLT 144 (L) 03/12/2020   NEUTROABS 5.8 03/12/2020    CT GUIDED NEEDLE PLACEMENT  Result Date: 03/06/2020 INDICATION: 69 year old female with anal cancer, non remaining pain, referred for pudendal nerve block EXAM: CT GUIDANCE NEEDLE PLACEMENT MEDICATIONS: None. ANESTHESIA/SEDATION: None FLUOROSCOPY TIME:  CT COMPLICATIONS: None PROCEDURE: Informed written consent was obtained from the patient after a thorough discussion of the procedural risks, benefits and alternatives. All questions were addressed. Maximal Sterile Barrier Technique was utilized  including caps, mask, sterile gowns, sterile gloves, sterile drape, hand hygiene and skin antiseptic. A timeout was performed prior to the initiation of the procedure. Patient was position prone position on the CT gantry table. Scout CT was acquired for planning purposes. The patient was then prepped and draped in the usual sterile fashion. 1% lidocaine was used for local anesthesia. Using CT guidance, bilateral transgluteal approach was performed using 22 gauge 15 cm Chiba needles, targeting the pudendal canal within the bilateral pelvis. Once we confirmed needle tip position with CT, small amount of 40% dilute contrast was injected to assure that the needle tip was not in a vascular space. We then injected a solution of 8 cc 5% bupivacaine and 1 mL (40 mg) Kenalog at both needle sites. Needles were removed and a final CT was performed. Patient tolerated the procedure well and remained hemodynamically stable throughout. No complications were encountered and no significant blood loss. IMPRESSION: Status post CT-guided needle placement at the bilateral pudendal canal for bilateral pudendal nerve block Signed, Dulcy Fanny. Dellia Nims, RPVI [facility] Electronically Signed   By: Corrie Mckusick D.O.   On: 03/06/2020 16:31   CT GUIDED NEEDLE PLACEMENT  Result Date: 03/06/2020 INDICATION: 69 year old female with anal cancer, non remaining pain, referred for pudendal nerve block EXAM: CT GUIDANCE NEEDLE PLACEMENT MEDICATIONS: None. ANESTHESIA/SEDATION: None FLUOROSCOPY TIME:  CT COMPLICATIONS: None PROCEDURE: Informed written consent was obtained from the patient after a thorough discussion of the procedural risks, benefits and alternatives. All questions were addressed. Maximal Sterile Barrier Technique was utilized including caps, mask, sterile gowns, sterile gloves, sterile drape, hand hygiene and skin antiseptic. A timeout was performed prior to the initiation  of the procedure. Patient was position prone position on the CT gantry table. Scout CT was acquired for planning purposes. The patient was then prepped and draped in the usual sterile fashion. 1% lidocaine was used for local anesthesia. Using CT guidance, bilateral transgluteal approach was performed using 22 gauge 15 cm Chiba needles, targeting the pudendal canal within the bilateral pelvis. Once we confirmed needle tip position with CT, small amount of 40% dilute contrast was injected to assure that the needle tip was not in a vascular space. We then injected a solution of 8 cc 5% bupivacaine and 1 mL (40 mg) Kenalog at both needle sites. Needles were removed and a final CT was performed. Patient tolerated the procedure well and remained hemodynamically stable throughout. No complications were encountered and no significant blood loss. IMPRESSION: Status post CT-guided needle placement at the bilateral pudendal canal for bilateral pudendal nerve block Signed, Dulcy Fanny. Dellia Nims, RPVI [facility] Electronically Signed   By: Corrie Mckusick D.O.   On: 03/06/2020 16:31   CT ABDOMEN PELVIS W CONTRAST  Result Date: 03/01/2020 CLINICAL DATA:  Colorectal cancer, new  anal mass, difficulty voiding EXAM: CT ABDOMEN AND PELVIS WITH CONTRAST TECHNIQUE: Multidetector CT imaging of the abdomen and pelvis was performed using the standard protocol following bolus administration of intravenous contrast. CONTRAST:  157mL OMNIPAQUE IOHEXOL 300 MG/ML  SOLN COMPARISON:  11/02/2019 FINDINGS: Lower chest: No acute abnormality. Fibrotic scarring of the included bilateral lung bases. Hepatobiliary: No focal liver abnormality is seen. Status post cholecystectomy. Postoperative biliary dilatation. Pancreas: Unremarkable. No pancreatic ductal dilatation or surrounding inflammatory changes. Spleen: Normal in size without significant abnormality. Adrenals/Urinary Tract: Adrenal  glands are unremarkable. Kidneys are normal, without renal calculi, solid lesion, or hydronephrosis. Bladder is unremarkable. Stomach/Bowel: Stomach is within normal limits. Appendix appears normal. There is an irregular, rim enhancing soft tissue mass about the anus, containing air and fluid, measuring overall approximately 5.4 x 2.4 cm (series 2, image 76). No significant change in perirectal and presacral soft tissue stranding (series 2, image 67). Vascular/Lymphatic: Scattered aortic atherosclerosis. No enlarged abdominal or pelvic lymph nodes. Reproductive: Small calcified uterine fibroids. Other: No abdominal wall hernia or abnormality. No abdominopelvic ascites. Musculoskeletal: No acute or significant osseous findings. Unchanged superior endplate deformities of T12 and L4. IMPRESSION: 1. There is an irregular, rim enhancing soft tissue mass about the anus, containing air and fluid, measuring overall approximately 5.4 x 2.4 cm. Findings most suggestive of necrotic anorectal mass. Correlate with physical exam findings. 2. Unchanged fat stranding of the perirectal and presacral soft tissues, in keeping with prior radiation therapy. 3. No evidence of lymphadenopathy or metastatic disease in the abdomen or pelvis. 4. Aortic Atherosclerosis (ICD10-I70.0). Electronically Signed   By: Eddie Candle M.D.   On: 03/01/2020 13:57   DG Chest Port 1 View  Result Date: 03/09/2020 CLINICAL DATA:  Status post Port-A-Cath placement. EXAM: PORTABLE CHEST 1 VIEW COMPARISON:  May 04, 2018. FINDINGS: Stable cardiomegaly. Interval placement of right internal jugular Port-A-Cath with distal tip in expected position of the right brachiocephalic vein. No pneumothorax is noted. Mild bibasilar subsegmental atelectasis is noted. Bony thorax is unremarkable. IMPRESSION: Interval placement of right internal jugular Port-A-Cath with distal tip in expected position of the right brachiocephalic vein. Electronically Signed   By:  Marijo Conception M.D.   On: 03/09/2020 09:25   DG C-Arm 1-60 Min-No Report  Result Date: 03/09/2020 Fluoroscopy was utilized by the requesting physician.  No radiographic interpretation.    ASSESSMENT AND PLAN: 1.  Anal squamous cell carcinoma, local recurrence with intractable pain  2.  Anemia 3.  HIV 4.  History of hepatitis C 5.  Hypertension 6.  Hyperparathyroidism 7.  Osteoporosis  -her worsening anal pain is related to her cancer recurrence. It has been extremely difficult to control her pain.  She is on high-dose narcotics, and had nerve block which did not help much. -With a stool incontinence, which causes more local symptoms, I wonder she needs diverting colostomy.  I will send a message to general surgeon, please consult them -I also sent a message to Dr. Hilma Favors, who has been managing her pain.  She will be out of office this week, please consult the palliative care service. -I think it's reasonable to repeat CT pelvis to see if she has fistular or abscess.  -I will f/u when she is in house.    LOS: 0 days   Truitt Merle,  03/12/20

## 2020-03-12 NOTE — ED Notes (Signed)
Report received from night shift. Pt resting quietly in bed. No distress noted. Awaiting bed assignment.

## 2020-03-12 NOTE — H&P (Signed)
History and Physical  Vanessa Beard JJO:[id number] DOB: April 29, 1951 DOA: 03/12/2020  Referring physician: Julious Oka, ER physician PCP: Seward Carol, MD  Outpatient Specialists: Dr. Graylon Good, infectious disease, Dr. 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  After discussion with surgery and oncology, patient opted for conservative management, wanting to avoid a palliative diverting colostomy.  She underwent an interventional radiology CT-guided nerve block and palliative care assisted for pain management.  It was recommended she go to a skilled nursing facility, but patient declined this and she was discharged yesterday, 10/24 on fentanyl patch, oral Dilaudid, Neurontin, tapering Decadron and local anesthetics.  After discharged home, patient continued to have episodes of pain which were intractable and she came back into the emergency room on the early morning of 10/25, complaining of persistent intractable rectal pain.  ED Course: Patient was given IV Dilaudid and local anesthetics and she admitted that she did not think she was going to be able to care for herself at home.  She confirms that she still does not want surgery, but is willing to consider skilled nursing.  Hospitalist were called for further evaluation.  Review of Systems: Patient seen in the emergency room. Pt complains of fatigue, rectal pain  Pt denies any headaches, vision changes, dysphagia, chest pain, palpitations, shortness of breath, wheeze, cough, abdominal pain, hematuria, dysuria, constipation, focal extremity numbness weakness or pain.  Review of systems  are otherwise negative   Past Medical History:  Diagnosis Date  . Allergic rhinitis   . Biliary colic   . Cancer ([place])   . Gall bladder disease \  . Hepatitis C   . Herpes   . HIV infection ([place])   . Hyperparathyroidism ([place])   . Hypertension   . Insomnia   . Osteoporosis   . Pneumonia    2010   Past Surgical History:  Procedure Laterality Date  . CHOLECYSTECTOMY N/A 01/09/2015   Procedure: LAPAROSCOPIC CHOLECYSTECTOMY;  Surgeon: [name], MD;  Location: [facility];  Service: General;  Laterality: N/A;  . ECTOPIC PREGNANCY SURGERY    . ESOPHAGOGASTRODUODENOSCOPY (EGD) WITH PROPOFOL N/A 09/08/2019   Procedure: ESOPHAGOGASTRODUODENOSCOPY (EGD) WITH PROPOFOL;  Surgeon: Milus Banister, MD;  Location: [facility];  Service: Endoscopy;  Laterality: N/A;  . IR FLUORO GUIDE CV LINE LEFT  08/15/2019  . ORIF TIBIA PLATEAU Right 02/18/2017   Procedure: OPEN REDUCTION INTERNAL FIXATION (ORIF) RIGHT TIBIAL PLATEAU;  Surgeon: Leandrew Koyanagi, MD;  Location: [facility];  Service: Orthopedics;  Laterality: Right;  . SHOULDER SURGERY Right 12/2014  . UPPER ESOPHAGEAL ENDOSCOPIC ULTRASOUND (EUS) N/A 09/08/2019   Procedure: UPPER ESOPHAGEAL ENDOSCOPIC ULTRASOUND (EUS);  Surgeon: Milus Banister, MD;  Location: [facility];  Service: Endoscopy;  Laterality: N/A;    Social History:  reports that she has never smoked. She has never used smokeless tobacco. She reports previous alcohol use. She reports that she does not use drugs.  Lives at home in the care of her daughter   Allergies  Allergen Reactions  . Dapsone Nausea Only    stomach  burning  . Retrovir [Zidovudine] Other (See Comments)    Changed skin color  . Sulfamethoxazole-Trimethoprim  Other (See Comments)    Flu like symptoms    Family History  Problem Relation Age of Onset  . Diabetes Mother   . Hypertension Mother   . Bone cancer Half-Brother   . Prostate cancer Half-Brother   . Colon cancer Neg Hx   . Colon polyps Neg Hx   .  Esophageal cancer Neg Hx   . Pancreatic cancer Neg Hx   . Stomach cancer Neg Hx   . Liver disease Neg Hx       Prior to Admission medications   Medication Sig Start Date End Date Taking? Authorizing Provider  belladonna-opium (B&O SUPPRETTES) 16.2-30 MG suppository Place 1 suppository rectally every 6 (six) hours as needed for pain. 03/11/20   Shelly Coss, MD  butamben-tetracaine-benzocaine (CETACAINE) 06-20-12 % spray Apply 1 spray topically as needed (for Bowel movement pain or perineal pain). 03/09/20   Shelly Coss, MD  dexamethasone (DECADRON) 2 MG tablet Take 1 tablet (2 mg total) by mouth daily. Take 3 pills daily  for 3 days then 2 pills daily for 3 days then 1 pill daily for 3 days then stop 03/12/20   Shelly Coss, MD  DULoxetine (CYMBALTA) 20 MG capsule Take 1 capsule (20 mg total) by mouth daily. 12/16/19   Heilingoetter, Cassandra L, PA-C  fentaNYL (DURAGESIC) 50 MCG/HR Place 1 patch onto the skin every 3 (three) days. 03/12/20   Shelly Coss, MD  gabapentin (NEURONTIN) 300 MG capsule Take 2 capsules (600 mg total) by mouth 2 (two) times daily. 03/11/20   Shelly Coss, MD  HYDROmorphone (DILAUDID) 2 MG tablet Take 1 tablet (2 mg total) by mouth every 6 (six) hours as needed for severe pain. 03/09/20   Shelly Coss, MD  polyethylene glycol (MIRALAX / GLYCOLAX) 17 g packet Take 17 g by mouth daily. 03/12/20   Shelly Coss, MD  potassium chloride SA (KLOR-CON) 20 MEQ tablet Take 1 tablet (20 mEq total) by mouth daily. 02/17/20   Heilingoetter, Cassandra L, PA-C  senna-docusate (SENOKOT-S) 8.6-50 MG tablet Take 1 tablet by mouth 2 (two) times daily. 03/11/20   Shelly Coss, MD  sucralfate (CARAFATE) 1 g tablet TAKE 2 TABLETS AND MIX IN 4.5ML OF WATER AND PLACE IN ENEMA APPLICATOR USE TWICE DAILY Patient taking differently: Take 2 g by mouth daily.  02/13/20   Heilingoetter, Cassandra L, PA-C  TRIUMEQ 600-50-300 MG tablet TAKE 1 TABLET BY MOUTH DAILY Patient taking  differently: Take 1 tablet by mouth daily.  12/08/19   Carlyle Basques, MD  zolpidem (AMBIEN) 10 MG tablet Take 0.5 tablets (5 mg total) by mouth at bedtime as needed. 03/11/20   Shelly Coss, MD  prochlorperazine (COMPAZINE) 10 MG tablet Take 1 tablet (10 mg total) by mouth every 6 (six) hours as needed (Nausea or vomiting). Patient not taking: Reported on 11/08/2019 07/26/19 01/16/20  Truitt Merle, MD    Physical Exam: BP 121/79   Pulse 62   Temp 98.2 F (36.8 C)   Resp 11   Ht 5' (1.524 m)   Wt 58.5 kg   SpO2 96%   BMI 25.19 kg/m    General: Alert and oriented x3, mild distress, fatigued  HEENT: Normocephalic and atraumatic, mucous membranes slightly dry  Neck: Supple, no  Cardiovascular: Regular rate and rhythm, S1-S2  Respiratory: Clear to auscultation bilaterally  Abdomen: Soft, nontender, nondistended, hypoactive bowel sounds  Musculoskeletal: No clubbing or cyanosis or edema  Skin: No skin breaks, tears or lesions  Psychiatry: Appropriate, no evidence of psychosis  Labs on Admission:  Basic Metabolic Panel: Recent Labs  Lab 03/12/20 0626  NA 139  K 3.7  CL 102  CO2 28  GLUCOSE 102*  BUN 20  CREATININE 0.61  CALCIUM 9.7   Liver Function Tests: No results for input(s): AST, ALT, ALKPHOS, BILITOT, PROT, ALBUMIN in the last 168 hours. No results for input(s): LIPASE, AMYLASE in the last 168 hours. No results for input(s): AMMONIA in the last 168 hours. CBC: Recent Labs  Lab 03/08/20 0520 03/12/20 0626  WBC 9.1 6.8  NEUTROABS 8.1* 5.8  HGB 9.5* 9.1*  HCT 29.3* 28.6*  MCV 97.0 100.0  PLT 199 144*   Cardiac Enzymes: No results for input(s): CKTOTAL, CKMB, CKMBINDEX, TROPONINI in the last 168 hours.  BNP (last 3 results) No results for input(s): BNP in the last 8760 hours.  ProBNP (last 3 results) No results for input(s): PROBNP in the last 8760 hours.  CBG: No results for input(s): GLUCAP in the last 168 hours.  Radiological Exams  on Admission: No results found.  EKG: Not done  Assessment/Plan Present on Admission: . Rectal pain . Human immunodeficiency virus (HIV) disease ([place]) . Chronic hepatitis C virus infection ([place]) . Essential hypertension . Anal cancer ([place]) . Protein-calorie malnutrition, severe  Principal Problem:   Rectal pain: Intractable, requiring IV pain medications.  Have restarted her regimen as per palliative care including steroids, IV Dilaudid as needed, Duragesic patch, local anesthetics and Neurontin. Active Problems:   Human immunodeficiency virus (HIV) disease ([place]): CD4 count last checked at 76 during previous hospitalization.  Not currently on pneumocystis pneumonia prophylaxis due to intolerance of Bactrim and dapsone.  Patient follows with infectious disease.    Chronic hepatitis C virus infection: Status post treatment in 2018.  Essential hypertension: During previous hospitalization, with starting of pain control, patient's blood pressures became soft and blood pressure medications were discontinued.    Anal cancer [facility]): On Keytruda.  Will notify oncology of patient's return.    Protein-calorie malnutrition, severe: Reviewed consult from nutrition from few days ago.  Patient meets criteria for severe malnutrition is related to her chronic illness from cancer and cancer related treatments as evidenced by her percentage of weight loss, severe fat and muscle depletion. Patient on regular diet plus boost 3 times daily  DVT prophylaxis: Lovenox  Code Status: DNR  Family Communication: We will update daughter by phone  Disposition Plan: Anticipate discharge when social work is able to find a skilled nursing facility  Consults called: None at this time, but will notify oncology and palliative care of patient's rehospitalization  Admission status: Inpatient given need for continued IV pain control    Annita Brod MD [facility] Pager [phone]  If 7PM-7AM,  please contact night-coverage [email or web address] Password [facility]  03/12/2020, 9:33 AM

## 2020-03-12 NOTE — ED Notes (Signed)
Report called to [place] on 6E. Pt to [place] via stretcher.

## 2020-03-12 NOTE — Progress Notes (Signed)
OT Cancellation Note  Patient Details Name: Vanessa Beard MRN: [id number] DOB: April 03, 1951   Cancelled Treatment:    Reason Eval/Treat Not Completed: Other (comment). Pt c/o of 10/10 pain, RN gave her IV dilaudid at 16:09. Pt reports too painful for her to even try to move and just wants "it taken out". Will continue to follow for appropriateness of eval.  [name], OTR/L Acute Rehab Services Pager [phone] Office [phone]     Almon Register 03/12/2020, 4:41 PM

## 2020-03-12 NOTE — ED Notes (Incomplete)
Pt c/o rectal and abd. Pain that started last night . Pt states she has rectal cancer and is being treated here. Pt was given 50 mcg of fentanyl  By ems and ft has a fentanyl patch on her right shoulder that was placed yesterday.

## 2020-03-12 NOTE — Evaluation (Signed)
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  After discussion with surgery and oncology, patient opted for conservative management, wanting to avoid a palliative diverting colostomy.  She underwent an interventional radiology CT-guided nerve block and palliative care assisted for pain management.  It was recommended she go to a skilled nursing facility, but patient declined this and she was discharged yesterday, 10/24 on fentanyl patch, oral Dilaudid, Neurontin, tapering Decadron and local anesthetics. After discharged home, patient continued to have episodes of pain which were intractable and she came back into the ED  morning of 10/25, complaining of persistent intractable rectal pain.  Clinical Impression  Pt admitted with above diagnosis.  Pt  Agreeable to sit EOB, able to stand with min/guard assist however further mobility limited by pain.  Recommend SNF, pt reluctant agreeable at this time. Given pt's repeated admissions and decr caregiver support she will likely need a higher level of care at d/c. Will follow in acute setting.   Pt currently with functional limitations due to the deficits listed below (see PT Problem List). Pt will benefit from skilled PT to increase their independence and safety with mobility to allow discharge to the venue listed below.       Follow Up Recommendations SNF    Equipment Recommendations  None recommended by PT    Recommendations for Other Services       Precautions / Restrictions Precautions Precautions: Fall Precaution Comments: oozes stool, very painful rectum Restrictions Weight Bearing  Restrictions: No      Mobility  Bed Mobility Overal bed mobility: Needs Assistance Bed Mobility: Rolling;Sidelying to Sit;Sit to Sidelying Rolling: Supervision Sidelying to sit: Min guard     Sit to sidelying: Min guard General bed mobility comments: increased to perform due to pain. min guard for safety    Transfers Overall transfer level: Needs assistance Equipment used: None Transfers: Sit to/from Stand Sit to Stand: Min guard         General transfer comment: min/guard for safety. incr time. limited by pain  Ambulation/Gait                Stairs            Wheelchair Mobility    Modified Rankin (Stroke Patients Only)       Balance   Sitting-balance support: Bilateral upper extremity supported Sitting balance-Leahy Scale: Good     Standing balance support: During functional activity;No upper extremity supported (uses UEs d/t pain, not needed for balance) Standing balance-Leahy Scale: Fair                               Pertinent Vitals/Pain Pain Assessment: 0-10 Pain Score: 10-Worst pain ever Pain Location: rectum and stomach Pain Descriptors / Indicators: Jabbing;Moaning;Grimacing;Guarding Pain Intervention(s): Limited activity within patient's tolerance;Monitored during session;Repositioned;Premedicated before session    Home Living Family/patient expects to be discharged to:: Unsure Living Arrangements: Alone Available Help at Discharge: Family;Available PRN/intermittently Type of Home: Apartment Home Access: Stairs to enter   Entrance Stairs-Number of Steps: 4 Home Layout: Two level;Bed/bath upstairs Home Equipment: Walker - 2 wheels;Shower seat      Prior  Function Level of Independence: Independent         Comments: worked as a Education officer, museum Extremity Assessment Upper Extremity Assessment: Defer to OT evaluation    Lower Extremity Assessment Lower  Extremity Assessment: Overall WFL for tasks assessed       Communication   Communication: No difficulties  Cognition Arousal/Alertness: Awake/alert Behavior During Therapy: WFL for tasks assessed/performed;Flat affect Overall Cognitive Status: Within Functional Limits for tasks assessed                                        General Comments      Exercises     Assessment/Plan    PT Assessment Patient needs continued PT services  PT Problem List Decreased activity tolerance;Decreased mobility;Pain;Decreased knowledge of use of DME       PT Treatment Interventions DME instruction;Gait training;Functional mobility training;Therapeutic activities;Therapeutic exercise;Patient/family education    PT Goals (Current goals can be found in the Care Plan section)  Acute Rehab PT Goals Patient Stated Goal: pt states she is unsure PT Goal Formulation: With patient Time For Goal Achievement: 03/26/20 Potential to Achieve Goals: Fair    Frequency Min 2X/week   Barriers to discharge Decreased caregiver support;Inaccessible home environment      Co-evaluation               AM-PAC PT "6 Clicks" Mobility  Outcome Measure Help needed turning from your back to your side while in a flat bed without using bedrails?: A Little Help needed moving from lying on your back to sitting on the side of a flat bed without using bedrails?: A Little Help needed moving to and from a bed to a chair (including a wheelchair)?: A Little Help needed standing up from a chair using your arms (e.g., wheelchair or bedside chair)?: A Little Help needed to walk in hospital room?: A Lot Help needed climbing 3-5 steps with a railing? : Total 6 Click Score: 15    End of Session   Activity Tolerance: Patient limited by pain Patient left: in bed;with call bell/phone within reach;with bed alarm set   PT Visit Diagnosis: Difficulty in walking, not elsewhere classified (R26.2);Pain;Other  abnormalities of gait and mobility (R26.89)    Time: 0258-5277 PT Time Calculation (min) (ACUTE ONLY): 13 min   Charges:   PT Evaluation $PT Eval Low Complexity: 1 Low          Jacky Dross, PT  Acute Rehab Dept ([facility]) [phone] Pager [phone]  03/12/2020   [facility] ([facility]) 03/12/2020, 1:14 PM

## 2020-03-12 NOTE — NC FL2 (Signed)
[place] LEVEL OF CARE SCREENING TOOL     IDENTIFICATION  Patient Name: Vanessa Beard Birthdate: Nov 07, 1950 Sex: female Admission Date (Current Location): 03/12/2020  [facility] and [place] Number:  Herbalist and Address:  [facility],  [place], [place]      Provider Number: [id number]  Attending Physician Name and Address:  Annita Brod, MD  Relative Name and Phone Number:       Current Level of Care: Hospital Recommended Level of Care: [place] Prior Approval Number:    Date Approved/Denied:   PASRR Number: [id number]  Discharge Plan: SNF    Current Diagnoses: Patient Active Problem List   Diagnosis Date Noted  . Protein-calorie malnutrition, severe 03/05/2020  . Palliative care encounter 01/27/2020  . Goals of care, counseling/discussion 01/16/2020  . Hemorrhoid 12/16/2019  . Rectal pain 12/16/2019  . Anal cancer ([place]) 07/26/2019  . Rectal mass 07/14/2019  . Constipation 07/14/2019  . Anal fissure 07/14/2019  . Healthcare maintenance 06/30/2018  . Left knee pain 12/30/2017  . Left shoulder pain 12/30/2017  . Closed fracture of right tibial plateau 02/18/2017  . Benign hypertension 02/15/2017  . Screening examination for venereal disease 03/06/2015  . Encounter for long-term (current) use of medications 03/06/2015  . Elevated LFTs 12/11/2014  . Abnormal ultrasound 12/11/2014  . Abdominal pain, epigastric 12/11/2014  . ASCUS of cervix with negative high risk HPV 10/31/2013  . Gall bladder disease 02/03/2013  . 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  . Compression fracture of L3 lumbar vertebra 07/01/2011  . Thyroid dysfunction 07/01/2011  . Anemia 07/01/2011  . Sore throat 04/02/2011  . Hyperparathyroidism 02/10/2011  . Osteopenia 02/03/2011  . PAP SMEAR, LGSIL, ABNORMAL 12/21/2009  . COUGH 07/18/2009  . Chronic hepatitis C virus infection ([place])  12/19/2008  . LEUKORRHEA 04/07/2008  . FATIGUE 04/07/2008  . BACK PAIN, LUMBAR 11/10/2007  . ALLERGIC RHINITIS 07/09/2007  . PNEUMOCYSTIS PNEUMONIA 05/07/2007  . Essential hypertension 05/07/2007  . DENTAL CARIES 05/07/2007  . INSOMNIA 05/07/2007  . PNEUMONIA, HX OF 05/07/2007  . HERPES ZOSTER, HX OF 05/07/2007  . Human immunodeficiency virus (HIV) disease ([place]) 08/04/2006    Orientation RESPIRATION BLADDER Height & Weight     Self, Time, Situation, Place  Normal Incontinent Weight: 58.5 kg Height:  5' (152.4 cm)  BEHAVIORAL SYMPTOMS/MOOD NEUROLOGICAL BOWEL NUTRITION STATUS      Incontinent Diet  AMBULATORY STATUS COMMUNICATION OF NEEDS Skin   Limited Assist Verbally Normal                       Personal Care Assistance Level of Assistance  Bathing, Dressing Bathing Assistance: Limited assistance   Dressing Assistance: Limited assistance     Functional Limitations Info             SPECIAL CARE FACTORS FREQUENCY  PT (By licensed PT), OT (By licensed OT)     PT Frequency: 5 x weekly OT Frequency: 5 x weekly            Contractures Contractures Info: Not present    Additional Factors Info  Code Status, Allergies Code Status Info: DNR Allergies Info: Dapsone, Retrovir, Sulfamethoxazole-trimethoprim           Current Medications (03/12/2020):  This is the current hospital active medication list Current Facility-Administered Medications  Medication Dose Route Frequency Provider Last Rate Last Admin  . abacavir-dolutegravir-lamiVUDine (TRIUMEQ) 600-50-300 MG per tablet 1 tablet  1 tablet Oral Daily Annita Brod, MD      . belladonna-opium (B&O) suppository 16.2-30 mg  1 suppository Rectal Q6H PRN Annita Brod, MD      . butamben-tetracaine-benzocaine (CETACAINE) spray 1 spray  1 spray Topical TID PRN Ripley Fraise, MD      . Derrill Memo ON 03/15/2020] dexamethasone (DECADRON) tablet 4 mg  4 mg Oral Daily Annita Brod, MD       Followed  by  . [START ON 03/18/2020] dexamethasone (DECADRON) tablet 2 mg  2 mg Oral Daily Annita Brod, MD      . dexamethasone (DECADRON) tablet 6 mg  6 mg Oral Daily Annita Brod, MD      . DULoxetine (CYMBALTA) DR capsule 20 mg  20 mg Oral Daily Annita Brod, MD      . enoxaparin (LOVENOX) injection 40 mg  40 mg Subcutaneous Daily Annita Brod, MD      . Derrill Memo ON 03/13/2020] fentaNYL (DURAGESIC) 50 MCG/HR 1 patch  1 patch Transdermal Q72H Annita Brod, MD      . gabapentin (NEURONTIN) capsule 600 mg  600 mg Oral BID Annita Brod, MD      . HYDROmorphone (DILAUDID) injection 1 mg  1 mg Intravenous Q2H PRN Annita Brod, MD   1 mg at 03/12/20 1002  . HYDROmorphone (DILAUDID) tablet 2 mg  2 mg Oral Q6H PRN Annita Brod, MD      . lactose free nutrition (BOOST PLUS) liquid 237 mL  237 mL Oral TID WC Annita Brod, MD      . polyethylene glycol (MIRALAX / GLYCOLAX) packet 17 g  17 g Oral Daily Gevena Barre K, MD      . potassium chloride SA (KLOR-CON) CR tablet 20 mEq  20 mEq Oral Daily Annita Brod, MD      . senna-docusate (Senokot-S) tablet 1 tablet  1 tablet Oral BID Annita Brod, MD      . sucralfate (CARAFATE) tablet 1 g  1 g Oral BID Annita Brod, MD      . zolpidem (AMBIEN) tablet 5 mg  5 mg Oral QHS PRN Annita Brod, MD         Discharge Medications: Please see discharge summary for a list of discharge medications.  Relevant Imaging Results:  Relevant Lab Results:   Additional Information ss# [id number]  large visible anal mass that is ulcerated and friable  Nazim Kadlec, Marjie Skiff, RN

## 2020-03-12 NOTE — Progress Notes (Deleted)
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  After discussion with surgery and oncology, patient opted for conservative management, wanting to avoid a palliative diverting colostomy.  She underwent an interventional radiology CT-guided nerve block and palliative care assisted for pain management.  It was recommended she go to a skilled nursing facility, but patient declined this and she was discharged yesterday, 10/24 on fentanyl patch, oral Dilaudid, Neurontin, tapering Decadron and local anesthetics.  After discharged home, patient continued to have episodes of pain which were intractable and she came back into the emergency room on the early morning of 10/25, complaining of persistent intractable rectal pain.  Patient was given IV Dilaudid and local anesthetics and she admitted that she did not think she was going to be able to care for herself at home.  She confirms that she still does not want surgery, but is willing to consider skilled nursing.  Hospitalist were called for further evaluation.  Assessment/Plan: Principal Problem:   Rectal pain: Intractable, requiring IV pain medications.  Have restarted her regimen as per palliative care including steroids, IV Dilaudid as needed, Duragesic patch, local anesthetics and Neurontin. Active Problems:   Human immunodeficiency virus (HIV) disease ([place]): CD4 count last checked at 76 during previous hospitalization.  Not currently on pneumocystis pneumonia prophylaxis due to intolerance of Bactrim and dapsone.  Patient follows with infectious disease.    Chronic hepatitis C virus infection:  Status post treatment in 2018.  Essential hypertension: During previous hospitalization, with starting of pain control, patient's blood pressures became soft and blood pressure medications were discontinued.    Anal cancer [facility]): On Keytruda.  Will notify oncology of patient's return.    Protein-calorie malnutrition, severe: Reviewed consult from nutrition from few days ago.  Patient on regular diet plus boost 3 times daily   Code Status: DNR, will notify palliative care of patient's hospitalization  Family Communication: Will update patient's daughter by phone  Disposition Plan: Anticipate discharge when social work confined skilled nursing   Consultants:  Not at this time, but will notify oncology and palliative care patients rehospitalization  Procedures:  None, recent CT-guided nerve block  Antimicrobials:  None  DVT prophylaxis: Lovenox   Objective: Vitals:   03/12/20 0830 03/12/20 0849  BP: 121/79 121/79  Pulse: 65 62  Resp: 17 11  Temp:    SpO2: 97% 96%   No intake or output data in the 24 hours ending 03/12/20 0920 Filed Weights   03/12/20 0550  Weight: 58.5 kg   Body mass index is 25.19 kg/m.  Exam:   General: Alert and oriented x3, mild distress, fatigued  HEENT: Normocephalic and atraumatic, mucous membranes slightly dry  Neck: Supple, no  Cardiovascular: Regular rate and rhythm, S1-S2  Respiratory: Clear to auscultation bilaterally  Abdomen: Soft, nontender, nondistended, hypoactive bowel sounds  Musculoskeletal: No clubbing or cyanosis or edema  Skin: No skin breaks, tears or lesions  Psychiatry: Appropriate, no evidence of psychosis   Data Reviewed: CBC: Recent Labs  Lab 03/08/20 0520 03/12/20 0626  WBC 9.1 6.8  NEUTROABS 8.1* 5.8  HGB 9.5* 9.1*  HCT 29.3* 28.6*  MCV 97.0 100.0  PLT 199 144*  Basic Metabolic Panel: Recent Labs  Lab 03/12/20 0626  NA 139  K 3.7  CL 102  CO2 28  GLUCOSE 102*  BUN 20  CREATININE  0.61  CALCIUM 9.7   GFR: Estimated Creatinine Clearance: 51.6 mL/min (by C-G formula based on SCr of 0.61 mg/dL). Liver Function Tests: No results for input(s): AST, ALT, ALKPHOS, BILITOT, PROT, ALBUMIN in the last 168 hours. No results for input(s): LIPASE, AMYLASE in the last 168 hours. No results for input(s): AMMONIA in the last 168 hours. Coagulation Profile: No results for input(s): INR, PROTIME in the last 168 hours. Cardiac Enzymes: No results for input(s): CKTOTAL, CKMB, CKMBINDEX, TROPONINI in the last 168 hours. BNP (last 3 results) No results for input(s): PROBNP in the last 8760 hours. HbA1C: No results for input(s): HGBA1C in the last 72 hours. CBG: No results for input(s): GLUCAP in the last 168 hours. Lipid Profile: No results for input(s): CHOL, HDL, LDLCALC, TRIG, CHOLHDL, LDLDIRECT in the last 72 hours. Thyroid Function Tests: No results for input(s): TSH, T4TOTAL, FREET4, T3FREE, THYROIDAB in the last 72 hours. Anemia Panel: No results for input(s): VITAMINB12, FOLATE, FERRITIN, TIBC, IRON, RETICCTPCT in the last 72 hours. Urine analysis:    Component Value Date/Time   COLORURINE AMBER (A) 03/03/2020 1628   APPEARANCEUR HAZY (A) 03/03/2020 1628   LABSPEC 1.018 03/03/2020 1628   PHURINE 6.0 03/03/2020 1628   GLUCOSEU NEGATIVE 03/03/2020 1628   HGBUR MODERATE (A) 03/03/2020 1628   BILIRUBINUR NEGATIVE 03/03/2020 [place] 03/03/2020 1628   PROTEINUR 30 (A) 03/03/2020 1628   UROBILINOGEN >8.0 (H) 11/03/2014 2000   NITRITE NEGATIVE 03/03/2020 1628   LEUKOCYTESUR MODERATE (A) 03/03/2020 1628   Sepsis Labs: @LABRCNTIP (procalcitonin:4,lacticidven:4)  ) Recent Results (from the past 240 hour(s))  Culture, blood (routine x 2)     Status: None   Collection Time: 03/03/20  4:29 PM   Specimen: BLOOD  Result Value Ref Range Status   Specimen Description   Final    BLOOD RIGHT ANTECUBITAL Performed at [facility], [place]  [address], [place], [place] [address]    Special Requests   Final    BOTTLES DRAWN AEROBIC AND ANAEROBIC Blood Culture adequate volume Performed at [place] [address] [place], [place] [address]    Culture   Final    NO GROWTH 5 DAYS Performed at [facility], [place] [address], [place], [place] [address]    Report Status 03/09/2020 FINAL  Final  Respiratory Panel by RT PCR (Flu A&B, Covid) - Nasopharyngeal Swab     Status: None   Collection Time: 03/03/20  6:09 PM   Specimen: Nasopharyngeal Swab  Result Value Ref Range Status   SARS Coronavirus 2 by RT PCR NEGATIVE NEGATIVE Final    Comment: (NOTE) SARS-CoV-2 target nucleic acids are NOT DETECTED.  The SARS-CoV-2 RNA is generally detectable in upper respiratoy specimens during the acute phase of infection. The lowest concentration of SARS-CoV-2 viral copies this assay can detect is 131 copies/mL. A negative result does not preclude SARS-Cov-2 infection and should not be used as the sole basis for treatment or other patient management decisions. A negative result may occur with  improper specimen collection/handling, submission of specimen other than nasopharyngeal swab, presence of viral mutation(s) within the areas targeted by this assay, and inadequate number of viral copies (<131 copies/mL). A negative result must be combined with clinical observations, patient history, and epidemiological information. The expected result is  Negative.  Fact Sheet for Patients:  PinkCheek.be  Fact Sheet for Healthcare Providers:  GravelBags.it  This test is no t yet approved or cleared by the [place] FDA and  has been authorized for detection and/or diagnosis of SARS-CoV-2 by FDA under an Emergency Use Authorization (EUA). This EUA will remain  in effect (meaning this test can be used) for the duration of the COVID-19 declaration under Section  564(b)(1) of the Act, 21 U.S.C. section 360bbb-3(b)(1), unless the authorization is terminated or revoked sooner.     Influenza A by PCR NEGATIVE NEGATIVE Final   Influenza B by PCR NEGATIVE NEGATIVE Final    Comment: (NOTE) The Xpert Xpress SARS-CoV-2/FLU/RSV assay is intended as an aid in  the diagnosis of influenza from Nasopharyngeal swab specimens and  should not be used as a sole basis for treatment. Nasal washings and  aspirates are unacceptable for Xpert Xpress SARS-CoV-2/FLU/RSV  testing.  Fact Sheet for Patients: PinkCheek.be  Fact Sheet for Healthcare Providers: GravelBags.it  This test is not yet approved or cleared by the [place] FDA and  has been authorized for detection and/or diagnosis of SARS-CoV-2 by  FDA under an Emergency Use Authorization (EUA). This EUA will remain  in effect (meaning this test can be used) for the duration of the  Covid-19 declaration under Section 564(b)(1) of the Act, 21  U.S.C. section 360bbb-3(b)(1), unless the authorization is  terminated or revoked. Performed at [facility], [place] [address], [place], [place] [address]   Surgical PCR screen     Status: None   Collection Time: 03/08/20 10:14 PM   Specimen: Nasal Mucosa; Nasal Swab  Result Value Ref Range Status   MRSA, PCR NEGATIVE NEGATIVE Final   Staphylococcus aureus NEGATIVE NEGATIVE Final    Comment: (NOTE) The Xpert SA Assay (FDA approved for NASAL specimens in patients 82 years of age and older), is one component of a comprehensive surveillance program. It is not intended to diagnose infection nor to guide or monitor treatment. Performed at [facility], [place] [address] [place], [place] [address]       Studies: No results found.  Scheduled Meds: . abacavir-dolutegravir-lamiVUDine  1 tablet Oral Daily  . dexamethasone  6 mg Oral Daily  . DULoxetine  20 mg Oral Daily   . [START ON 03/14/2020] fentaNYL  1 patch Transdermal Q72H  . gabapentin  600 mg Oral BID  . polyethylene glycol  17 g Oral Daily  . potassium chloride SA  20 mEq Oral Daily  . senna-docusate  1 tablet Oral BID  . sucralfate  1 g Oral BID    Continuous Infusions:   LOS: 0 days     Annita Brod, MD [facility]   03/12/2020, 9:20 AM

## 2020-03-13 ENCOUNTER — Encounter (HOSPITAL_COMMUNITY): Payer: Self-pay | Admitting: Internal Medicine

## 2020-03-13 ENCOUNTER — Inpatient Hospital Stay (HOSPITAL_COMMUNITY): Payer: Medicare HMO

## 2020-03-13 DIAGNOSIS — Z515 Encounter for palliative care: Secondary | ICD-10-CM | POA: Diagnosis not present

## 2020-03-13 DIAGNOSIS — R52 Pain, unspecified: Secondary | ICD-10-CM | POA: Diagnosis not present

## 2020-03-13 DIAGNOSIS — K6289 Other specified diseases of anus and rectum: Secondary | ICD-10-CM | POA: Diagnosis not present

## 2020-03-13 DIAGNOSIS — Z7189 Other specified counseling: Secondary | ICD-10-CM | POA: Diagnosis not present

## 2020-03-13 MED ORDER — IOHEXOL 9 MG/ML PO SOLN
500.0000 mL | ORAL | Status: AC
  Administered 2020-03-13: 500 mL via ORAL

## 2020-03-13 MED ORDER — IOHEXOL 300 MG/ML  SOLN
100.0000 mL | Freq: Once | INTRAMUSCULAR | Status: AC | PRN
  Administered 2020-03-13: 100 mL via INTRAVENOUS

## 2020-03-13 MED ORDER — ZINC OXIDE 40 % EX OINT
TOPICAL_OINTMENT | Freq: Two times a day (BID) | CUTANEOUS | Status: DC
  Administered 2020-03-13 – 2020-03-26 (×8): 1 via TOPICAL
  Filled 2020-03-13 (×4): qty 57

## 2020-03-13 MED ORDER — IOHEXOL 9 MG/ML PO SOLN
ORAL | Status: AC
  Administered 2020-03-13: 500 mL via ORAL
  Filled 2020-03-13: qty 1000

## 2020-03-13 NOTE — Consult Note (Addendum)
WOC consult requested for rectal cancer.  Pt reports she has "anal pain and rectal bleeding, she was found to have necrotic anorectal mass; according to the EMR."  There is no role for WOC nursing, since topical treatment will be minimally effective.  Pt can use Desitin to protect the location, and recommend primary team order viscous lidocaine to help with the localized pain at the site.  Secure chat sent to request them to order this medication if desired.  Please re-consult if further assistance is needed.  Thank-you,  Julien Girt MSN, [place], [place], [place], [place]

## 2020-03-13 NOTE — Consult Note (Signed)
Consultation Note Date: 03/13/2020   Patient Name: Vanessa Beard  DOB: 1950/06/06  MRN: [id number]  Age / Sex: 69 y.o., female  PCP: Seward Carol, MD Referring Physician: Shelly Coss, MD  Reason for Consultation: Establishing goals of care  HPI/Patient Profile: 69 y.o. female  admitted on 03/12/2020   Patient is a 69 year old female with history of hypertension, HIV, hepatitis C status post treatment in 2018, anal squamous cell carcinoma currently on Keytruda who presented with severe anal pain.She was just D/ced from her on 10.24.21 On her last admission for anal pain and rectal bleeding, she was found to have necrotic anorectal mass.  General surgery and oncology were following.  After discussion, patient wanted to go for conservative management/wants to avoid surgery and wants to continue chemotherapy as an outpatient.  Palliative care consulted for pain management and IR did CT-guided nerve block.  Hospital course remarkable for significant pain requiring IV pain medications and prolonged hospital stay. She was given option for skilled nursing facility discharge but she wanted to go home with home health.  Now she is considering skilled nursing facility placement.  Admitted this time for severe anal pain.  Oncology following. Palliative re consulted for symptom management.   Clinical Assessment and Goals of Care: Patient is awake alert resting in bed, her sister is at the bedside. I introduced myself and palliative care as follows:  Palliative medicine is specialized medical care for people living with serious illness. It focuses on providing relief from the symptoms and stress of a serious illness. The goal is to improve quality of life for both the patient and the family.  Goals of care: Broad aims of medical therapy in relation to the patient's values and preferences. Our aim is to provide medical  care aimed at enabling patients to achieve the goals that matter most to them, given the circumstances of their particular medical situation and their constraints.    Patient states that her pain is better controlled now, she states that she passed a hard bowel movement that exacerbated her pain. We talked about continuing bowel regimen, she is on Miralax and also Senokot PO. Patient asks whether a wound care RN can come and assess her.   NEXT OF KIN  sister.   SUMMARY OF RECOMMENDATIONS    1. Continue current pain and bowel regimen. Patient on trans dermal Fentanyl 50 mcg patch, IV Dilaudid and also PO Dilaudid PRN. Also on Adjuvants.  2. Wound care consult as per patient's wishes.   Code Status/Advance Care Planning:  DNR    Symptom Management:    as above.   Palliative Prophylaxis:   Delirium Protocol   Psycho-social/Spiritual:   Desire for further Chaplaincy support:yes  Additional Recommendations: Caregiving  Support/Resources  Prognosis:   Unable to determine  Discharge Planning: To Be Determined      Primary Diagnoses: Present on Admission: . Rectal pain . Human immunodeficiency virus (HIV) disease ([place]) . Chronic hepatitis C virus infection ([place]) . Essential hypertension . Anal cancer ([place]) .  Protein-calorie malnutrition, severe   I have reviewed the medical record, interviewed the patient and family, and examined the patient. The following aspects are pertinent.  Past Medical History:  Diagnosis Date  . Allergic rhinitis   . Biliary colic   . Cancer ([place])   . Gall bladder disease \  . Hepatitis C   . Herpes   . HIV infection ([place])   . Hyperparathyroidism ([place])   . Hypertension   . Insomnia   . Osteoporosis   . Pneumonia    2010   Social History   Socioeconomic History  . Marital status: Divorced    Spouse name: Not on file  . Number of children: 2  . Years of education: Not on file  . Highest education level: Not on file   Occupational History  . Occupation: CNA  Tobacco Use  . Smoking status: Never Smoker  . Smokeless tobacco: Never Used  Vaping Use  . Vaping Use: Never used  Substance and Sexual Activity  . Alcohol use: Not Currently  . Drug use: No  . Sexual activity: Yes    Partners: Male    Birth control/protection: Condom    Comment: declined condoms  Other Topics Concern  . Not on file  Social History Narrative  . Not on file   Social Determinants of Health   Financial Resource Strain:   . Difficulty of Paying Living Expenses: Not on file  Food Insecurity:   . Worried About Charity fundraiser in the Last Year: Not on file  . Ran Out of Food in the Last Year: Not on file  Transportation Needs: Unmet Transportation Needs  . Lack of Transportation (Medical): Yes  . Lack of Transportation (Non-Medical): Yes  Physical Activity:   . Days of Exercise per Week: Not on file  . Minutes of Exercise per Session: Not on file  Stress:   . Feeling of Stress : Not on file  Social Connections:   . Frequency of Communication with Friends and Family: Not on file  . Frequency of Social Gatherings with Friends and Family: Not on file  . Attends Religious Services: Not on file  . Active Member of Clubs or Organizations: Not on file  . Attends Archivist Meetings: Not on file  . Marital Status: Not on file   Family History  Problem Relation Age of Onset  . Diabetes Mother   . Hypertension Mother   . Bone cancer Half-Brother   . Prostate cancer Half-Brother   . Colon cancer Neg Hx   . Colon polyps Neg Hx   . Esophageal cancer Neg Hx   . Pancreatic cancer Neg Hx   . Stomach cancer Neg Hx   . Liver disease Neg Hx    Scheduled Meds: . abacavir-dolutegravir-lamiVUDine  1 tablet Oral Daily  . [START ON 03/15/2020] dexamethasone  4 mg Oral Daily   Followed by  . [START ON 03/18/2020] dexamethasone  2 mg Oral Daily  . dexamethasone  6 mg Oral Daily  . DULoxetine  20 mg Oral Daily  .  enoxaparin (LOVENOX) injection  40 mg Subcutaneous Daily  . fentaNYL  1 patch Transdermal Q72H  . gabapentin  600 mg Oral BID  . iohexol  500 mL Oral Q1H  . lactose free nutrition  237 mL Oral TID WC  . polyethylene glycol  17 g Oral Daily  . potassium chloride SA  20 mEq Oral Daily  . senna-docusate  1 tablet Oral BID  .  sucralfate  1 g Oral BID   Continuous Infusions: PRN Meds:.belladonna-opium, butamben-tetracaine-benzocaine, HYDROmorphone (DILAUDID) injection, HYDROmorphone, zolpidem Medications Prior to Admission:  Prior to Admission medications   Medication Sig Start Date End Date Taking? Authorizing Provider  belladonna-opium (B&O SUPPRETTES) 16.2-30 MG suppository Place 1 suppository rectally every 6 (six) hours as needed for pain. 03/11/20   Shelly Coss, MD  butamben-tetracaine-benzocaine (CETACAINE) 06-20-12 % spray Apply 1 spray topically as needed (for Bowel movement pain or perineal pain). 03/09/20   Shelly Coss, MD  dexamethasone (DECADRON) 2 MG tablet Take 1 tablet (2 mg total) by mouth daily. Take 3 pills daily  for 3 days then 2 pills daily for 3 days then 1 pill daily for 3 days then stop 03/12/20   Shelly Coss, MD  DULoxetine (CYMBALTA) 20 MG capsule Take 1 capsule (20 mg total) by mouth daily. 12/16/19   Heilingoetter, Cassandra L, PA-C  fentaNYL (DURAGESIC) 50 MCG/HR Place 1 patch onto the skin every 3 (three) days. 03/12/20   Shelly Coss, MD  gabapentin (NEURONTIN) 300 MG capsule Take 2 capsules (600 mg total) by mouth 2 (two) times daily. 03/11/20   Shelly Coss, MD  HYDROmorphone (DILAUDID) 2 MG tablet Take 1 tablet (2 mg total) by mouth every 6 (six) hours as needed for severe pain. 03/09/20   Shelly Coss, MD  polyethylene glycol (MIRALAX / GLYCOLAX) 17 g packet Take 17 g by mouth daily. 03/12/20   Shelly Coss, MD  potassium chloride SA (KLOR-CON) 20 MEQ tablet Take 1 tablet (20 mEq total) by mouth daily. 02/17/20   Heilingoetter, Cassandra L,  PA-C  senna-docusate (SENOKOT-S) 8.6-50 MG tablet Take 1 tablet by mouth 2 (two) times daily. 03/11/20   Shelly Coss, MD  sucralfate (CARAFATE) 1 g tablet TAKE 2 TABLETS AND MIX IN 4.5ML OF WATER AND PLACE IN ENEMA APPLICATOR USE TWICE DAILY Patient taking differently: Take 2 g by mouth daily.  02/13/20   Heilingoetter, Cassandra L, PA-C  TRIUMEQ 600-50-300 MG tablet TAKE 1 TABLET BY MOUTH DAILY Patient taking differently: Take 1 tablet by mouth daily.  12/08/19   Carlyle Basques, MD  zolpidem (AMBIEN) 10 MG tablet Take 0.5 tablets (5 mg total) by mouth at bedtime as needed. 03/11/20   Shelly Coss, MD  prochlorperazine (COMPAZINE) 10 MG tablet Take 1 tablet (10 mg total) by mouth every 6 (six) hours as needed (Nausea or vomiting). Patient not taking: Reported on 11/08/2019 07/26/19 01/16/20  Truitt Merle, MD   Allergies  Allergen Reactions  . Dapsone Nausea Only    stomach  burning  . Retrovir [Zidovudine] Other (See Comments)    Changed skin color  . Sulfamethoxazole-Trimethoprim Other (See Comments)    Flu like symptoms   Review of Systems +pain Physical Exam Appears weak, chronically ill Regular work of breathing S 1 S 2  Abdomen not distended No edema Non focal  Vital Signs: BP 117/85   Pulse 76   Temp 97.9 F (36.6 C) (Oral)   Resp 15   Ht 5' (1.524 m)   Wt 58.5 kg   SpO2 94%   BMI 25.19 kg/m  Pain Scale: 0-10   Pain Score: 5    SpO2: SpO2: 94 % O2 Device:SpO2: 94 % O2 Flow Rate: .   IO: Intake/output summary:   Intake/Output Summary (Last 24 hours) at 03/13/2020 1251 Last data filed at 03/12/2020 1414 Gross per 24 hour  Intake 240 ml  Output --  Net 240 ml    LBM: Last BM Date:  03/13/20 Baseline Weight: Weight: 58.5 kg Most recent weight: Weight: 58.5 kg     Palliative Assessment/Data:   PPS 40%  Time In:  10 Time Out:  11 Time Total:   60  Greater than 50%  of this time was spent counseling and coordinating care related to the above  assessment and plan.  Signed by: Loistine Chance, MD   Please contact Palliative Medicine Team phone at [phone] for questions and concerns.  For individual provider: See Shea Evans

## 2020-03-13 NOTE — Evaluation (Signed)
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  After discussion with surgery and oncology, patient opted for conservative management, wanting to avoid a palliative diverting colostomy.  She underwent an interventional radiology CT-guided nerve block and palliative care assisted for pain management.  It was recommended she go to a skilled nursing facility, but patient declined this and she was discharged yesterday, 10/24 on fentanyl patch, oral Dilaudid, Neurontin, tapering Decadron and local anesthetics. After discharged home, patient continued to have episodes of pain which were intractable and she came back into the ED  morning of 10/25, complaining of persistent intractable rectal pain.   Clinical Impression   Patient is currently requiring assistance with ADLs including min guard assist with bathing, dressing and toileting as well as standing grooming at sink, all of which is below patient's typical baseline of being Independent prior to October.  During this evaluation, patient was limited by rectal pain and impulsivity with 1 loss of balance while sitting in the chair, and need of assistance to prevent fall. Current limitations have the potential to impact patient's safety and independence during functional mobility, as well as performance for ADLs. [place] "6-clicks" Daily Activity Inpatient Short Form score of 19/24 indicates 42.80% ADL impairment this session. Patient lives alone, with a friend who is able to provide some supervision and assistance about 3 times/week.  Patient demonstrates good rehab potential, and should benefit  from continued skilled occupational therapy services while in acute care to maximize safety, independence and quality of life at home.  Continued occupational therapy services in a SNF setting prior to return home is recommended.  ?   Follow Up Recommendations    SNF   Equipment Recommendations    None   Recommendations for Other Services       Precautions / Restrictions Precautions Precautions: Fall Precaution Comments: oozes stool, very painful rectum Restrictions Weight Bearing Restrictions: No      Mobility Bed Mobility   Bed Mobility: Sit to Supine       Sit to supine: Modified independent (Device/Increase time);HOB elevated        Transfers Overall transfer level: Needs assistance Equipment used: None Transfers: Sit to/from [facility] Sit to Stand: Min guard Stand pivot transfers: Min guard            Balance Overall balance assessment: Needs assistance Sitting-balance support: No upper extremity supported Sitting balance-Leahy Scale: Poor Sitting balance - Comments: LOB sitting in chair while atetmpting to reach feet to bathe.   Standing balance support: Single extremity supported Standing balance-Leahy Scale: Fair                             ADL either performed or assessed with clinical judgement   ADL Overall ADL's : Needs assistance/impaired Eating/Feeding: Independent   Grooming: Wash/dry hands;Wash/dry face;Standing;Min guard;Cueing for safety Grooming Details (indicate cue type and reason): Standing at sink, often resting Bil forearms on vanity for rest breaks. Upper Body Bathing: Sitting;Set up;Supervision/ safety;Cueing for safety   Lower Body Bathing: Min guard;Sit to/from stand;Sitting/lateral leans;Cueing for safety Lower Body Bathing Details (indicate cue type and reason): Verbal cues  for safety. Pt weashed upper LEs and peri area in standing. Transferred to recliner to wash feet with a loss of balance in  sitting while leaning far forward. Pt cued to use figure 4 technique however pt did not initiate this. Required Min guard for safety with far anterior leaning.     Lower Body Dressing: Min guard;Sitting/lateral leans;Sit to/from stand   Toilet Transfer: Min guard;Ambulation;BSC Toilet Transfer Details (indicate cue type and reason): Pt refusing a RW and furniture cruising with very forward/stooped posture. Toileting- Water quality scientist and Hygiene: Min guard;Sit to/from stand Toileting - Clothing Manipulation Details (indicate cue type and reason): Performed pericare x 2 while standing at sink. Min guard for safety. Pt with almost constant oozing of loose stool.     Functional mobility during ADLs: Min guard General ADL Comments: Refusing RW     Vision Baseline Vision/History: Wears glasses Wears Glasses: Reading only Vision Assessment?: No apparent visual deficits     Perception     Praxis      Pertinent Vitals/Pain Pain Assessment: 0-10 Pain Score: 8  Pain Location: rectum and stomach Pain Descriptors / Indicators: Guarding Pain Intervention(s): Premedicated before session;Monitored during session;Limited activity within patient's tolerance;Repositioned     Hand Dominance Right   Extremity/Trunk Assessment Upper Extremity Assessment Upper Extremity Assessment: Overall WFL for tasks assessed   Lower Extremity Assessment Lower Extremity Assessment: Overall WFL for tasks assessed   Cervical / Trunk Assessment Cervical / Trunk Assessment: Normal   Communication Communication Communication: No difficulties   Cognition Arousal/Alertness: Awake/alert Behavior During Therapy: WFL for tasks assessed/performed;Restless Overall Cognitive Status: Within Functional Limits for tasks assessed                                 General Comments: Impulsive with decreased awareness of safety.  Discovered standing at sink, washing peri areas without supervision.    General Comments       Exercises     Shoulder Instructions      Home Living Family/patient expects to be discharged to:: Skilled nursing facility Living Arrangements: Alone Available Help at Discharge: Family;Available PRN/intermittently Type of Home: Apartment Home Access: Stairs to enter Entrance Stairs-Number of Steps: 4   Home Layout: Two level;Bed/bath upstairs Alternate Level Stairs-Number of Steps: flight Alternate Level Stairs-Rails: Left Bathroom Shower/Tub: Teacher, early years/pre: Standard Bathroom Accessibility: Yes   Home Equipment: Walker - 2 wheels;Shower seat   Additional Comments: Pt prefers to stay upstairs.      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: Decreased activity tolerance;Decreased knowledge of use of DME or AE;Pain;Decreased strength      OT Treatment/Interventions: Self-care/ADL training;Therapeutic exercise;DME and/or AE instruction;Therapeutic activities;Patient/family education    OT Goals(Current goals can be found in the care plan section) Acute Rehab OT Goals Patient Stated Goal: Pt agreeable to SNF with goal of returning home after rehab OT Goal Formulation: With patient Time For Goal Achievement: 03/27/20 Potential to Achieve Goals: Good ADL Goals Pt Will Perform Grooming: with modified independence;standing Pt Will Perform Upper Body Bathing: with modified independence;sitting Pt Will Perform Lower Body Bathing: with modified independence;sitting/lateral leans;sit to/from stand;with adaptive equipment Pt Will Perform Lower Body Dressing: with adaptive equipment;sitting/lateral leans;sit to/from stand;with modified independence Pt Will Transfer to Toilet: with modified independence;ambulating Pt Will Perform Toileting - Clothing  Manipulation and hygiene: with modified independence;sit to/from stand;sitting/lateral leans Additional ADL  Goal #1: Pt will verbalize at least 3 methods of preventing falls at home in order to avoid injury and rehospitalization.  OT Frequency: Min 2X/week   Barriers to D/C:    Lives alone       Co-evaluation              AM-PAC OT "6 Clicks" Daily Activity     Outcome Measure Help from another person eating meals?: None Help from another person taking care of personal grooming?: A Little Help from another person toileting, which includes using toliet, bedpan, or urinal?: A Little Help from another person bathing (including washing, rinsing, drying)?: A Little Help from another person to put on and taking off regular upper body clothing?: A Little Help from another person to put on and taking off regular lower body clothing?: A Little 6 Click Score: 19   End of Session Equipment Utilized During Treatment: Gait belt Nurse Communication:  (Coordinated to premedicate for pain prior to OT.)  Activity Tolerance: Patient limited by pain Patient left: in bed;with call bell/phone within reach;with bed alarm set  OT Visit Diagnosis: Muscle weakness (generalized) (M62.81);Pain;History of falling (Z91.81);Repeated falls (R29.6) Pain - part of body:  (rectum)                Time: 2563-8937 OT Time Calculation (min): 22 min Charges:  OT General Charges $OT Visit: 1 Visit OT Evaluation $OT Eval Low Complexity: 1 Low  Drevion Offord, OT Acute Rehab Services Office: [phone] 03/13/2020   Julien Girt 03/13/2020, 11:27 AM

## 2020-03-13 NOTE — Progress Notes (Signed)
PROGRESS NOTE    Vanessa Beard  NOM:[id number] DOB: [birth year] DOA: 03/12/2020 PCP: Seward Carol, MD   Chief Complain: Rectal bleeding, pain  Brief Narrative:  Patient is a 69 year old female with history of hypertension, HIV, hepatitis C status post treatment in 2018, anal squamous cell carcinoma currently on Keytruda who presented with severe anal pain.She was just D/ced from her on 10.24.21 On her last admission for anal pain and rectal bleeding, she was found to have necrotic anorectal mass.  General surgery and oncology were following.  After discussion, patient wanted to go for conservative management/wants to avoid surgery and wants to continue chemotherapy as an outpatient.  Palliative care consulted for pain management and IR did CT-guided nerve block.  Hospital course remarkable for significant pain requiring IV pain medications and prolonged hospital stay. She was given option for skilled nursing facility discharge but she wanted to go home with home health.  Now she is considering skilled nursing facility placement.  Admitted this time for severe anal pain.  Oncology following.  Assessment & Plan:   Principal Problem:   Rectal pain Active Problems:   Human immunodeficiency virus (HIV) disease ([facility])   Chronic hepatitis C virus infection ([place])   Essential hypertension   Anal cancer ([place])   Protein-calorie malnutrition, severe  Anal squamous cell carcinoma with necrotic anorectal mass: She was following with oncology and was on Keytruda.  She presented with rectal pain, bleeding.  Found to have local progression of disease on last admission.  Continue pain management and supportive care. General surgery were also following on last admission.  General surgery considered  fecal diversion with sigmoid loop colostomy for palliative approach but patient didnt prefer surgery. Oncology is planning to continue chemotherapy as an outpatient. We also requested palliative care  evaluation for pain management. IR did CT-guided nerve block on 10.19.21. Goal is  to control her pain better. Continue bowel regimen to avoid constipation. Continue current pain management regimen: She is on fentanyl patch, oral Dilaudid, gabapentin, Decadron I have ordered abdominal/pelvis CT as per oncology recommendation.  I also talked with general surgery who will consult  Normocytic anemia: From chronic blood loss from anal cancer.  Currently hemoglobin stable.  HIV: CD4 of 76.  Not currently on PJP prophylaxis due to intolerance to Bactrim, dapsone.  Follows with ID.    Hypertension: Blood pressure medicines were discontinued on last admission.  Severe protein calorie malnutrition: Nutrition consulted  Debility/deconditioning: PT recommended SNF vs HH on last admission.  Patient interested on skilled nursing facility at this time.  Case manager following         DVT prophylaxis:SCD Code Status: DnR Family Communication: Discussed with daughter on phone on 03/05/2020 Status is: Inpatient  Remains inpatient appropriate because:Inpatient level of care appropriate due to severity of illness   Dispo: The patient is from: Home              Anticipated d/c is to:SnF              Anticipated d/c date is: 1-2 days              Patient currently is not medically stable to d/c.    Consultants: Oncology, surgery  Procedures: None  Antimicrobials:  Anti-infectives (From admission, onward)   Start     Dose/Rate Route Frequency Ordered Stop   03/12/20 1000  abacavir-dolutegravir-lamiVUDine (TRIUMEQ) [id number] MG per tablet 1 tablet        1 tablet Oral Daily 03/12/20  0914        Subjective:  Patient seen and examined at the bedside this morning.  Hemodynamically stable during my evaluation.  She was working with physical therapy.  She stated she still has severe pain but no bleeding at present.  Objective: Vitals:   03/12/20 1408 03/12/20 1849 03/12/20 2151 03/13/20  0521  BP: (!) 153/88 104/73 132/85 117/85  Pulse: 70 82 69 76  Resp: 15 15 15 15   Temp: 98.3 F (36.8 C) 98.2 F (36.8 C) 98.8 F (37.1 C) 97.9 F (36.6 C)  TempSrc: Oral Oral Oral Oral  SpO2: 93% 97% 99% 94%  Weight:      Height:        Intake/Output Summary (Last 24 hours) at 03/13/2020 5809 Last data filed at 03/12/2020 1414 Gross per 24 hour  Intake 240 ml  Output --  Net 240 ml   Filed Weights   03/12/20 0550  Weight: 58.5 kg    Examination:   General exam: Chronically ill looking, weak, debilitated, deconditioned HEENT:PERRL,Oral mucosa moist, Ear/Nose normal on gross exam Respiratory system: Bilateral equal air entry, normal vesicular breath sounds, no wheezes or crackles  Cardiovascular system: S1 & S2 heard, RRR. No JVD, murmurs, rubs, gallops or clicks.  Chemo-Port on the right chest Gastrointestinal system: Abdomen is nondistended, soft and nontender. No organomegaly or masses felt. Normal bowel sounds heard. Central nervous system: Alert and oriented. No focal neurological deficits. Extremities: No edema, no clubbing ,no cyanosis Skin: No rashes, lesions or ulcers,no icterus ,no pallor    Data Reviewed: I have personally reviewed following labs and imaging studies  CBC: Recent Labs  Lab 03/08/20 0520 03/12/20 0626  WBC 9.1 6.8  NEUTROABS 8.1* 5.8  HGB 9.5* 9.1*  HCT 29.3* 28.6*  MCV 97.0 100.0  PLT 199 983*   Basic Metabolic Panel: Recent Labs  Lab 03/12/20 0626  NA 139  K 3.7  CL 102  CO2 28  GLUCOSE 102*  BUN 20  CREATININE 0.61  CALCIUM 9.7   GFR: Estimated Creatinine Clearance: 51.6 mL/min (by C-G formula based on SCr of 0.61 mg/dL). Liver Function Tests: No results for input(s): AST, ALT, ALKPHOS, BILITOT, PROT, ALBUMIN in the last 168 hours. No results for input(s): LIPASE, AMYLASE in the last 168 hours. No results for input(s): AMMONIA in the last 168 hours. Coagulation Profile: No results for input(s): INR, PROTIME in the  last 168 hours. Cardiac Enzymes: No results for input(s): CKTOTAL, CKMB, CKMBINDEX, TROPONINI in the last 168 hours. BNP (last 3 results) No results for input(s): PROBNP in the last 8760 hours. HbA1C: No results for input(s): HGBA1C in the last 72 hours. CBG: No results for input(s): GLUCAP in the last 168 hours. Lipid Profile: No results for input(s): CHOL, HDL, LDLCALC, TRIG, CHOLHDL, LDLDIRECT in the last 72 hours. Thyroid Function Tests: No results for input(s): TSH, T4TOTAL, FREET4, T3FREE, THYROIDAB in the last 72 hours. Anemia Panel: No results for input(s): VITAMINB12, FOLATE, FERRITIN, TIBC, IRON, RETICCTPCT in the last 72 hours. Sepsis Labs: No results for input(s): PROCALCITON, LATICACIDVEN in the last 168 hours.  Recent Results (from the past 240 hour(s))  Culture, blood (routine x 2)     Status: None   Collection Time: 03/03/20  4:29 PM   Specimen: BLOOD  Result Value Ref Range Status   Specimen Description   Final    BLOOD RIGHT ANTECUBITAL Performed at [facility] [address] [place], [place] [address]    Special  Requests   Final    BOTTLES DRAWN AEROBIC AND ANAEROBIC Blood Culture adequate volume Performed at [facility] [address], [place], [place] [address]    Culture   Final    NO GROWTH 5 DAYS Performed at [facility], [place] [address] [place], [place] [address]    Report Status 03/09/2020 FINAL  Final  Respiratory Panel by RT PCR (Flu A&B, Covid) - Nasopharyngeal Swab     Status: None   Collection Time: 03/03/20  6:09 PM   Specimen: Nasopharyngeal Swab  Result Value Ref Range Status   SARS Coronavirus 2 by RT PCR NEGATIVE NEGATIVE Final    Comment: (NOTE) SARS-CoV-2 target nucleic acids are NOT DETECTED.  The SARS-CoV-2 RNA is generally detectable in upper respiratoy specimens during the acute phase of infection. The lowest concentration of SARS-CoV-2 viral copies this assay can detect is 131  copies/mL. A negative result does not preclude SARS-Cov-2 infection and should not be used as the sole basis for treatment or other patient management decisions. A negative result may occur with  improper specimen collection/handling, submission of specimen other than nasopharyngeal swab, presence of viral mutation(s) within the areas targeted by this assay, and inadequate number of viral copies (<131 copies/mL). A negative result must be combined with clinical observations, patient history, and epidemiological information. The expected result is Negative.  Fact Sheet for Patients:  PinkCheek.be  Fact Sheet for Healthcare Providers:  GravelBags.it  This test is no t yet approved or cleared by the [place] FDA and  has been authorized for detection and/or diagnosis of SARS-CoV-2 by FDA under an Emergency Use Authorization (EUA). This EUA will remain  in effect (meaning this test can be used) for the duration of the COVID-19 declaration under Section 564(b)(1) of the Act, 21 U.S.C. section 360bbb-3(b)(1), unless the authorization is terminated or revoked sooner.     Influenza A by PCR NEGATIVE NEGATIVE Final   Influenza B by PCR NEGATIVE NEGATIVE Final    Comment: (NOTE) The Xpert Xpress SARS-CoV-2/FLU/RSV assay is intended as an aid in  the diagnosis of influenza from Nasopharyngeal swab specimens and  should not be used as a sole basis for treatment. Nasal washings and  aspirates are unacceptable for Xpert Xpress SARS-CoV-2/FLU/RSV  testing.  Fact Sheet for Patients: PinkCheek.be  Fact Sheet for Healthcare Providers: GravelBags.it  This test is not yet approved or cleared by the [place] FDA and  has been authorized for detection and/or diagnosis of SARS-CoV-2 by  FDA under an Emergency Use Authorization (EUA). This EUA will remain  in effect (meaning  this test can be used) for the duration of the  Covid-19 declaration under Section 564(b)(1) of the Act, 21  U.S.C. section 360bbb-3(b)(1), unless the authorization is  terminated or revoked. Performed at [facility], [place] [address], [place], [place] [address]   Surgical PCR screen     Status: None   Collection Time: 03/08/20 10:14 PM   Specimen: Nasal Mucosa; Nasal Swab  Result Value Ref Range Status   MRSA, PCR NEGATIVE NEGATIVE Final   Staphylococcus aureus NEGATIVE NEGATIVE Final    Comment: (NOTE) The Xpert SA Assay (FDA approved for NASAL specimens in patients 4 years of age and older), is one component of a comprehensive surveillance program. It is not intended to diagnose infection nor to guide or monitor treatment. Performed at [facility], [place] [address], [place], [place] [address]   Respiratory Panel by  RT PCR (Flu A&B, Covid) - Nasopharyngeal Swab     Status: None   Collection Time: 03/12/20  7:17 AM   Specimen: Nasopharyngeal Swab  Result Value Ref Range Status   SARS Coronavirus 2 by RT PCR NEGATIVE NEGATIVE Final    Comment: (NOTE) SARS-CoV-2 target nucleic acids are NOT DETECTED.  The SARS-CoV-2 RNA is generally detectable in upper respiratoy specimens during the acute phase of infection. The lowest concentration of SARS-CoV-2 viral copies this assay can detect is 131 copies/mL. A negative result does not preclude SARS-Cov-2 infection and should not be used as the sole basis for treatment or other patient management decisions. A negative result may occur with  improper specimen collection/handling, submission of specimen other than nasopharyngeal swab, presence of viral mutation(s) within the areas targeted by this assay, and inadequate number of viral copies (<131 copies/mL). A negative result must be combined with clinical observations, patient history, and epidemiological information. The expected result is  Negative.  Fact Sheet for Patients:  PinkCheek.be  Fact Sheet for Healthcare Providers:  GravelBags.it  This test is no t yet approved or cleared by the [place] FDA and  has been authorized for detection and/or diagnosis of SARS-CoV-2 by FDA under an Emergency Use Authorization (EUA). This EUA will remain  in effect (meaning this test can be used) for the duration of the COVID-19 declaration under Section 564(b)(1) of the Act, 21 U.S.C. section 360bbb-3(b)(1), unless the authorization is terminated or revoked sooner.     Influenza A by PCR NEGATIVE NEGATIVE Final   Influenza B by PCR NEGATIVE NEGATIVE Final    Comment: (NOTE) The Xpert Xpress SARS-CoV-2/FLU/RSV assay is intended as an aid in  the diagnosis of influenza from Nasopharyngeal swab specimens and  should not be used as a sole basis for treatment. Nasal washings and  aspirates are unacceptable for Xpert Xpress SARS-CoV-2/FLU/RSV  testing.  Fact Sheet for Patients: PinkCheek.be  Fact Sheet for Healthcare Providers: GravelBags.it  This test is not yet approved or cleared by the [place] FDA and  has been authorized for detection and/or diagnosis of SARS-CoV-2 by  FDA under an Emergency Use Authorization (EUA). This EUA will remain  in effect (meaning this test can be used) for the duration of the  Covid-19 declaration under Section 564(b)(1) of the Act, 21  U.S.C. section 360bbb-3(b)(1), unless the authorization is  terminated or revoked. Performed at [facility], [place] [address], [place], [place] [address]          Radiology Studies: No results found.      Scheduled Meds: . abacavir-dolutegravir-lamiVUDine  1 tablet Oral Daily  . [START ON 03/15/2020] dexamethasone  4 mg Oral Daily   Followed by  . [START ON 03/18/2020] dexamethasone  2 mg Oral Daily  .  dexamethasone  6 mg Oral Daily  . DULoxetine  20 mg Oral Daily  . enoxaparin (LOVENOX) injection  40 mg Subcutaneous Daily  . fentaNYL  1 patch Transdermal Q72H  . gabapentin  600 mg Oral BID  . lactose free nutrition  237 mL Oral TID WC  . polyethylene glycol  17 g Oral Daily  . potassium chloride SA  20 mEq Oral Daily  . senna-docusate  1 tablet Oral BID  . sucralfate  1 g Oral BID   Continuous Infusions:    LOS: 1 day    Time spent: 15 mins,More than 50% of that time was spent in counseling and/or coordination of care.  Shelly Coss, MD [facility] P10/26/2021, 8:32 AM

## 2020-03-13 NOTE — TOC Progression Note (Signed)
Transition of Care [facility]) - Progression Note    Patient Details  Name: HAELYN FORGEY MRN: [id number] Date of Birth: 08-01-1950  Transition of Care [facility]) CM/SW Contact  Kassim Guertin, Marjie Skiff, RN Phone Number: 03/13/2020, 2:57 PM  Clinical Narrative:    SNF bed offers given to sister per pt request. Wandra Feinstein chosen and [place] liaison alerted of bed acceptance. Loma [place] from [place] to start insurance auth with [employer].   Expected Discharge Plan: [place] Barriers to Discharge: Continued Medical Work up  Expected Discharge Plan and Services Expected Discharge Plan: [place]   Discharge Planning Services: CM Consult Post Acute Care Choice: [place] Living arrangements for the past 2 months: Apartment                   Social Determinants of Health (SDOH) Interventions    Readmission Risk Interventions Readmission Risk Prevention Plan 03/13/2020 03/09/2020  Transportation Screening Complete Complete  PCP or Specialist Appt within 3-5 Days Complete Complete  HRI or [place] Complete Complete  Social Work Consult for [place] Planning/Counseling Complete Complete  Palliative Care Screening Complete Complete  Medication Review Press photographer) - Complete  Some recent data might be hidden

## 2020-03-13 NOTE — Progress Notes (Signed)
Initial Nutrition Assessment  DOCUMENTATION CODES:   Severe malnutrition in context of chronic illness  INTERVENTION:   -Boost Plus TID- Each supplement provides 360kcal and 14g protein.   -Magic cup BID with meals, each supplement provides 290 kcal and 9 grams of protein  NUTRITION DIAGNOSIS:   Severe Malnutrition related to chronic illness, cancer and cancer related treatments as evidenced by percent weight loss, severe fat depletion, severe muscle depletion.  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 Admitted for severe anal pain.  Pt familiar with RD from previous admission, last seen 10/18. Pt was diagnosed with severe malnutrition at that time. Pt was drinking Boost Plus.  Pt returned 1 day following discharge on 10/24. Boost Plus supplements have been resumed. Pt's severe malnutrition continues.Will order Magic cups with meals as well for additional kcals and protein.  Per weight records, pt has lost 25 lbs since 8/13 (16% wt loss x 2.5 months, significant for time frame).  Labs reviewed. Medications: Miralax, KLOR-CON, Senokot, Carafate  NUTRITION - FOCUSED PHYSICAL EXAM:    Most Recent Value  Orbital Region No depletion  Upper Arm Region Severe depletion  Thoracic and Lumbar Region Unable to assess  Buccal Region Moderate depletion  Temple Region Moderate depletion  Clavicle Bone Region Severe depletion  Clavicle and Acromion Bone Region Severe  depletion  Scapular Bone Region Moderate depletion  Dorsal Hand Moderate depletion  Patellar Region Unable to assess  Anterior Thigh Region Unable to assess  Posterior Calf Region Unable to assess  Edema (RD Assessment) None       Diet Order:   Diet Order            Diet regular Room service appropriate? Yes; Fluid consistency: Thin  Diet effective now                 EDUCATION NEEDS:   No education needs have been identified at this time  Skin:  Skin Assessment: Reviewed RN Assessment  Last BM:  10/26  Height:   Ht Readings from Last 1 Encounters:  03/12/20 5' (1.524 m)    Weight:   Wt Readings from Last 1 Encounters:  03/12/20 58.5 kg   BMI:  Body mass index is 25.19 kg/m.  Estimated Nutritional Needs:   Kcal:  1700-1900  Protein:  80-90g  Fluid:  1.9L/day   Clayton Bibles, MS, RD, LDN Inpatient Clinical Dietitian Contact information available via Amion

## 2020-03-14 DIAGNOSIS — Z515 Encounter for palliative care: Secondary | ICD-10-CM | POA: Diagnosis not present

## 2020-03-14 DIAGNOSIS — K6289 Other specified diseases of anus and rectum: Secondary | ICD-10-CM | POA: Diagnosis not present

## 2020-03-14 DIAGNOSIS — R531 Weakness: Secondary | ICD-10-CM | POA: Diagnosis not present

## 2020-03-14 DIAGNOSIS — Z7189 Other specified counseling: Secondary | ICD-10-CM | POA: Diagnosis not present

## 2020-03-14 NOTE — Progress Notes (Signed)
Daily Progress Note   Patient Name: Vanessa Beard       Date: 03/14/2020 DOB: [birth year]  Age: 69 y.o. MRN#: [id number] Attending Physician: Shelly Coss, MD Primary Care Physician: Seward Carol, MD Admit Date: 03/12/2020  Reason for Consultation/Follow-up: Establishing goals of care  Subjective:  " I talked with my son and daughter, I think I want the surgery. I don't want to keep on taking all of these drugs for my pain."  Patient states she would also like to further discuss regarding her surgery with her oncologist Dr Burr Medico.   Length of Stay: 2  Current Medications: Scheduled Meds:  . abacavir-dolutegravir-lamiVUDine  1 tablet Oral Daily  . [START ON 03/15/2020] dexamethasone  4 mg Oral Daily   Followed by  . [START ON 03/18/2020] dexamethasone  2 mg Oral Daily  . DULoxetine  20 mg Oral Daily  . enoxaparin (LOVENOX) injection  40 mg Subcutaneous Daily  . fentaNYL  1 patch Transdermal Q72H  . gabapentin  600 mg Oral BID  . lactose free nutrition  237 mL Oral TID WC  . liver oil-zinc oxide   Topical BID  . polyethylene glycol  17 g Oral Daily  . potassium chloride SA  20 mEq Oral Daily  . senna-docusate  1 tablet Oral BID  . sucralfate  1 g Oral BID    Continuous Infusions:   PRN Meds: belladonna-opium, butamben-tetracaine-benzocaine, HYDROmorphone (DILAUDID) injection, HYDROmorphone, zolpidem  Physical Exam         No distress Awake alert Regular work of breathing Abdomen not distended Non focal No edema S1 S 2   Vital Signs: BP 114/79 (BP Location: Left Arm)   Pulse 77   Temp 98.3 F (36.8 C) (Oral)   Resp 16   Ht 5' (1.524 m)   Wt 58.5 kg   SpO2 95%   BMI 25.19 kg/m  SpO2: SpO2: 95 % O2 Device: O2 Device: Room Air O2 Flow Rate:     Intake/output summary:   Intake/Output Summary (Last 24 hours) at 03/14/2020 1459 Last data filed at 03/14/2020 1404 Gross per 24 hour  Intake 420 ml  Output 750 ml  Net -330 ml   LBM: Last BM Date: 03/13/20 Baseline Weight: Weight: 58.5 kg Most recent weight: Weight: 58.5 kg      PPS 50%  Palliative Assessment/Data:      Patient Active Problem List   Diagnosis Date Noted  . Protein-calorie malnutrition, severe 03/05/2020  . Palliative care encounter 01/27/2020  . Goals of care, counseling/discussion 01/16/2020  . Hemorrhoid 12/16/2019  . Rectal pain 12/16/2019  . Anal cancer ([place]) 07/26/2019  . Rectal mass 07/14/2019  . Constipation 07/14/2019  . Anal fissure 07/14/2019  . Healthcare maintenance 06/30/2018  . Left knee pain 12/30/2017  . Left shoulder pain 12/30/2017  . Closed fracture of right tibial plateau 02/18/2017  . Benign hypertension 02/15/2017  . Screening examination for venereal disease 03/06/2015  . Encounter for long-term (current) use of medications 03/06/2015  . Elevated LFTs 12/11/2014  . Abnormal ultrasound 12/11/2014  . Abdominal pain, epigastric 12/11/2014  . ASCUS of cervix with negative high risk HPV 10/31/2013  . Gall bladder disease 02/03/2013  . 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  . Compression fracture of L3 lumbar vertebra 07/01/2011  . Thyroid dysfunction 07/01/2011  . Anemia 07/01/2011  . Sore throat 04/02/2011  . Hyperparathyroidism 02/10/2011  . Osteopenia 02/03/2011  . PAP SMEAR, LGSIL, ABNORMAL 12/21/2009  . COUGH 07/18/2009  . Chronic hepatitis C virus infection ([place]) 12/19/2008  . LEUKORRHEA 04/07/2008  . FATIGUE 04/07/2008  . BACK PAIN, LUMBAR 11/10/2007  . ALLERGIC RHINITIS 07/09/2007  . PNEUMOCYSTIS PNEUMONIA 05/07/2007  . Essential hypertension 05/07/2007  . DENTAL CARIES 05/07/2007  . INSOMNIA 05/07/2007  . PNEUMONIA, HX OF 05/07/2007  . HERPES ZOSTER, HX OF 05/07/2007  . Human  immunodeficiency virus (HIV) disease ([place]) 08/04/2006    Palliative Care Assessment & Plan   Patient Profile:    Assessment:  squamous cell ca anus Severe pain Necrotic anorectal mass Has history of HIV HTN  Recommendations/Plan:  patient states she is considering diverting colostomy. Med onc also following, palliative following for pain management.  Continue current mode of care.   Goals of Care and Additional Recommendations: Limitations on Scope of Treatment: Full Scope Treatment  Code Status:    Code Status Orders  (From admission, onward)         Start     Ordered   03/12/20 0940  Do not attempt resuscitation (DNR)  Continuous       Question Answer Comment  In the event of cardiac or respiratory ARREST Do not call a "code blue"   In the event of cardiac or respiratory ARREST Do not perform Intubation, CPR, defibrillation or ACLS   In the event of cardiac or respiratory ARREST Use medication by any route, position, wound care, and other measures to relive pain and suffering. May use oxygen, suction and manual treatment of airway obstruction as needed for comfort.      03/12/20 0939        Code Status History    Date Active Date Inactive Code Status Order ID Comments User Context   03/03/2020 1955 03/11/2020 2230 DNR [id number]  Lenore Cordia, MD ED   02/15/2017 2327 02/21/2017 1728 Full Code [id number]  Jani Gravel, MD Inpatient   Advance Care Planning Activity      Prognosis:  Unable to determine  Discharge Planning: To Be Determined  Care plan was discussed with  Patient.   Thank you for allowing the Palliative Medicine Team to assist in the care of this patient.   Time In: 1400 Time Out: 1425 Total Time 25 Prolonged Time Billed  no       Greater than 50%  of this time was spent counseling and  coordinating care related to the above assessment and plan.  Loistine Chance, MD  Please contact Palliative Medicine Team phone at [phone] for questions and  concerns.

## 2020-03-14 NOTE — Progress Notes (Signed)
Assessment & Plan: Squamous cell carcinoma of the anus  Asked to see patient and discuss diverting colostomy.  Lengthy discussion this morning regarding current symptoms, pain control, and wishes regarding surgery.  As for diverting colostomy, this would require general anesthesia and laparotomy in the OR.  I explained that diversion would not necessarily impact her pain.  It would help with leakage from the rectum, but not alleviate it completely.  Patient would have to learn ostomy care and management which would be continuous.  Finally, both her pain and incontinence are likely to resolve with successful treatment of her tumor with chemotherapy +/- radiation.  Patient has previously expressed a desire not to have surgery.  She would like to discuss with her family.  We will remain available if a decision is made to proceed.        Armandina Gemma, MD       [facility]       Office: [phone]   Chief Complaint: Squamous cell carcinoma of the anus  Subjective: Patient in bed, lying on left side. Comfortable.  Notes rectal leakage when she urinates.  Objective: Vital signs in last 24 hours: Temp:  [98.5 F (36.9 C)-98.9 F (37.2 C)] 98.9 F (37.2 C) (10/27 0424) Pulse Rate:  [65-75] 65 (10/27 0424) Resp:  [18] 18 (10/27 0424) BP: (103-129)/(74-86) 126/84 (10/27 0424) SpO2:  [93 %-99 %] 94 % (10/27 0424) Last BM Date: 03/13/20  Intake/Output from previous day: 10/26 0701 - 10/27 0700 In: -  Out: 750 [Urine:750] Intake/Output this shift: No intake/output data recorded.  Physical Exam: HEENT - sclerae clear, mucous membranes moist Neuro - alert & oriented, no focal deficits  Lab Results:  Recent Labs    03/12/20 0626  WBC 6.8  HGB 9.1*  HCT 28.6*  PLT 144*   BMET Recent Labs    03/12/20 0626  NA 139  K 3.7  CL 102  CO2 28  GLUCOSE 102*  BUN 20  CREATININE 0.61  CALCIUM 9.7   PT/INR No results for input(s): LABPROT, INR in the  last 72 hours. Comprehensive Metabolic Panel:    Component Value Date/Time   NA 139 03/12/2020 0626   NA 132 (L) 03/05/2020 0541   NA 142 08/31/2013 1515   NA 139 03/01/2013 1524   K 3.7 03/12/2020 0626   K 3.9 03/05/2020 0541   K 3.4 (L) 08/31/2013 1515   K 3.7 03/01/2013 1524   CL 102 03/12/2020 0626   CL 101 03/05/2020 0541   CL 104 07/07/2012 1512   CO2 28 03/12/2020 0626   CO2 24 03/05/2020 0541   CO2 27 08/31/2013 1515   CO2 25 03/01/2013 1524   BUN 20 03/12/2020 0626   BUN 9 03/05/2020 0541   BUN 14.7 08/31/2013 1515   BUN 11.6 03/01/2013 1524   CREATININE 0.61 03/12/2020 0626   CREATININE 0.59 03/05/2020 0541   CREATININE 0.82 02/17/2020 0824   CREATININE 0.81 01/27/2020 0721   CREATININE 0.87 08/08/2019 1535   CREATININE 1.04 (H) 02/07/2019 1528   CREATININE 0.8 08/31/2013 1515   CREATININE 0.8 03/01/2013 1524   GLUCOSE 102 (H) 03/12/2020 0626   GLUCOSE 87 03/05/2020 0541   GLUCOSE 69 (L) 08/31/2013 1515   GLUCOSE 100 03/01/2013 1524   GLUCOSE 88 07/07/2012 1512   CALCIUM 9.7 03/12/2020 0626   CALCIUM 9.4 03/05/2020 0541   CALCIUM 9.6 08/31/2013 1515   CALCIUM 9.3 03/01/2013 1524   AST 23 03/03/2020 1545  AST 24 03/01/2020 1208   AST 22 02/17/2020 0824   AST 19 01/27/2020 0721   AST 25 08/31/2013 1515   AST 36 (H) 03/01/2013 1524   ALT 12 03/03/2020 1545   ALT 11 03/01/2020 1208   ALT 8 02/17/2020 0824   ALT <6 01/27/2020 0721   ALT 9 08/31/2013 1515   ALT 21 03/01/2013 1524   ALKPHOS 73 03/03/2020 1545   ALKPHOS 79 03/01/2020 1208   ALKPHOS 99 08/31/2013 1515   ALKPHOS 85 03/01/2013 1524   BILITOT 0.7 03/03/2020 1545   BILITOT 1.1 03/01/2020 1208   BILITOT 0.7 02/17/2020 0824   BILITOT 0.7 01/27/2020 0721   BILITOT 0.67 08/31/2013 1515   BILITOT 0.64 03/01/2013 1524   PROT 7.5 03/03/2020 1545   PROT 7.4 03/01/2020 1208   PROT 8.8 (H) 08/31/2013 1515   PROT 9.3 (H) 03/01/2013 1524   ALBUMIN 2.6 (L) 03/03/2020 1545   ALBUMIN 2.5 (L)  03/01/2020 1208   ALBUMIN 3.7 08/31/2013 1515   ALBUMIN 3.4 (L) 03/01/2013 1524    Studies/Results: CT ABDOMEN PELVIS W CONTRAST  Result Date: 03/14/2020 CLINICAL DATA:  Squamous cell carcinoma of the anus. EXAM: CT ABDOMEN AND PELVIS WITH CONTRAST TECHNIQUE: Multidetector CT imaging of the abdomen and pelvis was performed using the standard protocol following bolus administration of intravenous contrast. CONTRAST:  115mL OMNIPAQUE IOHEXOL 300 MG/ML  SOLN COMPARISON:  03/01/2020 FINDINGS: Lower chest: Bronchiectasis with parenchymal scarring noted in the lung bases bilaterally. Hepatobiliary: No suspicious focal abnormality within the liver parenchyma. Gallbladder not visualized in either markedly decompressed or surgically absent. No intrahepatic or extrahepatic biliary dilation. Pancreas: No focal mass lesion. No dilatation of the main duct. No intraparenchymal cyst. No peripancreatic edema. Spleen: No splenomegaly. No focal mass lesion. Adrenals/Urinary Tract: No adrenal nodule or mass. Stable 9 mm low-density lesion upper pole right kidney, incompletely characterized but likely a cyst. Early excretion of contrast material noted in calices of both kidneys. No renal stone disease visible on the exam from 12 days ago. No evidence for hydroureter. 2.6 x 1.3 cm region of apparent focal posterior bladder wall thickening (image 60/series 2) is indeterminate as this finding was not visible on the scan from 12 days ago. Although today it is highly suspicious for urothelial neoplasm, given that it appears new in the interval, layering debris/blood products is the probable etiology. Nevertheless, close follow-up recommended. Stomach/Bowel: Stomach is unremarkable. No gastric wall thickening. No evidence of outlet obstruction. Duodenum is normally positioned as is the ligament of Treitz. No small bowel wall thickening. No small bowel dilatation. The terminal ileum is normal. The appendix is normal. Wall thickening  in the low rectum/anus again noted with heterogeneous enhancement, features compatible with the patient's known neoplasm. Vascular/Lymphatic: There is abdominal aortic atherosclerosis without aneurysm. There is no gastrohepatic or hepatoduodenal ligament lymphadenopathy. No retroperitoneal or mesenteric lymphadenopathy. No pelvic sidewall lymphadenopathy. Reproductive: Calcified fibroid noted in the uterus. There is no adnexal mass. Other: No intraperitoneal free fluid. Musculoskeletal: Soft tissue gas in the pelvic floor compatible with recent CT-guided bilateral pudendal nerve block. No worrisome lytic or sclerotic osseous abnormality. Multilevel compression deformity noted lower thoracic and lumbar spine. IMPRESSION: 1. 2.6 x 1.3 cm region of apparent focal posterior bladder wall thickening is indeterminate as this finding was not visible on the scan from 12 days ago. Although today, it has an appearance suspicious for urothelial neoplasm, given that it was not visible previously, layering debris/blood products is the probable etiology. Nevertheless, close follow-up  recommended to ensure that it resolves. 2. No substantial change in the patient's known anal rectal mass. No evidence for metastatic disease in the abdomen or pelvis. 3. Soft tissue gas in the pelvic floor compatible with recent CT-guided bilateral pudendal nerve block. 4. Bronchiectasis with parenchymal scarring in the lung bases bilaterally. 5. Aortic Atherosclerosis (ICD10-I70.0). Electronically Signed   By: Misty Stanley M.D.   On: 03/14/2020 05:30      Armandina Gemma 03/14/2020  Patient ID: Vanessa Beard, female   DOB: 1951-04-29, 69 y.o.   MRN: [id number]

## 2020-03-14 NOTE — Progress Notes (Signed)
HEMATOLOGY-ONCOLOGY PROGRESS NOTE  SUBJECTIVE: Patient is doing better today, pain better controlled, she has only requested two doses of iv dilaudid today. Still has stool incontinence.   Oncology History Overview Note  Cancer Staging Anal cancer ([place]) Staging form: Anus, AJCC 8th Edition - Clinical: Stage IV (cT2, cN0, cM1) - Signed by Vanessa Merle, MD on 08/12/2019    Anal cancer ([place])  07/19/2019 Procedure   Colonoscopy by Dr Havery Moros 07/19/19  IMPRESSION - Ulcer noted at the anal canal in posterior midline canal that extends into the distal rectum on digital rectal exam, nodular and somewhat hard to palpation. Endoscopic images show nodular tissue in the area, concerning for malignant ulcer. - The examined portion of the ileum was normal. - One 3 mm polyp at the hepatic flexure, removed with a cold snare. Resected and retrieved. - One 5 mm polyp in the transverse colon, removed with a cold snare. Resected and retrieved. - Diverticulosis in the ascending colon. - The examination was otherwise normal.   07/19/2019 Initial Biopsy   Diagnosis 07/19/19 1. Transverse Colon Polyp, hepatic flexure (2) - TUBULAR ADENOMA (1 OF 3 FRAGMENTS) - BENIGN COLONIC MUCOSA (2 OF 3 FRAGMENTS) - NO HIGH GRADE DYSPLASIA OR MALIGNANCY IDENTIFIED 2. 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   Sub-cm hypermetabolic lymph nodes in posterior perirectal space, right inguinal region, right iliac chain, and gastrohepatic ligament, suspicious for metastatic disease.   Small hypermetabolic left axillary lymph nodes. This would be unusual location for  metastatic anal carcinoma. Recommend clinical correlation for possibility the patient has had recent COVID vaccination in the left arm, which could explain this finding.   Multifocal airspace disease in both lower lungs with marked hypermetabolic activity. This favors infectious or inflammatory etiology, and is not typical for pulmonary metastases. Short-term follow-up by chest CT is recommended.   08/15/2019 - 10/05/2019 Radiation Therapy   Concurrent ChemoRT with Dr. Lisbeth Renshaw starting 08/15/19-10/05/19   08/15/2019 - 09/12/2019 Chemotherapy   Concurrent ChemoRT with Mitomycin and 5FU on week 1 and 5 starting 08/15/19 with week 5 dose on 09/12/19.    11/02/2019 Imaging   CT CAP w contrast  IMPRESSION: No acute findings in the chest, abdomen or pelvis.   Biapical and bibasilar scarring. Bronchiectasis and paraseptal emphysema in the lung bases.   Tortuous aorta.   Small umbilical hernia containing fat.   01/05/2020 Imaging   PET SCAN IMPRESSION: 1. Partial metabolic response. Persistent anal mass hypermetabolism, decreased. No residual hypermetabolic nodal metastases in the abdomen or pelvis. Left axillary hypermetabolic lymph nodes are decreased in size and metabolism. No new or progressive hypermetabolic metastatic disease. 2. Hypermetabolism associated with patchy consolidative airspace opacities in the mid to lower lungs bilaterally, decreased in extent and metabolism, favoring resolving inflammatory opacities. 3. Aortic Atherosclerosis (ICD10-I70.0) and Emphysema (ICD10-J43.9).   01/27/2020 - 02/17/2020 Chemotherapy   The patient had pembrolizumab (KEYTRUDA) 200 mg in sodium chloride 0.9 % 50 mL chemo infusion, 200 mg, Intravenous, Once, 2 of 6 cycles Administration: 200 mg (01/27/2020), 200 mg (02/17/2020)  for chemotherapy treatment.    03/14/2020 -  Chemotherapy   The patient had dexamethasone (  DECADRON) 4 MG tablet, 8 mg, Oral, Daily, 0 of 1 cycle, Start date: --, End date: --  palonosetron (ALOXI) injection 0.25 mg, 0.25 mg, Intravenous,  Once, 0 of 4 cycles leucovorin 624 mg in dextrose 5 % 250 mL infusion, 400 mg/m2 = 624 mg, Intravenous,  Once, 0 of 4 cycles oxaliplatin (ELOXATIN) 110 mg in dextrose 5 % 500 mL chemo infusion, 70 mg/m2 = 110 mg (100 % of original dose 70 mg/m2), Intravenous,  Once, 0 of 4 cycles Dose modification: 70 mg/m2 (original dose 70 mg/m2, Cycle 1, Reason: Provider Judgment) fluorouracil (ADRUCIL) 3,100 mg in sodium chloride 0.9 % 88 mL chemo infusion, 2,000 mg/m2 = 3,100 mg (100 % of original dose 2,000 mg/m2), Intravenous, 1 Day/Dose, 0 of 4 cycles Dose modification: 2,000 mg/m2 (original dose 2,000 mg/m2, Cycle 1, Reason: Provider Judgment)  for chemotherapy treatment.       REVIEW OF SYSTEMS:   Constitutional: Denies fevers, chills  Eyes: Denies blurriness of vision Ears, nose, mouth, throat, and face: Denies mucositis or sore throat Respiratory: Denies cough, dyspnea or wheezes Cardiovascular: Denies palpitation, chest discomfort Gastrointestinal: Continues to have rectal pain Skin: Denies abnormal skin rashes Lymphatics: Denies new lymphadenopathy or easy bruising Neurological:Denies numbness, tingling or new weaknesses Behavioral/Psych: Mood is stable, no new changes  Extremities: No lower extremity edema All other systems were reviewed with the patient and are negative.  I have reviewed the past medical history, past surgical history, social history and family history with the patient and they are unchanged from previous note.   PHYSICAL EXAMINATION: ECOG PERFORMANCE STATUS: 3 - Symptomatic, >50% confined to bed  Vitals:   03/14/20 0424 03/14/20 1407  BP: 126/84 114/79  Pulse: 65 77  Resp: 18 16  Temp: 98.9 F (37.2 C) 98.3 F (36.8 C)  SpO2: 94% 95%   Filed Weights   03/12/20 0550  Weight: 129 lb (58.5 kg)    Intake/Output from previous day: 10/26 0701 - 10/27 0700 In: -  Out: 750 [Urine:750]   GENERAL:alert, no distress and comfortable SKIN: skin color, texture, turgor are normal, no rashes or significant lesions NEURO: alert & oriented x 3 with fluent speech, no focal motor/sensory deficits  LABORATORY DATA:  I have reviewed the data as listed CMP Latest Ref Rng & Units 03/12/2020 03/05/2020 03/04/2020  Glucose 70 - 99 mg/dL 102(H) 87 89  BUN 8 - 23 mg/dL 20 9 11   Creatinine 0.44 - 1.00 mg/dL 0.61 0.59 0.59  Sodium 135 - 145 mmol/L 139 132(L) 132(L)  Potassium 3.5 - 5.1 mmol/L 3.7 3.9 4.0  Chloride 98 - 111 mmol/L 102 101 101  CO2 22 - 32 mmol/L 28 24 25   Calcium 8.9 - 10.3 mg/dL 9.7 9.4 9.4  Total Protein 6.5 - 8.1 g/dL - - -  Total Bilirubin 0.3 - 1.2 mg/dL - - -  Alkaline Phos 38 - 126 U/L - - -  AST 15 - 41 U/L - - -  ALT 0 - 44 U/L - - -    Lab Results  Component Value Date   WBC 6.8 03/12/2020   HGB 9.1 (L) 03/12/2020   HCT 28.6 (L) 03/12/2020   MCV 100.0 03/12/2020   PLT 144 (L) 03/12/2020   NEUTROABS 5.8 03/12/2020    CT GUIDED NEEDLE PLACEMENT  Result Date: 03/06/2020 INDICATION: 69 year old female with anal cancer, non remaining pain, referred for pudendal nerve block EXAM: CT GUIDANCE NEEDLE PLACEMENT MEDICATIONS: None. ANESTHESIA/SEDATION: None FLUOROSCOPY TIME:  CT COMPLICATIONS: None PROCEDURE:  Informed written consent was obtained from the patient after a thorough discussion of the procedural risks, benefits and alternatives. All questions were addressed. Maximal Sterile Barrier Technique was utilized including caps, mask, sterile gowns, sterile gloves, sterile drape, hand hygiene and skin antiseptic. A timeout was performed prior to the initiation of the procedure. Patient was position prone position on the CT gantry table. Scout CT was acquired for planning purposes. The patient was then prepped and draped in the usual sterile fashion. 1% lidocaine was used for local anesthesia. Using CT guidance, bilateral transgluteal approach was performed using 22  gauge 15 cm Chiba needles, targeting the pudendal canal within the bilateral pelvis. Once we confirmed needle tip position with CT, small amount of 40% dilute contrast was injected to assure that the needle tip was not in a vascular space. We then injected a solution of 8 cc 5% bupivacaine and 1 mL (40 mg) Kenalog at both needle sites. Needles were removed and a final CT was performed. Patient tolerated the procedure well and remained hemodynamically stable throughout. No complications were encountered and no significant blood loss. IMPRESSION: Status post CT-guided needle placement at the bilateral pudendal canal for bilateral pudendal nerve block Signed, Dulcy Fanny. Dellia Nims, RPVI [facility] Electronically Signed   By: Corrie Mckusick D.O.   On: 03/06/2020 16:31   CT GUIDED NEEDLE PLACEMENT  Result Date: 03/06/2020 INDICATION: 69 year old female with anal cancer, non remaining pain, referred for pudendal nerve block EXAM: CT GUIDANCE NEEDLE PLACEMENT MEDICATIONS: None. ANESTHESIA/SEDATION: None FLUOROSCOPY TIME:  CT COMPLICATIONS: None PROCEDURE: Informed written consent was obtained from the patient after a thorough discussion of the procedural risks, benefits and alternatives. All questions were addressed. Maximal Sterile Barrier Technique was utilized including caps, mask, sterile gowns, sterile gloves, sterile drape, hand hygiene and skin antiseptic. A timeout was performed prior to the initiation of the procedure. Patient was position prone position on the CT gantry table. Scout CT was acquired for planning purposes. The patient was then prepped and draped in the usual sterile fashion. 1% lidocaine was used for local anesthesia. Using CT guidance, bilateral transgluteal approach was performed using 22 gauge 15 cm Chiba needles, targeting the pudendal canal within the bilateral pelvis. Once we confirmed needle tip position with CT, small amount of  40% dilute contrast was injected to assure that the needle tip was not in a vascular space. We then injected a solution of 8 cc 5% bupivacaine and 1 mL (40 mg) Kenalog at both needle sites. Needles were removed and a final CT was performed. Patient tolerated the procedure well and remained hemodynamically stable throughout. No complications were encountered and no significant blood loss. IMPRESSION: Status post CT-guided needle placement at the bilateral pudendal canal for bilateral pudendal nerve block Signed, Dulcy Fanny. Dellia Nims, RPVI [facility] Electronically Signed   By: Corrie Mckusick D.O.   On: 03/06/2020 16:31   CT ABDOMEN PELVIS W CONTRAST  Result Date: 03/14/2020 CLINICAL DATA:  Squamous cell carcinoma of the anus. EXAM: CT ABDOMEN AND PELVIS WITH CONTRAST TECHNIQUE: Multidetector CT imaging of the abdomen and pelvis was performed using the standard protocol following bolus administration of intravenous contrast. CONTRAST:  18mL OMNIPAQUE IOHEXOL 300 MG/ML  SOLN COMPARISON:  03/01/2020 FINDINGS: Lower chest: Bronchiectasis with parenchymal scarring noted in the lung bases bilaterally. Hepatobiliary: No suspicious focal abnormality within the liver parenchyma. Gallbladder not visualized in either markedly decompressed or surgically absent. No intrahepatic  or extrahepatic biliary dilation. Pancreas: No focal mass lesion. No dilatation of the main duct. No intraparenchymal cyst. No peripancreatic edema. Spleen: No splenomegaly. No focal mass lesion. Adrenals/Urinary Tract: No adrenal nodule or mass. Stable 9 mm low-density lesion upper pole right kidney, incompletely characterized but likely a cyst. Early excretion of contrast material noted in calices of both kidneys. No renal stone disease visible on the exam from 12 days ago. No evidence for hydroureter. 2.6 x 1.3 cm region of apparent focal posterior bladder wall thickening (image 60/series  2) is indeterminate as this finding was not visible on the scan from 12 days ago. Although today it is highly suspicious for urothelial neoplasm, given that it appears new in the interval, layering debris/blood products is the probable etiology. Nevertheless, close follow-up recommended. Stomach/Bowel: Stomach is unremarkable. No gastric wall thickening. No evidence of outlet obstruction. Duodenum is normally positioned as is the ligament of Treitz. No small bowel wall thickening. No small bowel dilatation. The terminal ileum is normal. The appendix is normal. Wall thickening in the low rectum/anus again noted with heterogeneous enhancement, features compatible with the patient's known neoplasm. Vascular/Lymphatic: There is abdominal aortic atherosclerosis without aneurysm. There is no gastrohepatic or hepatoduodenal ligament lymphadenopathy. No retroperitoneal or mesenteric lymphadenopathy. No pelvic sidewall lymphadenopathy. Reproductive: Calcified fibroid noted in the uterus. There is no adnexal mass. Other: No intraperitoneal free fluid. Musculoskeletal: Soft tissue gas in the pelvic floor compatible with recent CT-guided bilateral pudendal nerve block. No worrisome lytic or sclerotic osseous abnormality. Multilevel compression deformity noted lower thoracic and lumbar spine. IMPRESSION: 1. 2.6 x 1.3 cm region of apparent focal posterior bladder wall thickening is indeterminate as this finding was not visible on the scan from 12 days ago. Although today, it has an appearance suspicious for urothelial neoplasm, given that it was not visible previously, layering debris/blood products is the probable etiology. Nevertheless, close follow-up recommended to ensure that it resolves. 2. No substantial change in the patient's known anal rectal mass. No evidence for metastatic disease in the abdomen or pelvis. 3. Soft tissue gas in the pelvic floor compatible with recent CT-guided bilateral pudendal nerve block. 4.  Bronchiectasis with parenchymal scarring in the lung bases bilaterally. 5. Aortic Atherosclerosis (ICD10-I70.0). Electronically Signed   By: Misty Stanley M.D.   On: 03/14/2020 05:30   CT ABDOMEN PELVIS W CONTRAST  Result Date: 03/01/2020 CLINICAL DATA:  Colorectal cancer, new anal mass, difficulty voiding EXAM: CT ABDOMEN AND PELVIS WITH CONTRAST TECHNIQUE: Multidetector CT imaging of the abdomen and pelvis was performed using the standard protocol following bolus administration of intravenous contrast. CONTRAST:  146mL OMNIPAQUE IOHEXOL 300 MG/ML  SOLN COMPARISON:  11/02/2019 FINDINGS: Lower chest: No acute abnormality. Fibrotic scarring of the included bilateral lung bases. Hepatobiliary: No focal liver abnormality is seen. Status post cholecystectomy. Postoperative biliary dilatation. Pancreas: Unremarkable. No pancreatic ductal dilatation or surrounding inflammatory changes. Spleen: Normal in size without significant abnormality. Adrenals/Urinary Tract: Adrenal glands are unremarkable. Kidneys are normal, without renal calculi, solid lesion, or hydronephrosis. Bladder is unremarkable. Stomach/Bowel: Stomach is within normal limits. Appendix appears normal. There is an irregular, rim enhancing soft tissue mass about the anus, containing air and fluid, measuring overall approximately 5.4 x 2.4 cm (series 2, image 76). No significant change in perirectal and presacral soft tissue stranding (series 2, image 67). Vascular/Lymphatic: Scattered aortic atherosclerosis. No enlarged abdominal or pelvic lymph nodes. Reproductive: Small calcified uterine fibroids. Other: No abdominal wall hernia or abnormality. No abdominopelvic ascites. Musculoskeletal: No acute  or significant osseous findings. Unchanged superior endplate deformities of T12 and L4. IMPRESSION: 1. There is an irregular, rim enhancing soft tissue mass about the anus, containing air and fluid, measuring overall approximately 5.4 x 2.4 cm. Findings most  suggestive of necrotic anorectal mass. Correlate with physical exam findings. 2. Unchanged fat stranding of the perirectal and presacral soft tissues, in keeping with prior radiation therapy. 3. No evidence of lymphadenopathy or metastatic disease in the abdomen or pelvis. 4. Aortic Atherosclerosis (ICD10-I70.0). Electronically Signed   By: Eddie Candle M.D.   On: 03/01/2020 13:57   DG Chest Port 1 View  Result Date: 03/09/2020 CLINICAL DATA:  Status post Port-A-Cath placement. EXAM: PORTABLE CHEST 1 VIEW COMPARISON:  May 04, 2018. FINDINGS: Stable cardiomegaly. Interval placement of right internal jugular Port-A-Cath with distal tip in expected position of the right brachiocephalic vein. No pneumothorax is noted. Mild bibasilar subsegmental atelectasis is noted. Bony thorax is unremarkable. IMPRESSION: Interval placement of right internal jugular Port-A-Cath with distal tip in expected position of the right brachiocephalic vein. Electronically Signed   By: Marijo Conception M.D.   On: 03/09/2020 09:25   DG C-Arm 1-60 Min-No Report  Result Date: 03/09/2020 Fluoroscopy was utilized by the requesting physician.  No radiographic interpretation.    ASSESSMENT AND PLAN: 1.  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 2.  Anemia 3.  HIV 4.  History of hepatitis C 5.  Hypertension 6.  Hyperparathyroidism 7.  Osteoporosis  -I reviewed her CT scan findings from today, which overall stable.  There is a 2.6 x 1.3 cm focal posterior bladder wall thickening, indeterminate.  I do not feel that the cause of her rectal pain. -She has seen by general surgeon Dr. Harlow Asa.  Diverting colostomy was discussed with her.  I again reviewed the benefit of colostomy, which is mainly for stool incontinence and likely will only slightly improve her rectal pain, but it unlikely dramatically improve her pain.  I made that very clear with patient and her daughter on the  phone.   -We also discussed option of palliative chemotherapy, if her pain is able to be well controlled, and her performance status improved.  We also discussed the option of palliative care and hospice alone, to focus on her quality of life.  -After lengthy discussion, patient would like to proceed diverting colostomy to improve her quality of life.  She does not want chemotherapy after surgery (she changed her mind), and is open to home hospice after surgery. -I spoke with her daughter on the phone today -I paged Dr. Harlow Asa, and plan to review with him about the surgery. -I will f/u. Appreciate palliative care's input.   Vanessa Beard  03/14/2020  5:52 PM

## 2020-03-14 NOTE — Progress Notes (Signed)
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  General surgery and oncology were following.  After discussion, patient wanted to go for conservative management/wants to avoid surgery and wants to continue chemotherapy as an outpatient.  Palliative care consulted for pain management and IR did CT-guided nerve block.  Hospital course remarkable for significant pain requiring IV pain medications and prolonged hospital stay. She was given option for skilled nursing facility discharge but she wanted to go home with home health.  Now she is considering skilled nursing facility placement.  Admitted this time for severe anal pain.  Oncology following.  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  She presented with rectal pain, bleeding.  Found to have local progression of disease on last admission.  Continue pain management and supportive care. General surgery were also following on last admission.  General surgery considered  fecal diversion with sigmoid loop colostomy for palliative approach but patient didnt prefer surgery. Oncology is planning to continue chemotherapy as an outpatient. We also requested palliative care  evaluation for pain management. IR did CT-guided nerve block on 10.19.21. Goal is  to control her pain better. Continue bowel regimen to avoid constipation. Continue current pain management regimen: She is on fentanyl patch, oral Dilaudid, gabapentin, Decadron Abdominal/pelvis CT ordered during this admission did not show any significant change in the and anal mass.  General surgery again consulted and given option for diverting colostomy and patient has not decided yet/plan to discuss with family today and get back to [place].  Normocytic anemia: From chronic blood loss from anal cancer.  Currently hemoglobin stable.  HIV: CD4 of 76.  Not currently on PJP prophylaxis due to intolerance to Bactrim, dapsone.  Follows with ID.    Hypertension: Blood pressure medicines were discontinued on last admission.  Severe protein calorie malnutrition: Nutrition consulted  Debility/deconditioning: PT recommended SNF vs HH on last admission.  Patient interested on skilled nursing facility at this time.  Case manager following  Nutrition Problem: Severe Malnutrition Etiology: chronic illness, cancer and cancer related treatments      DVT prophylaxis:SCD Code Status: DnR Family Communication: Discussed with daughter on phone on 03/10/20 Status is: Inpatient  Remains inpatient appropriate because:Inpatient level of care appropriate due to severity of illness   Dispo: The patient is from: Home              Anticipated d/c is to:SnF              Anticipated d/c date is: 1-2 days              Patient currently is not medically stable to d/c. Patient awaiting to discuss with family about palliative surgery   Consultants: Oncology, surgery  Procedures: None  Antimicrobials:  Anti-infectives (From admission, onward)   Start     Dose/Rate Route Frequency Ordered Stop   03/12/20 1000  abacavir-dolutegravir-lamiVUDine (TRIUMEQ) 600-50-300 MG per tablet 1 tablet        1 tablet Oral Daily 03/12/20 0914         Subjective:  Patient seen and examined at the bedside this morning.  Very comfortable today.  Denies any significant pain today.  She said she will discuss with her daughter/son about the diverting colostomy offered by general surgery and get back to [place].  Objective: Vitals:   03/13/20 1434 03/13/20 2001 03/13/20 2026 03/14/20 0424  BP: 103/74  129/86 126/84  Pulse: 75  65 65  Resp: 18  18 18   Temp: 98.5 F (36.9 C)  98.5 F (36.9 C) 98.9 F (37.2 C)  TempSrc: Oral  Oral Oral  SpO2: 99% 93% 93% 94%  Weight:      Height:        Intake/Output Summary (Last 24 hours) at 03/14/2020 0824 Last data filed at 03/14/2020 0615 Gross per 24 hour  Intake --  Output 750 ml  Net -750 ml   Filed Weights   03/12/20 0550  Weight: 58.5 kg    Examination:   General exam: Generalized weakness, chronically ill looking HEENT:PERRL,Oral mucosa moist, Ear/Nose normal on gross exam Respiratory system: Bilateral equal air entry, normal vesicular breath sounds, no wheezes or crackles  Cardiovascular system: S1 & S2 heard, RRR. No JVD, murmurs, rubs, gallops or clicks.Chemo port on the right chest Gastrointestinal system: Abdomen is nondistended, soft and nontender. No organomegaly or masses felt. Normal bowel sounds heard. Central nervous system: Alert and oriented. No focal neurological deficits. Extremities: No edema, no clubbing ,no cyanosis Skin: No rashes, lesions or ulcers,no icterus ,no pallor   Data Reviewed: I have personally reviewed following labs and imaging studies  CBC: Recent Labs  Lab 03/08/20 0520 03/12/20 0626  WBC 9.1 6.8  NEUTROABS 8.1* 5.8  HGB 9.5* 9.1*  HCT 29.3* 28.6*  MCV 97.0 100.0  PLT 199 174*   Basic Metabolic Panel: Recent Labs  Lab 03/12/20 0626  NA 139  K 3.7  CL 102  CO2 28  GLUCOSE 102*  BUN 20  CREATININE 0.61  CALCIUM 9.7   GFR: Estimated Creatinine Clearance: 51.6 mL/min (by C-G formula based on SCr of 0.61 mg/dL). Liver  Function Tests: No results for input(s): AST, ALT, ALKPHOS, BILITOT, PROT, ALBUMIN in the last 168 hours. No results for input(s): LIPASE, AMYLASE in the last 168 hours. No results for input(s): AMMONIA in the last 168 hours. Coagulation Profile: No results for input(s): INR, PROTIME in the last 168 hours. Cardiac Enzymes: No results for input(s): CKTOTAL, CKMB, CKMBINDEX, TROPONINI in the last 168 hours. BNP (last 3 results) No results for input(s): PROBNP in the last 8760 hours. HbA1C: No results for input(s): HGBA1C in the last 72 hours. CBG: No results for input(s): GLUCAP in the last 168 hours. Lipid Profile: No results for input(s): CHOL, HDL, LDLCALC, TRIG, CHOLHDL, LDLDIRECT in the last 72 hours. Thyroid Function Tests: No results for input(s): TSH, T4TOTAL, FREET4, T3FREE, THYROIDAB in the last 72 hours. Anemia Panel: No results for input(s): VITAMINB12, FOLATE, FERRITIN, TIBC, IRON, RETICCTPCT in the last 72 hours. Sepsis Labs: No results for input(s): PROCALCITON, LATICACIDVEN in the last 168 hours.  Recent Results (from the past 240 hour(s))  Surgical PCR screen     Status: None   Collection Time: 03/08/20 10:14 PM  Specimen: Nasal Mucosa; Nasal Swab  Result Value Ref Range Status   MRSA, PCR NEGATIVE NEGATIVE Final   Staphylococcus aureus NEGATIVE NEGATIVE Final    Comment: (NOTE) The Xpert SA Assay (FDA approved for NASAL specimens in patients 74 years of age and older), is one component of a comprehensive surveillance program. It is not intended to diagnose infection nor to guide or monitor treatment. Performed at [facility], [place] [address], [place], [place] [address]   Respiratory Panel by RT PCR (Flu A&B, Covid) - Nasopharyngeal Swab     Status: None   Collection Time: 03/12/20  7:17 AM   Specimen: Nasopharyngeal Swab  Result Value Ref Range Status   SARS Coronavirus 2 by RT PCR NEGATIVE NEGATIVE Final    Comment: (NOTE) SARS-CoV-2  target nucleic acids are NOT DETECTED.  The SARS-CoV-2 RNA is generally detectable in upper respiratoy specimens during the acute phase of infection. The lowest concentration of SARS-CoV-2 viral copies this assay can detect is 131 copies/mL. A negative result does not preclude SARS-Cov-2 infection and should not be used as the sole basis for treatment or other patient management decisions. A negative result may occur with  improper specimen collection/handling, submission of specimen other than nasopharyngeal swab, presence of viral mutation(s) within the areas targeted by this assay, and inadequate number of viral copies (<131 copies/mL). A negative result must be combined with clinical observations, patient history, and epidemiological information. The expected result is Negative.  Fact Sheet for Patients:  PinkCheek.be  Fact Sheet for Healthcare Providers:  GravelBags.it  This test is no t yet approved or cleared by the [place] FDA and  has been authorized for detection and/or diagnosis of SARS-CoV-2 by FDA under an Emergency Use Authorization (EUA). This EUA will remain  in effect (meaning this test can be used) for the duration of the COVID-19 declaration under Section 564(b)(1) of the Act, 21 U.S.C. section 360bbb-3(b)(1), unless the authorization is terminated or revoked sooner.     Influenza A by PCR NEGATIVE NEGATIVE Final   Influenza B by PCR NEGATIVE NEGATIVE Final    Comment: (NOTE) The Xpert Xpress SARS-CoV-2/FLU/RSV assay is intended as an aid in  the diagnosis of influenza from Nasopharyngeal swab specimens and  should not be used as a sole basis for treatment. Nasal washings and  aspirates are unacceptable for Xpert Xpress SARS-CoV-2/FLU/RSV  testing.  Fact Sheet for Patients: PinkCheek.be  Fact Sheet for Healthcare  Providers: GravelBags.it  This test is not yet approved or cleared by the [place] FDA and  has been authorized for detection and/or diagnosis of SARS-CoV-2 by  FDA under an Emergency Use Authorization (EUA). This EUA will remain  in effect (meaning this test can be used) for the duration of the  Covid-19 declaration under Section 564(b)(1) of the Act, 21  U.S.C. section 360bbb-3(b)(1), unless the authorization is  terminated or revoked. Performed at [facility], [place] [address] [place], [place] [address]          Radiology Studies: CT ABDOMEN PELVIS W CONTRAST  Result Date: 03/14/2020 CLINICAL DATA:  Squamous cell carcinoma of the anus. EXAM: CT ABDOMEN AND PELVIS WITH CONTRAST TECHNIQUE: Multidetector CT imaging of the abdomen and pelvis was performed using the standard protocol following bolus administration of intravenous contrast. CONTRAST:  164mL OMNIPAQUE IOHEXOL 300 MG/ML  SOLN COMPARISON:  03/01/2020 FINDINGS: Lower chest: Bronchiectasis with parenchymal scarring noted in the lung bases bilaterally. Hepatobiliary: No suspicious focal abnormality within the  liver parenchyma. Gallbladder not visualized in either markedly decompressed or surgically absent. No intrahepatic or extrahepatic biliary dilation. Pancreas: No focal mass lesion. No dilatation of the main duct. No intraparenchymal cyst. No peripancreatic edema. Spleen: No splenomegaly. No focal mass lesion. Adrenals/Urinary Tract: No adrenal nodule or mass. Stable 9 mm low-density lesion upper pole right kidney, incompletely characterized but likely a cyst. Early excretion of contrast material noted in calices of both kidneys. No renal stone disease visible on the exam from 12 days ago. No evidence for hydroureter. 2.6 x 1.3 cm region of apparent focal posterior bladder wall thickening (image 60/series 2) is indeterminate as this finding was not visible on the scan from 12 days  ago. Although today it is highly suspicious for urothelial neoplasm, given that it appears new in the interval, layering debris/blood products is the probable etiology. Nevertheless, close follow-up recommended. Stomach/Bowel: Stomach is unremarkable. No gastric wall thickening. No evidence of outlet obstruction. Duodenum is normally positioned as is the ligament of Treitz. No small bowel wall thickening. No small bowel dilatation. The terminal ileum is normal. The appendix is normal. Wall thickening in the low rectum/anus again noted with heterogeneous enhancement, features compatible with the patient's known neoplasm. Vascular/Lymphatic: There is abdominal aortic atherosclerosis without aneurysm. There is no gastrohepatic or hepatoduodenal ligament lymphadenopathy. No retroperitoneal or mesenteric lymphadenopathy. No pelvic sidewall lymphadenopathy. Reproductive: Calcified fibroid noted in the uterus. There is no adnexal mass. Other: No intraperitoneal free fluid. Musculoskeletal: Soft tissue gas in the pelvic floor compatible with recent CT-guided bilateral pudendal nerve block. No worrisome lytic or sclerotic osseous abnormality. Multilevel compression deformity noted lower thoracic and lumbar spine. IMPRESSION: 1. 2.6 x 1.3 cm region of apparent focal posterior bladder wall thickening is indeterminate as this finding was not visible on the scan from 12 days ago. Although today, it has an appearance suspicious for urothelial neoplasm, given that it was not visible previously, layering debris/blood products is the probable etiology. Nevertheless, close follow-up recommended to ensure that it resolves. 2. No substantial change in the patient's known anal rectal mass. No evidence for metastatic disease in the abdomen or pelvis. 3. Soft tissue gas in the pelvic floor compatible with recent CT-guided bilateral pudendal nerve block. 4. Bronchiectasis with parenchymal scarring in the lung bases bilaterally. 5. Aortic  Atherosclerosis (ICD10-I70.0). Electronically Signed   By: Misty Stanley M.D.   On: 03/14/2020 05:30        Scheduled Meds: . abacavir-dolutegravir-lamiVUDine  1 tablet Oral Daily  . [START ON 03/15/2020] dexamethasone  4 mg Oral Daily   Followed by  . [START ON 03/18/2020] dexamethasone  2 mg Oral Daily  . dexamethasone  6 mg Oral Daily  . DULoxetine  20 mg Oral Daily  . enoxaparin (LOVENOX) injection  40 mg Subcutaneous Daily  . fentaNYL  1 patch Transdermal Q72H  . gabapentin  600 mg Oral BID  . lactose free nutrition  237 mL Oral TID WC  . liver oil-zinc oxide   Topical BID  . polyethylene glycol  17 g Oral Daily  . potassium chloride SA  20 mEq Oral Daily  . senna-docusate  1 tablet Oral BID  . sucralfate  1 g Oral BID   Continuous Infusions:    LOS: 2 days    Time spent: 25 mins,More than 50% of that time was spent in counseling and/or coordination of care.      Shelly Coss, MD [facility] P10/27/2021, 8:24 AM

## 2020-03-15 DIAGNOSIS — C210 Malignant neoplasm of anus, unspecified: Secondary | ICD-10-CM | POA: Diagnosis not present

## 2020-03-15 DIAGNOSIS — R531 Weakness: Secondary | ICD-10-CM

## 2020-03-15 DIAGNOSIS — K6289 Other specified diseases of anus and rectum: Secondary | ICD-10-CM | POA: Diagnosis not present

## 2020-03-15 LAB — CBC WITH DIFFERENTIAL/PLATELET
Abs Immature Granulocytes: 0.03 10*3/uL (ref 0.00–0.07)
Basophils Absolute: 0 10*3/uL (ref 0.0–0.1)
Basophils Relative: 0 %
Eosinophils Absolute: 0 10*3/uL (ref 0.0–0.5)
Eosinophils Relative: 0 %
HCT: 29.6 % — ABNORMAL LOW (ref 36.0–46.0)
Hemoglobin: 9.4 g/dL — ABNORMAL LOW (ref 12.0–15.0)
Immature Granulocytes: 0 %
Lymphocytes Relative: 9 %
Lymphs Abs: 0.7 10*3/uL (ref 0.7–4.0)
MCH: 32.1 pg (ref 26.0–34.0)
MCHC: 31.8 g/dL (ref 30.0–36.0)
MCV: 101 fL — ABNORMAL HIGH (ref 80.0–100.0)
Monocytes Absolute: 0.5 10*3/uL (ref 0.1–1.0)
Monocytes Relative: 7 %
Neutro Abs: 6 10*3/uL (ref 1.7–7.7)
Neutrophils Relative %: 84 %
Platelets: 123 10*3/uL — ABNORMAL LOW (ref 150–400)
RBC: 2.93 MIL/uL — ABNORMAL LOW (ref 3.87–5.11)
RDW: 18.4 % — ABNORMAL HIGH (ref 11.5–15.5)
WBC: 7.2 10*3/uL (ref 4.0–10.5)
nRBC: 0 % (ref 0.0–0.2)

## 2020-03-15 MED ORDER — GLYCERIN (LAXATIVE) 1.2 G RE SUPP
1.0000 | RECTAL | Status: DC | PRN
  Administered 2020-03-15 – 2020-03-17 (×4): 1.2 g via RECTAL
  Filled 2020-03-15 (×8): qty 1

## 2020-03-15 NOTE — Care Management Important Message (Signed)
Important Message  Patient Details IM Letter given to the Patient Name: Vanessa Beard MRN: [id number] Date of Birth: 1951-03-06   Medicare Important Message Given:  Yes     [name] 03/15/2020, 10:00 AM

## 2020-03-15 NOTE — Progress Notes (Signed)
Physical Therapy Treatment Patient Details Name: Vanessa Beard MRN: [id number] DOB: 05/19/1951 Today's Date: 03/15/2020    History of Present Illness 69 year old female with past medical history of hypertension, HIV,  malnutrition and anal squamous cell carcinoma on chemotherapy who previously was admitted on 10/16 for rectal bleeding and pain and found to have a necrotic anal rectal mass.  After discussion with surgery and oncology, patient opted for conservative management, wanting to avoid a palliative diverting colostomy.  She underwent an interventional radiology CT-guided nerve block and palliative care assisted for pain management.  It was recommended she go to a skilled nursing facility, but patient declined this and she was discharged yesterday, 10/24 on fentanyl patch, oral Dilaudid, Neurontin, tapering Decadron and local anesthetics. After discharged home, patient continued to have episodes of pain which were intractable and she came back into the ED  morning of 10/25, complaining of persistent intractable rectal pain.    PT Comments    Pt found in bed today, while attempting to relieve her bowels on the bed pad. She had to be persuaded to move from the bed to the [facility]. Pt struggled with bed mobility secondary to obvious, intense pain; declined assist from therapist. Completed bed mobility, sit to stand, and stand to sit onto toilet with min A from therapist in order to keep the [facility] grounded during transfer. Pt unable to stand in upright position during transfer secondary to pain. Eliminated bowels in bed and on BSC, but not completely. Performs self hygiene with assist from therapist before transferring back bed mod I for increased time due to pain. Pt still eliminating bowels back in bed, and requested to finish before being cleaned again. Nurse was notified.  Follow Up Recommendations  SNF     Equipment Recommendations  None recommended by PT    Recommendations for Other Services  OT consult     Precautions / Restrictions Precautions Precautions: Fall Precaution Comments: oozes stool, very painful rectum    Mobility  Bed Mobility Overal bed mobility: Modified Independent Bed Mobility: Sit to Supine Rolling: Modified independent (Device/Increase time)     Sit to supine: Modified independent (Device/Increase time)   General bed mobility comments: increased to perform due to pain  Transfers Overall transfer level: Modified independent Equipment used: None Transfers: Sit to/from [facility] Sit to Stand: Supervision Standing pivot transfer: min A in order to keep BSC grounded during transfer       General transfer comment: mod I for increased time due to pain  Ambulation/Gait     Assistive device: None           Stairs             Wheelchair Mobility    Modified Rankin (Stroke Patients Only)       Balance                                            Cognition Arousal/Alertness: Awake/alert Behavior During Therapy: WFL for tasks assessed/performed;Restless Overall Cognitive Status: Within Functional Limits for tasks assessed                                        Exercises      General Comments        Pertinent Vitals/Pain Pain  Assessment: Faces Faces Pain Scale: Hurts worst Pain Location: rectum and stomach Pain Descriptors / Indicators: Grimacing;Guarding;Cramping;Moaning Pain Intervention(s): Monitored during session    Home Living                      Prior Function            PT Goals (current goals can now be found in the care plan section)      Frequency    Min 2X/week      PT Plan      Co-evaluation              AM-PAC PT "6 Clicks" Mobility   Outcome Measure  Help needed turning from your back to your side while in a flat bed without using bedrails?: None Help needed moving from lying on your back to sitting on the side of  a flat bed without using bedrails?: None Help needed moving to and from a bed to a chair (including a wheelchair)?: A Little Help needed standing up from a chair using your arms (e.g., wheelchair or bedside chair)?: A Little Help needed to walk in hospital room?: A Lot Help needed climbing 3-5 steps with a railing? : A Lot 6 Click Score: 18    End of Session   Activity Tolerance: Patient limited by pain Patient left: in bed;with call bell/phone within reach;with bed alarm set Nurse Communication: Mobility status PT Visit Diagnosis: Pain;Difficulty in walking, not elsewhere classified (R26.2)     Time: 9758-8325 PT Time Calculation (min) (ACUTE ONLY): 25 min  Charges:  $Therapeutic Activity: 23-37 mins                     [facility] [place], [place] [facility] [phone]

## 2020-03-15 NOTE — TOC Progression Note (Signed)
Transition of Care [facility]) - Progression Note    Patient Details  Name: Vanessa Beard MRN: [id number] Date of Birth: 01/05/1951  [facility]) CM/SW Contact  Drew Herman, Marjie Skiff, RN Phone Number: 03/15/2020, 2:17 PM  Clinical Narrative:    Per MD notes plan is for pt to have diverting colostomy done on Monday 11/1. Liaison from [facility] alerted.   Expected Discharge Plan: [name] Barriers to Discharge: Continued Medical Work up  Expected Discharge Plan and Services Expected Discharge Plan: [place]   Discharge Planning Services: CM Consult Post Acute Care Choice: [place] Living arrangements for the past 2 months: Apartment                                       Social Determinants of Health (SDOH) Interventions    Readmission Risk Interventions Readmission Risk Prevention Plan 03/13/2020 03/09/2020  Transportation Screening Complete Complete  PCP or Specialist Appt within 3-5 Days Complete Complete  HRI or [place] Complete Complete  Social Work Consult for [place] Planning/Counseling Complete Complete  Palliative Care Screening Complete Complete  Medication Review Press photographer) - Complete  Some recent data might be hidden

## 2020-03-15 NOTE — Progress Notes (Signed)
Assessment & Plan: Squamous cell carcinoma of the anus  Patient has discussed diverting colostomy with family and hospitalist and medical oncologist.  She would like to proceed with diverting colostomy and then hospice care.  I offered to proceed with diverting colostomy tomorrow.  Patient refuses and wishes to have surgery on Monday 11/1 when her family can be here.  Will order bowel prep over weekend and tentatively schedule for surgery on Monday 81/4 with Dr. Leighton Ruff who will be the LDOW next week for our service.  The risks and benefits of the procedure have been discussed at length with the patient.  The patient understands the proposed procedure, potential alternative treatments, and the course of recovery to be expected.  All of the patient's questions have been answered at this time.  The patient wishes to proceed with surgery.        Armandina Gemma, MD       [facility]       Office: [phone]   Chief Complaint: Squamous cell carcinoma of the anus  Subjective: Patient in bed, some pain.  Some fecal incontinence.  Objective: Vital signs in last 24 hours: Temp:  [98.2 F (36.8 C)-98.8 F (37.1 C)] 98.2 F (36.8 C) (10/28 0626) Pulse Rate:  [67-77] 72 (10/28 0626) Resp:  [14-16] 14 (10/28 0626) BP: (111-120)/(77-80) 111/77 (10/28 0626) SpO2:  [95 %-97 %] 97 % (10/28 0626) Last BM Date: 03/14/20  Intake/Output from previous day: 10/27 0701 - 10/28 0700 In: 640 [P.O.:640] Out: 500 [Urine:500] Intake/Output this shift: No intake/output data recorded.  Physical Exam: HEENT - sclerae clear, mucous membranes moist Abdomen - soft without distension; well healed surgical scars from lap chole and C-section; no hernia Ext - no edema, non-tender Neuro - alert & oriented, no focal deficits  Lab Results:  Recent Labs    03/15/20 0425  WBC 7.2  HGB 9.4*  HCT 29.6*  PLT 123*   BMET No results for input(s): NA, K, CL, CO2, GLUCOSE, BUN,  CREATININE, CALCIUM in the last 72 hours. PT/INR No results for input(s): LABPROT, INR in the last 72 hours. Comprehensive Metabolic Panel:    Component Value Date/Time   NA 139 03/12/2020 0626   NA 132 (L) 03/05/2020 0541   NA 142 08/31/2013 1515   NA 139 03/01/2013 1524   K 3.7 03/12/2020 0626   K 3.9 03/05/2020 0541   K 3.4 (L) 08/31/2013 1515   K 3.7 03/01/2013 1524   CL 102 03/12/2020 0626   CL 101 03/05/2020 0541   CL 104 07/07/2012 1512   CO2 28 03/12/2020 0626   CO2 24 03/05/2020 0541   CO2 27 08/31/2013 1515   CO2 25 03/01/2013 1524   BUN 20 03/12/2020 0626   BUN 9 03/05/2020 0541   BUN 14.7 08/31/2013 1515   BUN 11.6 03/01/2013 1524   CREATININE 0.61 03/12/2020 0626   CREATININE 0.59 03/05/2020 0541   CREATININE 0.82 02/17/2020 0824   CREATININE 0.81 01/27/2020 0721   CREATININE 0.87 08/08/2019 1535   CREATININE 1.04 (H) 02/07/2019 1528   CREATININE 0.8 08/31/2013 1515   CREATININE 0.8 03/01/2013 1524   GLUCOSE 102 (H) 03/12/2020 0626   GLUCOSE 87 03/05/2020 0541   GLUCOSE 69 (L) 08/31/2013 1515   GLUCOSE 100 03/01/2013 1524   GLUCOSE 88 07/07/2012 1512   CALCIUM 9.7 03/12/2020 0626   CALCIUM 9.4 03/05/2020 0541   CALCIUM 9.6 08/31/2013 1515   CALCIUM 9.3 03/01/2013 1524  AST 23 03/03/2020 1545   AST 24 03/01/2020 1208   AST 22 02/17/2020 0824   AST 19 01/27/2020 0721   AST 25 08/31/2013 1515   AST 36 (H) 03/01/2013 1524   ALT 12 03/03/2020 1545   ALT 11 03/01/2020 1208   ALT 8 02/17/2020 0824   ALT <6 01/27/2020 0721   ALT 9 08/31/2013 1515   ALT 21 03/01/2013 1524   ALKPHOS 73 03/03/2020 1545   ALKPHOS 79 03/01/2020 1208   ALKPHOS 99 08/31/2013 1515   ALKPHOS 85 03/01/2013 1524   BILITOT 0.7 03/03/2020 1545   BILITOT 1.1 03/01/2020 1208   BILITOT 0.7 02/17/2020 0824   BILITOT 0.7 01/27/2020 0721   BILITOT 0.67 08/31/2013 1515   BILITOT 0.64 03/01/2013 1524   PROT 7.5 03/03/2020 1545   PROT 7.4 03/01/2020 1208   PROT 8.8 (H) 08/31/2013  1515   PROT 9.3 (H) 03/01/2013 1524   ALBUMIN 2.6 (L) 03/03/2020 1545   ALBUMIN 2.5 (L) 03/01/2020 1208   ALBUMIN 3.7 08/31/2013 1515   ALBUMIN 3.4 (L) 03/01/2013 1524    Studies/Results: CT ABDOMEN PELVIS W CONTRAST  Result Date: 03/14/2020 CLINICAL DATA:  Squamous cell carcinoma of the anus. EXAM: CT ABDOMEN AND PELVIS WITH CONTRAST TECHNIQUE: Multidetector CT imaging of the abdomen and pelvis was performed using the standard protocol following bolus administration of intravenous contrast. CONTRAST:  174mL OMNIPAQUE IOHEXOL 300 MG/ML  SOLN COMPARISON:  03/01/2020 FINDINGS: Lower chest: Bronchiectasis with parenchymal scarring noted in the lung bases bilaterally. Hepatobiliary: No suspicious focal abnormality within the liver parenchyma. Gallbladder not visualized in either markedly decompressed or surgically absent. No intrahepatic or extrahepatic biliary dilation. Pancreas: No focal mass lesion. No dilatation of the main duct. No intraparenchymal cyst. No peripancreatic edema. Spleen: No splenomegaly. No focal mass lesion. Adrenals/Urinary Tract: No adrenal nodule or mass. Stable 9 mm low-density lesion upper pole right kidney, incompletely characterized but likely a cyst. Early excretion of contrast material noted in calices of both kidneys. No renal stone disease visible on the exam from 12 days ago. No evidence for hydroureter. 2.6 x 1.3 cm region of apparent focal posterior bladder wall thickening (image 60/series 2) is indeterminate as this finding was not visible on the scan from 12 days ago. Although today it is highly suspicious for urothelial neoplasm, given that it appears new in the interval, layering debris/blood products is the probable etiology. Nevertheless, close follow-up recommended. Stomach/Bowel: Stomach is unremarkable. No gastric wall thickening. No evidence of outlet obstruction. Duodenum is normally positioned as is the ligament of Treitz. No small bowel wall thickening. No  small bowel dilatation. The terminal ileum is normal. The appendix is normal. Wall thickening in the low rectum/anus again noted with heterogeneous enhancement, features compatible with the patient's known neoplasm. Vascular/Lymphatic: There is abdominal aortic atherosclerosis without aneurysm. There is no gastrohepatic or hepatoduodenal ligament lymphadenopathy. No retroperitoneal or mesenteric lymphadenopathy. No pelvic sidewall lymphadenopathy. Reproductive: Calcified fibroid noted in the uterus. There is no adnexal mass. Other: No intraperitoneal free fluid. Musculoskeletal: Soft tissue gas in the pelvic floor compatible with recent CT-guided bilateral pudendal nerve block. No worrisome lytic or sclerotic osseous abnormality. Multilevel compression deformity noted lower thoracic and lumbar spine. IMPRESSION: 1. 2.6 x 1.3 cm region of apparent focal posterior bladder wall thickening is indeterminate as this finding was not visible on the scan from 12 days ago. Although today, it has an appearance suspicious for urothelial neoplasm, given that it was not visible previously, layering debris/blood products is  the probable etiology. Nevertheless, close follow-up recommended to ensure that it resolves. 2. No substantial change in the patient's known anal rectal mass. No evidence for metastatic disease in the abdomen or pelvis. 3. Soft tissue gas in the pelvic floor compatible with recent CT-guided bilateral pudendal nerve block. 4. Bronchiectasis with parenchymal scarring in the lung bases bilaterally. 5. Aortic Atherosclerosis (ICD10-I70.0). Electronically Signed   By: Misty Stanley M.D.   On: 03/14/2020 05:30      Armandina Gemma 03/15/2020  Patient ID: Vanessa Beard, female   DOB: April 09, 1951, 68 y.o.   MRN: [id number]

## 2020-03-15 NOTE — Progress Notes (Signed)
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  General surgery and oncology were following.  Palliative care consulted for pain management and IR did CT-guided nerve block.  Hospital course was remarkable for significant pain requiring IV pain medications and prolonged hospital stay. Admitted this time for severe anal pain.  Oncology following.Plan for diverting colostomy as per general surgery on Monday.  Assessment & Plan:   Principal Problem:   Rectal pain Active Problems:   Human immunodeficiency virus (HIV) disease (H[facility])   Chronic hepatitis C virus infection ([place])   Essential hypertension   Anal cancer ([place])   Protein-calorie malnutrition, severe   Palliative care by specialist   General weakness  Anal squamous cell carcinoma with necrotic anorectal mass: She was following with oncology and was on Keytruda.  She presented with rectal pain, bleeding.  Found to have local progression of disease on last admission.  Continue pain management and supportive care.  We also requested palliative care evaluation for pain management. IR did CT-guided nerve block on 10.19.21. Goal is  to control her pain better. Continue bowel regimen to avoid constipation. Continue current pain management regimen: She is on fentanyl patch, oral Dilaudid, gabapentin, Decadron Abdominal/pelvis CT ordered during this admission did not show any significant change in the and anal mass.  General surgery again consulted and given option for diverting colostomy  and patient has  decided and agreed after she discussed with family.  Plan for Monday.  She does not want to continue any further chemotherapy and is open to home hospice after surgery.  Normocytic anemia: From chronic blood loss from anal cancer.  Currently hemoglobin stable.  HIV: CD4 of 76.  Not currently on PJP prophylaxis due to intolerance to Bactrim, dapsone.  Follows with ID.    Hypertension: Blood pressure medicines were discontinued on last admission.  Severe protein calorie malnutrition: Nutrition consulted  Debility/deconditioning: PT recommended SNF vs HH on last admission.  Patient expressed  interested on skilled nursing facility at this time but now is open for home with hospice ,we will discuss further about this after surgery.  Nutrition Problem: Severe Malnutrition Etiology: chronic illness, cancer and cancer related treatments      DVT prophylaxis:SCD Code Status: DnR Family Communication: Discussed with daughter on phone on 03/10/20 Status is: Inpatient  Remains inpatient appropriate because:Inpatient level of care appropriate due to severity of illness   Dispo: The patient is from: Home              Anticipated d/c is to:SnF vs Home with Hospice              Anticipated d/c date is: Not sure at this point              Patient currently is not medically stable to d/c. Patient awaiting for diverting colostomy   Consultants: Oncology, surgery  Procedures: None  Antimicrobials:  Anti-infectives (From admission, onward)   Start     Dose/Rate Route Frequency Ordered Stop   03/12/20 1000  abacavir-dolutegravir-lamiVUDine (TRIUMEQ) 174-08-144 MG per tablet  1 tablet        1 tablet Oral Daily 03/12/20 0914        Subjective:  Patient seen and examined at the bedside this morning.  Hemodynamically stable.  She is very comfortable today.  Denies any significant anal pain.  Objective: Vitals:   03/14/20 0424 03/14/20 1407 03/14/20 2008 03/15/20 0626  BP:  126/84 114/79 120/80 111/77  Pulse: 65 77 67 72  Resp: 18 16 16 14   Temp: 98.9 F (37.2 C) 98.3 F (36.8 C) 98.8 F (37.1 C) 98.2 F (36.8 C)  TempSrc: Oral Oral Oral Oral  SpO2: 94% 95% 97% 97%  Weight:      Height:        Intake/Output Summary (Last 24 hours) at 03/15/2020 0845 Last data filed at 03/15/2020 4315 Gross per 24 hour  Intake 640 ml  Output 500 ml  Net 140 ml   Filed Weights   03/12/20 0550  Weight: 58.5 kg    Examination:   General exam: Overall comfortable, chronically ill looking HEENT:PERRL,Oral mucosa moist, Ear/Nose normal on gross exam Respiratory system: Bilateral equal air entry, normal vesicular breath sounds, no wheezes or crackles  Cardiovascular system: S1 & S2 heard, RRR. No JVD, murmurs, rubs, gallops or clicks.Chemo prot on right chest Gastrointestinal system: Abdomen is nondistended, soft and nontender. No organomegaly or masses felt. Normal bowel sounds heard. Central nervous system: Alert and oriented. No focal neurological deficits. Extremities: No edema, no clubbing ,no cyanosis Skin: No rashes, lesions or ulcers,no icterus ,no pallor   Data Reviewed: I have personally reviewed following labs and imaging studies  CBC: Recent Labs  Lab 03/12/20 0626 03/15/20 0425  WBC 6.8 7.2  NEUTROABS 5.8 6.0  HGB 9.1* 9.4*  HCT 28.6* 29.6*  MCV 100.0 101.0*  PLT 144* 400*   Basic Metabolic Panel: Recent Labs  Lab 03/12/20 0626  NA 139  K 3.7  CL 102  CO2 28  GLUCOSE 102*  BUN 20  CREATININE 0.61  CALCIUM 9.7   GFR: Estimated Creatinine Clearance: 51.6 mL/min (by C-G formula based on SCr of 0.61 mg/dL). Liver Function Tests: No results for input(s): AST, ALT, ALKPHOS, BILITOT, PROT, ALBUMIN in the last 168 hours. No results for input(s): LIPASE, AMYLASE in the last 168 hours. No results for input(s): AMMONIA in the last 168 hours. Coagulation Profile: No results for input(s): INR, PROTIME in the last 168 hours. Cardiac  Enzymes: No results for input(s): CKTOTAL, CKMB, CKMBINDEX, TROPONINI in the last 168 hours. BNP (last 3 results) No results for input(s): PROBNP in the last 8760 hours. HbA1C: No results for input(s): HGBA1C in the last 72 hours. CBG: No results for input(s): GLUCAP in the last 168 hours. Lipid Profile: No results for input(s): CHOL, HDL, LDLCALC, TRIG, CHOLHDL, LDLDIRECT in the last 72 hours. Thyroid Function Tests: No results for input(s): TSH, T4TOTAL, FREET4, T3FREE, THYROIDAB in the last 72 hours. Anemia Panel: No results for input(s): VITAMINB12, FOLATE, FERRITIN, TIBC, IRON, RETICCTPCT in the last 72 hours. Sepsis Labs: No results for input(s): PROCALCITON, LATICACIDVEN in the last 168 hours.  Recent Results (from the past 240 hour(s))  Surgical PCR screen     Status: None   Collection Time: 03/08/20 10:14 PM   Specimen: Nasal Mucosa; Nasal Swab  Result Value Ref Range Status   MRSA, PCR NEGATIVE NEGATIVE Final   Staphylococcus aureus NEGATIVE NEGATIVE Final    Comment: (NOTE) The Xpert SA Assay (FDA approved for NASAL specimens in patients 22  years of age and older), is one component of a comprehensive surveillance program. It is not intended to diagnose infection nor to guide or monitor treatment. Performed at [facility], [place] [address], [place], [place] [address]   Respiratory Panel by RT PCR (Flu A&B, Covid) - Nasopharyngeal Swab     Status: None   Collection Time: 03/12/20  7:17 AM   Specimen: Nasopharyngeal Swab  Result Value Ref Range Status   SARS Coronavirus 2 by RT PCR NEGATIVE NEGATIVE Final    Comment: (NOTE) SARS-CoV-2 target nucleic acids are NOT DETECTED.  The SARS-CoV-2 RNA is generally detectable in upper respiratoy specimens during the acute phase of infection. The lowest concentration of SARS-CoV-2 viral copies this assay can detect is 131 copies/mL. A negative result does not preclude SARS-Cov-2 infection and should not be  used as the sole basis for treatment or other patient management decisions. A negative result may occur with  improper specimen collection/handling, submission of specimen other than nasopharyngeal swab, presence of viral mutation(s) within the areas targeted by this assay, and inadequate number of viral copies (<131 copies/mL). A negative result must be combined with clinical observations, patient history, and epidemiological information. The expected result is Negative.  Fact Sheet for Patients:  PinkCheek.be  Fact Sheet for Healthcare Providers:  GravelBags.it  This test is no t yet approved or cleared by the [place] FDA and  has been authorized for detection and/or diagnosis of SARS-CoV-2 by FDA under an Emergency Use Authorization (EUA). This EUA will remain  in effect (meaning this test can be used) for the duration of the COVID-19 declaration under Section 564(b)(1) of the Act, 21 U.S.C. section 360bbb-3(b)(1), unless the authorization is terminated or revoked sooner.     Influenza A by PCR NEGATIVE NEGATIVE Final   Influenza B by PCR NEGATIVE NEGATIVE Final    Comment: (NOTE) The Xpert Xpress SARS-CoV-2/FLU/RSV assay is intended as an aid in  the diagnosis of influenza from Nasopharyngeal swab specimens and  should not be used as a sole basis for treatment. Nasal washings and  aspirates are unacceptable for Xpert Xpress SARS-CoV-2/FLU/RSV  testing.  Fact Sheet for Patients: PinkCheek.be  Fact Sheet for Healthcare Providers: GravelBags.it  This test is not yet approved or cleared by the [place] FDA and  has been authorized for detection and/or diagnosis of SARS-CoV-2 by  FDA under an Emergency Use Authorization (EUA). This EUA will remain  in effect (meaning this test can be used) for the duration of the  Covid-19 declaration under Section  564(b)(1) of the Act, 21  U.S.C. section 360bbb-3(b)(1), unless the authorization is  terminated or revoked. Performed at [facility], [place] [address] [place], [place] [address]          Radiology Studies: CT ABDOMEN PELVIS W CONTRAST  Result Date: 03/14/2020 CLINICAL DATA:  Squamous cell carcinoma of the anus. EXAM: CT ABDOMEN AND PELVIS WITH CONTRAST TECHNIQUE: Multidetector CT imaging of the abdomen and pelvis was performed using the standard protocol following bolus administration of intravenous contrast. CONTRAST:  122mL OMNIPAQUE IOHEXOL 300 MG/ML  SOLN COMPARISON:  03/01/2020 FINDINGS: Lower chest: Bronchiectasis with parenchymal scarring noted in the lung bases bilaterally. Hepatobiliary: No suspicious focal abnormality within the liver parenchyma. Gallbladder not visualized in either markedly decompressed or surgically absent. No intrahepatic or extrahepatic biliary dilation. Pancreas: No focal mass lesion. No dilatation of the main duct. No intraparenchymal cyst. No peripancreatic edema. Spleen: No splenomegaly. No focal mass lesion.  Adrenals/Urinary Tract: No adrenal nodule or mass. Stable 9 mm low-density lesion upper pole right kidney, incompletely characterized but likely a cyst. Early excretion of contrast material noted in calices of both kidneys. No renal stone disease visible on the exam from 12 days ago. No evidence for hydroureter. 2.6 x 1.3 cm region of apparent focal posterior bladder wall thickening (image 60/series 2) is indeterminate as this finding was not visible on the scan from 12 days ago. Although today it is highly suspicious for urothelial neoplasm, given that it appears new in the interval, layering debris/blood products is the probable etiology. Nevertheless, close follow-up recommended. Stomach/Bowel: Stomach is unremarkable. No gastric wall thickening. No evidence of outlet obstruction. Duodenum is normally positioned as is the ligament of  Treitz. No small bowel wall thickening. No small bowel dilatation. The terminal ileum is normal. The appendix is normal. Wall thickening in the low rectum/anus again noted with heterogeneous enhancement, features compatible with the patient's known neoplasm. Vascular/Lymphatic: There is abdominal aortic atherosclerosis without aneurysm. There is no gastrohepatic or hepatoduodenal ligament lymphadenopathy. No retroperitoneal or mesenteric lymphadenopathy. No pelvic sidewall lymphadenopathy. Reproductive: Calcified fibroid noted in the uterus. There is no adnexal mass. Other: No intraperitoneal free fluid. Musculoskeletal: Soft tissue gas in the pelvic floor compatible with recent CT-guided bilateral pudendal nerve block. No worrisome lytic or sclerotic osseous abnormality. Multilevel compression deformity noted lower thoracic and lumbar spine. IMPRESSION: 1. 2.6 x 1.3 cm region of apparent focal posterior bladder wall thickening is indeterminate as this finding was not visible on the scan from 12 days ago. Although today, it has an appearance suspicious for urothelial neoplasm, given that it was not visible previously, layering debris/blood products is the probable etiology. Nevertheless, close follow-up recommended to ensure that it resolves. 2. No substantial change in the patient's known anal rectal mass. No evidence for metastatic disease in the abdomen or pelvis. 3. Soft tissue gas in the pelvic floor compatible with recent CT-guided bilateral pudendal nerve block. 4. Bronchiectasis with parenchymal scarring in the lung bases bilaterally. 5. Aortic Atherosclerosis (ICD10-I70.0). Electronically Signed   By: Misty Stanley M.D.   On: 03/14/2020 05:30        Scheduled Meds: . abacavir-dolutegravir-lamiVUDine  1 tablet Oral Daily  . dexamethasone  4 mg Oral Daily   Followed by  . [START ON 03/18/2020] dexamethasone  2 mg Oral Daily  . DULoxetine  20 mg Oral Daily  . enoxaparin (LOVENOX) injection  40 mg  Subcutaneous Daily  . fentaNYL  1 patch Transdermal Q72H  . gabapentin  600 mg Oral BID  . lactose free nutrition  237 mL Oral TID WC  . liver oil-zinc oxide   Topical BID  . polyethylene glycol  17 g Oral Daily  . potassium chloride SA  20 mEq Oral Daily  . senna-docusate  1 tablet Oral BID  . sucralfate  1 g Oral BID   Continuous Infusions:    LOS: 3 days    Time spent: 25 mins,More than 50% of that time was spent in counseling and/or coordination of care.      Shelly Coss, MD [facility] P10/28/2021, 8:45 AM

## 2020-03-15 NOTE — Progress Notes (Signed)
Daily Progress Note   Patient Name: Vanessa Beard       Date: 03/15/2020 DOB: Jan 27, 1951  Age: 69 y.o. MRN#: [id number] Attending Physician: Shelly Coss, MD Primary Care Physician: Seward Carol, MD Admit Date: 03/12/2020  Reason for Consultation/Follow-up: Establishing goals of care  Subjective: Patient is awake alert resting in bed, she complains of pain in her anal area, she had a bowel movement earlier this morning, she is tolerating PO, she has required total 7 mg IV Dilaudid and one dose of 2 mg PO Dilaudid in the past 24 hours, remains on trans dermal Fentanyl as well.  She states that her daughter is currently away on a cruise, she is going to have surgery on Monday.     Length of Stay: 3  Current Medications: Scheduled Meds:  . abacavir-dolutegravir-lamiVUDine  1 tablet Oral Daily  . dexamethasone  4 mg Oral Daily   Followed by  . [START ON 03/18/2020] dexamethasone  2 mg Oral Daily  . DULoxetine  20 mg Oral Daily  . enoxaparin (LOVENOX) injection  40 mg Subcutaneous Daily  . fentaNYL  1 patch Transdermal Q72H  . gabapentin  600 mg Oral BID  . lactose free nutrition  237 mL Oral TID WC  . liver oil-zinc oxide   Topical BID  . polyethylene glycol  17 g Oral Daily  . potassium chloride SA  20 mEq Oral Daily  . senna-docusate  1 tablet Oral BID  . sucralfate  1 g Oral BID    Continuous Infusions:   PRN Meds: belladonna-opium, butamben-tetracaine-benzocaine, glycerin (Pediatric), HYDROmorphone (DILAUDID) injection, HYDROmorphone, zolpidem  Physical Exam         No distress Awake alert Regular work of breathing Abdomen not distended Non focal No edema S1 S 2   Vital Signs: BP 111/77 (BP Location: Left Arm)   Pulse 72   Temp 98.2 F (36.8 C) (Oral)    Resp 14   Ht 5' (1.524 m)   Wt 58.5 kg   SpO2 97%   BMI 25.19 kg/m  SpO2: SpO2: 97 % O2 Device: O2 Device: Room Air O2 Flow Rate:    Intake/output summary:   Intake/Output Summary (Last 24 hours) at 03/15/2020 1048 Last data filed at 03/15/2020 8864 Gross per 24 hour  Intake 420 ml  Output 500 ml  Net -80 ml   LBM: Last BM Date: 03/14/20 Baseline Weight: Weight: 58.5 kg Most recent weight: Weight: 58.5 kg      PPS 50% Palliative Assessment/Data:      Patient Active Problem List   Diagnosis Date Noted  . Palliative care by specialist   . General weakness   . Protein-calorie malnutrition, severe 03/05/2020  . Palliative care encounter 01/27/2020  . Goals of care, counseling/discussion 01/16/2020  . Hemorrhoid 12/16/2019  . Rectal pain 12/16/2019  . Anal cancer ([place]) 07/26/2019  . Rectal mass 07/14/2019  . Constipation 07/14/2019  . Anal fissure 07/14/2019  . Healthcare maintenance 06/30/2018  . Left knee pain 12/30/2017  . Left shoulder pain 12/30/2017  . Closed fracture of right tibial plateau 02/18/2017  . Benign hypertension 02/15/2017  . Screening examination for venereal disease 03/06/2015  . Encounter for long-term (current) use of medications 03/06/2015  . Elevated LFTs 12/11/2014  . Abnormal ultrasound 12/11/2014  . Abdominal pain, epigastric 12/11/2014  . ASCUS of cervix with negative high risk HPV 10/31/2013  . Gall bladder disease 02/03/2013  . 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  . Compression fracture of L3 lumbar vertebra 07/01/2011  . Thyroid dysfunction 07/01/2011  . Anemia 07/01/2011  . Sore throat 04/02/2011  . Hyperparathyroidism 02/10/2011  . Osteopenia 02/03/2011  . PAP SMEAR, LGSIL, ABNORMAL 12/21/2009  . COUGH 07/18/2009  . Chronic hepatitis C virus infection ([place]) 12/19/2008  . LEUKORRHEA 04/07/2008  . FATIGUE 04/07/2008  . BACK PAIN, LUMBAR 11/10/2007  . ALLERGIC RHINITIS 07/09/2007  . PNEUMOCYSTIS  PNEUMONIA 05/07/2007  . Essential hypertension 05/07/2007  . DENTAL CARIES 05/07/2007  . INSOMNIA 05/07/2007  . PNEUMONIA, HX OF 05/07/2007  . HERPES ZOSTER, HX OF 05/07/2007  . Human immunodeficiency virus (HIV) disease ([place]) 08/04/2006    Palliative Care Assessment & Plan   Patient Profile:    Assessment:  squamous cell ca anus Severe pain Necrotic anorectal mass Has history of HIV HTN  Recommendations/Plan:  patient states she is considering diverting colostomy. Med onc also following, palliative following for pain management.  Continue current mode of care.  Try small Glycerin Suppository today, patient feels severe pain and feels like she has retained stool near her anal area.   Goals of Care and Additional Recommendations: Limitations on Scope of Treatment: Full Scope Treatment  Code Status:    Code Status Orders  (From admission, onward)         Start     Ordered   03/12/20 0940  Do not attempt resuscitation (DNR)  Continuous       Question Answer Comment  In the event of cardiac or respiratory ARREST Do not call a "code blue"   In the event of cardiac or respiratory ARREST Do not perform Intubation, CPR, defibrillation or ACLS   In the event of cardiac or respiratory ARREST Use medication by any route, position, wound care, and other measures to relive pain and suffering. May use oxygen, suction and manual treatment of airway obstruction as needed for comfort.      03/12/20 0939        Code Status History    Date Active Date Inactive Code Status Order ID Comments User Context   03/03/2020 1955 03/11/2020 2230 DNR [id number]  Lenore Cordia, MD ED   02/15/2017 2327 02/21/2017 1728 Full Code [id number]  Jani Gravel, MD Inpatient   Advance Care Planning Activity      Prognosis:  Unable to determine  Discharge Planning: To  Be Determined  Care plan was discussed with  Patient and RN.   Thank you for allowing the Palliative Medicine Team to assist in the  care of this patient.   Time In: 10 Time Out: 10.25 Total Time 25 Prolonged Time Billed  no       Greater than 50%  of this time was spent counseling and coordinating care related to the above assessment and plan.  Loistine Chance, MD  Please contact Palliative Medicine Team phone at [phone] for questions and concerns.

## 2020-03-16 DIAGNOSIS — K6289 Other specified diseases of anus and rectum: Secondary | ICD-10-CM | POA: Diagnosis not present

## 2020-03-16 NOTE — Progress Notes (Signed)
OT Cancellation Note  Patient Details Name: Vanessa Beard MRN: [id number] DOB: 06-17-1950   Cancelled Treatment:    Reason Eval/Treat Not Completed: Pain limiting ability to participate  Kimon Loewen L Caytlyn Evers 03/16/2020, 2:50 PM

## 2020-03-16 NOTE — Progress Notes (Signed)
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  General surgery and oncology were following.  Palliative care consulted for pain management and IR did CT-guided nerve block.  Hospital course was remarkable for significant pain requiring IV pain medications and prolonged hospital stay. Admitted this time for severe anal pain.  Oncology following.Plan for diverting colostomy as per general surgery on Monday.  03/16/2020: Patient seen.  Above records reviewed.  For surgery on Monday.  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  She presented with rectal pain, bleeding.  Found to have local progression of disease on last admission.  Continue pain management and supportive care.  We also requested palliative care evaluation for pain management. IR did CT-guided nerve block on 10.19.21. Goal is  to control her pain better. Continue bowel regimen to avoid constipation. Continue current pain management regimen: She is on fentanyl patch, oral Dilaudid, gabapentin, Decadron Abdominal/pelvis CT ordered during this admission did not show any significant change in the and anal  mass.  General surgery again consulted and given option for diverting colostomy and patient has  decided and agreed after she discussed with family.  Plan for Monday.  She does not want to continue any further chemotherapy and is open to home hospice after surgery. 03/16/2020: Optimize pain control.  Normocytic anemia: From chronic blood loss from anal cancer.  Currently hemoglobin stable.  HIV: CD4 of 76.  Not currently on PJP prophylaxis due to intolerance to Bactrim, dapsone.  Follows with ID.    Hypertension: Blood pressure medicines were discontinued on last admission.  Severe protein calorie malnutrition: Nutrition consulted  Debility/deconditioning: PT recommended SNF vs HH on last admission.  Patient expressed  interested on skilled nursing facility at this time but now is open for home with hospice ,we will discuss further about this after surgery.  Nutrition Problem: Severe Malnutrition Etiology: chronic illness, cancer and cancer related treatments      DVT prophylaxis:SCD Code Status: DnR Family Communication: Discussed with daughter on phone on 03/10/20 Status is: Inpatient  Remains inpatient appropriate because:Inpatient level of care appropriate due to severity of illness   Dispo: The patient is from: Home              Anticipated d/c is to:SnF vs Home with Hospice              Anticipated d/c date is: Not sure at this point              Patient currently is not medically stable to d/c. Patient awaiting for diverting colostomy   Consultants: Oncology, surgery  Procedures: None  Antimicrobials:  Anti-infectives (From admission, onward)   Start  Dose/Rate Route Frequency Ordered Stop   03/12/20 1000  abacavir-dolutegravir-lamiVUDine (TRIUMEQ) 376-28-315 MG per tablet 1 tablet        1 tablet Oral Daily 03/12/20 0914        Subjective: No new complaint. Anal pain persists.  Objective: Vitals:   03/15/20 1236 03/15/20 2038 03/16/20 0504 03/16/20 1541   BP: (!) 98/55 105/69 106/76 100/72  Pulse: 71 100 85 80  Resp: 18 16 16 18   Temp: 98.1 F (36.7 C) 99.7 F (37.6 C) (!) 100.5 F (38.1 C) 98.5 F (36.9 C)  TempSrc: Oral Oral Oral Oral  SpO2: 97% 94% 98% 95%  Weight:      Height:        Intake/Output Summary (Last 24 hours) at 03/16/2020 1813 Last data filed at 03/16/2020 0800 Gross per 24 hour  Intake 360 ml  Output --  Net 360 ml   Filed Weights   03/12/20 0550  Weight: 58.5 kg    Examination:   General exam: Chronically ill looking.  Cachectic.  G HEENT: Pallor. Respiratory system: Clear to auscultations.   Cardiovascular system: S1 & S2 heard Gastrointestinal system: Abdomen is nondistended, soft and nontender. No organomegaly or masses felt. Normal bowel sounds heard. Central nervous system: Awake and alert.   Extremities: No edema.  Data Reviewed: I have personally reviewed following labs and imaging studies  CBC: Recent Labs  Lab 03/12/20 0626 03/15/20 0425  WBC 6.8 7.2  NEUTROABS 5.8 6.0  HGB 9.1* 9.4*  HCT 28.6* 29.6*  MCV 100.0 101.0*  PLT 144* 176*   Basic Metabolic Panel: Recent Labs  Lab 03/12/20 0626  NA 139  K 3.7  CL 102  CO2 28  GLUCOSE 102*  BUN 20  CREATININE 0.61  CALCIUM 9.7   GFR: Estimated Creatinine Clearance: 51.6 mL/min (by C-G formula based on SCr of 0.61 mg/dL). Liver Function Tests: No results for input(s): AST, ALT, ALKPHOS, BILITOT, PROT, ALBUMIN in the last 168 hours. No results for input(s): LIPASE, AMYLASE in the last 168 hours. No results for input(s): AMMONIA in the last 168 hours. Coagulation Profile: No results for input(s): INR, PROTIME in the last 168 hours. Cardiac Enzymes: No results for input(s): CKTOTAL, CKMB, CKMBINDEX, TROPONINI in the last 168 hours. BNP (last 3 results) No results for input(s): PROBNP in the last 8760 hours. HbA1C: No results for input(s): HGBA1C in the last 72 hours. CBG: No results for input(s): GLUCAP in the last 168  hours. Lipid Profile: No results for input(s): CHOL, HDL, LDLCALC, TRIG, CHOLHDL, LDLDIRECT in the last 72 hours. Thyroid Function Tests: No results for input(s): TSH, T4TOTAL, FREET4, T3FREE, THYROIDAB in the last 72 hours. Anemia Panel: No results for input(s): VITAMINB12, FOLATE, FERRITIN, TIBC, IRON, RETICCTPCT in the last 72 hours. Sepsis Labs: No results for input(s): PROCALCITON, LATICACIDVEN in the last 168 hours.  Recent Results (from the past 240 hour(s))  Surgical PCR screen     Status: None   Collection Time: 03/08/20 10:14 PM   Specimen: Nasal Mucosa; Nasal Swab  Result Value Ref Range Status   MRSA, PCR NEGATIVE NEGATIVE Final   Staphylococcus aureus NEGATIVE NEGATIVE Final    Comment: (NOTE) The Xpert SA Assay (FDA approved for NASAL specimens in patients 11 years of age and older), is one component of a comprehensive surveillance program. It is not intended to diagnose infection nor to guide or monitor treatment. Performed at [facility], [place] [address] [place], [place] [address]   Respiratory  Panel by RT PCR (Flu A&B, Covid) - Nasopharyngeal Swab     Status: None   Collection Time: 03/12/20  7:17 AM   Specimen: Nasopharyngeal Swab  Result Value Ref Range Status   SARS Coronavirus 2 by RT PCR NEGATIVE NEGATIVE Final    Comment: (NOTE) SARS-CoV-2 target nucleic acids are NOT DETECTED.  The SARS-CoV-2 RNA is generally detectable in upper respiratoy specimens during the acute phase of infection. The lowest concentration of SARS-CoV-2 viral copies this assay can detect is 131 copies/mL. A negative result does not preclude SARS-Cov-2 infection and should not be used as the sole basis for treatment or other patient management decisions. A negative result may occur with  improper specimen collection/handling, submission of specimen other than nasopharyngeal swab, presence of viral mutation(s) within the areas targeted by this assay, and  inadequate number of viral copies (<131 copies/mL). A negative result must be combined with clinical observations, patient history, and epidemiological information. The expected result is Negative.  Fact Sheet for Patients:  PinkCheek.be  Fact Sheet for Healthcare Providers:  GravelBags.it  This test is no t yet approved or cleared by the [place] FDA and  has been authorized for detection and/or diagnosis of SARS-CoV-2 by FDA under an Emergency Use Authorization (EUA). This EUA will remain  in effect (meaning this test can be used) for the duration of the COVID-19 declaration under Section 564(b)(1) of the Act, 21 U.S.C. section 360bbb-3(b)(1), unless the authorization is terminated or revoked sooner.     Influenza A by PCR NEGATIVE NEGATIVE Final   Influenza B by PCR NEGATIVE NEGATIVE Final    Comment: (NOTE) The Xpert Xpress SARS-CoV-2/FLU/RSV assay is intended as an aid in  the diagnosis of influenza from Nasopharyngeal swab specimens and  should not be used as a sole basis for treatment. Nasal washings and  aspirates are unacceptable for Xpert Xpress SARS-CoV-2/FLU/RSV  testing.  Fact Sheet for Patients: PinkCheek.be  Fact Sheet for Healthcare Providers: GravelBags.it  This test is not yet approved or cleared by the [place] FDA and  has been authorized for detection and/or diagnosis of SARS-CoV-2 by  FDA under an Emergency Use Authorization (EUA). This EUA will remain  in effect (meaning this test can be used) for the duration of the  Covid-19 declaration under Section 564(b)(1) of the Act, 21  U.S.C. section 360bbb-3(b)(1), unless the authorization is  terminated or revoked. Performed at [facility], [place] [address], [place], [place] [address]          Radiology Studies: No results found.      Scheduled Meds: .  abacavir-dolutegravir-lamiVUDine  1 tablet Oral Daily  . dexamethasone  4 mg Oral Daily   Followed by  . [START ON 03/18/2020] dexamethasone  2 mg Oral Daily  . DULoxetine  20 mg Oral Daily  . enoxaparin (LOVENOX) injection  40 mg Subcutaneous Daily  . fentaNYL  1 patch Transdermal Q72H  . gabapentin  600 mg Oral BID  . lactose free nutrition  237 mL Oral TID WC  . liver oil-zinc oxide   Topical BID  . polyethylene glycol  17 g Oral Daily  . potassium chloride SA  20 mEq Oral Daily  . senna-docusate  1 tablet Oral BID  . sucralfate  1 g Oral BID   Continuous Infusions:    LOS: 4 days    Time spent: 25 mins.     Bonnell Public, MD [facility] P10/29/2021, 6:13 PM

## 2020-03-17 DIAGNOSIS — K6289 Other specified diseases of anus and rectum: Secondary | ICD-10-CM | POA: Diagnosis not present

## 2020-03-17 NOTE — Progress Notes (Signed)
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  General surgery and oncology were following.  Palliative care consulted for pain management and IR did CT-guided nerve block.  Hospital course was remarkable for significant pain requiring IV pain medications and prolonged hospital stay. Admitted this time for severe anal pain.  Oncology following.Plan for diverting colostomy as per general surgery on Monday.  03/17/2020: Patient seen.  No new complaints.  For surgery on Monday.  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  She presented with rectal pain, bleeding.  Found to have local progression of disease on last admission.  Continue pain management and supportive care.  We also requested palliative care evaluation for pain management. IR did CT-guided nerve block on 10.19.21. Goal is  to control her pain better. Continue bowel regimen to avoid constipation. Continue current pain management regimen: She is on fentanyl patch, oral Dilaudid, gabapentin, Decadron Abdominal/pelvis CT ordered during this admission did not show any significant change in the and anal mass.   General surgery again consulted and given option for diverting colostomy and patient has  decided and agreed after she discussed with family.  Plan for Monday.  She does not want to continue any further chemotherapy and is open to home hospice after surgery. 03/17/2020: Optimize pain control.  Normocytic anemia: From chronic blood loss from anal cancer.  Currently hemoglobin stable.  HIV: CD4 of 76.  Not currently on PJP prophylaxis due to intolerance to Bactrim, dapsone.  Follows with ID.    Hypertension: Blood pressure medicines were discontinued on last admission.  Severe protein calorie malnutrition: Nutrition consulted  Debility/deconditioning: PT recommended SNF vs HH on last admission.  Patient expressed  interested on skilled nursing facility at this time but now is open for home with hospice ,we will discuss further about this after surgery.  Nutrition Problem: Severe Malnutrition Etiology: chronic illness, cancer and cancer related treatments      DVT prophylaxis:SCD Code Status: DnR Family Communication: Discussed with daughter on phone on 03/10/20 Status is: Inpatient  Remains inpatient appropriate because:Inpatient level of care appropriate due to severity of illness   Dispo: The patient is from: Home              Anticipated d/c is to:SnF vs Home with Hospice              Anticipated d/c date is: Not sure at this point              Patient currently is not medically stable to d/c. Patient awaiting for diverting colostomy   Consultants: Oncology, surgery  Procedures: None  Antimicrobials:  Anti-infectives (From admission, onward)   Start  Dose/Rate Route Frequency Ordered Stop   03/12/20 1000  abacavir-dolutegravir-lamiVUDine (TRIUMEQ) 431-54-008 MG per tablet 1 tablet        1 tablet Oral Daily 03/12/20 0914        Subjective: No new complaint. Anal pain persists.  Objective: Vitals:   03/16/20 1541 03/16/20 2127 03/17/20 0600 03/17/20 1323  BP:  100/72 113/73 122/81 96/69  Pulse: 80 73 (!) 59 75  Resp: 18 17 17 16   Temp: 98.5 F (36.9 C) 97.9 F (36.6 C) 98.3 F (36.8 C) 97.9 F (36.6 C)  TempSrc: Oral Oral Oral Oral  SpO2: 95% 99% 97% 97%  Weight:      Height:       No intake or output data in the 24 hours ending 03/17/20 1731 Filed Weights   03/12/20 0550  Weight: 58.5 kg    Examination:   General exam: Chronically ill looking.  Cachectic.   HEENT: Pallor. Respiratory system: Clear to auscultations.   Cardiovascular system: S1 & S2 heard Gastrointestinal system: Abdomen is nondistended, soft and nontender. No organomegaly or masses felt. Normal bowel sounds heard. Central nervous system: Awake and alert.   Extremities: No edema.  Data Reviewed: I have personally reviewed following labs and imaging studies  CBC: Recent Labs  Lab 03/12/20 0626 03/15/20 0425  WBC 6.8 7.2  NEUTROABS 5.8 6.0  HGB 9.1* 9.4*  HCT 28.6* 29.6*  MCV 100.0 101.0*  PLT 144* 676*   Basic Metabolic Panel: Recent Labs  Lab 03/12/20 0626  NA 139  K 3.7  CL 102  CO2 28  GLUCOSE 102*  BUN 20  CREATININE 0.61  CALCIUM 9.7   GFR: Estimated Creatinine Clearance: 51.6 mL/min (by C-G formula based on SCr of 0.61 mg/dL). Liver Function Tests: No results for input(s): AST, ALT, ALKPHOS, BILITOT, PROT, ALBUMIN in the last 168 hours. No results for input(s): LIPASE, AMYLASE in the last 168 hours. No results for input(s): AMMONIA in the last 168 hours. Coagulation Profile: No results for input(s): INR, PROTIME in the last 168 hours. Cardiac Enzymes: No results for input(s): CKTOTAL, CKMB, CKMBINDEX, TROPONINI in the last 168 hours. BNP (last 3 results) No results for input(s): PROBNP in the last 8760 hours. HbA1C: No results for input(s): HGBA1C in the last 72 hours. CBG: No results for input(s): GLUCAP in the last 168 hours. Lipid Profile: No results for input(s): CHOL, HDL, LDLCALC, TRIG, CHOLHDL, LDLDIRECT in the last 72  hours. Thyroid Function Tests: No results for input(s): TSH, T4TOTAL, FREET4, T3FREE, THYROIDAB in the last 72 hours. Anemia Panel: No results for input(s): VITAMINB12, FOLATE, FERRITIN, TIBC, IRON, RETICCTPCT in the last 72 hours. Sepsis Labs: No results for input(s): PROCALCITON, LATICACIDVEN in the last 168 hours.  Recent Results (from the past 240 hour(s))  Surgical PCR screen     Status: None   Collection Time: 03/08/20 10:14 PM   Specimen: Nasal Mucosa; Nasal Swab  Result Value Ref Range Status   MRSA, PCR NEGATIVE NEGATIVE Final   Staphylococcus aureus NEGATIVE NEGATIVE Final    Comment: (NOTE) The Xpert SA Assay (FDA approved for NASAL specimens in patients 56 years of age and older), is one component of a comprehensive surveillance program. It is not intended to diagnose infection nor to guide or monitor treatment. Performed at [facility], [place] [address], [place], [place] [address]   Respiratory Panel by RT PCR (Flu A&B, Covid) - Nasopharyngeal Swab     Status: None   Collection Time: 03/12/20  7:17 AM   Specimen: Nasopharyngeal Swab  Result Value Ref Range Status   SARS Coronavirus 2 by RT PCR NEGATIVE NEGATIVE Final    Comment: (NOTE) SARS-CoV-2 target nucleic acids are NOT DETECTED.  The SARS-CoV-2 RNA is generally detectable in upper respiratoy specimens during the acute phase of infection. The lowest concentration of SARS-CoV-2 viral copies this assay can detect is 131 copies/mL. A negative result does not preclude SARS-Cov-2 infection and should not be used as the sole basis for treatment or other patient management decisions. A negative result may occur with  improper specimen collection/handling, submission of specimen other than nasopharyngeal swab, presence of viral mutation(s) within the areas targeted by this assay, and inadequate number of viral copies (<131 copies/mL). A negative result must be combined with clinical observations,  patient history, and epidemiological information. The expected result is Negative.  Fact Sheet for Patients:  PinkCheek.be  Fact Sheet for Healthcare Providers:  GravelBags.it  This test is no t yet approved or cleared by the [place] FDA and  has been authorized for detection and/or diagnosis of SARS-CoV-2 by FDA under an Emergency Use Authorization (EUA). This EUA will remain  in effect (meaning this test can be used) for the duration of the COVID-19 declaration under Section 564(b)(1) of the Act, 21 U.S.C. section 360bbb-3(b)(1), unless the authorization is terminated or revoked sooner.     Influenza A by PCR NEGATIVE NEGATIVE Final   Influenza B by PCR NEGATIVE NEGATIVE Final    Comment: (NOTE) The Xpert Xpress SARS-CoV-2/FLU/RSV assay is intended as an aid in  the diagnosis of influenza from Nasopharyngeal swab specimens and  should not be used as a sole basis for treatment. Nasal washings and  aspirates are unacceptable for Xpert Xpress SARS-CoV-2/FLU/RSV  testing.  Fact Sheet for Patients: PinkCheek.be  Fact Sheet for Healthcare Providers: GravelBags.it  This test is not yet approved or cleared by the [place] FDA and  has been authorized for detection and/or diagnosis of SARS-CoV-2 by  FDA under an Emergency Use Authorization (EUA). This EUA will remain  in effect (meaning this test can be used) for the duration of the  Covid-19 declaration under Section 564(b)(1) of the Act, 21  U.S.C. section 360bbb-3(b)(1), unless the authorization is  terminated or revoked. Performed at [facility], [place] [address], [place], [place] [address]          Radiology Studies: No results found.      Scheduled Meds: . abacavir-dolutegravir-lamiVUDine  1 tablet Oral Daily  . [START ON 03/18/2020] dexamethasone  2 mg Oral Daily  .  DULoxetine  20 mg Oral Daily  . enoxaparin (LOVENOX) injection  40 mg Subcutaneous Daily  . fentaNYL  1 patch Transdermal Q72H  . gabapentin  600 mg Oral BID  . lactose free nutrition  237 mL Oral TID WC  . liver oil-zinc oxide   Topical BID  . polyethylene glycol  17 g Oral Daily  . potassium chloride SA  20 mEq Oral Daily  . senna-docusate  1 tablet Oral BID  . sucralfate  1 g Oral BID   Continuous Infusions:    LOS: 5 days    Time spent: 25 mins.     Bonnell Public, MD [facility] P10/30/2021, 5:31 PM

## 2020-03-18 DIAGNOSIS — Z515 Encounter for palliative care: Secondary | ICD-10-CM | POA: Diagnosis not present

## 2020-03-18 DIAGNOSIS — R531 Weakness: Secondary | ICD-10-CM | POA: Diagnosis not present

## 2020-03-18 DIAGNOSIS — K6289 Other specified diseases of anus and rectum: Secondary | ICD-10-CM | POA: Diagnosis not present

## 2020-03-18 DIAGNOSIS — E43 Unspecified severe protein-calorie malnutrition: Secondary | ICD-10-CM | POA: Diagnosis not present

## 2020-03-18 NOTE — Progress Notes (Signed)
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  General surgery and oncology were following.  Palliative care consulted for pain management and IR did CT-guided nerve block.  Hospital course was remarkable for significant pain requiring IV pain medications and prolonged hospital stay. Admitted this time for severe anal pain.  Oncology following.Plan for diverting colostomy as per general surgery on Monday.  03/18/2020: Patient seen.  No new complaints.  For possible surgery in am.  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  She presented with rectal pain, bleeding.  Found to have local progression of disease on last admission.  Continue pain management and supportive care.  We also requested palliative care evaluation for pain management. IR did CT-guided nerve block on 10.19.21. Goal is  to control her pain better. Continue bowel regimen to avoid constipation. Continue current pain management regimen: She is on fentanyl patch, oral Dilaudid, gabapentin, Decadron Abdominal/pelvis CT ordered during this admission did not show any significant change in the and anal  mass.  General surgery again consulted and given option for diverting colostomy and patient has  decided and agreed after she discussed with family.  Plan for Monday.  She does not want to continue any further chemotherapy and is open to home hospice after surgery. 03/18/2020: Optimize pain control.  Normocytic anemia: From chronic blood loss from anal cancer.  Currently hemoglobin stable.  HIV: CD4 of 76.  Not currently on PJP prophylaxis due to intolerance to Bactrim, dapsone.  Follows with ID.    Hypertension: Blood pressure medicines were discontinued on last admission.  Severe protein calorie malnutrition: Nutrition consulted  Debility/deconditioning: PT recommended SNF vs HH on last admission.  Patient expressed  interested on skilled nursing facility at this time but now is open for home with hospice ,we will discuss further about this after surgery.  Nutrition Problem: Severe Malnutrition Etiology: chronic illness, cancer and cancer related treatments      DVT prophylaxis:SCD Code Status: DnR Family Communication: Discussed with daughter on phone on 03/10/20 Status is: Inpatient  Remains inpatient appropriate because:Inpatient level of care appropriate due to severity of illness   Dispo: The patient is from: Home              Anticipated d/c is to:SnF vs Home with Hospice              Anticipated d/c date is: Not sure at this point              Patient currently is not medically stable to d/c. Patient awaiting for diverting colostomy   Consultants: Oncology, surgery  Procedures: None  Antimicrobials:  Anti-infectives (From admission, onward)   Start  Dose/Rate Route Frequency Ordered Stop   03/12/20 1000  abacavir-dolutegravir-lamiVUDine (TRIUMEQ) [id number] MG per tablet 1 tablet        1 tablet Oral Daily 03/12/20 0914        Subjective: No new complaint.  Objective: Vitals:   03/17/20 1323 03/17/20 1958 03/18/20 0619 03/18/20 1405  BP: 96/69 119/77  130/73 101/73  Pulse: 75 81 75 76  Resp: 16 16 16 15   Temp: 97.9 F (36.6 C) 98.7 F (37.1 C) 99.7 F (37.6 C) 98.4 F (36.9 C)  TempSrc: Oral Oral Oral Oral  SpO2: 97% 100% 100% 96%  Weight:      Height:        Intake/Output Summary (Last 24 hours) at 03/18/2020 1735 Last data filed at 03/18/2020 0830 Gross per 24 hour  Intake 237 ml  Output --  Net 237 ml   Filed Weights   03/12/20 0550  Weight: 58.5 kg    Examination:   General exam: Chronically ill looking.  Cachectic.   HEENT: Pallor. Respiratory system: Clear to auscultations.   Cardiovascular system: S1 & S2 heard Gastrointestinal system: Abdomen is nondistended, soft and nontender. No organomegaly or masses felt. Normal bowel sounds heard. Central nervous system: Awake and alert.   Extremities: No edema.  Data Reviewed: I have personally reviewed following labs and imaging studies  CBC: Recent Labs  Lab 03/12/20 0626 03/15/20 0425  WBC 6.8 7.2  NEUTROABS 5.8 6.0  HGB 9.1* 9.4*  HCT 28.6* 29.6*  MCV 100.0 101.0*  PLT 144* 354*   Basic Metabolic Panel: Recent Labs  Lab 03/12/20 0626  NA 139  K 3.7  CL 102  CO2 28  GLUCOSE 102*  BUN 20  CREATININE 0.61  CALCIUM 9.7   GFR: Estimated Creatinine Clearance: 51.6 mL/min (by C-G formula based on SCr of 0.61 mg/dL). Liver Function Tests: No results for input(s): AST, ALT, ALKPHOS, BILITOT, PROT, ALBUMIN in the last 168 hours. No results for input(s): LIPASE, AMYLASE in the last 168 hours. No results for input(s): AMMONIA in the last 168 hours. Coagulation Profile: No results for input(s): INR, PROTIME in the last 168 hours. Cardiac Enzymes: No results for input(s): CKTOTAL, CKMB, CKMBINDEX, TROPONINI in the last 168 hours. BNP (last 3 results) No results for input(s): PROBNP in the last 8760 hours. HbA1C: No results for input(s): HGBA1C in the last 72 hours. CBG: No results for input(s): GLUCAP in the last 168 hours. Lipid Profile: No  results for input(s): CHOL, HDL, LDLCALC, TRIG, CHOLHDL, LDLDIRECT in the last 72 hours. Thyroid Function Tests: No results for input(s): TSH, T4TOTAL, FREET4, T3FREE, THYROIDAB in the last 72 hours. Anemia Panel: No results for input(s): VITAMINB12, FOLATE, FERRITIN, TIBC, IRON, RETICCTPCT in the last 72 hours. Sepsis Labs: No results for input(s): PROCALCITON, LATICACIDVEN in the last 168 hours.  Recent Results (from the past 240 hour(s))  Surgical PCR screen     Status: None   Collection Time: 03/08/20 10:14 PM   Specimen: Nasal Mucosa; Nasal Swab  Result Value Ref Range Status   MRSA, PCR NEGATIVE NEGATIVE Final   Staphylococcus aureus NEGATIVE NEGATIVE Final    Comment: (NOTE) The Xpert SA Assay (FDA approved for NASAL specimens in patients 44 years of age and older), is one component of a comprehensive surveillance program. It is not intended to diagnose infection nor to guide or monitor treatment. Performed at [facility], [place] [address] [place], [place] [address]   Respiratory Panel by RT PCR (Flu  A&B, Covid) - Nasopharyngeal Swab     Status: None   Collection Time: 03/12/20  7:17 AM   Specimen: Nasopharyngeal Swab  Result Value Ref Range Status   SARS Coronavirus 2 by RT PCR NEGATIVE NEGATIVE Final    Comment: (NOTE) SARS-CoV-2 target nucleic acids are NOT DETECTED.  The SARS-CoV-2 RNA is generally detectable in upper respiratoy specimens during the acute phase of infection. The lowest concentration of SARS-CoV-2 viral copies this assay can detect is 131 copies/mL. A negative result does not preclude SARS-Cov-2 infection and should not be used as the sole basis for treatment or other patient management decisions. A negative result may occur with  improper specimen collection/handling, submission of specimen other than nasopharyngeal swab, presence of viral mutation(s) within the areas targeted by this assay, and inadequate number of viral  copies (<131 copies/mL). A negative result must be combined with clinical observations, patient history, and epidemiological information. The expected result is Negative.  Fact Sheet for Patients:  PinkCheek.be  Fact Sheet for Healthcare Providers:  GravelBags.it  This test is no t yet approved or cleared by the [place] FDA and  has been authorized for detection and/or diagnosis of SARS-CoV-2 by FDA under an Emergency Use Authorization (EUA). This EUA will remain  in effect (meaning this test can be used) for the duration of the COVID-19 declaration under Section 564(b)(1) of the Act, 21 U.S.C. section 360bbb-3(b)(1), unless the authorization is terminated or revoked sooner.     Influenza A by PCR NEGATIVE NEGATIVE Final   Influenza B by PCR NEGATIVE NEGATIVE Final    Comment: (NOTE) The Xpert Xpress SARS-CoV-2/FLU/RSV assay is intended as an aid in  the diagnosis of influenza from Nasopharyngeal swab specimens and  should not be used as a sole basis for treatment. Nasal washings and  aspirates are unacceptable for Xpert Xpress SARS-CoV-2/FLU/RSV  testing.  Fact Sheet for Patients: PinkCheek.be  Fact Sheet for Healthcare Providers: GravelBags.it  This test is not yet approved or cleared by the [place] FDA and  has been authorized for detection and/or diagnosis of SARS-CoV-2 by  FDA under an Emergency Use Authorization (EUA). This EUA will remain  in effect (meaning this test can be used) for the duration of the  Covid-19 declaration under Section 564(b)(1) of the Act, 21  U.S.C. section 360bbb-3(b)(1), unless the authorization is  terminated or revoked. Performed at [facility], [place] [address], [place], [place] [address]          Radiology Studies: No results found.      Scheduled Meds: .  abacavir-dolutegravir-lamiVUDine  1 tablet Oral Daily  . dexamethasone  2 mg Oral Daily  . DULoxetine  20 mg Oral Daily  . enoxaparin (LOVENOX) injection  40 mg Subcutaneous Daily  . fentaNYL  1 patch Transdermal Q72H  . gabapentin  600 mg Oral BID  . lactose free nutrition  237 mL Oral TID WC  . liver oil-zinc oxide   Topical BID  . polyethylene glycol  17 g Oral Daily  . potassium chloride SA  20 mEq Oral Daily  . senna-docusate  1 tablet Oral BID  . sucralfate  1 g Oral BID   Continuous Infusions:    LOS: 6 days    Time spent: 25 mins.     Bonnell Public, MD [facility] P10/31/2021, 5:35 PM

## 2020-03-18 NOTE — Progress Notes (Signed)
S: no issues overnight O: BP 130/73 (BP Location: Left Arm)   Pulse 75   Temp 99.7 F (37.6 C) (Oral)   Resp 16   Ht 5' (1.524 m)   Wt 58.5 kg   SpO2 100%   BMI 25.19 kg/m  Gen: NAD Neuro: AOx4 Abd: soft, NT, ND  A/P 69 yo female with advance SCC of anus readmitted for persistent perianal pain not responsive to nerve block. She has incontinence worse now. -discussed plan for fecal diversion again. We discussed details of laparoscopic attempt, identifying a mobile portion of colon and bringing it up to the ostomy through the abdominal wall. We discussed risks of leak, infection, bleeding, ostomy issues, need for larger procedure, and injury to surrounding tissues. She had questions of why surgery would not remove the cancer which was explained as to greater morbidity without prognostic improvement. -NPO after midnight

## 2020-03-18 NOTE — Progress Notes (Signed)
Daily Progress Note   Patient Name: Vanessa Beard       Date: 03/18/2020 DOB: 11/25/1950  Age: 69 y.o. MRN#: [id number] Attending Physician: Bonnell Public, MD Primary Care Physician: Seward Carol, MD Admit Date: 03/12/2020  Reason for Consultation/Follow-up: Establishing goals of care  Subjective: Patient is awake alert resting in bed, we discussed about her upcoming surgery, patient states that she has a lot to live for, that she is in school, earning a criminal justice degree, she hopes for ongoing treatments and remains invested in therapies that will help her.      Length of Stay: 6  Current Medications: Scheduled Meds:  . abacavir-dolutegravir-lamiVUDine  1 tablet Oral Daily  . dexamethasone  2 mg Oral Daily  . DULoxetine  20 mg Oral Daily  . enoxaparin (LOVENOX) injection  40 mg Subcutaneous Daily  . fentaNYL  1 patch Transdermal Q72H  . gabapentin  600 mg Oral BID  . lactose free nutrition  237 mL Oral TID WC  . liver oil-zinc oxide   Topical BID  . polyethylene glycol  17 g Oral Daily  . potassium chloride SA  20 mEq Oral Daily  . senna-docusate  1 tablet Oral BID  . sucralfate  1 g Oral BID    Continuous Infusions:   PRN Meds: belladonna-opium, butamben-tetracaine-benzocaine, glycerin (Pediatric), HYDROmorphone (DILAUDID) injection, HYDROmorphone, zolpidem  Physical Exam         No distress Awake alert Regular work of breathing Abdomen not distended Non focal No edema S1 S 2   Vital Signs: BP 130/73 (BP Location: Left Arm)   Pulse 75   Temp 99.7 F (37.6 C) (Oral)   Resp 16   Ht 5' (1.524 m)   Wt 58.5 kg   SpO2 100%   BMI 25.19 kg/m  SpO2: SpO2: 100 % O2 Device: O2 Device: Room Air O2 Flow Rate:    Intake/output summary:    Intake/Output Summary (Last 24 hours) at 03/18/2020 1155 Last data filed at 03/18/2020 0830 Gross per 24 hour  Intake 237 ml  Output -  Net 237 ml   LBM: Last BM Date: 03/17/20 (leakage) Baseline Weight: Weight: 58.5 kg Most recent weight: Weight: 58.5 kg      PPS 50% Palliative Assessment/Data:      Patient Active Problem List  Diagnosis Date Noted  . Palliative care by specialist   . General weakness   . Protein-calorie malnutrition, severe 03/05/2020  . Palliative care encounter 01/27/2020  . Goals of care, counseling/discussion 01/16/2020  . Hemorrhoid 12/16/2019  . Rectal pain 12/16/2019  . Anal cancer ([place]) 07/26/2019  . Rectal mass 07/14/2019  . Constipation 07/14/2019  . Anal fissure 07/14/2019  . Healthcare maintenance 06/30/2018  . Left knee pain 12/30/2017  . Left shoulder pain 12/30/2017  . Closed fracture of right tibial plateau 02/18/2017  . Benign hypertension 02/15/2017  . Screening examination for venereal disease 03/06/2015  . Encounter for long-term (current) use of medications 03/06/2015  . Elevated LFTs 12/11/2014  . Abnormal ultrasound 12/11/2014  . Abdominal pain, epigastric 12/11/2014  . ASCUS of cervix with negative high risk HPV 10/31/2013  . Gall bladder disease 02/03/2013  . 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  . Compression fracture of L3 lumbar vertebra 07/01/2011  . Thyroid dysfunction 07/01/2011  . Anemia 07/01/2011  . Sore throat 04/02/2011  . Hyperparathyroidism 02/10/2011  . Osteopenia 02/03/2011  . PAP SMEAR, LGSIL, ABNORMAL 12/21/2009  . COUGH 07/18/2009  . Chronic hepatitis C virus infection ([place]) 12/19/2008  . LEUKORRHEA 04/07/2008  . FATIGUE 04/07/2008  . BACK PAIN, LUMBAR 11/10/2007  . ALLERGIC RHINITIS 07/09/2007  . PNEUMOCYSTIS PNEUMONIA 05/07/2007  . Essential hypertension 05/07/2007  . DENTAL CARIES 05/07/2007  . INSOMNIA 05/07/2007  . PNEUMONIA, HX OF 05/07/2007  . HERPES ZOSTER,  HX OF 05/07/2007  . Human immunodeficiency virus (HIV) disease ([place]) 08/04/2006    Palliative Care Assessment & Plan   Patient Profile:    Assessment:  squamous cell ca anus Severe pain Necrotic anorectal mass Has history of HIV HTN  Recommendations/Plan:   patient states she is to undergo surgery for fecal diversion, possibly ostomy tomorrow. Surgery following,Med onc also following, palliative following for pain management.   Continue current mode of care. Total of IV Dilaudid in the past 24 hours.  Offered active listening and supportive care as patient prepares herself for surgery tomorrow.    Goals of Care and Additional Recommendations:  Limitations on Scope of Treatment: Full Scope Treatment  Code Status:    Code Status Orders  (From admission, onward)         Start     Ordered   03/12/20 0940  Do not attempt resuscitation (DNR)  Continuous       Question Answer Comment  In the event of cardiac or respiratory ARREST Do not call a "code blue"   In the event of cardiac or respiratory ARREST Do not perform Intubation, CPR, defibrillation or ACLS   In the event of cardiac or respiratory ARREST Use medication by any route, position, wound care, and other measures to relive pain and suffering. May use oxygen, suction and manual treatment of airway obstruction as needed for comfort.      03/12/20 0939        Code Status History    Date Active Date Inactive Code Status Order ID Comments User Context   03/03/2020 1955 03/11/2020 2230 DNR [id number]  Lenore Cordia, MD ED   02/15/2017 2327 02/21/2017 1728 Full Code [id number]  Jani Gravel, MD Inpatient   Advance Care Planning Activity       Prognosis:   Unable to determine  Discharge Planning:  To Be Determined  Care plan was discussed with  Patient     Thank you for allowing the Palliative Medicine Team to assist in  the care of this patient.   Time In: 10 Time Out: 10.25 Total Time 25 Prolonged Time Billed   no       Greater than 50%  of this time was spent counseling and coordinating care related to the above assessment and plan.  Loistine Chance, MD  Please contact Palliative Medicine Team phone at [phone] for questions and concerns.

## 2020-03-19 ENCOUNTER — Encounter (HOSPITAL_COMMUNITY)
Admission: EM | Disposition: A | Discharge status: Home Health | Payer: Self-pay | Source: Home / Self Care | Attending: Internal Medicine

## 2020-03-19 ENCOUNTER — Inpatient Hospital Stay (HOSPITAL_COMMUNITY): Payer: Medicare HMO | Admitting: Certified Registered"

## 2020-03-19 DIAGNOSIS — K6289 Other specified diseases of anus and rectum: Secondary | ICD-10-CM | POA: Diagnosis not present

## 2020-03-19 HISTORY — PX: LAPAROSCOPIC LOOP COLOSTOMY: SHX6816

## 2020-03-19 LAB — TYPE AND SCREEN
ABO/RH(D): A POS
Antibody Screen: NEGATIVE

## 2020-03-19 LAB — CREATININE, SERUM
Creatinine, Ser: 0.61 mg/dL (ref 0.44–1.00)
GFR, Estimated: >60 mL/min (ref >60)

## 2020-03-19 SURGERY — CREATION, COLOSTOMY, LOOP, LAPAROSCOPIC
Anesthesia: General

## 2020-03-19 MED ORDER — FENTANYL CITRATE (PF) 250 MCG/5ML IJ SOLN
INTRAMUSCULAR | Status: AC
  Filled 2020-03-19: qty 5

## 2020-03-19 MED ORDER — PHENYLEPHRINE 40 MCG/ML (10ML) SYRINGE FOR IV PUSH (FOR BLOOD PRESSURE SUPPORT)
PREFILLED_SYRINGE | INTRAVENOUS | Status: DC | PRN
  Administered 2020-03-19: 120 ug via INTRAVENOUS

## 2020-03-19 MED ORDER — CHLORHEXIDINE GLUCONATE CLOTH 2 % EX PADS
6.0000 | MEDICATED_PAD | Freq: Once | CUTANEOUS | Status: AC
  Administered 2020-03-19: 6 via TOPICAL

## 2020-03-19 MED ORDER — DEXAMETHASONE SODIUM PHOSPHATE 10 MG/ML IJ SOLN
INTRAMUSCULAR | Status: DC | PRN
  Administered 2020-03-19: 8 mg via INTRAVENOUS

## 2020-03-19 MED ORDER — BUPIVACAINE LIPOSOME 1.3 % IJ SUSP
20.0000 mL | Freq: Once | INTRAMUSCULAR | Status: AC
  Administered 2020-03-19: 20 mL
  Filled 2020-03-19: qty 20

## 2020-03-19 MED ORDER — HYDROMORPHONE HCL 1 MG/ML IJ SOLN: 0.2500 mg | INTRAMUSCULAR | Status: DC | PRN

## 2020-03-19 MED ORDER — FENTANYL CITRATE (PF) 100 MCG/2ML IJ SOLN
INTRAMUSCULAR | Status: DC | PRN
  Administered 2020-03-19: 25 ug via INTRAVENOUS
  Administered 2020-03-19 (×2): 50 ug via INTRAVENOUS
  Administered 2020-03-19: 75 ug via INTRAVENOUS

## 2020-03-19 MED ORDER — PROPOFOL 10 MG/ML IV BOLUS
INTRAVENOUS | Status: DC | PRN
  Administered 2020-03-19: 70 mg via INTRAVENOUS

## 2020-03-19 MED ORDER — HYDROMORPHONE HCL 1 MG/ML IJ SOLN
INTRAMUSCULAR | Status: DC | PRN
Start: 2020-03-19 — End: 2020-03-19
  Administered 2020-03-19: 1 mg via INTRAVENOUS

## 2020-03-19 MED ORDER — OXYCODONE HCL 5 MG PO TABS: 5.0000 mg | ORAL_TABLET | Freq: Once | ORAL | Status: DC | PRN

## 2020-03-19 MED ORDER — 0.9 % SODIUM CHLORIDE (POUR BTL) OPTIME
TOPICAL | Status: DC | PRN
  Administered 2020-03-19: 2000 mL

## 2020-03-19 MED ORDER — BUPIVACAINE-EPINEPHRINE (PF) 0.25% -1:200000 IJ SOLN
INTRAMUSCULAR | Status: AC
  Filled 2020-03-19: qty 30

## 2020-03-19 MED ORDER — ONDANSETRON HCL 4 MG/2ML IJ SOLN: 4.0000 mg | Freq: Once | INTRAMUSCULAR | Status: DC | PRN

## 2020-03-19 MED ORDER — BUPIVACAINE-EPINEPHRINE 0.25% -1:200000 IJ SOLN
INTRAMUSCULAR | Status: DC | PRN
  Administered 2020-03-19: 30 mL

## 2020-03-19 MED ORDER — POLYETHYLENE GLYCOL 3350 17 G PO PACK: 17.0000 g | PACK | Freq: Every day | ORAL | Status: DC | PRN

## 2020-03-19 MED ORDER — HYDROMORPHONE HCL 2 MG/ML IJ SOLN
INTRAMUSCULAR | Status: AC
  Filled 2020-03-19: qty 1

## 2020-03-19 MED ORDER — LIDOCAINE 2% (20 MG/ML) 5 ML SYRINGE
INTRAMUSCULAR | Status: DC | PRN
  Administered 2020-03-19: 40 mg via INTRAVENOUS

## 2020-03-19 MED ORDER — PHENYLEPHRINE HCL-NACL 10-0.9 MG/250ML-% IV SOLN
INTRAVENOUS | Status: DC | PRN
  Administered 2020-03-19: 25 ug/min via INTRAVENOUS

## 2020-03-19 MED ORDER — ACETAMINOPHEN 500 MG PO TABS
1000.0000 mg | ORAL_TABLET | Freq: Three times a day (TID) | ORAL | Status: DC | PRN
  Administered 2020-03-21 – 2020-03-24 (×3): 1000 mg via ORAL
  Filled 2020-03-19 (×3): qty 2

## 2020-03-19 MED ORDER — SUCCINYLCHOLINE CHLORIDE 200 MG/10ML IV SOSY
PREFILLED_SYRINGE | INTRAVENOUS | Status: DC | PRN
  Administered 2020-03-19: 60 mg via INTRAVENOUS

## 2020-03-19 MED ORDER — OXYCODONE HCL 5 MG/5ML PO SOLN: 5.0000 mg | Freq: Once | ORAL | Status: DC | PRN

## 2020-03-19 MED ORDER — ACETAMINOPHEN 500 MG PO TABS
1000.0000 mg | ORAL_TABLET | Freq: Once | ORAL | Status: AC
  Administered 2020-03-19: 1000 mg via ORAL
  Filled 2020-03-19: qty 2

## 2020-03-19 MED ORDER — ROCURONIUM BROMIDE 10 MG/ML (PF) SYRINGE
PREFILLED_SYRINGE | INTRAVENOUS | Status: DC | PRN
  Administered 2020-03-19: 40 mg via INTRAVENOUS
  Administered 2020-03-19: 20 mg via INTRAVENOUS
  Administered 2020-03-19: 10 mg via INTRAVENOUS

## 2020-03-19 MED ORDER — EPHEDRINE SULFATE-NACL 50-0.9 MG/10ML-% IV SOSY
PREFILLED_SYRINGE | INTRAVENOUS | Status: DC | PRN
  Administered 2020-03-19 (×3): 10 mg via INTRAVENOUS

## 2020-03-19 MED ORDER — SODIUM CHLORIDE 0.9 % IV SOLN
2.0000 g | INTRAVENOUS | Status: AC
  Administered 2020-03-19: 2 g via INTRAVENOUS
  Filled 2020-03-19: qty 2

## 2020-03-19 MED ORDER — LACTATED RINGERS IR SOLN
Status: DC | PRN
  Administered 2020-03-19: 1000 mL

## 2020-03-19 MED ORDER — PHENYLEPHRINE 40 MCG/ML (10ML) SYRINGE FOR IV PUSH (FOR BLOOD PRESSURE SUPPORT)
PREFILLED_SYRINGE | INTRAVENOUS | Status: AC
  Filled 2020-03-19: qty 10

## 2020-03-19 MED ORDER — LACTATED RINGERS IV SOLN: INTRAVENOUS | Status: DC

## 2020-03-19 MED ORDER — LACTATED RINGERS IV SOLN: INTRAVENOUS | Status: DC | PRN

## 2020-03-19 SURGICAL SUPPLY — 52 items
ADH SKN CLS APL DERMABOND .7 (GAUZE/BANDAGES/DRESSINGS) ×1
APPLIER CLIP 5 13 M/L LIGAMAX5 (MISCELLANEOUS)
APR CLP MED LRG 5 ANG JAW (MISCELLANEOUS)
CABLE HIGH FREQUENCY MONO STRZ (ELECTRODE) ×2 IMPLANT
CATH ROBINSON RED A/P 16FR (CATHETERS) ×2 IMPLANT
CELLS DAT CNTRL 66122 CELL SVR (MISCELLANEOUS) IMPLANT
CLIP APPLIE 5 13 M/L LIGAMAX5 (MISCELLANEOUS) IMPLANT
COVER MAYO STAND STRL (DRAPES) ×2 IMPLANT
COVER WAND RF STERILE (DRAPES) IMPLANT
DECANTER SPIKE VIAL GLASS SM (MISCELLANEOUS) ×2 IMPLANT
DERMABOND ADVANCED (GAUZE/BANDAGES/DRESSINGS) ×1
DERMABOND ADVANCED .7 DNX12 (GAUZE/BANDAGES/DRESSINGS) ×1 IMPLANT
DRAIN CHANNEL 19F RND (DRAIN) IMPLANT
DRSG TEGADERM 4X4.75 (GAUZE/BANDAGES/DRESSINGS) IMPLANT
ELECT REM PT RETURN 15FT ADLT (MISCELLANEOUS) ×2 IMPLANT
EVACUATOR SILICONE 100CC (DRAIN) IMPLANT
GAUZE 4X4 16PLY RFD (DISPOSABLE) IMPLANT
GAUZE SPONGE 4X4 12PLY STRL (GAUZE/BANDAGES/DRESSINGS) IMPLANT
GLOVE BIO SURGEON STRL SZ 6.5 (GLOVE) ×4 IMPLANT
GLOVE BIOGEL PI IND STRL 7.0 (GLOVE) ×1 IMPLANT
GLOVE BIOGEL PI INDICATOR 7.0 (GLOVE) ×1
IRRIG SUCT STRYKERFLOW 2 WTIP (MISCELLANEOUS) ×2
IRRIGATION SUCT STRKRFLW 2 WTP (MISCELLANEOUS) ×1 IMPLANT
KIT TURNOVER KIT A (KITS) ×2 IMPLANT
PACK COLON (CUSTOM PROCEDURE TRAY) ×2 IMPLANT
PORT LAP GEL ALEXIS MED 5-9CM (MISCELLANEOUS) IMPLANT
RTRCTR WOUND ALEXIS 18CM MED (MISCELLANEOUS)
SCISSORS LAP 5X35 DISP (ENDOMECHANICALS) ×2 IMPLANT
SEALER TISSUE G2 STRG ARTC 35C (ENDOMECHANICALS) IMPLANT
SET TUBE SMOKE EVAC HIGH FLOW (TUBING) ×2 IMPLANT
SLEEVE XCEL OPT CAN 5 100 (ENDOMECHANICALS) ×2 IMPLANT
STAPLER PROXIMATE 75MM BLUE (STAPLE) ×2 IMPLANT
STAPLER VISISTAT 35W (STAPLE) IMPLANT
SUT ETHILON 2 0 PS N (SUTURE) IMPLANT
SUT NOVA NAB DX-16 0-1 5-0 T12 (SUTURE) IMPLANT
SUT PDS AB 1 CTX 36 (SUTURE) IMPLANT
SUT PDS AB 1 TP1 96 (SUTURE) IMPLANT
SUT PROLENE 2 0 KS (SUTURE) IMPLANT
SUT SILK 2 0 (SUTURE) ×2
SUT SILK 2 0 SH CR/8 (SUTURE) IMPLANT
SUT SILK 2-0 18XBRD TIE 12 (SUTURE) ×1 IMPLANT
SUT SILK 3 0 (SUTURE)
SUT SILK 3 0 SH CR/8 (SUTURE) ×4 IMPLANT
SUT SILK 3-0 18XBRD TIE 12 (SUTURE) IMPLANT
SUT VIC AB 2-0 SH 18 (SUTURE) ×2 IMPLANT
SYS LAPSCP GELPORT 120MM (MISCELLANEOUS)
SYSTEM LAPSCP GELPORT 120MM (MISCELLANEOUS) IMPLANT
TOWEL OR 17X26 10 PK STRL BLUE (TOWEL DISPOSABLE) ×4 IMPLANT
TOWEL OR NON WOVEN STRL DISP B (DISPOSABLE) IMPLANT
TRAY FOLEY MTR SLVR 16FR STAT (SET/KITS/TRAYS/PACK) ×2 IMPLANT
TROCAR BLADELESS OPT 5 100 (ENDOMECHANICALS) ×4 IMPLANT
TROCAR XCEL BLUNT TIP 100MML (ENDOMECHANICALS) ×2 IMPLANT

## 2020-03-19 NOTE — Anesthesia Postprocedure Evaluation (Signed)
Anesthesia Post Note  Patient: Vanessa Beard  Procedure(s) Performed: LAPAROSCOPIC Ostomy Placement (N/A )     Patient location during evaluation: PACU Anesthesia Type: General Level of consciousness: sedated, patient cooperative and oriented Pain management: pain level controlled Vital Signs Assessment: post-procedure vital signs reviewed and stable Respiratory status: spontaneous breathing, nonlabored ventilation, respiratory function stable and patient connected to nasal cannula oxygen Cardiovascular status: blood pressure returned to baseline and stable Postop Assessment: no apparent nausea or vomiting Anesthetic complications: no   No complications documented.  Last Vitals:  Vitals:   03/19/20 1800 03/19/20 1822  BP: 110/73 112/77  Pulse: 80 81  Resp: 14 18  Temp:  36.7 C  SpO2: 98% 91%    Last Pain:  Vitals:   03/19/20 1822  TempSrc: Oral  PainSc:                  Roylee Chaffin,E. Severn Goddard

## 2020-03-19 NOTE — Anesthesia Preprocedure Evaluation (Signed)
Anesthesia Evaluation  Patient identified by MRN, date of birth, ID band Patient awake    Reviewed: Allergy & Precautions, NPO status , Patient's Chart, lab work & pertinent test results  Airway Mallampati: I  TM Distance: >3 FB Neck ROM: Full    Dental  (+) Edentulous Upper, Partial Lower,    Pulmonary neg pulmonary ROS,    Pulmonary exam normal breath sounds clear to auscultation       Cardiovascular hypertension, Pt. on medications Normal cardiovascular exam Rhythm:Regular Rate:Normal     Neuro/Psych negative neurological ROS  negative psych ROS   GI/Hepatic negative GI ROS, (+) Hepatitis -, C  Endo/Other  negative endocrine ROS  Renal/GU negative Renal ROS  negative genitourinary   Musculoskeletal negative musculoskeletal ROS (+)   Abdominal   Peds negative pediatric ROS (+)  Hematology  (+) anemia , HIV, hct 29.6, plt 123   Anesthesia Other Findings Anal squamous cell carcinoma   Methadone  Reproductive/Obstetrics negative OB ROS                             Anesthesia Physical  Anesthesia Plan  ASA: III  Anesthesia Plan: General   Post-op Pain Management:    Induction: Intravenous  PONV Risk Score and Plan: 3 and Dexamethasone, Ondansetron and Treatment may vary due to age or medical condition  Airway Management Planned: Oral ETT  Additional Equipment: None  Intra-op Plan:   Post-operative Plan: Extubation in OR  Informed Consent: I have reviewed the patients History and Physical, chart, labs and discussed the procedure including the risks, benefits and alternatives for the proposed anesthesia with the patient or authorized representative who has indicated his/her understanding and acceptance.   Patient has DNR.  Suspend DNR.   Dental advisory given  Plan Discussed with: CRNA  Anesthesia Plan Comments: (Discussed dNR with patient- wishes to suspend for 24h  perioperatively, wishes to have chest compressions in the event of life-threatening arrhythmia. )        Anesthesia Quick Evaluation

## 2020-03-19 NOTE — Progress Notes (Signed)
HEMATOLOGY-ONCOLOGY PROGRESS NOTE  SUBJECTIVE: Patient is scheduled for diverting colostomy this afternoon.  Patient requested to talk to me this morning regarding surgery. Her son has left when I went to her room in mid morning, she was laying bed, not in acute distress, pain is reasonably controlled.  Oncology History Overview Note  Cancer Staging Anal cancer ([place]) Staging form: Anus, AJCC 8th Edition - Clinical: Stage IV (cT2, cN0, cM1) - Signed by Truitt Merle, MD on 08/12/2019    Anal cancer ([place])  07/19/2019 Procedure   Colonoscopy by Dr Havery Moros 07/19/19  IMPRESSION - Ulcer noted at the anal canal in posterior midline canal that extends into the distal rectum on digital rectal exam, nodular and somewhat hard to palpation. Endoscopic images show nodular tissue in the area, concerning for malignant ulcer. - The examined portion of the ileum was normal. - One 3 mm polyp at the hepatic flexure, removed with a cold snare. Resected and retrieved. - One 5 mm polyp in the transverse colon, removed with a cold snare. Resected and retrieved. - Diverticulosis in the ascending colon. - The examination was otherwise normal.   07/19/2019 Initial Biopsy   Diagnosis 07/19/19 1. Transverse Colon Polyp, hepatic flexure (2) - TUBULAR ADENOMA (1 OF 3 FRAGMENTS) - BENIGN COLONIC MUCOSA (2 OF 3 FRAGMENTS) - NO HIGH GRADE DYSPLASIA OR MALIGNANCY IDENTIFIED 2. 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   Sub-cm hypermetabolic lymph nodes in posterior perirectal space, right inguinal region, right iliac chain, and gastrohepatic ligament, suspicious for  metastatic disease.   Small hypermetabolic left axillary lymph nodes. This would be unusual location for metastatic anal carcinoma. Recommend clinical correlation for possibility the patient has had recent COVID vaccination in the left arm, which could explain this finding.   Multifocal airspace disease in both lower lungs with marked hypermetabolic activity. This favors infectious or inflammatory etiology, and is not typical for pulmonary metastases. Short-term follow-up by chest CT is recommended.   08/15/2019 - 10/05/2019 Radiation Therapy   Concurrent ChemoRT with Dr. Lisbeth Renshaw starting 08/15/19-10/05/19   08/15/2019 - 09/12/2019 Chemotherapy   Concurrent ChemoRT with Mitomycin and 5FU on week 1 and 5 starting 08/15/19 with week 5 dose on 09/12/19.    11/02/2019 Imaging   CT CAP w contrast  IMPRESSION: No acute findings in the chest, abdomen or pelvis.   Biapical and bibasilar scarring. Bronchiectasis and paraseptal emphysema in the lung bases.   Tortuous aorta.   Small umbilical hernia containing fat.   01/05/2020 Imaging   PET SCAN IMPRESSION: 1. Partial metabolic response. Persistent anal mass hypermetabolism, decreased. No residual hypermetabolic nodal metastases in the abdomen or pelvis. Left axillary hypermetabolic lymph nodes are decreased in size and metabolism. No new or progressive hypermetabolic metastatic disease. 2. Hypermetabolism associated with patchy consolidative airspace opacities in the mid to lower lungs bilaterally, decreased in extent and metabolism, favoring resolving inflammatory opacities. 3. Aortic Atherosclerosis (ICD10-I70.0) and Emphysema (ICD10-J43.9).   01/27/2020 - 02/17/2020 Chemotherapy   The patient had pembrolizumab (KEYTRUDA) 200 mg in sodium chloride 0.9 % 50 mL chemo infusion, 200 mg, Intravenous, Once, 2 of 6 cycles Administration: 200 mg (  01/27/2020), 200 mg (02/17/2020)  for chemotherapy treatment.    03/14/2020 -  Chemotherapy   The  patient had dexamethasone (DECADRON) 4 MG tablet, 8 mg, Oral, Daily, 0 of 1 cycle, Start date: --, End date: -- palonosetron (ALOXI) injection 0.25 mg, 0.25 mg, Intravenous,  Once, 0 of 4 cycles leucovorin 624 mg in dextrose 5 % 250 mL infusion, 400 mg/m2 = 624 mg, Intravenous,  Once, 0 of 4 cycles oxaliplatin (ELOXATIN) 110 mg in dextrose 5 % 500 mL chemo infusion, 70 mg/m2 = 110 mg (100 % of original dose 70 mg/m2), Intravenous,  Once, 0 of 4 cycles Dose modification: 70 mg/m2 (original dose 70 mg/m2, Cycle 1, Reason: Provider Judgment) fluorouracil (ADRUCIL) 3,100 mg in sodium chloride 0.9 % 88 mL chemo infusion, 2,000 mg/m2 = 3,100 mg (100 % of original dose 2,000 mg/m2), Intravenous, 1 Day/Dose, 0 of 4 cycles Dose modification: 2,000 mg/m2 (original dose 2,000 mg/m2, Cycle 1, Reason: Provider Judgment)  for chemotherapy treatment.       REVIEW OF SYSTEMS:   Constitutional: Denies fevers, chills  Eyes: Denies blurriness of vision Ears, nose, mouth, throat, and face: Denies mucositis or sore throat Respiratory: Denies cough, dyspnea or wheezes Cardiovascular: Denies palpitation, chest discomfort Gastrointestinal: Continues to have rectal pain Skin: Denies abnormal skin rashes Lymphatics: Denies new lymphadenopathy or easy bruising Neurological:Denies numbness, tingling or new weaknesses Behavioral/Psych: Mood is stable, no new changes  Extremities: No lower extremity edema All other systems were reviewed with the patient and are negative.  I have reviewed the past medical history, past surgical history, social history and family history with the patient and they are unchanged from previous note.   PHYSICAL EXAMINATION: ECOG PERFORMANCE STATUS: 3 - Symptomatic, >50% confined to bed  Vitals:   03/19/20 1822 03/19/20 1921  BP: 112/77 (!) 92/53  Pulse: 81 83  Resp: 18 16  Temp: 98 F (36.7 C) 98 F (36.7 C)  SpO2: 91% 100%   Filed Weights   03/12/20 0550  Weight: 129 lb  (58.5 kg)    Intake/Output from previous day: 10/31 0701 - 11/01 0700 In: 911 [P.O.:911] Out: -   GENERAL:alert, no distress and comfortable SKIN: skin color, texture, turgor are normal, no rashes or significant lesions NEURO: alert & oriented x 3 with fluent speech, no focal motor/sensory deficits  LABORATORY DATA:  I have reviewed the data as listed CMP Latest Ref Rng & Units 03/19/2020 03/12/2020 03/05/2020  Glucose 70 - 99 mg/dL - 102(H) 87  BUN 8 - 23 mg/dL - 20 9  Creatinine 0.44 - 1.00 mg/dL 0.61 0.61 0.59  Sodium 135 - 145 mmol/L - 139 132(L)  Potassium 3.5 - 5.1 mmol/L - 3.7 3.9  Chloride 98 - 111 mmol/L - 102 101  CO2 22 - 32 mmol/L - 28 24  Calcium 8.9 - 10.3 mg/dL - 9.7 9.4  Total Protein 6.5 - 8.1 g/dL - - -  Total Bilirubin 0.3 - 1.2 mg/dL - - -  Alkaline Phos 38 - 126 U/L - - -  AST 15 - 41 U/L - - -  ALT 0 - 44 U/L - - -    Lab Results  Component Value Date   WBC 7.2 03/15/2020   HGB 9.4 (L) 03/15/2020   HCT 29.6 (L) 03/15/2020   MCV 101.0 (H) 03/15/2020   PLT 123 (L) 03/15/2020   NEUTROABS 6.0 03/15/2020    CT GUIDED NEEDLE PLACEMENT  Result Date: 03/06/2020 INDICATION: 69 year old female with anal cancer, non  remaining pain, referred for pudendal nerve block EXAM: CT GUIDANCE NEEDLE PLACEMENT MEDICATIONS: None. ANESTHESIA/SEDATION: None FLUOROSCOPY TIME:  CT COMPLICATIONS: None PROCEDURE: Informed written consent was obtained from the patient after a thorough discussion of the procedural risks, benefits and alternatives. All questions were addressed. Maximal Sterile Barrier Technique was utilized including caps, mask, sterile gowns, sterile gloves, sterile drape, hand hygiene and skin antiseptic. A timeout was performed prior to the initiation of the procedure. Patient was position prone position on the CT gantry table. Scout CT was acquired for planning purposes. The patient was then prepped and draped in the usual sterile fashion. 1% lidocaine was used  for local anesthesia. Using CT guidance, bilateral transgluteal approach was performed using 22 gauge 15 cm Chiba needles, targeting the pudendal canal within the bilateral pelvis. Once we confirmed needle tip position with CT, small amount of 40% dilute contrast was injected to assure that the needle tip was not in a vascular space. We then injected a solution of 8 cc 5% bupivacaine and 1 mL (40 mg) Kenalog at both needle sites. Needles were removed and a final CT was performed. Patient tolerated the procedure well and remained hemodynamically stable throughout. No complications were encountered and no significant blood loss. IMPRESSION: Status post CT-guided needle placement at the bilateral pudendal canal for bilateral pudendal nerve block Signed, Dulcy Fanny. Dellia Nims, RPVI [facility] Radiology Electronically Signed   By: Corrie Mckusick D.O.   On: 03/06/2020 16:31   CT GUIDED NEEDLE PLACEMENT  Result Date: 03/06/2020 INDICATION: 69 year old female with anal cancer, non remaining pain, referred for pudendal nerve block EXAM: CT GUIDANCE NEEDLE PLACEMENT MEDICATIONS: None. ANESTHESIA/SEDATION: None FLUOROSCOPY TIME:  CT COMPLICATIONS: None PROCEDURE: Informed written consent was obtained from the patient after a thorough discussion of the procedural risks, benefits and alternatives. All questions were addressed. Maximal Sterile Barrier Technique was utilized including caps, mask, sterile gowns, sterile gloves, sterile drape, hand hygiene and skin antiseptic. A timeout was performed prior to the initiation of the procedure. Patient was position prone position on the CT gantry table. Scout CT was acquired for planning purposes. The patient was then prepped and draped in the usual sterile fashion. 1% lidocaine was used for local anesthesia. Using CT guidance, bilateral transgluteal approach was performed using 22 gauge 15 cm Chiba needles, targeting the pudendal  canal within the bilateral pelvis. Once we confirmed needle tip position with CT, small amount of 40% dilute contrast was injected to assure that the needle tip was not in a vascular space. We then injected a solution of 8 cc 5% bupivacaine and 1 mL (40 mg) Kenalog at both needle sites. Needles were removed and a final CT was performed. Patient tolerated the procedure well and remained hemodynamically stable throughout. No complications were encountered and no significant blood loss. IMPRESSION: Status post CT-guided needle placement at the bilateral pudendal canal for bilateral pudendal nerve block Signed, Dulcy Fanny. Dellia Nims, RPVI [facility] Radiology Electronically Signed   By: Corrie Mckusick D.O.   On: 03/06/2020 16:31   CT ABDOMEN PELVIS W CONTRAST  Result Date: 03/14/2020 CLINICAL DATA:  Squamous cell carcinoma of the anus. EXAM: CT ABDOMEN AND PELVIS WITH CONTRAST TECHNIQUE: Multidetector CT imaging of the abdomen and pelvis was performed using the standard protocol following bolus administration of intravenous contrast. CONTRAST:  171mL OMNIPAQUE IOHEXOL 300 MG/ML  SOLN COMPARISON:  03/01/2020 FINDINGS: Lower chest: Bronchiectasis with parenchymal scarring noted in the lung  bases bilaterally. Hepatobiliary: No suspicious focal abnormality within the liver parenchyma. Gallbladder not visualized in either markedly decompressed or surgically absent. No intrahepatic or extrahepatic biliary dilation. Pancreas: No focal mass lesion. No dilatation of the main duct. No intraparenchymal cyst. No peripancreatic edema. Spleen: No splenomegaly. No focal mass lesion. Adrenals/Urinary Tract: No adrenal nodule or mass. Stable 9 mm low-density lesion upper pole right kidney, incompletely characterized but likely a cyst. Early excretion of contrast material noted in calices of both kidneys. No renal stone disease visible on the exam from 12 days ago. No evidence for  hydroureter. 2.6 x 1.3 cm region of apparent focal posterior bladder wall thickening (image 60/series 2) is indeterminate as this finding was not visible on the scan from 12 days ago. Although today it is highly suspicious for urothelial neoplasm, given that it appears new in the interval, layering debris/blood products is the probable etiology. Nevertheless, close follow-up recommended. Stomach/Bowel: Stomach is unremarkable. No gastric wall thickening. No evidence of outlet obstruction. Duodenum is normally positioned as is the ligament of Treitz. No small bowel wall thickening. No small bowel dilatation. The terminal ileum is normal. The appendix is normal. Wall thickening in the low rectum/anus again noted with heterogeneous enhancement, features compatible with the patient's known neoplasm. Vascular/Lymphatic: There is abdominal aortic atherosclerosis without aneurysm. There is no gastrohepatic or hepatoduodenal ligament lymphadenopathy. No retroperitoneal or mesenteric lymphadenopathy. No pelvic sidewall lymphadenopathy. Reproductive: Calcified fibroid noted in the uterus. There is no adnexal mass. Other: No intraperitoneal free fluid. Musculoskeletal: Soft tissue gas in the pelvic floor compatible with recent CT-guided bilateral pudendal nerve block. No worrisome lytic or sclerotic osseous abnormality. Multilevel compression deformity noted lower thoracic and lumbar spine. IMPRESSION: 1. 2.6 x 1.3 cm region of apparent focal posterior bladder wall thickening is indeterminate as this finding was not visible on the scan from 12 days ago. Although today, it has an appearance suspicious for urothelial neoplasm, given that it was not visible previously, layering debris/blood products is the probable etiology. Nevertheless, close follow-up recommended to ensure that it resolves. 2. No substantial change in the patient's known anal rectal mass. No evidence for metastatic disease in the abdomen or pelvis. 3. Soft  tissue gas in the pelvic floor compatible with recent CT-guided bilateral pudendal nerve block. 4. Bronchiectasis with parenchymal scarring in the lung bases bilaterally. 5. Aortic Atherosclerosis (ICD10-I70.0). Electronically Signed   By: Misty Stanley M.D.   On: 03/14/2020 05:30   CT ABDOMEN PELVIS W CONTRAST  Result Date: 03/01/2020 CLINICAL DATA:  Colorectal cancer, new anal mass, difficulty voiding EXAM: CT ABDOMEN AND PELVIS WITH CONTRAST TECHNIQUE: Multidetector CT imaging of the abdomen and pelvis was performed using the standard protocol following bolus administration of intravenous contrast. CONTRAST:  146mL OMNIPAQUE IOHEXOL 300 MG/ML  SOLN COMPARISON:  11/02/2019 FINDINGS: Lower chest: No acute abnormality. Fibrotic scarring of the included bilateral lung bases. Hepatobiliary: No focal liver abnormality is seen. Status post cholecystectomy. Postoperative biliary dilatation. Pancreas: Unremarkable. No pancreatic ductal dilatation or surrounding inflammatory changes. Spleen: Normal in size without significant abnormality. Adrenals/Urinary Tract: Adrenal glands are unremarkable. Kidneys are normal, without renal calculi, solid lesion, or hydronephrosis. Bladder is unremarkable. Stomach/Bowel: Stomach is within normal limits. Appendix appears normal. There is an irregular, rim enhancing soft tissue mass about the anus, containing air and fluid, measuring overall approximately 5.4 x 2.4 cm (series 2, image 76). No significant change in perirectal and presacral soft tissue stranding (series 2, image 67). Vascular/Lymphatic: Scattered aortic atherosclerosis. No enlarged  abdominal or pelvic lymph nodes. Reproductive: Small calcified uterine fibroids. Other: No abdominal wall hernia or abnormality. No abdominopelvic ascites. Musculoskeletal: No acute or significant osseous findings. Unchanged superior endplate deformities of T12 and L4. IMPRESSION: 1. There is an irregular, rim enhancing soft tissue mass  about the anus, containing air and fluid, measuring overall approximately 5.4 x 2.4 cm. Findings most suggestive of necrotic anorectal mass. Correlate with physical exam findings. 2. Unchanged fat stranding of the perirectal and presacral soft tissues, in keeping with prior radiation therapy. 3. No evidence of lymphadenopathy or metastatic disease in the abdomen or pelvis. 4. Aortic Atherosclerosis (ICD10-I70.0). Electronically Signed   By: Eddie Candle M.D.   On: 03/01/2020 13:57   DG Chest Port 1 View  Result Date: 03/09/2020 CLINICAL DATA:  Status post Port-A-Cath placement. EXAM: PORTABLE CHEST 1 VIEW COMPARISON:  May 04, 2018. FINDINGS: Stable cardiomegaly. Interval placement of right internal jugular Port-A-Cath with distal tip in expected position of the right brachiocephalic vein. No pneumothorax is noted. Mild bibasilar subsegmental atelectasis is noted. Bony thorax is unremarkable. IMPRESSION: Interval placement of right internal jugular Port-A-Cath with distal tip in expected position of the right brachiocephalic vein. Electronically Signed   By: Marijo Conception M.D.   On: 03/09/2020 09:25   DG C-Arm 1-60 Min-No Report  Result Date: 03/09/2020 Fluoroscopy was utilized by the requesting physician.  No radiographic interpretation.    ASSESSMENT AND PLAN: 1.  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 2.  Anemia 3.  HIV 4.  History of hepatitis C 5.  Hypertension 6.  Hyperparathyroidism 7.  Osteoporosis  -I again reviewed the benefit and risks of diverting colostomy, to the best of my knowledge, with patient in detail.  Patient asked about tumor removal, which is not feasible at this point. She voiced good understanding, and agrees to proceed with surgery this afternoon. -I tried to call her son, friend [name], and granddaughter, per pt's request, but was not able to reach any of them. -Pt would like to go home with hospice after  surgery, she has not changed her mind on this so far. I reviewed hospice service with her. -will f/u on Wednesday.    Truitt Merle  03/19/2020

## 2020-03-19 NOTE — Transfer of Care (Signed)
Immediate Anesthesia Transfer of Care Note  Patient: Vanessa Beard  Procedure(s) Performed: Procedure(s): LAPAROSCOPIC Ostomy Placement (N/A)  Patient Location: PACU  Anesthesia Type:General  Level of Consciousness: Alert, Awake, Oriented  Airway & Oxygen Therapy: Patient Spontanous Breathing  Post-op Assessment: Report given to RN  Post vital signs: Reviewed and stable  Last Vitals:  Vitals:   03/19/20 0534 03/19/20 1307  BP: 91/68 103/75  Pulse: 68 69  Resp: 16 16  Temp: 36.7 C 37.4 C  SpO2: 89% 34%    Complications: No apparent anesthesia complications

## 2020-03-19 NOTE — Anesthesia Procedure Notes (Signed)
Procedure Name: Intubation Date/Time: 03/19/2020 3:22 PM Performed by: Silas [place], CRNA Pre-anesthesia Checklist: Patient identified, Emergency Drugs available, Suction available and Patient being monitored Patient Re-evaluated:Patient Re-evaluated prior to induction Oxygen Delivery Method: Circle system utilized Preoxygenation: Pre-oxygenation with 100% oxygen Induction Type: IV induction and Rapid sequence Laryngoscope Size: Mac and 3 Grade View: Grade I Tube type: Oral Tube size: 7.0 mm Number of attempts: 1 Airway Equipment and Method: Stylet Placement Confirmation: ETT inserted through vocal cords under direct vision,  positive ETCO2 and breath sounds checked- equal and bilateral Secured at: 22 cm Tube secured with: Tape Dental Injury: Teeth and Oropharynx as per pre-operative assessment

## 2020-03-19 NOTE — Progress Notes (Signed)
PROGRESS NOTE    Vanessa Beard  TGG:[id number] DOB: January 21, 1951 DOA: 03/12/2020 PCP: Seward Carol, MD   Chief Complain: Rectal bleeding, pain  Brief Narrative:  Patient is a 69 year old African-American female with past medical history significant for  hypertension, HIV, hepatitis C status post treatment in 2018, squamous cell carcinoma of the anus.  Patient is currently on Keytruda.  Patient presented with severe anal pain.  Patient was discharged from the hospital on 03/11/2020.  On last admission, patient presented with anal pain and rectal bleeding, and was found to have necrotic anorectal mass.  General surgery and oncology were following.  Palliative care consulted for pain management and IR did CT-guided nerve block.  Hospital course was remarkable for significant pain requiring IV pain medications and prolonged hospital stay.  Patient has been readmitted with severe anal pain.  Patient underwent diversion colostomy today.   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   -Patient presented with rectal pain and bleeding.   -Local progression of disease on last admission.   -Continue pain management and supportive care.  -Palliative care team is following patient.   -IR did CT-guided nerve block on 10.19.21. -Continue bowel regimen to avoid constipation. -Abdominal/pelvis CT ordered during this admission did not show any significant change in the and anal mass. -Patient underwent diversion colostomy today.  Input from general surgery team is highly appreciated.  Normocytic anemia:  -Likely anemia of chronic illness.   -Stable.     HIV: -CD4 of 76.   -Intolerant to Bactrim, dapsone.   -Follows with ID.    Hypertension:  -Controlled.   Severe protein calorie malnutrition: Nutrition consulted  Debility/deconditioning: PT recommended SNF vs HH on last admission.  Patient expressed  interested on skilled nursing facility at this time but now is open for home with hospice ,we will discuss further about this after surgery.  Nutrition Problem: Severe Malnutrition Etiology: chronic illness, cancer and cancer related treatments      DVT prophylaxis:SCD Code Status: DnR Family Communication: Discussed with daughter on phone on 03/10/20 Status is: Inpatient  Remains inpatient appropriate because:Inpatient level of care appropriate due to severity of illness   Dispo: The patient is from: Home              Anticipated d/c is to:SnF vs Home with Hospice              Anticipated d/c date is: Not sure at this point              Patient currently is not medically stable to d/c. Patient awaiting for diverting colostomy   Consultants: Oncology, surgery  Procedures: None  Antimicrobials:  Anti-infectives (From admission, onward)   Start     Dose/Rate Route Frequency Ordered Stop   03/19/20 0715  cefoTEtan (CEFOTAN) 2 g in sodium chloride 0.9 % 100 mL IVPB        2 g 200 mL/hr over 30 Minutes Intravenous On call to O.R. 03/19/20 0623 03/19/20 1538   03/12/20 1000  [MAR Hold]  abacavir-dolutegravir-lamiVUDine (TRIUMEQ) 600-50-300 MG per tablet 1 tablet        (MAR Hold since  Mon 03/19/2020 at 1258.Hold Reason: Transfer to a Procedural area.)   1 tablet Oral Daily 03/12/20 0914        Subjective: No new complaint.  Objective: Vitals:   03/18/20 0619 03/18/20 1405 03/19/20 0534 03/19/20 1307  BP: 130/73 101/73 91/68 103/75  Pulse: 75 76 68 69  Resp: 16 15 16 16   Temp: 99.7 F (37.6 C) 98.4 F (36.9 C) 98 F (36.7 C) 99.3 F (37.4 C)  TempSrc: Oral Oral Oral Oral  SpO2: 100% 96% 93% 97%  Weight:       Height:        Intake/Output Summary (Last 24 hours) at 03/19/2020 1719 Last data filed at 03/19/2020 1718 Gross per 24 hour  Intake 1600 ml  Output 50 ml  Net 1550 ml   Filed Weights   03/12/20 0550  Weight: 58.5 kg    Examination:   General exam: Chronically ill looking.  Cachectic.   HEENT: Pallor. Respiratory system: Clear to auscultations.   Cardiovascular system: S1 & S2 heard Gastrointestinal system: Abdomen is nondistended, soft and nontender. No organomegaly or masses felt. Normal bowel sounds heard. Central nervous system: Awake and alert.   Extremities: No edema.  Data Reviewed: I have personally reviewed following labs and imaging studies  CBC: Recent Labs  Lab 03/15/20 0425  WBC 7.2  NEUTROABS 6.0  HGB 9.4*  HCT 29.6*  MCV 101.0*  PLT 119*   Basic Metabolic Panel: Recent Labs  Lab 03/19/20 0456  CREATININE 0.61   GFR: Estimated Creatinine Clearance: 51.6 mL/min (by C-G formula based on SCr of 0.61 mg/dL). Liver Function Tests: No results for input(s): AST, ALT, ALKPHOS, BILITOT, PROT, ALBUMIN in the last 168 hours. No results for input(s): LIPASE, AMYLASE in the last 168 hours. No results for input(s): AMMONIA in the last 168 hours. Coagulation Profile: No results for input(s): INR, PROTIME in the last 168 hours. Cardiac Enzymes: No results for input(s): CKTOTAL, CKMB, CKMBINDEX, TROPONINI in the last 168 hours. BNP (last 3 results) No results for input(s): PROBNP in the last 8760 hours. HbA1C: No results for input(s): HGBA1C in the last 72 hours. CBG: No results for input(s): GLUCAP in the last 168 hours. Lipid Profile: No results for input(s): CHOL, HDL, LDLCALC, TRIG, CHOLHDL, LDLDIRECT in the last 72 hours. Thyroid Function Tests: No results for input(s): TSH, T4TOTAL, FREET4, T3FREE, THYROIDAB in the last 72 hours. Anemia Panel: No results for input(s): VITAMINB12, FOLATE, FERRITIN, TIBC, IRON, RETICCTPCT in the last 72  hours. Sepsis Labs: No results for input(s): PROCALCITON, LATICACIDVEN in the last 168 hours.  Recent Results (from the past 240 hour(s))  Respiratory Panel by RT PCR (Flu A&B, Covid) - Nasopharyngeal Swab     Status: None   Collection Time: 03/12/20  7:17 AM   Specimen: Nasopharyngeal Swab  Result Value Ref Range Status   SARS Coronavirus 2 by RT PCR NEGATIVE NEGATIVE Final    Comment: (NOTE) SARS-CoV-2 target nucleic acids are NOT DETECTED.  The SARS-CoV-2 RNA is generally detectable in upper respiratoy specimens during the acute phase of infection. The lowest concentration of SARS-CoV-2 viral copies this assay can detect is 131 copies/mL. A negative result does not preclude SARS-Cov-2 infection and should not be used as the sole basis for treatment or other patient management decisions. A negative result may occur with  improper specimen collection/handling, submission of specimen other than nasopharyngeal swab, presence of viral mutation(s) within the areas targeted by this assay, and inadequate number of viral copies (<  131 copies/mL). A negative result must be combined with clinical observations, patient history, and epidemiological information. The expected result is Negative.  Fact Sheet for Patients:  PinkCheek.be  Fact Sheet for Healthcare Providers:  GravelBags.it  This test is no t yet approved or cleared by the [place] FDA and  has been authorized for detection and/or diagnosis of SARS-CoV-2 by FDA under an Emergency Use Authorization (EUA). This EUA will remain  in effect (meaning this test can be used) for the duration of the COVID-19 declaration under Section 564(b)(1) of the Act, 21 U.S.C. section 360bbb-3(b)(1), unless the authorization is terminated or revoked sooner.     Influenza A by PCR NEGATIVE NEGATIVE Final   Influenza B by PCR NEGATIVE NEGATIVE Final    Comment: (NOTE) The Xpert Xpress  SARS-CoV-2/FLU/RSV assay is intended as an aid in  the diagnosis of influenza from Nasopharyngeal swab specimens and  should not be used as a sole basis for treatment. Nasal washings and  aspirates are unacceptable for Xpert Xpress SARS-CoV-2/FLU/RSV  testing.  Fact Sheet for Patients: PinkCheek.be  Fact Sheet for Healthcare Providers: GravelBags.it  This test is not yet approved or cleared by the [place] FDA and  has been authorized for detection and/or diagnosis of SARS-CoV-2 by  FDA under an Emergency Use Authorization (EUA). This EUA will remain  in effect (meaning this test can be used) for the duration of the  Covid-19 declaration under Section 564(b)(1) of the Act, 21  U.S.C. section 360bbb-3(b)(1), unless the authorization is  terminated or revoked. Performed at [facility], [place] [address], [place],  [address]          Radiology Studies: No results found.      Scheduled Meds: . [MAR Hold] abacavir-dolutegravir-lamiVUDine  1 tablet Oral Daily  . [MAR Hold] dexamethasone  2 mg Oral Daily  . [MAR Hold] DULoxetine  20 mg Oral Daily  . [MAR Hold] enoxaparin (LOVENOX) injection  40 mg Subcutaneous Daily  . [MAR Hold] fentaNYL  1 patch Transdermal Q72H  . [MAR Hold] gabapentin  600 mg Oral BID  . [MAR Hold] lactose free nutrition  237 mL Oral TID WC  . [MAR Hold] liver oil-zinc oxide   Topical BID  . [MAR Hold] polyethylene glycol  17 g Oral Daily  . [MAR Hold] potassium chloride SA  20 mEq Oral Daily  . [MAR Hold] senna-docusate  1 tablet Oral BID  . [MAR Hold] sucralfate  1 g Oral BID   Continuous Infusions: . lactated ringers 20 mL/hr at 03/19/20 1511     LOS: 7 days    Time spent: 25 mins.     Bonnell Public, MD [facility] P11/05/2019, 5:19 PM

## 2020-03-19 NOTE — Op Note (Signed)
03/12/2020 - 03/19/2020  5:12 PM  PATIENT:  Vanessa Beard  69 y.o. female  Patient Care Team: Seward Carol, MD as PCP - General (Internal Medicine) Carlyle Basques, MD as PCP - Infectious Diseases (Infectious Diseases) Binnie Rail, DC as Referring Physician (Chiropractic Medicine) Jonnie Finner, RN as Oncology Nurse Navigator  PRE-OPERATIVE DIAGNOSIS:  ANAL SQUAMOUS CELL CARCINOMA  POST-OPERATIVE DIAGNOSIS:  ANAL SQUAMOUS CELL CARCINOMA  PROCEDURE:   LAPAROSCOPIC Ostomy Placement    Surgeon(s): Leighton Ruff, MD  ASSISTANT: Alferd Apa, PA   ANESTHESIA:   local and general  EBL: 24ml  Total I/O In: 100 [IV Piggyback:100] Out: 28 [Blood:50]  DRAINS: none   SPECIMEN:  No Specimen  DISPOSITION OF SPECIMEN:  N/A  COUNTS:  YES  PLAN OF CARE: Pt already admitted  PATIENT DISPOSITION:  PACU - hemodynamically stable.  INDICATION: partially obstructive, non-operative, recurrent anal cancer   OR FINDINGS: umbilical hernia, pelvic adhesions  DESCRIPTION: the patient was identified in the preoperative holding area and taken to the OR where they were laid supine on the operating room table.  General anesthesia was induced without difficulty. SCDs were also noted to be in place prior to the initiation of anesthesia.  The patient was then prepped and draped in the usual sterile fashion.   A surgical timeout was performed indicating the correct patient, procedure, positioning and need for preoperative antibiotics.  I began by using a varies needle in the left upper quadrant with the patient tilted in appropriate position to gain access into the abdomen.  Once safely in the abdomen, the Veress needle was insufflated to 15 mmHg.  I then placed a 5 mm port in the right upper quadrant.  Direct camera observation showed no sign of injury at either entry point.  I began by mobilizing the omentum out of the abdominal wall using electrocautery and blunt dissection.  This was  then placed back into the upper abdomen.  Patient was then placed with right side down and head down.  I began to mobilize the sigmoid colon off of the lateral sidewall attachments.  There was a portion of sigmoid that was adherent to the left ovary.  This was taken down using sharp dissection.  Hemostasis was good.  This allowed for mobilization to the abdominal wall.  I attempted to bring up a loop colostomy but there was not enough mobility in the mesentery and therefore I decided to convert to a [employer].  I used a 75 mm GIA stapler to transect the colon but leave the mesentery intact.  I then brought out the proximal colon as an end colostomy.  I brought out a corner of the distal colon staple line as well.  I inspected the abdomen for hemostasis.  There was no sign of active bleeding.  The abdomen was desufflated.  Ports were removed and the 5 mm port sites were closed using interrupted 4-0 Vicryl and Dermabond.  The colostomy was then matured in standard Brooke fashion using interrupted 2-0 Vicryl sutures.  The corner of the distal limb of the colon was removed and the edges were sutured to the medial aspect of the dermis at the ostomy site to allow for drainage into the ostomy if she becomes distally obstructed.  Patency was confirmed of both limbs.  An ostomy appliance was placed.  The patient was then awakened from anesthesia and sent to the post anesthesia care unit in stable condition.  All counts were correct per operating room staff.

## 2020-03-19 NOTE — Progress Notes (Signed)
S: no issues overnight O: BP 103/75   Pulse 69   Temp 99.3 F (37.4 C) (Oral)   Resp 16   Ht 5' (1.524 m)   Wt 58.5 kg   SpO2 97%   BMI 25.19 kg/m  Gen: NAD Neuro: AOx4 Abd: soft, NT, ND  A/P 69 yo female with advance SCC of anus readmitted for persistent perianal pain not responsive to nerve block. She has incontinence worse now. -discussed plan for fecal diversion again. We discussed details of laparoscopic attempt, identifying a mobile portion of colon and bringing it up to the ostomy through the abdominal wall. We discussed risks of leak, infection, bleeding, ostomy issues, need for larger procedure, and injury to surrounding tissues. She had questions of why surgery would not remove the cancer which was explained as to greater morbidity without prognostic improvement.  We discussed that this surgery is not likely to relieve her pain

## 2020-03-19 NOTE — Care Management Important Message (Signed)
Important Message  Patient Details IM Letter given to the Patient Name: Vanessa Beard MRN: [id number] Date of Birth: July 22, 1950   Medicare Important Message Given:  Yes     [name] 03/19/2020, 9:17 AM

## 2020-03-20 ENCOUNTER — Encounter (HOSPITAL_COMMUNITY): Payer: Self-pay | Admitting: General Surgery

## 2020-03-20 ENCOUNTER — Ambulatory Visit (HOSPITAL_COMMUNITY): Payer: Medicare HMO

## 2020-03-20 DIAGNOSIS — I10 Essential (primary) hypertension: Secondary | ICD-10-CM | POA: Diagnosis not present

## 2020-03-20 DIAGNOSIS — B182 Chronic viral hepatitis C: Secondary | ICD-10-CM

## 2020-03-20 DIAGNOSIS — G893 Neoplasm related pain (acute) (chronic): Secondary | ICD-10-CM

## 2020-03-20 DIAGNOSIS — K6289 Other specified diseases of anus and rectum: Secondary | ICD-10-CM | POA: Diagnosis not present

## 2020-03-20 DIAGNOSIS — Z515 Encounter for palliative care: Secondary | ICD-10-CM | POA: Diagnosis not present

## 2020-03-20 DIAGNOSIS — C210 Malignant neoplasm of anus, unspecified: Secondary | ICD-10-CM | POA: Diagnosis not present

## 2020-03-20 LAB — BASIC METABOLIC PANEL
Anion gap: 9 (ref 5–15)
BUN: 16 mg/dL (ref 8–23)
CO2: 21 mmol/L — ABNORMAL LOW (ref 22–32)
Calcium: 9.2 mg/dL (ref 8.9–10.3)
Chloride: 104 mmol/L (ref 98–111)
Creatinine, Ser: 0.71 mg/dL (ref 0.44–1.00)
GFR, Estimated: >60 mL/min (ref >60)
Glucose, Bld: 117 mg/dL — ABNORMAL HIGH (ref 70–99)
Potassium: 4.9 mmol/L (ref 3.5–5.1)
Sodium: 134 mmol/L — ABNORMAL LOW (ref 135–145)

## 2020-03-20 LAB — CBC
HCT: 25 % — ABNORMAL LOW (ref 36.0–46.0)
Hemoglobin: 8.1 g/dL — ABNORMAL LOW (ref 12.0–15.0)
MCH: 31.8 pg (ref 26.0–34.0)
MCHC: 32.4 g/dL (ref 30.0–36.0)
MCV: 98 fL (ref 80.0–100.0)
Platelets: 150 10*3/uL (ref 150–400)
RBC: 2.55 MIL/uL — ABNORMAL LOW (ref 3.87–5.11)
RDW: 18.5 % — ABNORMAL HIGH (ref 11.5–15.5)
WBC: 6.4 10*3/uL (ref 4.0–10.5)
nRBC: 0 % (ref 0.0–0.2)

## 2020-03-20 MED ORDER — SODIUM CHLORIDE 0.9 % IV BOLUS
500.0000 mL | Freq: Once | INTRAVENOUS | Status: AC
  Administered 2020-03-20: 500 mL via INTRAVENOUS

## 2020-03-20 MED ORDER — CHLORHEXIDINE GLUCONATE CLOTH 2 % EX PADS
6.0000 | MEDICATED_PAD | Freq: Every day | CUTANEOUS | Status: DC
  Administered 2020-03-20 – 2020-03-27 (×3): 6 via TOPICAL

## 2020-03-20 MED ORDER — LIP MEDEX EX OINT
TOPICAL_OINTMENT | CUTANEOUS | Status: AC
  Administered 2020-03-20: 1
  Filled 2020-03-20: qty 7

## 2020-03-20 MED ORDER — HYDROMORPHONE HCL 2 MG PO TABS
4.0000 mg | ORAL_TABLET | ORAL | Status: DC | PRN
  Administered 2020-03-20 – 2020-03-26 (×15): 4 mg via ORAL
  Filled 2020-03-20 (×16): qty 2

## 2020-03-20 NOTE — Progress Notes (Addendum)
1 Day Post-Op  Subjective: CC: Patient reports soreness around the ostomy. No output since surgery. Tolerating cld, has not tried fld options. No n/v. Has gotten to the edge of bed but not mobilized since surgery. Foley in place.   Objective: Vital signs in last 24 hours: Temp:  [97.7 F (36.5 C)-99.3 F (37.4 C)] 98 F (36.7 C) (11/02 0532) Pulse Rate:  [69-90] 77 (11/02 0532) Resp:  [10-18] 16 (11/02 0532) BP: (91-135)/(53-87) 114/76 (11/02 0532) SpO2:  [91 %-100 %] 100 % (11/02 0532) Last BM Date: 03/18/20  Intake/Output from previous day: 11/01 0701 - 11/02 0700 In: 2065.3 [P.O.:360; I.V.:1605.3; IV Piggyback:100] Out: 600 [Urine:550; Blood:50] Intake/Output this shift: No intake/output data recorded.  PE: Gen:  Alert, NAD, pleasant Pulm: Normal rate and effort  Abd: Soft, ND, +BS, colostomy in place with some air and sweat in bag. Stoma dark red and slightly budded. Some tenderness around colostomy, otherwise NT.   Lab Results:  No results for input(s): WBC, HGB, HCT, PLT in the last 72 hours. BMET Recent Labs    03/19/20 0456  CREATININE 0.61   PT/INR No results for input(s): LABPROT, INR in the last 72 hours. CMP     Component Value Date/Time   NA 139 03/12/2020 0626   NA 142 08/31/2013 1515   K 3.7 03/12/2020 0626   K 3.4 (L) 08/31/2013 1515   CL 102 03/12/2020 0626   CL 104 07/07/2012 1512   CO2 28 03/12/2020 0626   CO2 27 08/31/2013 1515   GLUCOSE 102 (H) 03/12/2020 0626   GLUCOSE 69 (L) 08/31/2013 1515   GLUCOSE 88 07/07/2012 1512   BUN 20 03/12/2020 0626   BUN 14.7 08/31/2013 1515   CREATININE 0.61 03/19/2020 0456   CREATININE 0.82 02/17/2020 0824   CREATININE 0.87 08/08/2019 1535   CREATININE 0.8 08/31/2013 1515   CALCIUM 9.7 03/12/2020 0626   CALCIUM 9.6 08/31/2013 1515   PROT 7.5 03/03/2020 1545   PROT 8.8 (H) 08/31/2013 1515   ALBUMIN 2.6 (L) 03/03/2020 1545   ALBUMIN 3.7 08/31/2013 1515   AST 23 03/03/2020 1545   AST 22  02/17/2020 0824   AST 25 08/31/2013 1515   ALT 12 03/03/2020 1545   ALT 8 02/17/2020 0824   ALT 9 08/31/2013 1515   ALKPHOS 73 03/03/2020 1545   ALKPHOS 99 08/31/2013 1515   BILITOT 0.7 03/03/2020 1545   BILITOT 0.7 02/17/2020 0824   BILITOT 0.67 08/31/2013 1515   GFRNONAA >60 03/19/2020 0456   GFRNONAA >60 02/17/2020 0824   GFRNONAA 67 08/08/2019 1535   GFRAA >60 02/17/2020 0824   GFRAA 78 08/08/2019 1535   Lipase     Component Value Date/Time   LIPASE 25 11/02/2019 0030       Studies/Results: No results found.  Anti-infectives: Anti-infectives (From admission, onward)   Start     Dose/Rate Route Frequency Ordered Stop   03/19/20 0715  cefoTEtan (CEFOTAN) 2 g in sodium chloride 0.9 % 100 mL IVPB        2 g 200 mL/hr over 30 Minutes Intravenous On call to O.R. 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  Marcello Moores - 03/19/2020 - POD #1 - WOCN consult  - Mobilize - FLD - AROBF - Pulm toilet   FEN - FLD VTE - SCDs, Lovenox  ID - cefotetan periop Foley - d/c     LOS: 8 days    Jillyn Ledger , [facility] Surgery 03/20/2020, 7:57 AM Please see Amion for pager number during day hours 7:00am-4:30pm

## 2020-03-20 NOTE — Progress Notes (Signed)
Daily Progress Note   Patient Name: Vanessa Beard       Date: 03/20/2020 DOB: [birth year]  Age: 69 y.o. MRN#: [id number] Attending Physician: Tawni Millers Primary Care Physician: Seward Carol, MD Admit Date: 03/12/2020  Reason for Consultation/Follow-up: Establishing goals of care  Subjective: Vanessa Beard was awake, alert, and reports having a very good day today.  Her friend, [name], was at the bedside.  Her daughter also called in via [place] and had questions regarding exactly what was done in surgery yesterday.  I reviewed ostomy placement with her and family and we discussed the fact that she had diverting ostomy and goal of surgery was not to remove any tumor.  Vanessa Beard reports that she has been having fairly good control of pain since yesterday.  MAR reveals that she has had 3 doses of IV Dilaudid in the last 24 hours.  Her daughter states that she will be out of town until November 8 and cannot be reached by cell phone as she is out of the [place].  She requests that family friend, Glynn Octave, be contacted for any needs.  Her daughter also states that she thinks that her mom may need to transition to skilled facility at least until she returns as she will be providing most of the caregiving when she returns home.  Patient and her friend, [name], then report that their goal is for her to transition home at discharge.  [name] states that she can stay with patient until her daughter returns.  We then discussed plan to continue with current interventions and see how she progresses with PT evaluation, return of bowel function, and pain management over the next day or two.  Discussed that we can reevaluate plan for discharge of either skilled facility or home with hospice once  we have a better idea of how much she is able to participate in her own care and we have a better idea of her overall pain management needs.     Length of Stay: 8  Current Medications: Scheduled Meds:  . abacavir-dolutegravir-lamiVUDine  1 tablet Oral Daily  . Chlorhexidine Gluconate Cloth  6 each Topical Daily  . DULoxetine  20 mg Oral Daily  . enoxaparin (LOVENOX) injection  40 mg Subcutaneous Daily  . fentaNYL  1 patch Transdermal Q72H  .  gabapentin  600 mg Oral BID  . lactose free nutrition  237 mL Oral TID WC  . liver oil-zinc oxide   Topical BID  . potassium chloride SA  20 mEq Oral Daily  . senna-docusate  1 tablet Oral BID  . sucralfate  1 g Oral BID    Continuous Infusions:   PRN Meds: acetaminophen, belladonna-opium, butamben-tetracaine-benzocaine, HYDROmorphone (DILAUDID) injection, HYDROmorphone, polyethylene glycol, zolpidem  Physical Exam         No distress.  Awake and alert and interactive.  Pleasant and in a good mood, smiling today Awake alert Regular work of breathing Abdomen not distended, deferred ostomy exam Non focal No edema S1 S 2   Vital Signs: BP 108/76 (BP Location: Left Arm)   Pulse 89   Temp 99.2 F (37.3 C) (Oral)   Resp 16   Ht 5' (1.524 m)   Wt 58.5 kg   SpO2 99%   BMI 25.19 kg/m  SpO2: SpO2: 99 % O2 Device: O2 Device: Room Air O2 Flow Rate: O2 Flow Rate (L/min): 2 L/min  Intake/output summary:   Intake/Output Summary (Last 24 hours) at 03/20/2020 1014 Last data filed at 03/20/2020 0908 Gross per 24 hour  Intake 2305.33 ml  Output 600 ml  Net 1705.33 ml   LBM: Last BM Date: 03/18/20 Baseline Weight: Weight: 58.5 kg Most recent weight: Weight: 58.5 kg      PPS 50% Palliative Assessment/Data:    Flowsheet Rows     Most Recent Value  Intake Tab  Referral Department Hospitalist  Unit at Time of Referral Oncology Unit  Palliative Care Primary Diagnosis Cancer  Date Notified 03/13/20  Palliative Care Type Return patient  Palliative Care  Reason for referral Pain  Date of Admission 03/12/20  Date first seen by Palliative Care 03/13/20  # of days Palliative referral response time 0 Day(s)  # of days IP prior to Palliative referral 1  Clinical Assessment  Psychosocial & Spiritual Assessment  Palliative Care Outcomes      Patient Active Problem List   Diagnosis Date Noted  . Palliative care by specialist   . General weakness   . Protein-calorie malnutrition, severe 03/05/2020  . Palliative care encounter 01/27/2020  . Goals of care, counseling/discussion 01/16/2020  . Hemorrhoid 12/16/2019  . Rectal pain 12/16/2019  . Anal cancer ([place]) 07/26/2019  . Rectal mass 07/14/2019  . Constipation 07/14/2019  . Anal fissure 07/14/2019  . Healthcare maintenance 06/30/2018  . Left knee pain 12/30/2017  . Left shoulder pain 12/30/2017  . Closed fracture of right tibial plateau 02/18/2017  . Benign hypertension 02/15/2017  . Screening examination for venereal disease 03/06/2015  . Encounter for long-term (current) use of medications 03/06/2015  . Elevated LFTs 12/11/2014  . Abnormal ultrasound 12/11/2014  . Abdominal pain, epigastric 12/11/2014  . ASCUS of cervix with negative high risk HPV 10/31/2013  . Gall bladder disease 02/03/2013  . 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  . Compression fracture of L3 lumbar vertebra 07/01/2011  . Thyroid dysfunction 07/01/2011  . Anemia 07/01/2011  . Sore throat 04/02/2011  . Hyperparathyroidism 02/10/2011  . Osteopenia 02/03/2011  . PAP SMEAR, LGSIL, ABNORMAL 12/21/2009  . COUGH 07/18/2009  . Chronic hepatitis C virus infection ([place]) 12/19/2008  . LEUKORRHEA 04/07/2008  . FATIGUE 04/07/2008  . BACK PAIN, LUMBAR 11/10/2007  . ALLERGIC RHINITIS 07/09/2007  . PNEUMOCYSTIS PNEUMONIA 05/07/2007  . Essential hypertension 05/07/2007  . DENTAL CARIES 05/07/2007  . INSOMNIA 05/07/2007  . PNEUMONIA, HX  OF 05/07/2007  . HERPES ZOSTER, HX OF  05/07/2007  . Human immunodeficiency virus (HIV) disease ([place]) 08/04/2006    Palliative Care Assessment & Plan   Patient Profile:    Assessment:  squamous cell ca anus Severe pain Necrotic anorectal mass Has history of HIV HTN  Recommendations/Plan:  Status post diverting ostomy yesterday.  Continue current management and await evaluation from PT and see how she does clinically over the next 24 to 48 hours while working to determine care venue on discharge.  Options family would like to consider include home with hospice (patient reports this is her preference) versus short-term rehab to attempt to regain functional status.  Continue current mode of care. Total of 3 mg of IV Dilaudid in the past 24 hours.  I did increase her p.o. rescue medication to 4 mg of oral Dilaudid every 3 hours as needed.  Discussed need to transition from oral rescue medication in order to better determine discharge venue.  She will work to transition over to oral rescue medication over the next day or so.  She is postop day 1 today and I do not think is unreasonable for her to continue to use IV medications throughout the day today.    Code Status:    Code Status Orders  (From admission, onward)         Start     Ordered   03/12/20 0940  Do not attempt resuscitation (DNR)  Continuous       Question Answer Comment  In the event of cardiac or respiratory ARREST Do not call a "code blue"   In the event of cardiac or respiratory ARREST Do not perform Intubation, CPR, defibrillation or ACLS   In the event of cardiac or respiratory ARREST Use medication by any route, position, wound care, and other measures to relive pain and suffering. May use oxygen, suction and manual treatment of airway obstruction as needed for comfort.      03/12/20 0939        Code Status History    Date Active Date Inactive Code Status Order ID Comments User Context   03/03/2020 1955 03/11/2020 2230 DNR [id number]  Lenore Cordia, MD ED   02/15/2017 2327 02/21/2017 1728 Full Code [id number]  Jani Gravel, MD Inpatient   Advance Care Planning Activity       Prognosis:   Unable to determine  Discharge Planning:  To Be Determined  Care plan was discussed with  Patient. Daughter via facetime, friend carol at bedside     Thank you for allowing the Palliative Medicine Team to assist in the care of this patient.   Time In: 0930 Time Out: 1010 Total Time 40 Prolonged Time Billed  no    Greater than 50%  of this time was spent counseling and coordinating care related to the above assessment and plan.  Micheline Rough, MD  Please contact Palliative Medicine Team phone at [phone] for questions and concerns.

## 2020-03-20 NOTE — Consult Note (Signed)
[place] Nurse ostomy consult note  Request for consult received for new ostomy created emergently on 03/19/20 Marcello Moores). [facility] to see tomorrow, 03/21/20 for stoma assessment and to begin teaching.  [place] nursing team will follow, but will remain available to this patient, the nursing, surgical and medical teams.  Thanks, Maudie Flakes, MSN, RN, [place], Arther Abbott  Pager# [phone]

## 2020-03-20 NOTE — Progress Notes (Signed)
Nutrition Follow-up  DOCUMENTATION CODES:   Severe malnutrition in context of chronic illness  INTERVENTION:   -Boost Plus TID- Each supplement provides 360kcal and 14g protein.   -Magic cup BID with meals, each supplement provides 290 kcal and 9 grams of protein   NUTRITION DIAGNOSIS:   Severe Malnutrition related to chronic illness, cancer and cancer related treatments as evidenced by percent weight loss, severe fat depletion, severe muscle depletion.  Ongoing.  GOAL:   Patient will meet greater than or equal to 90% of their needs  Progressing.  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 Admitted for severe anal pain.  11/1: s/p lap ostomy placement  Patient's diet now on full liquids. Pt mainly consuming clears. Accepted Boost this morning.   Admission weight: 129 lbs.  No new weights for this admission.  Medications: KLOR-CON, Senokot, Carafate   Labs reviewed: Low Na  Diet Order:   Diet Order            Diet full liquid Room service appropriate? Yes; Fluid consistency: Thin  Diet effective now                 EDUCATION NEEDS:   No education needs have been identified at this time  Skin:  Skin Assessment: Reviewed RN Assessment  Last BM:  10/26  Height:   Ht Readings from Last 1 Encounters:  03/12/20 5' (1.524 m)    Weight:   Wt Readings from Last 1 Encounters:  03/12/20 58.5 kg   BMI:  Body mass index is 25.19 kg/m.  Estimated Nutritional Needs:   Kcal:  1700-1900  Protein:  80-90g  Fluid:  1.9L/day  Clayton Bibles, MS, RD, LDN Inpatient Clinical Dietitian  Contact information available via [place]

## 2020-03-20 NOTE — Progress Notes (Signed)
Physical Therapy Treatment Patient Details Name: Vanessa Beard MRN: [id number] DOB: 03-04-1951 Today's Date: 03/20/2020    History of Present Illness 69 year old female with PMH including HTN, HIV,  malnutrition and anal squamous cell carcinoma on chemotherapy who previously was admitted on 10/16 for rectal bleeding and pain and found to have a necrotic anal rectal mass.  patient opted for conservative management. It was recommended she go to a skilled nursing facility, but patient declined this and she was discharged 10/24. 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     PT Comments    Pt transferred bed to recliner, then back to bed as she was unable to tolerate sitting 2* anal pain. Increased time for all activity, pt rated pain as 10/10.   Follow Up Recommendations  SNF     Equipment Recommendations  None recommended by PT    Recommendations for Other Services OT consult     Precautions / Restrictions Precautions Precautions: Fall Precaution Comments: new ostomy Restrictions Weight Bearing Restrictions: No    Mobility  Bed Mobility Overal bed mobility: Needs Assistance Bed Mobility: Supine to Sit;Sit to Supine Rolling: Supervision Sidelying to sit: Min assist;HOB elevated Supine to sit: Min guard Sit to supine: Supervision Sit to sidelying: Min assist General bed mobility comments: increased time and effort - verbal cues for log roll technique, min A to raise trunk, then min A for LEs into bed  Transfers Overall transfer level: Needs assistance Equipment used: Rolling walker (2 wheeled) Transfers: Sit to/from [facility] Sit to Stand: Min guard;From elevated surface Stand pivot transfers: Min guard;From elevated surface       General transfer comment: bed to recliner, then back to bed. Pt unable to tolerate sitting  in recliner 2* anal pain. Increased time for all movement.  Ambulation/Gait                 Stairs             Wheelchair Mobility    Modified Rankin (Stroke Patients Only)       Balance Overall balance assessment: Needs assistance Sitting-balance support: No upper extremity supported Sitting balance-Leahy Scale: Fair     Standing balance support: Single extremity supported;Bilateral upper extremity supported;During functional activity Standing balance-Leahy Scale: Poor Standing balance comment: dependent on at least  UE support                            Cognition Arousal/Alertness: Awake/alert Behavior During Therapy: WFL for tasks assessed/performed Overall Cognitive Status: Within Functional Limits for tasks assessed                                        Exercises      General Comments General comments (skin integrity, edema, etc.): Pt's friend present throughout session      Pertinent Vitals/Pain Pain Assessment: 0-10 Pain Score: 10-Worst pain ever Faces Pain Scale: Hurts even more Pain Location: colostomy site Pain Descriptors / Indicators: Grimacing;Guarding;Sore Pain Intervention(s): Limited activity within patient's tolerance;Monitored during session;Patient requesting pain meds-RN notified;Repositioned    Home Living                      Prior Function  PT Goals (current goals can now be found in the care plan section) Acute Rehab PT Goals Patient Stated Goal: pt states she is unsure PT Goal Formulation: With patient Time For Goal Achievement: 03/26/20 Potential to Achieve Goals: Fair Progress towards PT goals: Progressing toward goals    Frequency    Min 2X/week      PT Plan Current plan remains appropriate    Co-evaluation              AM-PAC PT "6 Clicks" Mobility   Outcome Measure  Help needed turning from your back to your side while in a flat bed without using  bedrails?: A Little Help needed moving from lying on your back to sitting on the side of a flat bed without using bedrails?: A Little Help needed moving to and from a bed to a chair (including a wheelchair)?: A Little Help needed standing up from a chair using your arms (e.g., wheelchair or bedside chair)?: A Little Help needed to walk in hospital room?: A Lot Help needed climbing 3-5 steps with a railing? : A Lot 6 Click Score: 16    End of Session   Activity Tolerance: Patient limited by pain Patient left: in bed;with call bell/phone within reach;with bed alarm set Nurse Communication: Mobility status PT Visit Diagnosis: Pain;Difficulty in walking, not elsewhere classified (R26.2)     Time: 1340-1404 PT Time Calculation (min) (ACUTE ONLY): 24 min  Charges:  $Therapeutic Activity: 23-37 mins                     Blondell Reveal Kistler PT 03/20/2020  Acute Rehabilitation Services Pager [phone] Office [phone]

## 2020-03-20 NOTE — Progress Notes (Signed)
PROGRESS NOTE    Vanessa Beard  UKG:[id number] DOB: 1951-01-22 DOA: 03/12/2020 PCP: Vanessa Carol, MD    Brief Narrative:  Vanessa Beard was admitted to the hospital with a working diagnosis of worsening anorectal squamous cell carcinoma.  69 year old female with past medical history for hypertension, HIV, severe protein calorie malnutrition and anorectal squamous cell carcinoma.  Patient was readmitted 24 hours after discharge from the hospital.  On that prior hospitalization patient was diagnosed with necrotic anal rectal mass, she declined palliative diverting colostomy.  She underwent IR guided nerve block and was discharged with oral analgesics.  At home her pain became intractable that prompted her to come back to the hospital. On her initial physical examination blood pressure 121/79, heart rate 62, temperature 98.2, respiratory rate 11, oxygen saturation 96%, her lungs are clear to auscultation bilaterally, abdomen was soft nontender, no lower extremity edema.  Patient has been receiving analgesics. Surgery was consulted and she underwent palliative diverting colostomy 03/19/2020.  Palliative care services have been consulted, patient will be discharged either home with home hospice or to rehab to attempt to regain functional status.  Assessment & Plan:   Principal Problem:   Rectal pain Active Problems:   Human immunodeficiency virus (HIV) disease ([facility])   Chronic hepatitis C virus infection ([place])   Essential hypertension   Anal cancer ([place])   Protein-calorie malnutrition, severe   Palliative care by specialist   General weakness   1. 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 SP laparoscopic ostomy placement on 11/01.  Hgb is 8,1 with Hct at 25.   Continue to be very weak and deconditioned, her pain is better controlled with current regimen but not yet back to baseline.   Continue with fentanyl patch 50 mcg, and as needed hydromorphone. On  gabapentin 600 mg bid. Follow with Dr Domingo Cocking recommendations from palliative care.   Possible home hospice or SNF at discharge. She has both children in town.   2. HIV. Continue antiretroviral therapy with good toleration.   3. Depression. Continue with duloxetine.   4. Severe calorie protein malnutrition. Patient with poor oral intake, continue with supplements. Poor prognosis.  Currently patient on full liquid diet.   5. Hypokalemia. Stable renal function and electrolytes. K is 4,9 and bicarbonate at 21 with cr at 0,71. Will hold on K supplementation for now.   Status is: Inpatient  Remains inpatient appropriate because:Inpatient level of care appropriate due to severity of illness   Dispo: The patient is from: Home              Anticipated d/c is to: SNF              Anticipated d/c date is: 2 days              Patient currently is not medically stable to d/c.   DVT prophylaxis: scd  Code Status:   dnr   Family Communication:  No family at the bedside      Nutrition Status: Nutrition Problem: Severe Malnutrition Etiology: chronic illness, cancer and cancer related treatments Signs/Symptoms: percent weight loss, severe fat depletion, severe muscle depletion Percent weight loss: 16 % (x 2.5 months) Interventions: Boost Plus, MVI, Magic cup      Consultants:   Surgery  Palliative care   Procedures:   Ostomy placement 11/01   Antimicrobials:       Subjective: Patient continue to have abdominal pain, dull in nature at the right side of the abdomen,  improved in intensity but not yet back to normal, no nausea or vomiting.   Objective: Vitals:   03/20/20 0118 03/20/20 0532 03/20/20 0907 03/20/20 1344  BP: 91/62 114/76 108/76 96/63  Pulse: 83 77 89 82  Resp: 15 16 16 18   Temp: 97.8 F (36.6 C) 98 F (36.7 C) 99.2 F (37.3 C) 99.8 F (37.7 C)  TempSrc: Oral Oral Oral Oral  SpO2: 100% 100% 99% 100%  Weight:      Height:        Intake/Output Summary  (Last 24 hours) at 03/20/2020 1516 Last data filed at 03/20/2020 1347 Gross per 24 hour  Intake 2305.33 ml  Output 700 ml  Net 1605.33 ml   Filed Weights   03/12/20 0550  Weight: 58.5 kg    Examination:   General: Not in pain or dyspnea, deconditioned and ill looking appearing  Neurology: Awake and alert, non focal  E ENT: positive pallor, no icterus, oral mucosa moist Cardiovascular: No JVD. S1-S2 present, rhythmic, no gallops, rubs, or murmurs. No lower extremity edema. Pulmonary: vesicular breath sounds bilaterally, decreased inspiratory effort. Gastrointestinal. Abdomen soft and tender to  Superficial palpation, ostomy in place with bloody watery fluids in the bag. Skin. No rashes Musculoskeletal: no joint deformities     Data Reviewed: I have personally reviewed following labs and imaging studies  CBC: Recent Labs  Lab 03/15/20 0425 03/20/20 0803  WBC 7.2 6.4  NEUTROABS 6.0  --   HGB 9.4* 8.1*  HCT 29.6* 25.0*  MCV 101.0* 98.0  PLT 123* 518   Basic Metabolic Panel: Recent Labs  Lab 03/19/20 0456 03/20/20 0803  NA  --  134*  K  --  4.9  CL  --  104  CO2  --  21*  GLUCOSE  --  117*  BUN  --  16  CREATININE 0.61 0.71  CALCIUM  --  9.2   GFR: Estimated Creatinine Clearance: 51.6 mL/min (by C-G formula based on SCr of 0.71 mg/dL). Liver Function Tests: No results for input(s): AST, ALT, ALKPHOS, BILITOT, PROT, ALBUMIN in the last 168 hours. No results for input(s): LIPASE, AMYLASE in the last 168 hours. No results for input(s): AMMONIA in the last 168 hours. Coagulation Profile: No results for input(s): INR, PROTIME in the last 168 hours. Cardiac Enzymes: No results for input(s): CKTOTAL, CKMB, CKMBINDEX, TROPONINI in the last 168 hours. BNP (last 3 results) No results for input(s): PROBNP in the last 8760 hours. HbA1C: No results for input(s): HGBA1C in the last 72 hours. CBG: No results for input(s): GLUCAP in the last 168 hours. Lipid  Profile: No results for input(s): CHOL, HDL, LDLCALC, TRIG, CHOLHDL, LDLDIRECT in the last 72 hours. Thyroid Function Tests: No results for input(s): TSH, T4TOTAL, FREET4, T3FREE, THYROIDAB in the last 72 hours. Anemia Panel: No results for input(s): VITAMINB12, FOLATE, FERRITIN, TIBC, IRON, RETICCTPCT in the last 72 hours.    Radiology Studies: I have reviewed all of the imaging during this hospital visit personally     Scheduled Meds: . abacavir-dolutegravir-lamiVUDine  1 tablet Oral Daily  . Chlorhexidine Gluconate Cloth  6 each Topical Daily  . DULoxetine  20 mg Oral Daily  . enoxaparin (LOVENOX) injection  40 mg Subcutaneous Daily  . fentaNYL  1 patch Transdermal Q72H  . gabapentin  600 mg Oral BID  . lactose free nutrition  237 mL Oral TID WC  . liver oil-zinc oxide   Topical BID  . potassium chloride SA  20 mEq  Oral Daily  . senna-docusate  1 tablet Oral BID  . sucralfate  1 g Oral BID   Continuous Infusions:   LOS: 8 days        Taunia Frasco Gerome Apley, MD

## 2020-03-20 NOTE — Progress Notes (Signed)
Patient asked for her personal cell phone that she had with her on 6E before going to surgery. Patient informed me that she gave it to her nurse on 6E to charge for her and never got it back prior to going to surgery. Belongings bag is currently in her room, but can not locate cell phone. Called 6E to ask them to look for cell phone and could not locate phone. Will check back with floor.

## 2020-03-20 NOTE — Progress Notes (Signed)
Patient's urine output has only been 145mL in the past 12 hours.   Foley cath was removed at 6, after patient worked with PT.   RN has been encouraging PO intake all day.    RN alerted MD of low urine output at 1820.    Will await orders.    [place], Therapist, sports

## 2020-03-20 NOTE — Progress Notes (Addendum)
Pt DTV. Unable to urinate. Bladder scan volume of 54 ml. Fluid intake encouraged. Floor coverage MD paged.

## 2020-03-20 NOTE — Progress Notes (Signed)
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  patient opted for conservative management. It was recommended she go to a skilled nursing facility, but patient declined this and she was discharged 10/24. 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    OT comments  Pt reporting feeling better after ostomy sx yesterday. Neighbor present, Pt moves quickly and declines RW but furniture surfs and uses BUE support on sink for grooming tasks. Pt overall at min guard level - but declined attempt for LB ADL "I'll handle that later" which will need to be tackled prior to dc with new ostomy. OT will continue to follow with next session to focus on safety/safety awareness and LB ADL.   Follow Up Recommendations  SNF    Equipment Recommendations  Other (comment) (defer to next venue)    Recommendations for Other Services      Precautions / Restrictions Precautions Precautions: Fall Precaution Comments: new ostomy Restrictions Weight Bearing Restrictions: No       Mobility Bed Mobility Overal bed mobility: Needs Assistance Bed Mobility: Supine to Sit;Sit to Supine     Supine to sit: Min guard Sit to supine: Supervision   General bed mobility comments: increased time and effort - verbally encouraged log roll, but Pt perfoming supine>sit  Transfers Overall transfer level: Needs assistance   Transfers: Sit to/from Stand Sit to Stand: Supervision              Balance Overall balance assessment: Needs assistance Sitting-balance  support: No upper extremity supported Sitting balance-Leahy Scale: Fair     Standing balance support: Single extremity supported;Bilateral upper extremity supported;During functional activity Standing balance-Leahy Scale: Poor Standing balance comment: dependent on at least on UE support "furniture surfs" and BUE in standing for grooming tasks at sink                           ADL either performed or assessed with clinical judgement   ADL Overall ADL's : Needs assistance/impaired     Grooming: Wash/dry hands;Oral care;Standing Grooming Details (indicate cue type and reason): BUE resting on sink surface                 Toilet Transfer: Min guard;Ambulation;BSC Toilet Transfer Details (indicate cue type and reason): Pt refusing a RW and furniture cruising with very forward/stooped posture. Toileting- Water quality scientist and Hygiene: Min guard;Sit to/from stand       Functional mobility during ADLs: Min guard General ADL Comments: Refusing RW     Vision   Vision Assessment?: No apparent visual deficits   Perception     Praxis      Cognition Arousal/Alertness: Awake/alert Behavior During Therapy: WFL for tasks assessed/performed;Restless Overall Cognitive Status: Within Functional Limits for tasks assessed                                          Exercises     Shoulder Instructions  General Comments Pt's friend present throughout session    Pertinent Vitals/ Pain       Pain Assessment: 0-10 Faces Pain Scale: Hurts even more Pain Location: incision site Pain Descriptors / Indicators: Grimacing;Guarding;Sore Pain Intervention(s): Limited activity within patient's tolerance;Monitored during session;Repositioned  Home Living                                          Prior Functioning/Environment              Frequency  Min 2X/week        Progress Toward Goals  OT Goals(current goals can now  be found in the care plan section)  Progress towards OT goals: Progressing toward goals  Acute Rehab OT Goals Patient Stated Goal: go home OT Goal Formulation: With patient Time For Goal Achievement: 03/27/20 Potential to Achieve Goals: Good  Plan Discharge plan remains appropriate;Frequency remains appropriate (Pt likely to refuse SNF)    Co-evaluation                 AM-PAC OT "6 Clicks" Daily Activity     Outcome Measure   Help from another person eating meals?: None Help from another person taking care of personal grooming?: A Little Help from another person toileting, which includes using toliet, bedpan, or urinal?: A Little Help from another person bathing (including washing, rinsing, drying)?: A Little Help from another person to put on and taking off regular upper body clothing?: A Little Help from another person to put on and taking off regular lower body clothing?: A Lot 6 Click Score: 18    End of Session Equipment Utilized During Treatment: Gait belt  OT Visit Diagnosis: Muscle weakness (generalized) (M62.81);Pain;History of falling (Z91.81);Repeated falls (R29.6) Pain - part of body:  (rectum)   Activity Tolerance Patient tolerated treatment well   Patient Left in bed;with call bell/phone within reach;with bed alarm set;with family/visitor present   Nurse Communication Mobility status        Time: 2025-4270 OT Time Calculation (min): 19 min  Charges: OT General Charges $OT Visit: 1 Visit OT Treatments $Self Care/Home Management : 8-22 mins  Jesse Sans OTR/L Acute Rehabilitation Services Pager: [phone] Office: [place] 03/20/2020, 12:10 PM

## 2020-03-21 DIAGNOSIS — I10 Essential (primary) hypertension: Secondary | ICD-10-CM | POA: Diagnosis not present

## 2020-03-21 DIAGNOSIS — K6289 Other specified diseases of anus and rectum: Secondary | ICD-10-CM | POA: Diagnosis not present

## 2020-03-21 DIAGNOSIS — C210 Malignant neoplasm of anus, unspecified: Secondary | ICD-10-CM | POA: Diagnosis not present

## 2020-03-21 DIAGNOSIS — G893 Neoplasm related pain (acute) (chronic): Secondary | ICD-10-CM | POA: Diagnosis not present

## 2020-03-21 DIAGNOSIS — B182 Chronic viral hepatitis C: Secondary | ICD-10-CM | POA: Diagnosis not present

## 2020-03-21 DIAGNOSIS — Z515 Encounter for palliative care: Secondary | ICD-10-CM | POA: Diagnosis not present

## 2020-03-21 DIAGNOSIS — R531 Weakness: Secondary | ICD-10-CM | POA: Diagnosis not present

## 2020-03-21 LAB — CBC WITH DIFFERENTIAL/PLATELET
Abs Immature Granulocytes: 0.02 10*3/uL (ref 0.00–0.07)
Basophils Absolute: 0 10*3/uL (ref 0.0–0.1)
Basophils Relative: 0 %
Eosinophils Absolute: 0.1 10*3/uL (ref 0.0–0.5)
Eosinophils Relative: 1 %
HCT: 23.3 % — ABNORMAL LOW (ref 36.0–46.0)
Hemoglobin: 7.2 g/dL — ABNORMAL LOW (ref 12.0–15.0)
Immature Granulocytes: 0 %
Lymphocytes Relative: 11 %
Lymphs Abs: 0.6 10*3/uL — ABNORMAL LOW (ref 0.7–4.0)
MCH: 31.7 pg (ref 26.0–34.0)
MCHC: 30.9 g/dL (ref 30.0–36.0)
MCV: 102.6 fL — ABNORMAL HIGH (ref 80.0–100.0)
Monocytes Absolute: 0.4 10*3/uL (ref 0.1–1.0)
Monocytes Relative: 7 %
Neutro Abs: 4.6 10*3/uL (ref 1.7–7.7)
Neutrophils Relative %: 81 %
Platelets: 125 10*3/uL — ABNORMAL LOW (ref 150–400)
RBC: 2.27 MIL/uL — ABNORMAL LOW (ref 3.87–5.11)
RDW: 19.2 % — ABNORMAL HIGH (ref 11.5–15.5)
WBC: 5.7 10*3/uL (ref 4.0–10.5)
nRBC: 0 % (ref 0.0–0.2)

## 2020-03-21 LAB — BASIC METABOLIC PANEL
Anion gap: 5 (ref 5–15)
BUN: 16 mg/dL (ref 8–23)
CO2: 26 mmol/L (ref 22–32)
Calcium: 8.9 mg/dL (ref 8.9–10.3)
Chloride: 105 mmol/L (ref 98–111)
Creatinine, Ser: 0.65 mg/dL (ref 0.44–1.00)
GFR, Estimated: >60 mL/min (ref >60)
Glucose, Bld: 106 mg/dL — ABNORMAL HIGH (ref 70–99)
Potassium: 4.6 mmol/L (ref 3.5–5.1)
Sodium: 136 mmol/L (ref 135–145)

## 2020-03-21 MED ORDER — ENSURE ENLIVE PO LIQD
237.0000 mL | Freq: Two times a day (BID) | ORAL | Status: DC
  Administered 2020-03-21 – 2020-03-26 (×10): 237 mL via ORAL

## 2020-03-21 MED ORDER — FENTANYL 75 MCG/HR TD PT72
1.0000 | MEDICATED_PATCH | TRANSDERMAL | Status: DC
  Administered 2020-03-21 – 2020-03-27 (×3): 1 via TRANSDERMAL
  Filled 2020-03-21 (×3): qty 1

## 2020-03-21 NOTE — Progress Notes (Addendum)
2 Days Post-Op  Subjective: CC: Doing well. Not having any abdominal pain. Noted some air in bag this morning. Has not had much off FLD menu - only juices, boost and sodas. No n/v. Working with PT. Foley out yesterday. Voided x 1 this am.   Objective: Vital signs in last 24 hours: Temp:  [97.8 F (36.6 C)-99.8 F (37.7 C)] 97.8 F (36.6 C) (11/02 2148) Pulse Rate:  [82-104] 98 (11/03 0501) Resp:  [16-18] 16 (11/03 0501) BP: (96-113)/(63-81) 110/74 (11/03 0501) SpO2:  [93 %-100 %] 93 % (11/03 0501) Last BM Date: 03/18/20  Intake/Output from previous day: 11/02 0701 - 11/03 0700 In: 860 [P.O.:360; IV Piggyback:500] Out: 150 [Urine:100; Stool:50] Intake/Output this shift: No intake/output data recorded.  PE: Gen:  Alert, NAD, pleasant Pulm: Normal rate and effort  Abd: Soft, ND, +BS, colostomy in place with some air and sweat in bag. Stoma dark red and slightly budded. NT  Lab Results:  Recent Labs    03/20/20 0803 03/21/20 0444  WBC 6.4 5.7  HGB 8.1* 7.2*  HCT 25.0* 23.3*  PLT 150 125*   BMET Recent Labs    03/20/20 0803 03/21/20 0444  NA 134* 136  K 4.9 4.6  CL 104 105  CO2 21* 26  GLUCOSE 117* 106*  BUN 16 16  CREATININE 0.71 0.65  CALCIUM 9.2 8.9   PT/INR No results for input(s): LABPROT, INR in the last 72 hours. CMP     Component Value Date/Time   NA 136 03/21/2020 0444   NA 142 08/31/2013 1515   K 4.6 03/21/2020 0444   K 3.4 (L) 08/31/2013 1515   CL 105 03/21/2020 0444   CL 104 07/07/2012 1512   CO2 26 03/21/2020 0444   CO2 27 08/31/2013 1515   GLUCOSE 106 (H) 03/21/2020 0444   GLUCOSE 69 (L) 08/31/2013 1515   GLUCOSE 88 07/07/2012 1512   BUN 16 03/21/2020 0444   BUN 14.7 08/31/2013 1515   CREATININE 0.65 03/21/2020 0444   CREATININE 0.82 02/17/2020 0824   CREATININE 0.87 08/08/2019 1535   CREATININE 0.8 08/31/2013 1515   CALCIUM 8.9 03/21/2020 0444   CALCIUM 9.6 08/31/2013 1515   PROT 7.5 03/03/2020 1545   PROT 8.8 (H)  08/31/2013 1515   ALBUMIN 2.6 (L) 03/03/2020 1545   ALBUMIN 3.7 08/31/2013 1515   AST 23 03/03/2020 1545   AST 22 02/17/2020 0824   AST 25 08/31/2013 1515   ALT 12 03/03/2020 1545   ALT 8 02/17/2020 0824   ALT 9 08/31/2013 1515   ALKPHOS 73 03/03/2020 1545   ALKPHOS 99 08/31/2013 1515   BILITOT 0.7 03/03/2020 1545   BILITOT 0.7 02/17/2020 0824   BILITOT 0.67 08/31/2013 1515   GFRNONAA >60 03/21/2020 0444   GFRNONAA >60 02/17/2020 0824   GFRNONAA 67 08/08/2019 1535   GFRAA >60 02/17/2020 0824   GFRAA 78 08/08/2019 1535   Lipase     Component Value Date/Time   LIPASE 25 11/02/2019 0030       Studies/Results: No results found.  Anti-infectives: Anti-infectives (From admission, onward)   Start     Dose/Rate Route Frequency Ordered Stop   03/19/20 0715  cefoTEtan (CEFOTAN) 2 g in sodium chloride 0.9 % 100 mL IVPB        2 g 200 mL/hr over 30 Minutes Intravenous On call to O.R. 03/19/20 0277 03/19/20 1538   03/12/20 1000  abacavir-dolutegravir-lamiVUDine (TRIUMEQ) 600-50-300 MG per tablet 1 tablet  1 tablet Oral Daily 03/12/20 0914         Assessment/Plan HIV HTN Chronic anemia secondary to GI losses Hx of Hep C ABL Anemia - hgb 7.2  Anal squamous cell carcinoma with necrotic anorectal mass S/p Laparoscopic ostomy placement - Dr. Marcello Moores - 03/19/2020 - POD #2 - WOCN consult  - Mobilize - PT recommending SNF - FLD - AROBF - Pulm toilet   FEN - FLD. Add Ensure  VTE - SCDs, Lovenox  ID - cefotetan periop Foley - d/c'd 11/2. Monitor output    LOS: 9 days    Jillyn Ledger , [facility] 03/21/2020, 8:54 AM Please see Amion for pager number during day hours 7:00am-4:30pm

## 2020-03-21 NOTE — Consult Note (Signed)
[place] Nurse ostomy follow up Stoma type/location: RLQ, end colostomy Stomal assessment/size: 1 3/8" round, budded, pink, moist with os at skin level at 6 o'clock  Peristomal assessment: intact, dips in the abdominal topography at 3 and 9 o'clock, flaccid skin that overlaps onto the proximal edge of the stoma    Treatment options for stomal/peristomal skin: 2" barrier ring Output scant bloody Ostomy pouching: 1pc convex with 2" barrier ring.  Will need convexity, may need flex convex with next pouch change. I will make the others on the team aware.   Education provided:  Explained role of ostomy nurse and creation of stoma  Explained stoma characteristics (budded, flush, color, texture, care) Demonstrated pouch change (cutting new skin barrier, measuring stoma, cleaning peristomal skin and stoma, use of barrier ring) Education on emptying when 1/3 to 1/2 full and how to empty Demonstrated use of wick to clean spout  Discussed bathing, diet, gas, medication use, constipation Patient does observe pouch change but when asked to participate she declines. She lives alone.  I have explained that she must learn to empty and close lock and roll closure prior to DC.  She did practice closing new pouch once in place with success.   Enrolled patient in [place] Start Discharge program: Yes  [place] Nurse will follow along with you for continued support with ostomy teaching and care [place] MSN, [place], [place], [place], [place]

## 2020-03-21 NOTE — NC FL2 (Signed)
[facility] LEVEL OF CARE SCREENING TOOL     IDENTIFICATION  Patient Name: Vanessa Beard Birthdate: [birth year] Sex: female Admission Date (Current Location): 03/12/2020  [facility] and [place] Number:  Herbalist and Address:  [facility],  [address] [place], [place]      Provider Number: [id number]  Attending Physician Name and Address:  Tawni Millers  Relative Name and Phone Number:       Current Level of Care: Hospital Recommended Level of Care: [place] Prior Approval Number:    Date Approved/Denied:   PASRR Number: [id number]  Discharge Plan: SNF    Current Diagnoses: Patient Active Problem List   Diagnosis Date Noted  . Palliative care by specialist   . General weakness   . Protein-calorie malnutrition, severe 03/05/2020  . Palliative care encounter 01/27/2020  . Goals of care, counseling/discussion 01/16/2020  . Hemorrhoid 12/16/2019  . Rectal pain 12/16/2019  . Anal cancer ([place]) 07/26/2019  . Rectal mass 07/14/2019  . Constipation 07/14/2019  . Anal fissure 07/14/2019  . Healthcare maintenance 06/30/2018  . Left knee pain 12/30/2017  . Left shoulder pain 12/30/2017  . Closed fracture of right tibial plateau 02/18/2017  . Benign hypertension 02/15/2017  . Screening examination for venereal disease 03/06/2015  . Encounter for long-term (current) use of medications 03/06/2015  . Elevated LFTs 12/11/2014  . Abnormal ultrasound 12/11/2014  . Abdominal pain, epigastric 12/11/2014  . ASCUS of cervix with negative high risk HPV 10/31/2013  . Gall bladder disease 02/03/2013  . 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  . Compression fracture of L3 lumbar vertebra 07/01/2011  . Thyroid dysfunction 07/01/2011  . Anemia 07/01/2011  . Sore throat 04/02/2011  . Hyperparathyroidism 02/10/2011  . Osteopenia 02/03/2011  . PAP SMEAR, LGSIL, ABNORMAL 12/21/2009  . COUGH  07/18/2009  . Chronic hepatitis C virus infection ([place]) 12/19/2008  . LEUKORRHEA 04/07/2008  . FATIGUE 04/07/2008  . BACK PAIN, LUMBAR 11/10/2007  . ALLERGIC RHINITIS 07/09/2007  . PNEUMOCYSTIS PNEUMONIA 05/07/2007  . Essential hypertension 05/07/2007  . DENTAL CARIES 05/07/2007  . INSOMNIA 05/07/2007  . PNEUMONIA, HX OF 05/07/2007  . HERPES ZOSTER, HX OF 05/07/2007  . Human immunodeficiency virus (HIV) disease ([place]) 08/04/2006    Orientation RESPIRATION BLADDER Height & Weight     Self, Time, Situation, Place  Normal Incontinent Weight: 129 lb (58.5 kg) Height:  5' (152.4 cm)  BEHAVIORAL SYMPTOMS/MOOD NEUROLOGICAL BOWEL NUTRITION STATUS      Colostomy Diet  AMBULATORY STATUS COMMUNICATION OF NEEDS Skin   Limited Assist Verbally Surgical wounds, Other (Comment) (new colostomy)                       Personal Care Assistance Level of Assistance  Bathing, Dressing Bathing Assistance: Limited assistance   Dressing Assistance: Limited assistance     Functional Limitations Info             SPECIAL CARE FACTORS FREQUENCY  PT (By licensed PT), OT (By licensed OT)     PT Frequency: 5 x weekly OT Frequency: 5 x weekly            Contractures Contractures Info: Not present    Additional Factors Info  Code Status, Allergies Code Status Info: DNR Allergies Info: Dapsone, Retrovir, Sulfamethoxazole-trimethoprim           Current Medications (03/21/2020):  This is the current hospital active medication list Current Facility-Administered Medications  Medication Dose  Route Frequency Provider Last Rate Last Admin  . abacavir-dolutegravir-lamiVUDine (TRIUMEQ) 921-19-417 MG per tablet 1 tablet  1 tablet Oral Daily Jillyn Ledger, PA-C   1 tablet at 03/21/20 4081  . acetaminophen (TYLENOL) tablet 1,000 mg  1,000 mg Oral Q8H PRN Maczis, Barth Kirks, PA-C      . belladonna-opium (B&O) suppository 16.2-30 mg  1 suppository Rectal Q6H PRN Jillyn Ledger, PA-C   1  suppository at 03/18/20 1508  . butamben-tetracaine-benzocaine (CETACAINE) spray 1 spray  1 spray Topical TID PRN Jillyn Ledger, PA-C   1 spray at 03/18/20 1508  . Chlorhexidine Gluconate Cloth 2 % PADS 6 each  6 each Topical Daily Dana Allan I, MD   6 each at 03/20/20 0900  . DULoxetine (CYMBALTA) DR capsule 20 mg  20 mg Oral Daily Jillyn Ledger, PA-C   20 mg at 03/21/20 0935  . enoxaparin (LOVENOX) injection 40 mg  40 mg Subcutaneous Daily Jillyn Ledger, PA-C   40 mg at 03/21/20 0935  . feeding supplement (ENSURE ENLIVE / ENSURE PLUS) liquid 237 mL  237 mL Oral BID BM Jillyn Ledger, PA-C   237 mL at 03/21/20 1253  . fentaNYL (DURAGESIC) 50 MCG/HR 1 patch  1 patch Transdermal Q72H Jillyn Ledger, PA-C   1 patch at 03/19/20 1109  . gabapentin (NEURONTIN) capsule 600 mg  600 mg Oral BID Jillyn Ledger, PA-C   600 mg at 03/21/20 4481  . HYDROmorphone (DILAUDID) injection 1 mg  1 mg Intravenous Q2H PRN Jillyn Ledger, PA-C   1 mg at 03/21/20 1017  . HYDROmorphone (DILAUDID) tablet 4 mg  4 mg Oral Q3H PRN Micheline Rough, MD   4 mg at 03/21/20 0714  . lactose free nutrition (BOOST PLUS) liquid 237 mL  237 mL Oral TID WC Jillyn Ledger, PA-C   237 mL at 03/21/20 0734  . liver oil-zinc oxide (DESITIN) 40 % ointment   Topical BID Jillyn Ledger, PA-C   Given at 03/21/20 8563  . polyethylene glycol (MIRALAX / GLYCOLAX) packet 17 g  17 g Oral Daily PRN Maczis, Barth Kirks, PA-C      . senna-docusate (Senokot-S) tablet 1 tablet  1 tablet Oral BID Jillyn Ledger, PA-C   1 tablet at 03/21/20 1497  . sucralfate (CARAFATE) tablet 1 g  1 g Oral BID Jillyn Ledger, PA-C   1 g at 03/21/20 0935  . zolpidem (AMBIEN) tablet 5 mg  5 mg Oral QHS PRN Jillyn Ledger, PA-C         Discharge Medications: Please see discharge summary for a list of discharge medications.  Relevant Imaging Results:  Relevant Lab Results:   Additional Information ss# [id number]  large  visible anal mass that is ulcerated and friable  Candon Caras, LCSW

## 2020-03-21 NOTE — Consult Note (Signed)
   [facility] Inpatient Consult   03/21/2020  PETER DAQUILA 04-25-1951 [id number]   Patient chart has been reviewed for readmissions less than 30 days and for high risk score for unplanned readmissions.  Patient assessed for community [place] Management follow up needs.  Chart review reveals current disposition plan is for SNF or home hospice at this time. Not [facility] CM identifiable needs at this time.   Netta Cedars, MSN, [facility] Liaison Nurse Mobile Phone [phone]  Toll free office [phone]

## 2020-03-21 NOTE — Plan of Care (Signed)
  Problem: Clinical Measurements: Goal: Respiratory complications will improve Outcome: Progressing   Problem: Clinical Measurements: Goal: Cardiovascular complication will be avoided Outcome: Progressing   Problem: Elimination: Goal: Will not experience complications related to bowel motility Outcome: Progressing   Problem: Skin Integrity: Goal: Risk for impaired skin integrity will decrease Outcome: Progressing   Problem: Safety: Goal: Ability to remain free from injury will improve Outcome: Progressing   Problem: Pain Managment: Goal: General experience of comfort will improve Outcome: Progressing   

## 2020-03-21 NOTE — TOC Progression Note (Signed)
Transition of Care [facility]) - Progression Note    Patient Details  Name: Vanessa Beard MRN: [id number] Date of Birth: 12-15-1950  [facility] of [facility] CM/SW Contact  Lennart Pall, LCSW Phone Number: 03/21/2020, 1:39 PM  Clinical Narrative:    Alerted by MD that he feels pt continues to need SNF placement.  Met with pt and she is in agreement with this plan.  Have restarted SNF bed search now that colostomy has been placed and have reached out to facility who had offered prior.  Will need insurance authorization as well.  Continue to follow.   Expected Discharge Plan: [place] Barriers to Discharge: Continued Medical Work up  Expected Discharge Plan and Services Expected Discharge Plan: [place]   Discharge Planning Services: CM Consult Post Acute Care Choice: [place] Living arrangements for the past 2 months: Apartment                                       Social Determinants of Health (SDOH) Interventions    Readmission Risk Interventions Readmission Risk Prevention Plan 03/13/2020 03/09/2020  Transportation Screening Complete Complete  PCP or Specialist Appt within 3-5 Days Complete Complete  HRI or [place] Complete Complete  Social Work Consult for [place] Planning/Counseling Complete Complete  Palliative Care Screening Complete Complete  Medication Review Press photographer) - Complete  Some recent data might be hidden

## 2020-03-21 NOTE — Progress Notes (Addendum)
PROGRESS NOTE    Vanessa Beard  WNI:[id number] DOB: Jan 18, 1951 DOA: 03/12/2020 PCP: Vanessa Carol, MD    Brief Narrative:  Vanessa Beard was admitted to the hospital with a working diagnosis of worsening anorectal squamous cell carcinoma. Now sp diverting colostomy   69 year old female with past medical history for hypertension, HIV, severe protein calorie malnutrition and anorectal squamous cell carcinoma.  Patient was readmitted 24 hours after discharge from the hospital.  On that prior hospitalization patient was diagnosed with necrotic anal rectal mass, she declined palliative diverting colostomy.  She underwent IR guided nerve block and was discharged with oral analgesics.  At home her pain became intractable that prompted her to come back to the hospital. On her initial physical examination blood pressure 121/79, heart rate 62, temperature 98.2, respiratory rate 11, oxygen saturation 96%, her lungs are clear to auscultation bilaterally, abdomen was soft nontender, no lower extremity edema.  Patient has been receiving analgesics. Surgery was consulted and she underwent palliative diverting colostomy 03/19/2020.  Palliative care services have been consulted, patient will be discharged either home with home hospice or to rehab to attempt to regain functional status.   Assessment & Plan:   Principal Problem:   Anal cancer ([place]) Active Problems:   Human immunodeficiency virus (HIV) disease ([place])   Chronic hepatitis C virus infection ([place])   Essential hypertension   Protein-calorie malnutrition, severe   Palliative care by specialist   General weakness   1. Anorectal squamous cell carcinoma/ mass/ anemia due to malignancy/ postoperative. SP laparoscopic ostomy placement on 11/01.  Hgb this am is down to 7,2 with Hct at 23.3, post operative anemia.   Continue pain control with fentanyl patch 50 mcg, and as needed hydromorphone.  On gabapentin 600 mg bid. Bowel regimen with  miralax and senna.   Check H&H in am.   Diet advanced per surgery team. Patient continue to be very weak and deconditioned, will need SNF at discharge.    2. HIV. Tolerating well antiretroviral therapy.   3. Depression. On duloxetine.   4. Severe calorie protein malnutrition. Continue with nutritional supplements.  5. Hypokalemia. Stable K at 4,6 with renal function with cr at 0,65 and bicarbonate at 26.   6. GERD. Continue with sucralfate.   Status is: Inpatient  Remains inpatient appropriate because:Inpatient level of care appropriate due to severity of illness   Dispo: The patient is from: Home              Anticipated d/c is to: SNF              Anticipated d/c date is: 2 days              Patient currently is not medically stable to d/c.   DVT prophylaxis: Enoxaparin   Code Status:   dnr   Family Communication:  No family at the bedside      Nutrition Status: Nutrition Problem: Severe Malnutrition Etiology: chronic illness, cancer and cancer related treatments Signs/Symptoms: percent weight loss, severe fat depletion, severe muscle depletion Percent weight loss: 16 % (x 2.5 months) Interventions: Boost Plus, MVI, Magic cup     Consultants:   Surgery    Palliative Care   Procedures:   Ostomy placement on 11.01      Subjective: Patient continue to be very weak and deconditioned, no nausea or vomiting, abdominal pain is better controlled but not yet back to baseline,   Objective: Vitals:   03/20/20 0907 03/20/20 1344 03/20/20 2148  03/21/20 0501  BP: 108/76 96/63 113/81 110/74  Pulse: 89 82 (!) 104 98  Resp: 16 18 18 16   Temp: 99.2 F (37.3 C) 99.8 F (37.7 C) 97.8 F (36.6 C)   TempSrc: Oral Oral    SpO2: 99% 100% 97% 93%  Weight:      Height:        Intake/Output Summary (Last 24 hours) at 03/21/2020 1256 Last data filed at 03/21/2020 1000 Gross per 24 hour  Intake 860 ml  Output 160 ml  Net 700 ml   Filed Weights   03/12/20 0550   Weight: 58.5 kg    Examination:   General: deconditioned  Neurology: Awake and alert, non focal  E ENT: positive pallor, no icterus, oral mucosa moist Cardiovascular: No JVD. S1-S2 present, rhythmic, no gallops, rubs, or murmurs. No lower extremity edema. Pulmonary: positive breath sounds bilaterally, with no wheezing, rhonchi or rales. Gastrointestinal. Abdomen mild distended, ostomy bag in place with a small amount of clear liquid, no bloody content Skin. No rashes Musculoskeletal: no joint deformities     Data Reviewed: I have personally reviewed following labs and imaging studies  CBC: Recent Labs  Lab 03/15/20 0425 03/20/20 0803 03/21/20 0444  WBC 7.2 6.4 5.7  NEUTROABS 6.0  --  4.6  HGB 9.4* 8.1* 7.2*  HCT 29.6* 25.0* 23.3*  MCV 101.0* 98.0 102.6*  PLT 123* 150 016*   Basic Metabolic Panel: Recent Labs  Lab 03/19/20 0456 03/20/20 0803 03/21/20 0444  NA  --  134* 136  K  --  4.9 4.6  CL  --  104 105  CO2  --  21* 26  GLUCOSE  --  117* 106*  BUN  --  16 16  CREATININE 0.61 0.71 0.65  CALCIUM  --  9.2 8.9   GFR: Estimated Creatinine Clearance: 51.6 mL/min (by C-G formula based on SCr of 0.65 mg/dL). Liver Function Tests: No results for input(s): AST, ALT, ALKPHOS, BILITOT, PROT, ALBUMIN in the last 168 hours. No results for input(s): LIPASE, AMYLASE in the last 168 hours. No results for input(s): AMMONIA in the last 168 hours. Coagulation Profile: No results for input(s): INR, PROTIME in the last 168 hours. Cardiac Enzymes: No results for input(s): CKTOTAL, CKMB, CKMBINDEX, TROPONINI in the last 168 hours. BNP (last 3 results) No results for input(s): PROBNP in the last 8760 hours. HbA1C: No results for input(s): HGBA1C in the last 72 hours. CBG: No results for input(s): GLUCAP in the last 168 hours. Lipid Profile: No results for input(s): CHOL, HDL, LDLCALC, TRIG, CHOLHDL, LDLDIRECT in the last 72 hours. Thyroid Function Tests: No results for  input(s): TSH, T4TOTAL, FREET4, T3FREE, THYROIDAB in the last 72 hours. Anemia Panel: No results for input(s): VITAMINB12, FOLATE, FERRITIN, TIBC, IRON, RETICCTPCT in the last 72 hours.    Radiology Studies: I have reviewed all of the imaging during this hospital visit personally     Scheduled Meds: . abacavir-dolutegravir-lamiVUDine  1 tablet Oral Daily  . Chlorhexidine Gluconate Cloth  6 each Topical Daily  . DULoxetine  20 mg Oral Daily  . enoxaparin (LOVENOX) injection  40 mg Subcutaneous Daily  . feeding supplement  237 mL Oral BID BM  . fentaNYL  1 patch Transdermal Q72H  . gabapentin  600 mg Oral BID  . lactose free nutrition  237 mL Oral TID WC  . liver oil-zinc oxide   Topical BID  . senna-docusate  1 tablet Oral BID  . sucralfate  1 g  Oral BID   Continuous Infusions:   LOS: 9 days        Madicyn Mesina Gerome Apley, MD

## 2020-03-21 NOTE — Progress Notes (Signed)
Daily Progress Note   Patient Name: Vanessa Beard       Date: 03/21/2020 DOB: Apr 24, 1951  Age: 69 y.o. MRN#: [id number] Attending Physician: Tawni Millers Primary Care Physician: Seward Carol, MD Admit Date: 03/12/2020  Reason for Consultation/Follow-up: Establishing goals of care  Subjective: Ms. Windsor was awake, alert, and reports having a good day but also that she is more uncomfortable today than she was yesterday.    Reviewed MAR and she has had 3 doses of 1mg  of IV dilaudid as well as 3 doses of 4mg  of oral dilaudid for a total oral morphine equivalent of 108mg  of oral morphine.  We discussed that this is not likely to go away and she is in agreement with recommendation to increase her fentanyl patch.  We also discussed options for discharge and she now feels that she would be best served by transition to SNF for rehab at time of discharge.  She would like to work to improve her overall functional status as much as possible, but overall goal remains to be at home with family.    I called per her request to discuss with her friend, [name].  Reviewed plan for pain management and Ms Roa desire to go to rehab at time of discharge.  [name] expressed understanding and support for whatever patient desires at time of discharge.  She will relay message to her daughter (who is currently out of the country).     Length of Stay: 9  Current Medications: Scheduled Meds:  . abacavir-dolutegravir-lamiVUDine  1 tablet Oral Daily  . Chlorhexidine Gluconate Cloth  6 each Topical Daily  . DULoxetine  20 mg Oral Daily  . enoxaparin (LOVENOX) injection  40 mg Subcutaneous Daily  . feeding supplement  237 mL Oral BID BM  . fentaNYL  1 patch Transdermal Q72H  . gabapentin  600 mg Oral BID   . lactose free nutrition  237 mL Oral TID WC  . liver oil-zinc oxide   Topical BID  . senna-docusate  1 tablet Oral BID  . sucralfate  1 g Oral BID    Continuous Infusions:   PRN Meds: acetaminophen, belladonna-opium, butamben-tetracaine-benzocaine, HYDROmorphone (DILAUDID) injection, HYDROmorphone, polyethylene glycol, zolpidem  Physical Exam         No distress.  Awake and alert and interactive.  Pleasant  and cooperative but appears to be more uncomfortable today. Regular work of breathing Abdomen not distended, deferred ostomy exam Non focal No edema S1 S 2   Vital Signs: BP 95/65 (BP Location: Right Arm)   Pulse 81   Temp 98.5 F (36.9 C) (Oral)   Resp 16   Ht 5' (1.524 m)   Wt 58.5 kg   SpO2 99%   BMI 25.19 kg/m  SpO2: SpO2: 99 % O2 Device: O2 Device: Room Air O2 Flow Rate: O2 Flow Rate (L/min): 2 L/min  Intake/output summary:   Intake/Output Summary (Last 24 hours) at 03/21/2020 1533 Last data filed at 03/21/2020 1400 Gross per 24 hour  Intake 1340 ml  Output 60 ml  Net 1280 ml   LBM: Last BM Date: 03/18/20 Baseline Weight: Weight: 58.5 kg Most recent weight: Weight: 58.5 kg      PPS 50% Palliative Assessment/Data:    Flowsheet Rows     Most Recent Value  Intake Tab  Referral Department Hospitalist  Unit at Time of Referral Oncology Unit  Palliative Care Primary Diagnosis Cancer  Date Notified 03/13/20  Palliative Care Type Return patient Palliative Care  Reason for referral Pain  Date of Admission 03/12/20  Date first seen by Palliative Care 03/13/20  # of days Palliative referral response time 0 Day(s)  # of days IP prior to Palliative referral 1  Clinical Assessment  Psychosocial & Spiritual Assessment  Palliative Care Outcomes      Patient Active Problem List   Diagnosis Date Noted  . Palliative care by specialist   . General weakness   . Protein-calorie malnutrition, severe 03/05/2020  . Palliative care encounter 01/27/2020  .  Goals of care, counseling/discussion 01/16/2020  . Hemorrhoid 12/16/2019  . Rectal pain 12/16/2019  . Anal cancer ([place]) 07/26/2019  . Rectal mass 07/14/2019  . Constipation 07/14/2019  . Anal fissure 07/14/2019  . Healthcare maintenance 06/30/2018  . Left knee pain 12/30/2017  . Left shoulder pain 12/30/2017  . Closed fracture of right tibial plateau 02/18/2017  . Benign hypertension 02/15/2017  . Screening examination for venereal disease 03/06/2015  . Encounter for long-term (current) use of medications 03/06/2015  . Elevated LFTs 12/11/2014  . Abnormal ultrasound 12/11/2014  . Abdominal pain, epigastric 12/11/2014  . ASCUS of cervix with negative high risk HPV 10/31/2013  . Gall bladder disease 02/03/2013  . 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  . Compression fracture of L3 lumbar vertebra 07/01/2011  . Thyroid dysfunction 07/01/2011  . Anemia 07/01/2011  . Sore throat 04/02/2011  . Hyperparathyroidism 02/10/2011  . Osteopenia 02/03/2011  . PAP SMEAR, LGSIL, ABNORMAL 12/21/2009  . COUGH 07/18/2009  . Chronic hepatitis C virus infection ([place]) 12/19/2008  . LEUKORRHEA 04/07/2008  . FATIGUE 04/07/2008  . BACK PAIN, LUMBAR 11/10/2007  . ALLERGIC RHINITIS 07/09/2007  . PNEUMOCYSTIS PNEUMONIA 05/07/2007  . Essential hypertension 05/07/2007  . DENTAL CARIES 05/07/2007  . INSOMNIA 05/07/2007  . PNEUMONIA, HX OF 05/07/2007  . HERPES ZOSTER, HX OF 05/07/2007  . Human immunodeficiency virus (HIV) disease ([place]) 08/04/2006    Palliative Care Assessment & Plan   Patient Profile:    Assessment:  squamous cell ca anus Severe pain Necrotic anorectal mass Has history of HIV HTN  Recommendations/Plan:  Status post diverting ostomy .  Patient now reports that she feels she would be best served to pursue placement at [facility] for short term rehab.  TOC aware and appreciate assistance.    Pain management: Currently on fentanyl  28mcg/hour.  She has also had  a total oral morphine equivalent of 108mg  of oral morphine in short acting rescue medications.  Will plan to increase fentanyl patch to 75 mcg/hr.  Recommend continue to utilize dilaudid 1mg  IV every 2 hours tonight and will try to transition to oral rescue medication over the next day or so once fentanyl increase hits steady state.     Code Status:    Code Status Orders  (From admission, onward)         Start     Ordered   03/12/20 0940  Do not attempt resuscitation (DNR)  Continuous       Question Answer Comment  In the event of cardiac or respiratory ARREST Do not call a "code blue"   In the event of cardiac or respiratory ARREST Do not perform Intubation, CPR, defibrillation or ACLS   In the event of cardiac or respiratory ARREST Use medication by any route, position, wound care, and other measures to relive pain and suffering. May use oxygen, suction and manual treatment of airway obstruction as needed for comfort.      03/12/20 0939        Code Status History    Date Active Date Inactive Code Status Order ID Comments User Context   03/03/2020 1955 03/11/2020 2230 DNR [id number]  Lenore Cordia, MD ED   02/15/2017 2327 02/21/2017 1728 Full Code [id number]  Jani Gravel, MD Inpatient   Advance Care Planning Activity       Prognosis:   Unable to determine  Discharge Planning:  [place] for rehab with Palliative care service follow-up  Care plan was discussed with  Patient, friend carol via phone     Thank you for allowing the Palliative Medicine Team to assist in the care of this patient.   Time In: 1500 Time Out: 1535 Total Time 35 Prolonged Time Billed  no    Greater than 50%  of this time was spent counseling and coordinating care related to the above assessment and plan.  Micheline Rough, MD  Please contact Palliative Medicine Team phone at [phone] for questions and concerns.

## 2020-03-22 DIAGNOSIS — C210 Malignant neoplasm of anus, unspecified: Secondary | ICD-10-CM | POA: Diagnosis not present

## 2020-03-22 DIAGNOSIS — R531 Weakness: Secondary | ICD-10-CM | POA: Diagnosis not present

## 2020-03-22 DIAGNOSIS — I10 Essential (primary) hypertension: Secondary | ICD-10-CM | POA: Diagnosis not present

## 2020-03-22 DIAGNOSIS — B182 Chronic viral hepatitis C: Secondary | ICD-10-CM | POA: Diagnosis not present

## 2020-03-22 LAB — HEMOGLOBIN AND HEMATOCRIT, BLOOD
HCT: 23 % — ABNORMAL LOW (ref 36.0–46.0)
Hemoglobin: 7.2 g/dL — ABNORMAL LOW (ref 12.0–15.0)

## 2020-03-22 MED ORDER — SIMETHICONE 80 MG PO CHEW
80.0000 mg | CHEWABLE_TABLET | Freq: Four times a day (QID) | ORAL | Status: DC | PRN
  Administered 2020-03-22 – 2020-03-26 (×4): 80 mg via ORAL
  Filled 2020-03-22 (×4): qty 1

## 2020-03-22 NOTE — Consult Note (Signed)
WOC Nurse Consult Note: Discussed with Bedside RN importance of patient continuing to practice opening/closing and emptying ostomy pouch. Requested that she pass along to NTs and oncoming RN and NT this evening the same message.  Planned pouch change is tomorrow morning, but patient must be independent in emptying prior to discharge. All are requested to document progress toward independence in the EMR.  [place] nursing team will follow, and will remain available to this patient, the nursing, surgical and medical teams.   Thanks, Maudie Flakes, MSN, RN, [place], Arther Abbott  Pager# [phone]

## 2020-03-22 NOTE — Progress Notes (Signed)
Physical Therapy Treatment Patient Details Name: Vanessa Beard MRN: [id number] DOB: Jan 07, 1951 Today's Date: 03/22/2020    History of Present Illness 69 year old female with PMH including HTN, HIV,  malnutrition and anal squamous cell carcinoma on chemotherapy who previously was admitted on 10/16 for rectal bleeding and pain and found to have a necrotic anal rectal mass.  patient opted for conservative management. It was recommended she go to a skilled nursing facility, but patient declined this and she was discharged 10/24. 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     PT Comments    Pt ambulated short distance in hallway and fatigued quickly.  Pt with rectal bleeding upon return to room.  Pt able to wash off lower extremities with provided wash clothes however required rest breaks and then assisted back to bed.  Continue to recommend SNF upon d/c.   Follow Up Recommendations  SNF     Equipment Recommendations  None recommended by PT    Recommendations for Other Services       Precautions / Restrictions Precautions Precautions: Fall Precaution Comments: new ostomy    Mobility  Bed Mobility Overal bed mobility: Needs Assistance Bed Mobility: Sit to Sidelying   Sidelying to sit: Supervision;HOB elevated     Sit to sidelying: Supervision General bed mobility comments: patient requiring increased time and effort to perform bed mobility, provided cues for log roll pt reply "let me do it on my own time"  Transfers Overall transfer level: Needs assistance Equipment used: None Transfers: Sit to/from Stand Sit to Stand: Min guard         General transfer comment: min/guard for safety, uses UEs to self assist, tends to move with flexion posture  Ambulation/Gait Ambulation/Gait assistance: Min guard Gait Distance (Feet): 60 Feet  (x2) Assistive device: Rolling walker (2 wheeled) Gait Pattern/deviations: Step-through pattern;Decreased stride length;Trunk flexed Gait velocity: decr   General Gait Details: utilized RW for support, slow and steady pace with RW, standing rest break; pt with rectal bleeding upon return to room   Stairs             Wheelchair Mobility    Modified Rankin (Stroke Patients Only)       Balance Overall balance assessment: Needs assistance Sitting-balance support: No upper extremity supported Sitting balance-Leahy Scale: Fair     Standing balance support: Single extremity supported Standing balance-Leahy Scale: Poor Standing balance comment: dependent on at least  UE support                            Cognition Arousal/Alertness: Awake/alert Behavior During Therapy: WFL for tasks assessed/performed Overall Cognitive Status: Within Functional Limits for tasks assessed                                        Exercises      General Comments        Pertinent Vitals/Pain Pain Assessment: Faces Faces Pain Scale: Hurts little more Pain Location: colostomy site Pain Descriptors / Indicators: Grimacing;Guarding;Sore Pain Intervention(s): Monitored during session;Repositioned    Home Living                      Prior Function  PT Goals (current goals can now be found in the care plan section) Acute Rehab PT Goals Patient Stated Goal: brush teeth PT Goal Formulation: With patient Time For Goal Achievement: 03/29/20 Potential to Achieve Goals: Fair Progress towards PT goals: Progressing toward goals    Frequency    Min 2X/week      PT Plan Current plan remains appropriate    Co-evaluation              AM-PAC PT "6 Clicks" Mobility   Outcome Measure  Help needed turning from your back to your side while in a flat bed without using bedrails?: A Little Help needed moving from lying on your back to  sitting on the side of a flat bed without using bedrails?: A Little Help needed moving to and from a bed to a chair (including a wheelchair)?: A Little Help needed standing up from a chair using your arms (e.g., wheelchair or bedside chair)?: A Little Help needed to walk in hospital room?: A Little Help needed climbing 3-5 steps with a railing? : A Lot 6 Click Score: 17    End of Session Equipment Utilized During Treatment: Gait belt Activity Tolerance: Patient tolerated treatment well Patient left: in bed;with call bell/phone within reach   PT Visit Diagnosis: Difficulty in walking, not elsewhere classified (R26.2)     Time: 1583-0940 PT Time Calculation (min) (ACUTE ONLY): 29 min  Charges:  $Gait Training: 8-22 mins $Therapeutic Activity: 8-22 mins                     Vanessa Beard PT, DPT Acute Rehabilitation Services Pager: [phone] Office: [phone]  York Ram E 03/22/2020, 12:32 PM

## 2020-03-22 NOTE — Progress Notes (Addendum)
3 Days Post-Op  Subjective: CC: Doing well. Tolerating regular diet. Had spaghetti for dinner yesterday. No n/v. Having bowel function. Has been working with therapies who are recommending SNF.   Objective: Vital signs in last 24 hours: Temp:  [97.5 F (36.4 C)-98.5 F (36.9 C)] 97.5 F (36.4 C) (11/04 0448) Pulse Rate:  [81-90] 86 (11/04 0448) Resp:  [15-18] 18 (11/04 0448) BP: (92-101)/(61-80) 101/80 (11/04 0448) SpO2:  [95 %-100 %] 100 % (11/04 0448) Last BM Date: 03/19/20  Intake/Output from previous day: 11/03 0701 - 11/04 0700 In: 1740 [P.O.:1740] Out: 610 [Urine:500; Stool:110] Intake/Output this shift: No intake/output data recorded.  PE: Gen: Alert, NAD, pleasant Pulm: Normal rate and effort Abd: Soft,ND, +BS, colostomy in place with some air and formed. Stoma dark red and slightly budded. NT  Lab Results:  Recent Labs    03/20/20 0803 03/20/20 0803 03/21/20 0444 03/22/20 0451  WBC 6.4  --  5.7  --   HGB 8.1*   < > 7.2* 7.2*  HCT 25.0*   < > 23.3* 23.0*  PLT 150  --  125*  --    < > = values in this interval not displayed.   BMET Recent Labs    03/20/20 0803 03/21/20 0444  NA 134* 136  K 4.9 4.6  CL 104 105  CO2 21* 26  GLUCOSE 117* 106*  BUN 16 16  CREATININE 0.71 0.65  CALCIUM 9.2 8.9   PT/INR No results for input(s): LABPROT, INR in the last 72 hours. CMP     Component Value Date/Time   NA 136 03/21/2020 0444   NA 142 08/31/2013 1515   K 4.6 03/21/2020 0444   K 3.4 (L) 08/31/2013 1515   CL 105 03/21/2020 0444   CL 104 07/07/2012 1512   CO2 26 03/21/2020 0444   CO2 27 08/31/2013 1515   GLUCOSE 106 (H) 03/21/2020 0444   GLUCOSE 69 (L) 08/31/2013 1515   GLUCOSE 88 07/07/2012 1512   BUN 16 03/21/2020 0444   BUN 14.7 08/31/2013 1515   CREATININE 0.65 03/21/2020 0444   CREATININE 0.82 02/17/2020 0824   CREATININE 0.87 08/08/2019 1535   CREATININE 0.8 08/31/2013 1515   CALCIUM 8.9 03/21/2020 0444   CALCIUM 9.6 08/31/2013  1515   PROT 7.5 03/03/2020 1545   PROT 8.8 (H) 08/31/2013 1515   ALBUMIN 2.6 (L) 03/03/2020 1545   ALBUMIN 3.7 08/31/2013 1515   AST 23 03/03/2020 1545   AST 22 02/17/2020 0824   AST 25 08/31/2013 1515   ALT 12 03/03/2020 1545   ALT 8 02/17/2020 0824   ALT 9 08/31/2013 1515   ALKPHOS 73 03/03/2020 1545   ALKPHOS 99 08/31/2013 1515   BILITOT 0.7 03/03/2020 1545   BILITOT 0.7 02/17/2020 0824   BILITOT 0.67 08/31/2013 1515   GFRNONAA >60 03/21/2020 0444   GFRNONAA >60 02/17/2020 0824   GFRNONAA 67 08/08/2019 1535   GFRAA >60 02/17/2020 0824   GFRAA 78 08/08/2019 1535   Lipase     Component Value Date/Time   LIPASE 25 11/02/2019 0030       Studies/Results: No results found.  Anti-infectives: Anti-infectives (From admission, onward)   Start     Dose/Rate Route Frequency Ordered Stop   03/19/20 0715  cefoTEtan (CEFOTAN) 2 g in sodium chloride 0.9 % 100 mL IVPB        2 g 200 mL/hr over 30 Minutes Intravenous On call to O.R. 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 Marcello Moores - 03/19/2020 - POD #3 - WOCN following for colostomy teaching  - Mobilize - PT recommending SNF - Tolerating regular diet and having bowel function - Pulm toilet.  FEN -Reg diet. Ensure  VTE -SCDs,Lovenox ID -cefotetan periop Foley - d/c'd 11/2. Plan - Continue colostomy teaching. Patient is doing well, having bowel function and tolerating a diet. Okay for d/c to SNF from our standpoint. We will arrange follow up.   LOS: 10 days    Jillyn Ledger , [facility] Surgery 03/22/2020, 7:44 AM Please see Amion for pager number during day hours  7:00am-4:30pm

## 2020-03-22 NOTE — Progress Notes (Signed)
Pt instructed on how to empty ostomy bag and how to burp it for excess gas. Then had pt demonstrate back to me burping and emptying her own ostomy. Pt verbalized good understanding of principles of above.

## 2020-03-22 NOTE — Discharge Instructions (Signed)
[facility]  Please arrive at least 30 min before your appointment to complete your check in paperwork.  If you are unable to arrive 30 min prior to your appointment time we may have to cancel or reschedule you. LAPAROSCOPIC SURGERY: POST OP INSTRUCTIONS Always review your discharge instruction sheet given to you by the facility where your surgery was performed. IF YOU HAVE DISABILITY OR FAMILY LEAVE FORMS, YOU MUST BRING THEM TO THE OFFICE FOR PROCESSING.   DO NOT GIVE THEM TO YOUR DOCTOR.  PAIN CONTROL  1. First take acetaminophen (Tylenol) AND/or ibuprofen (Advil) to control your pain after surgery.  Follow directions on package.  Taking acetaminophen (Tylenol) and/or ibuprofen (Advil) regularly after surgery will help to control your pain and lower the amount of prescription pain medication you may need.  You should not take more than 4,000 mg (4 grams) of acetaminophen (Tylenol) in 24 hours.  You should not take ibuprofen (Advil), aleve, motrin, naprosyn or other NSAIDS if you have a history of stomach ulcers or chronic kidney disease.  2. A prescription for pain medication may be given to you upon discharge.  Take your pain medication as prescribed, if you still have uncontrolled pain after taking acetaminophen (Tylenol) or ibuprofen (Advil). 3. Use ice packs to help control pain. 4. If you need a refill on your pain medication, please contact your pharmacy.  They will contact our office to request authorization. Prescriptions will not be filled after 5pm or on week-ends.  HOME MEDICATIONS 5. Take your usually prescribed medications unless otherwise directed.  DIET 6. You should follow a light diet the first few days after arrival home.  Be sure to include lots of fluids daily. Avoid fatty, fried foods.   CONSTIPATION 7. It is common to experience some constipation after surgery and if you are taking pain medication.  Increasing fluid intake and taking a stool  softener (such as Colace) will usually help or prevent this problem from occurring.  A mild laxative (Milk of Magnesia or Miralax) should be taken according to package instructions if there are no bowel movements after 48 hours.  WOUND/INCISION CARE 8. Most patients will experience some swelling and bruising in the area of the incisions.  Ice packs will help.  Swelling and bruising can take several days to resolve.  9. Unless discharge instructions indicate otherwise, follow guidelines below  a. STERI-STRIPS - you may remove your outer bandages 48 hours after surgery, and you may shower at that time.  You have steri-strips (small skin tapes) in place directly over the incision.  These strips should be left on the skin for 7-10 days.   b. DERMABOND/SKIN GLUE - you may shower in 24 hours.  The glue will flake off over the next 2-3 weeks. 10. Any sutures or staples will be removed at the office during your follow-up visit.  ACTIVITIES 11. You may resume regular (light) daily activities beginning the next day--such as daily self-care, walking, climbing stairs--gradually increasing activities as tolerated.  You may have sexual intercourse when it is comfortable.  Refrain from any heavy lifting or straining until approved by your doctor. a. You may drive when you are no longer taking prescription pain medication, you can comfortably wear a seatbelt, and you can safely maneuver your car and apply brakes.  FOLLOW-UP 12. You should see your doctor in the office for a follow-up appointment approximately 2-3 weeks after your surgery.  You should have been given your post-op/follow-up appointment when   your surgery was scheduled.  If you did not receive a post-op/follow-up appointment, make sure that you call for this appointment within a day or two after you arrive home to insure a convenient appointment time.   WHEN TO CALL YOUR DOCTOR: 1. Fever over 101.0 2. Inability to urinate 3. Continued bleeding from  incision. 4. Increased pain, redness, or drainage from the incision. 5. Increasing abdominal pain  The clinic staff is available to answer your questions during regular business hours.  Please don't hesitate to call and ask to speak to one of the nurses for clinical concerns.  If you have a medical emergency, go to the nearest emergency room or call 911.  A surgeon from [facility] is always on call at the hospital. [address] [place], [place], [place]  [address] ? [address], [place], [place]   [address] [phone] ? [phone] ? FAX [phone]  Colostomy Home Guide, Adult  Colostomy surgery is done to create an opening in the front of the abdomen for stool (feces) to leave the body through an ostomy (stoma). Part of the large intestine is attached to the stoma. A bag, also called a pouch, is fitted over the stoma. Stool and gas will collect in the bag. After surgery, you will need to empty and change your colostomy bag as needed. You will also need to care for your stoma. How to care for the stoma Your stoma should look pink, red, and moist, like the inside of your cheek. Soon after surgery, the stoma may be swollen, but this swelling will go away within 6 weeks. To care for the stoma:  Keep the skin around the stoma clean and dry.  Use a clean, soft washcloth to gently wash the stoma and the skin around it. Clean using a circular motion, and wipe away from the stoma opening, not toward it. ? Use warm water and only use cleansers recommended by your health care provider. ? Rinse the stoma area with plain water. ? Dry the area around the stoma well.  Use stoma powder or ointment on your skin only as told by your health care provider. Do not use any other powders, gels, wipes, or creams on the skin around the stoma.  Check the stoma area every day for signs of infection. Check for: ? New or worsening redness, swelling, or pain. ? New or increased fluid or  blood. ? Pus or warmth.  Measure the stoma opening regularly and record the size. Watch for changes. (It is normal for the stoma to get smaller as swelling goes away.) Share this information with your health care provider. How to empty the colostomy bag  Empty your bag at bedtime and whenever it is one-third to one-half full. Do not let the bag get more than half-full with stool or gas. The bag could leak if it gets too full. Some colostomy bags have a built-in gas release valve that releases gas often throughout the day. Follow these basic steps: 1. Wash your hands with soap and water. 2. Sit far back on the toilet seat. 3. Put several pieces of toilet paper into the toilet water. This will prevent splashing as you empty stool into the toilet. 4. Remove the clip or the hook-and-loop fastener from the tail end of the bag. 5. Unroll the tail, then empty the stool into the toilet. 6. Clean the tail with toilet paper or a moist towelette. 7. Reroll the tail, and close it with  the clip or the hook-and-loop fastener. 8. Wash your hands again. How to change the colostomy bag Change your bag every 3-4 days or as often as told by your health care provider. Also change the bag if it is leaking or separating from the skin, or if your skin around the stoma looks or feels irritated. Irritated skin may be a sign that the bag is leaking. Always have colostomy supplies with you, and follow these basic steps: 1. Wash your hands with soap and water. Have paper towels or tissues nearby to clean any discharge. 2. Remove the old bag and skin barrier. Use your fingers or a warm cloth to gently push the skin away from the barrier. 3. Clean the stoma area with water or with mild soap and water, as directed. Use water to rinse away any soap. 4. Dry the skin. You may use the cool setting on a hair dryer to do this. 5. Use a tracing pattern (template) to cut the skin barrier to the size needed. 6. If you are using a  two-piece bag, attach the bag and the skin barrier to each other. Add the barrier ring, if you use one. 7. If directed, apply stoma powder or skin barrier gel to the skin. 8. Warm the skin barrier with your hands, or blow with a hair dryer for 5-10 seconds. 9. Remove the paper from the adhesive strip of the skin barrier. 10. Press the adhesive strip onto the skin around the stoma. 11. Gently rub the skin barrier onto the skin. This creates heat that helps the barrier to stick. 12. Apply stoma tape to the edges of the skin barrier, if desired. 26. Wash your hands again. General recommendations  Avoid wearing tight clothes or having anything press directly on your stoma or bag. Change your clothing whenever it is soiled or damp.  You may shower or bathe with the bag on or off. Do not use harsh or oily soaps or lotions. Dry the skin and bag after bathing.  Store all supplies in a cool, dry place. Do not leave supplies in extreme heat because some parts can melt or not stick as well.  Whenever you leave home, take extra clothing and an extra skin barrier and bag with you.  If your bag gets wet, you can dry it with a hair dryer on the cool setting.  To prevent odor, you may put drops of ostomy deodorizer in the bag.  If recommended by your health care provider, put ostomy lubricant inside the bag. This helps stool to slide out of the bag more easily and completely. Contact a health care provider if:  You have new or worsening redness, swelling, or pain around your stoma.  You have new or increased fluid or blood coming from your stoma.  Your stoma feels warm to the touch.  You have pus coming from your stoma.  Your stoma extends in or out farther than normal.  You need to change your bag every day.  You have a fever. Get help right away if:  Your stool is bloody.  You have nausea or you vomit.  You have trouble breathing. Summary  Measure your stoma opening regularly and  record the size. Watch for changes.  Empty your bag at bedtime and whenever it is one-third to one-half full. Do not let the bag get more than half-full with stool or gas.  Change your bag every 3-4 days or as often as told by your health care provider.  Whenever you leave home, take extra clothing and an extra skin barrier and bag with you. This information is not intended to replace advice given to you by your health care provider. Make sure you discuss any questions you have with your health care provider. Document Revised: 08/25/2018 Document Reviewed: 10/29/2016 Elsevier Patient Education  [place].   Colostomy Surgery, Adult, Care After  This sheet gives you information about how to care for yourself after your procedure. Your health care provider may also give you more specific instructions. If you have problems or questions, contact your health care provider. What can I expect after the procedure? After the procedure, it is common to have:  Swelling at the opening that was created during the procedure (stoma).  Slight bleeding around the stoma.  Redness around the stoma. Follow these instructions at home: Activity  Rest as needed while the stoma area heals.  Return to your normal activities as told by your health care provider. Ask your health care provider what activities are safe for you.  Avoid strenuous activity and abdominal exercises for 3 weeks or for as long as told by your health care provider.  Do not lift anything that is heavier than 10 lb (4.5 kg), or the limit that you are told, until your health care provider says that it is safe. Incision care  Follow instructions from your health care provider about how to take care of your incision. Make sure you: ? Wash your hands with soap and water before you change your bandage (dressing). If soap and water are not available, use hand sanitizer. ? Change your dressing as told by your health care  provider. ? Leave stitches (sutures), skin glue, or adhesive strips in place. These skin closures may need to stay in place for 2 weeks or longer. If adhesive strip edges start to loosen and curl up, you may trim the loose edges. Do not remove adhesive strips completely unless your health care provider tells you to do that. Stoma care  Keep the stoma area clean.  Clean and dry the skin around the stoma each time you change the colostomy bag. To clean the stoma area: ? Use warm water and only use cleansers that are recommended by your health care provider. ? Rinse the stoma area with plain water. ? Dry the area well.  Use stoma powder or skin barrier film on your skin only as told by your health care provider. Do not use any other powders, gels, wipes, or creams on the skin around the stoma.  Check the stoma area every day for signs of infection. Check for: ? More redness, swelling, or pain. ? More fluid or blood. ? Pus or warmth.  Measure the stoma opening regularly and record the size. Watch for changes. Share this information with your health care provider. Bathing  Do not take baths, swim, or use a hot tub until your health care provider approves. Ask your health care provider if you may take showers. You may be able to shower with or without the colostomy bag in place. If you bathe with the bag on, dry the bag afterward.  Avoid using harsh or oily soaps when you bathe. Colostomy bag care  Follow instructions from your health care provider about how to empty or change the colostomy bag.  Keep colostomy supplies with you at all times.  Store all supplies in a cool, dry place.  Empty the colostomy bag: ? Whenever it is one-third to one-half full. ?  At bedtime.  Replace the bag every 3-4 days for the first 6 weeks, then every 4-7 days. Driving  Follow driving restrictions as told by your health care provider.  Do not drive or use heavy machinery while taking prescription pain  medicine. General instructions  Follow instructions from your health care provider about eating or drinking restrictions.  Take over-the-counter and prescription medicines only as told by your health care provider.  Avoid wearing clothes that are tight directly over your stoma area.  Do not use any products that contain nicotine or tobacco, such as cigarettes and e-cigarettes. If you need help quitting, ask your health care provider.  If you are a woman, ask your health care provider about becoming pregnant and about using birth control. Medicines may not be absorbed normally after the procedure.  Keep all follow-up visits as told by your health care provider. This is important. Contact a health care provider if you have:  Trouble caring for your stoma or changing the colostomy bag.  Nausea or vomiting.  A fever.  More redness, swelling, or pain at the site of your stoma or around your anus.  More fluid or blood coming from your stoma or your anus.  Warmth around your stoma area.  Pus coming from your stoma.  A change in the size or appearance of the stoma.  Abdominal pain, bloating, pressure, or cramping.  Stool more often or less often than your health care provider tells you to expect.  Very little urine production. This may be a sign of dehydration. Get help right away if you have:  Abdominal pain that does not go away or becomes severe.  Frequent vomiting.  No stool draining through the stoma.  Chest pain or an irregular heartbeat. Summary  Follow instructions from your health care provider about how to take care of your incision and stoma.  Contact a health care provider if you have trouble caring for your stoma or changing the colostomy bag.  Get help right away if you have abdominal pain that does not go away or becomes severe or if you have no stool draining through the stoma.  Keep all follow-up visits as told by your health care provider. This is  important. This information is not intended to replace advice given to you by your health care provider. Make sure you discuss any questions you have with your health care provider. Document Revised: 09/01/2017 Document Reviewed: 09/01/2017 Elsevier Patient Education  [place]: POST OP INSTRUCTIONS (Surgery for small bowel obstruction, colon resection, etc)   ######################################################################  EAT Gradually transition to a high fiber diet with a fiber supplement over the next few days after discharge  WALK Walk an hour a day.  Control your pain to do that.    CONTROL PAIN Control pain so that you can walk, sleep, tolerate sneezing/coughing, go up/down stairs.  HAVE A BOWEL MOVEMENT DAILY Keep your bowels regular to avoid problems.  OK to try a laxative to override constipation.  OK to use an antidairrheal to slow down diarrhea.  Call if not better after 2 tries  CALL IF YOU HAVE PROBLEMS/CONCERNS Call if you are still struggling despite following these instructions. Call if you have concerns not answered by these instructions  ######################################################################   DIET Follow a light diet the first few days at home.  Start with a bland diet such as soups, liquids, starchy foods, low fat foods, etc.  If you feel full, bloated, or constipated, stay on a  ful liquid or pureed/blenderized diet for a few days until you feel better and no longer constipated. Be sure to drink plenty of fluids every day to avoid getting dehydrated (feeling dizzy, not urinating, etc.). Gradually add a fiber supplement to your diet over the next week.  Gradually get back to a regular solid diet.  Avoid fast food or heavy meals the first week as you are more likely to get nauseated. It is expected for your digestive tract to need a few months to get back to normal.  It is common for your bowel movements and stools to be  irregular.  You will have occasional bloating and cramping that should eventually fade away.  Until you are eating solid food normally, off all pain medications, and back to regular activities; your bowels will not be normal. Focus on eating a low-fat, high fiber diet the rest of your life (See Getting to [place], below).  CARE of your INCISION or WOUND It is good for closed incision and even open wounds to be washed every day.  Shower every day.  Short baths are fine.  Wash the incisions and wounds clean with soap & water.    If you have a closed incision(s), wash the incision with soap & water every day.  You may leave closed incisions open to air if it is dry.   You may cover the incision with clean gauze & replace it after your daily shower for comfort. If you have skin tapes (Steristrips) or skin glue (Dermabond) on your incision, leave them in place.  They will fall off on their own like a scab.  You may trim any edges that curl up with clean scissors.  If you have staples, set up an appointment for them to be removed in the office in 10 days after surgery.  If you have a drain, wash around the skin exit site with soap & water and place a new dressing of gauze or band aid around the skin every day.  Keep the drain site clean & dry.    If you have an open wound with packing, see wound care instructions.  In general, it is encouraged that you remove your dressing and packing, shower with soap & water, and replace your dressing once a day.  Pack the wound with clean gauze moistened with normal (0.9%) saline to keep the wound moist & uninfected.  Pressure on the dressing for 30 minutes will stop most wound bleeding.  Eventually your body will heal & pull the open wound closed over the next few months.  Raw open wounds will occasionally bleed or secrete yellow drainage until it heals closed.  Drain sites will drain a little until the drain is removed.  Even closed incisions can have mild bleeding  or drainage the first few days until the skin edges scab over & seal.   If you have an open wound with a wound vac, see wound vac care instructions.     ACTIVITIES as tolerated Start light daily activities --- self-care, walking, climbing stairs-- beginning the day after surgery.  Gradually increase activities as tolerated.  Control your pain to be active.  Stop when you are tired.  Ideally, walk several times a day, eventually an hour a day.   Most people are back to most day-to-day activities in a few weeks.  It takes 4-8 weeks to get back to unrestricted, intense activity. If you can walk 30 minutes without difficulty, it is safe to  try more intense activity such as jogging, treadmill, bicycling, low-impact aerobics, swimming, etc. Save the most intensive and strenuous activity for last (Usually 4-8 weeks after surgery) such as sit-ups, heavy lifting, contact sports, etc.  Refrain from any intense heavy lifting or straining until you are off narcotics for pain control.  You will have off days, but things should improve week-by-week. DO NOT PUSH THROUGH PAIN.  Let pain be your guide: If it hurts to do something, don't do it.  Pain is your body warning you to avoid that activity for another week until the pain goes down. You may drive when you are no longer taking narcotic prescription pain medication, you can comfortably wear a seatbelt, and you can safely make sudden turns/stops to protect yourself without hesitating due to pain. You may have sexual intercourse when it is comfortable. If it hurts to do something, stop.  MEDICATIONS Take your usually prescribed home medications unless otherwise directed.   Blood thinners:  Usually you can restart any strong blood thinners after the second postoperative day.  It is OK to take aspirin right away.     If you are on strong blood thinners (warfarin/Coumadin, Plavix, Xerelto, Eliquis, Pradaxa, etc), discuss with your surgeon, medicine PCP, and/or  cardiologist for instructions on when to restart the blood thinner & if blood monitoring is needed (PT/INR blood check, etc).     PAIN CONTROL Pain after surgery or related to activity is often due to strain/injury to muscle, tendon, nerves and/or incisions.  This pain is usually short-term and will improve in a few months.  To help speed the process of healing and to get back to regular activity more quickly, DO THE FOLLOWING THINGS TOGETHER: 13. Increase activity gradually.  DO NOT PUSH THROUGH PAIN 14. Use Ice and/or Heat 15. Try Gentle Massage and/or Stretching 16. Take over the counter pain medication 17. Take Narcotic prescription pain medication for more severe pain  Good pain control = faster recovery.  It is better to take more medicine to be more active than to stay in bed all day to avoid medications. 6.  Increase activity gradually Avoid heavy lifting at first, then increase to lifting as tolerated over the next 6 weeks. Do not "push through" the pain.  Listen to your body and avoid positions and maneuvers than reproduce the pain.  Wait a few days before trying something more intense Walking an hour a day is encouraged to help your body recover faster and more safely.  Start slowly and stop when getting sore.  If you can walk 30 minutes without stopping or pain, you can try more intense activity (running, jogging, aerobics, cycling, swimming, treadmill, sex, sports, weightlifting, etc.) Remember: If it hurts to do it, then don't do it! 7. Use Ice and/or Heat You will have swelling and bruising around the incisions.  This will take several weeks to resolve. Ice packs or heating pads (6-8 times a day, 30-60 minutes at a time) will help sooth soreness & bruising. Some people prefer to use ice alone, heat alone, or alternate between ice & heat.  Experiment and see what works best for you.  Consider trying ice for the first few days to help decrease swelling and bruising; then, switch to  heat to help relax sore spots and speed recovery. Shower every day.  Short baths are fine.  It feels good!  Keep the incisions and wounds clean with soap & water.   8. Try Gentle Massage and/or Stretching Massage  at the area of pain many times a day Stop if you feel pain - do not overdo it 9. Take over the counter pain medication This helps the muscle and nerve tissues become less irritable and calm down faster Choose ONE of the following over-the-counter anti-inflammatory medications: Acetaminophen 500mg  tabs (Tylenol) 1-2 pills with every meal and just before bedtime (avoid if you have liver problems or if you have acetaminophen in you narcotic prescription) Naproxen 220mg  tabs (ex. Aleve, Naprosyn) 1-2 pills twice a day (avoid if you have kidney, stomach, IBD, or bleeding problems) Ibuprofen 200mg  tabs (ex. Advil, Motrin) 3-4 pills with every meal and just before bedtime (avoid if you have kidney, stomach, IBD, or bleeding problems) Take with food/snack several times a day as directed for at least 2 weeks to help keep pain / soreness down & more manageable. 10. Take Narcotic prescription pain medication for more severe pain A prescription for strong pain control is often given to you upon discharge (for example: oxycodone/Percocet, hydrocodone/Norco/Vicodin, or tramadol/Ultram) Take your pain medication as prescribed. Be mindful that most narcotic prescriptions contain Tylenol (acetaminophen) as well - avoid taking too much Tylenol. If you are having problems/concerns with the prescription medicine (does not control pain, nausea, vomiting, rash, itching, etc.), please call us [phone] to see if we need to switch you to a different pain medicine that will work better for you and/or control your side effects better. If you need a refill on your pain medication, you must call the office before 4 pm and on weekdays only.  By federal law, prescriptions for narcotics cannot be called into a  pharmacy.  They must be filled out on paper & picked up from our office by the patient or authorized caretaker.  Prescriptions cannot be filled after 4 pm nor on weekends.    WHEN TO CALL us [phone] Severe uncontrolled or worsening pain  Fever over 101 F (38.5 C) Concerns with the incision: Worsening pain, redness, rash/hives, swelling, bleeding, or drainage Reactions / problems with new medications (itching, rash, hives, nausea, etc.) Nausea and/or vomiting Difficulty urinating Difficulty breathing Worsening fatigue, dizziness, lightheadedness, blurred vision Other concerns If you are not getting better after two weeks or are noticing you are getting worse, contact our office [phone] for further advice.  We may need to adjust your medications, re-evaluate you in the office, send you to the emergency room, or see what other things we can do to help. The clinic staff is available to answer your questions during regular business hours (8:30am-5pm).  Please don't hesitate to call and ask to speak to one of our nurses for clinical concerns.    A surgeon from [facility] is always on call at the hospitals 24 hours/day If you have a medical emergency, go to the nearest emergency room or call 911.  FOLLOW UP in our office One the day of your discharge from the hospital (or the next business weekday), please call [facility] to set up or confirm an appointment to see your surgeon in the office for a follow-up appointment.  Usually it is 2-3 weeks after your surgery.   If you have skin staples at your incision(s), let the office know so we can set up a time in the office for the nurse to remove them (usually around 10 days after surgery). Make sure that you call for appointments the day of discharge (or the next business weekday) from the hospital to ensure a convenient  appointment time. IF YOU HAVE DISABILITY OR FAMILY LEAVE FORMS, BRING THEM TO THE OFFICE FOR  PROCESSING.  DO NOT GIVE THEM TO YOUR DOCTOR.  [facility] [address], [place], [place], [place]  [address] ? [phone] - Main [phone] - [place],  [phone] - Fax [email or web address]  GETTING TO GOOD BOWEL HEALTH. It is expected for your digestive tract to need a few months to get back to normal.  It is common for your bowel movements and stools to be irregular.  You will have occasional bloating and cramping that should eventually fade away.  Until you are eating solid food normally, off all pain medications, and back to regular activities; your bowels will not be normal.   Avoiding constipation The goal: ONE SOFT BOWEL MOVEMENT A DAY!    Drink plenty of fluids.  Choose water first. TAKE A FIBER SUPPLEMENT EVERY DAY THE REST OF YOUR LIFE During your first week back home, gradually add back a fiber supplement every day Experiment which form you can tolerate.   There are many forms such as powders, tablets, wafers, gummies, etc Psyllium bran (Metamucil), methylcellulose (Citrucel), Miralax or Glycolax, Benefiber, Flax Seed.  Adjust the dose week-by-week (1/2 dose/day to 6 doses a day) until you are moving your bowels 1-2 times a day.  Cut back the dose or try a different fiber product if it is giving you problems such as diarrhea or bloating. Sometimes a laxative is needed to help jump-start bowels if constipated until the fiber supplement can help regulate your bowels.  If you are tolerating eating & you are farting, it is okay to try a gentle laxative such as double dose MiraLax, prune juice, or Milk of Magnesia.  Avoid using laxatives too often. Stool softeners can sometimes help counteract the constipating effects of narcotic pain medicines.  It can also cause diarrhea, so avoid using for too long. If you are still constipated despite taking fiber daily, eating solids, and a few doses of laxatives, call our office. Controlling  diarrhea Try drinking liquids and eating bland foods for a few days to avoid stressing your intestines further. Avoid dairy products (especially milk & ice cream) for a short time.  The intestines often can lose the ability to digest lactose when stressed. Avoid foods that cause gassiness or bloating.  Typical foods include beans and other legumes, cabbage, broccoli, and dairy foods.  Avoid greasy, spicy, fast foods.  Every person has some sensitivity to other foods, so listen to your body and avoid those foods that trigger problems for you. Probiotics (such as active yogurt, Align, etc) may help repopulate the intestines and colon with normal bacteria and calm down a sensitive digestive tract Adding a fiber supplement gradually can help thicken stools by absorbing excess fluid and retrain the intestines to act more normally.  Slowly increase the dose over a few weeks.  Too much fiber too soon can backfire and cause cramping & bloating. It is okay to try and slow down diarrhea with a few doses of antidiarrheal medicines.   Bismuth subsalicylate (ex. Kayopectate, Pepto Bismol) for a few doses can help control diarrhea.  Avoid if pregnant.   Loperamide (Imodium) can slow down diarrhea.  Start with one tablet (2mg ) first.  Avoid if you are having fevers or severe pain.  ILEOSTOMY PATIENTS WILL HAVE CHRONIC DIARRHEA since their colon is not in use.    Drink plenty of liquids.  You will need to drink even  more glasses of water/liquid a day to avoid getting dehydrated. Record output from your ileostomy.  Expect to empty the bag every 3-4 hours at first.  Most people with a permanent ileostomy empty their bag 4-6 times at the least.   Use antidiarrheal medicine (especially Imodium) several times a day to avoid getting dehydrated.  Start with a dose at bedtime & breakfast.  Adjust up or down as needed.  Increase antidiarrheal medications as directed to avoid emptying the bag more than 8 times a day (every 3  hours). Work with your wound ostomy nurse to learn care for your ostomy.  See ostomy care instructions. TROUBLESHOOTING IRREGULAR BOWELS 1) Start with a soft & bland diet. No spicy, greasy, or fried foods.  2) Avoid gluten/wheat or dairy products from diet to see if symptoms improve. 3) Miralax 17gm or flax seed mixed in [place]. water or juice-daily. May use 2-4 times a day as needed. 4) Gas-X, Phazyme, etc. as needed for gas & bloating.  5) Prilosec (omeprazole) over-the-counter as needed 6)  Consider probiotics (Align, Activa, etc) to help calm the bowels down  Call your doctor if you are getting worse or not getting better.  Sometimes further testing (cultures, endoscopy, X-ray studies, CT scans, bloodwork, etc.) may be needed to help diagnose and treat the cause of the diarrhea. [facility], [place], [place], [place], [place]  [address] [phone] - Main.    [phone]  - Toll Free.   [phone] - Fax [email or web address]

## 2020-03-22 NOTE — Progress Notes (Signed)
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  patient opted for conservative management. It was recommended she go to a skilled nursing facility, but patient declined this and she was discharged 10/24. 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    OT comments  Patient overall min G for functional mobility and ambulation in room, declined use of rolling walker. Patient requires increased time for all mobility due to pain, pt in flexed posture throughout. Supervision at sink to perform g/h then transfer to recliner. Assist patient with bracing abdomen with towel + gait belt to try and alleviate pain. RN aware pt pain level.    Follow Up Recommendations  SNF    Equipment Recommendations  Other (comment) (defer to next venue)       Precautions / Restrictions Precautions Precautions: Fall Precaution Comments: new ostomy       Mobility Bed Mobility Overal bed mobility: Needs Assistance Bed Mobility: Sidelying to Sit   Sidelying to sit: Supervision;HOB elevated       General bed mobility comments: patient requiring increased time and effort to perform bed mobility, provided cues for log roll pt reply "let me do it on my own time"  Transfers Overall transfer level: Needs assistance Equipment used: None Transfers: Sit to/from Stand Sit to Stand: Min guard         General transfer comment: increased time for all mobility, min G for safety due to flexed posture and reaching out for furniture to stabilize, declined use of  walker    Balance Overall balance assessment: Needs assistance Sitting-balance support: No upper extremity supported Sitting balance-Leahy Scale: Fair     Standing balance support: Single extremity supported Standing balance-Leahy Scale: Poor Standing balance comment: dependent on at least  UE support                           ADL either performed or assessed with clinical judgement   ADL Overall ADL's : Needs assistance/impaired     Grooming: Wash/dry face;Wash/dry hands;Oral care;Supervision/safety;Standing Grooming Details (indicate cue type and reason): leans heavily onto sink for support, pain from colostomy site                 Toilet Transfer: Min guard;Ambulation Toilet Transfer Details (indicate cue type and reason): simulated with functional ambulation to sink few steps then to recliner. requires increased time and reaches out for objects to stabilize declined use of walker         Functional mobility during ADLs: Min guard General ADL Comments: decrerased activity tolerance due to pain               Cognition Arousal/Alertness: Awake/alert Behavior During Therapy: WFL for tasks assessed/performed Overall Cognitive Status: Within Functional Limits for tasks assessed  Pertinent Vitals/ Pain       Pain Assessment: Faces Faces Pain Scale: Hurts even more Pain Location: colostomy site Pain Descriptors / Indicators: Grimacing;Guarding;Sore Pain Intervention(s): Monitored during session;Patient requesting pain meds-RN notified         Frequency  Min 2X/week        Progress Toward Goals  OT Goals(current goals can now be found in the care plan section)  Progress towards OT goals: Progressing toward goals  Acute Rehab OT Goals Patient Stated Goal: brush teeth OT Goal Formulation: With patient Time For Goal Achievement: 03/27/20 Potential to Achieve Goals:  Good  Plan Discharge plan remains appropriate;Frequency remains appropriate       AM-PAC OT "6 Clicks" Daily Activity     Outcome Measure   Help from another person eating meals?: None Help from another person taking care of personal grooming?: A Little Help from another person toileting, which includes using toliet, bedpan, or urinal?: A Little Help from another person bathing (including washing, rinsing, drying)?: A Little Help from another person to put on and taking off regular upper body clothing?: A Little Help from another person to put on and taking off regular lower body clothing?: A Lot 6 Click Score: 18    End of Session Equipment Utilized During Treatment: Gait belt  OT Visit Diagnosis: Muscle weakness (generalized) (M62.81);Pain;History of falling (Z91.81);Repeated falls (R29.6) Pain - part of body:  (abdomen)   Activity Tolerance Patient limited by pain   Patient Left in chair;with call bell/phone within reach;with family/visitor present   Nurse Communication Mobility status;Other (comment) (pain )        Time: 8341-9622 OT Time Calculation (min): 24 min  Charges: OT General Charges $OT Visit: 1 Visit OT Treatments $Self Care/Home Management : 23-37 mins  Delbert Phenix OT OT pager: [phone]   Rosemary Holms 03/22/2020, 12:13 PM

## 2020-03-22 NOTE — Progress Notes (Signed)
Daily Progress Note   Patient Name: Vanessa Beard       Date: 03/22/2020 DOB: 06-23-1950  Age: 69 y.o. MRN#: [id number] Attending Physician: Tawni Millers Primary Care Physician: Seward Carol, MD Admit Date: 03/12/2020  Reason for Consultation/Follow-up: Establishing goals of care  Subjective: I saw and examined Vanessa Beard today.  She is working on Immunologist to change her ostomy when I visited with her.  Reports that overall pain is fairly well controlled today.  We discussed pain management recommendations including recommendation to pretreat 30 minutes prior to activities that cause her pain.  She is also complaining of a lot of gas pain today.  Discussed trial of simethicone.     Length of Stay: 10  Current Medications: Scheduled Meds:  . abacavir-dolutegravir-lamiVUDine  1 tablet Oral Daily  . Chlorhexidine Gluconate Cloth  6 each Topical Daily  . DULoxetine  20 mg Oral Daily  . enoxaparin (LOVENOX) injection  40 mg Subcutaneous Daily  . feeding supplement  237 mL Oral BID BM  . fentaNYL  1 patch Transdermal Q72H  . gabapentin  600 mg Oral BID  . lactose free nutrition  237 mL Oral TID WC  . liver oil-zinc oxide   Topical BID  . senna-docusate  1 tablet Oral BID  . sucralfate  1 g Oral BID    Continuous Infusions:   PRN Meds: acetaminophen, belladonna-opium, butamben-tetracaine-benzocaine, HYDROmorphone (DILAUDID) injection, HYDROmorphone, polyethylene glycol, simethicone, zolpidem  Physical Exam         No distress.  Awake and alert and interactive.   Regular work of breathing Abdomen not distended, deferred ostomy exam Non focal No edema S1 S 2   Vital Signs: BP (!) 98/59 (BP Location: Left Arm)   Pulse 89   Temp 98.3 F (36.8 C) (Oral)   Resp  16   Ht 5' (1.524 m)   Wt 58.5 kg   SpO2 98%   BMI 25.19 kg/m  SpO2: SpO2: 98 % O2 Device: O2 Device: Room Air O2 Flow Rate: O2 Flow Rate (L/min): 2 L/min  Intake/output summary:   Intake/Output Summary (Last 24 hours) at 03/22/2020 1731 Last data filed at 03/22/2020 1400 Gross per 24 hour  Intake 1260 ml  Output 700 ml  Net 560 ml   LBM: Last BM Date: 03/22/20 Baseline Weight:  Weight: 58.5 kg Most recent weight: Weight: 58.5 kg      PPS 50% Palliative Assessment/Data:    Flowsheet Rows     Most Recent Value  Intake Tab  Referral Department Hospitalist  Unit at Time of Referral Oncology Unit  Palliative Care Primary Diagnosis Cancer  Date Notified 03/13/20  Palliative Care Type Return patient Palliative Care  Reason for referral Pain  Date of Admission 03/12/20  Date first seen by Palliative Care 03/13/20  # of days Palliative referral response time 0 Day(s)  # of days IP prior to Palliative referral 1  Clinical Assessment  Psychosocial & Spiritual Assessment  Palliative Care Outcomes      Patient Active Problem List   Diagnosis Date Noted  . Palliative care by specialist   . General weakness   . Protein-calorie malnutrition, severe 03/05/2020  . Palliative care encounter 01/27/2020  . Goals of care, counseling/discussion 01/16/2020  . Hemorrhoid 12/16/2019  . Rectal pain 12/16/2019  . Anal cancer ([place]) 07/26/2019  . Rectal mass 07/14/2019  . Constipation 07/14/2019  . Anal fissure 07/14/2019  . Healthcare maintenance 06/30/2018  . Left knee pain 12/30/2017  . Left shoulder pain 12/30/2017  . Closed fracture of right tibial plateau 02/18/2017  . Benign hypertension 02/15/2017  . Screening examination for venereal disease 03/06/2015  . Encounter for long-term (current) use of medications 03/06/2015  . Elevated LFTs 12/11/2014  . Abnormal ultrasound 12/11/2014  . Abdominal pain, epigastric 12/11/2014  . ASCUS of cervix with negative high risk HPV  10/31/2013  . Gall bladder disease 02/03/2013  . 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  . Compression fracture of L3 lumbar vertebra 07/01/2011  . Thyroid dysfunction 07/01/2011  . Anemia 07/01/2011  . Sore throat 04/02/2011  . Hyperparathyroidism 02/10/2011  . Osteopenia 02/03/2011  . PAP SMEAR, LGSIL, ABNORMAL 12/21/2009  . COUGH 07/18/2009  . Chronic hepatitis C virus infection ([place]) 12/19/2008  . LEUKORRHEA 04/07/2008  . FATIGUE 04/07/2008  . BACK PAIN, LUMBAR 11/10/2007  . ALLERGIC RHINITIS 07/09/2007  . PNEUMOCYSTIS PNEUMONIA 05/07/2007  . Essential hypertension 05/07/2007  . DENTAL CARIES 05/07/2007  . INSOMNIA 05/07/2007  . PNEUMONIA, HX OF 05/07/2007  . HERPES ZOSTER, HX OF 05/07/2007  . Human immunodeficiency virus (HIV) disease ([place]) 08/04/2006    Palliative Care Assessment & Plan   Patient Profile:    Assessment:  squamous cell ca anus Severe pain Necrotic anorectal mass Has history of HIV HTN  Recommendations/Plan: Status post diverting ostomy .  Patient agrees she would be best served to pursue placement at [facility] for short term rehab.  TOC aware and appreciate assistance.   Pain management: Reports somewhat better today.  Continue fentanyl 75 mcg/h.  Encouraged her to utilize oral medication for breakthrough to ensure that it is going to be effective to control her pain on discharge.  Also discussed with her a plan to pretreat with rescue medication 30 minutes prior to activities that cause pain.  Code Status:    Code Status Orders  (From admission, onward)         Start     Ordered   03/12/20 0940  Do not attempt resuscitation (DNR)  Continuous       Question Answer Comment  In the event of cardiac or respiratory ARREST Do not call a "code blue"   In the event of cardiac or respiratory ARREST Do not perform Intubation, CPR, defibrillation or ACLS   In the event of cardiac or respiratory ARREST Use  medication by any route,  position, wound care, and other measures to relive pain and suffering. May use oxygen, suction and manual treatment of airway obstruction as needed for comfort.      03/12/20 0939        Code Status History    Date Active Date Inactive Code Status Order ID Comments User Context   03/03/2020 1955 03/11/2020 2230 DNR [id number]  Lenore Cordia, MD ED   02/15/2017 2327 02/21/2017 1728 Full Code [id number]  Jani Gravel, MD Inpatient   Advance Care Planning Activity      Prognosis:  Unable to determine  Discharge Planning: [facility] for rehab with Palliative care service follow-up  Care plan was discussed with  Patient, friend carol via phone     Thank you for allowing the Palliative Medicine Team to assist in the care of this patient.   Time In: 1540 Time Out: 1600 Total Time 20 Prolonged Time Billed  no    Greater than 50%  of this time was spent counseling and coordinating care related to the above assessment and plan.  Micheline Rough, MD  Please contact Palliative Medicine Team phone at [phone] for questions and concerns.

## 2020-03-22 NOTE — Progress Notes (Signed)
PROGRESS NOTE    Vanessa Beard  HRC:[id number] DOB: 1951-03-03 DOA: 03/12/2020 PCP: Seward Carol, MD    Brief Narrative:  Mrs. Cercone was admitted to the hospital with a working diagnosis of worsening anorectal squamous cell carcinoma. Now sp diverting colostomy   69 year old female with past medical history for hypertension, HIV, severe protein calorie malnutrition and anorectalsquamous cell carcinoma. Patient was readmitted 24 hours after discharge from the hospital. On that priorhospitalization patient was diagnosed with necrotic anal rectal mass, she declined palliative diverting colostomy. She underwent IR guided nerve block and was discharged with oral analgesics. At home her pain became intractable that prompted her to come back to the hospital. On her initial physical examination blood pressure 121/79, heart rate 62, temperature 98.2, respiratory rate 11, oxygen saturation 96%, her lungs were clear to auscultation bilaterally, abdomen was soft nontender, no lower extremity edema.  Patient was placed on analgesic regimen and surgery was consulted. She underwent palliative diverting colostomy 03/19/2020.  Palliative care services have been consulted, patient will be discharged  to rehab toattempt toregain functional status.  She had severe and persistent pain, required frequent adjustment of analgesics regimen.   Assessment & Plan:   Principal Problem:   Anal cancer ([place]) Active Problems:   Human immunodeficiency virus (HIV) disease ([place])   Chronic hepatitis C virus infection ([place])   Essential hypertension   Protein-calorie malnutrition, severe   Palliative care by specialist   General weakness    1. 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 SP laparoscopic ostomy placement on 11/01.  Her abdominal pain has improved, positive bowel movements.  Stable Hgb at 7,2 and Hct at 23.0.   Pain control  with fentanyl patch 50 mcg, plus as needed IV hydromorphone.  Continue with gabapentin 600 mg bid and bowel regimen with miralax and senna.   Continue local ostomy care. Patient is tolerating po well.   2. HIV. Continue with antiretroviral therapy.   3. Depression. Continue with duloxetine.   4. Severe calorie protein malnutrition. On nutritional supplements.  5. Hypokalemia. electrolytes have been corrected and patient is tolerating po well.  6. GERD. Continue with sucralfate.     Status is: Inpatient  Remains inpatient appropriate because:Inpatient level of care appropriate due to severity of illness   Dispo: The patient is from: Home              Anticipated d/c is to: SNF              Anticipated d/c date is: 1 day              Patient currently is not medically stable to d/c.   DVT prophylaxis: Enoxaparin   Code Status:   dnr   Family Communication:  No family at the bedside     Nutrition Status: Nutrition Problem: Severe Malnutrition Etiology: chronic illness, cancer and cancer related treatments Signs/Symptoms: percent weight loss, severe fat depletion, severe muscle depletion Percent weight loss: 16 % (x 2.5 months) Interventions: Boost Plus, MVI, Magic cup     Consultants:   Surgery   Palliative care  Procedures:   Diverting colostomy      Subjective: Patient is feeling better today, her pain has improved, no nausea or vomiting, positive bowel movement.  Last night at midnight had episode of severe pain but not this morning.   Objective: Vitals:   03/21/20 1317 03/21/20 2146 03/22/20 0448 03/22/20 1317  BP: 95/65 92/61 101/80 Marland Kitchen)  98/59  Pulse: 81 90 86 89  Resp: 16 15 18 16   Temp: 98.5 F (36.9 C) 98.1 F (36.7 C) (!) 97.5 F (36.4 C) 98.3 F (36.8 C)  TempSrc: Oral Oral Oral Oral  SpO2: 99% 95% 100% 98%  Weight:      Height:        Intake/Output Summary (Last 24 hours) at 03/22/2020 1339 Last data filed at 03/22/2020  1317 Gross per 24 hour  Intake 1500 ml  Output 700 ml  Net 800 ml   Filed Weights   03/12/20 0550  Weight: 58.5 kg    Examination:   General: deconditioned  Neurology: Awake and alert, non focal  E ENT: mild pallor, no icterus, oral mucosa moist Cardiovascular: No JVD. S1-S2 present, rhythmic, no gallops, rubs, or murmurs. No lower extremity edema. Pulmonary: positive breath sounds bilaterally, adequate air movement, no wheezing, rhonchi or rales. Gastrointestinal. Abdomen soft and non tender. Colostomy bag in place with soft brown stool.  Skin. No rashes Musculoskeletal: no joint deformities     Data Reviewed: I have personally reviewed following labs and imaging studies  CBC: Recent Labs  Lab 03/20/20 0803 03/21/20 0444 03/22/20 0451  WBC 6.4 5.7  --   NEUTROABS  --  4.6  --   HGB 8.1* 7.2* 7.2*  HCT 25.0* 23.3* 23.0*  MCV 98.0 102.6*  --   PLT 150 125*  --    Basic Metabolic Panel: Recent Labs  Lab 03/19/20 0456 03/20/20 0803 03/21/20 0444  NA  --  134* 136  K  --  4.9 4.6  CL  --  104 105  CO2  --  21* 26  GLUCOSE  --  117* 106*  BUN  --  16 16  CREATININE 0.61 0.71 0.65  CALCIUM  --  9.2 8.9   GFR: Estimated Creatinine Clearance: 51.6 mL/min (by C-G formula based on SCr of 0.65 mg/dL). Liver Function Tests: No results for input(s): AST, ALT, ALKPHOS, BILITOT, PROT, ALBUMIN in the last 168 hours. No results for input(s): LIPASE, AMYLASE in the last 168 hours. No results for input(s): AMMONIA in the last 168 hours. Coagulation Profile: No results for input(s): INR, PROTIME in the last 168 hours. Cardiac Enzymes: No results for input(s): CKTOTAL, CKMB, CKMBINDEX, TROPONINI in the last 168 hours. BNP (last 3 results) No results for input(s): PROBNP in the last 8760 hours. HbA1C: No results for input(s): HGBA1C in the last 72 hours. CBG: No results for input(s): GLUCAP in the last 168 hours. Lipid Profile: No results for input(s): CHOL, HDL,  LDLCALC, TRIG, CHOLHDL, LDLDIRECT in the last 72 hours. Thyroid Function Tests: No results for input(s): TSH, T4TOTAL, FREET4, T3FREE, THYROIDAB in the last 72 hours. Anemia Panel: No results for input(s): VITAMINB12, FOLATE, FERRITIN, TIBC, IRON, RETICCTPCT in the last 72 hours.    Radiology Studies: I have reviewed all of the imaging during this hospital visit personally     Scheduled Meds: . abacavir-dolutegravir-lamiVUDine  1 tablet Oral Daily  . Chlorhexidine Gluconate Cloth  6 each Topical Daily  . DULoxetine  20 mg Oral Daily  . enoxaparin (LOVENOX) injection  40 mg Subcutaneous Daily  . feeding supplement  237 mL Oral BID BM  . fentaNYL  1 patch Transdermal Q72H  . gabapentin  600 mg Oral BID  . lactose free nutrition  237 mL Oral TID WC  . liver oil-zinc oxide   Topical BID  . senna-docusate  1 tablet Oral BID  . sucralfate  1 g Oral BID   Continuous Infusions:   LOS: 10 days        Tariyah Pendry Gerome Apley, MD

## 2020-03-23 DIAGNOSIS — C210 Malignant neoplasm of anus, unspecified: Secondary | ICD-10-CM | POA: Diagnosis not present

## 2020-03-23 DIAGNOSIS — G893 Neoplasm related pain (acute) (chronic): Secondary | ICD-10-CM

## 2020-03-23 MED ORDER — HYDROMORPHONE HCL 1 MG/ML IJ SOLN
1.0000 mg | INTRAMUSCULAR | Status: AC
  Administered 2020-03-23: 1 mg via INTRAVENOUS
  Filled 2020-03-23: qty 1

## 2020-03-23 MED ORDER — HYDROMORPHONE HCL 1 MG/ML IJ SOLN
1.0000 mg | INTRAMUSCULAR | Status: DC | PRN
  Administered 2020-03-24 – 2020-03-27 (×3): 1 mg via INTRAVENOUS
  Filled 2020-03-23 (×3): qty 1

## 2020-03-23 NOTE — Consult Note (Addendum)
[facility] [place] Nurse ostomy follow up Stoma type/location: RLQ  colostomy Stomal assessment/size: 1 and 3/8 inch oval with lumen pointing downward at 6 o'clock. Budded, but in a natural crease. Requires soft convex pouches and skin barrier ring Peristomal assessment: intact Treatment options for stomal/peristomal skin: skin barrier ring and soft convex pouch Output: small amount of soft brown stool in pouch  Ostomy pouching: 1pc.soft convex with skin barrier ring Education provided: Patient is independent in emptying but will need reinforcement over the weekend.  Discussed with bedside RN, P. Armstrong, who is also an ostomy trreatment associate (OCA). Her assistance is appreciated. Patient is adamant that she is going home and I do not pursue as she repeats on several occasions that "I cannot afford rehab". She says "I will take care of myself".  She is unsure about all aspects of pouch change today, tracing pattern takes her nearly 30 minutes.  I cut pouch for her, she is able to peel off plastic backing, but not paper side "strips" as she did on Wednesday. She can close pouch using the Lock and Roll mechanism.  She will need assistance with every aspect of pouch changes twice weekly. Staff will continue to work with her on independence and proficiency in emptying pouch over weekend as that will need to be done several times daily. She sprays odor eliminator several times during session stating "I cannot stand that smell".  Enrolled patient in [facility] Discharge program: Yes, previously.  Supplies at bedside: 5 soft convex pouches, 5 skin barrier rings, odor spray.  [facility] nursing team will follow while in house, and will remain available to this patient, the nursing, surgical and medical teams.  Next scheduled visit will be on Monday, 03/26/20.  Thanks, Maudie Flakes, MSN, RN, [place], Arther Abbott  Pager# [phone]

## 2020-03-23 NOTE — Progress Notes (Signed)
Did Patient teaching with patient. Patient emptied her own colostomy bag and showed this RN the burping and emptying method. Patient did return demonstration.

## 2020-03-23 NOTE — Progress Notes (Signed)
PROGRESS NOTE    Vanessa Beard  ONG:[id number] DOB: 1950/12/04 DOA: 03/12/2020 PCP: Seward Carol, MD    Brief Narrative:  Vanessa Beard was admitted to the hospital with a working diagnosis of worsening anorectal squamous cell carcinoma. Now sp diverting colostomy  69 year old female with past medical history for hypertension, HIV, severe protein calorie malnutrition and anorectalsquamous cell carcinoma. Patient was readmitted 24 hours after discharge from the hospital. On that priorhospitalization patient was diagnosed with necrotic anal rectal mass, she declined palliative diverting colostomy. She underwent IR guided nerve block and was discharged with oral analgesics. At home her pain became intractable that prompted her to come back to the hospital. On her initial physical examination blood pressure 121/79, heart rate 62, temperature 98.2, respiratory rate 11, oxygen saturation 96%, her lungs were clear to auscultation bilaterally, abdomen was soft nontender, no lower extremity edema.  Patient was placed on analgesic regimen and surgery was consulted. She underwent palliative diverting colostomy 03/19/2020.  Palliative care services have been consulted, patient will be discharged  to rehab toattempt toregain functional status.  She had severe and persistent pain, required frequent adjustment of analgesics regimen.     Assessment & Plan:   Principal Problem:   Anal cancer ([place]) Active Problems:   Human immunodeficiency virus (HIV) disease ([place])   Chronic hepatitis C virus infection ([place])   Essential hypertension   Protein-calorie malnutrition, severe   Palliative care by specialist   General weakness   1. 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 SP laparoscopic ostomy placement on 11/01. Her abdominal continue to improved, now mainly has rectal pain. Positive bowel movement.   Fentanyl patch dose  has been increased to 75 mcg, yesterday she used 8 mg of PO hydromorphone.  On scheduledgabapentin 600 mg bid.   Continue adjustment of analgesics, target to use only oral agents. Patient has been declined by insurance for SNF, will call for a peer to peer, if no success, patient will go home with home health services.   She likely will need 48 H more of inpatient management to optimize pain regimen and continue to encourage mobility.   2. HIV. On antiretroviral therapy.  3. Depression.On duloxetine.   4. Severe calorie protein malnutrition.Continue with nutritional supplements.  5. Hypokalemia.patient is tolerating po well. K has been corrected.   6. GERD. on sucralfate.   Status is: Inpatient  Remains inpatient appropriate because:Inpatient level of care appropriate due to severity of illness   Dispo: The patient is from: Home              Anticipated d/c is to: Home              Anticipated d/c date is: 3 days              Patient currently is not medically stable to d/c.   DVT prophylaxis: Enoxaparin   Code Status:   dnr   Family Communication:  No family at the bedside      Nutrition Status: Nutrition Problem: Severe Malnutrition Etiology: chronic illness, cancer and cancer related treatments Signs/Symptoms: percent weight loss, severe fat depletion, severe muscle depletion Percent weight loss: 16 % (x 2.5 months) Interventions: Boost Plus, MVI, Magic cup    Consults: palliative care   Subjective: Patient is awake and alert, her pain is better controlled but not yet back to baseline. Her pain is more localized at the rectum.   Objective: Vitals:   03/22/20 1317  03/22/20 2205 03/23/20 0601 03/23/20 1245  BP: (!) 98/59 91/61 95/65  93/63  Pulse: 89 84 73 93  Resp: 16 16 16 17   Temp: 98.3 F (36.8 C) 99.2 F (37.3 C) 98.3 F (36.8 C) 99.5 F (37.5 C)  TempSrc: Oral Oral Oral Oral  SpO2: 98% 95% 97% (!) 88%  Weight:      Height:         Intake/Output Summary (Last 24 hours) at 03/23/2020 1336 Last data filed at 03/23/2020 1240 Gross per 24 hour  Intake 960 ml  Output 560 ml  Net 400 ml   Filed Weights   03/12/20 0550  Weight: 58.5 kg    Examination:   General: Not in pain or dyspnea deconditioned  Neurology: Awake and alert, non focal  E ENT: mild pallor, no icterus, oral mucosa moist Cardiovascular: No JVD. S1-S2 present, rhythmic, no gallops, rubs, or murmurs. No lower extremity edema. Pulmonary: positive breath sounds bilaterally,  Gastrointestinal. Abdomen soft and non tender/ ostomy in place.  Skin. No rashes Musculoskeletal: no joint deformities     Data Reviewed: I have personally reviewed following labs and imaging studies  CBC: Recent Labs  Lab 03/20/20 0803 03/21/20 0444 03/22/20 0451  WBC 6.4 5.7  --   NEUTROABS  --  4.6  --   HGB 8.1* 7.2* 7.2*  HCT 25.0* 23.3* 23.0*  MCV 98.0 102.6*  --   PLT 150 125*  --    Basic Metabolic Panel: Recent Labs  Lab 03/19/20 0456 03/20/20 0803 03/21/20 0444  NA  --  134* 136  K  --  4.9 4.6  CL  --  104 105  CO2  --  21* 26  GLUCOSE  --  117* 106*  BUN  --  16 16  CREATININE 0.61 0.71 0.65  CALCIUM  --  9.2 8.9   GFR: Estimated Creatinine Clearance: 51.6 mL/min (by C-G formula based on SCr of 0.65 mg/dL). Liver Function Tests: No results for input(s): AST, ALT, ALKPHOS, BILITOT, PROT, ALBUMIN in the last 168 hours. No results for input(s): LIPASE, AMYLASE in the last 168 hours. No results for input(s): AMMONIA in the last 168 hours. Coagulation Profile: No results for input(s): INR, PROTIME in the last 168 hours. Cardiac Enzymes: No results for input(s): CKTOTAL, CKMB, CKMBINDEX, TROPONINI in the last 168 hours. BNP (last 3 results) No results for input(s): PROBNP in the last 8760 hours. HbA1C: No results for input(s): HGBA1C in the last 72 hours. CBG: No results for input(s): GLUCAP in the last 168 hours. Lipid Profile: No  results for input(s): CHOL, HDL, LDLCALC, TRIG, CHOLHDL, LDLDIRECT in the last 72 hours. Thyroid Function Tests: No results for input(s): TSH, T4TOTAL, FREET4, T3FREE, THYROIDAB in the last 72 hours. Anemia Panel: No results for input(s): VITAMINB12, FOLATE, FERRITIN, TIBC, IRON, RETICCTPCT in the last 72 hours.    Radiology Studies: I have reviewed all of the imaging during this hospital visit personally     Scheduled Meds: . abacavir-dolutegravir-lamiVUDine  1 tablet Oral Daily  . Chlorhexidine Gluconate Cloth  6 each Topical Daily  . DULoxetine  20 mg Oral Daily  . enoxaparin (LOVENOX) injection  40 mg Subcutaneous Daily  . feeding supplement  237 mL Oral BID BM  . fentaNYL  1 patch Transdermal Q72H  . gabapentin  600 mg Oral BID  . lactose free nutrition  237 mL Oral TID WC  . liver oil-zinc oxide   Topical BID  . senna-docusate  1 tablet  Oral BID  . sucralfate  1 g Oral BID   Continuous Infusions:   LOS: 11 days        Takasha Vetere Gerome Apley, MD

## 2020-03-23 NOTE — Progress Notes (Signed)
HEMATOLOGY-ONCOLOGY PROGRESS NOTE  SUBJECTIVE: Patient lays in bed comfortably, she urinated in pad, states that she did not want to get up to urinate. Rectal pain is better.   Oncology History Overview Note  Cancer Staging Anal cancer ([place]) Staging form: Anus, AJCC 8th Edition - Clinical: Stage IV (cT2, cN0, cM1) - Signed by Truitt Merle, MD on 08/12/2019    Anal cancer ([place])  07/19/2019 Procedure   Colonoscopy by Dr Havery Moros 07/19/19  IMPRESSION - Ulcer noted at the anal canal in posterior midline canal that extends into the distal rectum on digital rectal exam, nodular and somewhat hard to palpation. Endoscopic images show nodular tissue in the area, concerning for malignant ulcer. - The examined portion of the ileum was normal. - One 3 mm polyp at the hepatic flexure, removed with a cold snare. Resected and retrieved. - One 5 mm polyp in the transverse colon, removed with a cold snare. Resected and retrieved. - Diverticulosis in the ascending colon. - The examination was otherwise normal.   07/19/2019 Initial Biopsy   Diagnosis 07/19/19 1. Transverse Colon Polyp, hepatic flexure (2) - TUBULAR ADENOMA (1 OF 3 FRAGMENTS) - BENIGN COLONIC MUCOSA (2 OF 3 FRAGMENTS) - NO HIGH GRADE DYSPLASIA OR MALIGNANCY IDENTIFIED 2. 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   Sub-cm hypermetabolic lymph nodes in posterior perirectal space, right inguinal region, right iliac chain, and gastrohepatic ligament, suspicious for metastatic disease.   Small hypermetabolic left axillary lymph nodes. This would be unusual location for metastatic anal  carcinoma. Recommend clinical correlation for possibility the patient has had recent COVID vaccination in the left arm, which could explain this finding.   Multifocal airspace disease in both lower lungs with marked hypermetabolic activity. This favors infectious or inflammatory etiology, and is not typical for pulmonary metastases. Short-term follow-up by chest CT is recommended.   08/15/2019 - 10/05/2019 Radiation Therapy   Concurrent ChemoRT with Dr. Lisbeth Renshaw starting 08/15/19-10/05/19   08/15/2019 - 09/12/2019 Chemotherapy   Concurrent ChemoRT with Mitomycin and 5FU on week 1 and 5 starting 08/15/19 with week 5 dose on 09/12/19.    11/02/2019 Imaging   CT CAP w contrast  IMPRESSION: No acute findings in the chest, abdomen or pelvis.   Biapical and bibasilar scarring. Bronchiectasis and paraseptal emphysema in the lung bases.   Tortuous aorta.   Small umbilical hernia containing fat.   01/05/2020 Imaging   PET SCAN IMPRESSION: 1. Partial metabolic response. Persistent anal mass hypermetabolism, decreased. No residual hypermetabolic nodal metastases in the abdomen or pelvis. Left axillary hypermetabolic lymph nodes are decreased in size and metabolism. No new or progressive hypermetabolic metastatic disease. 2. Hypermetabolism associated with patchy consolidative airspace opacities in the mid to lower lungs bilaterally, decreased in extent and metabolism, favoring resolving inflammatory opacities. 3. Aortic Atherosclerosis (ICD10-I70.0) and Emphysema (ICD10-J43.9).   01/27/2020 - 02/17/2020 Chemotherapy   The patient had pembrolizumab (KEYTRUDA) 200 mg in sodium chloride 0.9 % 50 mL chemo infusion, 200 mg, Intravenous, Once, 2 of 6 cycles Administration: 200 mg (01/27/2020), 200 mg (02/17/2020)  for chemotherapy treatment.    03/14/2020 -  Chemotherapy   The patient  had dexamethasone (DECADRON) 4 MG tablet, 8 mg, Oral, Daily, 0 of 1 cycle, Start date: --, End date: -- palonosetron  (ALOXI) injection 0.25 mg, 0.25 mg, Intravenous,  Once, 0 of 4 cycles leucovorin 624 mg in dextrose 5 % 250 mL infusion, 400 mg/m2 = 624 mg, Intravenous,  Once, 0 of 4 cycles oxaliplatin (ELOXATIN) 110 mg in dextrose 5 % 500 mL chemo infusion, 70 mg/m2 = 110 mg (100 % of original dose 70 mg/m2), Intravenous,  Once, 0 of 4 cycles Dose modification: 70 mg/m2 (original dose 70 mg/m2, Cycle 1, Reason: Provider Judgment) fluorouracil (ADRUCIL) 3,100 mg in sodium chloride 0.9 % 88 mL chemo infusion, 2,000 mg/m2 = 3,100 mg (100 % of original dose 2,000 mg/m2), Intravenous, 1 Day/Dose, 0 of 4 cycles Dose modification: 2,000 mg/m2 (original dose 2,000 mg/m2, Cycle 1, Reason: Provider Judgment)  for chemotherapy treatment.       I have reviewed the past medical history, past surgical history, social history and family history with the patient and they are unchanged from previous note.   PHYSICAL EXAMINATION: ECOG PERFORMANCE STATUS: 3 - Symptomatic, >50% confined to bed  Vitals:   03/23/20 0601 03/23/20 1245  BP: 95/65 93/63  Pulse: 73 93  Resp: 16 17  Temp: 98.3 F (36.8 C) 99.5 F (37.5 C)  SpO2: 97% (!) 88%   Filed Weights   03/12/20 0550  Weight: 129 lb (58.5 kg)    Intake/Output from previous day: 11/04 0701 - 11/05 0700 In: 22 [P.O.:720] Out: 650 [Urine:250; Stool:400]  GENERAL:alert, no distress and comfortable SKIN: skin color, texture, turgor are normal, no rashes or significant lesions NEURO: alert & oriented x 3 with fluent speech, no focal motor/sensory deficits  LABORATORY DATA:  I have reviewed the data as listed CMP Latest Ref Rng & Units 03/21/2020 03/20/2020 03/19/2020  Glucose 70 - 99 mg/dL 106(H) 117(H) -  BUN 8 - 23 mg/dL 16 16 -  Creatinine 0.44 - 1.00 mg/dL 0.65 0.71 0.61  Sodium 135 - 145 mmol/L 136 134(L) -  Potassium 3.5 - 5.1 mmol/L 4.6 4.9 -  Chloride 98 - 111 mmol/L 105 104 -  CO2 22 - 32 mmol/L 26 21(L) -  Calcium 8.9 - 10.3 mg/dL 8.9 9.2 -  Total  Protein 6.5 - 8.1 g/dL - - -  Total Bilirubin 0.3 - 1.2 mg/dL - - -  Alkaline Phos 38 - 126 U/L - - -  AST 15 - 41 U/L - - -  ALT 0 - 44 U/L - - -    Lab Results  Component Value Date   WBC 5.7 03/21/2020   HGB 7.2 (L) 03/22/2020   HCT 23.0 (L) 03/22/2020   MCV 102.6 (H) 03/21/2020   PLT 125 (L) 03/21/2020   NEUTROABS 4.6 03/21/2020    CT GUIDED NEEDLE PLACEMENT  Result Date: 03/06/2020 INDICATION: 69 year old female with anal cancer, non remaining pain, referred for pudendal nerve block EXAM: CT GUIDANCE NEEDLE PLACEMENT MEDICATIONS: None. ANESTHESIA/SEDATION: None FLUOROSCOPY TIME:  CT COMPLICATIONS: None PROCEDURE: Informed written consent was obtained from the patient after a thorough discussion of the procedural risks, benefits and alternatives. All questions were addressed. Maximal Sterile Barrier Technique was utilized including caps, mask, sterile gowns, sterile gloves, sterile drape, hand hygiene and skin antiseptic. A timeout was performed prior to the initiation of the procedure. Patient was position prone position on the CT gantry table. Scout CT was acquired for planning purposes. The patient was then prepped and draped in the usual  sterile fashion. 1% lidocaine was used for local anesthesia. Using CT guidance, bilateral transgluteal approach was performed using 22 gauge 15 cm Chiba needles, targeting the pudendal canal within the bilateral pelvis. Once we confirmed needle tip position with CT, small amount of 40% dilute contrast was injected to assure that the needle tip was not in a vascular space. We then injected a solution of 8 cc 5% bupivacaine and 1 mL (40 mg) Kenalog at both needle sites. Needles were removed and a final CT was performed. Patient tolerated the procedure well and remained hemodynamically stable throughout. No complications were encountered and no significant blood loss. IMPRESSION: Status post CT-guided needle placement at the bilateral pudendal canal for  bilateral pudendal nerve block Signed, Dulcy Fanny. Dellia Nims, RPVI [facility] Radiology Electronically Signed   By: Corrie Mckusick D.O.   On: 03/06/2020 16:31   CT GUIDED NEEDLE PLACEMENT  Result Date: 03/06/2020 INDICATION: 69 year old female with anal cancer, non remaining pain, referred for pudendal nerve block EXAM: CT GUIDANCE NEEDLE PLACEMENT MEDICATIONS: None. ANESTHESIA/SEDATION: None FLUOROSCOPY TIME:  CT COMPLICATIONS: None PROCEDURE: Informed written consent was obtained from the patient after a thorough discussion of the procedural risks, benefits and alternatives. All questions were addressed. Maximal Sterile Barrier Technique was utilized including caps, mask, sterile gowns, sterile gloves, sterile drape, hand hygiene and skin antiseptic. A timeout was performed prior to the initiation of the procedure. Patient was position prone position on the CT gantry table. Scout CT was acquired for planning purposes. The patient was then prepped and draped in the usual sterile fashion. 1% lidocaine was used for local anesthesia. Using CT guidance, bilateral transgluteal approach was performed using 22 gauge 15 cm Chiba needles, targeting the pudendal canal within the bilateral pelvis. Once we confirmed needle tip position with CT, small amount of 40% dilute contrast was injected to assure that the needle tip was not in a vascular space. We then injected a solution of 8 cc 5% bupivacaine and 1 mL (40 mg) Kenalog at both needle sites. Needles were removed and a final CT was performed. Patient tolerated the procedure well and remained hemodynamically stable throughout. No complications were encountered and no significant blood loss. IMPRESSION: Status post CT-guided needle placement at the bilateral pudendal canal for bilateral pudendal nerve block Signed, Dulcy Fanny. Dellia Nims, RPVI [facility] Radiology  Electronically Signed   By: Corrie Mckusick D.O.   On: 03/06/2020 16:31   CT ABDOMEN PELVIS W CONTRAST  Result Date: 03/14/2020 CLINICAL DATA:  Squamous cell carcinoma of the anus. EXAM: CT ABDOMEN AND PELVIS WITH CONTRAST TECHNIQUE: Multidetector CT imaging of the abdomen and pelvis was performed using the standard protocol following bolus administration of intravenous contrast. CONTRAST:  169mL OMNIPAQUE IOHEXOL 300 MG/ML  SOLN COMPARISON:  03/01/2020 FINDINGS: Lower chest: Bronchiectasis with parenchymal scarring noted in the lung bases bilaterally. Hepatobiliary: No suspicious focal abnormality within the liver parenchyma. Gallbladder not visualized in either markedly decompressed or surgically absent. No intrahepatic or extrahepatic biliary dilation. Pancreas: No focal mass lesion. No dilatation of the main duct. No intraparenchymal cyst. No peripancreatic edema. Spleen: No splenomegaly. No focal mass lesion. Adrenals/Urinary Tract: No adrenal nodule or mass. Stable 9 mm low-density lesion upper pole right kidney, incompletely characterized but likely a cyst. Early excretion of contrast material noted in calices of both kidneys. No renal stone disease visible on the exam from 12 days ago. No evidence for hydroureter. 2.6 x 1.3 cm  region of apparent focal posterior bladder wall thickening (image 60/series 2) is indeterminate as this finding was not visible on the scan from 12 days ago. Although today it is highly suspicious for urothelial neoplasm, given that it appears new in the interval, layering debris/blood products is the probable etiology. Nevertheless, close follow-up recommended. Stomach/Bowel: Stomach is unremarkable. No gastric wall thickening. No evidence of outlet obstruction. Duodenum is normally positioned as is the ligament of Treitz. No small bowel wall thickening. No small bowel dilatation. The terminal ileum is normal. The appendix is normal. Wall thickening in the low rectum/anus again  noted with heterogeneous enhancement, features compatible with the patient's known neoplasm. Vascular/Lymphatic: There is abdominal aortic atherosclerosis without aneurysm. There is no gastrohepatic or hepatoduodenal ligament lymphadenopathy. No retroperitoneal or mesenteric lymphadenopathy. No pelvic sidewall lymphadenopathy. Reproductive: Calcified fibroid noted in the uterus. There is no adnexal mass. Other: No intraperitoneal free fluid. Musculoskeletal: Soft tissue gas in the pelvic floor compatible with recent CT-guided bilateral pudendal nerve block. No worrisome lytic or sclerotic osseous abnormality. Multilevel compression deformity noted lower thoracic and lumbar spine. IMPRESSION: 1. 2.6 x 1.3 cm region of apparent focal posterior bladder wall thickening is indeterminate as this finding was not visible on the scan from 12 days ago. Although today, it has an appearance suspicious for urothelial neoplasm, given that it was not visible previously, layering debris/blood products is the probable etiology. Nevertheless, close follow-up recommended to ensure that it resolves. 2. No substantial change in the patient's known anal rectal mass. No evidence for metastatic disease in the abdomen or pelvis. 3. Soft tissue gas in the pelvic floor compatible with recent CT-guided bilateral pudendal nerve block. 4. Bronchiectasis with parenchymal scarring in the lung bases bilaterally. 5. Aortic Atherosclerosis (ICD10-I70.0). Electronically Signed   By: Misty Stanley M.D.   On: 03/14/2020 05:30   CT ABDOMEN PELVIS W CONTRAST  Result Date: 03/01/2020 CLINICAL DATA:  Colorectal cancer, new anal mass, difficulty voiding EXAM: CT ABDOMEN AND PELVIS WITH CONTRAST TECHNIQUE: Multidetector CT imaging of the abdomen and pelvis was performed using the standard protocol following bolus administration of intravenous contrast. CONTRAST:  180mL OMNIPAQUE IOHEXOL 300 MG/ML  SOLN COMPARISON:  11/02/2019 FINDINGS: Lower chest: No  acute abnormality. Fibrotic scarring of the included bilateral lung bases. Hepatobiliary: No focal liver abnormality is seen. Status post cholecystectomy. Postoperative biliary dilatation. Pancreas: Unremarkable. No pancreatic ductal dilatation or surrounding inflammatory changes. Spleen: Normal in size without significant abnormality. Adrenals/Urinary Tract: Adrenal glands are unremarkable. Kidneys are normal, without renal calculi, solid lesion, or hydronephrosis. Bladder is unremarkable. Stomach/Bowel: Stomach is within normal limits. Appendix appears normal. There is an irregular, rim enhancing soft tissue mass about the anus, containing air and fluid, measuring overall approximately 5.4 x 2.4 cm (series 2, image 76). No significant change in perirectal and presacral soft tissue stranding (series 2, image 67). Vascular/Lymphatic: Scattered aortic atherosclerosis. No enlarged abdominal or pelvic lymph nodes. Reproductive: Small calcified uterine fibroids. Other: No abdominal wall hernia or abnormality. No abdominopelvic ascites. Musculoskeletal: No acute or significant osseous findings. Unchanged superior endplate deformities of T12 and L4. IMPRESSION: 1. There is an irregular, rim enhancing soft tissue mass about the anus, containing air and fluid, measuring overall approximately 5.4 x 2.4 cm. Findings most suggestive of necrotic anorectal mass. Correlate with physical exam findings. 2. Unchanged fat stranding of the perirectal and presacral soft tissues, in keeping with prior radiation therapy. 3. No evidence of lymphadenopathy or metastatic disease in the abdomen or pelvis. 4. Aortic  Atherosclerosis (ICD10-I70.0). Electronically Signed   By: Eddie Candle M.D.   On: 03/01/2020 13:57   DG Chest Port 1 View  Result Date: 03/09/2020 CLINICAL DATA:  Status post Port-A-Cath placement. EXAM: PORTABLE CHEST 1 VIEW COMPARISON:  May 04, 2018. FINDINGS: Stable cardiomegaly. Interval placement of right internal  jugular Port-A-Cath with distal tip in expected position of the right brachiocephalic vein. No pneumothorax is noted. Mild bibasilar subsegmental atelectasis is noted. Bony thorax is unremarkable. IMPRESSION: Interval placement of right internal jugular Port-A-Cath with distal tip in expected position of the right brachiocephalic vein. Electronically Signed   By: Marijo Conception M.D.   On: 03/09/2020 09:25   DG C-Arm 1-60 Min-No Report  Result Date: 03/09/2020 Fluoroscopy was utilized by the requesting physician.  No radiographic interpretation.    ASSESSMENT AND PLAN: 1.  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  Anemia 3.  HIV 4.  History of hepatitis C 5.  Hypertension 6.  Hyperparathyroidism 7.  Osteoporosis  -she seems to be more comfortable, and I think she benefited from diverting colostomy.  -Per Dr. Kirstie Mirza note, pt is going to SNF. Per pt, she is not able to afford the cost of SNF, and is planning going home with hospice.  -appreciate hospitalist,surgery and palliative care teams' excellent care  -I will follow up as needed. I will continue to be her MD when she is under hospice care.   Truitt Merle  03/23/2020   Truitt Merle  03/23/2020

## 2020-03-23 NOTE — Progress Notes (Signed)
Daily Progress Note   Patient Name: Vanessa Beard       Date: 03/23/2020 DOB: 04/26/1951  Age: 69 y.o. MRN#: [id number] Attending Physician: Tawni Millers Primary Care Physician: Seward Carol, MD Admit Date: 03/12/2020  Reason for Consultation/Follow-up: Establishing goals of care  Subjective: I saw and examined Vanessa Beard today.  She way lying in bed in no distress.  Reviewed MAR and she has not required rescue meds overnight.  Reports that she did start to have more pain in last few minutes and requests dose of IV pain medication.  We discussed plan for dose of IV medication at this time, but will then focus on trying to utilize oral rescue meds to ensure they will be effective on discharge.  Also discussed again recommendation to pretreat 30 mins prior to activities that cause pain (such as PT).     Length of Stay: 11  Current Medications: Scheduled Meds:  . abacavir-dolutegravir-lamiVUDine  1 tablet Oral Daily  . Chlorhexidine Gluconate Cloth  6 each Topical Daily  . DULoxetine  20 mg Oral Daily  . enoxaparin (LOVENOX) injection  40 mg Subcutaneous Daily  . feeding supplement  237 mL Oral BID BM  . fentaNYL  1 patch Transdermal Q72H  . gabapentin  600 mg Oral BID  . lactose free nutrition  237 mL Oral TID WC  . liver oil-zinc oxide   Topical BID  . senna-docusate  1 tablet Oral BID  . sucralfate  1 g Oral BID    Continuous Infusions:   PRN Meds: acetaminophen, belladonna-opium, butamben-tetracaine-benzocaine, HYDROmorphone (DILAUDID) injection, HYDROmorphone, polyethylene glycol, simethicone, zolpidem  Physical Exam         No distress.  Awake and alert and interactive.   Regular work of breathing Abdomen not distended, deferred ostomy exam Non focal No  edema S1 S 2   Vital Signs: BP 93/63 (BP Location: Right Arm)   Pulse 93   Temp 99.5 F (37.5 C) (Oral)   Resp 17   Ht 5' (1.524 m)   Wt 58.5 kg   SpO2 (!) 88%   BMI 25.19 kg/m  SpO2: SpO2: (!) 88 % O2 Device: O2 Device: Room Air O2 Flow Rate: O2 Flow Rate (L/min): 2 L/min  Intake/output summary:   Intake/Output Summary (Last 24 hours) at 03/23/2020 1537 Last  data filed at 03/23/2020 1240 Gross per 24 hour  Intake 960 ml  Output 560 ml  Net 400 ml   LBM: Last BM Date: 03/23/20 Baseline Weight: Weight: 58.5 kg Most recent weight: Weight: 58.5 kg      PPS 50% Palliative Assessment/Data:    Flowsheet Rows     Most Recent Value  Intake Tab  Referral Department Hospitalist  Unit at Time of Referral Oncology Unit  Palliative Care Primary Diagnosis Cancer  Date Notified 03/13/20  Palliative Care Type Return patient Palliative Care  Reason for referral Pain  Date of Admission 03/12/20  Date first seen by Palliative Care 03/13/20  # of days Palliative referral response time 0 Day(s)  # of days IP prior to Palliative referral 1  Clinical Assessment  Psychosocial & Spiritual Assessment  Palliative Care Outcomes      Patient Active Problem List   Diagnosis Date Noted  . Palliative care by specialist   . General weakness   . Protein-calorie malnutrition, severe 03/05/2020  . Palliative care encounter 01/27/2020  . Goals of care, counseling/discussion 01/16/2020  . Hemorrhoid 12/16/2019  . Rectal pain 12/16/2019  . Anal cancer ([place]) 07/26/2019  . Rectal mass 07/14/2019  . Constipation 07/14/2019  . Anal fissure 07/14/2019  . Healthcare maintenance 06/30/2018  . Left knee pain 12/30/2017  . Left shoulder pain 12/30/2017  . Closed fracture of right tibial plateau 02/18/2017  . Benign hypertension 02/15/2017  . Screening examination for venereal disease 03/06/2015  . Encounter for long-term (current) use of medications 03/06/2015  . Elevated LFTs 12/11/2014  .  Abnormal ultrasound 12/11/2014  . Abdominal pain, epigastric 12/11/2014  . ASCUS of cervix with negative high risk HPV 10/31/2013  . Gall bladder disease 02/03/2013  . 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  . Compression fracture of L3 lumbar vertebra 07/01/2011  . Thyroid dysfunction 07/01/2011  . Anemia 07/01/2011  . Sore throat 04/02/2011  . Hyperparathyroidism 02/10/2011  . Osteopenia 02/03/2011  . PAP SMEAR, LGSIL, ABNORMAL 12/21/2009  . COUGH 07/18/2009  . Chronic hepatitis C virus infection ([place]) 12/19/2008  . LEUKORRHEA 04/07/2008  . FATIGUE 04/07/2008  . BACK PAIN, LUMBAR 11/10/2007  . ALLERGIC RHINITIS 07/09/2007  . PNEUMOCYSTIS PNEUMONIA 05/07/2007  . Essential hypertension 05/07/2007  . DENTAL CARIES 05/07/2007  . INSOMNIA 05/07/2007  . PNEUMONIA, HX OF 05/07/2007  . HERPES ZOSTER, HX OF 05/07/2007  . Human immunodeficiency virus (HIV) disease ([place]) 08/04/2006    Palliative Care Assessment & Plan   Patient Profile:    Assessment:  squamous cell ca anus Severe pain Necrotic anorectal mass Has history of HIV HTN  Recommendations/Plan:  Status post diverting ostomy .  Patient agrees she would be best served to pursue placement at [facility] for short term rehab.  TOC aware and appreciate assistance.    Pain management: Pain controlled today per patient report.  Continue fentanyl 75 mcg/h.  Discussed plan to make oral medication for breakthrough pain her first line pain medication to ensure that it is going to be effective to control her pain on discharge.  Also reviewed recommendation to pretreat with rescue medication 30 minutes prior to activities that cause pain.  I did leave a backup dose of 1mg  of IV dilaudid to be given 40 minutes after her oral rescue medication if the oral medication is ineffective to relieve her pain.  Code Status:    Code Status Orders  (From admission, onward)         Start  Ordered   03/12/20 0940  Do not  attempt resuscitation (DNR)  Continuous       Question Answer Comment  In the event of cardiac or respiratory ARREST Do not call a "code blue"   In the event of cardiac or respiratory ARREST Do not perform Intubation, CPR, defibrillation or ACLS   In the event of cardiac or respiratory ARREST Use medication by any route, position, wound care, and other measures to relive pain and suffering. May use oxygen, suction and manual treatment of airway obstruction as needed for comfort.      03/12/20 0939        Code Status History    Date Active Date Inactive Code Status Order ID Comments User Context   03/03/2020 1955 03/11/2020 2230 DNR [id number]  Lenore Cordia, MD ED   02/15/2017 2327 02/21/2017 1728 Full Code [id number]  Jani Gravel, MD Inpatient   Advance Care Planning Activity      Discharge Planning:  [place] for rehab with Palliative care service follow-up  Care plan was discussed with  Patient    Thank you for allowing the Palliative Medicine Team to assist in the care of this patient.   Time In: 1120 Time Out: 1145 Total Time 25 Prolonged Time Billed  no    Greater than 50%  of this time was spent counseling and coordinating care related to the above assessment and plan.  Micheline Rough, MD  Please contact Palliative Medicine Team phone at [phone] for questions and concerns.

## 2020-03-23 NOTE — Progress Notes (Signed)
4 Days Post-Op    CC: Rectal pain  Subjective: Patient is doing well this AM.  Tolerating diet.  Ostomy is working well.  Port sites all look good.  Objective: Vital signs in last 24 hours: Temp:  [98.3 F (36.8 C)-99.2 F (37.3 C)] 98.3 F (36.8 C) (11/05 0601) Pulse Rate:  [73-89] 73 (11/05 0601) Resp:  [16] 16 (11/05 0601) BP: (91-98)/(59-65) 95/65 (11/05 0601) SpO2:  [95 %-98 %] 97 % (11/05 0601) Last BM Date: 03/22/20 720 PO Urine 250 Stool 400 Afebrile, VSS H/H 7.2/23  Intake/Output from previous day: 11/04 0701 - 11/05 0700 In: 720 [P.O.:720] Out: 650 [Urine:250; Stool:400] Intake/Output this shift: No intake/output data recorded.  General appearance: alert, cooperative and no distress Resp: clear to auscultation bilaterally GI: soft, sore, ostomy looks good, working well.  Abdomen is soft and not distended  Lab Results:  Recent Labs    03/20/20 0803 03/20/20 0803 03/21/20 0444 03/22/20 0451  WBC 6.4  --  5.7  --   HGB 8.1*   < > 7.2* 7.2*  HCT 25.0*   < > 23.3* 23.0*  PLT 150  --  125*  --    < > = values in this interval not displayed.    BMET Recent Labs    03/20/20 0803 03/21/20 0444  NA 134* 136  K 4.9 4.6  CL 104 105  CO2 21* 26  GLUCOSE 117* 106*  BUN 16 16  CREATININE 0.71 0.65  CALCIUM 9.2 8.9   PT/INR No results for input(s): LABPROT, INR in the last 72 hours.  No results for input(s): AST, ALT, ALKPHOS, BILITOT, PROT, ALBUMIN in the last 168 hours.   Lipase     Component Value Date/Time   LIPASE 25 11/02/2019 0030     Medications: . abacavir-dolutegravir-lamiVUDine  1 tablet Oral Daily  . Chlorhexidine Gluconate Cloth  6 each Topical Daily  . DULoxetine  20 mg Oral Daily  . enoxaparin (LOVENOX) injection  40 mg Subcutaneous Daily  . feeding supplement  237 mL Oral BID BM  . fentaNYL  1 patch Transdermal Q72H  . gabapentin  600 mg Oral BID  . lactose free nutrition  237 mL Oral TID WC  . liver oil-zinc oxide    Topical BID  . senna-docusate  1 tablet Oral BID  . 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 Marcello Moores - 03/19/2020 - POD #4 - WOCN following for colostomy teaching  - Mobilize- PT recommending SNF -Tolerating regular diet and having bowel function - Pulm toilet.  FEN -Reg diet. Ensure VTE -SCDs,Lovenox ID -cefotetan periop Foley - d/c'd 11/2. Plan - Continue colostomy teaching. Patient is doing well, having bowel function and tolerating a diet. Okay for d/c to SNF from our standpoint. Follow up is on the chart in AVS.      LOS: 11 days    Vanessa Beard 03/23/2020 Please see Amion

## 2020-03-24 NOTE — Progress Notes (Signed)
[facility])  Request received from PMT to meet with patient to explain hospice services.  Met with pt at the bedside. She states she is feeling better and looking forward to going home.  Explained hospice services and what all is included. She is concerned with cost related to any discharge plans. Advised her that her medicare benefit will pay for much of hospice services. The exception being her triumeq, which is not on our formulary. She advised that she could pay OOP for this medication. I encouraged her to have conversations with her ID team to request samples if possible.  Ms. Cahoon speaks of wanting to get better to return to work. Not clear if she has good insight into her disease process.  Attempted to illicit what Dr. Burr Medico has shared with her about treatment options. She states that she wants to speak to her again to see if there are any other treatment options available to her.   Her dtr is currently out of the country and will return on Wednesday. Should she be ready to d/c prior to Wednesday, she states she has good support to do so.  Advised her should she decide to focus on comfort, we would be happy to provide that service. She seemed more interested in hospice as she did not want to pay OOP for SNF services.  She may be interested in SNF services if there is little to no OOP cost for her.  [facility] will continue to follow and assist with d/c planning, should the decision be made for full comfort and hospice.  Venia Carbon RN, BSN, [facility] Liaison

## 2020-03-24 NOTE — Progress Notes (Signed)
PROGRESS NOTE    Vanessa Beard  ZOX:[id number] DOB: 02/03/1951 DOA: 03/12/2020 PCP: Seward Carol, MD    Brief Narrative:  Vanessa Beard was admitted to the hospital with a working diagnosis of worsening anorectal squamous cell carcinoma. Now sp diverting colostomy  69 year old female with past medical history for hypertension, HIV, severe protein calorie malnutrition and anorectalsquamous cell carcinoma. Patient was readmitted 24 hours after discharge from the hospital. On that priorhospitalization patient was diagnosed with necrotic anal rectal mass, she declined palliative diverting colostomy. She underwent IR guided nerve block and was discharged with oral analgesics. At home her pain became intractable that prompted her to come back to the hospital. On her initial physical examination blood pressure 121/79, heart rate 62, temperature 98.2, respiratory rate 11, oxygen saturation 96%, her lungswere clear to auscultation bilaterally, abdomen was soft nontender, no lower extremity edema.  Patientwas placed onanalgesicregimen and surgery was consulted. She underwent palliative diverting colostomy 03/19/2020.  Palliative care services have been consulted, patient will be discharged to rehab toattempt toregain functional status.  She had severe and persistent pain, required frequent adjustment of analgesics regimen.  Improving pain control, her insurance has declined SNF, patient has decided to go home with home hospice when more stable.   Assessment & Plan:   Principal Problem:   Anal cancer (Vanessa Beard) Active Problems:   Human immunodeficiency virus (HIV) disease ([place])   Chronic hepatitis C virus infection ([place])   Essential hypertension   Protein-calorie malnutrition, severe   Palliative care by specialist   General weakness   Cancer associated pain   1. Anorectal squamous cell carcinoma/ mass, chronicanemia due to malignancy/acutepostoperativeanemia. SP  laparoscopic ostomy placement on 11/01. Abdominal pain has improved with current regimen, she has no nausea or vomiting.   Continue with fentanyl patch 75 mcg, and as needed hydromorphone. Continue with scheduledgabapentin 600 mg bid. On bowel regimen with good toleration.   Plan patient to go home with home hospice in 48 H, if pain continue to be controlled. I will call her family for update.    2. HIV. Continue with antiretroviral therapy, will need to coordinate continue therapy as outpatient. Antiviral not on hospice formulary  3. Depression.Continue withduloxetine.   4. Severe calorie protein malnutrition.On nutritional supplements.  5. Hypokalemia.No further nausea or vomiting.   6. GERD.Continue with sucralfate.    Status is: Inpatient  Remains inpatient appropriate because:Inpatient level of care appropriate due to severity of illness   Dispo: The patient is from: Home              Anticipated d/c is to: Home              Anticipated d/c date is: 2 days              Patient currently is not medically stable to d/c. Plan for dc home with home hospice in 48 H if pain continue to be well controlled.   DVT prophylaxis:  enoxaparin   Code Status:   dnr   Family Communication:  Unable to reach her friend [name] or her daughter over the phone.     Nutrition Status: Nutrition Problem: Severe Malnutrition Etiology: chronic illness, cancer and cancer related treatments Signs/Symptoms: percent weight loss, severe fat depletion, severe muscle depletion Percent weight loss: 16 % (x 2.5 months) Interventions: Boost Plus, MVI, Magic cup      Consultants:   Oncology   Palliative Care    Subjective: Patient is awake and alert, no nausea or  vomiting, her abdominal pain is controlled with analgesics.   Objective: Vitals:   03/23/20 0601 03/23/20 1245 03/23/20 2045 03/24/20 0528  BP: 95/65 93/63 105/72 109/65  Pulse: 73 93 96 67  Resp: 16 17 16 18    Temp: 98.3 F (36.8 C) 99.5 F (37.5 C) 100.2 F (37.9 C) 97.8 F (36.6 C)  TempSrc: Oral Oral Oral Oral  SpO2: 97% (!) 88% 95% 97%  Weight:      Height:        Intake/Output Summary (Last 24 hours) at 03/24/2020 1211 Last data filed at 03/24/2020 0900 Gross per 24 hour  Intake 340 ml  Output 560 ml  Net -220 ml   Filed Weights   03/12/20 0550  Weight: 58.5 kg    Examination:   General: Not in pain or dyspnea, deconditioned  Neurology: Awake and alert, non focal  E ENT: mild pallor, no icterus, oral mucosa moist Cardiovascular: No JVD. S1-S2 present, rhythmic, no gallops, rubs, or murmurs. No lower extremity edema. Pulmonary: positive breath sounds bilaterally, with no wheezing, rhonchi or rales. Gastrointestinal. Abdomen soft and non tender Skin. No rashes Musculoskeletal: no joint deformities     Data Reviewed: I have personally reviewed following labs and imaging studies  CBC: Recent Labs  Lab 03/20/20 0803 03/21/20 0444 03/22/20 0451  WBC 6.4 5.7  --   NEUTROABS  --  4.6  --   HGB 8.1* 7.2* 7.2*  HCT 25.0* 23.3* 23.0*  MCV 98.0 102.6*  --   PLT 150 125*  --    Basic Metabolic Panel: Recent Labs  Lab 03/19/20 0456 03/20/20 0803 03/21/20 0444  NA  --  134* 136  K  --  4.9 4.6  CL  --  104 105  CO2  --  21* 26  GLUCOSE  --  117* 106*  BUN  --  16 16  CREATININE 0.61 0.71 0.65  CALCIUM  --  9.2 8.9   GFR: Estimated Creatinine Clearance: 51.6 mL/min (by C-G formula based on SCr of 0.65 mg/dL). Liver Function Tests: No results for input(s): AST, ALT, ALKPHOS, BILITOT, PROT, ALBUMIN in the last 168 hours. No results for input(s): LIPASE, AMYLASE in the last 168 hours. No results for input(s): AMMONIA in the last 168 hours. Coagulation Profile: No results for input(s): INR, PROTIME in the last 168 hours. Cardiac Enzymes: No results for input(s): CKTOTAL, CKMB, CKMBINDEX, TROPONINI in the last 168 hours. BNP (last 3 results) No results for  input(s): PROBNP in the last 8760 hours. HbA1C: No results for input(s): HGBA1C in the last 72 hours. CBG: No results for input(s): GLUCAP in the last 168 hours. Lipid Profile: No results for input(s): CHOL, HDL, LDLCALC, TRIG, CHOLHDL, LDLDIRECT in the last 72 hours. Thyroid Function Tests: No results for input(s): TSH, T4TOTAL, FREET4, T3FREE, THYROIDAB in the last 72 hours. Anemia Panel: No results for input(s): VITAMINB12, FOLATE, FERRITIN, TIBC, IRON, RETICCTPCT in the last 72 hours.    Radiology Studies: I have reviewed all of the imaging during this hospital visit personally     Scheduled Meds: . abacavir-dolutegravir-lamiVUDine  1 tablet Oral Daily  . Chlorhexidine Gluconate Cloth  6 each Topical Daily  . DULoxetine  20 mg Oral Daily  . enoxaparin (LOVENOX) injection  40 mg Subcutaneous Daily  . feeding supplement  237 mL Oral BID BM  . fentaNYL  1 patch Transdermal Q72H  . gabapentin  600 mg Oral BID  . lactose free nutrition  237 mL Oral TID  WC  . liver oil-zinc oxide   Topical BID  . senna-docusate  1 tablet Oral BID  . sucralfate  1 g Oral BID   Continuous Infusions:   LOS: 12 days        Shakeia Krus Gerome Apley, MD

## 2020-03-25 MED ORDER — ACETAMINOPHEN 500 MG PO TABS
1000.0000 mg | ORAL_TABLET | Freq: Three times a day (TID) | ORAL | Status: DC
  Administered 2020-03-25 – 2020-03-27 (×7): 1000 mg via ORAL
  Filled 2020-03-25 (×7): qty 2

## 2020-03-25 NOTE — Progress Notes (Signed)
Daily Progress Note   Patient Name: Vanessa Beard       Date: 03/25/2020 DOB: 1950/06/30  Age: 69 y.o. MRN#: [id number] Attending Physician: Tawni Millers Primary Care Physician: Seward Carol, MD Admit Date: 03/12/2020  Reason for Consultation/Follow-up: Establishing goals of care  Subjective: I saw and examined Vanessa Beard today.  She way lying in bed in no distress.  She discussed again with Dr. Burr Medico yesterday and they talked about working to transition home rather than going to skilled facility for trial of rehab.  Vanessa Beard reports she is concerned about finances and thinks that home hospice may be a better option.  I discussed with her that hospice is certainly a excellent support at home but they are not in the home 24 hours.  I told her that I would certainly support a decision to transition home with hospice if she has adequate help in the home, particularly until her daughter arrives back in the country sometime early next week.  She reports that her friend, [name], would be willing to do anything necessary to help her at home if needed until her daughter returns.     Length of Stay: 13  Current Medications: Scheduled Meds:  . abacavir-dolutegravir-lamiVUDine  1 tablet Oral Daily  . Chlorhexidine Gluconate Cloth  6 each Topical Daily  . DULoxetine  20 mg Oral Daily  . enoxaparin (LOVENOX) injection  40 mg Subcutaneous Daily  . feeding supplement  237 mL Oral BID BM  . fentaNYL  1 patch Transdermal Q72H  . gabapentin  600 mg Oral BID  . lactose free nutrition  237 mL Oral TID WC  . liver oil-zinc oxide   Topical BID  . senna-docusate  1 tablet Oral BID  . sucralfate  1 g Oral BID    Continuous Infusions:   PRN Meds: acetaminophen, belladonna-opium,  butamben-tetracaine-benzocaine, HYDROmorphone (DILAUDID) injection, HYDROmorphone, polyethylene glycol, simethicone, zolpidem  Physical Exam         No distress.  Awake and alert and interactive.   Regular work of breathing Abdomen not distended, ostomy examined and functioing with liquid stool and little gas Non focal No edema S1 S 2   Vital Signs: BP 102/73 (BP Location: Right Arm)   Pulse 73   Temp 98 F (36.7 C) (Oral)   Resp 16  Ht 5' (1.524 m)   Wt 58.5 kg   SpO2 100%   BMI 25.19 kg/m  SpO2: SpO2: 100 % O2 Device: O2 Device: Room Air O2 Flow Rate: O2 Flow Rate (L/min): 2 L/min  Intake/output summary:   Intake/Output Summary (Last 24 hours) at 03/25/2020 0741 Last data filed at 03/24/2020 2015 Gross per 24 hour  Intake 100 ml  Output 0 ml  Net 100 ml   LBM: Last BM Date: 03/24/20 Baseline Weight: Weight: 58.5 kg Most recent weight: Weight: 58.5 kg      PPS 50% Palliative Assessment/Data:    Flowsheet Rows     Most Recent Value  Intake Tab  Referral Department Hospitalist  Unit at Time of Referral Oncology Unit  Palliative Care Primary Diagnosis Cancer  Date Notified 03/13/20  Palliative Care Type Return patient Palliative Care  Reason for referral Pain  Date of Admission 03/12/20  Date first seen by Palliative Care 03/13/20  # of days Palliative referral response time 0 Day(s)  # of days IP prior to Palliative referral 1  Clinical Assessment  Psychosocial & Spiritual Assessment  Palliative Care Outcomes      Patient Active Problem List   Diagnosis Date Noted  . Cancer associated pain   . Palliative care by specialist   . General weakness   . Protein-calorie malnutrition, severe 03/05/2020  . Palliative care encounter 01/27/2020  . Goals of care, counseling/discussion 01/16/2020  . Hemorrhoid 12/16/2019  . Rectal pain 12/16/2019  . Anal cancer ([place]) 07/26/2019  . Rectal mass 07/14/2019  . Constipation 07/14/2019  . Anal fissure 07/14/2019   . Healthcare maintenance 06/30/2018  . Left knee pain 12/30/2017  . Left shoulder pain 12/30/2017  . Closed fracture of right tibial plateau 02/18/2017  . Benign hypertension 02/15/2017  . Screening examination for venereal disease 03/06/2015  . Encounter for long-term (current) use of medications 03/06/2015  . Elevated LFTs 12/11/2014  . Abnormal ultrasound 12/11/2014  . Abdominal pain, epigastric 12/11/2014  . ASCUS of cervix with negative high risk HPV 10/31/2013  . Gall bladder disease 02/03/2013  . 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  . Compression fracture of L3 lumbar vertebra 07/01/2011  . Thyroid dysfunction 07/01/2011  . Anemia 07/01/2011  . Sore throat 04/02/2011  . Hyperparathyroidism 02/10/2011  . Osteopenia 02/03/2011  . PAP SMEAR, LGSIL, ABNORMAL 12/21/2009  . COUGH 07/18/2009  . Chronic hepatitis C virus infection ([place]) 12/19/2008  . LEUKORRHEA 04/07/2008  . FATIGUE 04/07/2008  . BACK PAIN, LUMBAR 11/10/2007  . ALLERGIC RHINITIS 07/09/2007  . PNEUMOCYSTIS PNEUMONIA 05/07/2007  . Essential hypertension 05/07/2007  . DENTAL CARIES 05/07/2007  . INSOMNIA 05/07/2007  . PNEUMONIA, HX OF 05/07/2007  . HERPES ZOSTER, HX OF 05/07/2007  . Human immunodeficiency virus (HIV) disease ([place]) 08/04/2006    Palliative Care Assessment & Plan   Patient Profile:    Assessment:  squamous cell ca anus Severe pain Necrotic anorectal mass Has history of HIV HTN  Recommendations/Plan:  Goals of care: I met with Vanessa Beard today.  She was awake and alert and in a good mood.  We discussed her conversation with Dr. Burr Medico yesterday where they had talked about transition home with hospice rather than pursuing SNF placement.  Vanessa Beard states today that she thinks she would like to work to go home with hospice support.  Her daughter is currently out of town, but she has a Industrial/product designer, [name], who she states could help her until her daughter arrives back in  the  country.  Discussed hospice providing care with a focus on comfort and aggressive symptom management at home.  In reviewing her medications, I am concerned about her antiviral and if this will be covered by hospice formulary.  I reached out to liaison from [facility] who is going to meet with Ms. Pitkin to review her case and discuss options.  When asked about the things that 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  I tried to gently process with her that this is not going to be a realistic expectation in light of her advanced the disease and very limited prognosis.  I have consistently thought that she had fairly good insight into her overall condition, but there is concern based on this statement if she really understands the severity of her condition.  Pain management: Pain controlled today per patient report.  Continue fentanyl 75 mcg/h.  Discussed plan to make oral medication for breakthrough pain her first line pain medication to ensure that it is going to be effective to control her pain on discharge.  Also reviewed recommendation to pretreat with rescue medication 30 minutes prior to activities that cause pain.  I did leave a backup dose of 5m of IV dilaudid to be given 40 minutes after her oral rescue medication if the oral medication is ineffective to relieve her pain.    Code Status:    Code Status Orders  (From admission, onward)         Start     Ordered   03/12/20 0940  Do not attempt resuscitation (DNR)  Continuous       Question Answer Comment  In the event of cardiac or respiratory ARREST Do not call a "code blue"   In the event of cardiac or respiratory ARREST Do not perform Intubation, CPR, defibrillation or ACLS   In the event of cardiac or respiratory ARREST Use medication by any route, position, wound care, and other measures to relive pain and suffering. May use oxygen, suction and manual treatment of  airway obstruction as needed for comfort.      03/12/20 0939        Code Status History    Date Active Date Inactive Code Status Order ID Comments User Context   03/03/2020 1955 03/11/2020 2230 DNR [id number] PLenore Cordia MD ED   02/15/2017 2327 02/21/2017 1728 Full Code [id number] KJani Gravel MD Inpatient   Advance Care Planning Activity      Discharge Planning:  Home with Hospice?  Care plan was discussed with  Patient    Thank you for allowing the Palliative Medicine Team to assist in the care of this patient.   Time In: 1000 Time Out: 1025 Total Time 25 Prolonged Time Billed  no    Greater than 50%  of this time was spent counseling and coordinating care related to the above assessment and plan.   GMicheline Rough MD  Please contact Palliative Medicine Team phone at [phone] questions and concerns.

## 2020-03-25 NOTE — Progress Notes (Signed)
PROGRESS NOTE    Vanessa Beard  YSA:[id number] DOB: Oct 30, 1950 DOA: 03/12/2020 PCP: Seward Carol, MD    Brief Narrative:  Vanessa Beard was admitted to the hospital with a working diagnosis of worsening anorectal squamous cell carcinoma. Now sp diverting colostomy  69 year old female with past medical history for hypertension, HIV, severe protein calorie malnutrition and anorectalsquamous cell carcinoma. Patient was readmitted 24 hours after discharge from the hospital. On that priorhospitalization patient was diagnosed with necrotic anal rectal mass, she declined palliative diverting colostomy. She underwent IR guided nerve block and was discharged with oral analgesics. At home her pain became intractable that prompted her to come back to the hospital. On her initial physical examination blood pressure 121/79, heart rate 62, temperature 98.2, respiratory rate 11, oxygen saturation 96%, her lungswere clear to auscultation bilaterally, abdomen was soft nontender, no lower extremity edema.  Patientwas placed onanalgesicregimen and surgery was consulted. She underwent palliative diverting colostomy 03/19/2020.  Palliative care services have been consulted, patient will be discharged to rehab toattempt toregain functional status.  She had severe and persistent pain, required frequent adjustment of analgesics regimen.  Improving pain control, her insurance has declined SNF, patient has decided to go home with home hospice when more stable.     Assessment & Plan:   Principal Problem:   Anal cancer ([place]) Active Problems:   Human immunodeficiency virus (HIV) disease ([place])   Chronic hepatitis C virus infection ([place])   Essential hypertension   Protein-calorie malnutrition, severe   Palliative care by specialist   General weakness   Cancer associated pain   1. Anorectal squamous cell carcinoma/ rectal mass, chronicanemia due to  malignancy/acutepostoperativeanemia. SP laparoscopic ostomy placement on 11/01.  Tolerating po well, her pain is better controlled but continue to have episodic exacerbations.   Tolerating well fentanyl patch66mcg, hydromorphone PRN po 4 mg (#2 doses yesterday) / IV 1 mg (#1 dose yesterday) On gabapentin 600 mg bid and will change acetaminophen to scheduled tid 1000 mg Continue with bowel regimen.   Plan for home with hospice in the next 24 H.   2. HIV.Tolerating wellantiretroviral therapy, will contact the ID outpatient clinic to coordinate continue medical therapy, since this medication is not covered by hospice.   3. Depression.onduloxetine.   4. Severe calorie protein malnutrition.continue withnutritional supplements.  5. Hypokalemia.No further nausea or vomiting.   6. GERD.onsucralfate.   Status is: Inpatient  Remains inpatient appropriate because:Inpatient level of care appropriate due to severity of illness   Dispo: The patient is from: Home              Anticipated d/c is to: Home              Anticipated d/c date is: 1 day              Patient currently is not medically stable to d/c. Plan for possible dc home with home hospice in am.     DVT prophylaxis: Enoxaparin   Code Status:   dnr   Family Communication:  Unable to reach his family or contacts over the phone.     Nutrition Status: Nutrition Problem: Severe Malnutrition Etiology: chronic illness, cancer and cancer related treatments Signs/Symptoms: percent weight loss, severe fat depletion, severe muscle depletion Percent weight loss: 16 % (x 2.5 months) Interventions: Boost Plus, MVI, Magic cup     Consultants:   Palliative care  Surgery   Procedures:   Diverting colostomy     Subjective: Patient reports no nausea  or vomiting, positive pain at the rectal region, controlled with analgesics with episodic exacerbations.   Objective: Vitals:   03/24/20 0528 03/24/20  1318 03/24/20 2202 03/25/20 0600  BP: 109/65 98/76 96/70  102/73  Pulse: 67 80 94 73  Resp: 18 16 16 16   Temp: 97.8 F (36.6 C) 98.4 F (36.9 C) 99.1 F (37.3 C) 98 F (36.7 C)  TempSrc: Oral  Oral Oral  SpO2: 97% 100% 95% 100%  Weight:      Height:        Intake/Output Summary (Last 24 hours) at 03/25/2020 0932 Last data filed at 03/24/2020 2015 Gross per 24 hour  Intake --  Output 0 ml  Net 0 ml   Filed Weights   03/12/20 0550  Weight: 58.5 kg    Examination:   General: Not in pain or dyspnea,  Neurology: Awake and alert, non focal  E ENT: mild pallor, no icterus, oral mucosa moist Cardiovascular: No JVD. S1-S2 present, rhythmic, no gallops, rubs, or murmurs. No lower extremity edema. Pulmonary: positive breath sounds bilaterally, Gastrointestinal. Abdomen soft and non tender Skin. No rashes Musculoskeletal: no joint deformities     Data Reviewed: I have personally reviewed following labs and imaging studies  CBC: Recent Labs  Lab 03/20/20 0803 03/21/20 0444 03/22/20 0451  WBC 6.4 5.7  --   NEUTROABS  --  4.6  --   HGB 8.1* 7.2* 7.2*  HCT 25.0* 23.3* 23.0*  MCV 98.0 102.6*  --   PLT 150 125*  --    Basic Metabolic Panel: Recent Labs  Lab 03/19/20 0456 03/20/20 0803 03/21/20 0444  NA  --  134* 136  K  --  4.9 4.6  CL  --  104 105  CO2  --  21* 26  GLUCOSE  --  117* 106*  BUN  --  16 16  CREATININE 0.61 0.71 0.65  CALCIUM  --  9.2 8.9   GFR: Estimated Creatinine Clearance: 51.6 mL/min (by C-G formula based on SCr of 0.65 mg/dL). Liver Function Tests: No results for input(s): AST, ALT, ALKPHOS, BILITOT, PROT, ALBUMIN in the last 168 hours. No results for input(s): LIPASE, AMYLASE in the last 168 hours. No results for input(s): AMMONIA in the last 168 hours. Coagulation Profile: No results for input(s): INR, PROTIME in the last 168 hours. Cardiac Enzymes: No results for input(s): CKTOTAL, CKMB, CKMBINDEX, TROPONINI in the last 168 hours. BNP  (last 3 results) No results for input(s): PROBNP in the last 8760 hours. HbA1C: No results for input(s): HGBA1C in the last 72 hours. CBG: No results for input(s): GLUCAP in the last 168 hours. Lipid Profile: No results for input(s): CHOL, HDL, LDLCALC, TRIG, CHOLHDL, LDLDIRECT in the last 72 hours. Thyroid Function Tests: No results for input(s): TSH, T4TOTAL, FREET4, T3FREE, THYROIDAB in the last 72 hours. Anemia Panel: No results for input(s): VITAMINB12, FOLATE, FERRITIN, TIBC, IRON, RETICCTPCT in the last 72 hours.    Radiology Studies: I have reviewed all of the imaging during this hospital visit personally     Scheduled Meds: . abacavir-dolutegravir-lamiVUDine  1 tablet Oral Daily  . Chlorhexidine Gluconate Cloth  6 each Topical Daily  . DULoxetine  20 mg Oral Daily  . enoxaparin (LOVENOX) injection  40 mg Subcutaneous Daily  . feeding supplement  237 mL Oral BID BM  . fentaNYL  1 patch Transdermal Q72H  . gabapentin  600 mg Oral BID  . lactose free nutrition  237 mL Oral TID WC  .  liver oil-zinc oxide   Topical BID  . senna-docusate  1 tablet Oral BID  . sucralfate  1 g Oral BID   Continuous Infusions:   LOS: 13 days        Royalty Fakhouri Gerome Apley, MD

## 2020-03-26 DIAGNOSIS — C210 Malignant neoplasm of anus, unspecified: Secondary | ICD-10-CM | POA: Diagnosis not present

## 2020-03-26 LAB — CREATININE, SERUM
Creatinine, Ser: 0.58 mg/dL (ref 0.44–1.00)
GFR, Estimated: >60 mL/min (ref >60)

## 2020-03-26 MED ORDER — HYDROMORPHONE HCL 2 MG PO TABS
4.0000 mg | ORAL_TABLET | ORAL | Status: DC | PRN
  Administered 2020-03-26 – 2020-03-27 (×4): 4 mg via ORAL
  Filled 2020-03-26 (×4): qty 2

## 2020-03-26 NOTE — Progress Notes (Signed)
Physical Therapy Treatment Patient Details Name: AKIERA ALLBAUGH MRN: [id number] DOB: March 06, 1951 Today's Date: 03/26/2020    History of Present Illness 69 year old female with PMH including HTN, HIV,  malnutrition and anal squamous cell carcinoma on chemotherapy who previously was admitted on 10/16 for rectal bleeding and pain and found to have a necrotic anal rectal mass.  patient opted for conservative management. It was recommended she go to a skilled nursing facility, but patient declined this and she was discharged 10/24. 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     PT Comments    Pt received in good spirits and willing for therapy. She is supervision with bed mobility sidelying<>sit, using handrails to maneuver herself up into sitting. Pain is 8/10 at the colostomy site. Min G to stand for safety. Ambulates in hallway for 130 ft with s/o slight fatigue and no c/o of increased pain. Her endurance has increased as shown by her ability to complete increased distance without need to standing rest break. VCs needed to keep RW at a safe distance. After completing ambulation exercise, pt reports being in a lot of pain "9/10". There is an apparent increase in gas pain from beginning of session to end, as demonstrated by pt's demeanor. Pt requires increased time to get into sitting due to rectum pain. Once finally in sidelying, she reverts to fetal position and called nursing to ask for pain meds. Still very generous for PT.  Follow Up Recommendations  Home health PT (Hospice, per chart review)     Equipment Recommendations  None recommended by PT    Recommendations for Other Services OT consult     Precautions / Restrictions Precautions Precautions: None Precaution Comments: new ostomy Restrictions Weight Bearing Restrictions: No    Mobility  Bed  Mobility Overal bed mobility: Modified Independent Bed Mobility: Sidelying to Sit;Sit to Sidelying   Sidelying to sit: Supervision     Sit to sidelying: Supervision    Transfers Overall transfer level: Needs assistance Equipment used: None Transfers: Sit to/from Stand Sit to Stand: Min guard Stand pivot transfers: Min guard       General transfer comment: min G for safety  Ambulation/Gait Ambulation/Gait assistance: Min guard Gait Distance (Feet): 130 Feet Assistive device: Rolling walker (2 wheeled) Gait Pattern/deviations: Step-through pattern;Decreased stride length;Trunk flexed Gait velocity: decr       Stairs             Wheelchair Mobility    Modified Rankin (Stroke Patients Only)       Balance                                            Cognition Arousal/Alertness: Awake/alert Behavior During Therapy: WFL for tasks assessed/performed Overall Cognitive Status: Within Functional Limits for tasks assessed                                        Exercises      General Comments        Pertinent Vitals/Pain Pain Assessment: 0-10 Pain Score: 9  Pain Location: colostomy site Pain Descriptors / Indicators: Grimacing;Guarding;Sore;Discomfort    Home Living  Prior Function            PT Goals (current goals can now be found in the care plan section) Acute Rehab PT Goals Patient Stated Goal: brush teeth PT Goal Formulation: With patient Time For Goal Achievement: 03/29/20 Potential to Achieve Goals: Fair Progress towards PT goals: Progressing toward goals    Frequency    Min 2X/week      PT Plan Current plan remains appropriate    Co-evaluation              AM-PAC PT "6 Clicks" Mobility   Outcome Measure  Help needed turning from your back to your side while in a flat bed without using bedrails?: A Little Help needed moving from lying on your back to  sitting on the side of a flat bed without using bedrails?: A Little Help needed moving to and from a bed to a chair (including a wheelchair)?: A Little Help needed standing up from a chair using your arms (e.g., wheelchair or bedside chair)?: A Little Help needed to walk in hospital room?: A Little Help needed climbing 3-5 steps with a railing? : A Lot 6 Click Score: 17    End of Session Equipment Utilized During Treatment: Gait belt Activity Tolerance: Patient tolerated treatment well Patient left: in bed;with call bell/phone within reach;with family/visitor present;with bed alarm set Nurse Communication: Patient requests pain meds;Mobility status PT Visit Diagnosis: Difficulty in walking, not elsewhere classified (R26.2)     Time: 9295-7473 PT Time Calculation (min) (ACUTE ONLY): 25 min  Charges:  $Gait Training: 8-22 mins $Therapeutic Activity: 8-22 mins                     C. Parks [place], [facility] [phone]

## 2020-03-26 NOTE — Progress Notes (Signed)
PROGRESS NOTE    Vanessa Beard  ZOX:[id number] DOB: 1950/08/19 DOA: 03/12/2020 PCP: Seward Carol, MD    Brief Narrative:  Mrs. Vanessa Beard was admitted to the hospital with a working diagnosis of worsening anorectal squamous cell carcinoma. Now sp diverting colostomy  69 year old female with past medical history for hypertension, HIV, severe protein calorie malnutrition and anorectalsquamous cell carcinoma. Patient was readmitted 24 hours after discharge from the hospital. On that priorhospitalization patient was diagnosed with necrotic anal rectal mass, she declined palliative diverting colostomy. She underwent IR guided nerve block and was discharged with oral analgesics. At home her pain became intractable that prompted her to come back to the hospital. On her initial physical examination blood pressure 121/79, heart rate 62, temperature 98.2, respiratory rate 11, oxygen saturation 96%, her lungswere clear to auscultation bilaterally, abdomen was soft nontender, no lower extremity edema.  Patientwas placed onanalgesicregimen and surgery was consulted. She underwent palliative diverting colostomy 03/19/2020.  Palliative care services have been consulted, patient will be discharged to rehab toattempt toregain functional status.  She had severe and persistent pain, required frequent adjustment of analgesics regimen.  Improving pain control, her insurance has declined SNF, patient has decided to go home with home hospice when more stable, likely in 24 H.     Assessment & Plan:   Principal Problem:   Anal cancer ([place]) Active Problems:   Human immunodeficiency virus (HIV) disease ([place])   Chronic hepatitis C virus infection ([place])   Essential hypertension   Protein-calorie malnutrition, severe   Palliative care by specialist   General weakness   Cancer associated pain   1. Anorectal squamous cell carcinoma/ rectal mass, chronicanemia due to  malignancy/acutepostoperativeanemia. SP laparoscopic ostomy placement on 11/01.  Patient continue to have episodes of uncontrolled and severe abdominal pain, improved with IV hydromorphone. She is tolerating po well, continue to be very weak and deconditioned.   Continue current dose of fentanyl patch45mcg,increase frequency of hydromorphone PRN po to 2 h from 3 H. Continue with IV hydromorphone every 6 H as needed.   Continue with gabapentin 600 mg bid and tid 1000 mg acetaminophen.   Continue with bowel regimen.  She will follow up with hospice at discharge.   2. HIV.Continue with currentantiretroviral therapy, I called the ID clinica and inpatient pharmacy for options to continue antiretroviral therapy as outpatient, since this medication is not on formulary for hospice.   3. Depression.Continue withduloxetine.   4. Severe calorie protein malnutrition. On nutritional supplements.  5. Hypokalemia.No further nausea or vomiting.  6. GERD.continuesucralfate.   Status is: Inpatient  Remains inpatient appropriate because:Inpatient level of care appropriate due to severity of illness   Dispo: The patient is from: Home              Anticipated d/c is to: Home              Anticipated d/c date is: 1 day              Patient currently is not medically stable to d/c. Plan for discharge home in am with hospice team.     DVT prophylaxis: enoxaparin  Code Status:   dnr Family Communication:  No family at the bedside      Nutrition Status: Nutrition Problem: Severe Malnutrition Etiology: chronic illness, cancer and cancer related treatments Signs/Symptoms: percent weight loss, severe fat depletion, severe muscle depletion Percent weight loss: 16 % (x 2.5 months) Interventions: Boost Plus, MVI, Magic cup    Consultants:  Palliative care  Surgery   Procedures:   Diverting colostomy     Subjective: Patient continue to episodic pain, improved  with hydromorphone IV, no nausea or vomiting, and positive bowel movements.   Objective: Vitals:   03/25/20 0600 03/25/20 1337 03/25/20 2101 03/26/20 0618  BP: 102/73 97/64 94/71  125/66  Pulse: 73 78 75 81  Resp: 16 19 18 18   Temp: 98 F (36.7 C) 99.4 F (37.4 C) 98.4 F (36.9 C) 98.4 F (36.9 C)  TempSrc: Oral Oral Oral Oral  SpO2: 100% 95% 98% 96%  Weight:      Height:        Intake/Output Summary (Last 24 hours) at 03/26/2020 1104 Last data filed at 03/26/2020 1000 Gross per 24 hour  Intake 1020 ml  Output 700 ml  Net 320 ml   Filed Weights   03/12/20 0550  Weight: 58.5 kg    Examination:   General:  Deconditioned, at the time of my examination not in pain, after IV hydromorphone.  Neurology: Awake and alert, non focal  E ENT: no pallor, no icterus, oral mucosa moist Cardiovascular: No JVD. S1-S2 present, rhythmic.  No lower extremity edema. Pulmonary: vesicular breath sounds bilaterally, adequate air movement, no wheezing, rhonchi or rales. Gastrointestinal. Abdomen tender to deep palpation at the left side, with no rebound or guarding. Colostomy in place with brown formed stool.  Skin. No rashes Musculoskeletal: no joint deformities     Data Reviewed: I have personally reviewed following labs and imaging studies  CBC: Recent Labs  Lab 03/20/20 0803 03/21/20 0444 03/22/20 0451  WBC 6.4 5.7  --   NEUTROABS  --  4.6  --   HGB 8.1* 7.2* 7.2*  HCT 25.0* 23.3* 23.0*  MCV 98.0 102.6*  --   PLT 150 125*  --    Basic Metabolic Panel: Recent Labs  Lab 03/20/20 0803 03/21/20 0444 03/26/20 0508  NA 134* 136  --   K 4.9 4.6  --   CL 104 105  --   CO2 21* 26  --   GLUCOSE 117* 106*  --   BUN 16 16  --   CREATININE 0.71 0.65 0.58  CALCIUM 9.2 8.9  --    GFR: Estimated Creatinine Clearance: 51.6 mL/min (by C-G formula based on SCr of 0.58 mg/dL). Liver Function Tests: No results for input(s): AST, ALT, ALKPHOS, BILITOT, PROT, ALBUMIN in the last 168  hours. No results for input(s): LIPASE, AMYLASE in the last 168 hours. No results for input(s): AMMONIA in the last 168 hours. Coagulation Profile: No results for input(s): INR, PROTIME in the last 168 hours. Cardiac Enzymes: No results for input(s): CKTOTAL, CKMB, CKMBINDEX, TROPONINI in the last 168 hours. BNP (last 3 results) No results for input(s): PROBNP in the last 8760 hours. HbA1C: No results for input(s): HGBA1C in the last 72 hours. CBG: No results for input(s): GLUCAP in the last 168 hours. Lipid Profile: No results for input(s): CHOL, HDL, LDLCALC, TRIG, CHOLHDL, LDLDIRECT in the last 72 hours. Thyroid Function Tests: No results for input(s): TSH, T4TOTAL, FREET4, T3FREE, THYROIDAB in the last 72 hours. Anemia Panel: No results for input(s): VITAMINB12, FOLATE, FERRITIN, TIBC, IRON, RETICCTPCT in the last 72 hours.    Radiology Studies: I have reviewed all of the imaging during this hospital visit personally     Scheduled Meds: . abacavir-dolutegravir-lamiVUDine  1 tablet Oral Daily  . acetaminophen  1,000 mg Oral Q8H  . Chlorhexidine Gluconate Cloth  6 each Topical  Daily  . DULoxetine  20 mg Oral Daily  . enoxaparin (LOVENOX) injection  40 mg Subcutaneous Daily  . feeding supplement  237 mL Oral BID BM  . fentaNYL  1 patch Transdermal Q72H  . gabapentin  600 mg Oral BID  . lactose free nutrition  237 mL Oral TID WC  . liver oil-zinc oxide   Topical BID  . senna-docusate  1 tablet Oral BID  . sucralfate  1 g Oral BID   Continuous Infusions:   LOS: 14 days        Gladstone Rosas Gerome Apley, MD

## 2020-03-26 NOTE — Progress Notes (Signed)
7 Days Post-Op    CC: Rectal pain  Subjective:  She's having continued rectal pain.  Ostomy is working well.  Port sites all look good.  Objective: Vital signs in last 24 hours: Temp:  [98.4 F (36.9 C)-99.4 F (37.4 C)] 98.4 F (36.9 C) (11/08 0618) Pulse Rate:  [75-81] 81 (11/08 0618) Resp:  [18-19] 18 (11/08 0618) BP: (94-125)/(64-71) 125/66 (11/08 0618) SpO2:  [95 %-98 %] 96 % (11/08 0618) Last BM Date: 03/26/20 720 PO Urine 250 Stool 400 Afebrile, VSS H/H 7.2/23  Intake/Output from previous day: 11/07 0701 - 11/08 0700 In: 1197 [P.O.:1197] Out: 700 [Urine:700] Intake/Output this shift: Total I/O In: 60 [P.O.:60] Out: 0   General appearance: alert, cooperative and no distress Resp: clear to auscultation bilaterally GI: soft, sore, ostomy looks good, working well.  Abdomen is soft and not distended  Lab Results:  No results for input(s): WBC, HGB, HCT, PLT in the last 72 hours.  BMET Recent Labs    03/26/20 0508  CREATININE 0.58   PT/INR No results for input(s): LABPROT, INR in the last 72 hours.  No results for input(s): AST, ALT, ALKPHOS, BILITOT, PROT, ALBUMIN in the last 168 hours.   Lipase     Component Value Date/Time   LIPASE 25 11/02/2019 0030     Medications: . abacavir-dolutegravir-lamiVUDine  1 tablet Oral Daily  . acetaminophen  1,000 mg Oral Q8H  . Chlorhexidine Gluconate Cloth  6 each Topical Daily  . DULoxetine  20 mg Oral Daily  . enoxaparin (LOVENOX) injection  40 mg Subcutaneous Daily  . feeding supplement  237 mL Oral BID BM  . fentaNYL  1 patch Transdermal Q72H  . gabapentin  600 mg Oral BID  . lactose free nutrition  237 mL Oral TID WC  . liver oil-zinc oxide   Topical BID  . senna-docusate  1 tablet Oral BID  . 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 Marcello Moores - 03/19/2020  - WOCN following for colostomy teaching   - Mobilize- PT recommending SNF (apparently SNF was denied by insurance)  -Tolerating regular diet and having bowel function  - Primary issue is still rectal pain.  FEN -Reg diet. Ensure VTE -SCDs,Lovenox ID -cefotetan periop Foley - d/c'd 11/2. Plan - Continue colostomy teaching.   Okay for d/c from our standpoint. Follow up is on the chart in AVS.      LOS: 14 days    Shann Medal 03/26/2020 Please see Amion

## 2020-03-26 NOTE — Consult Note (Signed)
[facility] Nurse ostomy follow up Stoma type/location: RLQ colostomy  Patient is doubled over in intractable pain. Her pouch is intact and RN is present at bedside.  She has given analgesia but will provide more as ordered.  I will try and return later but patient is unable to participate in learning.  Will follow.  Domenic Moras MSN, RN, FNP-BC CWON Wound, Ostomy, Continence Nurse Pager [phone]

## 2020-03-26 NOTE — TOC Progression Note (Signed)
Transition of Care [facility]) - Progression Note    Patient Details  Name: Vanessa Beard MRN: [id number] Date of Birth: Sep 15, 1950  [facility] CM/SW Contact  Lennart Pall, LCSW Phone Number: 03/26/2020, 1:58 PM  Clinical Narrative:   Met with pt this morning and alerted that we may be nearing readiness for dc home.  She is aware that her insurance has denied coverage of SNF.  Pt very quiet and simply nods her head and says that her daughter is back in town.   I was able to reach daughter, Vanessa Beard, who confirms she is back in [place] and is currently trying to reach the Manufacturing engineer for pt's apartment to confirm whether or not the heating has been fixed.  She understands that we are readying for pt's dc and that recommendation is for 24/7 support.   Daughter is able to stay with pt and is working with neighbor and family members to assist with coverage when she is at work.  We, also, discussed potential referral to Hospice and have alerted MDs involved that daughter would like more information in order to make a decision on this.  Will continue to follow.    Expected Discharge Plan: [place] Barriers to Discharge: Continued Medical Work up  Expected Discharge Plan and Services Expected Discharge Plan: [place]   Discharge Planning Services: CM Consult Post Acute Care Choice: [place] Living arrangements for the past 2 months: Apartment                                       Social Determinants of Health (SDOH) Interventions    Readmission Risk Interventions Readmission Risk Prevention Plan 03/13/2020 03/09/2020  Transportation Screening Complete Complete  PCP or Specialist Appt within 3-5 Days Complete Complete  HRI or [place] Complete Complete  Social Work Consult for [place] Planning/Counseling Complete Complete  Palliative Care Screening Complete Complete  Medication Review Human resources officer) - Complete  Some recent data might be hidden

## 2020-03-26 NOTE — Progress Notes (Signed)
HEMATOLOGY-ONCOLOGY PROGRESS NOTE  SUBJECTIVE: I saw Vanessa Beard and her daughter at bed side in late afternoon. She states her pain is still not well controlled, but overall not bad.  Her insurance has denied she wants to try chemo again.  Her daughter just came back from a cruise trip yesterday.  She has been in bed most of time, did use bathroom with a walker and 1 person assistance.  She walked in the hallway with physical therapy for a short distance.   Oncology History Overview Note  Cancer Staging Anal cancer ([place]) Staging form: Anus, AJCC 8th Edition - Clinical: Stage IV (cT2, cN0, cM1) - Signed by Truitt Merle, MD on 08/12/2019    Anal cancer ([place])  07/19/2019 Procedure   Colonoscopy by Dr Havery Moros 07/19/19  IMPRESSION - Ulcer noted at the anal canal in posterior midline canal that extends into the distal rectum on digital rectal exam, nodular and somewhat hard to palpation. Endoscopic images show nodular tissue in the area, concerning for malignant ulcer. - The examined portion of the ileum was normal. - One 3 mm polyp at the hepatic flexure, removed with a cold snare. Resected and retrieved. - One 5 mm polyp in the transverse colon, removed with a cold snare. Resected and retrieved. - Diverticulosis in the ascending colon. - The examination was otherwise normal.   07/19/2019 Initial Biopsy   Diagnosis 07/19/19 1. Transverse Colon Polyp, hepatic flexure (2) - TUBULAR ADENOMA (1 OF 3 FRAGMENTS) - BENIGN COLONIC MUCOSA (2 OF 3 FRAGMENTS) - NO HIGH GRADE DYSPLASIA OR MALIGNANCY IDENTIFIED 2. 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    Sub-cm hypermetabolic lymph nodes in posterior perirectal space, right inguinal region, right iliac chain, and gastrohepatic ligament, suspicious for metastatic disease.   Small hypermetabolic left axillary lymph nodes. This would be unusual location for metastatic anal carcinoma. Recommend clinical correlation for possibility the patient has had recent COVID vaccination in the left arm, which could explain this finding.   Multifocal airspace disease in both lower lungs with marked hypermetabolic activity. This favors infectious or inflammatory etiology, and is not typical for pulmonary metastases. Short-term follow-up by chest CT is recommended.   08/15/2019 - 10/05/2019 Radiation Therapy   Concurrent ChemoRT with Dr. Lisbeth Renshaw starting 08/15/19-10/05/19   08/15/2019 - 09/12/2019 Chemotherapy   Concurrent ChemoRT with Mitomycin and 5FU on week 1 and 5 starting 08/15/19 with week 5 dose on 09/12/19.    11/02/2019 Imaging   CT CAP w contrast  IMPRESSION: No acute findings in the chest, abdomen or pelvis.   Biapical and bibasilar scarring. Bronchiectasis and paraseptal emphysema in the lung bases.   Tortuous aorta.   Small umbilical hernia containing fat.   01/05/2020 Imaging   PET SCAN IMPRESSION: 1. Partial metabolic response. Persistent anal mass hypermetabolism, decreased. No residual hypermetabolic nodal metastases in the abdomen or pelvis. Left axillary hypermetabolic lymph nodes are decreased in size and metabolism. No new or progressive hypermetabolic metastatic disease. 2. Hypermetabolism associated with patchy consolidative airspace opacities in the mid to lower lungs bilaterally, decreased in extent and metabolism, favoring resolving inflammatory opacities. 3. Aortic Atherosclerosis (ICD10-I70.0) and Emphysema (  ICD10-J43.9).   01/27/2020 - 02/17/2020 Chemotherapy   The patient had pembrolizumab (KEYTRUDA) 200 mg in sodium chloride 0.9 % 50 mL chemo infusion, 200 mg,  Intravenous, Once, 2 of 6 cycles Administration: 200 mg (01/27/2020), 200 mg (02/17/2020)  for chemotherapy treatment.    03/14/2020 -  Chemotherapy   The patient had dexamethasone (DECADRON) 4 MG tablet, 8 mg, Oral, Daily, 0 of 1 cycle, Start date: --, End date: -- palonosetron (ALOXI) injection 0.25 mg, 0.25 mg, Intravenous,  Once, 0 of 4 cycles leucovorin 624 mg in dextrose 5 % 250 mL infusion, 400 mg/m2 = 624 mg, Intravenous,  Once, 0 of 4 cycles oxaliplatin (ELOXATIN) 110 mg in dextrose 5 % 500 mL chemo infusion, 70 mg/m2 = 110 mg (100 % of original dose 70 mg/m2), Intravenous,  Once, 0 of 4 cycles Dose modification: 70 mg/m2 (original dose 70 mg/m2, Cycle 1, Reason: Provider Judgment) fluorouracil (ADRUCIL) 3,100 mg in sodium chloride 0.9 % 88 mL chemo infusion, 2,000 mg/m2 = 3,100 mg (100 % of original dose 2,000 mg/m2), Intravenous, 1 Day/Dose, 0 of 4 cycles Dose modification: 2,000 mg/m2 (original dose 2,000 mg/m2, Cycle 1, Reason: Provider Judgment)  for chemotherapy treatment.       I have reviewed the past medical history, past surgical history, social history and family history with the patient and they are unchanged from previous note.   PHYSICAL EXAMINATION: ECOG PERFORMANCE STATUS: 3 - Symptomatic, >50% confined to bed  Vitals:   03/26/20 0618 03/26/20 1351  BP: 125/66 96/70  Pulse: 81 88  Resp: 18 18  Temp: 98.4 F (36.9 C) 98 F (36.7 C)  SpO2: 96% 93%   Filed Weights   03/12/20 0550  Weight: 129 lb (58.5 kg)    Intake/Output from previous day: 11/07 0701 - 11/08 0700 In: 1197 [P.O.:1197] Out: 700 [Urine:700]  GENERAL:alert, no distress and comfortable SKIN: skin color, texture, turgor are normal, no rashes or significant lesions NEURO: alert & oriented x 3 with fluent speech, no focal motor/sensory deficits  LABORATORY DATA:  I have reviewed the data as listed CMP Latest Ref Rng & Units 03/26/2020 03/21/2020 03/20/2020  Glucose 70 - 99 mg/dL - 106(H)  117(H)  BUN 8 - 23 mg/dL - 16 16  Creatinine 0.44 - 1.00 mg/dL 0.58 0.65 0.71  Sodium 135 - 145 mmol/L - 136 134(L)  Potassium 3.5 - 5.1 mmol/L - 4.6 4.9  Chloride 98 - 111 mmol/L - 105 104  CO2 22 - 32 mmol/L - 26 21(L)  Calcium 8.9 - 10.3 mg/dL - 8.9 9.2  Total Protein 6.5 - 8.1 g/dL - - -  Total Bilirubin 0.3 - 1.2 mg/dL - - -  Alkaline Phos 38 - 126 U/L - - -  AST 15 - 41 U/L - - -  ALT 0 - 44 U/L - - -    Lab Results  Component Value Date   WBC 5.7 03/21/2020   HGB 7.2 (L) 03/22/2020   HCT 23.0 (L) 03/22/2020   MCV 102.6 (H) 03/21/2020   PLT 125 (L) 03/21/2020   NEUTROABS 4.6 03/21/2020    CT GUIDED NEEDLE PLACEMENT  Result Date: 03/06/2020 INDICATION: 69 year old female with anal cancer, non remaining pain, referred for pudendal nerve block EXAM: CT GUIDANCE NEEDLE PLACEMENT MEDICATIONS: None. ANESTHESIA/SEDATION: None FLUOROSCOPY TIME:  CT COMPLICATIONS: None PROCEDURE: Informed written consent was obtained from the patient after a thorough discussion of the procedural risks, benefits and alternatives. All questions were addressed. Maximal Sterile Barrier Technique was  utilized including caps, mask, sterile gowns, sterile gloves, sterile drape, hand hygiene and skin antiseptic. A timeout was performed prior to the initiation of the procedure. Patient was position prone position on the CT gantry table. Scout CT was acquired for planning purposes. The patient was then prepped and draped in the usual sterile fashion. 1% lidocaine was used for local anesthesia. Using CT guidance, bilateral transgluteal approach was performed using 22 gauge 15 cm Chiba needles, targeting the pudendal canal within the bilateral pelvis. Once we confirmed needle tip position with CT, small amount of 40% dilute contrast was injected to assure that the needle tip was not in a vascular space. We then injected a solution of 8 cc 5% bupivacaine and 1 mL (40 mg) Kenalog at both needle sites. Needles were removed  and a final CT was performed. Patient tolerated the procedure well and remained hemodynamically stable throughout. No complications were encountered and no significant blood loss. IMPRESSION: Status post CT-guided needle placement at the bilateral pudendal canal for bilateral pudendal nerve block Signed, Dulcy Fanny. Dellia Nims, RPVI [facility] Radiology Electronically Signed   By: Corrie Mckusick D.O.   On: 03/06/2020 16:31   CT GUIDED NEEDLE PLACEMENT  Result Date: 03/06/2020 INDICATION: 69 year old female with anal cancer, non remaining pain, referred for pudendal nerve block EXAM: CT GUIDANCE NEEDLE PLACEMENT MEDICATIONS: None. ANESTHESIA/SEDATION: None FLUOROSCOPY TIME:  CT COMPLICATIONS: None PROCEDURE: Informed written consent was obtained from the patient after a thorough discussion of the procedural risks, benefits and alternatives. All questions were addressed. Maximal Sterile Barrier Technique was utilized including caps, mask, sterile gowns, sterile gloves, sterile drape, hand hygiene and skin antiseptic. A timeout was performed prior to the initiation of the procedure. Patient was position prone position on the CT gantry table. Scout CT was acquired for planning purposes. The patient was then prepped and draped in the usual sterile fashion. 1% lidocaine was used for local anesthesia. Using CT guidance, bilateral transgluteal approach was performed using 22 gauge 15 cm Chiba needles, targeting the pudendal canal within the bilateral pelvis. Once we confirmed needle tip position with CT, small amount of 40% dilute contrast was injected to assure that the needle tip was not in a vascular space. We then injected a solution of 8 cc 5% bupivacaine and 1 mL (40 mg) Kenalog at both needle sites. Needles were removed and a final CT was performed. Patient tolerated the procedure well and remained hemodynamically stable throughout. No complications were encountered  and no significant blood loss. IMPRESSION: Status post CT-guided needle placement at the bilateral pudendal canal for bilateral pudendal nerve block Signed, Dulcy Fanny. Dellia Nims, RPVI [facility] Radiology Electronically Signed   By: Corrie Mckusick D.O.   On: 03/06/2020 16:31   CT ABDOMEN PELVIS W CONTRAST  Result Date: 03/14/2020 CLINICAL DATA:  Squamous cell carcinoma of the anus. EXAM: CT ABDOMEN AND PELVIS WITH CONTRAST TECHNIQUE: Multidetector CT imaging of the abdomen and pelvis was performed using the standard protocol following bolus administration of intravenous contrast. CONTRAST:  139mL OMNIPAQUE IOHEXOL 300 MG/ML  SOLN COMPARISON:  03/01/2020 FINDINGS: Lower chest: Bronchiectasis with parenchymal scarring noted in the lung bases bilaterally. Hepatobiliary: No suspicious focal abnormality within the liver parenchyma. Gallbladder not visualized in either markedly decompressed or surgically absent. No intrahepatic or extrahepatic biliary dilation. Pancreas: No focal mass lesion. No dilatation of the main duct. No intraparenchymal cyst. No peripancreatic edema. Spleen: No splenomegaly. No focal mass lesion.  Adrenals/Urinary Tract: No adrenal nodule or mass. Stable 9 mm low-density lesion upper pole right kidney, incompletely characterized but likely a cyst. Early excretion of contrast material noted in calices of both kidneys. No renal stone disease visible on the exam from 12 days ago. No evidence for hydroureter. 2.6 x 1.3 cm region of apparent focal posterior bladder wall thickening (image 60/series 2) is indeterminate as this finding was not visible on the scan from 12 days ago. Although today it is highly suspicious for urothelial neoplasm, given that it appears new in the interval, layering debris/blood products is the probable etiology. Nevertheless, close follow-up recommended. Stomach/Bowel: Stomach is unremarkable. No gastric wall thickening. No  evidence of outlet obstruction. Duodenum is normally positioned as is the ligament of Treitz. No small bowel wall thickening. No small bowel dilatation. The terminal ileum is normal. The appendix is normal. Wall thickening in the low rectum/anus again noted with heterogeneous enhancement, features compatible with the patient's known neoplasm. Vascular/Lymphatic: There is abdominal aortic atherosclerosis without aneurysm. There is no gastrohepatic or hepatoduodenal ligament lymphadenopathy. No retroperitoneal or mesenteric lymphadenopathy. No pelvic sidewall lymphadenopathy. Reproductive: Calcified fibroid noted in the uterus. There is no adnexal mass. Other: No intraperitoneal free fluid. Musculoskeletal: Soft tissue gas in the pelvic floor compatible with recent CT-guided bilateral pudendal nerve block. No worrisome lytic or sclerotic osseous abnormality. Multilevel compression deformity noted lower thoracic and lumbar spine. IMPRESSION: 1. 2.6 x 1.3 cm region of apparent focal posterior bladder wall thickening is indeterminate as this finding was not visible on the scan from 12 days ago. Although today, it has an appearance suspicious for urothelial neoplasm, given that it was not visible previously, layering debris/blood products is the probable etiology. Nevertheless, close follow-up recommended to ensure that it resolves. 2. No substantial change in the patient's known anal rectal mass. No evidence for metastatic disease in the abdomen or pelvis. 3. Soft tissue gas in the pelvic floor compatible with recent CT-guided bilateral pudendal nerve block. 4. Bronchiectasis with parenchymal scarring in the lung bases bilaterally. 5. Aortic Atherosclerosis (ICD10-I70.0). Electronically Signed   By: Misty Stanley M.D.   On: 03/14/2020 05:30   CT ABDOMEN PELVIS W CONTRAST  Result Date: 03/01/2020 CLINICAL DATA:  Colorectal cancer, new anal mass, difficulty voiding EXAM: CT ABDOMEN AND PELVIS WITH CONTRAST TECHNIQUE:  Multidetector CT imaging of the abdomen and pelvis was performed using the standard protocol following bolus administration of intravenous contrast. CONTRAST:  114mL OMNIPAQUE IOHEXOL 300 MG/ML  SOLN COMPARISON:  11/02/2019 FINDINGS: Lower chest: No acute abnormality. Fibrotic scarring of the included bilateral lung bases. Hepatobiliary: No focal liver abnormality is seen. Status post cholecystectomy. Postoperative biliary dilatation. Pancreas: Unremarkable. No pancreatic ductal dilatation or surrounding inflammatory changes. Spleen: Normal in size without significant abnormality. Adrenals/Urinary Tract: Adrenal glands are unremarkable. Kidneys are normal, without renal calculi, solid lesion, or hydronephrosis. Bladder is unremarkable. Stomach/Bowel: Stomach is within normal limits. Appendix appears normal. There is an irregular, rim enhancing soft tissue mass about the anus, containing air and fluid, measuring overall approximately 5.4 x 2.4 cm (series 2, image 76). No significant change in perirectal and presacral soft tissue stranding (series 2, image 67). Vascular/Lymphatic: Scattered aortic atherosclerosis. No enlarged abdominal or pelvic lymph nodes. Reproductive: Small calcified uterine fibroids. Other: No abdominal wall hernia or abnormality. No abdominopelvic ascites. Musculoskeletal: No acute or significant osseous findings. Unchanged superior endplate deformities of T12 and L4. IMPRESSION: 1. There is an irregular, rim enhancing soft tissue mass about the anus, containing air  and fluid, measuring overall approximately 5.4 x 2.4 cm. Findings most suggestive of necrotic anorectal mass. Correlate with physical exam findings. 2. Unchanged fat stranding of the perirectal and presacral soft tissues, in keeping with prior radiation therapy. 3. No evidence of lymphadenopathy or metastatic disease in the abdomen or pelvis. 4. Aortic Atherosclerosis (ICD10-I70.0). Electronically Signed   By: Eddie Candle M.D.   On:  03/01/2020 13:57   DG Chest Port 1 View  Result Date: 03/09/2020 CLINICAL DATA:  Status post Port-A-Cath placement. EXAM: PORTABLE CHEST 1 VIEW COMPARISON:  May 04, 2018. FINDINGS: Stable cardiomegaly. Interval placement of right internal jugular Port-A-Cath with distal tip in expected position of the right brachiocephalic vein. No pneumothorax is noted. Mild bibasilar subsegmental atelectasis is noted. Bony thorax is unremarkable. IMPRESSION: Interval placement of right internal jugular Port-A-Cath with distal tip in expected position of the right brachiocephalic vein. Electronically Signed   By: Marijo Conception M.D.   On: 03/09/2020 09:25   DG C-Arm 1-60 Min-No Report  Result Date: 03/09/2020 Fluoroscopy was utilized by the requesting physician.  No radiographic interpretation.    ASSESSMENT AND PLAN: 1.  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  Anemia 3.  HIV 4.  History of hepatitis C 5.  Hypertension 6.  Hyperparathyroidism 7.  Osteoporosis  -Patient previously repeatedly stated that she does not want chemo, and wanted to go to SNF but it was denied by her insurance.  She now wants to try chemo, to see if that will prolong her life.  Unfortunately due to her poor performance status, chemo is not beneficial at this point.  We had a long conversation with patient and her daughter, about the limited benefits and side effects of chemotherapy, and I won't be able to offer chemotherapy if her performance status remains to be very low like now.  Patient would like to go home with physical therapy, to see if she can get little stronger.  At the end of conversation, patient and her daughter agreed to follow-up with me in 2 weeks in office and re-evaluate if she is able to do chemotherapy. -We discussed safety measurements at home, her daughter and girlfriend will watch her at home but not 24/7.  -Dr.  Hilma Favors is going to discuss with patient and her daughter after my visit, and will adjust her pain medicine as needed. -Her hemoglobin was 7.2 last week, please repeat CBC tomorrow, transfusion if hemoglobin less than 7.5. -I will f/u as needed before discharge.   Truitt Merle  03/26/2020

## 2020-03-27 LAB — HEMOGLOBIN AND HEMATOCRIT, BLOOD
HCT: 24.5 % — ABNORMAL LOW (ref 36.0–46.0)
Hemoglobin: 7.8 g/dL — ABNORMAL LOW (ref 12.0–15.0)

## 2020-03-27 MED ORDER — HYDROMORPHONE HCL 4 MG PO TABS: 4.0000 mg | ORAL_TABLET | ORAL | 0 refills | Status: DC | PRN

## 2020-03-27 MED ORDER — BOOST PLUS PO LIQD: 237.0000 mL | Freq: Three times a day (TID) | ORAL | 0 refills | Status: AC

## 2020-03-27 MED ORDER — ENSURE ENLIVE PO LIQD: 237.0000 mL | Freq: Two times a day (BID) | ORAL | 0 refills | Status: AC

## 2020-03-27 MED ORDER — FENTANYL 75 MCG/HR TD PT72: 1.0000 | MEDICATED_PATCH | TRANSDERMAL | 0 refills | Status: DC

## 2020-03-27 MED ORDER — ACETAMINOPHEN 500 MG PO TABS: 500.0000 mg | ORAL_TABLET | Freq: Four times a day (QID) | ORAL | 0 refills | Status: AC | PRN

## 2020-03-27 MED ORDER — BUTAMBEN-TETRACAINE-BENZOCAINE 2-2-14 % EX AERO
1.0000 | INHALATION_SPRAY | Freq: Four times a day (QID) | CUTANEOUS | Status: DC
  Administered 2020-03-27 (×2): 1 via TOPICAL
  Filled 2020-03-27: qty 20

## 2020-03-27 MED ORDER — SIMETHICONE 80 MG PO CHEW: 80.0000 mg | CHEWABLE_TABLET | Freq: Four times a day (QID) | ORAL | 0 refills | Status: AC | PRN

## 2020-03-27 MED ORDER — GERHARDT'S BUTT CREAM
TOPICAL_CREAM | Freq: Four times a day (QID) | CUTANEOUS | Status: DC
  Administered 2020-03-27: 1 via TOPICAL
  Filled 2020-03-27: qty 1

## 2020-03-27 NOTE — Care Management Important Message (Signed)
Important Message  Patient Details IM Letter given to the Patient Name: Vanessa Beard MRN: [id number] Date of Birth: [birth year]   Medicare Important Message Given:  Yes     [name] 03/27/2020, 10:00 AM

## 2020-03-27 NOTE — Discharge Summary (Addendum)
Physician Discharge Summary  Vanessa Beard HMC:[id number] DOB: 05/02/1951 DOA: 03/12/2020  PCP: Seward Carol, MD  Admit date: 03/12/2020 Discharge date: 03/27/2020  Admitted From: Home  Disposition:  Home   Recommendations for Outpatient Follow-up and new medication changes:  1. Follow up with Dr. Delfina Redwood in 7 days.  2. Fentanyl has been increased to 75 mcg  3. Hydromorphone has been increased to 4 mg as needed every 4 h. 4. Discontinue gabapentin to prevent side effects while on high dose opiates.  5. Follow up with palliative care as outpatient  6. Follow up with Dr. Marcello Moores on 04/09/2020.   Home Health: yes   Equipment/Devices: na    Discharge Condition: stable  CODE STATUS: DNR   Diet recommendation:  Regular as tolerated.   Brief/Interim Summary: Vanessa Beard was admitted to the hospital with a working diagnosis of worsening anorectal squamous cell carcinoma. Now sp diverting colostomy  69 year old female with past medical history for hypertension, HIV, severe protein calorie malnutrition and anorectalsquamous cell carcinoma. Patient was readmitted 24 hours after discharge from the hospital. On that priorhospitalization patient was diagnosed with necrotic anal rectal mass, she declined palliative diverting colostomy. She underwent IR guided nerve block and was discharged with oral analgesics. At home her pain became intractable that prompted her to come back to the hospital. On her initial physical examination blood pressure 121/79, heart rate 62, temperature 98.2, respiratory rate 11, oxygen saturation 96%, her lungswere clear to auscultation bilaterally, abdomen was soft nontender, no lower extremity edema.  Sodium 139, potassium 3.7, chloride 102, bicarb 28, glucose 102, BUN 20, creatinine 0.61, Gucciardo count 6.8, hemoglobin 9.1, hematocrit 28.6, platelets 144.  SARS COVID-19 negative.    CT of the abdomen 2.6 x 1.3 cm region of apparent focal posterior bladder wall  thickening, indeterminate finding.  Suspicious for urothelial neoplasm.  No substantial change in the patient's known anal rectal mass.  No evidence of metastatic disease in the abdomen.  Patientwas placed onanalgesicregimen and surgery was consulted. She underwent palliative diverting colostomy 03/19/2020.  Palliative care services was consulted and her analgesic regimen was adjusted.  Unfortunately insurance declined her to be transferred to skilled nursing facility.  Prolonged hospital stay due to severe and persistent pain, required frequent adjustment of analgesics regimen.  Pain control was achieved.  Patient decided to continue cancer treatment, patient will be discharged home with home health services and follow-up with palliative care support.   1. 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  Patient has responded well to laparoscopic ostomy placement on 11/01.  She continued to tolerate well p.o. intake, no nausea or vomiting.  Positive bowel movements. Her analgesic regimen was transitioned to fentanyl 75 mg and hydromorphone 4 mg as needed. During her hospitalization she also received acetaminophen and gabapentin.  Patient will follow up as an outpatient with oncology Dr. Burr Medico and palliative care. Currently she is not candidate for chemotherapy due to her poor functional status.   2.  HIV/ HEP C.  Continue antiretroviral therapy. Follow up with ID clinic as scheduled.   3.  Depression.  Continue duloxetine.  4.  Severe calorie protein malnutrition.  Continue nutritional supplements. Her discharge creatinine 0.65, BUN 16, sodium 136, potassium 4.6, chloride 105, bicarb 26.  5.  Hypokalemia.  Her electrolytes were corrected.   6. GERD.  Continue sucralfate.  7. Hypertension. Patient off antihypertensive medications.   Discharge Diagnoses:  Principal Problem:   Anal cancer ([place]) Active  Problems:    Human immunodeficiency virus (HIV) disease ([place])   Chronic hepatitis C virus infection ([place])   Essential hypertension   Protein-calorie malnutrition, severe   Palliative care by specialist   General weakness   Cancer associated pain    Discharge Instructions   Allergies as of 03/27/2020      Reactions   Dapsone Nausea Only   stomach  burning   Retrovir [zidovudine] Other (See Comments)   Changed skin color   Sulfamethoxazole-trimethoprim Other (See Comments)   Flu like symptoms      Medication List    STOP taking these medications   dexamethasone 2 MG tablet Commonly known as: DECADRON   fentaNYL 50 MCG/HR Commonly known as: DURAGESIC Replaced by: fentaNYL 75 MCG/HR   gabapentin 300 MG capsule Commonly known as: NEURONTIN   potassium chloride SA 20 MEQ tablet Commonly known as: KLOR-CON   zolpidem 10 MG tablet Commonly known as: AMBIEN     TAKE these medications   acetaminophen 500 MG tablet Commonly known as: TYLENOL Take 1 tablet (500 mg total) by mouth every 6 (six) hours as needed for moderate pain.   belladonna-opium 16.2-30 MG suppository Commonly known as: B&O SUPPRETTES Place 1 suppository rectally every 6 (six) hours as needed for pain.   butamben-tetracaine-benzocaine 06-20-12 % spray Commonly known as: CETACAINE Apply 1 spray topically as needed (for Bowel movement pain or perineal pain).   DULoxetine 20 MG capsule Commonly known as: CYMBALTA Take 1 capsule (20 mg total) by mouth daily.   feeding supplement Liqd Take 237 mLs by mouth 2 (two) times daily between meals.   lactose free nutrition Liqd Take 237 mLs by mouth 3 (three) times daily with meals.   fentaNYL 75 MCG/HR Commonly known as: Gross 1 patch onto the skin every 3 (three) days. Replaces: fentaNYL 50 MCG/HR   HYDROmorphone 4 MG tablet Commonly known as: DILAUDID Take 1 tablet (4 mg total) by mouth every 4 (four) hours as needed for severe pain. What changed:    medication strength  how much to take  when to take this   polyethylene glycol 17 g packet Commonly known as: MIRALAX / GLYCOLAX Take 17 g by mouth daily.   senna-docusate 8.6-50 MG tablet Commonly known as: Senokot-S Take 1 tablet by mouth 2 (two) times daily.   simethicone 80 MG chewable tablet Commonly known as: MYLICON Chew 1 tablet (80 mg total) by mouth every 6 (six) hours as needed for flatulence.   sucralfate 1 g tablet Commonly known as: CARAFATE TAKE 2 TABLETS AND MIX IN 4.5ML OF WATER AND PLACE IN ENEMA APPLICATOR USE TWICE DAILY What changed:   how much to take  how to take this  when to take this  additional instructions   Triumeq 600-50-300 MG tablet Generic drug: abacavir-dolutegravir-lamiVUDine TAKE 1 TABLET BY MOUTH DAILY       Contact information for follow-up providers    Seward Carol, MD Follow up.   Specialty: Internal Medicine Contact information: [address] [place] [address] [phone]        Carlyle Basques, MD .   Specialty: Infectious Diseases Contact information: [address] [place] [address] [phone]        Leighton Ruff, MD. Go on 29/56/2130.   Specialty: General Surgery Why: 130pm. Please arrive 30 min prior to your appointment for paperwork. Please bring a copy of your photo ID and insurance card.  Contact information: [place]  [address] [place] [address] [phone]            Contact information for after-discharge care    Destination    [facility] .   Service: Skilled Nursing Contact information: [address] [address] [phone]                 Allergies  Allergen Reactions  . Dapsone Nausea Only    stomach  burning  . Retrovir [Zidovudine] Other (See Comments)    Changed skin color  . Sulfamethoxazole-Trimethoprim Other (See Comments)    Flu like symptoms     Consultations: Oncology  Palliative Care    Procedures/Studies: CT GUIDED NEEDLE PLACEMENT  Result Date: 03/06/2020 INDICATION: 69 year old female with anal cancer, non remaining pain, referred for pudendal nerve block EXAM: CT GUIDANCE NEEDLE PLACEMENT MEDICATIONS: None. ANESTHESIA/SEDATION: None FLUOROSCOPY TIME:  CT COMPLICATIONS: None PROCEDURE: Informed written consent was obtained from the patient after a thorough discussion of the procedural risks, benefits and alternatives. All questions were addressed. Maximal Sterile Barrier Technique was utilized including caps, mask, sterile gowns, sterile gloves, sterile drape, hand hygiene and skin antiseptic. A timeout was performed prior to the initiation of the procedure. Patient was position prone position on the CT gantry table. Scout CT was acquired for planning purposes. The patient was then prepped and draped in the usual sterile fashion. 1% lidocaine was used for local anesthesia. Using CT guidance, bilateral transgluteal approach was performed using 22 gauge 15 cm Chiba needles, targeting the pudendal canal within the bilateral pelvis. Once we confirmed needle tip position with CT, small amount of 40% dilute contrast was injected to assure that the needle tip was not in a vascular space. We then injected a solution of 8 cc 5% bupivacaine and 1 mL (40 mg) Kenalog at both needle sites. Needles were removed and a final CT was performed. Patient tolerated the procedure well and remained hemodynamically stable throughout. No complications were encountered and no significant blood loss. IMPRESSION: Status post CT-guided needle placement at the bilateral pudendal canal for bilateral pudendal nerve block Signed, Dulcy Fanny. Dellia Nims, RPVI [facility] Electronically Signed   By: Corrie Mckusick D.O.   On: 03/06/2020 16:31   CT GUIDED NEEDLE PLACEMENT  Result Date: 03/06/2020 INDICATION:  69 year old female with anal cancer, non remaining pain, referred for pudendal nerve block EXAM: CT GUIDANCE NEEDLE PLACEMENT MEDICATIONS: None. ANESTHESIA/SEDATION: None FLUOROSCOPY TIME:  CT COMPLICATIONS: None PROCEDURE: Informed written consent was obtained from the patient after a thorough discussion of the procedural risks, benefits and alternatives. All questions were addressed. Maximal Sterile Barrier Technique was utilized including caps, mask, sterile gowns, sterile gloves, sterile drape, hand hygiene and skin antiseptic. A timeout was performed prior to the initiation of the procedure. Patient was position prone position on the CT gantry table. Scout CT was acquired for planning purposes. The patient was then prepped and draped in the usual sterile fashion. 1% lidocaine was used for local anesthesia. Using CT guidance, bilateral transgluteal approach was performed using 22 gauge 15 cm Chiba needles, targeting the pudendal canal within the bilateral pelvis. Once we confirmed needle tip position with CT, small amount of 40% dilute contrast was injected to assure that the needle tip was not in a vascular space. We then injected a solution of 8 cc 5% bupivacaine and 1 mL (40 mg) Kenalog at both needle sites. Needles were removed and a final CT  was performed. Patient tolerated the procedure well and remained hemodynamically stable throughout. No complications were encountered and no significant blood loss. IMPRESSION: Status post CT-guided needle placement at the bilateral pudendal canal for bilateral pudendal nerve block Signed, Dulcy Fanny. Dellia Nims, RPVI [facility] Radiology Electronically Signed   By: Corrie Mckusick D.O.   On: 03/06/2020 16:31   CT ABDOMEN PELVIS W CONTRAST  Result Date: 03/14/2020 CLINICAL DATA:  Squamous cell carcinoma of the anus. EXAM: CT ABDOMEN AND PELVIS WITH CONTRAST TECHNIQUE: Multidetector CT imaging of the abdomen and pelvis  was performed using the standard protocol following bolus administration of intravenous contrast. CONTRAST:  112mL OMNIPAQUE IOHEXOL 300 MG/ML  SOLN COMPARISON:  03/01/2020 FINDINGS: Lower chest: Bronchiectasis with parenchymal scarring noted in the lung bases bilaterally. Hepatobiliary: No suspicious focal abnormality within the liver parenchyma. Gallbladder not visualized in either markedly decompressed or surgically absent. No intrahepatic or extrahepatic biliary dilation. Pancreas: No focal mass lesion. No dilatation of the main duct. No intraparenchymal cyst. No peripancreatic edema. Spleen: No splenomegaly. No focal mass lesion. Adrenals/Urinary Tract: No adrenal nodule or mass. Stable 9 mm low-density lesion upper pole right kidney, incompletely characterized but likely a cyst. Early excretion of contrast material noted in calices of both kidneys. No renal stone disease visible on the exam from 12 days ago. No evidence for hydroureter. 2.6 x 1.3 cm region of apparent focal posterior bladder wall thickening (image 60/series 2) is indeterminate as this finding was not visible on the scan from 12 days ago. Although today it is highly suspicious for urothelial neoplasm, given that it appears new in the interval, layering debris/blood products is the probable etiology. Nevertheless, close follow-up recommended. Stomach/Bowel: Stomach is unremarkable. No gastric wall thickening. No evidence of outlet obstruction. Duodenum is normally positioned as is the ligament of Treitz. No small bowel wall thickening. No small bowel dilatation. The terminal ileum is normal. The appendix is normal. Wall thickening in the low rectum/anus again noted with heterogeneous enhancement, features compatible with the patient's known neoplasm. Vascular/Lymphatic: There is abdominal aortic atherosclerosis without aneurysm. There is no gastrohepatic or hepatoduodenal ligament lymphadenopathy. No retroperitoneal or mesenteric  lymphadenopathy. No pelvic sidewall lymphadenopathy. Reproductive: Calcified fibroid noted in the uterus. There is no adnexal mass. Other: No intraperitoneal free fluid. Musculoskeletal: Soft tissue gas in the pelvic floor compatible with recent CT-guided bilateral pudendal nerve block. No worrisome lytic or sclerotic osseous abnormality. Multilevel compression deformity noted lower thoracic and lumbar spine. IMPRESSION: 1. 2.6 x 1.3 cm region of apparent focal posterior bladder wall thickening is indeterminate as this finding was not visible on the scan from 12 days ago. Although today, it has an appearance suspicious for urothelial neoplasm, given that it was not visible previously, layering debris/blood products is the probable etiology. Nevertheless, close follow-up recommended to ensure that it resolves. 2. No substantial change in the patient's known anal rectal mass. No evidence for metastatic disease in the abdomen or pelvis. 3. Soft tissue gas in the pelvic floor compatible with recent CT-guided bilateral pudendal nerve block. 4. Bronchiectasis with parenchymal scarring in the lung bases bilaterally. 5. Aortic Atherosclerosis (ICD10-I70.0). Electronically Signed   By: Misty Stanley M.D.   On: 03/14/2020 05:30   CT ABDOMEN PELVIS W CONTRAST  Result Date: 03/01/2020 CLINICAL DATA:  Colorectal cancer, new anal mass, difficulty voiding EXAM: CT ABDOMEN AND PELVIS WITH CONTRAST TECHNIQUE: Multidetector CT imaging of the abdomen and pelvis was performed using the standard protocol following bolus  administration of intravenous contrast. CONTRAST:  118mL OMNIPAQUE IOHEXOL 300 MG/ML  SOLN COMPARISON:  11/02/2019 FINDINGS: Lower chest: No acute abnormality. Fibrotic scarring of the included bilateral lung bases. Hepatobiliary: No focal liver abnormality is seen. Status post cholecystectomy. Postoperative biliary dilatation. Pancreas: Unremarkable. No pancreatic ductal dilatation or surrounding inflammatory  changes. Spleen: Normal in size without significant abnormality. Adrenals/Urinary Tract: Adrenal glands are unremarkable. Kidneys are normal, without renal calculi, solid lesion, or hydronephrosis. Bladder is unremarkable. Stomach/Bowel: Stomach is within normal limits. Appendix appears normal. There is an irregular, rim enhancing soft tissue mass about the anus, containing air and fluid, measuring overall approximately 5.4 x 2.4 cm (series 2, image 76). No significant change in perirectal and presacral soft tissue stranding (series 2, image 67). Vascular/Lymphatic: Scattered aortic atherosclerosis. No enlarged abdominal or pelvic lymph nodes. Reproductive: Small calcified uterine fibroids. Other: No abdominal wall hernia or abnormality. No abdominopelvic ascites. Musculoskeletal: No acute or significant osseous findings. Unchanged superior endplate deformities of T12 and L4. IMPRESSION: 1. There is an irregular, rim enhancing soft tissue mass about the anus, containing air and fluid, measuring overall approximately 5.4 x 2.4 cm. Findings most suggestive of necrotic anorectal mass. Correlate with physical exam findings. 2. Unchanged fat stranding of the perirectal and presacral soft tissues, in keeping with prior radiation therapy. 3. No evidence of lymphadenopathy or metastatic disease in the abdomen or pelvis. 4. Aortic Atherosclerosis (ICD10-I70.0). Electronically Signed   By: Eddie Candle M.D.   On: 03/01/2020 13:57   DG Chest Port 1 View  Result Date: 03/09/2020 CLINICAL DATA:  Status post Port-A-Cath placement. EXAM: PORTABLE CHEST 1 VIEW COMPARISON:  May 04, 2018. FINDINGS: Stable cardiomegaly. Interval placement of right internal jugular Port-A-Cath with distal tip in expected position of the right brachiocephalic vein. No pneumothorax is noted. Mild bibasilar subsegmental atelectasis is noted. Bony thorax is unremarkable. IMPRESSION: Interval placement of right internal jugular Port-A-Cath with  distal tip in expected position of the right brachiocephalic vein. Electronically Signed   By: Marijo Conception M.D.   On: 03/09/2020 09:25   DG C-Arm 1-60 Min-No Report  Result Date: 03/09/2020 Fluoroscopy was utilized by the requesting physician.  No radiographic interpretation.      Procedures: diverting colostomy   Subjective: Patient is feeling well, her pain is well controlled, no nausea or vomiting, positive bowel movement.   Discharge Exam: Vitals:   03/27/20 0608 03/27/20 0816  BP: 94/67 98/75  Pulse: 72 75  Resp: 18 19  Temp: 98.6 F (37 C) 98.4 F (36.9 C)  SpO2: 97% 100%   Vitals:   03/26/20 1351 03/26/20 2230 03/27/20 0608 03/27/20 0816  BP: 96/70 105/79 94/67 98/75   Pulse: 88 87 72 75  Resp: 18 17 18 19   Temp: 98 F (36.7 C) 98.5 F (36.9 C) 98.6 F (37 C) 98.4 F (36.9 C)  TempSrc: Oral Oral Oral Oral  SpO2: 93% 93% 97% 100%  Weight:      Height:        General: Not in pain or dyspnea.  Neurology: Awake and alert, non focal  E ENT: mild pallor, no icterus, oral mucosa moist Cardiovascular: No JVD. S1-S2 present, rhythmic, no gallops, rubs, or murmurs. No lower extremity edema. Pulmonary: positive breath sounds bilaterally,  Gastrointestinal. Abdomen soft and non tender to superficial palpation. Ostyomy bag in place.  Skin. No rashes Musculoskeletal: no joint deformities   The results of significant diagnostics from this hospitalization (including imaging, microbiology, ancillary and laboratory) are listed below  for reference.     Microbiology: No results found for this or any previous visit (from the past 240 hour(s)).   Labs: BNP (last 3 results) No results for input(s): BNP in the last 8760 hours. Basic Metabolic Panel: Recent Labs  Lab 03/21/20 0444 03/26/20 0508  NA 136  --   K 4.6  --   CL 105  --   CO2 26  --   GLUCOSE 106*  --   BUN 16  --   CREATININE 0.65 0.58  CALCIUM 8.9  --    Liver Function Tests: No results for  input(s): AST, ALT, ALKPHOS, BILITOT, PROT, ALBUMIN in the last 168 hours. No results for input(s): LIPASE, AMYLASE in the last 168 hours. No results for input(s): AMMONIA in the last 168 hours. CBC: Recent Labs  Lab 03/21/20 0444 03/22/20 0451  WBC 5.7  --   NEUTROABS 4.6  --   HGB 7.2* 7.2*  HCT 23.3* 23.0*  MCV 102.6*  --   PLT 125*  --    Cardiac Enzymes: No results for input(s): CKTOTAL, CKMB, CKMBINDEX, TROPONINI in the last 168 hours. BNP: Invalid input(s): POCBNP CBG: No results for input(s): GLUCAP in the last 168 hours. D-Dimer No results for input(s): DDIMER in the last 72 hours. Hgb A1c No results for input(s): HGBA1C in the last 72 hours. Lipid Profile No results for input(s): CHOL, HDL, LDLCALC, TRIG, CHOLHDL, LDLDIRECT in the last 72 hours. Thyroid function studies No results for input(s): TSH, T4TOTAL, T3FREE, THYROIDAB in the last 72 hours.  Invalid input(s): FREET3 Anemia work up No results for input(s): VITAMINB12, FOLATE, FERRITIN, TIBC, IRON, RETICCTPCT in the last 72 hours. Urinalysis    Component Value Date/Time   COLORURINE AMBER (A) 03/03/2020 1628   APPEARANCEUR HAZY (A) 03/03/2020 1628   LABSPEC 1.018 03/03/2020 1628   PHURINE 6.0 03/03/2020 1628   GLUCOSEU NEGATIVE 03/03/2020 1628   HGBUR MODERATE (A) 03/03/2020 1628   BILIRUBINUR NEGATIVE 03/03/2020 [place] 03/03/2020 1628   PROTEINUR 30 (A) 03/03/2020 1628   UROBILINOGEN >8.0 (H) 11/03/2014 2000   NITRITE NEGATIVE 03/03/2020 1628   LEUKOCYTESUR MODERATE (A) 03/03/2020 1628   Sepsis Labs Invalid input(s): PROCALCITONIN,  WBC,  LACTICIDVEN Microbiology No results found for this or any previous visit (from the past 240 hour(s)).   Time coordinating discharge: 45 minutes  SIGNED:   Tawni Millers, MD  [facility] 03/27/2020, 9:09 AM

## 2020-03-27 NOTE — Progress Notes (Signed)
[facility] ([facility])  Hospital Liaison: RN note       Notified by TOC manager of patient/family request for [facility] Palliative services at home after discharge.               [facility] Palliative team will follow up with patient after discharge.       Please call with any hospice or palliative related questions.       Thank you for this referral.       Melissa O'Bryant, BSN, RN [facility] Hospital Liaison (listed on AMION under [facility] [place])   [phone]    

## 2020-03-27 NOTE — Consult Note (Signed)
   [facility] Inpatient Consult   03/27/2020  Vanessa Beard 1951/05/06 [id number]   [facility] Follow up:  Patient disposition is for home with home health services instead of skilled nursing facility.   Referral to [facility] care management services made for assessment of community needs for patient in home setting.   Of note, [facility] Care Management services does not replace or interfere with any services that are arranged by inpatient case management or social work.  Netta Cedars, MSN, [facility] Hospital Liaison Nurse Mobile Phone [phone]  Toll free office [phone]

## 2020-03-27 NOTE — Progress Notes (Signed)
Patient and family member were given discharge instructions, and all questions were answered.  Patient was taken to main exit by wheelchair. 

## 2020-03-27 NOTE — TOC Transition Note (Signed)
Transition of Care [facility]) - CM/SW Discharge Note   Patient Details  Name: Vanessa Beard MRN: [id number] Date of Birth: [birth year]  [facility] CM/SW Contact:  Lennart Pall, LCSW Phone Number: 03/27/2020, 11:18 AM   Clinical Narrative:    Pt is medically cleared for dc today.  Daughter to pick up this afternoon.  Have reviewed with pt the HHRN,PT,OT has been set up with [facility].  Pt reports that family is working together to provide her support in the home.  She is smiling today and happy to be going home.  No further TOC needs.   Final next level of care: Home w Home Health Services Barriers to Discharge: Barriers Resolved   Patient Goals and CMS Choice   CMS Medicare.gov Compare Post Acute Care list provided to:: Patient Represenative (must comment) Choice offered to / list presented to : Adult Children  Discharge Placement                       Discharge Plan and Services   Discharge Planning Services: CM Consult Post Acute Care Choice: [place]          DME Arranged: N/A DME Agency: NA       HH Arranged: RN, PT, OT [place] Agency: [facility] Date [place] Agency Contacted: 03/27/20 Time [place]: 1030 Representative spoke with at [place]: [place] ([place]) Interventions     Readmission Risk Interventions Readmission Risk Prevention Plan 03/13/2020 03/09/2020  Transportation Screening Complete Complete  PCP or Specialist Appt within 3-5 Days Complete Complete  HRI or [place] Complete Complete  Social Work Consult for [place] Planning/Counseling Complete Complete  Palliative Care Screening Complete Complete  Medication Review Press photographer) - Complete  Some recent data might be hidden

## 2020-03-27 NOTE — Consult Note (Signed)
[facility] Nurse ostomy follow up Stoma type/location: RLQ, end colostomy  Stomal assessment/size: 1 3/8" oval shaped, budded, crease with dips in the skin at 3 and 9 o'clock. Pink and moist. Os at 6 o'clock  Peristomal assessment:  Treatment options for stomal/peristomal skin: 2" skin barrier ring Output pasty brown Ostomy pouching: 1pc.flex convex with 2" skin barrier ring  Education provided:  Allowed patient to change pouch to determine needs for DC to home today Patient reports and verified independence with emptying. Patient cut new skin barrier after WOC drew pattern with coaching to not cut a hole in new pouch Patient closed lock and roll closure on new pouch Patient removed old pouch; cleaned skin Patient formed new skin barrier ring but with coaching and need to assist with placement; she has difficulty seeing while in bed Patient removed backing with difficulty and assistance needed from [facility] nurse Patient can not place pouch, she can not see the stoma with the current pouching system, we discussed need to view in bathroom mirror and may need support/assistance with this step at home Patient with some cueing and support removed tape border tabs, will need further support with this at home Patient is VERY impulsive in her care. She will have [place] for support Patient was able to verbalize change schedule and emptying regimen. 8 flex convex pouches and 9 barrier rings in the room with pattern for DC to home   Enrolled patient in [place] Start Discharge program: Yes  [place] Nurse will follow along with you for continued support with ostomy teaching and care [place] MSN, [place], [place], [place], [place]

## 2020-03-27 NOTE — Progress Notes (Signed)
Palliative Care Progress Note  I met with Vanessa Beard and her daughter Vanessa Beard to further discuss goals of care after there was confusion about hospice services being offered and treatment options following surgical diverting colostomy. Vanessa Beard and her daughter have questions about the state of her cancer and prognosis and what the plan is for treatment moving forward. Dr. Feng saw them just prior to my visit and plans to follow up with her in the clinic in two weeks.  Vanessa Beard is somewhat discouraged today because she is still having quite a bit of pain and discomfort especially in the area of her anal sphincter where there continues to be an uncomfortable and painful mass. She describes having irritation and discomfort specifically related to this semi-external area. She has been using a barrier cream and topical anesthetics. I am concerned that when she becomes more mobile this area will become more inflamed and become ulcerated again. She has questions for the surgical team about what options exist for removing or treating this mass. I provided education around why the ostomy was placed- to divert painful bowel movements and to also prevent future obstruction.   I confirmed that her goals of care are to have the best quality of life as possible and helped her set reasonable goals like being able to function independently at home and pain relief vs. things like going back to work and feeling completely well again. She is not opposed to additional cancer treatment if this is something oncology thinks will help control tumor growth and give her length of life. Previously her pain was so bad that she couldn't even contemplate treatment. Despite ongoing pain in her anus and rectum it is at least somewhat improved than prior to surgery. She appears clinically better as well. I provided encouragement to patient and her daughter that with a bit more time and surgical recovery I anticipate further pain  reduction.  Recommendations:  1. At this time her goals are not aligning with hospice care as she is contemplating more treatment and will follow-up with Dr. Feng in two weeks. We discussed hospice care as not just care provided in the last few days of life and not just care that has a goal of dying but rather assistance living well when faced with a terminal illness when all other treatment options have been exhausted or electively discontinued. I explained that her illness is not curable.  2. Recommend Palliative Care Following at home along with Home Health PT- another possible option could be a short stay in inpatient rehab to improve her deconditioning and to further work on her pain control with the physiatrists to help with her functional status.  3. Added Cetacaine QID instead of PRN, continue barrier cream - will order gearheardts paste.  4. Maintain current Opioids.  5. Recommend contacting surgery service again to discuss patients complaints regarding the mass located on her anal verge and possible interventions to remove this or treat this for her comfort. 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   , DO Palliative Medicine [phone]  

## 2020-03-27 NOTE — Progress Notes (Addendum)
8 Days Post-Op    CC: Rectal pain  Subjective:  She's having continued rectal pain, but does not seem as bad as yesterday.  She anticipates going home this afternoon.  Objective: Vital signs in last 24 hours: Temp:  [98 F (36.7 C)-98.6 F (37 C)] 98.4 F (36.9 C) (11/09 0816) Pulse Rate:  [72-88] 75 (11/09 0816) Resp:  [17-19] 19 (11/09 0816) BP: (94-105)/(67-79) 98/75 (11/09 0816) SpO2:  [93 %-100 %] 100 % (11/09 0816) Last BM Date: 03/26/20 720 PO Urine 250 Stool 400 Afebrile, VSS H/H 7.2/23  Intake/Output from previous day: 11/08 0701 - 11/09 0700 In: 450 [P.O.:450] Out: 850 [Urine:550; Stool:300] Intake/Output this shift: No intake/output data recorded.  General appearance: alert, cooperative and no distress Resp: clear to auscultation bilaterally GI: soft, sore, ostomy looks good, working well.  Abdomen is soft and not distended  Lab Results:  No results for input(s): WBC, HGB, HCT, PLT in the last 72 hours.  BMET Recent Labs    03/26/20 0508  CREATININE 0.58   PT/INR No results for input(s): LABPROT, INR in the last 72 hours.  No results for input(s): AST, ALT, ALKPHOS, BILITOT, PROT, ALBUMIN in the last 168 hours.   Lipase     Component Value Date/Time   LIPASE 25 11/02/2019 0030     Medications: . abacavir-dolutegravir-lamiVUDine  1 tablet Oral Daily  . acetaminophen  1,000 mg Oral Q8H  . butamben-tetracaine-benzocaine  1 spray Topical QID  . Chlorhexidine Gluconate Cloth  6 each Topical Daily  . DULoxetine  20 mg Oral Daily  . enoxaparin (LOVENOX) injection  40 mg Subcutaneous Daily  . feeding supplement  237 mL Oral BID BM  . fentaNYL  1 patch Transdermal Q72H  . gabapentin  600 mg Oral BID  . Gerhardt's butt cream   Topical QID  . lactose free nutrition  237 mL Oral TID WC  . liver oil-zinc oxide   Topical BID  . senna-docusate  1 tablet Oral BID  . 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 Marcello Moores - 03/19/2020  - WOCN following for colostomy teaching   - [place]- PT recommending SNF (apparently SNF was denied by insurance)  -Tolerating regular diet and having bowel function  - Primary issue is still rectal pain.  FEN -Reg diet. Ensure VTE -SCDs,Lovenox ID -cefotetan periop Foley - d/c'd 11/2. Plan - Seen by Dr. Hilma Favors for palliative care.  There are no plans for surgery for her rectal mass.   But she has follow up with Dr. Marcello Moores on 04/09/2020.  She is planning on going home this afternoon.     LOS: 15 days    Shann Medal 03/27/2020 Please see Amion

## 2020-03-28 ENCOUNTER — Other Ambulatory Visit: Payer: Self-pay

## 2020-03-28 ENCOUNTER — Telehealth: Payer: Self-pay

## 2020-03-28 DIAGNOSIS — R52 Pain, unspecified: Secondary | ICD-10-CM

## 2020-03-28 NOTE — Telephone Encounter (Signed)
I spoke with Vanessa Beard.  I reviewed her upcoming appts at the [facility]. She verbalized understanding. She told me the fentanyl patch wasn't working.  When I said that she was on a pretty high dose she then said "oh no i forgot I threw them away by accident and I am in pain".  Dr Hilma Favors notified.

## 2020-03-29 ENCOUNTER — Encounter: Payer: Self-pay | Admitting: *Deleted

## 2020-03-29 ENCOUNTER — Other Ambulatory Visit: Payer: Self-pay | Admitting: *Deleted

## 2020-03-29 ENCOUNTER — Telehealth: Payer: Self-pay | Admitting: *Deleted

## 2020-03-29 NOTE — Telephone Encounter (Signed)
Pt left vm today stating that she found her fentanyl patches. Notified Dr Burr Medico.

## 2020-03-29 NOTE — Patient Outreach (Signed)
[facility]) Care Management  03/29/2020  Vanessa Beard 12-Oct-1950 [id number]   Telephone Assessment Transition of care to be completed by the provider  RN was referred for resources for heating her home. Initially ot was denies for SNF however arranged able to be approved for [facility] for RN and therapy and [facility] (Palliative).  RN spoke with pt today and introduced [facility] services and further inquired on her needs. Pt states she only needed assistance with heating her home however pt is currently living with her sister for her [facility] home visits. No immediate needs however pt continues to need assistance when she returns home. RN inquired on any other needs related to [facility] services however pt denied.   Based upon today's assistance will make a referral for social worker and update provider on pt's disposition with [facility] services.  Raina Mina, RN Care Management Coordinator [place] Office [phone]

## 2020-03-29 NOTE — Patient Outreach (Signed)
[place] [facility]) Care Management  03/29/2020  ALIEYAH SPADER 1950/09/05 [id number]   CSW made an initial attempt to try and contact patient today to perform the initial phone assessment, as well as assess and assist with social work needs and services, without success.  A HIPAA compliant message was left for patient on voicemail.  CSW is currently awaiting a return call.  CSW will make a second outreach attempt within the next 3-4 business days, if a return call is not received from patient in the meantime.  CSW will also mail a Patient Unsuccessful Outreach Letter to patient's home, requesting that she contact CSW if she is interested in receiving social work services through [place] with [facility].  [name], BSW, MSW, LCSW  Licensed Education officer, environmental [facility]  Mailing [place] [address], [place], [place] [address] Physical Address-[address] [place],  [address] Toll Free Main # [phone] Fax # [phone] Cell # [phone]  [name]

## 2020-03-30 ENCOUNTER — Ambulatory Visit: Payer: Self-pay | Admitting: *Deleted

## 2020-04-01 ENCOUNTER — Other Ambulatory Visit: Payer: Self-pay | Admitting: Physician Assistant

## 2020-04-01 DIAGNOSIS — K6289 Other specified diseases of anus and rectum: Secondary | ICD-10-CM

## 2020-04-01 DIAGNOSIS — C210 Malignant neoplasm of anus, unspecified: Secondary | ICD-10-CM

## 2020-04-03 ENCOUNTER — Telehealth: Payer: Self-pay | Admitting: *Deleted

## 2020-04-03 MED ORDER — HYDROMORPHONE HCL 4 MG PO TABS: 4.0000 mg | ORAL_TABLET | Freq: Four times a day (QID) | ORAL | 0 refills | Status: DC | PRN

## 2020-04-03 NOTE — Telephone Encounter (Signed)
Hydromorphone 4mg  tablets refilled #60, she was given 15 days at time of discharge. I will see patient on Friday. Will recommend ongoing palliative care support and if pain continues a hospice transition may be appropriate,  Lane Hacker, DO Palliative Medicine

## 2020-04-03 NOTE — Telephone Encounter (Signed)
Received vm from pt crying & stating she is in a lot of pain, rectum hurts, "hurts so bad".  She has been out of dilaudid since Sat.  She is requesting refills on her pain med.  Message to Dr Hilma Favors & Dr Burr Medico.

## 2020-04-04 ENCOUNTER — Other Ambulatory Visit: Payer: Self-pay | Admitting: *Deleted

## 2020-04-04 NOTE — Patient Outreach (Signed)
[place] [facility]) Care Management  04/04/2020  Vanessa Beard 02/28/1951 [id number]   CSW made a second attempt to try and contact patient today to perform the initial phone assessment, as well as to assess and assist with social work needs and services, but without success.  CSW was able to leave a HIPAA compliant message for patient on her mobile number but not on her home phone.  CSW will make a third outreach attempt within the next 3-4 business days, if a return all is not received from patient in the meantime.  [name], BSW, MSW, LCSW  Licensed Education officer, environmental [facility] System  Mailing [place] [address] [place], [place] [address] Physical Address-[address], [place], [place] [address] Toll Free Main # [phone] Fax # [phone] Cell # [phone]  [name]

## 2020-04-04 NOTE — Progress Notes (Signed)
Vanessa Beard   [phone] [phone]   Clinic Follow up Note   Patient Care Team: Seward Carol, MD as PCP - General (Internal Medicine) Carlyle Basques, MD as PCP - Infectious Diseases (Infectious Diseases) Binnie Rail, DC as Referring Physician (Chiropractic Medicine) Jonnie Finner, RN as Oncology Nurse Navigator [place], Maree Erie, LCSW as [facility]  Date of Service:  04/06/2020  CHIEF COMPLAINT: F/u ofanal cancer  SUMMARY OF ONCOLOGIC HISTORY: Oncology History Overview Note  Cancer Staging Anal cancer ([place]) Staging form: Anus, AJCC 8th Edition - Clinical: Stage IV (cT2, cN0, cM1) - Signed by Truitt Merle, MD on 08/12/2019    Anal cancer ([place])  07/19/2019 Procedure   Colonoscopy by Dr Havery Moros 07/19/19  IMPRESSION - Ulcer noted at the anal canal in posterior midline canal that extends into the distal rectum on digital rectal exam, nodular and somewhat hard to palpation. Endoscopic images show nodular tissue in the area, concerning for malignant ulcer. - The examined portion of the ileum was normal. - One 3 mm polyp at the hepatic flexure, removed with a cold snare. Resected and retrieved. - One 5 mm polyp in the transverse colon, removed with a cold snare. Resected and retrieved. - Diverticulosis in the ascending colon. - The examination was otherwise normal.   07/19/2019 Initial Biopsy   Diagnosis 07/19/19 1. Transverse Colon Polyp, hepatic flexure (2) - TUBULAR ADENOMA (1 OF 3 FRAGMENTS) - BENIGN COLONIC MUCOSA (2 OF 3 FRAGMENTS) - NO HIGH GRADE DYSPLASIA OR MALIGNANCY IDENTIFIED 2. Rectum, biopsy, distal rectal anal canal - SQUAMOUS CELL CARCINOMA - SEE COMMENT   07/26/2019 Initial Diagnosis   Anal cancer ([place])   07/26/2019 Cancer Staging   Staging form: Anus, AJCC 8th Edition - Clinical: Stage IV (cT2, cN1c, cM1) - Signed by Truitt Merle, MD on 08/12/2019   08/04/2019 PET scan    IMPRESSION: Hypermetabolic anal soft tissue mass, consistent with known primary anal carcinoma.   Sub-cm hypermetabolic lymph nodes in posterior perirectal space, right inguinal region, right iliac chain, and gastrohepatic ligament, suspicious for metastatic disease.   Small hypermetabolic left axillary lymph nodes. This would be unusual location for metastatic anal carcinoma. Recommend clinical correlation for possibility the patient has had recent COVID vaccination in the left arm, which could explain this finding.   Multifocal airspace disease in both lower lungs with marked hypermetabolic activity. This favors infectious or inflammatory etiology, and is not typical for pulmonary metastases. Short-term follow-up by chest CT is recommended.   08/15/2019 - 10/05/2019 Radiation Therapy   Concurrent ChemoRT with Dr. Lisbeth Renshaw starting 08/15/19-10/05/19   08/15/2019 - 09/12/2019 Chemotherapy   Concurrent ChemoRT with Mitomycin and 5FU on week 1 and 5 starting 08/15/19 with week 5 dose on 09/12/19.    11/02/2019 Imaging   CT CAP w contrast  IMPRESSION: No acute findings in the chest, abdomen or pelvis.   Biapical and bibasilar scarring. Bronchiectasis and paraseptal emphysema in the lung bases.   Tortuous aorta.   Small umbilical hernia containing fat.   01/05/2020 Imaging   PET SCAN IMPRESSION: 1. Partial metabolic response. Persistent anal mass hypermetabolism, decreased. No residual hypermetabolic nodal metastases in the abdomen or pelvis. Left axillary hypermetabolic lymph nodes are decreased in size and metabolism. No new or progressive hypermetabolic metastatic disease. 2. Hypermetabolism associated with patchy consolidative airspace opacities in the mid to lower lungs bilaterally, decreased in extent and metabolism, favoring resolving inflammatory opacities. 3. Aortic Atherosclerosis (ICD10-I70.0) and Emphysema (ICD10-J43.9).  01/24/2020 Pathology Results   FINAL MICROSCOPIC  DIAGNOSIS:   A. LYMPH NODE, LEFT AXILLARY, BIOPSY:  - Lymph node with nonnecrotizing granulomas  - No evidence of malignancy  - See comment     COMMENT:   AFB and GMS special stains to rule out acid-fast bacilli and fungal  organisms are pending and will be reported in an addendum.    01/27/2020 - 02/17/2020 Antibody Plan   Keytruda every 3 weeks starting 01/27/20. Stopped after 2 cycles due to disease progression.    03/01/2020 Imaging   CT AP  IMPRESSION: 1. There is an irregular, rim enhancing soft tissue mass about the anus, containing air and fluid, measuring overall approximately 5.4 x 2.4 cm. Findings most suggestive of necrotic anorectal mass. Correlate with physical exam findings. 2. Unchanged fat stranding of the perirectal and presacral soft tissues, in keeping with prior radiation therapy. 3. No evidence of lymphadenopathy or metastatic disease in the abdomen or pelvis. 4. Aortic Atherosclerosis (ICD10-I70.0).   03/09/2020 Procedure   PAC placement with Dr Reece Agar   03/13/2020 Imaging   CT AP  IMPRESSION: 1. 2.6 x 1.3 cm region of apparent focal posterior bladder wall thickening is indeterminate as this finding was not visible on the scan from 12 days ago. Although today, it has an appearance suspicious for urothelial neoplasm, given that it was not visible previously, layering debris/blood products is the probable etiology. Nevertheless, close follow-up recommended to ensure that it resolves. 2. No substantial change in the patient's known anal rectal mass. No evidence for metastatic disease in the abdomen or pelvis. 3. Soft tissue gas in the pelvic floor compatible with recent CT-guided bilateral pudendal nerve block. 4. Bronchiectasis with parenchymal scarring in the lung bases bilaterally. 5. Aortic Atherosclerosis (ICD10-I70.0).     03/19/2020 Surgery   LAPAROSCOPIC Ostomy Placement by Dr Marcello Moores      CURRENT THERAPY:  Supportive  care  INTERVAL HISTORY:  Vanessa Beard is here for a follow up. She presents to the clinic with family. She notes she still is in a lot of uncontrolled pain. When she left the hospital her family was not showed how to manage her colostomy bag. Her family notes their are her care givers and she does very little for herself. She has rectal discharge and plan to take antibiotics. Her family notes with rectal discharge will look brown as well.     REVIEW OF SYSTEMS:   Constitutional: Denies fevers, chills or abnormal weight loss Eyes: Denies blurriness of vision Ears, nose, mouth, throat, and face: Denies mucositis or sore throat Respiratory: Denies cough, dyspnea or wheezes Cardiovascular: Denies palpitation, chest discomfort or lower extremity swelling Gastrointestinal:  Denies nausea, heartburn or change in bowel habits (+) Rectal discharge with blood (+) Severe rectal pain  Skin: Denies abnormal skin rashes Lymphatics: Denies new lymphadenopathy or easy bruising Neurological:Denies numbness, tingling or new weaknesses Behavioral/Psych: Mood is stable, no new changes  All other systems were reviewed with the patient and are negative.  MEDICAL HISTORY:  Past Medical History:  Diagnosis Date  . Allergic rhinitis   . Biliary colic   . Cancer ([place])   . Gall bladder disease \  . Hepatitis C   . Herpes   . HIV infection ([place])   . Hyperparathyroidism ([place])   . Hypertension   . Insomnia   . Osteoporosis   . Pneumonia    2010    SURGICAL HISTORY: Past Surgical History:  Procedure Laterality Date  . CHOLECYSTECTOMY N/A  01/09/2015   Procedure: LAPAROSCOPIC CHOLECYSTECTOMY;  Surgeon: [name], MD;  Location: [facility];  Service: General;  Laterality: N/A;  . ECTOPIC PREGNANCY SURGERY    . ESOPHAGOGASTRODUODENOSCOPY (EGD) WITH PROPOFOL N/A 09/08/2019   Procedure: ESOPHAGOGASTRODUODENOSCOPY (EGD) WITH PROPOFOL;  Surgeon: Milus Banister, MD;  Location: [facility];  Service:  Endoscopy;  Laterality: N/A;  . IR FLUORO GUIDE CV LINE LEFT  08/15/2019  . LAPAROSCOPIC LOOP COLOSTOMY N/A 03/19/2020   Procedure: LAPAROSCOPIC Ostomy Placement;  Surgeon: Leighton Ruff, MD;  Location: [facility];  Service: General;  Laterality: N/A;  . ORIF TIBIA PLATEAU Right 02/18/2017   Procedure: OPEN REDUCTION INTERNAL FIXATION (ORIF) RIGHT TIBIAL PLATEAU;  Surgeon: Leandrew Koyanagi, MD;  Location: [facility];  Service: Orthopedics;  Laterality: Right;  . PORTACATH PLACEMENT Right 03/09/2020   Procedure: INSERTION PORT-A-CATH;  Surgeon: Kinsinger, Arta Bruce, MD;  Location: [facility];  Service: General;  Laterality: Right;  . SHOULDER SURGERY Right 12/2014  . UPPER ESOPHAGEAL ENDOSCOPIC ULTRASOUND (EUS) N/A 09/08/2019   Procedure: UPPER ESOPHAGEAL ENDOSCOPIC ULTRASOUND (EUS);  Surgeon: Milus Banister, MD;  Location: [facility];  Service: Endoscopy;  Laterality: N/A;    I have reviewed the social history and family history with the patient and they are unchanged from previous note.  ALLERGIES:  is allergic to dapsone, retrovir [zidovudine], and sulfamethoxazole-trimethoprim.  MEDICATIONS:  Current Outpatient Medications  Medication Sig Dispense Refill  . acetaminophen (TYLENOL) 500 MG tablet Take 1 tablet (500 mg total) by mouth every 6 (six) hours as needed for moderate pain. 30 tablet 0  . belladonna-opium (B&O SUPPRETTES) 16.2-30 MG suppository Place 1 suppository rectally every 6 (six) hours as needed for pain. 12 suppository 0  . butamben-tetracaine-benzocaine (CETACAINE) 06-20-12 % spray Apply 1 spray topically as needed (for Bowel movement pain or perineal pain). 56 g 0  . cephALEXin (KEFLEX) 500 MG capsule Take 1 capsule (500 mg total) by mouth 3 (three) times daily. 30 capsule 1  . DULoxetine (CYMBALTA) 20 MG capsule TAKE 1 CAPSULE(20 MG) BY MOUTH DAILY 30 capsule 2  . feeding supplement (ENSURE ENLIVE / ENSURE PLUS) LIQD Take 237 mLs by mouth 2 (two) times daily between meals. 14220 mL 0  .  fentaNYL (DURAGESIC) 100 MCG/HR Place 1 patch onto the skin every 3 (three) days. 10 patch 0  . HYDROmorphone (DILAUDID) 4 MG tablet Take 1-2 tablets (4-8 mg total) by mouth every 6 (six) hours as needed for severe pain. 90 tablet 0  . lactose free nutrition (BOOST PLUS) LIQD Take 237 mLs by mouth 3 (three) times daily with meals. 21330 mL 0  . mirtazapine (REMERON) 15 MG tablet Take 1 tablet (15 mg total) by mouth at bedtime. 30 tablet 0  . polyethylene glycol (MIRALAX / GLYCOLAX) 17 g packet Take 17 g by mouth daily. 28 each 0  . senna-docusate (SENOKOT-S) 8.6-50 MG tablet Take 1 tablet by mouth 2 (two) times daily. 60 tablet 1  . simethicone (MYLICON) 80 MG chewable tablet Chew 1 tablet (80 mg total) by mouth every 6 (six) hours as needed for flatulence. 30 tablet 0  . sucralfate (CARAFATE) 1 g tablet TAKE 2 TABLETS AND MIX IN 4.5ML OF WATER AND PLACE IN ENEMA APPLICATOR USE TWICE DAILY (Patient taking differently: Take 2 g by mouth daily. ) 180 tablet 0  . TRIUMEQ 600-50-300 MG tablet TAKE 1 TABLET BY MOUTH DAILY (Patient taking differently: Take 1 tablet by mouth daily. ) 30 tablet 3   No current facility-administered medications  for this visit.    PHYSICAL EXAMINATION: ECOG PERFORMANCE STATUS: 4 - Bedbound  Vitals:   04/06/20 1228  BP: (!) 89/74  Pulse: (!) 126  Resp: 20  Temp: 97.8 F (36.6 C)  SpO2: 99%   Filed Weights    Due to COVID19 we will limit examination to appearance. Patient had no complaints.  GENERAL:alert, no distress  SKIN: skin color normal, no rashes or significant lesions EYES: normal, Conjunctiva are pink and non-injected, sclera clear  NEURO: alert & oriented x 3 with fluent speech   LABORATORY DATA:  I have reviewed the data as listed CBC Latest Ref Rng & Units 04/06/2020 03/27/2020 03/22/2020  WBC 4.0 - 10.5 K/uL 8.6 - -  Hemoglobin 12.0 - 15.0 g/dL 8.9(L) 7.8(L) 7.2(L)  Hematocrit 36 - 46 % 28.6(L) 24.5(L) 23.0(L)  Platelets 150 - 400 K/uL 245 - -      CMP Latest Ref Rng & Units 04/06/2020 03/26/2020 03/21/2020  Glucose 70 - 99 mg/dL 146(H) - 106(H)  BUN 8 - 23 mg/dL 13 - 16  Creatinine 0.44 - 1.00 mg/dL 0.71 0.58 0.65  Sodium 135 - 145 mmol/L 137 - 136  Potassium 3.5 - 5.1 mmol/L 3.8 - 4.6  Chloride 98 - 111 mmol/L 103 - 105  CO2 22 - 32 mmol/L 24 - 26  Calcium 8.9 - 10.3 mg/dL 10.3 - 8.9  Total Protein 6.5 - 8.1 g/dL 6.7 - -  Total Bilirubin 0.3 - 1.2 mg/dL 0.6 - -  Alkaline Phos 38 - 126 U/L 99 - -  AST 15 - 41 U/L 14(L) - -  ALT 0 - 44 U/L <6 - -      RADIOGRAPHIC STUDIES: I have personally reviewed the radiological images as listed and agreed with the findings in the report. No results found.   ASSESSMENT & PLAN:  Vanessa Beard is a 69 y.o. 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 Her biopsy showed Squamous Cell Carcinoma, consistent with anal cancer. PET scan was suspicious for distant node metastasis  -She completed concurrent chemoradiation 07/2019-09/2019.  -she unfortunately had residual disease after chemoRT and progressed on immunotherapy  -She underwent ostomy placement on 03/12/20. She continues to have rectal discharge and severe rectal pain from her cancer.  -Given her poor performance status and uncontrolled pain she is not eligible for more chemotherapy. I recommend hospice. She had long conversation with Dr. Hilma Favors today about [facility]. She and family have agreed and will proceed.  -F/u open   2. Symptoms Management: Anal Pain, Low appetite, Weight loss  -f/u with Dr. Hilma Favors from palliative medicine. On methadone, oxycodone, cymbalta, Neurontin   -For appetite she is will start Mirtazapine.   3. Comorbidities: Hep C, HIV, HTN, Hyperparathyroidism, Osteoporosis -She underwentsuccessfulHep C treatment 2  years ago ([id number]) -Her HIV was contracted through sex. Her HIV is well controlled. She will continue to f/u ID Dr Graylon Good.Last CD4 count was 386 on 02/07/2019.    PLAN: -Proceed with Hospice home care. Dr. Hilma Favors will make the referral  -F/u as needed    No problem-specific Assessment & Plan notes found for this encounter.   No orders of the defined types were placed in this encounter.  All questions were answered. The patient knows to call the clinic with any problems, questions or concerns. No barriers to learning  was detected. The total time spent in the appointment was 20 minutes.     Truitt Merle, MD 04/06/2020   I, Joslyn Devon, am acting as scribe for Truitt Merle, MD.   I have reviewed the above documentation for accuracy and completeness, and I agree with the above.

## 2020-04-06 ENCOUNTER — Inpatient Hospital Stay: Payer: Medicare Other | Attending: Hematology | Admitting: Hematology

## 2020-04-06 ENCOUNTER — Inpatient Hospital Stay (HOSPITAL_BASED_OUTPATIENT_CLINIC_OR_DEPARTMENT_OTHER): Payer: Medicare Other | Admitting: Internal Medicine

## 2020-04-06 ENCOUNTER — Other Ambulatory Visit: Payer: Self-pay

## 2020-04-06 ENCOUNTER — Inpatient Hospital Stay: Payer: Medicare Other

## 2020-04-06 VITALS — BP 89/74 | HR 126 | Temp 97.8°F | Resp 20 | Ht 60.0 in

## 2020-04-06 DIAGNOSIS — M810 Age-related osteoporosis without current pathological fracture: Secondary | ICD-10-CM | POA: Insufficient documentation

## 2020-04-06 DIAGNOSIS — C210 Malignant neoplasm of anus, unspecified: Secondary | ICD-10-CM | POA: Diagnosis not present

## 2020-04-06 DIAGNOSIS — Z21 Asymptomatic human immunodeficiency virus [HIV] infection status: Secondary | ICD-10-CM | POA: Insufficient documentation

## 2020-04-06 DIAGNOSIS — Z923 Personal history of irradiation: Secondary | ICD-10-CM | POA: Insufficient documentation

## 2020-04-06 DIAGNOSIS — Z79899 Other long term (current) drug therapy: Secondary | ICD-10-CM | POA: Diagnosis not present

## 2020-04-06 DIAGNOSIS — G893 Neoplasm related pain (acute) (chronic): Secondary | ICD-10-CM | POA: Diagnosis not present

## 2020-04-06 DIAGNOSIS — K429 Umbilical hernia without obstruction or gangrene: Secondary | ICD-10-CM | POA: Diagnosis not present

## 2020-04-06 DIAGNOSIS — Z8759 Personal history of other complications of pregnancy, childbirth and the puerperium: Secondary | ICD-10-CM | POA: Diagnosis not present

## 2020-04-06 DIAGNOSIS — C211 Malignant neoplasm of anal canal: Secondary | ICD-10-CM | POA: Insufficient documentation

## 2020-04-06 DIAGNOSIS — J479 Bronchiectasis, uncomplicated: Secondary | ICD-10-CM | POA: Diagnosis not present

## 2020-04-06 DIAGNOSIS — D123 Benign neoplasm of transverse colon: Secondary | ICD-10-CM | POA: Insufficient documentation

## 2020-04-06 DIAGNOSIS — I700 Atherosclerosis of aorta: Secondary | ICD-10-CM | POA: Insufficient documentation

## 2020-04-06 DIAGNOSIS — Z9049 Acquired absence of other specified parts of digestive tract: Secondary | ICD-10-CM | POA: Diagnosis not present

## 2020-04-06 DIAGNOSIS — E213 Hyperparathyroidism, unspecified: Secondary | ICD-10-CM | POA: Insufficient documentation

## 2020-04-06 DIAGNOSIS — Z515 Encounter for palliative care: Secondary | ICD-10-CM

## 2020-04-06 DIAGNOSIS — C778 Secondary and unspecified malignant neoplasm of lymph nodes of multiple regions: Secondary | ICD-10-CM | POA: Diagnosis not present

## 2020-04-06 DIAGNOSIS — Z1329 Encounter for screening for other suspected endocrine disorder: Secondary | ICD-10-CM

## 2020-04-06 DIAGNOSIS — B1920 Unspecified viral hepatitis C without hepatic coma: Secondary | ICD-10-CM | POA: Insufficient documentation

## 2020-04-06 DIAGNOSIS — Z881 Allergy status to other antibiotic agents status: Secondary | ICD-10-CM | POA: Diagnosis not present

## 2020-04-06 DIAGNOSIS — R52 Pain, unspecified: Secondary | ICD-10-CM

## 2020-04-06 DIAGNOSIS — J439 Emphysema, unspecified: Secondary | ICD-10-CM | POA: Insufficient documentation

## 2020-04-06 DIAGNOSIS — I10 Essential (primary) hypertension: Secondary | ICD-10-CM | POA: Insufficient documentation

## 2020-04-06 DIAGNOSIS — K6289 Other specified diseases of anus and rectum: Secondary | ICD-10-CM | POA: Diagnosis not present

## 2020-04-06 LAB — CBC WITH DIFFERENTIAL (CANCER CENTER ONLY)
Abs Immature Granulocytes: 0.04 10*3/uL (ref 0.00–0.07)
Basophils Absolute: 0 10*3/uL (ref 0.0–0.1)
Basophils Relative: 0 %
Eosinophils Absolute: 0.1 10*3/uL (ref 0.0–0.5)
Eosinophils Relative: 1 %
HCT: 28.6 % — ABNORMAL LOW (ref 36.0–46.0)
Hemoglobin: 8.9 g/dL — ABNORMAL LOW (ref 12.0–15.0)
Immature Granulocytes: 1 %
Lymphocytes Relative: 11 %
Lymphs Abs: 1 10*3/uL (ref 0.7–4.0)
MCH: 31.3 pg (ref 26.0–34.0)
MCHC: 31.1 g/dL (ref 30.0–36.0)
MCV: 100.7 fL — ABNORMAL HIGH (ref 80.0–100.0)
Monocytes Absolute: 0.5 10*3/uL (ref 0.1–1.0)
Monocytes Relative: 6 %
Neutro Abs: 6.9 10*3/uL (ref 1.7–7.7)
Neutrophils Relative %: 81 %
Platelet Count: 245 10*3/uL (ref 150–400)
RBC: 2.84 MIL/uL — ABNORMAL LOW (ref 3.87–5.11)
RDW: 16 % — ABNORMAL HIGH (ref 11.5–15.5)
WBC Count: 8.6 10*3/uL (ref 4.0–10.5)
nRBC: 0 % (ref 0.0–0.2)

## 2020-04-06 LAB — CMP (CANCER CENTER ONLY)
ALT: <6 U/L (ref 0–44)
AST: 14 U/L — ABNORMAL LOW (ref 15–41)
Albumin: 2.2 g/dL — ABNORMAL LOW (ref 3.5–5.0)
Alkaline Phosphatase: 99 U/L (ref 38–126)
Anion gap: 10 (ref 5–15)
BUN: 13 mg/dL (ref 8–23)
CO2: 24 mmol/L (ref 22–32)
Calcium: 10.3 mg/dL (ref 8.9–10.3)
Chloride: 103 mmol/L (ref 98–111)
Creatinine: 0.71 mg/dL (ref 0.44–1.00)
GFR, Estimated: >60 mL/min (ref >60)
Glucose, Bld: 146 mg/dL — ABNORMAL HIGH (ref 70–99)
Potassium: 3.8 mmol/L (ref 3.5–5.1)
Sodium: 137 mmol/L (ref 135–145)
Total Bilirubin: 0.6 mg/dL (ref 0.3–1.2)
Total Protein: 6.7 g/dL (ref 6.5–8.1)

## 2020-04-06 LAB — TSH: TSH: 2.058 u[IU]/mL (ref 0.308–3.960)

## 2020-04-06 MED ORDER — HYDROMORPHONE HCL 4 MG PO TABS
4.0000 mg | ORAL_TABLET | Freq: Four times a day (QID) | ORAL | 0 refills | Status: DC | PRN
Start: 2020-04-06 — End: 2020-04-06

## 2020-04-06 MED ORDER — CEPHALEXIN 500 MG PO CAPS: 500.0000 mg | ORAL_CAPSULE | Freq: Three times a day (TID) | ORAL | 1 refills | Status: DC

## 2020-04-06 MED ORDER — MIRTAZAPINE 15 MG PO TABS: 15.0000 mg | ORAL_TABLET | Freq: Every day | ORAL | 0 refills | Status: DC

## 2020-04-06 MED ORDER — FENTANYL 100 MCG/HR TD PT72
1.0000 | MEDICATED_PATCH | TRANSDERMAL | 0 refills | Status: DC
Start: 2020-04-06 — End: 2020-04-06

## 2020-04-06 MED FILL — FENTANYL 100 MCG/HR PT72: 100 | 30 days supply | Qty: 10 | Fill #0

## 2020-04-06 MED FILL — CEPHALEXIN 500 MG CAPSULE: 500 | 10 days supply | Qty: 30 | Fill #0

## 2020-04-06 MED FILL — HYDROmorphone HCL 4 MG TABS: 4 | 15 days supply | Qty: 90 | Fill #0

## 2020-04-06 MED FILL — MIRTAZAPINE 15 MG TABS: 15 | 30 days supply | Qty: 30 | Fill #0

## 2020-04-07 ENCOUNTER — Encounter: Payer: Self-pay | Admitting: Hematology

## 2020-04-09 ENCOUNTER — Telehealth: Payer: Self-pay | Admitting: Hematology

## 2020-04-09 NOTE — Telephone Encounter (Signed)
No 11/19 los. No changes made to pt's schedule.

## 2020-04-10 ENCOUNTER — Telehealth: Payer: Self-pay

## 2020-04-10 ENCOUNTER — Other Ambulatory Visit: Payer: Self-pay | Admitting: *Deleted

## 2020-04-10 NOTE — Patient Outreach (Signed)
[facility]) Care Management  04/10/2020  Vanessa Beard [birth year] [id number]   CSW made a third attempt to try and contact patient today to perform the initial phone assessment, as well as assess and assist with social work needs and services; however, Vanessa Beard was unavailable at the time of CSW's calls.  CSW tried calling patient at her home number, but the phone just continued to ring, with no answer.  CSW was unable to leave a HIPAA compliant message on voicemail.  CSW then attempted to try and contact patient on her mobile number, but received voicemail, leaving a HIPAA compliant message, encouraging Vanessa Beard to return CSW's call at her earliest convenience, if she is interested in receiving social work services through [place] with [facility].  Last, CSW tried calling patient's daughter, Vanessa Beard, but she too was unavailable at the time of CSW's call.  A HIPAA compliant message was left on voicemail for Vanessa Beard.  CSW will make a fourth and final outreach attempt within the next 4 weeks, if a return call is not received from patient in the meantime.  CSW noted that patient is currently residing with her granddaughter, Vanessa Beard and is receiving palliative care services through [facility].  [name], BSW, MSW, LCSW  Licensed Education officer, environmental [facility]  Mailing [place] [address] [place], [place] [address] Physical Address-[address], [place], [place] [address] Toll Free Main # [phone] Fax # [phone] Cell # [phone]  Vanessa Beard

## 2020-04-10 NOTE — Telephone Encounter (Signed)
Spoke with patient's daughter Ranisse-Matthew  and have scheduled an In-person Consult for 04/27/20 @ 8:30 AM.   COVID screening was negative. No pets in home. Patient lives with grand daughter.   Consent obtained; updated Outlook/Netsmart/Team List and Epic.

## 2020-04-27 ENCOUNTER — Other Ambulatory Visit: Payer: Self-pay

## 2020-04-27 ENCOUNTER — Other Ambulatory Visit: Payer: Medicare HMO | Admitting: Hospice

## 2020-04-27 DIAGNOSIS — Z515 Encounter for palliative care: Secondary | ICD-10-CM

## 2020-04-27 NOTE — Progress Notes (Signed)
About ten minutes into visit, patient informed NP that she is with [facility] since last week. She said she preferred [facility] palliative/hospice  hence her referral from PCP. NP explained she may not  have palliative service while on hospice care; also she will stay with [facility] hospice unless she revokes or she is discharged. On her request, she was give contact information for [facility] palliative. NP stopped the visit and left, patient resting in bed, in no distress, grand daughter at bedside.

## 2020-05-01 ENCOUNTER — Other Ambulatory Visit: Payer: Self-pay | Admitting: Physician Assistant

## 2020-05-01 DIAGNOSIS — K6289 Other specified diseases of anus and rectum: Secondary | ICD-10-CM

## 2020-05-07 ENCOUNTER — Encounter: Payer: Self-pay | Admitting: *Deleted

## 2020-05-07 ENCOUNTER — Other Ambulatory Visit: Payer: Self-pay | Admitting: *Deleted

## 2020-05-07 NOTE — Patient Outreach (Signed)
[facility]) Care Management  05/07/2020  Vanessa Beard Sep 02, 1950 [id number]   CSW will perform a case closure on patient, noting that patient is now participating in another community case management program, [place].  Patient has since terminated services with [facility] services.  CSW will notify patient's Primary Care Physician, Dr. Seward Carol of CSW's plans to close patient's case, in addition to sending a Physician Case Closure Letter.  CSW will also mail patient a Patient Case Closure Letter explaining the reason for closing her case.  [name], BSW, MSW, LCSW  Licensed Education officer, environmental Health System  Mailing [place] [address] [place], [place] [address] Physical Address-[address] [place], [place] [address] Toll Free Main # [phone] Fax # [phone] Cell # [phone]  [name]

## 2020-05-08 ENCOUNTER — Ambulatory Visit: Payer: Self-pay | Admitting: *Deleted

## 2020-07-17 DEATH — deceased

## 2020-08-10 IMAGING — CT CT CHEST W/ CM
2 of 5 series · 12 of 36 positions shown, 15 images · IV contrast (OMNIPAQUE 300)
Comparison: Abdominal ultrasound 06/02/2017
COMPARISON: PET CT 08/04/2019.
COMPARISON: Abdominal ultrasound 06/02/2017

Addendum:
CLINICAL DATA: Right upper quadrant pain. History of anal cancer
status post chemotherapy and radiation

EXAM:
CT CHEST, ABDOMEN, AND PELVIS WITH CONTRAST
TECHNIQUE: Multidetector CT imaging of the chest, abdomen and pelvis was
performed following the standard protocol during bolus
administration of intravenous contrast.
CONTRAST:  100mL OMNIPAQUE IOHEXOL 300 MG/ML  SOLN

[Series 1: cap with · axial · 0.69mm/px · z∈[+1061,+1511]mm · 9 of 112 slices shown, 12 images]
[im 11/112  mediastinal]
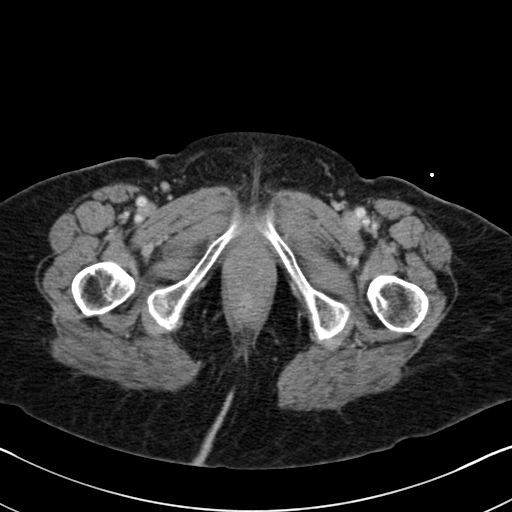
[im 11/112  lung]
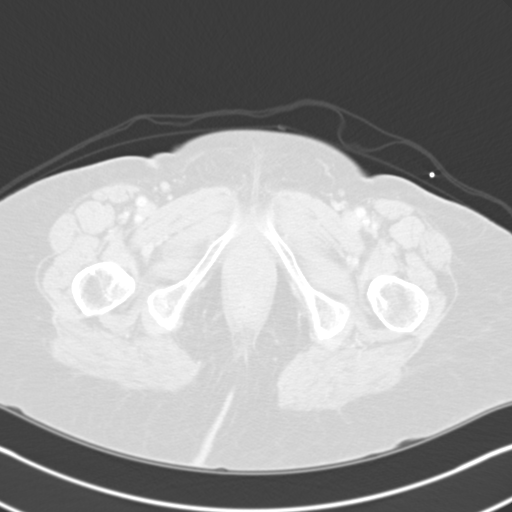
[im 21/112  lung]
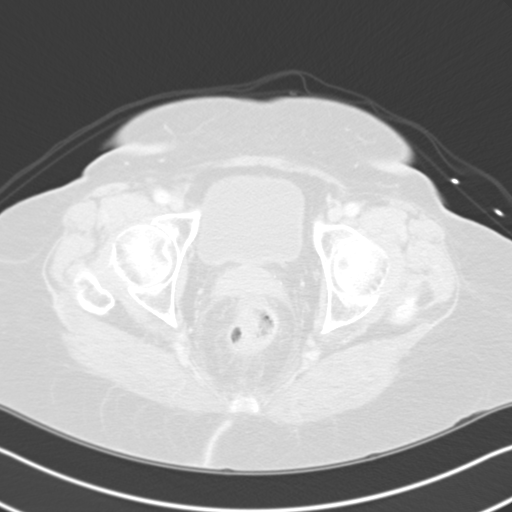
[im 31/112  lung]
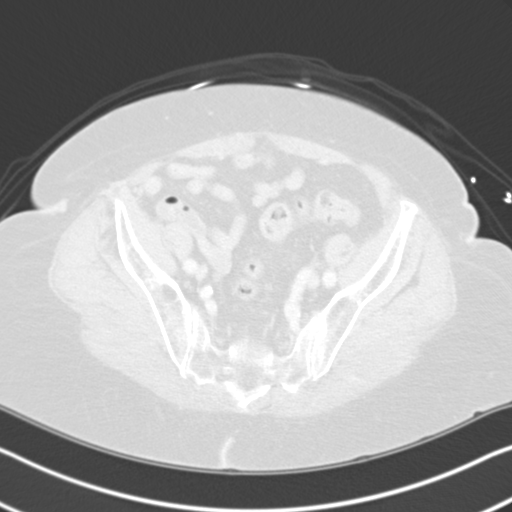
[im 41/112  lung]
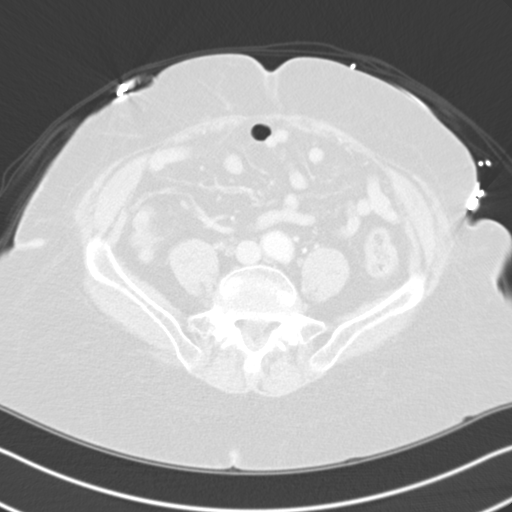
[im 61/112  mediastinal]
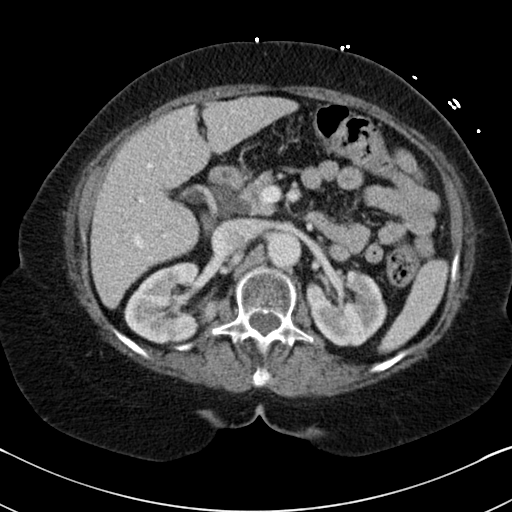
[im 61/112  lung]
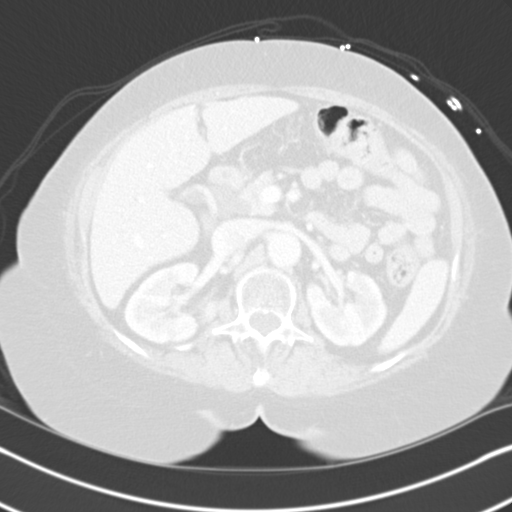
[im 71/112  lung]
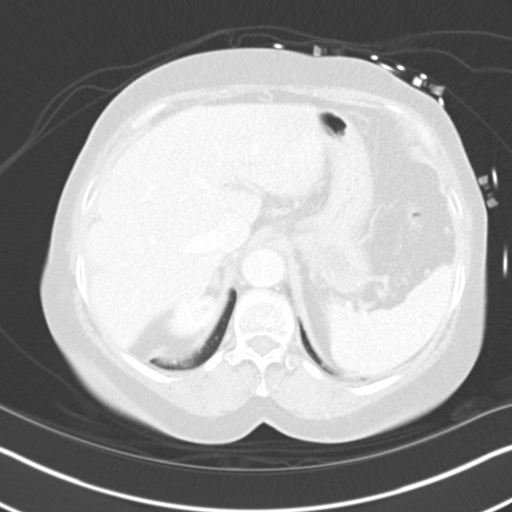
[im 81/112  lung]
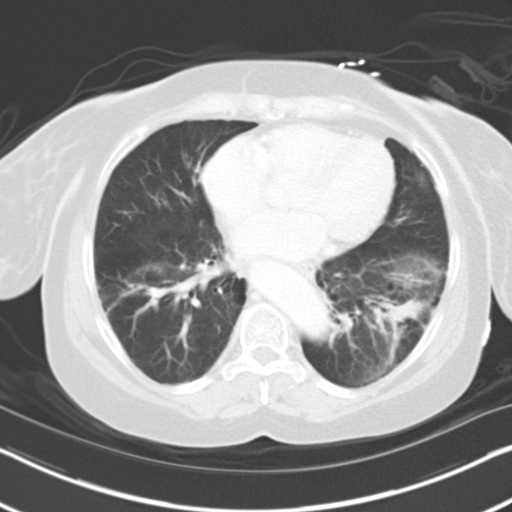
[im 91/112  lung]
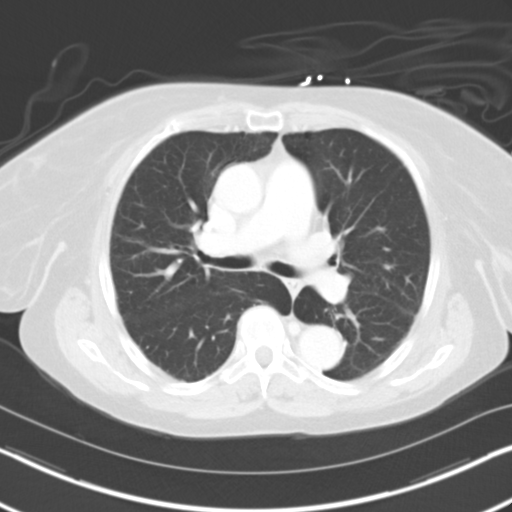
[im 101/112  mediastinal]
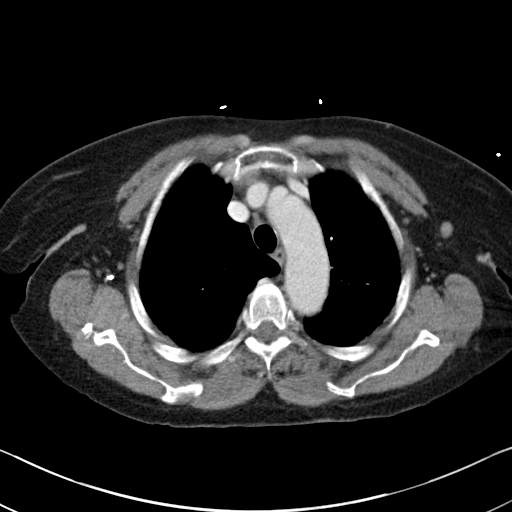
[im 101/112  lung]
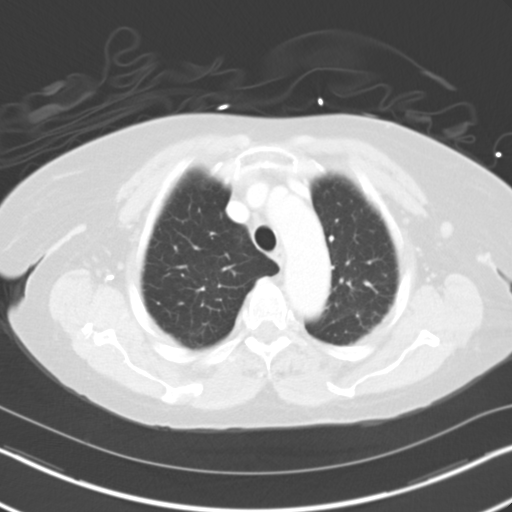

[Series 4: coronals · coronal · 0.71mm/px · 3 of 143 slices shown]
[im 29/143  lung]
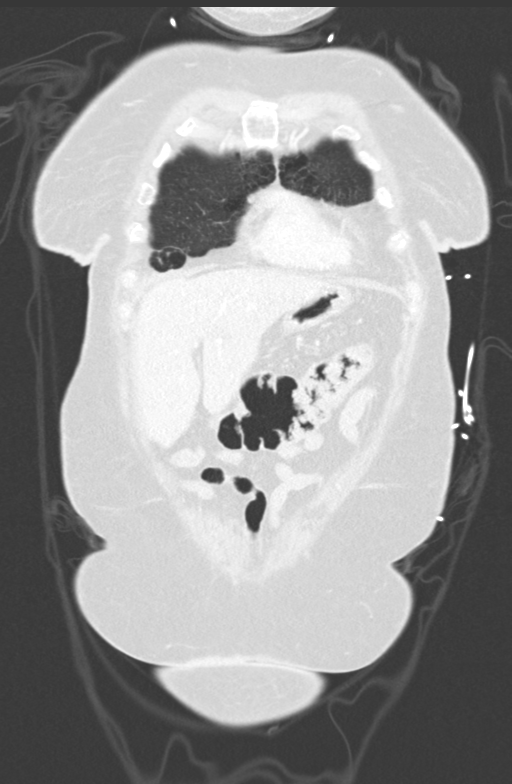
[im 57/143  lung]
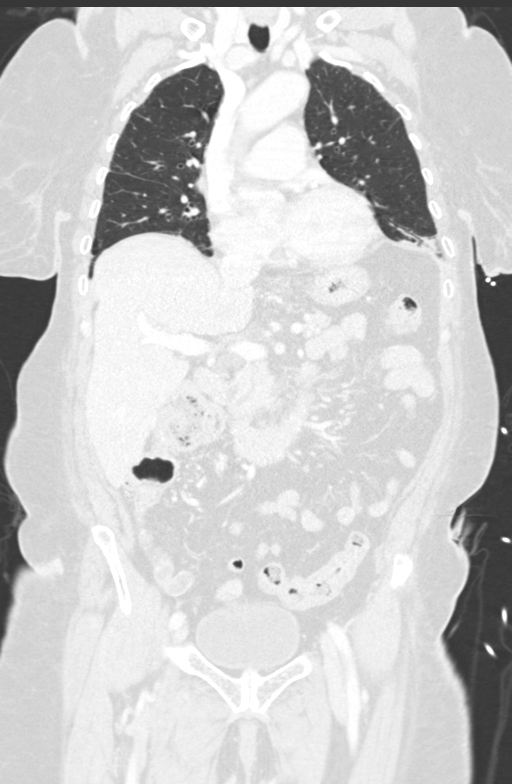
[im 86/143  lung]
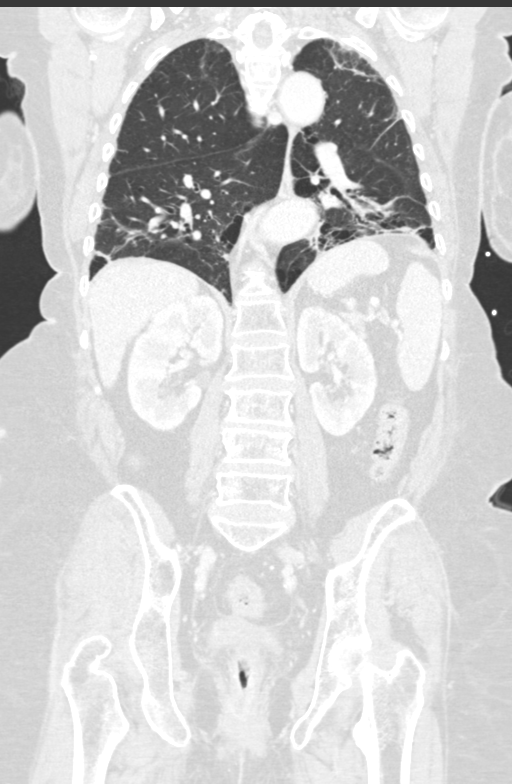

[12 of 36 positions shown; findings below may reference images not displayed]

FINDINGS: CT CHEST FINDINGS

Cardiovascular: Tortuous aorta with scattered atherosclerosis. Heart
is normal size.

Mediastinum/Nodes: No mediastinal, hilar, or axillary adenopathy.
Trachea and esophagus are unremarkable. Thyroid unremarkable.

Lungs/Pleura: Biapical scarring. Scarring in the lung bases with
bronchiectasis and paraseptal emphysema. No effusions.

Musculoskeletal: Chest wall soft tissues are unremarkable. Mild
compression fracture through the superior endplate of T11, chronic.

CT ABDOMEN PELVIS FINDINGS

Hepatobiliary: No focal liver abnormality is seen. Status post
cholecystectomy. No biliary dilatation.

Pancreas: No focal abnormality or ductal dilatation.

Spleen: No focal abnormality.  Normal size.

Adrenals/Urinary Tract: Small cyst in the midpole of the right
kidney. No hydronephrosis. Adrenal glands and urinary bladder
unremarkable.

Stomach/Bowel: Normal appendix. Stomach, large and small bowel
grossly unremarkable.

Vascular/Lymphatic: Aortic atherosclerosis. No enlarged abdominal or
pelvic lymph nodes.

Reproductive: Calcified fibroids in the uterus.  No adnexal mass.

Other: No free fluid or free air. Small umbilical hernia containing
fat.

Musculoskeletal: No acute bony abnormality. Chronic compression
fracture at L3 and L2.
IMPRESSION: No acute findings in the chest, abdomen or pelvis.

Biapical and bibasilar scarring. Bronchiectasis and paraseptal
emphysema in the lung bases.

Tortuous aorta.

Small umbilical hernia containing fat.
FINDINGS: The previously seen hypermetabolic left axillary lymph nodes are
stable in size when compared to prior PET CT. The largest measures 9
mm.

The previously seen hypermetabolic airspace disease in the lung
bases is similar to prior study. Overall appearance is not typical
of metastatic disease with infectious or inflammatory processes most
likely.
IMPRESSION: Overall, the left axillary lymph nodes and airspace disease in the
lung bases pelvis previously seen as hypermetabolic on prior PET CT
are unchanged.

*** End of Addendum ***
FINDINGS: CT CHEST FINDINGS

Cardiovascular: Tortuous aorta with scattered atherosclerosis. Heart
is normal size.

Mediastinum/Nodes: No mediastinal, hilar, or axillary adenopathy.
Trachea and esophagus are unremarkable. Thyroid unremarkable.

Lungs/Pleura: Biapical scarring. Scarring in the lung bases with
bronchiectasis and paraseptal emphysema. No effusions.

Musculoskeletal: Chest wall soft tissues are unremarkable. Mild
compression fracture through the superior endplate of T11, chronic.

CT ABDOMEN PELVIS FINDINGS

Hepatobiliary: No focal liver abnormality is seen. Status post
cholecystectomy. No biliary dilatation.

Pancreas: No focal abnormality or ductal dilatation.

Spleen: No focal abnormality.  Normal size.

Adrenals/Urinary Tract: Small cyst in the midpole of the right
kidney. No hydronephrosis. Adrenal glands and urinary bladder
unremarkable.

Stomach/Bowel: Normal appendix. Stomach, large and small bowel
grossly unremarkable.

Vascular/Lymphatic: Aortic atherosclerosis. No enlarged abdominal or
pelvic lymph nodes.

Reproductive: Calcified fibroids in the uterus.  No adnexal mass.

Other: No free fluid or free air. Small umbilical hernia containing
fat.

Musculoskeletal: No acute bony abnormality. Chronic compression
fracture at L3 and L2.
IMPRESSION: No acute findings in the chest, abdomen or pelvis.

Biapical and bibasilar scarring. Bronchiectasis and paraseptal
emphysema in the lung bases.

Tortuous aorta.

Small umbilical hernia containing fat.

## 2020-08-29 ENCOUNTER — Other Ambulatory Visit: Payer: Self-pay | Admitting: Internal Medicine

## 2020-08-29 DIAGNOSIS — I10 Essential (primary) hypertension: Secondary | ICD-10-CM

## 2020-10-30 ENCOUNTER — Other Ambulatory Visit: Payer: Self-pay | Admitting: Infectious Diseases

## 2022-01-31 NOTE — Progress Notes (Signed)
Patient seen as part of a multidisciplinary team visit.

## 2023-03-03 NOTE — Telephone Encounter (Signed)
TC

## 2023-04-09 NOTE — Telephone Encounter (Signed)
Telephone call
